# Patient Record
Sex: Female | Born: 1945 | Race: White | Hispanic: No | Smoking: Former smoker
Health system: Southern US, Community
[De-identification: ages and names within clinical notes are randomized; demographics above are authoritative.]

## PROBLEM LIST (undated history)

## (undated) DIAGNOSIS — I5022 Chronic systolic (congestive) heart failure: Secondary | ICD-10-CM

## (undated) DIAGNOSIS — T8859XA Other complications of anesthesia, initial encounter: Secondary | ICD-10-CM

## (undated) DIAGNOSIS — Z860100 Personal history of colon polyps, unspecified: Secondary | ICD-10-CM

## (undated) DIAGNOSIS — K8020 Calculus of gallbladder without cholecystitis without obstruction: Secondary | ICD-10-CM

## (undated) DIAGNOSIS — M62838 Other muscle spasm: Secondary | ICD-10-CM

## (undated) DIAGNOSIS — G8929 Other chronic pain: Secondary | ICD-10-CM

## (undated) DIAGNOSIS — C50912 Malignant neoplasm of unspecified site of left female breast: Secondary | ICD-10-CM

## (undated) DIAGNOSIS — G4733 Obstructive sleep apnea (adult) (pediatric): Secondary | ICD-10-CM

## (undated) DIAGNOSIS — M2550 Pain in unspecified joint: Secondary | ICD-10-CM

## (undated) DIAGNOSIS — I10 Essential (primary) hypertension: Secondary | ICD-10-CM

## (undated) DIAGNOSIS — E041 Nontoxic single thyroid nodule: Secondary | ICD-10-CM

## (undated) DIAGNOSIS — M8580 Other specified disorders of bone density and structure, unspecified site: Secondary | ICD-10-CM

## (undated) DIAGNOSIS — T4145XA Adverse effect of unspecified anesthetic, initial encounter: Secondary | ICD-10-CM

## (undated) DIAGNOSIS — E119 Type 2 diabetes mellitus without complications: Secondary | ICD-10-CM

## (undated) DIAGNOSIS — R351 Nocturia: Secondary | ICD-10-CM

## (undated) DIAGNOSIS — J189 Pneumonia, unspecified organism: Secondary | ICD-10-CM

## (undated) DIAGNOSIS — K219 Gastro-esophageal reflux disease without esophagitis: Secondary | ICD-10-CM

## (undated) DIAGNOSIS — I420 Dilated cardiomyopathy: Secondary | ICD-10-CM

## (undated) DIAGNOSIS — R0600 Dyspnea, unspecified: Secondary | ICD-10-CM

## (undated) DIAGNOSIS — J449 Chronic obstructive pulmonary disease, unspecified: Secondary | ICD-10-CM

## (undated) DIAGNOSIS — I4819 Other persistent atrial fibrillation: Secondary | ICD-10-CM

## (undated) DIAGNOSIS — M1990 Unspecified osteoarthritis, unspecified site: Secondary | ICD-10-CM

## (undated) DIAGNOSIS — J302 Other seasonal allergic rhinitis: Secondary | ICD-10-CM

## (undated) DIAGNOSIS — Z8709 Personal history of other diseases of the respiratory system: Secondary | ICD-10-CM

## (undated) DIAGNOSIS — E785 Hyperlipidemia, unspecified: Secondary | ICD-10-CM

## (undated) DIAGNOSIS — M549 Dorsalgia, unspecified: Secondary | ICD-10-CM

## (undated) HISTORY — DX: Personal history of colon polyps, unspecified: Z86.0100

## (undated) HISTORY — DX: Chronic systolic (congestive) heart failure: I50.22

## (undated) HISTORY — DX: Hyperlipidemia, unspecified: E78.5

## (undated) HISTORY — DX: Other specified disorders of bone density and structure, unspecified site: M85.80

## (undated) HISTORY — DX: Obstructive sleep apnea (adult) (pediatric): G47.33

## (undated) HISTORY — DX: Unspecified osteoarthritis, unspecified site: M19.90

## (undated) HISTORY — DX: Personal history of colonic polyps: Z86.010

## (undated) HISTORY — DX: Other persistent atrial fibrillation: I48.19

## (undated) HISTORY — PX: TUBAL LIGATION: SHX77

## (undated) HISTORY — PX: JOINT REPLACEMENT: SHX530

## (undated) HISTORY — PX: CATARACT EXTRACTION W/ INTRAOCULAR LENS  IMPLANT, BILATERAL: SHX1307

## (undated) HISTORY — DX: Calculus of gallbladder without cholecystitis without obstruction: K80.20

## (undated) HISTORY — PX: BACK SURGERY: SHX140

## (undated) HISTORY — DX: Dilated cardiomyopathy: I42.0

## (undated) HISTORY — DX: Essential (primary) hypertension: I10

## (undated) HISTORY — PX: COLONOSCOPY: SHX174

## (undated) HISTORY — DX: Chronic obstructive pulmonary disease, unspecified: J44.9

---

## 1999-06-04 ENCOUNTER — Other Ambulatory Visit: Admission: RE | Admit: 1999-06-04 | Discharge: 1999-06-04 | Payer: Self-pay

## 1999-10-13 ENCOUNTER — Other Ambulatory Visit: Admission: RE | Admit: 1999-10-13 | Discharge: 1999-10-13 | Payer: Self-pay | Admitting: Gynecology

## 2000-10-24 ENCOUNTER — Other Ambulatory Visit: Admission: RE | Admit: 2000-10-24 | Discharge: 2000-10-24 | Payer: Self-pay | Admitting: Gynecology

## 2000-10-25 DIAGNOSIS — C50912 Malignant neoplasm of unspecified site of left female breast: Secondary | ICD-10-CM

## 2000-10-25 HISTORY — DX: Malignant neoplasm of unspecified site of left female breast: C50.912

## 2000-10-25 HISTORY — PX: BREAST BIOPSY: SHX20

## 2000-10-25 HISTORY — PX: PORTA CATH INSERTION: CATH118285

## 2001-04-21 ENCOUNTER — Encounter: Admission: RE | Admit: 2001-04-21 | Discharge: 2001-04-21 | Payer: Self-pay | Admitting: Gynecology

## 2001-04-21 ENCOUNTER — Other Ambulatory Visit: Admission: RE | Admit: 2001-04-21 | Discharge: 2001-04-21 | Payer: Self-pay | Admitting: Gynecology

## 2001-04-21 ENCOUNTER — Encounter (INDEPENDENT_AMBULATORY_CARE_PROVIDER_SITE_OTHER): Payer: Self-pay | Admitting: Specialist

## 2001-04-21 ENCOUNTER — Encounter: Payer: Self-pay | Admitting: Gynecology

## 2001-04-24 ENCOUNTER — Other Ambulatory Visit: Admission: RE | Admit: 2001-04-24 | Discharge: 2001-04-24 | Payer: Self-pay | Admitting: Gynecology

## 2001-04-24 ENCOUNTER — Encounter (INDEPENDENT_AMBULATORY_CARE_PROVIDER_SITE_OTHER): Payer: Self-pay | Admitting: Specialist

## 2001-04-24 ENCOUNTER — Encounter: Payer: Self-pay | Admitting: Gynecology

## 2001-04-24 ENCOUNTER — Encounter: Admission: RE | Admit: 2001-04-24 | Discharge: 2001-04-24 | Payer: Self-pay | Admitting: Gynecology

## 2001-05-05 ENCOUNTER — Encounter: Admission: RE | Admit: 2001-05-05 | Discharge: 2001-05-05 | Payer: Self-pay | Admitting: General Surgery

## 2001-05-05 ENCOUNTER — Encounter: Payer: Self-pay | Admitting: General Surgery

## 2001-05-08 ENCOUNTER — Ambulatory Visit (HOSPITAL_BASED_OUTPATIENT_CLINIC_OR_DEPARTMENT_OTHER): Admission: RE | Admit: 2001-05-08 | Discharge: 2001-05-09 | Payer: Self-pay | Admitting: General Surgery

## 2001-05-08 ENCOUNTER — Encounter (INDEPENDENT_AMBULATORY_CARE_PROVIDER_SITE_OTHER): Payer: Self-pay | Admitting: *Deleted

## 2001-05-08 HISTORY — PX: MASTECTOMY PARTIAL / LUMPECTOMY W/ AXILLARY LYMPHADENECTOMY: SUR852

## 2001-05-17 ENCOUNTER — Ambulatory Visit: Admission: RE | Admit: 2001-05-17 | Discharge: 2001-05-24 | Payer: Self-pay | Admitting: Radiation Oncology

## 2001-05-25 ENCOUNTER — Ambulatory Visit: Admission: RE | Admit: 2001-05-25 | Discharge: 2001-08-23 | Payer: Self-pay | Admitting: Radiation Oncology

## 2001-05-29 ENCOUNTER — Encounter: Payer: Self-pay | Admitting: Hematology and Oncology

## 2001-05-29 ENCOUNTER — Ambulatory Visit (HOSPITAL_COMMUNITY): Admission: RE | Admit: 2001-05-29 | Discharge: 2001-05-29 | Payer: Self-pay | Admitting: Hematology and Oncology

## 2001-05-30 ENCOUNTER — Ambulatory Visit (HOSPITAL_COMMUNITY): Admission: RE | Admit: 2001-05-30 | Discharge: 2001-05-30 | Payer: Self-pay | Admitting: Hematology and Oncology

## 2001-05-30 ENCOUNTER — Encounter: Payer: Self-pay | Admitting: Hematology and Oncology

## 2001-05-31 ENCOUNTER — Encounter: Admission: RE | Admit: 2001-05-31 | Discharge: 2001-05-31 | Payer: Self-pay | Admitting: Hematology and Oncology

## 2001-05-31 ENCOUNTER — Encounter: Payer: Self-pay | Admitting: Hematology and Oncology

## 2001-06-19 ENCOUNTER — Encounter: Payer: Self-pay | Admitting: General Surgery

## 2001-06-19 ENCOUNTER — Ambulatory Visit (HOSPITAL_BASED_OUTPATIENT_CLINIC_OR_DEPARTMENT_OTHER): Admission: RE | Admit: 2001-06-19 | Discharge: 2001-06-19 | Payer: Self-pay | Admitting: General Surgery

## 2001-07-10 ENCOUNTER — Encounter: Payer: Self-pay | Admitting: Hematology and Oncology

## 2001-07-10 ENCOUNTER — Ambulatory Visit (HOSPITAL_COMMUNITY): Admission: RE | Admit: 2001-07-10 | Discharge: 2001-07-10 | Payer: Self-pay | Admitting: Hematology and Oncology

## 2001-07-11 ENCOUNTER — Ambulatory Visit (HOSPITAL_COMMUNITY): Admission: RE | Admit: 2001-07-11 | Discharge: 2001-07-11 | Payer: Self-pay | Admitting: Hematology and Oncology

## 2001-07-11 ENCOUNTER — Encounter: Payer: Self-pay | Admitting: Hematology and Oncology

## 2001-09-04 ENCOUNTER — Encounter: Payer: Self-pay | Admitting: Hematology and Oncology

## 2001-09-04 ENCOUNTER — Ambulatory Visit (HOSPITAL_COMMUNITY): Admission: RE | Admit: 2001-09-04 | Discharge: 2001-09-04 | Payer: Self-pay | Admitting: Hematology and Oncology

## 2001-09-07 ENCOUNTER — Ambulatory Visit: Admission: RE | Admit: 2001-09-07 | Discharge: 2001-12-06 | Payer: Self-pay | Admitting: Radiation Oncology

## 2001-09-14 ENCOUNTER — Encounter: Payer: Self-pay | Admitting: Hematology and Oncology

## 2001-09-14 ENCOUNTER — Ambulatory Visit (HOSPITAL_COMMUNITY): Admission: RE | Admit: 2001-09-14 | Discharge: 2001-09-14 | Payer: Self-pay | Admitting: Hematology and Oncology

## 2001-09-15 ENCOUNTER — Encounter: Admission: RE | Admit: 2001-09-15 | Discharge: 2001-09-15 | Payer: Self-pay | Admitting: Radiation Oncology

## 2001-09-20 ENCOUNTER — Encounter: Payer: Self-pay | Admitting: Radiation Oncology

## 2001-09-20 ENCOUNTER — Encounter (INDEPENDENT_AMBULATORY_CARE_PROVIDER_SITE_OTHER): Payer: Self-pay | Admitting: *Deleted

## 2001-09-20 ENCOUNTER — Encounter: Admission: RE | Admit: 2001-09-20 | Discharge: 2001-09-20 | Payer: Self-pay | Admitting: Radiation Oncology

## 2001-10-20 ENCOUNTER — Encounter: Admission: RE | Admit: 2001-10-20 | Discharge: 2001-10-20 | Payer: Self-pay | Admitting: Radiation Oncology

## 2001-10-25 HISTORY — PX: AXILLARY LYMPH NODE DISSECTION: SHX5229

## 2001-10-25 HISTORY — PX: PORT-A-CATH REMOVAL: SHX5289

## 2001-11-02 ENCOUNTER — Encounter: Payer: Self-pay | Admitting: General Surgery

## 2001-11-02 ENCOUNTER — Ambulatory Visit (HOSPITAL_BASED_OUTPATIENT_CLINIC_OR_DEPARTMENT_OTHER): Admission: RE | Admit: 2001-11-02 | Discharge: 2001-11-02 | Payer: Self-pay | Admitting: General Surgery

## 2001-11-02 ENCOUNTER — Encounter (INDEPENDENT_AMBULATORY_CARE_PROVIDER_SITE_OTHER): Payer: Self-pay | Admitting: Specialist

## 2001-11-02 ENCOUNTER — Encounter: Admission: RE | Admit: 2001-11-02 | Discharge: 2001-11-02 | Payer: Self-pay | Admitting: General Surgery

## 2001-11-07 ENCOUNTER — Other Ambulatory Visit: Admission: RE | Admit: 2001-11-07 | Discharge: 2001-11-07 | Payer: Self-pay | Admitting: Gynecology

## 2001-12-15 ENCOUNTER — Encounter: Admission: RE | Admit: 2001-12-15 | Discharge: 2002-01-04 | Payer: Self-pay | Admitting: Radiation Oncology

## 2002-01-10 ENCOUNTER — Ambulatory Visit: Admission: RE | Admit: 2002-01-10 | Discharge: 2002-01-10 | Payer: Self-pay | Admitting: Radiation Oncology

## 2002-01-17 ENCOUNTER — Ambulatory Visit (HOSPITAL_BASED_OUTPATIENT_CLINIC_OR_DEPARTMENT_OTHER): Admission: RE | Admit: 2002-01-17 | Discharge: 2002-01-17 | Payer: Self-pay | Admitting: Plastic Surgery

## 2002-01-17 ENCOUNTER — Encounter (INDEPENDENT_AMBULATORY_CARE_PROVIDER_SITE_OTHER): Payer: Self-pay | Admitting: *Deleted

## 2002-03-05 ENCOUNTER — Encounter: Admission: RE | Admit: 2002-03-05 | Discharge: 2002-03-05 | Payer: Self-pay | Admitting: General Surgery

## 2002-03-05 ENCOUNTER — Encounter: Payer: Self-pay | Admitting: General Surgery

## 2002-06-17 ENCOUNTER — Inpatient Hospital Stay (HOSPITAL_COMMUNITY): Admission: EM | Admit: 2002-06-17 | Discharge: 2002-06-19 | Payer: Self-pay | Admitting: Emergency Medicine

## 2002-06-17 ENCOUNTER — Encounter: Payer: Self-pay | Admitting: Internal Medicine

## 2002-07-13 ENCOUNTER — Encounter: Payer: Self-pay | Admitting: Internal Medicine

## 2002-09-17 ENCOUNTER — Encounter: Admission: RE | Admit: 2002-09-17 | Discharge: 2002-09-17 | Payer: Self-pay | Admitting: General Surgery

## 2002-09-17 ENCOUNTER — Encounter: Payer: Self-pay | Admitting: General Surgery

## 2002-10-25 HISTORY — PX: LAPAROSCOPIC CHOLECYSTECTOMY: SUR755

## 2002-12-26 ENCOUNTER — Encounter (INDEPENDENT_AMBULATORY_CARE_PROVIDER_SITE_OTHER): Payer: Self-pay | Admitting: Specialist

## 2002-12-26 ENCOUNTER — Ambulatory Visit (HOSPITAL_COMMUNITY): Admission: RE | Admit: 2002-12-26 | Discharge: 2002-12-27 | Payer: Self-pay

## 2003-01-14 ENCOUNTER — Other Ambulatory Visit: Admission: RE | Admit: 2003-01-14 | Discharge: 2003-01-14 | Payer: Self-pay | Admitting: Gynecology

## 2003-09-20 ENCOUNTER — Encounter: Admission: RE | Admit: 2003-09-20 | Discharge: 2003-09-20 | Payer: Self-pay | Admitting: Oncology

## 2004-01-15 ENCOUNTER — Other Ambulatory Visit: Admission: RE | Admit: 2004-01-15 | Discharge: 2004-01-15 | Payer: Self-pay | Admitting: Gynecology

## 2004-09-01 ENCOUNTER — Ambulatory Visit: Payer: Self-pay | Admitting: Internal Medicine

## 2004-09-04 ENCOUNTER — Ambulatory Visit: Payer: Self-pay | Admitting: Oncology

## 2004-09-21 ENCOUNTER — Encounter: Admission: RE | Admit: 2004-09-21 | Discharge: 2004-09-21 | Payer: Self-pay | Admitting: Gynecology

## 2004-10-25 HISTORY — PX: TOTAL KNEE ARTHROPLASTY: SHX125

## 2004-12-23 ENCOUNTER — Ambulatory Visit: Payer: Self-pay | Admitting: Internal Medicine

## 2005-01-19 ENCOUNTER — Other Ambulatory Visit: Admission: RE | Admit: 2005-01-19 | Discharge: 2005-01-19 | Payer: Self-pay | Admitting: Gynecology

## 2005-03-04 ENCOUNTER — Ambulatory Visit: Payer: Self-pay | Admitting: Oncology

## 2005-03-12 ENCOUNTER — Ambulatory Visit (HOSPITAL_COMMUNITY): Admission: RE | Admit: 2005-03-12 | Discharge: 2005-03-12 | Payer: Self-pay | Admitting: Oncology

## 2005-05-05 ENCOUNTER — Ambulatory Visit: Payer: Self-pay | Admitting: Internal Medicine

## 2005-05-11 ENCOUNTER — Ambulatory Visit: Payer: Self-pay | Admitting: Internal Medicine

## 2005-09-09 ENCOUNTER — Ambulatory Visit: Payer: Self-pay | Admitting: Internal Medicine

## 2005-09-21 ENCOUNTER — Ambulatory Visit: Payer: Self-pay | Admitting: Oncology

## 2005-09-23 ENCOUNTER — Inpatient Hospital Stay (HOSPITAL_COMMUNITY): Admission: RE | Admit: 2005-09-23 | Discharge: 2005-09-26 | Payer: Self-pay | Admitting: Orthopedic Surgery

## 2005-10-15 ENCOUNTER — Encounter: Admission: RE | Admit: 2005-10-15 | Discharge: 2005-10-15 | Payer: Self-pay | Admitting: Oncology

## 2005-11-22 ENCOUNTER — Encounter: Admission: RE | Admit: 2005-11-22 | Discharge: 2005-11-22 | Payer: Self-pay | Admitting: Oncology

## 2005-11-22 ENCOUNTER — Encounter (INDEPENDENT_AMBULATORY_CARE_PROVIDER_SITE_OTHER): Payer: Self-pay | Admitting: Specialist

## 2006-01-10 ENCOUNTER — Ambulatory Visit: Payer: Self-pay | Admitting: Internal Medicine

## 2006-02-02 ENCOUNTER — Other Ambulatory Visit: Admission: RE | Admit: 2006-02-02 | Discharge: 2006-02-02 | Payer: Self-pay | Admitting: Gynecology

## 2006-04-12 ENCOUNTER — Ambulatory Visit: Payer: Self-pay | Admitting: Oncology

## 2006-04-14 LAB — CBC WITH DIFFERENTIAL/PLATELET
BASO%: 0.5 % (ref 0.0–2.0)
Basophils Absolute: 0 10*3/uL (ref 0.0–0.1)
EOS%: 2.4 % (ref 0.0–7.0)
HCT: 38.5 % (ref 34.8–46.6)
HGB: 13.4 g/dL (ref 11.6–15.9)
MCH: 30 pg (ref 26.0–34.0)
MCHC: 34.7 g/dL (ref 32.0–36.0)
MCV: 86.5 fL (ref 81.0–101.0)
MONO%: 7.3 % (ref 0.0–13.0)
NEUT%: 67.3 % (ref 39.6–76.8)

## 2006-04-14 LAB — COMPREHENSIVE METABOLIC PANEL
ALT: 11 U/L (ref 0–40)
AST: 12 U/L (ref 0–37)
Alkaline Phosphatase: 101 U/L (ref 39–117)
BUN: 12 mg/dL (ref 6–23)
Creatinine, Ser: 0.77 mg/dL (ref 0.40–1.20)
Total Bilirubin: 0.6 mg/dL (ref 0.3–1.2)

## 2006-04-14 LAB — CANCER ANTIGEN 27.29: CA 27.29: 12 U/mL (ref 0–39)

## 2006-05-09 ENCOUNTER — Ambulatory Visit: Payer: Self-pay | Admitting: Internal Medicine

## 2006-05-17 ENCOUNTER — Ambulatory Visit: Payer: Self-pay | Admitting: Internal Medicine

## 2006-05-31 ENCOUNTER — Encounter: Admission: RE | Admit: 2006-05-31 | Discharge: 2006-05-31 | Payer: Self-pay | Admitting: General Surgery

## 2006-10-11 ENCOUNTER — Ambulatory Visit: Payer: Self-pay | Admitting: Oncology

## 2006-10-14 LAB — COMPREHENSIVE METABOLIC PANEL
ALT: 12 U/L (ref 0–35)
Albumin: 4.5 g/dL (ref 3.5–5.2)
Alkaline Phosphatase: 104 U/L (ref 39–117)
CO2: 29 mEq/L (ref 19–32)
Glucose, Bld: 117 mg/dL — ABNORMAL HIGH (ref 70–99)
Potassium: 3.6 mEq/L (ref 3.5–5.3)
Sodium: 141 mEq/L (ref 135–145)
Total Bilirubin: 0.4 mg/dL (ref 0.3–1.2)
Total Protein: 7 g/dL (ref 6.0–8.3)

## 2006-10-14 LAB — CBC WITH DIFFERENTIAL/PLATELET
BASO%: 0.6 % (ref 0.0–2.0)
Basophils Absolute: 0.1 10*3/uL (ref 0.0–0.1)
EOS%: 2.2 % (ref 0.0–7.0)
Eosinophils Absolute: 0.2 10*3/uL (ref 0.0–0.5)
HCT: 38.2 % (ref 34.8–46.6)
HGB: 13.1 g/dL (ref 11.6–15.9)
LYMPH%: 25.6 % (ref 14.0–48.0)
MCH: 30.1 pg (ref 26.0–34.0)
MCHC: 34.2 g/dL (ref 32.0–36.0)
MCV: 88 fL (ref 81.0–101.0)
MONO#: 0.7 10*3/uL (ref 0.1–0.9)
MONO%: 8.6 % (ref 0.0–13.0)
NEUT#: 5.4 10*3/uL (ref 1.5–6.5)
NEUT%: 63 % (ref 39.6–76.8)
Platelets: 409 10*3/uL — ABNORMAL HIGH (ref 145–400)
RBC: 4.34 10*6/uL (ref 3.70–5.32)
RDW: 12.5 % (ref 11.3–14.5)
WBC: 8.5 10*3/uL (ref 3.9–10.0)
lymph#: 2.2 10*3/uL (ref 0.9–3.3)

## 2006-10-14 LAB — CANCER ANTIGEN 27.29: CA 27.29: 10 U/mL (ref 0–39)

## 2006-10-14 LAB — LACTATE DEHYDROGENASE: LDH: 143 U/L (ref 94–250)

## 2006-10-19 ENCOUNTER — Encounter: Admission: RE | Admit: 2006-10-19 | Discharge: 2006-10-19 | Payer: Self-pay | Admitting: Oncology

## 2006-11-07 ENCOUNTER — Ambulatory Visit: Payer: Self-pay | Admitting: Internal Medicine

## 2007-02-07 ENCOUNTER — Other Ambulatory Visit: Admission: RE | Admit: 2007-02-07 | Discharge: 2007-02-07 | Payer: Self-pay | Admitting: Gynecology

## 2007-05-03 DIAGNOSIS — E785 Hyperlipidemia, unspecified: Secondary | ICD-10-CM | POA: Insufficient documentation

## 2007-05-03 DIAGNOSIS — J449 Chronic obstructive pulmonary disease, unspecified: Secondary | ICD-10-CM | POA: Insufficient documentation

## 2007-05-03 DIAGNOSIS — Z86010 Personal history of colonic polyps: Secondary | ICD-10-CM

## 2007-05-03 DIAGNOSIS — Z853 Personal history of malignant neoplasm of breast: Secondary | ICD-10-CM

## 2007-05-03 DIAGNOSIS — I10 Essential (primary) hypertension: Secondary | ICD-10-CM | POA: Insufficient documentation

## 2007-05-10 ENCOUNTER — Ambulatory Visit: Payer: Self-pay | Admitting: Internal Medicine

## 2007-05-10 LAB — CONVERTED CEMR LAB
ALT: 15 units/L (ref 0–35)
AST: 17 units/L (ref 0–37)
Albumin: 3.8 g/dL (ref 3.5–5.2)
Alkaline Phosphatase: 82 units/L (ref 39–117)
BUN: 15 mg/dL (ref 6–23)
Basophils Absolute: 0 10*3/uL (ref 0.0–0.1)
Basophils Relative: 0.4 % (ref 0.0–1.0)
Bilirubin, Direct: 0.1 mg/dL (ref 0.0–0.3)
CO2: 30 meq/L (ref 19–32)
Calcium: 9.3 mg/dL (ref 8.4–10.5)
Chloride: 106 meq/L (ref 96–112)
Cholesterol: 170 mg/dL (ref 0–200)
Creatinine, Ser: 0.6 mg/dL (ref 0.4–1.2)
Eosinophils Absolute: 0.2 10*3/uL (ref 0.0–0.6)
Eosinophils Relative: 2.7 % (ref 0.0–5.0)
GFR calc Af Amer: 131 mL/min
GFR calc non Af Amer: 108 mL/min
Glucose, Bld: 90 mg/dL (ref 70–99)
HCT: 38.4 % (ref 36.0–46.0)
HDL: 55.5 mg/dL (ref >39.0)
Hemoglobin: 13.3 g/dL (ref 12.0–15.0)
LDL Cholesterol: 92 mg/dL (ref 0–99)
Lymphocytes Relative: 23.5 % (ref 12.0–46.0)
MCHC: 34.7 g/dL (ref 30.0–36.0)
MCV: 88.7 fL (ref 78.0–100.0)
Monocytes Absolute: 0.6 10*3/uL (ref 0.2–0.7)
Monocytes Relative: 7.7 % (ref 3.0–11.0)
Neutro Abs: 4.9 10*3/uL (ref 1.4–7.7)
Neutrophils Relative %: 65.7 % (ref 43.0–77.0)
Platelets: 393 10*3/uL (ref 150–400)
Potassium: 3.8 meq/L (ref 3.5–5.1)
RBC: 4.33 M/uL (ref 3.87–5.11)
RDW: 12.4 % (ref 11.5–14.6)
Sodium: 142 meq/L (ref 135–145)
TSH: 0.88 microintl units/mL (ref 0.35–5.50)
Total Bilirubin: 0.8 mg/dL (ref 0.3–1.2)
Total CHOL/HDL Ratio: 3.1
Total Protein: 6.8 g/dL (ref 6.0–8.3)
Triglycerides: 111 mg/dL (ref 0–149)
VLDL: 22 mg/dL (ref 0–40)
WBC: 7.4 10*3/uL (ref 4.5–10.5)

## 2007-05-16 ENCOUNTER — Encounter (INDEPENDENT_AMBULATORY_CARE_PROVIDER_SITE_OTHER): Payer: Self-pay

## 2007-05-17 ENCOUNTER — Ambulatory Visit: Payer: Self-pay | Admitting: Internal Medicine

## 2007-05-17 DIAGNOSIS — M8580 Other specified disorders of bone density and structure, unspecified site: Secondary | ICD-10-CM

## 2007-07-11 ENCOUNTER — Ambulatory Visit: Payer: Self-pay | Admitting: Internal Medicine

## 2007-07-25 ENCOUNTER — Encounter: Payer: Self-pay | Admitting: Internal Medicine

## 2007-07-25 ENCOUNTER — Ambulatory Visit: Payer: Self-pay | Admitting: Internal Medicine

## 2007-10-11 ENCOUNTER — Ambulatory Visit: Payer: Self-pay | Admitting: Oncology

## 2007-10-13 LAB — COMPREHENSIVE METABOLIC PANEL
AST: 13 U/L (ref 0–37)
Albumin: 4.3 g/dL (ref 3.5–5.2)
Alkaline Phosphatase: 97 U/L (ref 39–117)
Potassium: 3.9 mEq/L (ref 3.5–5.3)
Sodium: 139 mEq/L (ref 135–145)
Total Bilirubin: 0.6 mg/dL (ref 0.3–1.2)
Total Protein: 7.2 g/dL (ref 6.0–8.3)

## 2007-10-13 LAB — CBC WITH DIFFERENTIAL/PLATELET
EOS%: 2.6 % (ref 0.0–7.0)
LYMPH%: 29.4 % (ref 14.0–48.0)
MCH: 29.4 pg (ref 26.0–34.0)
MCHC: 34.4 g/dL (ref 32.0–36.0)
MCV: 85.3 fL (ref 81.0–101.0)
MONO%: 4.9 % (ref 0.0–13.0)
Platelets: 379 10*3/uL (ref 145–400)
RBC: 4.35 10*6/uL (ref 3.70–5.32)
RDW: 10.4 % — ABNORMAL LOW (ref 11.3–14.5)

## 2007-10-13 LAB — CANCER ANTIGEN 27.29: CA 27.29: 12 U/mL (ref 0–39)

## 2007-10-20 ENCOUNTER — Encounter: Payer: Self-pay | Admitting: Internal Medicine

## 2007-10-23 ENCOUNTER — Encounter: Admission: RE | Admit: 2007-10-23 | Discharge: 2007-10-23 | Payer: Self-pay | Admitting: Oncology

## 2007-11-28 ENCOUNTER — Ambulatory Visit: Payer: Self-pay | Admitting: Internal Medicine

## 2007-12-06 ENCOUNTER — Encounter: Payer: Self-pay | Admitting: Internal Medicine

## 2007-12-06 ENCOUNTER — Ambulatory Visit: Payer: Self-pay

## 2007-12-11 ENCOUNTER — Telehealth: Payer: Self-pay | Admitting: Internal Medicine

## 2008-02-28 ENCOUNTER — Ambulatory Visit: Payer: Self-pay | Admitting: Internal Medicine

## 2008-02-28 DIAGNOSIS — M549 Dorsalgia, unspecified: Secondary | ICD-10-CM

## 2008-02-28 DIAGNOSIS — G8929 Other chronic pain: Secondary | ICD-10-CM

## 2008-03-03 ENCOUNTER — Encounter: Admission: RE | Admit: 2008-03-03 | Discharge: 2008-03-03 | Payer: Self-pay | Admitting: Internal Medicine

## 2008-03-06 ENCOUNTER — Encounter: Payer: Self-pay | Admitting: Internal Medicine

## 2008-04-30 ENCOUNTER — Encounter: Payer: Self-pay | Admitting: Internal Medicine

## 2008-05-17 ENCOUNTER — Ambulatory Visit: Payer: Self-pay | Admitting: Internal Medicine

## 2008-05-17 LAB — CONVERTED CEMR LAB
ALT: 19 units/L (ref 0–35)
AST: 19 units/L (ref 0–37)
Albumin: 3.9 g/dL (ref 3.5–5.2)
Alkaline Phosphatase: 93 units/L (ref 39–117)
BUN: 14 mg/dL (ref 6–23)
Basophils Absolute: 0 10*3/uL (ref 0.0–0.1)
Basophils Relative: 0.4 % (ref 0.0–3.0)
Bilirubin Urine: NEGATIVE
Bilirubin, Direct: 0.1 mg/dL (ref 0.0–0.3)
CO2: 28 meq/L (ref 19–32)
Calcium: 9.4 mg/dL (ref 8.4–10.5)
Chloride: 103 meq/L (ref 96–112)
Cholesterol: 167 mg/dL (ref 0–200)
Creatinine, Ser: 0.7 mg/dL (ref 0.4–1.2)
Eosinophils Absolute: 0.2 10*3/uL (ref 0.0–0.7)
Eosinophils Relative: 2 % (ref 0.0–5.0)
GFR calc Af Amer: 109 mL/min
GFR calc non Af Amer: 90 mL/min
Glucose, Bld: 116 mg/dL — ABNORMAL HIGH (ref 70–99)
Glucose, Urine, Semiquant: NEGATIVE
HCT: 40.5 % (ref 36.0–46.0)
HDL: 46.7 mg/dL (ref >39.0)
Hemoglobin: 13.6 g/dL (ref 12.0–15.0)
Ketones, urine, test strip: NEGATIVE
LDL Cholesterol: 102 mg/dL — ABNORMAL HIGH (ref 0–99)
Lymphocytes Relative: 24 % (ref 12.0–46.0)
MCHC: 33.5 g/dL (ref 30.0–36.0)
MCV: 90.2 fL (ref 78.0–100.0)
Monocytes Absolute: 0.7 10*3/uL (ref 0.1–1.0)
Monocytes Relative: 8.3 % (ref 3.0–12.0)
Neutro Abs: 5.8 10*3/uL (ref 1.4–7.7)
Neutrophils Relative %: 65.3 % (ref 43.0–77.0)
Nitrite: NEGATIVE
Platelets: 356 10*3/uL (ref 150–400)
Potassium: 3.8 meq/L (ref 3.5–5.1)
RBC: 4.49 M/uL (ref 3.87–5.11)
RDW: 12.7 % (ref 11.5–14.6)
Sodium: 143 meq/L (ref 135–145)
Specific Gravity, Urine: >=1.03
TSH: 1.28 microintl units/mL (ref 0.35–5.50)
Total Bilirubin: 0.8 mg/dL (ref 0.3–1.2)
Total CHOL/HDL Ratio: 3.6
Total Protein: 7.1 g/dL (ref 6.0–8.3)
Triglycerides: 93 mg/dL (ref 0–149)
Urobilinogen, UA: 0.2
VLDL: 19 mg/dL (ref 0–40)
WBC: 8.8 10*3/uL (ref 4.5–10.5)
pH: 5.5

## 2008-05-23 ENCOUNTER — Encounter: Payer: Self-pay | Admitting: Internal Medicine

## 2008-05-24 ENCOUNTER — Ambulatory Visit: Payer: Self-pay | Admitting: Internal Medicine

## 2008-07-04 ENCOUNTER — Telehealth: Payer: Self-pay | Admitting: Internal Medicine

## 2008-07-05 ENCOUNTER — Ambulatory Visit: Payer: Self-pay | Admitting: Internal Medicine

## 2008-07-05 DIAGNOSIS — J069 Acute upper respiratory infection, unspecified: Secondary | ICD-10-CM | POA: Insufficient documentation

## 2008-08-14 ENCOUNTER — Ambulatory Visit: Payer: Self-pay | Admitting: Internal Medicine

## 2008-10-23 ENCOUNTER — Encounter: Admission: RE | Admit: 2008-10-23 | Discharge: 2008-10-23 | Payer: Self-pay | Admitting: Oncology

## 2008-10-24 ENCOUNTER — Encounter: Admission: RE | Admit: 2008-10-24 | Discharge: 2008-10-24 | Payer: Self-pay | Admitting: Oncology

## 2008-11-20 ENCOUNTER — Ambulatory Visit: Payer: Self-pay | Admitting: Oncology

## 2008-12-10 LAB — CBC WITH DIFFERENTIAL/PLATELET
Eosinophils Absolute: 0.1 10*3/uL (ref 0.0–0.5)
MONO#: 0.5 10*3/uL (ref 0.1–0.9)
MONO%: 8.2 % (ref 0.0–13.0)
NEUT#: 4.6 10*3/uL (ref 1.5–6.5)
RBC: 4.48 10*6/uL (ref 3.70–5.32)
RDW: 13 % (ref 11.3–14.5)
WBC: 6.6 10*3/uL (ref 3.9–10.0)
lymph#: 1.3 10*3/uL (ref 0.9–3.3)

## 2008-12-11 LAB — COMPREHENSIVE METABOLIC PANEL
Albumin: 4.4 g/dL (ref 3.5–5.2)
Alkaline Phosphatase: 92 U/L (ref 39–117)
CO2: 26 mEq/L (ref 19–32)
Glucose, Bld: 117 mg/dL — ABNORMAL HIGH (ref 70–99)
Potassium: 3.5 mEq/L (ref 3.5–5.3)
Sodium: 140 mEq/L (ref 135–145)
Total Protein: 7 g/dL (ref 6.0–8.3)

## 2008-12-11 LAB — CANCER ANTIGEN 27.29: CA 27.29: 17 U/mL (ref 0–39)

## 2008-12-17 ENCOUNTER — Encounter: Payer: Self-pay | Admitting: Internal Medicine

## 2009-05-22 ENCOUNTER — Ambulatory Visit: Payer: Self-pay | Admitting: Internal Medicine

## 2009-05-22 LAB — CONVERTED CEMR LAB
ALT: 16 units/L (ref 0–35)
AST: 19 units/L (ref 0–37)
Albumin: 4 g/dL (ref 3.5–5.2)
Alkaline Phosphatase: 86 units/L (ref 39–117)
BUN: 11 mg/dL (ref 6–23)
Basophils Absolute: 0.1 10*3/uL (ref 0.0–0.1)
Basophils Relative: 0.9 % (ref 0.0–3.0)
Bilirubin, Direct: 0 mg/dL (ref 0.0–0.3)
CO2: 30 meq/L (ref 19–32)
Calcium: 9.4 mg/dL (ref 8.4–10.5)
Chloride: 104 meq/L (ref 96–112)
Cholesterol: 186 mg/dL (ref 0–200)
Creatinine, Ser: 0.5 mg/dL (ref 0.4–1.2)
Eosinophils Absolute: 0.2 10*3/uL (ref 0.0–0.7)
Eosinophils Relative: 3.3 % (ref 0.0–5.0)
GFR calc non Af Amer: 132.18 mL/min (ref >60)
Glucose, Bld: 100 mg/dL — ABNORMAL HIGH (ref 70–99)
HCT: 38.4 % (ref 36.0–46.0)
HDL: 42.8 mg/dL (ref >39.00)
Hemoglobin: 13.1 g/dL (ref 12.0–15.0)
LDL Cholesterol: 118 mg/dL — ABNORMAL HIGH (ref 0–99)
Lymphocytes Relative: 27.2 % (ref 12.0–46.0)
Lymphs Abs: 1.9 10*3/uL (ref 0.7–4.0)
MCHC: 34.2 g/dL (ref 30.0–36.0)
MCV: 89.6 fL (ref 78.0–100.0)
Monocytes Absolute: 0.5 10*3/uL (ref 0.1–1.0)
Monocytes Relative: 7.4 % (ref 3.0–12.0)
Neutro Abs: 4.2 10*3/uL (ref 1.4–7.7)
Neutrophils Relative %: 61.2 % (ref 43.0–77.0)
Platelets: 357 10*3/uL (ref 150.0–400.0)
Potassium: 3.8 meq/L (ref 3.5–5.1)
RBC: 4.29 M/uL (ref 3.87–5.11)
RDW: 12.7 % (ref 11.5–14.6)
Sodium: 140 meq/L (ref 135–145)
TSH: 1 microintl units/mL (ref 0.35–5.50)
Total Bilirubin: 0.7 mg/dL (ref 0.3–1.2)
Total CHOL/HDL Ratio: 4
Total Protein: 7.1 g/dL (ref 6.0–8.3)
Triglycerides: 124 mg/dL (ref 0.0–149.0)
VLDL: 24.8 mg/dL (ref 0.0–40.0)
WBC: 6.9 10*3/uL (ref 4.5–10.5)

## 2009-05-29 ENCOUNTER — Ambulatory Visit: Payer: Self-pay | Admitting: Internal Medicine

## 2009-10-29 ENCOUNTER — Encounter: Admission: RE | Admit: 2009-10-29 | Discharge: 2009-10-29 | Payer: Self-pay | Admitting: Oncology

## 2009-12-08 ENCOUNTER — Ambulatory Visit: Payer: Self-pay | Admitting: Oncology

## 2009-12-10 LAB — CBC WITH DIFFERENTIAL/PLATELET
BASO%: 0.7 % (ref 0.0–2.0)
EOS%: 2.5 % (ref 0.0–7.0)
MCH: 30.6 pg (ref 25.1–34.0)
MCHC: 34.4 g/dL (ref 31.5–36.0)
MCV: 89 fL (ref 79.5–101.0)
MONO%: 7.1 % (ref 0.0–14.0)
RBC: 4.19 10*6/uL (ref 3.70–5.45)
RDW: 12.6 % (ref 11.2–14.5)
lymph#: 1.4 10*3/uL (ref 0.9–3.3)

## 2009-12-10 LAB — COMPREHENSIVE METABOLIC PANEL
ALT: 15 U/L (ref 0–35)
AST: 15 U/L (ref 0–37)
Albumin: 4.3 g/dL (ref 3.5–5.2)
Alkaline Phosphatase: 77 U/L (ref 39–117)
BUN: 18 mg/dL (ref 6–23)
Chloride: 105 mEq/L (ref 96–112)
Potassium: 3.7 mEq/L (ref 3.5–5.3)
Sodium: 141 mEq/L (ref 135–145)

## 2009-12-10 LAB — VITAMIN D 25 HYDROXY (VIT D DEFICIENCY, FRACTURES): Vit D, 25-Hydroxy: 28 ng/mL — ABNORMAL LOW (ref 30–89)

## 2010-01-06 ENCOUNTER — Encounter: Payer: Self-pay | Admitting: Internal Medicine

## 2010-01-08 ENCOUNTER — Ambulatory Visit: Payer: Self-pay | Admitting: Internal Medicine

## 2010-01-08 DIAGNOSIS — M549 Dorsalgia, unspecified: Secondary | ICD-10-CM | POA: Insufficient documentation

## 2010-03-11 ENCOUNTER — Telehealth: Payer: Self-pay | Admitting: Internal Medicine

## 2010-05-27 ENCOUNTER — Ambulatory Visit: Payer: Self-pay | Admitting: Internal Medicine

## 2010-05-27 LAB — CONVERTED CEMR LAB
ALT: 17 units/L (ref 0–35)
AST: 17 units/L (ref 0–37)
Albumin: 4.1 g/dL (ref 3.5–5.2)
Alkaline Phosphatase: 84 units/L (ref 39–117)
BUN: 20 mg/dL (ref 6–23)
Basophils Absolute: 0 10*3/uL (ref 0.0–0.1)
Basophils Relative: 0.6 % (ref 0.0–3.0)
Bilirubin Urine: NEGATIVE
Bilirubin, Direct: 0.2 mg/dL (ref 0.0–0.3)
CO2: 29 meq/L (ref 19–32)
Calcium: 9.7 mg/dL (ref 8.4–10.5)
Chloride: 104 meq/L (ref 96–112)
Cholesterol: 152 mg/dL (ref 0–200)
Creatinine, Ser: 0.7 mg/dL (ref 0.4–1.2)
Eosinophils Absolute: 0.2 10*3/uL (ref 0.0–0.7)
Eosinophils Relative: 2.8 % (ref 0.0–5.0)
GFR calc non Af Amer: 85.13 mL/min (ref >60)
Glucose, Bld: 90 mg/dL (ref 70–99)
Glucose, Urine, Semiquant: NEGATIVE
HCT: 39.2 % (ref 36.0–46.0)
HDL: 47.8 mg/dL (ref >39.00)
Hemoglobin: 13.4 g/dL (ref 12.0–15.0)
Ketones, urine, test strip: NEGATIVE
LDL Cholesterol: 81 mg/dL (ref 0–99)
Lymphocytes Relative: 23.2 % (ref 12.0–46.0)
Lymphs Abs: 1.9 10*3/uL (ref 0.7–4.0)
MCHC: 34 g/dL (ref 30.0–36.0)
MCV: 90.1 fL (ref 78.0–100.0)
Monocytes Absolute: 0.7 10*3/uL (ref 0.1–1.0)
Monocytes Relative: 8.4 % (ref 3.0–12.0)
Neutro Abs: 5.3 10*3/uL (ref 1.4–7.7)
Neutrophils Relative %: 65 % (ref 43.0–77.0)
Nitrite: NEGATIVE
Platelets: 326 10*3/uL (ref 150.0–400.0)
Potassium: 4 meq/L (ref 3.5–5.1)
Protein, U semiquant: NEGATIVE
RBC: 4.35 M/uL (ref 3.87–5.11)
RDW: 13.6 % (ref 11.5–14.6)
Sodium: 143 meq/L (ref 135–145)
Specific Gravity, Urine: >=1.03
TSH: 0.7 microintl units/mL (ref 0.35–5.50)
Total Bilirubin: 0.8 mg/dL (ref 0.3–1.2)
Total CHOL/HDL Ratio: 3
Total Protein: 6.8 g/dL (ref 6.0–8.3)
Triglycerides: 116 mg/dL (ref 0.0–149.0)
Urobilinogen, UA: 0.2
VLDL: 23.2 mg/dL (ref 0.0–40.0)
WBC: 8.1 10*3/uL (ref 4.5–10.5)
pH: 5

## 2010-06-11 ENCOUNTER — Ambulatory Visit: Payer: Self-pay | Admitting: Internal Medicine

## 2010-07-15 ENCOUNTER — Encounter (INDEPENDENT_AMBULATORY_CARE_PROVIDER_SITE_OTHER): Payer: Self-pay | Admitting: *Deleted

## 2010-10-28 ENCOUNTER — Encounter
Admission: RE | Admit: 2010-10-28 | Discharge: 2010-10-28 | Payer: Self-pay | Source: Home / Self Care | Attending: Oncology | Admitting: Oncology

## 2010-11-14 ENCOUNTER — Encounter: Payer: Self-pay | Admitting: Gynecology

## 2010-11-26 NOTE — Letter (Signed)
[facility]   Imported By: Maryln Gottron 02/06/2010 12:14:31  _____________________________________________________________________  External Attachment:    Type:   Image     Comment:   External Document

## 2010-11-26 NOTE — Letter (Signed)
Summary: Colonoscopy Letter  [facility]  [address] [place], [place] [address]   Phone: [phone]  Fax: [phone]      July 15, 2010 MRN: [id number]   Cynthia Herrera [address] [place], [place]  [address]   Dear Ms. Ke,   According to your medical record, it is time for you to schedule a Colonoscopy. The American Cancer Society recommends this procedure as a method to detect early colon cancer. Patients with a family history of colon cancer, or a personal history of colon polyps or inflammatory bowel disease are at increased risk.  This letter has beeen generated based on the recommendations made at the time of your procedure. If you feel that in your particular situation this may no longer apply, please contact our office.  Please call our office at [phone] to schedule this appointment or to update your records at your earliest convenience.  Thank you for cooperating with [place] to provide you with the very best care possible.   Sincerely,   Iva Boop, M.D.  [facility] Gastroenterology Division [phone]

## 2010-11-26 NOTE — Progress Notes (Signed)
Summary: sinus  Phone Note Call from Patient Call back at [facility] Phone [phone] Call back at [facility]   Summary of Call: Headache, sweating, thinks she may have fever, but hasn't checked temp.  Blowing yellow to clear mucus some.  No cough.  Has been using Allegra D over a week.  Suggested Mucinex, Saline NS or netty pot.  Hasn't been able to visit mother in nursing home for over a week.  [facility]  NKDA.   Initial call taken by: Rudy Jew, RN,  Mar 11, 2010 1:47 PM  Follow-up for Phone Call        agree; add afrin  nasal spray two times a day for 3-5 days only Follow-up by: Gordy Savers  MD,  Mar 12, 2010 8:09 AM  Additional Follow-up for Phone Call Additional follow up Details #1::        Pt. notified. Additional Follow-up by: Lynann Beaver CMA,  Mar 12, 2010 8:18 AM

## 2010-11-26 NOTE — Assessment & Plan Note (Signed)
Summary: BACK  PAIN // RS   Vital Signs:  Patient profile:   65 year old female Weight:      234 pounds Temp:     97.6 degrees F oral BP sitting:   142 / 76  (left arm) Cuff size:   regular  Vitals Entered By: Duard Brady LPN (January 08, 2010 10:26 AM) CC: c/o back pain, no injury/fall Is Patient Diabetic? No   CC:  c/o back pain and no injury/fall.  History of Present Illness: 65 year old patient who is in today for follow-up.  She has a history of hypertension, untreated dyslipidemia.  In the past 4 days.  She has had some left mid back discomfort.  Pain is aggravated by sitting and twisting.  She has had some mild low back pain in the past with some radiation down the left leg.  She has a remote history of breast cancer.  Preventive Screening-Counseling & Management  Alcohol-Tobacco     Smoking Status: never  Allergies: 1)  ! Codeine Phosphate (Codeine Phosphate)  Past History:  Past Medical History: Reviewed history from 05/29/2009 and no changes required. Colonic polyps, hx of COPD Hyperlipidemia Hypertension Breast cancer, hx of Osteopenia Vitamin D deficiency  Social History: Smoking Status:  never  Physical Exam  General:  normal blood pressureoverweight-appearing.   Lungs:  Normal respiratory effort, chest expands symmetrically. Lungs are clear to auscultation, no crackles or wheezes. Heart:  Normal rate and regular rhythm. S1 and S2 normal without gallop, murmur, click, rub or other extra sounds. Msk:  slight tenderness in the left mid back area.  Straight leg testing was negative.  No motor weakness.  Reflexes intact   Impression & Recommendations:  Problem # 1:  BACK PAIN (ICD-724.5)  Her updated medication list for this problem includes:    Ibuprofen 800 Mg Tabs (Ibuprofen) ..... One once daily    Cyclobenzaprine Hcl 10 Mg Tabs (Cyclobenzaprine hcl) ..... One tablet 3  times daily as needed    Tramadol Hcl 50 Mg Tabs (Tramadol hcl) .....  One every 6 hours as needed for pain  Her updated medication list for this problem includes:    Ibuprofen 800 Mg Tabs (Ibuprofen) ..... One once daily    Cyclobenzaprine Hcl 10 Mg Tabs (Cyclobenzaprine hcl) ..... One tablet 3  times daily as needed    Tramadol Hcl 50 Mg Tabs (Tramadol hcl) ..... One every 6 hours as needed for pain  Problem # 2:  BREAST CANCER, HX OF (ICD-V10.3)  Problem # 3:  HYPERTENSION (ICD-401.9)  Her updated medication list for this problem includes:    Diovan Hct 160-25 Mg Tabs (Valsartan-hydrochlorothiazide) .Marland Kitchen... 1 once daily  Her updated medication list for this problem includes:    Diovan Hct 160-25 Mg Tabs (Valsartan-hydrochlorothiazide) .Marland Kitchen... 1 once daily  Complete Medication List: 1)  Simvastatin 40 Mg Tabs (Simvastatin) .Marland Kitchen.. 1 once daily 2)  Diovan Hct 160-25 Mg Tabs (Valsartan-hydrochlorothiazide) .Marland Kitchen.. 1 once daily 3)  Calcium Carbonate 600 Mg Tabs (Calcium carbonate) .... Two once daily 4)  Ibuprofen 800 Mg Tabs (Ibuprofen) .... One once daily 5)  Allegra-d 12 Hour 60-120 Mg Tb12 (Fexofenadine-pseudoephedrine) .... One two times a day 6)  Multivitamins Tabs (Multiple vitamin) .... Once daily 7)  Prednisone 20 Mg Tabs (Prednisone) .... One twice daily 8)  Cyclobenzaprine Hcl 10 Mg Tabs (Cyclobenzaprine hcl) .... One tablet 3  times daily as needed 9)  Tramadol Hcl 50 Mg Tabs (Tramadol hcl) .... One every 6 hours as needed  for pain  Patient Instructions: 1)  Most patients (90%) with low back pain will improve with time (2-6 weeks). Keep active but avoid activities that are painful. Apply moist heat  to lower back several times a day. 2)  Please schedule a follow-up appointment in 4 months for annual exam Prescriptions: TRAMADOL HCL 50 MG TABS (TRAMADOL HCL) one every 6 hours as needed for pain  #50 x 0   Entered and Authorized by:   Gordy Savers  MD   Signed by:   Gordy Savers  MD on 01/08/2010   Method used:   Print then Give to  Patient   RxID:   [id number] CYCLOBENZAPRINE HCL 10 MG TABS (CYCLOBENZAPRINE HCL) one tablet 3  times daily as needed  #30 x 0   Entered and Authorized by:   Gordy Savers  MD   Signed by:   Gordy Savers  MD on 01/08/2010   Method used:   Print then Give to Patient   RxID:   [id number] PREDNISONE 20 MG TABS (PREDNISONE) one twice daily  #14 x 0   Entered and Authorized by:   Gordy Savers  MD   Signed by:   Gordy Savers  MD on 01/08/2010   Method used:   Print then Give to Patient   RxID:   [id number]

## 2010-11-26 NOTE — Assessment & Plan Note (Signed)
Summary: cpx//ccm/pt rescd from bump//ccm   Vital Signs:  Patient profile:   65 year old female Height:      65.75 inches Weight:      238 pounds BMI:     38.85 Temp:     98.1 degrees F oral BP sitting:   140 / 80  (left arm) Cuff size:   large  Vitals Entered By: Kathrynn Speed CMA (June 11, 2010 2:02 PM) CC: cpx, review labs, src   CC:  cpx, review labs, and src.  History of Present Illness: 65 year old patient who is seen today for a wellness exam.  She has a history of breast cancer in 2002.  She has hypertension, dyslipidemia, and a history of COPD.  She has done quite well.  She has a history of colonic polyps.  Current Medications (verified): 1)  Simvastatin 40 Mg Tabs (Simvastatin) .Marland Kitchen.. 1 Once Daily 2)  Diovan Hct 160-25 Mg  Tabs (Valsartan-Hydrochlorothiazide) .Marland Kitchen.. 1 Once Daily 3)  Calcium Carbonate 600 Mg  Tabs (Calcium Carbonate) .... Two Once Daily 4)  Ibuprofen 800 Mg  Tabs (Ibuprofen) .... One Once Daily 5)  Allegra-D 12 Hour 60-120 Mg  Tb12 (Fexofenadine-Pseudoephedrine) .... One Two Times A Day 6)  Multivitamins  Tabs (Multiple Vitamin) .... Once Daily  Allergies (verified): 1)  ! Codeine Phosphate (Codeine Phosphate)  Past History:  Past Medical History: Reviewed history from 05/29/2009 and no changes required. Colonic polyps, hx of COPD Hyperlipidemia Hypertension Breast cancer, hx of Osteopenia Vitamin D deficiency  Past Surgical History: Reviewed history from 05/24/2008 and no changes required. Mastectomy-Lt July 2002 Cholecystectomy-2004 Colonoscopy-07/13/2002, September 2008 Total knee replacement Tubal ligation gravida two, para two, abortus zero  Family History: Reviewed history from 05/29/2009 and no changes required. Family History Diabetes 1st degree relative Fam hx MI Family History Other cancer-Father died at 64 of an MI;  mother deceased from pancreatic cancer.  Two brothers;  one  deceased.  One status post CABG;  one sister.-  DM and dementia;   History DVT.  Both parents.  Both brothers and one sister with diabetes;   Social History: Reviewed history from 05/24/2008 and no changes required. Former Smoker Married presently unemployed  Review of Systems       The patient complains of weight gain.  The patient denies anorexia, fever, weight loss, vision loss, decreased hearing, hoarseness, chest pain, syncope, dyspnea on exertion, peripheral edema, prolonged cough, headaches, hemoptysis, abdominal pain, melena, hematochezia, severe indigestion/heartburn, hematuria, incontinence, genital sores, muscle weakness, suspicious skin lesions, transient blindness, difficulty walking, depression, unusual weight change, abnormal bleeding, enlarged lymph nodes, angioedema, and breast masses.    Physical Exam  General:  overweight-appearing.  130/78overweight-appearing.   Head:  Normocephalic and atraumatic without obvious abnormalities. No apparent alopecia or balding. Eyes:  No corneal or conjunctival inflammation noted. EOMI. Perrla. Funduscopic exam benign, without hemorrhages, exudates or papilledema. Vision grossly normal. Ears:  External ear exam shows no significant lesions or deformities.  Otoscopic examination reveals clear canals, tympanic membranes are intact bilaterally without bulging, retraction, inflammation or discharge. Hearing is grossly normal bilaterally. Nose:  External nasal examination shows no deformity or inflammation. Nasal mucosa are pink and moist without lesions or exudates. Mouth:  Oral mucosa and oropharynx without lesions or exudates.  Teeth in good repair. Neck:  No deformities, masses, or tenderness noted. Chest Wall:  No deformities, masses, or tenderness noted. Breasts:  No mass, nodules, thickening, tenderness, bulging, retraction, inflamation, nipple discharge or skin changes noted.  status post  left lumpectomy  Lungs:  Normal respiratory effort, chest expands symmetrically. Lungs are clear  to auscultation, no crackles or wheezes. Heart:  Normal rate and regular rhythm. S1 and S2 normal without gallop, murmur, click, rub or other extra sounds. Abdomen:  obese, soft, nontender, no organomegaly or masses.  Bowel sounds normal. Rectal:  per GYN Genitalia:  per GYN Msk:  No deformity or scoliosis noted of thoracic or lumbar spine.   Pulses:  R and L carotid,radial,femoral,dorsalis pedis and posterior tibial pulses are full and equal bilaterally Extremities:  No clubbing, cyanosis, edema, or deformity noted with normal full range of motion of all joints.   Neurologic:  No cranial nerve deficits noted. Station and gait are normal. Plantar reflexes are down-going bilaterally. DTRs are symmetrical throughout. Sensory, motor and coordinative functions appear intact. Skin:  Intact without suspicious lesions or rashes Cervical Nodes:  No lymphadenopathy noted Axillary Nodes:  No palpable lymphadenopathy Inguinal Nodes:  No significant adenopathy Psych:  Cognition and judgment appear intact. Alert and cooperative with normal attention span and concentration. No apparent delusions, illusions, hallucinations   Impression & Recommendations:  Problem # 1:  Preventive Health Care (ICD-V70.0)  Complete Medication List: 1)  Simvastatin 40 Mg Tabs (Simvastatin) .Marland Kitchen.. 1 once daily 2)  Diovan Hct 160-25 Mg Tabs (Valsartan-hydrochlorothiazide) .Marland Kitchen.. 1 once daily 3)  Calcium Carbonate 600 Mg Tabs (Calcium carbonate) .... Two once daily 4)  Ibuprofen 800 Mg Tabs (Ibuprofen) .... One once daily 5)  Allegra-d 12 Hour 60-120 Mg Tb12 (Fexofenadine-pseudoephedrine) .... One two times a day 6)  Multivitamins Tabs (Multiple vitamin) .... Once daily  Patient Instructions: 1)  Please schedule a follow-up appointment in 6 months. 2)  Limit your Sodium (Salt) to less than 2 grams a day(slightly less than 1/2 a teaspoon) to prevent fluid retention, swelling, or worsening of symptoms. 3)  It is important that  you exercise regularly at least 20 minutes 5 times a week. If you develop chest pain, have severe difficulty breathing, or feel very tired , stop exercising immediately and seek medical attention. 4)  You need to lose weight. Consider a lower calorie diet and regular exercise.  5)  Take calcium +Vitamin D daily. 6)  Check your Blood Pressure regularly. If it is above: 150/90  you should make an appointment. Prescriptions: IBUPROFEN 800 MG  TABS (IBUPROFEN) one once daily  #90 Tablet x 6   Entered and Authorized by:   Gordy Savers  MD   Signed by:   Gordy Savers  MD on 06/11/2010   Method used:   Print then Give to Patient   RxID:   [id number] DIOVAN HCT 160-25 MG  TABS (VALSARTAN-HYDROCHLOROTHIAZIDE) 1 once daily  #90 x 6   Entered and Authorized by:   Gordy Savers  MD   Signed by:   Gordy Savers  MD on 06/11/2010   Method used:   Print then Give to Patient   RxID:   [id number] SIMVASTATIN 40 MG TABS (SIMVASTATIN) 1 once daily  #90 Tablet x 4   Entered and Authorized by:   Gordy Savers  MD   Signed by:   Gordy Savers  MD on 06/11/2010   Method used:   Print then Give to Patient   RxID:   [id number]

## 2010-12-10 ENCOUNTER — Encounter: Payer: Self-pay | Admitting: Internal Medicine

## 2010-12-10 ENCOUNTER — Ambulatory Visit (INDEPENDENT_AMBULATORY_CARE_PROVIDER_SITE_OTHER): Payer: Self-pay | Admitting: Internal Medicine

## 2010-12-10 DIAGNOSIS — E785 Hyperlipidemia, unspecified: Secondary | ICD-10-CM

## 2010-12-10 DIAGNOSIS — J309 Allergic rhinitis, unspecified: Secondary | ICD-10-CM

## 2010-12-10 DIAGNOSIS — I10 Essential (primary) hypertension: Secondary | ICD-10-CM

## 2010-12-10 MED ORDER — SIMVASTATIN 40 MG PO TABS: 40.0000 mg | ORAL_TABLET | Freq: Every day | ORAL | Status: DC

## 2010-12-10 MED ORDER — FLUTICASONE PROPIONATE 50 MCG/ACT NA SUSP: 1.0000 | Freq: Every day | NASAL | Status: DC

## 2010-12-10 MED ORDER — VALSARTAN-HYDROCHLOROTHIAZIDE 160-25 MG PO TABS: 1.0000 | ORAL_TABLET | Freq: Every day | ORAL | Status: DC

## 2010-12-10 NOTE — Progress Notes (Signed)
  Subjective:    Patient ID: Cynthia Herrera, female    DOB: 04/19/1946, 65 y.o.   MRN: [id number]  HPI  65 year old patient who is seen today for follow-up of her hypertension.  This has the well-controlled on present therapy.  She has dyslipidemia and is on simvastatin 40 mg daily.  No concerns or complaints.  She has osteoarthritis and has had knee replacement surgery in the past.  This has been stable. She has been under my situational stress due to the poor health.  A sister with dementia at a nursing home.  He did also been working extended hours and has had very little personal time.  She has had some insomnia.  Also describes some significant nasal stuffiness.  It does interfere with sleep during the night.  She has a history of COPD and osteopenia.      Review of Systems  Constitutional: Negative.   HENT: Negative for hearing loss, congestion, sore throat, rhinorrhea, dental problem, sinus pressure and tinnitus.   Eyes: Negative for pain, discharge and visual disturbance.  Respiratory: Negative for cough and shortness of breath.   Cardiovascular: Negative for chest pain, palpitations and leg swelling.  Gastrointestinal: Negative for nausea, vomiting, abdominal pain, diarrhea, constipation, blood in stool and abdominal distention.  Genitourinary: Negative for dysuria, urgency, frequency, hematuria, flank pain, vaginal bleeding, vaginal discharge, difficulty urinating, vaginal pain and pelvic pain.  Musculoskeletal: Negative for joint swelling, arthralgias and gait problem.  Skin: Negative for rash.  Neurological: Negative for dizziness, syncope, speech difficulty, weakness, numbness and headaches.  Hematological: Negative for adenopathy.  Psychiatric/Behavioral: Negative for behavioral problems, dysphoric mood and agitation. The patient is not nervous/anxious.        Objective:   Physical Exam  Constitutional: She is oriented to person, place, and time. She appears  well-developed and well-nourished.       Overweight Pressure 130/80  HENT:  Head: Normocephalic.  Right Ear: External ear normal.  Left Ear: External ear normal.  Mouth/Throat: Oropharynx is clear and moist.  Eyes: Conjunctivae and EOM are normal. Pupils are equal, round, and reactive to light.  Neck: Normal range of motion. Neck supple. No thyromegaly present.  Cardiovascular: Normal rate, regular rhythm, normal heart sounds and intact distal pulses.   Pulmonary/Chest: Effort normal and breath sounds normal.  Abdominal: Soft. Bowel sounds are normal. She exhibits no mass. There is no tenderness.  Musculoskeletal: Normal range of motion.  Lymphadenopathy:    She has no cervical adenopathy.  Neurological: She is alert and oriented to person, place, and time.  Skin: Skin is warm and dry. No rash noted.  Psychiatric: She has a normal mood and affect. Her behavior is normal.          Assessment & Plan:  Hypertension stable Allergic rhinitis.  Will add fluticasone to her regimen Insomnia dyslipidemia

## 2010-12-10 NOTE — Patient Instructions (Signed)
Limit your sodium (Salt) intake    It is important that you exercise regularly, at least 20 minutes 3 to 4 times per week.  If you develop chest pain or shortness of breath seek  medical attention.  Take a calcium supplement, plus 800-1200 units of vitamin D   

## 2011-03-03 ENCOUNTER — Other Ambulatory Visit: Payer: Self-pay | Admitting: Gynecology

## 2011-03-12 NOTE — Op Note (Signed)
[place]  Patient:    Cynthia Herrera, Cynthia Herrera                    MRN: [id number] Proc. Date: 05/08/01 Adm. Date:  [id number] Attending:  Janalyn Rouse CC:         Gordy Savers, M.D.  Earley Abide ______, M.D.   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  OPERATION:  Left partial mastectomy and axillary lymph node dissection.  SURGEON:  Rose Phi. Maple Hudson, M.D.  ANESTHESIA:  General.  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  In addition, she had an obvious palpable lymph node which was also core biopsied and showed metastatic disease.  She is scheduled for partial mastectomy and node dissection.  DESCRIPTION OF PROCEDURE:  After suitable general anesthesia was induced, the patient was placed in the supine position and the left breast and axilla prepped and draped in the usual fashion.  Palpable masses at the 7 oclock position just away from the areola margin a curved incision was made and a wide dissection was carried out and the specimen oriented for the pathologist. At about the 7 oclock position from the primary specimen I could feel more tumor and I excised that so that we would have clean margins.  Hemostasis was obtained with a cautery.  A transverse axillary incision was then made and we dissected through the subcutaneous tissue and put a self-retaining retractor in.  We exposed the pectoralis major muscle and then dissected along it retracting it and exposing the pectoralis minor.  The clavicle pectoralis fascia over the subclavian vein was then identified and divided and we exposed the level 1 and level 2 and then swept out and dissected all  the tissue out with preservation and identification along the thoracic thoracodorsal nerves and intercostal brachialis divided.  Other vessels were clipped and divided.  Following removal of the specimen, we had good hemostasis.  We thoroughly irrigated with p.o. saline.  A 19 [place] Blake drain was inserted and brought out through a separate stab wound.  The subcutaneous tissue was closed with 3-0 Vicryl and the skin with staples.  We went back to the partial mastectomy specimen and put clips around the margins.  This was closed with subcuticular 4-0 Monocryl and Steri-Strips. Dressing was applied.  The patient was transferred to the recovery room in stable condition having tolerated the procedure well. DD:  05/08/01 TD:  05/08/01 Job: [id number]

## 2011-03-12 NOTE — Op Note (Signed)
[place]  Patient:    Cynthia Herrera, Cynthia Herrera Visit Number: [id number] MRN: [id number]          Service Type: DSU Location: [facility] Attending Physician:  Janalyn Rouse Proc. Date: 06/19/01 Adm. Date:  06/19/2001                             Operative Report  PREOPERATIVE DIAGNOSIS:  Carcinoma of the left breast.  POSTOPERATIVE DIAGNOSIS:  Carcinoma of the left breast.  OPERATION PERFORMED:  Port-A-Cath insertion.  SURGEON:  Rose Phi. Maple Hudson, M.D.  ANESTHESIA:  MAC  DESCRIPTION OF PROCEDURE:  The patient was placed on the operating table and the right upper chest and neck prepped and draped in the usual fashion.  Under local anesthesia, a right subclavian puncture was carried out without difficulty and the guide wire inserted and manipulated into its proper position which was confirmed by fluoroscopy.  Under local anesthesia a transverse incision was made on the anterior chest wall and a pocket developed for the insertion of the Port-A-Cath.  We then tunneled between the prepared pocket and the subclavian area and passed the pre-attached Bard catheter and then anchored the implantable port in the pocket with two 2-0 Prolene sutures.  The catheter was appropriately trimmed to the proper length and then the dilator and peel-away sheath passed over the wire and then we removed the wire and the dilator and passed the catheter through the peel-away sheath we then removed.  Proper positioning of the catheter tip without kinking was also confirmed by fluoroscopy.  The incisions were closed with 3-0 Vicryl and subcuticular 4-0 Monocryl and the system was accessed and then fully heparinized.  She is to get her chemotherapy later today.  Dressings were applied and the patient was transferred to the recovery room in satisfactory condition having tolerated the procedure well. Attending Physician:  Janalyn Rouse DD:  06/19/01 TD:   06/19/01 Job: [id number]

## 2011-03-12 NOTE — Assessment & Plan Note (Signed)
[facility] OFFICE NOTE   Cynthia Herrera, Cynthia Herrera                 MRN:          [id number]  DATE:05/17/2006                            DOB:          1946/07/09    A 65 year old female seen today for a comprehensive exam.  She has a history  of hypertensive, hyperlipidemia.  She is 5 years out from a left mastectomy.  She is followed closely by Dr. Donnie Coffin as well as Dr. Maple Hudson.  She has a  history of COPD, colonic polyps and a history of a herniated disk.  She has  had a cholecystectomy in 2004.   MEDICAL REGIMEN:  Includes Diovan and simvastatin.   REVIEW OF SYSTEMS EXAM:  Negative.  She has lost 9 pounds.  Doing quite well  status post left knee replacement surgery about 8 months ago.  Did have a  stress test in 2003 as well as a colonoscopy in 2003.   FAMILY HISTORY:  Strongly positive for diabetes.   EXAM:  Revealed a healthy appearing female in no acute distress.  Blood  pressure was 122/82.  FUNDI, EAR, NOSE AND THROAT:  Clear.  NECK:  No bruits.  CHEST:  Clear.  CARDIOVASCULAR:  Normal heart sounds, no murmurs.  Rest of the exam  unremarkable except for surgical scars.  Axilla clear.  ABDOMEN:  Soft and nontender.  No organomegaly, no bruits.  EXTREMITIES:  Revealed full peripheral pulses.  He had diminished vibratory  sensation involving the left lower leg.   IMPRESSION:  1.  Hypertension.  2.  Degenerative joint disease.  3.  Hypercholesterolemia.  4.  Remote breast cancer.   DISPOSITION:  1.  She will continue her oncologic and gynecologic follow up.  2.  Return here in 6 months for follow up, blood sugar monitored at that      time.                                   Gordy Savers, MD   PFK/MedQ  DD:  05/17/2006  DT:  05/17/2006  Job #:  [id number]

## 2011-03-12 NOTE — Op Note (Signed)
NAMEAUDREYANNA, BUTKIEWICZ             ACCOUNT NO.:  [id number]   MEDICAL RECORD NO.:  [id number]          PATIENT TYPE:  INP   LOCATION:  0003                         FACILITY:  [facility]   PHYSICIAN:  Vania Rea. Supple, M.D.  DATE OF BIRTH:  [birth year]   DATE OF PROCEDURE:  09/23/2005  DATE OF DISCHARGE:                                 OPERATIVE REPORT   PREOPERATIVE DIAGNOSIS:  End-stage left knee osteoarthrosis.   POSTOPERATIVE DIAGNOSIS:  End-stage left knee osteoarthrosis.   PROCEDURE:  Cemented left knee DePuy posterior stabilized Sigma implant with  a size 3 femur, size 4 tibia, a 10 mm thick rotating platform, posterior  stabilized tibial tray, and a 35 mm patellar button.   SURGEON:  Vania Rea. Supple, M.D.   Threasa HeadsFrench Ana A. Shuford, P.A.-C.   ANESTHESIA:  General endotracheal.   TOURNIQUET TIME:  One hour, 30 minutes.   BLOOD LOSS:  200 cc.   DRAINS:  Hemovac x1.   HISTORY:  Ms. Kaney is a 65 year old female who has had chronic left knee  pain, which has been an underlying arthrosis refractory to multiple attempts  at conservative management, including recent arthroscopy.  Plain radiographs  confirm significant joint space narrowing and tricompartmental disease.  Due  to her ongoing pain and functional limitation, she is brought to the  operating room for planned total knee arthroplasty, as described below.   I preoperatively counseled Ms. Vonita Moss on treatment options as well as  risks versus benefits thereof, possible surgical complications of bleeding,  infection, neurovascular injury, persistent of pain, loss of motion,  anesthetic complication, and possible need for additional surgery, including  potential need for revision, were reviewed.  The patient understands,  accepts, and agrees for planned procedure.   PROCEDURE IN DETAIL:  After undergoing routine preoperative evaluation, the  patient was placed on prophylactic antibiotics and had a femoral nerve  block  established in the holding area by the anesthesia department.  Transferred  to the operating room, placed supine on the operating room table, and  underwent smooth induction of general endotracheal anesthesia.  The Foley  catheter was placed.  She received preoperative antibiotics.  The tourniquet  was applied to the left thigh.  The left leg was sterilely prepped and  draped in the standard fashion.  The leg was exsanguinated with the  tourniquet inflated to 350 mmHg.  An anterior midline incision was then made  from four fingerbreadths above the patella to just medial to the tibial  tubercle to a length of approximately 20 cm.  Skin flaps were  circumferentially mobilized and tied back.  Medial parapatellar arthrotomy  was then performed.  The patella was everted.  Approximately half the fat  pad was excised.  The knee was then flexed up with remnants of the ACL  removed, as were the anterior portions of the menisci removed.  A drill was  then used to gain access to the femoral intramedullary canal.  An  intramedullary guide was then passed, and we made an 11 mm resection for the  distal femur and a 5 degree valgus slope.  The distal femur was then  measured, and a size 3 had the best fit.  A size 3 chamfer cutting block was  then appropriately placed, and the anterior, posterior, and chamfer cuts  were then made.  A trial implant was then placed, showing good fit.  Attention was then directed to the proximal tibia with the knee flexed up.  Using an extramedullary guide, taking care to protect the surrounding soft  tissues, we made an approximately 3 degree posterior slope cut across the  proximal tibia, ultimately resecting approximately 14 mm of bone, measuring  from the lateral side.  This ultimately gave [place] excellent soft tissue  balance in both flexion and extension.  The stabilizing keel and broach were  then drilled into the proximal tibia.  Attention then returned to the  distal  femur, where the box cutting guide was then placed, and the oscillating saw  was then used to make a box cut.  The final size 3 trial with the box in  place was then fitted over the distal femur, and we made a trial reduction.  Again, it showed good soft tissue balance and excellent stability with 10 mm  implant.  Attention was then turned to the patella, and the oscillating saw  was then used to resect approximately 8.5 mm of bone across the patella, and  then stabilizing drill holes were then made.  All implants were then  removed.  Pulsatile lavage was then used to meticulously irrigate the joint.  A curved osteotome was used to remove osteophytes from the posterior aspect  of the femoral condyles, and the posterior compartments were meticulously  debrided by meniscal and bony debris.  The knee was then flexed up.  The  proximal tibia was then meticulously cleaned and dried, and the cement was  then mixed to the appropriate consistency.  The limbs were cemented into  position, beginning with the tibia, then the femur, and then the patella.  Meticulous removal of all extra cement was completed.  The implant was  solidly fixed, and a final reduction was then performed with a 10 mm trial,  showing excellent stability.  The final 10 mm rotating platform, posterior  stabilized implant was then opened and placed into position.  The knee was  terminally reduced.  Full extension.  Good soft tissue balance.  A Hemovac  drain was then brought out laterally.  The tourniquet was let down.  Hemostasis was obtained.  The paramedian incision was closed with a series  of interrupted figure-of-eight #1 Vicryl sutures.  Vicryl 2-0 were used for  the deep and superficial subcu, and intracuticular 3-0 Monocryl for the  skin, followed by Steri-Strips.  A bulky dry dressing was applied.  The  patient was then extubated and taken to the recovery room in stable  condition.      Vania Rea. Supple,  M.D.  Electronically Signed     KMS/MEDQ  D:  09/23/2005  T:  09/23/2005  Job:  [id number]

## 2011-03-12 NOTE — Op Note (Signed)
[facility]  Patient:    Cynthia Herrera, Cynthia Herrera Visit Number: [id number] MRN: [id number]          Service Type: DSU Location: [facility] Attending Physician:  Janalyn Rouse Dictated by:   Teena Irani. Odis Luster, M.D. Proc. Date: 01/17/02 Admit Date:  11/02/2001 Discharge Date: 11/02/2001                             Operative Report  PREOPERATIVE DIAGNOSIS:  Intravenous chemotherapy injection injury, right arm antecubital fossa with fat necrosis.  POSTOPERATIVE DIAGNOSIS: 1. Intravenous chemotherapy injection injury, right arm antecubital fossa with    fat necrosis. 2. Entrapment of an antecubital nerve. 3. A complicated open wound of the right arm 4.0 cm.  OPERATION PERFORMED: 1. Exploration of antecubital fossa and excision of extensive area of fat    necrosis greater than 2.0 cm. 2. Neurolysis antecubital nerve in right forearm. 3. Complex wound closure 4.00 cm.  SURGEON:  Teena Irani. Odis Luster, M.D.  ANESTHESIA:  General.  ESTIMATED BLOOD LOSS:  Minimal.  TOURNIQUET TIME:  19 minutes at 250 mHg and there was one rubber band drain left in the wound.  INDICATIONS FOR PROCEDURE:  This 65 year old woman has breast cancer and had a chemotherapy infiltration of the antecubital fossa.  She suffered fairly significant inflammatory reaction with some areas of necrosis of deep subcutaneous tissues.  The skin survived.  She presented at this time for excision of this area and exploration of the wound for a fairly significant amount of pain that she was having in the area.  The nature of the procedure and the risks were discussed with her in detail and she understood there may be persistence of pain and possibility of wound healing problems, bleeding, infection and anesthesia related complications and wished to proceed.  In addition she wanted to coordinate this for her Port-A-Cath removal by Dr. Francina Ames.  DESCRIPTION OF PROCEDURE:  The patient was  taken to the operating room and placed supine.  After successful induction of general anesthesia, the right arm was prepped with Betadine and she was draped with sterile drapes.  The planned incision was then marked as a Z-type of incision approaching the antecubital fossa in an oblique direction and then extending transversely across the antecubital fossa.  The Esmarch was used to exsanguinate the arm and the tourniquet inflated to 250 mHg.  The incision was made and the dissection carried down to the subcutaneous tissues.  Meticulous hemostasis with the needle tip electrode cautery.  Loupe magnification was used for the exploration.  This was a fairly extensive area of fat necrosis which was identified and gently dissected free from surrounding tissues.  There was an antecubital nerve that was identified and a neurolysis was performed of the nerve lifting this area of fat necrosis directly off of the antecubital nerve which was left unharmed.  At the conclusion of the excision which was multifocal areas and a very fairly large central area there was meticulous hemostasis with the electrocautery.  The wound was irrigated thoroughly with saline and with steady pressure maintained on the wound, the tourniquet was deflated.  Again, a careful check was made for hemostasis which was noted to be excellent.  A rubber band drain was placed in the depth of the wound and brought out through the central aspect.  A layered closure of this wound was performed using 3-0 Vicryl interrupted inverted deep sutures after first feeling  digitally manually to make sure there were no other firm areas of fat necrosis present.  3-0 Vicryl suture interrupted inverted deep dermal sutures for the remainder of the closure and a dry sterile dressing was placed. Kerlix was then placed and an Ace wrap from the hand back to the distal upper arm.  The patient tolerated the procedure well.  Dr. Francina Ames then entered the  room for removal of the Port-A-Cath.  DISPOSITION:  She is discharged on diet as tolerated.  Keep the dressing in place and dry.  Vicodin total of 15 given 1 to 2 p.o. q.4-6h. p.r.n. for pain. Keep the right arm elevated.  Will recheck her in the office in two days. Dictated by:   Teena Irani. Odis Luster, M.D. Attending Physician:  Janalyn Rouse DD:  01/17/02 TD:  01/17/02 Job: [id number]

## 2011-03-12 NOTE — Op Note (Signed)
NAME:  Cynthia Herrera, Cynthia Herrera                       ACCOUNT NO.:  [id number]   MEDICAL RECORD NO.:  [id number]                   PATIENT TYPE:  OIB   LOCATION:  2550                                 FACILITY:  [facility]   PHYSICIAN:  Lorre Munroe., M.D.            DATE OF BIRTH:  [birth year]   DATE OF PROCEDURE:  12/26/2002  DATE OF DISCHARGE:                                 OPERATIVE REPORT   PREOPERATIVE DIAGNOSIS:  Symptomatic gallstones.   POSTOPERATIVE DIAGNOSIS:  Symptomatic gallstones.   OPERATION PERFORMED:  Laparoscopic cholecystectomy.   SURGEON:  Lebron Conners, M.D.   ASSISTANT:  Gita Kudo, M.D.   ANESTHESIA:  General.   DESCRIPTION OF PROCEDURE:  After the patient was monitored and anesthetized  and had routine preparation and draping of the abdomen, I injected local  anesthetic just below the umbilicus, made a short transverse incision where  there had been a previous laparoscopic incision, dissecting down through the  scar tissue and fat until I clearly identified the midline fascia and I  opened it longitudinally in the midline and bluntly entered the peritoneal  cavity.  I dissected around to be sure there were no adhesions present and  then encircled the incision with a 0 Vicryl suture and secured a Hasson  cannula.  After inflating the abdomen with CO2, I inspected the contents and  saw no abnormalities except for a distended gallbladder obviously containing  stones and with moderate inflammation evident.  There were considerable  adhesions of omentum to the undersurface of the gallbladder on the liver.  I  then anesthetized three additional port sites and put in three more ports  under direct vision then retracted the fundus of the gallbladder toward the  right shoulder and took down the adhesions.  I placed the patient head up  and foot down and tilted to the left and then after exposing the  infundibulum retracted that laterally to the right.  We then  dissected the  hepatoduodenal ligament until I clearly identified the cystic duct emerging  from the infundibulum of the gallbladder.  I clipped it with four clips  after dissecting it out and divided between the two which were closest to  the gallbladder.  I then dissected to find the cystic artery.  I found that  the hepatic artery made a loop up into the hepatoduodenal ligament and I  dissected along that until I identified the cystic artery emerging from it  and going to the gallbladder.  I clipped it with four clips and cut between  the two closest to the gallbladder.  I then used the cautery to dissect the  gallbladder from the liver and got hemostasis in the gallbladder fossa using  the cautery.  After detaching the gallbladder from the liver, I placed it in  a plastic pouch and removed it through the umbilical incision.  It was  necessary to enlarge the incision slightly and  to open the gallbladder and  remove some of the large gallstones before I could get it out.  After  removing the gallbladder, I tied the pursestring suture and irrigated out  the incision.  I copiously irrigated the right upper quadrant and was  satisfied that hemostasis was good and that all my clips were  secure.  I then removed all the irrigant and removed the lateral ports under  direct vision and allowed the CO2 to escape and removed the epigastric  ports.  I closed all skin incisions with intracuticular 4-0 Vicryl and Steri-  Strips.  The patient tolerated the procedure well.                                                Lorre Munroe., M.D.    Jodi Marble  D:  12/26/2002  T:  12/26/2002  Job:  [id number]   cc:   Gordy Savers, M.D. [facility]

## 2011-03-12 NOTE — Discharge Summary (Signed)
NAMECORIN, FORMISANO             ACCOUNT NO.:  [id number]   MEDICAL RECORD NO.:  [id number]          PATIENT TYPE:  INP   LOCATION:  1504                         FACILITY:  [facility]   PHYSICIAN:  Vania Rea. Supple, M.D.  DATE OF BIRTH:  Apr 28, 1946   DATE OF ADMISSION:  09/23/2005  DATE OF DISCHARGE:  09/26/2005                                 DISCHARGE SUMMARY   ADMISSION DIAGNOSES:  1.  End-stage osteoarthrosis of left knee.  2.  History of breast cancer.  3.  Hypertension.  4.  Hypercholesterolemia.   DISCHARGE DIAGNOSES:  1.  End-stage osteoarthrosis of left knee.  2.  History of breast cancer.  3.  Hypertension.  4.  Hypercholesterolemia.  5.  Status post left total knee arthroplasty.   OPERATION:  Left total knee arthroplasty.   SURGEON:  Vania Rea. Supple, M.D.   Threasa HeadsFrench Ana A. Shuford, P.A.-C.   ANESTHESIA:  General.   BRIEF HISTORY:  Ms. Nessel is a 65 year old female who has had chronic  left knee pain with underlying arthrosis refractory to conservative  management.  She also underwent recent arthroscopy with little improvement.  X-rays demonstrated joint space narrowing and peripheral osteophytes.  At  this time, she has had significant pain.  It has been refractory to  conservative measures.  Total knee arthroplasty was indicated.  This has  been discussed at length, and she wishes to proceed.   HOSPITAL COURSE:  Patient was admitted and underwent the above-named  procedure and tolerated this well.  Appropriate IV antibiotics and  analgesics were utilized.  Postoperatively, she is placed on DVT and PE  prophylaxis with Coumadin and allow weightbearing as tolerated.  Total knee  protocol.  She was able to be weaned off IV analgesics and did extremely  with physical therapy.  She remained afebrile.  Other than transient  elevated temps, the incision was clean and dry without signs of cellulitis.  By September 26, 2005, she had met all of her discharge goals  in physical and  occupational therapy.  She was hemodynamically stable.  This is felt to be  related mostly to atelectasis, and pulmonary toilet was encouraged; however,  she had met all discharge goals and was stable for discharge home to follow  up on an outpatient basis.   LABORATORY DATA:  Admission labs:  Hemoglobin 13.2 postoperatively, 10.4,  9.1, and 9.4.  Pro time is followed by pharmacy on Coumadin.  She had mild  hyponatremia, noted to be 134 and 131 but stable.  Urinalysis within normal  limits.  Blood type shows 0+.   EKG shows nonspecific ST/T wave changes.   X-rays show degenerative changes of left knee.   Chest x-ray not found at the time of dictation.   CONDITION ON DISCHARGE:  Stable and improved.   DISCHARGE MEDICATIONS:  Patient will be discharged to home.  She is on the  following prescriptions:  Coumadin, Robaxin, and Percocet.  She did not  tolerate Percocet.  Of note, called our office the following day for a new  prescription.  She was on OT/PT home health with [place].  Resume other home  meds on diet.  Daily dressing changes.  May shower at this time.  Follow up  in two weeks.  Call for time.      Tracy A. Shuford, P.A.-C.      Vania Rea. Supple, M.D.  Electronically Signed    TAS/MEDQ  D:  11/01/2005  T:  11/02/2005  Job:  [id number]
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
## 2011-03-12 NOTE — H&P (Signed)
   NAME:  Cynthia Herrera, Cynthia Herrera                       ACCOUNT NO.:  [id number]   MEDICAL RECORD NO.:  [id number]                   PATIENT TYPE:  INP   LOCATION:  3738                                 FACILITY:  [facility]   PHYSICIAN:  Gordy Savers, M.D. [facility]      DATE OF BIRTH:  1946/08/14   DATE OF ADMISSION:  06/17/2002  DATE OF DISCHARGE:                                HISTORY & PHYSICAL   REASON FOR ADMISSION:  Epigastric pain.   HISTORY OF PRESENT ILLNESS:  The patient is a 65 year old woman with a one-  day burning pain at the epigastric area, not necessarily in the context of  eating.  She had one episode of associated nausea and vomiting.   PAST MEDICAL HISTORY:  No heart disease.  She does have a history of  hypertension and dyslipidemia.   MEDICATIONS:  Diovan/HCT and Lipitor, at uncertain doses.   SOCIAL HISTORY:  The patient neither drinks nor smokes.  She is married.  She works as an Production designer, theatre/television/film and her husband is with her today.   FAMILY HISTORY:  Negative for heart disease at a young age.   REVIEW OF SYSTEMS:  Denies the following:  Chest pain, fever, chills, cough,  shortness of breath, skin rash, hematuria, rectal bleeding, dysuria,  constipation, and diarrhea.   PHYSICAL EXAMINATION:  VITAL SIGNS:  Blood pressure 144/74, heart rate 86,  respiratory rate 22.  The patient is afebrile.  GENERAL:  Obese, no distress.  SKIN:  Not diaphoretic.  HEENT:  Head is atraumatic, sclerae non-icteric.  Pharynx clear.  NECK:  Supple.  CHEST:  Clear to auscultation.  CARDIOVASCULAR:  No JVD, no edema.  Regular rate and rhythm.  No murmur.  Pedal pulses are intact.  ABDOMEN:  Soft, obese, nontender, no hepatosplenomegaly, no mass.  The  obesity limiting examination.  BREASTS/GYNECOLOGIC/RECTAL:  Examinations not done at this time due to the  patient's condition.  EXTREMITIES:  No obvious deformities.  NEUROLOGIC:  Alert, well oriented.  Cranial nerves appeared to be  intact and  the patient moves all fours and sensation is intact to touch in the feet.   LABORATORY DATA:  Electrocardiogram shows inverted T waves anteriorly.   WBC 14,400.  Sodium 133, potassium 3.0, glucose 155.   IMPRESSION:  1. Epigastric pain of uncertain etiology.  2. Hypokalemia.  3. Hyperglycemia.    PLAN:  1. Telemetry.  2. Check CPK's.  3. Proton pump inhibitor therapy.  4. Symptomatic therapy.  5. Replete potassium and recheck.     Sean A. Everardo All, M.D. [facility]                 Gordy Savers, M.D. [facility]    SAE/MEDQ  D:  06/17/2002  T:  06/19/2002  Job:  [id number]

## 2011-03-12 NOTE — Op Note (Signed)
[place]. [facility]  Patient:    Cynthia Herrera, Cynthia Herrera Visit Number: [id number] MRN: [id number]          Service Type: DSU Location: [facility] Attending Physician:  Janalyn Rouse Dictated by:   Rose Phi. Maple Hudson, M.D. Proc. Date: 11/02/01 Admit Date:  11/02/2001                             Operative Report  PREOPERATIVE DIAGNOSIS:  Persistent positive intramammary lymph node, upper outer quadrant left side.  POSTOPERATIVE DIAGNOSIS:  Persistent positive intramammary lymph node, upper outer quadrant left side.  OPERATION PERFORMED:  Excision of persistent positive intramammary lymph node, upper outer quadrant left side with needle localization and specimen mammography.  SURGEON:  Rose Phi. Maple Hudson, M.D.  ANESTHESIA:  MAC.  INDICATIONS FOR PROCEDURE:  This patient had had a previous lumpectomy and axillary node dissection but had a persistent intramammary node which had been previously biopsied and shown to contain metastatic disease.  This is for excision.  Prior to coming to the operating room she had a localization wire placed.  DESCRIPTION OF PROCEDURE:  She was placed on the operating table and the left breast prepped and draped in the usual fashion.  A curvilinear incision was then outlined centered on the area where the node was and then the area infiltrated with 1% Xylocaine with Adrenalin.  Incision was made and the wire exposed into the incision and then the area excised.  Specimen mammography confirmed the removal of the nodule.  Hemostasis obtained with the cautery. Subcuticular closure of 4-0 Monocryl and Steri-Strips carried out.  Dressing applied.  Patient transferred to the recovery room in satisfactory condition having tolerated the procedure well. Dictated by:   Rose Phi. Maple Hudson, M.D. Attending Physician:  Janalyn Rouse DD:  11/02/01 TD:  11/02/01 Job: [id number]

## 2011-03-12 NOTE — Op Note (Signed)
[place]. [facility]  Patient:    IZADORA, ROEHR Visit Number: [id number] MRN: [id number]          Service Type: DSU Location: [facility] Attending Physician:  Janalyn Rouse Dictated by:   Rose Phi. Maple Hudson, M.D. Proc. Date: 01/17/02 Admit Date:  11/02/2001 Discharge Date: 11/02/2001                             Operative Report  PREOPERATIVE DIAGNOSIS:  Carcinoma of the left breast.  POSTOPERATIVE DIAGNOSIS:  Carcinoma of the left breast.  OPERATION PERFORMED:  Removal of Port-A-Cath.  SURGEON:  Rose Phi. Maple Hudson, M.D.  ANESTHESIA:  General.  DESCRIPTION OF PROCEDURE:  After Dr. Mallie Mussel had completed excising some areas of fat necrosis and scarred veins in her right arm, we then prepped and draped the right upper chest.  The short transverse incision over the Port-A-Cath was made and it was extracted after grasping the catheter and pulling it out and dividing the two sutures anchoring it in place.  There was no bleeding.  Incision closed with a 5-0 nylon.  Dressing applied.  The patient was transferred to the recovery room in satisfactory condition having tolerated the procedure well. Dictated by:   Rose Phi. Maple Hudson, M.D. Attending Physician:  Janalyn Rouse DD:  01/17/02 TD:  01/17/02 Job: [id number]

## 2011-03-18 ENCOUNTER — Ambulatory Visit (AMBULATORY_SURGERY_CENTER): Payer: Medicare Other | Admitting: *Deleted

## 2011-03-18 VITALS — Ht 66.0 in | Wt 243.0 lb

## 2011-03-18 DIAGNOSIS — Z860100 Personal history of colon polyps, unspecified: Secondary | ICD-10-CM

## 2011-03-18 MED ORDER — PEG-KCL-NACL-NASULF-NA ASC-C 100 G PO SOLR: ORAL | Status: DC

## 2011-03-30 ENCOUNTER — Encounter (INDEPENDENT_AMBULATORY_CARE_PROVIDER_SITE_OTHER): Payer: Self-pay | Admitting: General Surgery

## 2011-04-01 ENCOUNTER — Ambulatory Visit (AMBULATORY_SURGERY_CENTER): Payer: Medicare Other | Admitting: Internal Medicine

## 2011-04-01 ENCOUNTER — Encounter: Payer: Self-pay | Admitting: Internal Medicine

## 2011-04-01 VITALS — BP 164/96 | HR 75 | Temp 98.3°F | Resp 22 | Ht 66.0 in | Wt 245.0 lb

## 2011-04-01 DIAGNOSIS — Z86010 Personal history of colonic polyps: Secondary | ICD-10-CM

## 2011-04-01 DIAGNOSIS — D126 Benign neoplasm of colon, unspecified: Secondary | ICD-10-CM

## 2011-04-01 DIAGNOSIS — Z1211 Encounter for screening for malignant neoplasm of colon: Secondary | ICD-10-CM

## 2011-04-01 DIAGNOSIS — K5730 Diverticulosis of large intestine without perforation or abscess without bleeding: Secondary | ICD-10-CM

## 2011-04-01 MED ORDER — SODIUM CHLORIDE 0.9 % IV SOLN: 500.0000 mL | INTRAVENOUS | Status: DC

## 2011-04-01 NOTE — Patient Instructions (Signed)
Please review discharge instructions Please review information about diverticulosis, polyps

## 2011-04-02 ENCOUNTER — Telehealth: Payer: Self-pay | Admitting: *Deleted

## 2011-04-02 NOTE — Telephone Encounter (Signed)

## 2011-04-06 NOTE — Progress Notes (Signed)
Quick Note:  Diminutive adenoma, diverticulosis and lipoma Prior adenomas  Repeat colonoscopy 5 years ______

## 2011-05-03 ENCOUNTER — Ambulatory Visit (INDEPENDENT_AMBULATORY_CARE_PROVIDER_SITE_OTHER): Payer: Medicare Other | Admitting: Internal Medicine

## 2011-05-03 ENCOUNTER — Encounter: Payer: Self-pay | Admitting: Internal Medicine

## 2011-05-03 DIAGNOSIS — M542 Cervicalgia: Secondary | ICD-10-CM

## 2011-05-03 DIAGNOSIS — IMO0002 Reserved for concepts with insufficient information to code with codable children: Secondary | ICD-10-CM

## 2011-05-03 DIAGNOSIS — M25511 Pain in right shoulder: Secondary | ICD-10-CM

## 2011-05-03 DIAGNOSIS — E785 Hyperlipidemia, unspecified: Secondary | ICD-10-CM

## 2011-05-03 DIAGNOSIS — M25519 Pain in unspecified shoulder: Secondary | ICD-10-CM

## 2011-05-03 MED ORDER — TRAMADOL HCL 50 MG PO TABS: 50.0000 mg | ORAL_TABLET | Freq: Four times a day (QID) | ORAL | Status: AC | PRN

## 2011-05-03 MED ORDER — METHYLPREDNISOLONE ACETATE 80 MG/ML IJ SUSP
80.0000 mg | Freq: Once | INTRAMUSCULAR | Status: AC
  Administered 2011-05-03: 80 mg via INTRAMUSCULAR

## 2011-05-03 NOTE — Progress Notes (Signed)
  Subjective:    Patient ID: Cynthia Herrera, female    DOB: 1946/09/24, 65 y.o.   MRN: [id number]  HPI  65 year old patient who is seen today with a two-week history of right neck and right shoulder discomfort. She does have a history of lumbar disc disease with radiculopathy in the past that was treated conservatively. She has treated hypertension and dyslipidemia which have been stable. He describes rare episodes of right hand tingling. Shoulder pain is not aggravated by movement of the arm or shoulder no history of trauma    Review of Systems  Constitutional: Negative.   HENT: Negative for hearing loss, congestion, sore throat, rhinorrhea, dental problem, sinus pressure and tinnitus.   Eyes: Negative for pain, discharge and visual disturbance.  Respiratory: Negative for cough and shortness of breath.   Cardiovascular: Negative for chest pain, palpitations and leg swelling.  Gastrointestinal: Negative for nausea, vomiting, abdominal pain, diarrhea, constipation, blood in stool and abdominal distention.  Genitourinary: Negative for dysuria, urgency, frequency, hematuria, flank pain, vaginal bleeding, vaginal discharge, difficulty urinating, vaginal pain and pelvic pain.  Musculoskeletal: Positive for back pain. Negative for joint swelling, arthralgias and gait problem.  Skin: Negative for rash.  Neurological: Negative for dizziness, syncope, speech difficulty, weakness, numbness and headaches.  Hematological: Negative for adenopathy.  Psychiatric/Behavioral: Negative for behavioral problems, dysphoric mood and agitation. The patient is not nervous/anxious.        Objective:   Physical Exam  Constitutional: She is oriented to person, place, and time. She appears well-developed and well-nourished.  HENT:  Head: Normocephalic.  Right Ear: External ear normal.  Left Ear: External ear normal.  Mouth/Throat: Oropharynx is clear and moist.  Eyes: Conjunctivae and EOM are normal. Pupils  are equal, round, and reactive to light.  Neck: Normal range of motion. Neck supple. No thyromegaly present.  Cardiovascular: Normal rate, regular rhythm, normal heart sounds and intact distal pulses.   Pulmonary/Chest: Effort normal and breath sounds normal.  Abdominal: Soft. Bowel sounds are normal. She exhibits no mass. There is no tenderness.  Musculoskeletal: Normal range of motion.       Full range of motion of the right shoulder. Range of motion of the neck appear to be intact range of motion of the head neck did tend to aggravate the right neck and upper back discomfort. Reflexes were generally suppressed but symmetrical Strength normal  Lymphadenopathy:    She has no cervical adenopathy.  Neurological: She is alert and oriented to person, place, and time.  Skin: Skin is warm and dry. No rash noted.  Psychiatric: She has a normal mood and affect. Her behavior is normal.          Assessment & Plan:   Neck pain. 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 We'll treat with Depo-Medrol and continue ibuprofen. She was given a prescription for tramadol for pain. We'll continue with rest and warm compresses. She'll call if unimproved Hypertension stable

## 2011-05-03 NOTE — Patient Instructions (Addendum)
Most patients with neck pain will improve with time over the next two to 6 weeks.  Keep active but avoid any activities that cause pain.  Apply moist heat to the low back area several times daily. Call or return to clinic prn if these symptoms worsen or fail to improve as anticipated.

## 2011-05-04 ENCOUNTER — Telehealth: Payer: Self-pay | Admitting: *Deleted

## 2011-05-04 MED ORDER — PROMETHAZINE HCL 25 MG PO TABS: 25.0000 mg | ORAL_TABLET | Freq: Four times a day (QID) | ORAL | Status: AC | PRN

## 2011-05-04 NOTE — Telephone Encounter (Signed)
Pt has been having vomiting and sweating since steroid injection, and would like suggestin on what to do?

## 2011-05-04 NOTE — Telephone Encounter (Signed)
Phenergan 25 mg #20 one by mouth every 6 hours as needed for nausea

## 2011-05-04 NOTE — Telephone Encounter (Signed)
Attempt to call - ans mach - LMTCB if questions - [facility]

## 2011-05-05 ENCOUNTER — Telehealth: Payer: Self-pay

## 2011-05-05 DIAGNOSIS — M5412 Radiculopathy, cervical region: Secondary | ICD-10-CM

## 2011-05-05 MED ORDER — HYDROCODONE-ACETAMINOPHEN 5-500 MG PO TABS: 1.0000 | ORAL_TABLET | Freq: Four times a day (QID) | ORAL | Status: DC | PRN

## 2011-05-05 NOTE — Telephone Encounter (Signed)
Schedule cervical MRI; please call in a prescription for generic Vicodin 5/500 #50 refill 2;  directions take one or 2 every 6 hours as needed for pain

## 2011-05-05 NOTE — Telephone Encounter (Signed)
Pt left message stating that she does not need the phernergan rx'ed yesterday because she is not nauseous or vomiting, but her arm is "killing" her. She says that the tramadol is not working and wants to know if she should come back in to see Dr. Kirtland Bouchard or if there is something else she can do. Please Advise

## 2011-05-05 NOTE — Telephone Encounter (Signed)
Attempt to call - ans mach - LMTCB if questions - vicodin [facility] and ordered mri - terri will call once scheduled. KIK

## 2011-05-12 ENCOUNTER — Ambulatory Visit
Admission: RE | Admit: 2011-05-12 | Discharge: 2011-05-12 | Disposition: A | Discharge status: Home / Self Care | Payer: Medicare Other | Source: Ambulatory Visit | Attending: Internal Medicine | Admitting: Internal Medicine

## 2011-05-12 DIAGNOSIS — M5412 Radiculopathy, cervical region: Secondary | ICD-10-CM

## 2011-05-13 NOTE — Progress Notes (Signed)
Quick Note:  Spoke with pt - feeling better. Informed of xray results and dr. Vernon Prey instruction. She will call if sx get worse . ______

## 2011-05-27 ENCOUNTER — Telehealth: Payer: Self-pay | Admitting: *Deleted

## 2011-05-27 MED ORDER — HYDROCODONE-ACETAMINOPHEN 5-500 MG PO TABS: 1.0000 | ORAL_TABLET | Freq: Four times a day (QID) | ORAL | Status: DC | PRN

## 2011-05-27 NOTE — Telephone Encounter (Signed)
Notified pt. 

## 2011-05-27 NOTE — Telephone Encounter (Signed)
Generic Vicodin 5- 500  #15 one or 2 every 6 hours when necessary pain

## 2011-05-27 NOTE — Telephone Encounter (Signed)
Pt states she had developed some severe low back pain, and cannot drive to get into the office.  Would like RX called to [facility])

## 2011-06-11 ENCOUNTER — Other Ambulatory Visit (INDEPENDENT_AMBULATORY_CARE_PROVIDER_SITE_OTHER): Payer: Medicare Other

## 2011-06-11 DIAGNOSIS — E785 Hyperlipidemia, unspecified: Secondary | ICD-10-CM

## 2011-06-11 DIAGNOSIS — Z0000 Encounter for general adult medical examination without abnormal findings: Secondary | ICD-10-CM

## 2011-06-11 DIAGNOSIS — I10 Essential (primary) hypertension: Secondary | ICD-10-CM

## 2011-06-11 LAB — CBC WITH DIFFERENTIAL/PLATELET
Basophils Absolute: 0 10*3/uL (ref 0.0–0.1)
Eosinophils Relative: 2.3 % (ref 0.0–5.0)
Hemoglobin: 13 g/dL (ref 12.0–15.0)
Lymphocytes Relative: 20.4 % (ref 12.0–46.0)
Monocytes Relative: 7.7 % (ref 3.0–12.0)
Neutro Abs: 6.3 10*3/uL (ref 1.4–7.7)
Platelets: 327 10*3/uL (ref 150.0–400.0)
RDW: 13.8 % (ref 11.5–14.6)
WBC: 9.1 10*3/uL (ref 4.5–10.5)

## 2011-06-11 LAB — HEPATIC FUNCTION PANEL
ALT: 19 U/L (ref 0–35)
AST: 18 U/L (ref 0–37)
Bilirubin, Direct: 0.1 mg/dL (ref 0.0–0.3)
Total Bilirubin: 0.8 mg/dL (ref 0.3–1.2)
Total Protein: 7.2 g/dL (ref 6.0–8.3)

## 2011-06-11 LAB — LIPID PANEL
Cholesterol: 158 mg/dL (ref 0–200)
HDL: 61.7 mg/dL (ref >39.00)
LDL Cholesterol: 76 mg/dL (ref 0–99)
VLDL: 20.8 mg/dL (ref 0.0–40.0)

## 2011-06-11 LAB — BASIC METABOLIC PANEL
Calcium: 9.3 mg/dL (ref 8.4–10.5)
GFR: 97.02 mL/min (ref >60.00)
Glucose, Bld: 107 mg/dL — ABNORMAL HIGH (ref 70–99)
Potassium: 4 mEq/L (ref 3.5–5.1)
Sodium: 142 mEq/L (ref 135–145)

## 2011-06-11 LAB — POCT URINALYSIS DIPSTICK
Bilirubin, UA: NEGATIVE
Ketones, UA: NEGATIVE
Protein, UA: NEGATIVE
Spec Grav, UA: 1.025
pH, UA: 5

## 2011-06-18 ENCOUNTER — Ambulatory Visit (INDEPENDENT_AMBULATORY_CARE_PROVIDER_SITE_OTHER): Payer: Medicare Other | Admitting: Internal Medicine

## 2011-06-18 ENCOUNTER — Encounter: Payer: Self-pay | Admitting: Internal Medicine

## 2011-06-18 DIAGNOSIS — Z0000 Encounter for general adult medical examination without abnormal findings: Secondary | ICD-10-CM

## 2011-06-18 DIAGNOSIS — Z86010 Personal history of colonic polyps: Secondary | ICD-10-CM

## 2011-06-18 DIAGNOSIS — E785 Hyperlipidemia, unspecified: Secondary | ICD-10-CM

## 2011-06-18 DIAGNOSIS — J449 Chronic obstructive pulmonary disease, unspecified: Secondary | ICD-10-CM

## 2011-06-18 DIAGNOSIS — Z23 Encounter for immunization: Secondary | ICD-10-CM

## 2011-06-18 DIAGNOSIS — Z136 Encounter for screening for cardiovascular disorders: Secondary | ICD-10-CM

## 2011-06-18 DIAGNOSIS — I10 Essential (primary) hypertension: Secondary | ICD-10-CM

## 2011-06-18 MED ORDER — SIMVASTATIN 40 MG PO TABS: 40.0000 mg | ORAL_TABLET | Freq: Every day | ORAL | Status: DC

## 2011-06-18 MED ORDER — HYDROCODONE-ACETAMINOPHEN 5-500 MG PO TABS: 1.0000 | ORAL_TABLET | Freq: Four times a day (QID) | ORAL | Status: AC | PRN

## 2011-06-18 MED ORDER — VALSARTAN-HYDROCHLOROTHIAZIDE 160-25 MG PO TABS: 1.0000 | ORAL_TABLET | Freq: Every day | ORAL | Status: DC

## 2011-06-18 MED ORDER — FLUTICASONE PROPIONATE 50 MCG/ACT NA SUSP: 1.0000 | Freq: Every day | NASAL | Status: DC

## 2011-06-18 NOTE — Progress Notes (Signed)
Subjective:    Patient ID: Cynthia Herrera, female    DOB: [birth year], 65 y.o.   MRN: [id number]  HPI  65 year old patient who is seen today for a preventive health examination. She has a history of hypertension and dyslipidemia which has been stable. She is followed by Dr. Nicholas Lose and healing and does have a history of left breast cancer treated 10 years ago. She has a history also of colonic polyps and had a recent followup colonoscopy. She receives annual mammograms and gynecologic evaluations. She has a history of mild COPD and osteopenia. She no longer smokes. She did have a stress test performed approximately 3 years ago. No new concerns or complaints today  Past Medical History  Diagnosis Date  . Personal history of colonic polyps     adenomas 03 and 08  . Hyperlipidemia   . Hypertension   . HX: breast cancer   . Osteopenia   . Vitamin D deficiency   . Allergy     seasonal  . Arthritis   . Breast cancer     left: surgery,chemo,radiation  . COPD (chronic obstructive pulmonary disease)    Past Surgical History  Procedure Date  . Mastectomy July 2002    left  . Cholecystectomy 2004  . Colonoscopy 2003, September 2008, April 01, 2011    adenomas 03 and 08, polyp 12, diverticulosis  . Joint replacement     totally replacement surgery  . Tubal ligation   . Breast lumpectomy 2002  . Port-a-cath removal   . Total knee arthroplasty 2006    reports that she quit smoking about 9 years ago. She has never used smokeless tobacco. She reports that she does not drink alcohol or use illicit drugs. family history includes Cancer in her mother; Dementia in her sister; Diabetes in her brother, father, mother, and sister; Heart disease in her brother and father; and Liver cancer in her mother. Allergies  Allergen Reactions  . Codeine Phosphate    Family History:  Reviewed history from 05/29/2009 and no changes required.  Family History Diabetes 1st degree relative  Fam hx MI  Family  History Other cancer-Father died  at 49 of an MI; mother deceased from pancreatic cancer. Two brothers; one deceased. One status post CABG; one sister.- DM and dementia; History DVT. Both parents. Both brothers and one sister with diabetes;  Social History:  Reviewed history from 05/24/2008 and no changes required.  Former Smoker  Married  presently unemployed   1. Risk factors, based on past  M,S,F history-  cardiovascular risk factors include hypertension and dyslipidemia. She also has a family history of coronary artery disease  2.  Physical activities: No exercise limitations does have arthritis and exogenous obesity  3.  Depression/mood: No history of depression or mood disorder  4.  Hearing: No deficits  5.  ADL's: Independent in all aspects of daily living  6.  Fall risk: Low  7.  Home safety: No problems identified  8.  Height weight, and visual acuity; height and weight stable no change in visual acuity does have annual eye examinations  9.  Counseling: Weight loss exercise vitamin D supplementation all encouraged  10. Lab orders based on risk factors: Laboratory profile reviewed lipid panel well controlled  11. Referral: Followup ophthalmology GI and GYN  12. Care plan: Weight loss exercise low salt diet all encouraged  13. Cognitive assessment: Alert and oriented with normal affect no cognitive dysfunction        Review of  Systems  Constitutional: Negative for fever, appetite change, fatigue and unexpected weight change.  HENT: Negative for hearing loss, ear pain, nosebleeds, congestion, sore throat, mouth sores, trouble swallowing, neck stiffness, dental problem, voice change, sinus pressure and tinnitus.   Eyes: Negative for photophobia, pain, redness and visual disturbance.  Respiratory: Negative for cough, chest tightness and shortness of breath.   Cardiovascular: Negative for chest pain, palpitations and leg swelling.  Gastrointestinal: Negative for  nausea, vomiting, abdominal pain, diarrhea, constipation, blood in stool, abdominal distention and rectal pain.  Genitourinary: Negative for dysuria, urgency, frequency, hematuria, flank pain, vaginal bleeding, vaginal discharge, difficulty urinating, genital sores, vaginal pain, menstrual problem and pelvic pain.  Musculoskeletal: Positive for back pain and arthralgias.  Skin: Negative for rash.  Neurological: Negative for dizziness, syncope, speech difficulty, weakness, light-headedness, numbness and headaches.  Hematological: Negative for adenopathy. Does not bruise/bleed easily.  Psychiatric/Behavioral: Negative for suicidal ideas, behavioral problems, self-injury, dysphoric mood and agitation. The patient is not nervous/anxious.        Objective:   Physical Exam  Constitutional: She is oriented to person, place, and time. She appears well-developed and well-nourished.  HENT:  Head: Normocephalic.  Right Ear: External ear normal.  Left Ear: External ear normal.  Mouth/Throat: Oropharynx is clear and moist.  Eyes: Conjunctivae and EOM are normal. Pupils are equal, round, and reactive to light.  Neck: Normal range of motion. Neck supple. No thyromegaly present.  Cardiovascular: Normal rate, regular rhythm and normal heart sounds.        The right dorsalis pedis pulse not easily palpable. Left dorsalis pedis pulse full  Pulmonary/Chest: Effort normal and breath sounds normal.       Partial left mastectomy  Abdominal: Soft. Bowel sounds are normal. She exhibits no mass. There is no tenderness.  Musculoskeletal: Normal range of motion.  Lymphadenopathy:    She has no cervical adenopathy.  Neurological: She is alert and oriented to person, place, and time.  Skin: Skin is warm and dry. No rash noted.  Psychiatric: She has a normal mood and affect. Her behavior is normal.          Assessment & Plan:   Preventive Health exam  HTN Dyslipidemia

## 2011-06-18 NOTE — Patient Instructions (Signed)
Limit your sodium (Salt) intake  Take a calcium supplement, plus [phone] units of vitamin D    It is important that you exercise regularly, at least 20 minutes 3 to 4 times per week.  If you develop chest pain or shortness of breath seek  medical attention.  You need to lose weight.  Consider a lower calorie diet and regular exercise.  Return in 6 months for follow-up

## 2011-08-03 ENCOUNTER — Other Ambulatory Visit: Payer: Self-pay | Admitting: Internal Medicine

## 2011-08-31 ENCOUNTER — Other Ambulatory Visit: Payer: Self-pay | Admitting: Internal Medicine

## 2011-09-03 ENCOUNTER — Encounter (INDEPENDENT_AMBULATORY_CARE_PROVIDER_SITE_OTHER): Payer: Self-pay | Admitting: General Surgery

## 2011-09-03 ENCOUNTER — Ambulatory Visit (INDEPENDENT_AMBULATORY_CARE_PROVIDER_SITE_OTHER): Payer: Medicare Other | Admitting: General Surgery

## 2011-09-03 ENCOUNTER — Other Ambulatory Visit (INDEPENDENT_AMBULATORY_CARE_PROVIDER_SITE_OTHER): Payer: Self-pay | Admitting: General Surgery

## 2011-09-03 VITALS — BP 162/96 | HR 72 | Temp 98.0°F | Resp 16 | Ht 66.0 in | Wt 246.0 lb

## 2011-09-03 DIAGNOSIS — Z853 Personal history of malignant neoplasm of breast: Secondary | ICD-10-CM

## 2011-09-03 NOTE — Patient Instructions (Signed)
Continue regular self exams Due for MRI with mammogram

## 2011-09-10 NOTE — Progress Notes (Signed)
Subjective:     Patient ID: Cynthia Herrera, female   DOB: 1946-06-21, 65 y.o.   MRN: [id number]  HPI The patient is a 65 year old white female who is now 10 years out of a left breast lumpectomy and axillary node dissection for a T2 N1 left breast cancer. She had a positive margin that was treated with chemotherapy and radiation. She is doing well and has no complaints today. Her appetite is good. Her bowels are working normally.  Review of Systems  Constitutional: Negative.   HENT: Negative.   Eyes: Negative.   Respiratory: Negative.   Cardiovascular: Negative.   Gastrointestinal: Negative.   Genitourinary: Negative.   Musculoskeletal: Negative.   Skin: Negative.   Neurological: Negative.   Hematological: Negative.   Psychiatric/Behavioral: Negative.        Objective:   Physical Exam  Constitutional: She is oriented to person, place, and time. She appears well-developed and well-nourished.  HENT:  Head: Normocephalic and atraumatic.  Eyes: Conjunctivae and EOM are normal. Pupils are equal, round, and reactive to light.  Neck: Normal range of motion. Neck supple.  Cardiovascular: Normal rate, regular rhythm and normal heart sounds.   Pulmonary/Chest: Effort normal and breath sounds normal.       Her left breast and axillary incisions have healed well. She does have some thickening beneath the scar which is stable. No other palpable mass in either breast. No axillary supraclavicular or cervical lymphadenopathy.  Abdominal: Soft. Bowel sounds are normal. She exhibits no mass. There is no tenderness.  Musculoskeletal: Normal range of motion.  Lymphadenopathy:    She has no cervical adenopathy.  Neurological: She is alert and oriented to person, place, and time.  Skin: Skin is warm and dry.  Psychiatric: She has a normal mood and affect. Her behavior is normal.       Assessment:     10 years out from a left breast lumpectomy and axillary node dissection    Plan:       At this point she seems to be doing well. She is due for her next mammogram in January. She will continue to do regular self exams. We will plan to see her back in one year

## 2011-09-29 ENCOUNTER — Ambulatory Visit (INDEPENDENT_AMBULATORY_CARE_PROVIDER_SITE_OTHER): Payer: Medicare Other | Admitting: Internal Medicine

## 2011-09-29 DIAGNOSIS — Z0000 Encounter for general adult medical examination without abnormal findings: Secondary | ICD-10-CM

## 2011-09-29 DIAGNOSIS — Z23 Encounter for immunization: Secondary | ICD-10-CM

## 2011-11-03 ENCOUNTER — Other Ambulatory Visit: Payer: Self-pay | Admitting: Internal Medicine

## 2011-11-03 DIAGNOSIS — Z1231 Encounter for screening mammogram for malignant neoplasm of breast: Secondary | ICD-10-CM

## 2011-11-05 ENCOUNTER — Ambulatory Visit: Payer: Medicare Other

## 2011-11-16 ENCOUNTER — Ambulatory Visit
Admission: RE | Admit: 2011-11-16 | Discharge: 2011-11-16 | Disposition: A | Discharge status: Home / Self Care | Payer: Medicare Other | Source: Ambulatory Visit | Attending: Internal Medicine | Admitting: Internal Medicine

## 2011-11-16 ENCOUNTER — Other Ambulatory Visit: Payer: Self-pay | Admitting: Internal Medicine

## 2011-11-16 DIAGNOSIS — Z1231 Encounter for screening mammogram for malignant neoplasm of breast: Secondary | ICD-10-CM

## 2011-11-17 LAB — HM MAMMOGRAPHY

## 2011-11-18 ENCOUNTER — Ambulatory Visit
Admission: RE | Admit: 2011-11-18 | Discharge: 2011-11-18 | Disposition: A | Discharge status: Home / Self Care | Payer: Medicare Other | Source: Ambulatory Visit | Attending: General Surgery | Admitting: General Surgery

## 2011-11-18 DIAGNOSIS — Z853 Personal history of malignant neoplasm of breast: Secondary | ICD-10-CM

## 2011-11-18 MED ORDER — GADOBENATE DIMEGLUMINE 529 MG/ML IV SOLN
20.0000 mL | Freq: Once | INTRAVENOUS | Status: AC | PRN
  Administered 2011-11-18: 20 mL via INTRAVENOUS

## 2012-02-04 ENCOUNTER — Other Ambulatory Visit: Payer: Self-pay | Admitting: Internal Medicine

## 2012-03-13 ENCOUNTER — Other Ambulatory Visit: Payer: Self-pay | Admitting: Gynecology

## 2012-06-22 ENCOUNTER — Other Ambulatory Visit: Payer: Self-pay | Admitting: Internal Medicine

## 2012-07-20 ENCOUNTER — Encounter: Payer: Self-pay | Admitting: Internal Medicine

## 2012-07-20 ENCOUNTER — Ambulatory Visit (INDEPENDENT_AMBULATORY_CARE_PROVIDER_SITE_OTHER): Payer: Medicare Other | Admitting: Internal Medicine

## 2012-07-20 VITALS — BP 140/90 | HR 68 | Temp 97.5°F | Resp 20 | Ht 65.0 in | Wt 249.0 lb

## 2012-07-20 DIAGNOSIS — Z2911 Encounter for prophylactic immunotherapy for respiratory syncytial virus (RSV): Secondary | ICD-10-CM

## 2012-07-20 DIAGNOSIS — Z86010 Personal history of colonic polyps: Secondary | ICD-10-CM

## 2012-07-20 DIAGNOSIS — I10 Essential (primary) hypertension: Secondary | ICD-10-CM

## 2012-07-20 DIAGNOSIS — Z23 Encounter for immunization: Secondary | ICD-10-CM

## 2012-07-20 DIAGNOSIS — R7309 Other abnormal glucose: Secondary | ICD-10-CM

## 2012-07-20 DIAGNOSIS — J449 Chronic obstructive pulmonary disease, unspecified: Secondary | ICD-10-CM

## 2012-07-20 DIAGNOSIS — R7302 Impaired glucose tolerance (oral): Secondary | ICD-10-CM

## 2012-07-20 DIAGNOSIS — Z0000 Encounter for general adult medical examination without abnormal findings: Secondary | ICD-10-CM

## 2012-07-20 DIAGNOSIS — E785 Hyperlipidemia, unspecified: Secondary | ICD-10-CM

## 2012-07-20 LAB — COMPREHENSIVE METABOLIC PANEL
ALT: 19 U/L (ref 0–35)
BUN: 12 mg/dL (ref 6–23)
CO2: 26 mEq/L (ref 19–32)
Calcium: 9.2 mg/dL (ref 8.4–10.5)
Chloride: 103 mEq/L (ref 96–112)
Creatinine, Ser: 0.7 mg/dL (ref 0.4–1.2)
GFR: 88.77 mL/min (ref >60.00)
Glucose, Bld: 104 mg/dL — ABNORMAL HIGH (ref 70–99)
Total Bilirubin: 0.7 mg/dL (ref 0.3–1.2)

## 2012-07-20 LAB — LIPID PANEL
Cholesterol: 147 mg/dL (ref 0–200)
HDL: 46.3 mg/dL (ref >39.00)
Triglycerides: 129 mg/dL (ref 0.0–149.0)

## 2012-07-20 LAB — CBC WITH DIFFERENTIAL/PLATELET
Basophils Relative: 0.6 % (ref 0.0–3.0)
Eosinophils Absolute: 0.2 10*3/uL (ref 0.0–0.7)
Hemoglobin: 13.2 g/dL (ref 12.0–15.0)
Lymphs Abs: 1.3 10*3/uL (ref 0.7–4.0)
Monocytes Absolute: 0.6 10*3/uL (ref 0.1–1.0)
Neutrophils Relative %: 73.4 % (ref 43.0–77.0)
Platelets: 347 10*3/uL (ref 150.0–400.0)
RBC: 4.41 Mil/uL (ref 3.87–5.11)
WBC: 7.9 10*3/uL (ref 4.5–10.5)

## 2012-07-20 MED ORDER — TRIAMCINOLONE ACETONIDE 0.1 % EX CREA: TOPICAL_CREAM | Freq: Two times a day (BID) | CUTANEOUS | Status: DC

## 2012-07-20 MED ORDER — IBUPROFEN 800 MG PO TABS: 800.0000 mg | ORAL_TABLET | Freq: Three times a day (TID) | ORAL | Status: DC | PRN

## 2012-07-20 MED ORDER — VALSARTAN-HYDROCHLOROTHIAZIDE 160-25 MG PO TABS: 1.0000 | ORAL_TABLET | Freq: Every day | ORAL | Status: DC

## 2012-07-20 MED ORDER — SIMVASTATIN 40 MG PO TABS: 40.0000 mg | ORAL_TABLET | Freq: Every day | ORAL | Status: DC

## 2012-07-20 MED ORDER — FLUTICASONE PROPIONATE 50 MCG/ACT NA SUSP: 1.0000 | Freq: Every day | NASAL | Status: DC

## 2012-07-20 MED ORDER — FEXOFENADINE-PSEUDOEPHED ER 60-120 MG PO TB12: 1.0000 | ORAL_TABLET | Freq: Two times a day (BID) | ORAL | Status: DC

## 2012-07-20 NOTE — Patient Instructions (Signed)
Limit your sodium (Salt) intake    It is important that you exercise regularly, at least 20 minutes 3 to 4 times per week.  If you develop chest pain or shortness of breath seek  medical attention.  You need to lose weight.  Consider a lower calorie diet and regular exercise.  Return in one year for follow-up 

## 2012-07-20 NOTE — Progress Notes (Signed)
Patient ID: Cynthia Herrera, female   DOB: [birth year], 66 y.o.   MRN: [id number]  Subjective:    Patient ID: Cynthia Herrera, female    DOB: Dec 08, 1945, 66 y.o.   MRN: [id number]  Hypertension Pertinent negatives include no chest pain, headaches, palpitations or shortness of breath.  Hyperlipidemia Pertinent negatives include no chest pain or shortness of breath.  66 -year-old patient who is seen today for a preventive health examination. She has a history of hypertension and dyslipidemia which has been stable. She is followed by Dr. Nicholas Lose and healing and does have a history of left breast cancer treated 11  years ago. She has a history also of colonic polyps and had a recent followup colonoscopy. She receives annual mammograms and gynecologic evaluations. She has a history of mild COPD and osteopenia. She no longer smokes. She did have a stress test performed approximately 4  years ago. No new concerns or complaints today  Past Medical History  Diagnosis Date  . Personal history of colonic polyps     adenomas 03 and 08  . Hyperlipidemia   . Hypertension   . HX: breast cancer   . Osteopenia   . Vitamin d deficiency   . Allergy     seasonal  . Arthritis   . Breast cancer     left: surgery,chemo,radiation  . COPD (chronic obstructive pulmonary disease)    Past Surgical History  Procedure Date  . Mastectomy July 2002    left  . Cholecystectomy 2004  . Colonoscopy 2003, September 2008, April 01, 2011    adenomas 03 and 08, polyp 12, diverticulosis  . Joint replacement     totally replacement surgery  . Tubal ligation   . Breast lumpectomy 2002  . Port-a-cath removal   . Total knee arthroplasty 2006    reports that she quit smoking about 10 years ago. She has never used smokeless tobacco. She reports that she does not drink alcohol or use illicit drugs. family history includes Cancer in her mother; Dementia in her sister; Diabetes in her brother, father, mother, and sister;  Heart disease in her brother and father; and Liver cancer in her mother. Allergies  Allergen Reactions  . Codeine Phosphate    Family History:  Reviewed history from 05/29/2009 and no changes required.  Family History Diabetes 1st degree relative  Fam hx MI  Family History Other cancer-Father died  at 71 of an MI; mother deceased from pancreatic cancer. Two brothers; one deceased. One status post CABG; one sister.- DM and dementia; History DVT. Both parents. Both brothers and one sister with diabetes;   Social History:  Reviewed history from 05/24/2008 and no changes required.  Former Smoker  Married  presently unemployed  [employer]  1. Risk factors, based on past  M,S,F history-  cardiovascular risk factors include hypertension and dyslipidemia. She also has a family history of coronary artery disease  2.  Physical activities: No exercise limitations does have arthritis and exogenous obesity  3.  Depression/mood: No history of depression or mood disorder  4.  Hearing: No deficits  5.  ADL's: Independent in all aspects of daily living  6.  Fall risk: Low  7.  Home safety: No problems identified  8.  Height weight, and visual acuity; height and weight stable no change in visual acuity does have annual eye examinations  9.  Counseling: Weight loss exercise vitamin D supplementation all encouraged  10. Lab orders based on risk factors: Laboratory  profile reviewed lipid panel well controlled  11. Referral: Followup ophthalmology GI and GYN  12. Care plan: Weight loss exercise low salt diet all encouraged  13. Cognitive assessment: Alert and oriented with normal affect no cognitive dysfunction        Review of Systems  Constitutional: Negative for fever, appetite change, fatigue and unexpected weight change.  HENT: Negative for hearing loss, ear pain, nosebleeds, congestion, sore throat, mouth sores, trouble swallowing, neck stiffness, dental problem, voice  change, sinus pressure and tinnitus.   Eyes: Negative for photophobia, pain, redness and visual disturbance.  Respiratory: Negative for cough, chest tightness and shortness of breath.   Cardiovascular: Negative for chest pain, palpitations and leg swelling.  Gastrointestinal: Negative for nausea, vomiting, abdominal pain, diarrhea, constipation, blood in stool, abdominal distention and rectal pain.  Genitourinary: Negative for dysuria, urgency, frequency, hematuria, flank pain, vaginal bleeding, vaginal discharge, difficulty urinating, genital sores, vaginal pain, menstrual problem and pelvic pain.  Musculoskeletal: Positive for back pain and arthralgias.  Skin: Negative for rash.  Neurological: Negative for dizziness, syncope, speech difficulty, weakness, light-headedness, numbness and headaches.  Hematological: Negative for adenopathy. Does not bruise/bleed easily.  Psychiatric/Behavioral: Negative for suicidal ideas, behavioral problems, self-injury, dysphoric mood and agitation. The patient is not nervous/anxious.        Objective:   Physical Exam  Constitutional: She is oriented to person, place, and time. She appears well-developed and well-nourished.  HENT:  Head: Normocephalic.  Right Ear: External ear normal.  Left Ear: External ear normal.  Mouth/Throat: Oropharynx is clear and moist.  Eyes: Conjunctivae normal and EOM are normal. Pupils are equal, round, and reactive to light.  Neck: Normal range of motion. Neck supple. No thyromegaly present.  Cardiovascular: Normal rate, regular rhythm and normal heart sounds.        The right dorsalis pedis pulse not easily palpable. Left dorsalis pedis pulse full  Pulmonary/Chest: Effort normal and breath sounds normal.       Partial left mastectomy  Abdominal: Soft. Bowel sounds are normal. She exhibits no mass. There is no tenderness.  Musculoskeletal: Normal range of motion.  Lymphadenopathy:    She has no cervical adenopathy.    Neurological: She is alert and oriented to person, place, and time.  Skin: Skin is warm and dry. No rash noted.       Soles of both feet left greater than the right quite dry flaky and thickened  Psychiatric: She has a normal mood and affect. Her behavior is normal.          Assessment & Plan:   Preventive Health exam  HTN Dyslipidemia History colonic polyps Remote left breast cancer  Follow general surgery Followup colonoscopy in 3 years Continue annual gynecologic visit

## 2012-08-29 ENCOUNTER — Encounter (INDEPENDENT_AMBULATORY_CARE_PROVIDER_SITE_OTHER): Payer: Self-pay | Admitting: General Surgery

## 2012-08-29 ENCOUNTER — Ambulatory Visit (INDEPENDENT_AMBULATORY_CARE_PROVIDER_SITE_OTHER): Payer: Medicare Other | Admitting: General Surgery

## 2012-08-29 VITALS — BP 128/84 | HR 73 | Temp 98.6°F | Ht 66.0 in | Wt 250.6 lb

## 2012-08-29 DIAGNOSIS — Z853 Personal history of malignant neoplasm of breast: Secondary | ICD-10-CM

## 2012-08-29 NOTE — Patient Instructions (Signed)
Continue to do regular self exams 

## 2012-08-31 ENCOUNTER — Encounter (INDEPENDENT_AMBULATORY_CARE_PROVIDER_SITE_OTHER): Payer: Self-pay | Admitting: General Surgery

## 2012-08-31 NOTE — Progress Notes (Signed)
Subjective:     Patient ID: Cynthia Herrera, female   DOB: 10-03-1946, 66 y.o.   MRN: [id number]  HPI The patient is a 66 her old white female who is 11 years status post left breast lumpectomy and axillary lymph node dissection for a T2 N1 breast cancer by Dr. Maple Hudson. She had a positive margin that was treated with chemotherapy and radiation therapy. She has been doing well over the last year and has no complaints. She does note that she recently lost her husband suddenly and is very tearful about this. Her last mammogram was in January that showed no evidence of malignancy. She also notes that she is unhappy with the scar and appearance of her left breast.  Review of Systems  Constitutional: Negative.   HENT: Negative.   Eyes: Negative.   Respiratory: Negative.   Cardiovascular: Negative.   Gastrointestinal: Negative.   Genitourinary: Negative.   Musculoskeletal: Negative.   Skin: Negative.   Neurological: Negative.   Hematological: Negative.   Psychiatric/Behavioral: Negative.        Objective:   Physical Exam  Constitutional: She is oriented to person, place, and time. She appears well-developed and well-nourished.  HENT:  Head: Normocephalic and atraumatic.  Eyes: Conjunctivae normal and EOM are normal. Pupils are equal, round, and reactive to light.  Neck: Normal range of motion. Neck supple.  Cardiovascular: Normal rate, regular rhythm and normal heart sounds.   Pulmonary/Chest: Effort normal and breath sounds normal.       There is some thickness to her scar on the left breast. There is also some contracture of the scar in the left breast. Otherwise there is no palpable mass in either breast. No palpable axillary or supraclavicular cervical lymphadenopathy.  Abdominal: Soft. Bowel sounds are normal. She exhibits no mass. There is no tenderness.  Musculoskeletal: Normal range of motion.  Lymphadenopathy:    She has no cervical adenopathy.  Neurological: She is alert and  oriented to person, place, and time.  Skin: Skin is warm and dry.  Psychiatric: She has a normal mood and affect. Her behavior is normal.       Assessment:     11 years status post left breast lumpectomy and axillary lymph node dissection    Plan:     At this point she seems to be doing reasonably well. She will continue to do regular self exams. We will plan to see her back in one year. We will refer her to Dr. Odis Luster and plastic surgery for an evaluation

## 2012-10-27 ENCOUNTER — Other Ambulatory Visit: Payer: Self-pay | Admitting: Internal Medicine

## 2012-10-27 DIAGNOSIS — Z9889 Other specified postprocedural states: Secondary | ICD-10-CM

## 2012-10-27 DIAGNOSIS — Z853 Personal history of malignant neoplasm of breast: Secondary | ICD-10-CM

## 2012-11-16 ENCOUNTER — Ambulatory Visit
Admission: RE | Admit: 2012-11-16 | Discharge: 2012-11-16 | Disposition: A | Discharge status: Home / Self Care | Payer: Medicare Other | Source: Ambulatory Visit | Attending: Internal Medicine | Admitting: Internal Medicine

## 2012-11-16 DIAGNOSIS — Z853 Personal history of malignant neoplasm of breast: Secondary | ICD-10-CM

## 2012-11-16 DIAGNOSIS — Z9889 Other specified postprocedural states: Secondary | ICD-10-CM

## 2012-12-01 ENCOUNTER — Encounter: Payer: Self-pay | Admitting: Internal Medicine

## 2012-12-06 ENCOUNTER — Encounter: Payer: Self-pay | Admitting: Internal Medicine

## 2012-12-06 ENCOUNTER — Ambulatory Visit: Payer: Medicare Other | Admitting: Internal Medicine

## 2012-12-06 ENCOUNTER — Ambulatory Visit (INDEPENDENT_AMBULATORY_CARE_PROVIDER_SITE_OTHER): Payer: Medicare Other | Admitting: Internal Medicine

## 2012-12-06 VITALS — BP 140/76 | HR 86 | Temp 97.4°F | Resp 20 | Wt 256.0 lb

## 2012-12-06 DIAGNOSIS — I10 Essential (primary) hypertension: Secondary | ICD-10-CM

## 2012-12-06 DIAGNOSIS — M549 Dorsalgia, unspecified: Secondary | ICD-10-CM

## 2012-12-06 MED ORDER — CYCLOBENZAPRINE HCL 10 MG PO TABS: 10.0000 mg | ORAL_TABLET | Freq: Three times a day (TID) | ORAL | Status: DC | PRN

## 2012-12-06 NOTE — Patient Instructions (Signed)
Use muscle relaxer at bedtime  Most patients with low back pain will improve with time over the next two to 6 weeks.  Keep active but avoid any activities that cause pain.  Apply moist heat to the low back area several times daily.  Back Exercises Back exercises help treat and prevent back injuries. The goal of back exercises is to increase the strength of your abdominal and back muscles and the flexibility of your back. These exercises should be started when you no longer have back pain. Back exercises include:  Pelvic Tilt. Lie on your back with your knees bent. Tilt your pelvis until the lower part of your back is against the floor. Hold this position 5 to 10 sec and repeat 5 to 10 times.  Knee to Chest. Pull first 1 knee up against your chest and hold for 20 to 30 seconds, repeat this with the other knee, and then both knees. This may be done with the other leg straight or bent, whichever feels better.  Sit-Ups or Curl-Ups. Bend your knees 90 degrees. Start with tilting your pelvis, and do a partial, slow sit-up, lifting your trunk only 30 to 45 degrees off the floor. Take at least 2 to 3 seconds for each sit-up. Do not do sit-ups with your knees out straight. If partial sit-ups are difficult, simply do the above but with only tightening your abdominal muscles and holding it as directed.  Hip-Lift. Lie on your back with your knees flexed 90 degrees. Push down with your feet and shoulders as you raise your hips a couple inches off the floor; hold for 10 seconds, repeat 5 to 10 times.  Back arches. Lie on your stomach, propping yourself up on bent elbows. Slowly press on your hands, causing an arch in your low back. Repeat 3 to 5 times. Any initial stiffness and discomfort should lessen with repetition over time.  Shoulder-Lifts. Lie face down with arms beside your body. Keep hips and torso pressed to floor as you slowly lift your head and shoulders off the floor. Do not overdo your exercises,  especially in the beginning. Exercises may cause you some mild back discomfort which lasts for a few minutes; however, if the pain is more severe, or lasts for more than 15 minutes, do not continue exercises until you see your caregiver. Improvement with exercise therapy for back problems is slow.  See your caregivers for assistance with developing a proper back exercise program. Document Released: 11/18/2004 Document Revised: 01/03/2012 Document Reviewed: 08/12/2011 [facility] Patient Information 2013 [place], [place]. Back Injury Prevention Back injuries can be extremely painful and difficult to heal. After having one back injury, you are much more likely to experience another later on. It is important to learn how to avoid injuring or re-injuring your back. The following tips can help you to prevent a back injury. PHYSICAL FITNESS  Exercise regularly and try to develop good tone in your abdominal muscles. Your abdominal muscles provide a lot of the support needed by your back.  Do aerobic exercises (walking, jogging, biking, swimming) regularly.  Do exercises that increase balance and strength (tai chi, yoga) regularly. This can decrease your risk of falling and injuring your back.  Stretch before and after exercising.  Maintain a healthy weight. The more you weigh, the more stress is placed on your back. For every pound of weight, 10 times that amount of pressure is placed on the back. DIET  Talk to your caregiver about how much calcium and vitamin  D you need per day. These nutrients help to prevent weakening of the bones (osteoporosis). Osteoporosis can cause broken (fractured) bones that lead to back pain.  Include good sources of calcium in your diet, such as dairy products, green, leafy vegetables, and products with calcium added (fortified).  Include good sources of vitamin D in your diet, such as milk and foods that are fortified with vitamin D.  Consider taking a nutritional  supplement or a multivitamin if needed.  Stop smoking if you smoke. POSTURE  Sit and stand up straight. Avoid leaning forward when you sit or hunching over when you stand.  Choose chairs with good low back (lumbar) support.  If you work at a desk, sit close to your work so you do not need to lean over. Keep your chin tucked in. Keep your neck drawn back and elbows bent at a right angle. Your arms should look like the letter "L."  Sit high and close to the steering wheel when you drive. Add a lumbar support to your car seat if needed.  Avoid sitting or standing in one position for too long. Take breaks to get up, stretch, and walk around at least once every hour. Take breaks if you are driving for long periods of time.  Sleep on your side with your knees slightly bent, or sleep on your back with a pillow under your knees. Do not sleep on your stomach. LIFTING, TWISTING, AND REACHING  Avoid heavy lifting, especially repetitive lifting. If you must do heavy lifting:  Stretch before lifting.  Work slowly.  Rest between lifts.  Use carts and dollies to move objects when possible.  Make several small trips instead of carrying 1 heavy load.  Ask for help when you need it.  Ask for help when moving big, awkward objects.  Follow these steps when lifting:  Stand with your feet shoulder-width apart.  Get as close to the object as you can. Do not try to pick up heavy objects that are far from your body.  Use handles or lifting straps if they are available.  Bend at your knees. Squat down, but keep your heels off the floor.  Keep your shoulders pulled back, your chin tucked in, and your back straight.  Lift the object slowly, tightening the muscles in your legs, abdomen, and buttocks. Keep the object as close to the center of your body as possible.  When you put a load down, use these same guidelines in reverse.  Do not:  Lift the object above your waist.  Twist at the waist  while lifting or carrying a load. Move your feet if you need to turn, not your waist.  Bend over without bending at your knees.  Avoid reaching over your head, across a table, or for an object on a high surface. OTHER TIPS  Avoid wet floors and keep sidewalks clear of ice to prevent falls.  Do not sleep on a mattress that is too soft or too hard.  Keep items that are used frequently within easy reach.  Put heavier objects on shelves at waist level and lighter objects on lower or higher shelves.  Find ways to decrease your stress, such as exercise, massage, or relaxation techniques. Stress can build up in your muscles. Tense muscles are more vulnerable to injury.  Seek treatment for depression or anxiety if needed. These conditions can increase your risk of developing back pain. SEEK MEDICAL CARE IF:  You injure your back.  You have questions  about diet, exercise, or other ways to prevent back injuries. MAKE SURE YOU:  Understand these instructions.  Will watch your condition.  Will get help right away if you are not doing well or get worse. Document Released: 11/18/2004 Document Revised: 01/03/2012 Document Reviewed: 11/22/2011 [facility] Patient Information 2013 [place], [place].

## 2012-12-06 NOTE — Progress Notes (Signed)
Subjective:    Patient ID: Cynthia Herrera, female    DOB: June 04, 1946, 67 y.o.   MRN: [id number]  HPI  67 year old patient who has treated hypertension and dyslipidemia. She presents today with a chief complaint of low back pain. This has been A. intermittent problem over the years. Over the past several months she has gained weight and has had considerable situational stress since the death of her husband in 07/01/23 of last year. Pain is aggravated by movement and worse in the morning when she first wakes up. She manages throughout the day with ibuprofen.  Past Medical History  Diagnosis Date  . Personal history of colonic polyps     adenomas 03 and 08  . Hyperlipidemia   . Hypertension   . HX: breast cancer   . Osteopenia   . Vitamin D deficiency   . Allergy     seasonal  . Arthritis   . Breast cancer     left: surgery,chemo,radiation  . COPD (chronic obstructive pulmonary disease)     History   Social History  . Marital Status: Married    Spouse Name: N/A    Number of Children: N/A  . Years of Education: N/A   Occupational History  . Not on file.   Social History Main Topics  . Smoking status: Former Smoker    Quit date: 08/24/2001  . Smokeless tobacco: Never Used  . Alcohol Use: No  . Drug Use: No  . Sexually Active: Not on file   Other Topics Concern  . Not on file   Social History Narrative  . No narrative on file    Past Surgical History  Procedure Laterality Date  . Mastectomy  July 2002    left  . Cholecystectomy  2004  . Colonoscopy  2003, September 2008, April 01, 2011    adenomas 03 and 08, polyp 12, diverticulosis  . Joint replacement      totally replacement surgery  . Tubal ligation    . Breast lumpectomy  2002  . Port-a-cath removal    . Total knee arthroplasty  2006    Family History  Problem Relation Age of Onset  . Dementia Sister   . Diabetes Sister   . Diabetes Brother   . Heart disease Brother   . Liver cancer Mother   .  Diabetes Mother   . Cancer Mother     pancreatic  . Diabetes Father   . Heart disease Father     Allergies  Allergen Reactions  . Codeine Phosphate     Current Outpatient Prescriptions on File Prior to Visit  Medication Sig Dispense Refill  . calcium carbonate (OS-CAL) 600 MG TABS Take by mouth. 2 dialy       . Cholecalciferol (VITAMIN D) 2000 UNITS tablet Take 2,000 Units by mouth daily.        . fexofenadine-pseudoephedrine (ALLEGRA-D) 60-120 MG per tablet Take 1 tablet by mouth 2 (two) times daily.  90 tablet  4  . fluticasone (FLONASE) 50 MCG/ACT nasal spray Place 1 spray into the nose daily.  16 g  6  . ibuprofen (ADVIL,MOTRIN) 800 MG tablet Take 1 tablet (800 mg total) by mouth every 8 (eight) hours as needed for pain.  90 tablet  6  . Multiple Vitamin (MULTIVITAMIN) tablet Take 1 tablet by mouth daily.        . simvastatin (ZOCOR) 40 MG tablet Take 1 tablet (40 mg total) by mouth daily.  90 tablet  6  .  triamcinolone cream (KENALOG) 0.1 % Apply topically 2 (two) times daily.  85.2 g  2  . valsartan-hydrochlorothiazide (DIOVAN HCT) 160-25 MG per tablet Take 1 tablet by mouth daily.  90 tablet  6  . ZOSTAVAX 16109 UNT/0.65ML injection        No current facility-administered medications on file prior to visit.    BP 140/76  Pulse 86  Temp(Src) 97.4 F (36.3 C) (Oral)  Resp 20  Wt 256 lb (116.121 kg)  BMI 41.34 kg/m2  SpO2 95%       Review of Systems  Musculoskeletal: Positive for back pain.       Objective:   Physical Exam  Constitutional: She is oriented to person, place, and time. She appears well-developed and well-nourished.  Overweight. Normal blood pressure  HENT:  Head: Normocephalic.  Right Ear: External ear normal.  Left Ear: External ear normal.  Mouth/Throat: Oropharynx is clear and moist.  Eyes: Conjunctivae and EOM are normal. Pupils are equal, round, and reactive to light.  Neck: Normal range of motion. Neck supple. No thyromegaly present.   Cardiovascular: Normal rate, regular rhythm, normal heart sounds and intact distal pulses.   Pulmonary/Chest: Effort normal and breath sounds normal.  Abdominal: Soft. Bowel sounds are normal. She exhibits no mass. There is no tenderness.  Musculoskeletal: Normal range of motion.  No focal lumbar tenderness  negative straight leg test  Lymphadenopathy:    She has no cervical adenopathy.  Neurological: She is alert and oriented to person, place, and time.  Skin: Skin is warm and dry. No rash noted.  Psychiatric: She has a normal mood and affect. Her behavior is normal.          Assessment & Plan:   Exacerbation of low back pain. Will continue ibuprofen. She does not feel she needs additional analgesics. We'll also place on a at bedtime dose of muscle relaxer Hypertension stable

## 2012-12-15 ENCOUNTER — Other Ambulatory Visit: Payer: Self-pay | Admitting: Internal Medicine

## 2012-12-15 NOTE — Telephone Encounter (Signed)
Pt has appt on Monday needs refill on cyclobenzaprine 10 mg #30 call into [facility] [phone]

## 2012-12-18 ENCOUNTER — Encounter: Payer: Self-pay | Admitting: Internal Medicine

## 2012-12-18 ENCOUNTER — Ambulatory Visit (INDEPENDENT_AMBULATORY_CARE_PROVIDER_SITE_OTHER): Payer: Medicare Other | Admitting: Internal Medicine

## 2012-12-18 VITALS — BP 140/80 | HR 86 | Temp 98.3°F | Resp 18 | Wt 261.0 lb

## 2012-12-18 DIAGNOSIS — M48061 Spinal stenosis, lumbar region without neurogenic claudication: Secondary | ICD-10-CM | POA: Insufficient documentation

## 2012-12-18 DIAGNOSIS — M549 Dorsalgia, unspecified: Secondary | ICD-10-CM

## 2012-12-18 MED ORDER — METHYLPREDNISOLONE ACETATE 80 MG/ML IJ SUSP
80.0000 mg | Freq: Once | INTRAMUSCULAR | Status: AC
  Administered 2012-12-18: 80 mg via INTRAMUSCULAR

## 2012-12-18 NOTE — Patient Instructions (Signed)
Spinal Stenosis One cause of back pain is spinal stenosis. Stenosis means abnormal narrowing. The spinal canal contains and protects the spinal nerve roots. In spinal stenosis, the spinal canal narrows and pinches the spinal cord and nerves. This causes low back pain and pain in the legs. Stenosis may pinch the nerves that control muscles and sensation in the legs. This leads to pain and abnormal feelings in the leg muscles and areas supplied by those nerves. CAUSES  Spinal stenosis often happens to people as they get older and arthritic boney growths occur in their spinal canal. There is also a loss of the disk height between the bones of the back, which also adds to this problem. Sometimes the problem is present at birth. SYMPTOMS   Pain that is generally worse with activities, particularly standing and walking.  Numbness, tingling, hot or cold feelings, weakness, or a weariness in the legs.  Clumsiness, frequent falling, and a foot-slapping gait, which may come as a result of nerve pressure and muscle weakness. DIAGNOSIS   Your caregiver may suspect spinal stenosis if you have unusual leg symptoms, such as those previously mentioned.  Your orthopedic surgeon may request special imaging exams, such a computerized magnetic scan (MRI) or computerized X-ray scan (CT) to find out the cause of the problem. TREATMENT   Sometimes treatments such as postural changes or nonsteroidal anti-inflammatory drugs will relieve the pain.  Nonsteroidal anti-inflammatory medications may help relieve symptoms. These medicines do this by decreasing swelling and inflammation in the nerves.  When stenosis causes severe nerve root compression, conservative treatment may not be enough to maintain a normal lifestyle. Surgery may be recommended to relieve the pressure on affected nerves. In properly selected patients, the results are very good, and patients are able to continue a normal lifestyle. HOME CARE  INSTRUCTIONS   Flexing the spine by leaning forward while walking may relieve symptoms. Lying with the knees drawn up to the chest may offer some relief. These positions enlarge the space available to the nerves. They may make it easier for stenosis sufferers to walk longer distances.  Rest, followed by gradually resuming activity, also can help.  Aerobic activity, such as bicycling or swimming, is often recommended.  Losing weight can also relieve some of the load on the spine.  Application of warm or cold compresses to the area of pain can be helpful. SEEK MEDICAL CARE IF:   The periods of relief between episodes of pain become shorter and shorter.  You experience pain that radiates down your leg, even when you are not standing or walking. SEEK IMMEDIATE MEDICAL CARE IF:   You have a loss of bowel or bladder control.  You have a sudden loss of feeling in your legs.  You suddenly cannot move your legs. Document Released: 01/01/2004 Document Revised: 01/03/2012 Document Reviewed: 02/26/2010 [employer] Patient Information 2013 [employer].  

## 2012-12-18 NOTE — Progress Notes (Signed)
Subjective:    Patient ID: Cynthia Herrera, female    DOB: May 09, 1946, 67 y.o.   MRN: [id number]  HPI  67 year old patient who has a history of moderate severe lumbar spinal stenosis. For the past 4 weeks she has had worsening buttock and leg pain. When seen 2 weeks ago she had bilateral buttock and leg pain at the left sided symptoms have largely resolved. She really has 3 little if any back pain. 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 Symptoms have been well-controlled with anti-inflammatory medications as well as at bedtime dose of muscle relaxers.  Past Medical History  Diagnosis Date  . Personal history of colonic polyps     adenomas 03 and 08  . Hyperlipidemia   . Hypertension   . HX: breast cancer   . Osteopenia   . Vitamin D deficiency   . Allergy     seasonal  . Arthritis   . Breast cancer     left: surgery,chemo,radiation  . COPD (chronic obstructive pulmonary disease)     History   Social History  . Marital Status: Married    Spouse Name: N/A    Number of Children: N/A  . Years of Education: N/A   Occupational History  . Not on file.   Social History Main Topics  . Smoking status: Former Smoker    Quit date: 08/24/2001  . Smokeless tobacco: Never Used  . Alcohol Use: No  . Drug Use: No  . Sexually Active: Not on file   Other Topics Concern  . Not on file   Social History Narrative  . No narrative on file    Past Surgical History  Procedure Laterality Date  . Mastectomy  July 2002    left  . Cholecystectomy  2004  . Colonoscopy  2003, September 2008, April 01, 2011    adenomas 03 and 08, polyp 12, diverticulosis  . Joint replacement      totally replacement surgery  . Tubal ligation    . Breast lumpectomy  2002  . Port-a-cath removal    . Total knee arthroplasty  2006    Family History  Problem Relation Age of Onset  . Dementia Sister   . Diabetes Sister   . Diabetes Brother   . Heart disease Brother   .  Liver cancer Mother   . Diabetes Mother   . Cancer Mother     pancreatic  . Diabetes Father   . Heart disease Father     Allergies  Allergen Reactions  . Codeine Phosphate     Current Outpatient Prescriptions on File Prior to Visit  Medication Sig Dispense Refill  . calcium carbonate (OS-CAL) 600 MG TABS Take by mouth. 2 dialy       . Cholecalciferol (VITAMIN D) 2000 UNITS tablet Take 2,000 Units by mouth daily.        . cyclobenzaprine (FLEXERIL) 10 MG tablet TAKE 1 TABLET BY MOUTH 3 TIMES A DAY AS NEEDED FOR MUSCLE SPASM  30 tablet  1  . fexofenadine-pseudoephedrine (ALLEGRA-D) 60-120 MG per tablet Take 1 tablet by mouth 2 (two) times daily.  90 tablet  4  . fluticasone (FLONASE) 50 MCG/ACT nasal spray Place 1 spray into the nose daily.  16 g  6  . ibuprofen (ADVIL,MOTRIN) 800 MG tablet Take 1 tablet (800 mg total) by mouth every 8 (eight) hours as needed for pain.  90 tablet  6  . Multiple Vitamin (MULTIVITAMIN) tablet Take  1 tablet by mouth daily.        . simvastatin (ZOCOR) 40 MG tablet Take 1 tablet (40 mg total) by mouth daily.  90 tablet  6  . triamcinolone cream (KENALOG) 0.1 % Apply topically 2 (two) times daily.  85.2 g  2  . valsartan-hydrochlorothiazide (DIOVAN HCT) 160-25 MG per tablet Take 1 tablet by mouth daily.  90 tablet  6   No current facility-administered medications on file prior to visit.    BP 140/80  Pulse 86  Temp(Src) 98.3 F (36.8 C) (Oral)  Resp 18  Wt 261 lb (118.389 kg)  BMI 42.15 kg/m2  SpO2 98%       Review of Systems  Constitutional: Negative.   HENT: Negative for hearing loss, congestion, sore throat, rhinorrhea, dental problem, sinus pressure and tinnitus.   Eyes: Negative for pain, discharge and visual disturbance.  Respiratory: Negative for cough and shortness of breath.   Cardiovascular: Negative for chest pain, palpitations and leg swelling.  Gastrointestinal: Negative for nausea, vomiting, abdominal pain, diarrhea,  constipation, blood in stool and abdominal distention.  Genitourinary: Negative for dysuria, urgency, frequency, hematuria, flank pain, vaginal bleeding, vaginal discharge, difficulty urinating, vaginal pain and pelvic pain.  Musculoskeletal: Positive for back pain and arthralgias. Negative for joint swelling and gait problem.  Skin: Negative for rash.  Neurological: Negative for dizziness, syncope, speech difficulty, weakness, numbness and headaches.  Hematological: Negative for adenopathy.  Psychiatric/Behavioral: Negative for behavioral problems, dysphoric mood and agitation. The patient is not nervous/anxious.        Objective:   Physical Exam  Constitutional: She appears well-developed and well-nourished. No distress.  Overweight. No distress. Blood pressure control  Musculoskeletal:  Negative straight leg test  Normal motor strength Reflexes diminished but symmetrical           Assessment & Plan:   Lumbar spinal stenosis. Patient has significant buttock and now right leg pain. Left leg pain has resolved;  options discussed.  The patient does not wish to consider surgery or referral at this time or even stronger analgesics. The diagnosis was discussed at length she will call if she needs additional pain medication and will be considered for a followup MRI if symptoms fail to improve or worsen

## 2013-01-11 ENCOUNTER — Encounter: Payer: Self-pay | Admitting: Internal Medicine

## 2013-01-11 ENCOUNTER — Ambulatory Visit (INDEPENDENT_AMBULATORY_CARE_PROVIDER_SITE_OTHER): Payer: Medicare Other | Admitting: Internal Medicine

## 2013-01-11 VITALS — BP 130/70 | HR 70 | Temp 97.4°F | Resp 18 | Wt 250.0 lb

## 2013-01-11 DIAGNOSIS — M48061 Spinal stenosis, lumbar region without neurogenic claudication: Secondary | ICD-10-CM

## 2013-01-11 DIAGNOSIS — M549 Dorsalgia, unspecified: Secondary | ICD-10-CM

## 2013-01-11 DIAGNOSIS — I10 Essential (primary) hypertension: Secondary | ICD-10-CM

## 2013-01-11 MED ORDER — HYDROCODONE-ACETAMINOPHEN 5-300 MG PO TABS: 1.0000 | ORAL_TABLET | Freq: Four times a day (QID) | ORAL | Status: DC | PRN

## 2013-01-11 NOTE — Patient Instructions (Signed)
Spinal Stenosis One cause of back pain is spinal stenosis. Stenosis means abnormal narrowing. The spinal canal contains and protects the spinal nerve roots. In spinal stenosis, the spinal canal narrows and pinches the spinal cord and nerves. This causes low back pain and pain in the legs. Stenosis may pinch the nerves that control muscles and sensation in the legs. This leads to pain and abnormal feelings in the leg muscles and areas supplied by those nerves. CAUSES  Spinal stenosis often happens to people as they get older and arthritic boney growths occur in their spinal canal. There is also a loss of the disk height between the bones of the back, which also adds to this problem. Sometimes the problem is present at birth. SYMPTOMS   Pain that is generally worse with activities, particularly standing and walking.  Numbness, tingling, hot or cold feelings, weakness, or a weariness in the legs.  Clumsiness, frequent falling, and a foot-slapping gait, which may come as a result of nerve pressure and muscle weakness. DIAGNOSIS   Your caregiver may suspect spinal stenosis if you have unusual leg symptoms, such as those previously mentioned.  Your orthopedic surgeon may request special imaging exams, such a computerized magnetic scan (MRI) or computerized X-ray scan (CT) to find out the cause of the problem. TREATMENT   Sometimes treatments such as postural changes or nonsteroidal anti-inflammatory drugs will relieve the pain.  Nonsteroidal anti-inflammatory medications may help relieve symptoms. These medicines do this by decreasing swelling and inflammation in the nerves.  When stenosis causes severe nerve root compression, conservative treatment may not be enough to maintain a normal lifestyle. Surgery may be recommended to relieve the pressure on affected nerves. In properly selected patients, the results are very good, and patients are able to continue a normal lifestyle. HOME CARE  INSTRUCTIONS   Flexing the spine by leaning forward while walking may relieve symptoms. Lying with the knees drawn up to the chest may offer some relief. These positions enlarge the space available to the nerves. They may make it easier for stenosis sufferers to walk longer distances.  Rest, followed by gradually resuming activity, also can help.  Aerobic activity, such as bicycling or swimming, is often recommended.  Losing weight can also relieve some of the load on the spine.  Application of warm or cold compresses to the area of pain can be helpful. SEEK MEDICAL CARE IF:   The periods of relief between episodes of pain become shorter and shorter.  You experience pain that radiates down your leg, even when you are not standing or walking. SEEK IMMEDIATE MEDICAL CARE IF:   You have a loss of bowel or bladder control.  You have a sudden loss of feeling in your legs.  You suddenly cannot move your legs. Document Released: 01/01/2004 Document Revised: 01/03/2012 Document Reviewed: 02/26/2010 [employer] Patient Information 2013 [employer].  

## 2013-01-11 NOTE — Progress Notes (Signed)
Subjective:    Patient ID: Cynthia Herrera, female    DOB: 12-30-1945, 67 y.o.   MRN: [id number]  HPI  67 year old patient who is seen today for followup. She continues to have significant back and right leg pain which is aggravated by walking. She has a history of moderate severe spinal stenosis documented on a lumbar MRI from 2009.  Pain is lifestyle limiting. She's had a difficult time working 2 days of the week which requires considerable walking on hard surfaces.  Past Medical History  Diagnosis Date  . Personal history of colonic polyps     adenomas 03 and 08  . Hyperlipidemia   . Hypertension   . HX: breast cancer   . Osteopenia   . Vitamin D deficiency   . Allergy     seasonal  . Arthritis   . Breast cancer     left: surgery,chemo,radiation  . COPD (chronic obstructive pulmonary disease)     History   Social History  . Marital Status: Married    Spouse Name: N/A    Number of Children: N/A  . Years of Education: N/A   Occupational History  . Not on file.   Social History Main Topics  . Smoking status: Former Smoker    Quit date: 08/24/2001  . Smokeless tobacco: Never Used  . Alcohol Use: No  . Drug Use: No  . Sexually Active: Not on file   Other Topics Concern  . Not on file   Social History Narrative  . No narrative on file    Past Surgical History  Procedure Laterality Date  . Mastectomy  July 2002    left  . Cholecystectomy  2004  . Colonoscopy  2003, September 2008, April 01, 2011    adenomas 03 and 08, polyp 12, diverticulosis  . Joint replacement      totally replacement surgery  . Tubal ligation    . Breast lumpectomy  2002  . Port-a-cath removal    . Total knee arthroplasty  2006    Family History  Problem Relation Age of Onset  . Dementia Sister   . Diabetes Sister   . Diabetes Brother   . Heart disease Brother   . Liver cancer Mother   . Diabetes Mother   . Cancer Mother     pancreatic  . Diabetes Father   . Heart disease  Father     Allergies  Allergen Reactions  . Codeine Phosphate     Current Outpatient Prescriptions on File Prior to Visit  Medication Sig Dispense Refill  . calcium carbonate (OS-CAL) 600 MG TABS Take by mouth. 2 dialy       . Cholecalciferol (VITAMIN D) 2000 UNITS tablet Take 2,000 Units by mouth daily.        . cyclobenzaprine (FLEXERIL) 10 MG tablet TAKE 1 TABLET BY MOUTH 3 TIMES A DAY AS NEEDED FOR MUSCLE SPASM  30 tablet  1  . fexofenadine-pseudoephedrine (ALLEGRA-D) 60-120 MG per tablet Take 1 tablet by mouth 2 (two) times daily.  90 tablet  4  . fluticasone (FLONASE) 50 MCG/ACT nasal spray Place 1 spray into the nose daily.  16 g  6  . ibuprofen (ADVIL,MOTRIN) 800 MG tablet Take 1 tablet (800 mg total) by mouth every 8 (eight) hours as needed for pain.  90 tablet  6  . Multiple Vitamin (MULTIVITAMIN) tablet Take 1 tablet by mouth daily.        . simvastatin (ZOCOR) 40 MG tablet Take 1  tablet (40 mg total) by mouth daily.  90 tablet  6  . triamcinolone cream (KENALOG) 0.1 % Apply topically 2 (two) times daily.  85.2 g  2  . valsartan-hydrochlorothiazide (DIOVAN HCT) 160-25 MG per tablet Take 1 tablet by mouth daily.  90 tablet  6   No current facility-administered medications on file prior to visit.    BP 130/70  Pulse 70  Temp(Src) 97.4 F (36.3 C) (Oral)  Resp 18  Wt 250 lb (113.399 kg)  BMI 40.37 kg/m2  SpO2 90%       Review of Systems  Musculoskeletal: Positive for back pain and gait problem.       Objective:   Physical Exam  Constitutional: She appears well-developed and well-nourished. No distress.  Musculoskeletal:  Able to stand on her heels and toes without motor weakness          Assessment & Plan:   Progressive lumbar stenosis. Patient remains symptomatic. We'll obtain a followup lumbar MRI. Likely will need neurosurgical referral. We'll treat symptomatically

## 2013-01-17 ENCOUNTER — Ambulatory Visit
Admission: RE | Admit: 2013-01-17 | Discharge: 2013-01-17 | Disposition: A | Discharge status: Home / Self Care | Payer: Medicare Other | Source: Ambulatory Visit | Attending: Internal Medicine | Admitting: Internal Medicine

## 2013-01-17 DIAGNOSIS — M549 Dorsalgia, unspecified: Secondary | ICD-10-CM

## 2013-01-17 DIAGNOSIS — M48061 Spinal stenosis, lumbar region without neurogenic claudication: Secondary | ICD-10-CM

## 2013-01-19 ENCOUNTER — Telehealth: Payer: Self-pay | Admitting: Internal Medicine

## 2013-01-19 ENCOUNTER — Other Ambulatory Visit: Payer: Self-pay | Admitting: Internal Medicine

## 2013-01-19 DIAGNOSIS — M5136 Other intervertebral disc degeneration, lumbar region: Secondary | ICD-10-CM

## 2013-01-19 DIAGNOSIS — M48061 Spinal stenosis, lumbar region without neurogenic claudication: Secondary | ICD-10-CM

## 2013-01-19 MED ORDER — CYCLOBENZAPRINE HCL 10 MG PO TABS: 10.0000 mg | ORAL_TABLET | Freq: Three times a day (TID) | ORAL | Status: DC | PRN

## 2013-01-19 NOTE — Telephone Encounter (Signed)
Pt notified Rx refill done for Flexeril.

## 2013-01-19 NOTE — Telephone Encounter (Signed)
Referral to Neurosurgeon done. Pt aware.

## 2013-01-19 NOTE — Telephone Encounter (Signed)
Patient called stating that she need a refill of her flexeril 10 mg 1po tid prn for muscle spasms sent to [facility] on [address]. Please assist.

## 2013-01-19 NOTE — Telephone Encounter (Signed)
Patient called stating that she would like a call back with MRI results. Please assist.  °

## 2013-05-01 ENCOUNTER — Other Ambulatory Visit: Payer: Self-pay | Admitting: Internal Medicine

## 2013-05-03 ENCOUNTER — Other Ambulatory Visit: Payer: Self-pay | Admitting: Internal Medicine

## 2013-08-04 ENCOUNTER — Other Ambulatory Visit: Payer: Self-pay | Admitting: Internal Medicine

## 2013-08-30 ENCOUNTER — Other Ambulatory Visit: Payer: Self-pay | Admitting: Internal Medicine

## 2013-09-04 ENCOUNTER — Telehealth: Payer: Self-pay | Admitting: Internal Medicine

## 2013-09-04 MED ORDER — HYDROCODONE-ACETAMINOPHEN 5-300 MG PO TABS: ORAL_TABLET | ORAL | Status: DC

## 2013-09-04 NOTE — Telephone Encounter (Signed)
Pt notified Rx ready for pickup. Rx printed and signed put at front desk for pick up.

## 2013-09-04 NOTE — Telephone Encounter (Signed)
Pt request refill of Hydrocodone-Acetaminophen 5-300 MG TABS  #90

## 2013-09-05 ENCOUNTER — Ambulatory Visit (INDEPENDENT_AMBULATORY_CARE_PROVIDER_SITE_OTHER): Payer: Medicare Other | Admitting: General Surgery

## 2013-09-05 ENCOUNTER — Encounter (INDEPENDENT_AMBULATORY_CARE_PROVIDER_SITE_OTHER): Payer: Self-pay | Admitting: General Surgery

## 2013-09-05 VITALS — BP 142/70 | HR 92 | Temp 97.8°F | Resp 25 | Ht 66.0 in | Wt 254.0 lb

## 2013-09-05 DIAGNOSIS — Z853 Personal history of malignant neoplasm of breast: Secondary | ICD-10-CM

## 2013-09-05 NOTE — Progress Notes (Signed)
Subjective:     Patient ID: Cynthia Herrera, female   DOB: 1945-12-31, 67 y.o.   MRN: [id number]  HPI The patient is a 67 year old white female who is 12 years status post left breast lumpectomy and axillary lymph node dissection for a T2 N1 breast cancer. This was performed by Dr. Maple Hudson. She did have a positive margin that was treated with chemotherapy and radiation therapy. She has been experiencing increasing pain in the left breast. She denies any discharge from the nipple. She also received a letter after her last mammogram stating that her mammogram was probably inadequate given the density of the scar in her left breast.  Review of Systems  Constitutional: Negative.   HENT: Negative.   Eyes: Negative.   Respiratory: Negative.   Cardiovascular: Positive for chest pain.  Gastrointestinal: Negative.   Endocrine: Negative.   Genitourinary: Negative.   Musculoskeletal: Negative.   Skin: Negative.   Allergic/Immunologic: Negative.   Neurological: Negative.   Hematological: Negative.   Psychiatric/Behavioral: Negative.        Objective:   Physical Exam  Constitutional: She is oriented to person, place, and time. She appears well-developed and well-nourished.  HENT:  Head: Normocephalic and atraumatic.  Eyes: Conjunctivae and EOM are normal. Pupils are equal, round, and reactive to light.  Neck: Normal range of motion. Neck supple.  Cardiovascular: Normal rate, regular rhythm and normal heart sounds.   Pulmonary/Chest: Effort normal and breath sounds normal.  There is a palpable firmness in the medial left breast in the area of her old scar.. There is no palpable mass in the right breast. There is no palpable axillary, supraclavicular, or cervical lymphadenopathy.  Abdominal: Soft. Bowel sounds are normal. She exhibits no mass. There is no tenderness.  Musculoskeletal: Normal range of motion.  Lymphadenopathy:    She has no cervical adenopathy.  Neurological: She is alert and  oriented to person, place, and time.  Skin: Skin is warm and dry.  Psychiatric: She has a normal mood and affect. Her behavior is normal.       Assessment:     The patient is 12 years status post left breast lumpectomy and axillary lymph node dissection for breast cancer. She did have a positive margin.     Plan:     Given the increasing pain and firmness in the medial left breast with her history of a positive margin and the believe that her mammogram imaging is inadequate due to the density of her breast I think it would be best for her to be evaluated with an MRI study of her breast. We will order this study and call her with the results. If this is negative then I will plan to see her back in one year

## 2013-09-05 NOTE — Patient Instructions (Addendum)
Continue regular self exams Will order mri

## 2013-09-06 ENCOUNTER — Telehealth (INDEPENDENT_AMBULATORY_CARE_PROVIDER_SITE_OTHER): Payer: Self-pay | Admitting: *Deleted

## 2013-09-06 ENCOUNTER — Other Ambulatory Visit (INDEPENDENT_AMBULATORY_CARE_PROVIDER_SITE_OTHER): Payer: Self-pay | Admitting: General Surgery

## 2013-09-06 DIAGNOSIS — Z853 Personal history of malignant neoplasm of breast: Secondary | ICD-10-CM

## 2013-09-06 DIAGNOSIS — N6320 Unspecified lump in the left breast, unspecified quadrant: Secondary | ICD-10-CM

## 2013-09-06 NOTE — Telephone Encounter (Signed)
I spoke with pt to inform her of her appts as follows:  MM/US at [facility] on 09/25/13 with an arrival time of 8:45am.  MRI at [facility] on 09/26/13 with an arrival time of 10:15am.  Pt agreeable to these appts and is aware of the locations of both appts.

## 2013-09-25 ENCOUNTER — Other Ambulatory Visit (INDEPENDENT_AMBULATORY_CARE_PROVIDER_SITE_OTHER): Payer: Self-pay | Admitting: General Surgery

## 2013-09-25 ENCOUNTER — Ambulatory Visit
Admission: RE | Admit: 2013-09-25 | Discharge: 2013-09-25 | Disposition: A | Discharge status: Home / Self Care | Payer: Medicare Other | Source: Ambulatory Visit | Attending: General Surgery | Admitting: General Surgery

## 2013-09-25 DIAGNOSIS — Z853 Personal history of malignant neoplasm of breast: Secondary | ICD-10-CM

## 2013-09-25 DIAGNOSIS — N6320 Unspecified lump in the left breast, unspecified quadrant: Secondary | ICD-10-CM

## 2013-09-26 ENCOUNTER — Ambulatory Visit
Admission: RE | Admit: 2013-09-26 | Discharge: 2013-09-26 | Disposition: A | Discharge status: Home / Self Care | Payer: Medicare Other | Source: Ambulatory Visit | Attending: General Surgery | Admitting: General Surgery

## 2013-09-26 DIAGNOSIS — Z853 Personal history of malignant neoplasm of breast: Secondary | ICD-10-CM

## 2013-09-26 MED ORDER — GADOBENATE DIMEGLUMINE 529 MG/ML IV SOLN
20.0000 mL | Freq: Once | INTRAVENOUS | Status: AC | PRN
  Administered 2013-09-26: 20 mL via INTRAVENOUS

## 2013-10-01 ENCOUNTER — Telehealth (INDEPENDENT_AMBULATORY_CARE_PROVIDER_SITE_OTHER): Payer: Self-pay

## 2013-10-01 NOTE — Telephone Encounter (Signed)
Message copied by Brennan Bailey on Mon Oct 01, 2013  2:49 PM ------      Message from: Caleen Essex III      Created: Fri Sep 28, 2013  1:39 PM       Looks neg ------

## 2013-10-01 NOTE — Telephone Encounter (Signed)
Called pt with negative MRI results. Recommend 6 month MRI follow up

## 2013-10-12 ENCOUNTER — Emergency Department (HOSPITAL_COMMUNITY): Payer: Medicare Other

## 2013-10-12 ENCOUNTER — Telehealth: Payer: Self-pay | Admitting: Internal Medicine

## 2013-10-12 ENCOUNTER — Encounter (HOSPITAL_COMMUNITY): Payer: Self-pay | Admitting: Emergency Medicine

## 2013-10-12 ENCOUNTER — Other Ambulatory Visit: Payer: Self-pay | Admitting: Internal Medicine

## 2013-10-12 ENCOUNTER — Emergency Department (HOSPITAL_COMMUNITY)
Admission: EM | Admit: 2013-10-12 | Discharge: 2013-10-13 | Disposition: A | Discharge status: Home / Self Care | Payer: Medicare Other | Attending: Emergency Medicine | Admitting: Emergency Medicine

## 2013-10-12 DIAGNOSIS — Z79899 Other long term (current) drug therapy: Secondary | ICD-10-CM | POA: Insufficient documentation

## 2013-10-12 DIAGNOSIS — D72829 Elevated white blood cell count, unspecified: Secondary | ICD-10-CM | POA: Insufficient documentation

## 2013-10-12 DIAGNOSIS — Z860100 Personal history of colon polyps, unspecified: Secondary | ICD-10-CM | POA: Insufficient documentation

## 2013-10-12 DIAGNOSIS — R0989 Other specified symptoms and signs involving the circulatory and respiratory systems: Secondary | ICD-10-CM | POA: Insufficient documentation

## 2013-10-12 DIAGNOSIS — M4800 Spinal stenosis, site unspecified: Secondary | ICD-10-CM | POA: Insufficient documentation

## 2013-10-12 DIAGNOSIS — Z853 Personal history of malignant neoplasm of breast: Secondary | ICD-10-CM | POA: Insufficient documentation

## 2013-10-12 DIAGNOSIS — J449 Chronic obstructive pulmonary disease, unspecified: Secondary | ICD-10-CM | POA: Insufficient documentation

## 2013-10-12 DIAGNOSIS — Z885 Allergy status to narcotic agent status: Secondary | ICD-10-CM | POA: Insufficient documentation

## 2013-10-12 DIAGNOSIS — J309 Allergic rhinitis, unspecified: Secondary | ICD-10-CM | POA: Insufficient documentation

## 2013-10-12 DIAGNOSIS — R0609 Other forms of dyspnea: Secondary | ICD-10-CM | POA: Insufficient documentation

## 2013-10-12 DIAGNOSIS — J4489 Other specified chronic obstructive pulmonary disease: Secondary | ICD-10-CM | POA: Insufficient documentation

## 2013-10-12 DIAGNOSIS — M129 Arthropathy, unspecified: Secondary | ICD-10-CM | POA: Insufficient documentation

## 2013-10-12 DIAGNOSIS — M7989 Other specified soft tissue disorders: Secondary | ICD-10-CM | POA: Insufficient documentation

## 2013-10-12 DIAGNOSIS — R0600 Dyspnea, unspecified: Secondary | ICD-10-CM

## 2013-10-12 DIAGNOSIS — E559 Vitamin D deficiency, unspecified: Secondary | ICD-10-CM | POA: Insufficient documentation

## 2013-10-12 DIAGNOSIS — R000 Tachycardia, unspecified: Secondary | ICD-10-CM | POA: Insufficient documentation

## 2013-10-12 DIAGNOSIS — I10 Essential (primary) hypertension: Secondary | ICD-10-CM | POA: Insufficient documentation

## 2013-10-12 DIAGNOSIS — I517 Cardiomegaly: Secondary | ICD-10-CM | POA: Insufficient documentation

## 2013-10-12 DIAGNOSIS — E785 Hyperlipidemia, unspecified: Secondary | ICD-10-CM | POA: Insufficient documentation

## 2013-10-12 DIAGNOSIS — Z87891 Personal history of nicotine dependence: Secondary | ICD-10-CM | POA: Insufficient documentation

## 2013-10-12 DIAGNOSIS — M899 Disorder of bone, unspecified: Secondary | ICD-10-CM | POA: Insufficient documentation

## 2013-10-12 DIAGNOSIS — R911 Solitary pulmonary nodule: Secondary | ICD-10-CM

## 2013-10-12 LAB — CBC WITH DIFFERENTIAL/PLATELET
Basophils Relative: 0 % (ref 0–1)
HCT: 39.4 % (ref 36.0–46.0)
Hemoglobin: 13.3 g/dL (ref 12.0–15.0)
MCHC: 33.8 g/dL (ref 30.0–36.0)
Monocytes Absolute: 1.2 10*3/uL — ABNORMAL HIGH (ref 0.1–1.0)
Monocytes Relative: 7 % (ref 3–12)
Neutro Abs: 14.9 10*3/uL — ABNORMAL HIGH (ref 1.7–7.7)

## 2013-10-12 LAB — COMPREHENSIVE METABOLIC PANEL
ALT: 17 U/L (ref 0–35)
AST: 13 U/L (ref 0–37)
Albumin: 3.7 g/dL (ref 3.5–5.2)
Alkaline Phosphatase: 92 U/L (ref 39–117)
BUN: 23 mg/dL (ref 6–23)
CO2: 20 mEq/L (ref 19–32)
Calcium: 9.6 mg/dL (ref 8.4–10.5)
Chloride: 103 mEq/L (ref 96–112)
Total Protein: 7.1 g/dL (ref 6.0–8.3)

## 2013-10-12 LAB — D-DIMER, QUANTITATIVE: D-Dimer, Quant: 0.91 ug/mL-FEU — ABNORMAL HIGH (ref 0.00–0.48)

## 2013-10-12 MED ORDER — IOHEXOL 350 MG/ML SOLN
100.0000 mL | Freq: Once | INTRAVENOUS | Status: AC | PRN
  Administered 2013-10-12: 100 mL via INTRAVENOUS

## 2013-10-12 NOTE — ED Notes (Signed)
The pt is c/o some sob since this am.  She called her doctor but the pt did not want to come in.  No previous history.  No pain

## 2013-10-12 NOTE — ED Provider Notes (Addendum)
CSN: [id number]     Arrival date & time 10/12/13  1909 History   First MD Initiated Contact with Patient 10/12/13 2124     Chief Complaint  Patient presents with  . Shortness of Breath   (Consider location/radiation/quality/duration/timing/severity/associated sxs/prior Treatment) HPI  This is a 67 year old female who presents emergency Department with acute onset shortness of breath.  Patient states she awoke from sleep roughly 4 AM with gurgling in her chest.  Patient states she got up to go to the bathroom when she came back she was acutely short of breath.  She she was "panting for air."  The patient has a history of COPD patient denies a history of CHF or MI.  She denies any fevers, productive cough.  The patient has had decreased mobility secondary to chronic spinal stenosis.  She states that her pain has been going on since April of this past year.  She has had weight gain and deconditioning secondary to her back pain. She states that she has acutely worsening DOE since her episode this morning. She denies DVT/PE hx. The patient underwent a spinal epidural injection 2 days ago.  Denies fevers, chills, myalgias, arthralgias. .Denies dysuria, flank pain, suprapubic pain, frequency, urgency, or hematuria. Denies headaches, light headedness, weakness, visual disturbances. Denies abdominal pain, nausea, vomiting, diarrhea or constipation.     Past Medical History  Diagnosis Date  . Personal history of colonic polyps     adenomas 03 and 08  . Hyperlipidemia   . Hypertension   . HX: breast cancer   . Osteopenia   . Vitamin D deficiency   . Allergy     seasonal  . Arthritis   . Breast cancer     left: surgery,chemo,radiation  . COPD (chronic obstructive pulmonary disease)    Past Surgical History  Procedure Laterality Date  . Mastectomy  July 2002    left  . Cholecystectomy  2004  . Colonoscopy  2003, September 2008, April 01, 2011    adenomas 03 and 08, polyp 12, diverticulosis   . Joint replacement      totally replacement surgery  . Tubal ligation    . Breast lumpectomy  2002  . Port-a-cath removal    . Total knee arthroplasty  2006   Family History  Problem Relation Age of Onset  . Dementia Sister   . Diabetes Sister   . Diabetes Brother   . Heart disease Brother   . Liver cancer Mother   . Diabetes Mother   . Cancer Mother     pancreatic  . Diabetes Father   . Heart disease Father    History  Substance Use Topics  . Smoking status: Former Smoker    Quit date: 08/24/2001  . Smokeless tobacco: Never Used  . Alcohol Use: No   OB History   Grav Para Term Preterm Abortions TAB SAB Ect Mult Living                 Review of Systems  Constitutional: Negative for fever and chills.  HENT: Negative for trouble swallowing.   Respiratory: Positive for shortness of breath. Negative for chest tightness and wheezing.   Cardiovascular: Positive for leg swelling (chronic leg swelling, worse on R from previous lymph node removal). Negative for chest pain.  Gastrointestinal: Negative for nausea, vomiting, abdominal pain, diarrhea and constipation.  Genitourinary: Negative for dysuria and hematuria.  Musculoskeletal: Negative for arthralgias and myalgias.  Skin: Negative for rash.  Neurological: Negative for numbness.  All other systems reviewed and are negative.    Allergies  Codeine phosphate  Home Medications   Current Outpatient Rx  Name  Route  Sig  Dispense  Refill  . calcium carbonate (OS-CAL) 600 MG TABS   Oral   Take by mouth. 2 dialy          . Cholecalciferol (VITAMIN D) 2000 UNITS tablet   Oral   Take 2,000 Units by mouth daily.           . cyclobenzaprine (FLEXERIL) 10 MG tablet   Oral   Take 1 tablet (10 mg total) by mouth 3 (three) times daily as needed for muscle spasms.   30 tablet   2   . fexofenadine-pseudoephedrine (ALLEGRA-D) 60-120 MG per tablet   Oral   Take 1 tablet by mouth 2 (two) times daily.   90 tablet    4   . fluticasone (FLONASE) 50 MCG/ACT nasal spray      USE 1 SPRAY PER NOSTRIL DAILY   16 g   2   . Hydrocodone-Acetaminophen 5-300 MG TABS      TAKE 1 TABLET BY MOUTH EVERY 6 HOURS AS NEEDED   90 each   0   . ibuprofen (ADVIL,MOTRIN) 800 MG tablet      TAKE 1 TABLET BY MOUTH EVERY 8 HOURS AS NEEDED FOR PAIN   90 tablet   0   . Multiple Vitamin (MULTIVITAMIN) tablet   Oral   Take 1 tablet by mouth daily.           . simvastatin (ZOCOR) 40 MG tablet   Oral   Take 1 tablet (40 mg total) by mouth daily.   90 tablet   6   . triamcinolone cream (KENALOG) 0.1 %   Topical   Apply topically 2 (two) times daily.   85.2 g   2   . valsartan-hydrochlorothiazide (DIOVAN-HCT) 160-25 MG per tablet      TAKE 1 TABLET BY MOUTH DAILY.   90 tablet   2    BP 155/75  Pulse 112  Temp(Src) 97.7 F (36.5 C) (Oral)  Resp 20  Wt 257 lb (116.574 kg)  SpO2 99% Physical Exam Physical Exam  Nursing note and vitals reviewed. Constitutional: She is oriented to person, place, and time. She appears well-developed and well-nourished. No distress.  HENT:  Head: Normocephalic and atraumatic.  Eyes: Conjunctivae normal and EOM are normal. Pupils are equal, round, and reactive to light. No scleral icterus.  Neck: Normal range of motion. No JVD Cardiovascular: Normal rate, regular rhythm and normal heart sounds.  Exam reveals no gallop and no friction rub.   No murmur heard. Pulmonary/Chest:increased  Effort. No wheezes or rales. No respiratory distress.  Abdominal: Soft. Bowel sounds are normal. She exhibits no distension and no mass. There is no tenderness. There is no guarding.  Neurological: She is alert and oriented to person, place, and time.  Skin: Skin is warm and dry. She is not diaphoretic.    ED Course  Procedures (including critical care time) Labs Review Labs Reviewed  CBC WITH DIFFERENTIAL - Abnormal; Notable for the following:    WBC 17.0 (*)    Platelets 442 (*)     Neutrophils Relative % 88 (*)    Neutro Abs 14.9 (*)    Lymphocytes Relative 5 (*)    Monocytes Absolute 1.2 (*)    All other components within normal limits  COMPREHENSIVE METABOLIC PANEL - Abnormal; Notable for the following:  Glucose, Bld 208 (*)    All other components within normal limits  PRO B NATRIURETIC PEPTIDE - Abnormal; Notable for the following:    Pro B Natriuretic peptide (BNP) 1518.0 (*)    All other components within normal limits  TROPONIN I   Imaging Review Dg Chest 2 View  10/12/2013   CLINICAL DATA:  Shortness of breath, cough  EXAM: CHEST  2 VIEW  COMPARISON:  03/02/2005  FINDINGS: Increased interstitial markings, possibly reflecting mild interstitial edema. Multifocal infection is considered less likely. No pleural effusion or pneumothorax.  Cardiomegaly. Postsurgical changes related to prior CABG.  Mild degenerative changes of the visualized thoracolumbar spine.  IMPRESSION: Cardiomegaly with possible mild interstitial edema.  Multifocal infection is considered less likely.   Electronically Signed   By: Charline Bills M.D.   On: 10/12/2013 20:38    EKG Interpretation   None       MDM   1. Dyspnea   2. Pulmonary nodule seen on imaging study    Leukocytosis with left shift. mild elevation in pro BNP is equivocal.  Her chest x-ray shows increased interstitial markings thought to be related to pulmonary edema however infection is not ruled out.  Patient's elevated d-dimer and will get CT angiogram of the chest.    BP 144/69  Pulse 105  Temp(Src) 97.7 F (36.5 C) (Oral)  Resp 27  Wt 257 lb (116.574 kg)  SpO2 95% Patient with clinically mixed picture. Tachycardia, cardiomegaly, PND, DOE, and  Interstitial markings consistent with pulmonary edema. Patient wbc count with left shift possible CAP. Patient with curb score of 2 making her low risk. Patient does not wish to be admitted and her 02 sats appear stable. Patient given lasix 20 mg IV here in the ED,  zpack for home. I have messaged her PCP asking for close follow up on Monday.  Discussed CTA findings including nodules. She understands findings and need for follow up. The patient appears reasonably screened and/or stabilized for discharge and I doubt any other medical condition or other [facility] requiring further screening, evaluation, or treatment in the ED at this time prior to discharge.    Arthor Captain, PA-C 10/13/13 1047  Arthor Captain, PA-C 10/31/13 1558

## 2013-10-12 NOTE — ED Notes (Signed)
Patient transported to CT 

## 2013-10-12 NOTE — ED Notes (Signed)
Pt reports acute onset of shortness of breath that awoke her from her sleep approx 3am today - pt states she was experiencing a "gurgling in her chest" that was unresolved w/ coughing - pt also admits to exertional shortness of breath as well. Pt denies any recent cough, illness, chest pain or fever. Pt noted to have +1 bilat lower extremity pitting edema, pt states she has had swelling more to her LLE ever since she had lymph nodes removed d/t breast CA in 2002.

## 2013-10-12 NOTE — ED Provider Notes (Signed)
Medical screening examination/treatment/procedure(s) were conducted as a shared visit with non-physician practitioner(s) and myself.  I personally evaluated the patient during the encounter.  EKG Interpretation    Date/Time:  Friday October 12 2013 19:17:01 EST Ventricular Rate:  109 PR Interval:  136 QRS Duration: 86 QT Interval:  336 QTC Calculation: 452 R Axis:   80 Text Interpretation:  Sinus tachycardia Biatrial enlargement Nonspecific ST and T wave abnormality Abnormal ECG Confirmed by Devone Bonilla  DO, Barbarita Hutmacher ([id number]) on 10/12/2013 9:53:09 PM            Pt is a 67 y.o. female with history of hypertension, hyperlipidemia and spinal stenosis receiving epidural injections who presents emergency department with chest congestion and acute onset shortness of breath, tachycardia. Denies a prior history of PE or DVT. On exam, she has some mild crackles diffusely. She is tachycardic. Otherwise hemodynamically stable. No hypoxia. No respiratory distress. She has a leukocytosis with left shift and possible interstitial edema verses multifocal infection.  Patient has positive d-dimer. Will obtain CT of her chest. Will admit.   Cynthia Maw Annaliz Aven, DO 10/12/13 2318

## 2013-10-12 NOTE — Telephone Encounter (Signed)
Patient Information:  Caller Name: [place]  Phone: [phone]  Patient: Cynthia Herrera, Cynthia Herrera  Gender: Female  DOB: 01-Mar-1946  Age: 67 Years  PCP: Eleonore Chiquito (Family Practice > 79yrs old)  Office Follow Up:  Does the office need to follow up with this patient?: No  Instructions For The Office: N/A   Symptoms  Reason For Call & Symptoms: During the night it seemed like her  lungs filled up and she was "gurgling".  Now it has subsided.  She does not know what happened.  Does she need a  medicaiton so this does not happen again. Per nursing judgement recommending that she be seen today for evaluation.  Reviewed Health History In EMR: Yes  Reviewed Medications In EMR: Yes  Reviewed Allergies In EMR: Yes  Reviewed Surgeries / Procedures: Yes  Date of Onset of Symptoms: 10/12/2013  Treatments Tried: Went outside in the cool air.  Treatments Tried Worked: No  Guideline(s) Used:  Breathing Difficulty  Disposition Per Guideline:   See Within 2 Weeks in [employer]  Reason For Disposition Reached:   Mild longstanding difficulty breathing (e.g., speaks in phrases, SOB even at rest, pulse 100-120) and same as normal  Advice Given:  N/A  Patient Will Follow Care Advice:  YES

## 2013-10-13 MED ORDER — AZITHROMYCIN 250 MG PO TABS: ORAL_TABLET | ORAL | Status: DC

## 2013-10-13 MED ORDER — FUROSEMIDE 10 MG/ML IJ SOLN
20.0000 mg | Freq: Once | INTRAMUSCULAR | Status: AC
  Administered 2013-10-13: 20 mg via INTRAVENOUS
  Filled 2013-10-13: qty 2

## 2013-10-13 NOTE — ED Notes (Signed)
Pt ambulating independently w/ steady gait on d/c in no acute distress, A&Ox4. D/c instructions reviewed w/ pt and family - pt and family deny any further questions or concerns at present. Rx given x1  

## 2013-10-14 NOTE — ED Provider Notes (Signed)
Medical screening examination/treatment/procedure(s) were performed by non-physician practitioner and as supervising physician I was immediately available for consultation/collaboration.  EKG Interpretation    Date/Time:  Friday October 12 2013 19:17:01 EST Ventricular Rate:  109 PR Interval:  136 QRS Duration: 86 QT Interval:  336 QTC Calculation: 452 R Axis:   80 Text Interpretation:  Sinus tachycardia Biatrial enlargement Nonspecific ST and T wave abnormality Abnormal ECG Confirmed by Charnice Zwilling  DO, Jayonna Meyering ([id number]) on 10/12/2013 9:53:09 PM              Layla Maw Codie Krogh, DO 10/14/13 1352

## 2013-10-15 ENCOUNTER — Telehealth: Payer: Self-pay | Admitting: Internal Medicine

## 2013-10-15 NOTE — Telephone Encounter (Signed)
Patient Information:  Caller Name: Winna  Phone: [phone]  Patient: Harriet Pho  Gender: Female  DOB: 12/05/1945  Age: 67 Years  PCP: Eleonore Chiquito (Family Practice > 41yrs old)  Office Follow Up:  Does the office need to follow up with this patient?: No  Instructions For The Office: N/A  RN Note:  States tried to get appt at office 10/12/13 but appts full and referred to UC.  States went to UC, and was referred to ED due to severity of symptoms.  Seen in ED and diagnosed with pneumonia; given zpack and diuretic IV, and sent home and told to follow up with PCP office.  States her breathing is improved since seen in ED.  Afebrile.  Has been taking azithromycin as directed.  Per breathing difficulty protocol, emergent symptoms currently denied; has appt for well visit 10/23/13 with Dr. Amador Cunas.  To keep that appt in follow up.  krs/can  Symptoms  Reason For Call & Symptoms: follow up after ED visit for shortness of breath, "gurgling in lungs."  Reviewed Health History In EMR: Yes  Reviewed Medications In EMR: Yes  Reviewed Allergies In EMR: Yes  Reviewed Surgeries / Procedures: Yes  Date of Onset of Symptoms: 10/11/2013  Guideline(s) Used:  Breathing Difficulty  Disposition Per Guideline:   See Within 2 Weeks in Office  Reason For Disposition Reached:   Mild longstanding difficulty breathing (e.g., speaks in phrases, SOB even at rest, pulse 100-120) and same as normal  Advice Given:  N/A  Patient Will Follow Care Advice:  YES  Appointment Scheduled:  10/23/2013 08:45:00 Appointment Scheduled Provider:  Eleonore Chiquito (Family Practice > 27yrs old)

## 2013-10-17 NOTE — Telephone Encounter (Signed)
°  Patient Information:  Caller Name: Adrieana  Phone: [phone]  Patient: Cynthia Herrera  Gender: Female  DOB: 1946-05-21  Age: 67 Years  PCP: Eleonore Chiquito (Family Practice > 60yrs old)  Office Follow Up:  Does the office need to follow up with this patient?: No  Instructions For The Office: N/A   Symptoms  Reason For Call & Symptoms: Pt is calling back to say her symptoms are worse. She was seen at the ED on 10/12/13. See notes. Pt was put on a zpac . She is calling back to see if MD can order anything for her. RN reviewed note in EPIC and advised pt is she is SOB and her lungs sound like they are "gurgling" that she needs to be seen at the ED. Pt refuses.  Reviewed Health History In EMR: Yes  Reviewed Medications In EMR: Yes  Reviewed Allergies In EMR: Yes  Reviewed Surgeries / Procedures: Yes  Date of Onset of Symptoms: 10/12/2013  Guideline(s) Used:  Cough  Disposition Per Guideline:   Go to ED Now  Reason For Disposition Reached:   Difficulty breathing  Advice Given:  N/A  Patient Refused Recommendation:  Patient Refused Care Advice  she will "wait" and see MD next week.

## 2013-10-23 ENCOUNTER — Ambulatory Visit (INDEPENDENT_AMBULATORY_CARE_PROVIDER_SITE_OTHER): Payer: Medicare Other | Admitting: Internal Medicine

## 2013-10-23 ENCOUNTER — Encounter: Payer: Self-pay | Admitting: Internal Medicine

## 2013-10-23 VITALS — BP 120/78 | HR 93 | Temp 98.4°F | Resp 20 | Ht 65.5 in | Wt 254.0 lb

## 2013-10-23 DIAGNOSIS — M48061 Spinal stenosis, lumbar region without neurogenic claudication: Secondary | ICD-10-CM

## 2013-10-23 DIAGNOSIS — I2510 Atherosclerotic heart disease of native coronary artery without angina pectoris: Secondary | ICD-10-CM

## 2013-10-23 DIAGNOSIS — Z86010 Personal history of colonic polyps: Secondary | ICD-10-CM

## 2013-10-23 DIAGNOSIS — I10 Essential (primary) hypertension: Secondary | ICD-10-CM

## 2013-10-23 DIAGNOSIS — I517 Cardiomegaly: Secondary | ICD-10-CM

## 2013-10-23 DIAGNOSIS — E785 Hyperlipidemia, unspecified: Secondary | ICD-10-CM

## 2013-10-23 DIAGNOSIS — Z0000 Encounter for general adult medical examination without abnormal findings: Secondary | ICD-10-CM

## 2013-10-23 DIAGNOSIS — Z23 Encounter for immunization: Secondary | ICD-10-CM

## 2013-10-23 DIAGNOSIS — J810 Acute pulmonary edema: Secondary | ICD-10-CM

## 2013-10-23 DIAGNOSIS — M549 Dorsalgia, unspecified: Secondary | ICD-10-CM

## 2013-10-23 DIAGNOSIS — I2584 Coronary atherosclerosis due to calcified coronary lesion: Secondary | ICD-10-CM

## 2013-10-23 DIAGNOSIS — Z853 Personal history of malignant neoplasm of breast: Secondary | ICD-10-CM

## 2013-10-23 MED ORDER — HYDROCODONE-ACETAMINOPHEN 5-300 MG PO TABS: ORAL_TABLET | ORAL | Status: DC

## 2013-10-23 NOTE — Progress Notes (Signed)
Patient ID: Cynthia Herrera, female   DOB: 1946/08/07, 67 y.o.   MRN: [id number]  Subjective:    Patient ID: Cynthia Herrera, female    DOB: 1946/09/24, 67 y.o.   MRN: [id number]  Hypertension Pertinent negatives include no chest pain, headaches, palpitations or shortness of breath.  Hyperlipidemia Pertinent negatives include no chest pain or shortness of breath.  36 -year-old patient who is seen today for a preventive health examination. She has a history of hypertension and dyslipidemia which has been stable. She is followed by Dr. Nicholas Lose and does have a history of left breast cancer treated 12  years ago. She has a history also of colonic polyps and had a recent followup colonoscopy. She receives annual mammograms and gynecologic evaluations. She has a history of mild COPD and osteopenia. She no longer smokes. She did have a stress test performed approximately 5 years ago.   The patient was seen in the ED on 12/19 complaining of dyspnea.  ED evaluation included a CT scan of the chest that was negative for pulmonary emboli. There was cardiomegaly interstitial edema and coronary artery calcification. There is also small bilateral pulmonary nodules. BNP was elevated She continues to have dyspnea on exertion with minimal activity. No exertional chest pain  Past Medical History  Diagnosis Date  . Personal history of colonic polyps     adenomas 03 and 08  . Hyperlipidemia   . Hypertension   . HX: breast cancer   . Osteopenia   . Vitamin D deficiency   . Allergy     seasonal  . Arthritis   . Breast cancer     left: surgery,chemo,radiation  . COPD (chronic obstructive pulmonary disease)    Past Surgical History  Procedure Laterality Date  . Mastectomy  July 2002    left  . Cholecystectomy  2004  . Colonoscopy  2003, September 2008, April 01, 2011    adenomas 03 and 08, polyp 12, diverticulosis  . Joint replacement      totally replacement surgery  . Tubal ligation    . Breast  lumpectomy  2002  . Port-a-cath removal    . Total knee arthroplasty  2006    reports that she quit smoking about 12 years ago. She has never used smokeless tobacco. She reports that she does not drink alcohol or use illicit drugs. family history includes Cancer in her mother; Dementia in her sister; Diabetes in her brother, father, mother, and sister; Heart disease in her brother and father; Liver cancer in her mother. Allergies  Allergen Reactions  . Codeine Phosphate Other (See Comments)    Weird feeling "   Family History:  Reviewed history from 05/29/2009 and no changes required.  Family History Diabetes 1st degree relative  Fam hx MI  Family History Other cancer-Father died  at 19 of an MI; mother deceased from pancreatic cancer. Two brothers; one deceased. One status post CABG; one sister.- DM and dementia; History DVT. Both parents. Both brothers and one sister with diabetes;   Social History:  Reviewed history from 05/24/2008 and no changes required.  Former Smoker  Married  presently unemployed  [employer]  1. Risk factors, based on past  M,S,F history-  cardiovascular risk factors include hypertension and dyslipidemia. She also has a family history of coronary artery disease  2.  Physical activities: No exercise limitations does have arthritis and exogenous obesity; history of DOE  for the past 2 weeks  3.  Depression/mood: No history  of depression or mood disorder  4.  Hearing: No deficits  5.  ADL's: Independent in all aspects of daily living  6.  Fall risk: Low  7.  Home safety: No problems identified  8.  Height weight, and visual acuity; height and weight stable no change in visual acuity does have annual eye examinations  9.  Counseling: Weight loss exercise vitamin D supplementation all encouraged  10. Lab orders based on risk factors: Laboratory profile reviewed lipid panel well controlled  11. Referral: Followup ophthalmology GI and GYN  12.  Care plan: Weight loss exercise low salt diet all encouraged  13. Cognitive assessment: Alert and oriented with normal affect no cognitive dysfunction        Review of Systems  Constitutional: Negative for fever, appetite change, fatigue and unexpected weight change.  HENT: Negative for congestion, dental problem, ear pain, hearing loss, mouth sores, nosebleeds, sinus pressure, sore throat, tinnitus, trouble swallowing and voice change.   Eyes: Negative for photophobia, pain, redness and visual disturbance.  Respiratory: Negative for cough, chest tightness and shortness of breath.   Cardiovascular: Negative for chest pain, palpitations and leg swelling.  Gastrointestinal: Negative for nausea, vomiting, abdominal pain, diarrhea, constipation, blood in stool, abdominal distention and rectal pain.  Genitourinary: Negative for dysuria, urgency, frequency, hematuria, flank pain, vaginal bleeding, vaginal discharge, difficulty urinating, genital sores, vaginal pain, menstrual problem and pelvic pain.  Musculoskeletal: Positive for arthralgias and back pain. Negative for neck stiffness.  Skin: Negative for rash.  Neurological: Negative for dizziness, syncope, speech difficulty, weakness, light-headedness, numbness and headaches.  Hematological: Negative for adenopathy. Does not bruise/bleed easily.  Psychiatric/Behavioral: Negative for suicidal ideas, behavioral problems, self-injury, dysphoric mood and agitation. The patient is not nervous/anxious.        Objective:   Physical Exam  Constitutional: She is oriented to person, place, and time. She appears well-developed and well-nourished.  HENT:  Head: Normocephalic.  Right Ear: External ear normal.  Left Ear: External ear normal.  Mouth/Throat: Oropharynx is clear and moist.  Eyes: Conjunctivae and EOM are normal. Pupils are equal, round, and reactive to light.  Neck: Normal range of motion. Neck supple. No thyromegaly present.   Cardiovascular: Normal rate, regular rhythm, normal heart sounds and intact distal pulses.   Pulmonary/Chest: Effort normal and breath sounds normal.  Partial left mastectomy  Abdominal: Soft. Bowel sounds are normal. She exhibits no mass. There is no tenderness.  Musculoskeletal: Normal range of motion. She exhibits edema.  +1 lower extremity edema  Lymphadenopathy:    She has no cervical adenopathy.  Neurological: She is alert and oriented to person, place, and time.  Skin: Skin is warm and dry. No rash noted.  Soles of both feet left greater than the right quite dry flaky and thickened  Psychiatric: She has a normal mood and affect. Her behavior is normal.          Assessment & Plan:   Preventive Health exam  HTN Dyslipidemia History colonic polyps Remote left breast cancer  Recent episode of what sounds like interstitial pulmonary edema. We'll check a 2-D echocardiogram and refer to cardiology.  Nonspecific pulmonary nodules. Doubt this is a cause of her dyspnea;  will need follow up in the future  Follow general surgery Followup colonoscopy in 2 years Continue annual gynecologic visit

## 2013-10-23 NOTE — Progress Notes (Signed)
Pre-visit discussion using our clinic review tool. No additional management support is needed unless otherwise documented below in the visit note.  

## 2013-10-23 NOTE — Patient Instructions (Signed)
Limit your sodium (Salt) intake  2-D echocardiogram as discussed Cardiology consultation as discussed  Return in 3 months for follow-up

## 2013-10-25 HISTORY — PX: CARDIAC CATHETERIZATION: SHX172

## 2013-10-26 ENCOUNTER — Other Ambulatory Visit: Payer: Self-pay | Admitting: Internal Medicine

## 2013-11-08 ENCOUNTER — Encounter: Payer: Self-pay | Admitting: Cardiology

## 2013-11-08 ENCOUNTER — Ambulatory Visit (INDEPENDENT_AMBULATORY_CARE_PROVIDER_SITE_OTHER): Payer: Medicare Other | Admitting: Cardiology

## 2013-11-08 ENCOUNTER — Inpatient Hospital Stay (HOSPITAL_COMMUNITY): Admission: AD | Admit: 2013-11-08 | Payer: Medicare Other | Source: Ambulatory Visit | Admitting: Cardiology

## 2013-11-08 ENCOUNTER — Emergency Department (HOSPITAL_COMMUNITY): Payer: Medicare Other

## 2013-11-08 ENCOUNTER — Inpatient Hospital Stay (HOSPITAL_COMMUNITY)
Admission: EM | Admit: 2013-11-08 | Discharge: 2013-11-16 | DRG: 291 | Disposition: A | Discharge status: Home / Self Care | Payer: Medicare Other | Attending: Cardiology | Admitting: Cardiology

## 2013-11-08 ENCOUNTER — Encounter (HOSPITAL_COMMUNITY): Payer: Self-pay | Admitting: Physician Assistant

## 2013-11-08 VITALS — BP 110/80 | HR 100 | Ht 65.5 in | Wt 250.1 lb

## 2013-11-08 DIAGNOSIS — R0989 Other specified symptoms and signs involving the circulatory and respiratory systems: Secondary | ICD-10-CM

## 2013-11-08 DIAGNOSIS — Z79899 Other long term (current) drug therapy: Secondary | ICD-10-CM

## 2013-11-08 DIAGNOSIS — I4891 Unspecified atrial fibrillation: Secondary | ICD-10-CM

## 2013-11-08 DIAGNOSIS — I509 Heart failure, unspecified: Secondary | ICD-10-CM

## 2013-11-08 DIAGNOSIS — I10 Essential (primary) hypertension: Secondary | ICD-10-CM | POA: Diagnosis present

## 2013-11-08 DIAGNOSIS — G8929 Other chronic pain: Secondary | ICD-10-CM | POA: Diagnosis present

## 2013-11-08 DIAGNOSIS — Z8249 Family history of ischemic heart disease and other diseases of the circulatory system: Secondary | ICD-10-CM | POA: Insufficient documentation

## 2013-11-08 DIAGNOSIS — E785 Hyperlipidemia, unspecified: Secondary | ICD-10-CM | POA: Diagnosis present

## 2013-11-08 DIAGNOSIS — Z923 Personal history of irradiation: Secondary | ICD-10-CM

## 2013-11-08 DIAGNOSIS — Z6841 Body Mass Index (BMI) 40.0 and over, adult: Secondary | ICD-10-CM

## 2013-11-08 DIAGNOSIS — Z853 Personal history of malignant neoplasm of breast: Secondary | ICD-10-CM

## 2013-11-08 DIAGNOSIS — J4489 Other specified chronic obstructive pulmonary disease: Secondary | ICD-10-CM | POA: Diagnosis present

## 2013-11-08 DIAGNOSIS — I428 Other cardiomyopathies: Secondary | ICD-10-CM | POA: Diagnosis present

## 2013-11-08 DIAGNOSIS — I2510 Atherosclerotic heart disease of native coronary artery without angina pectoris: Secondary | ICD-10-CM

## 2013-11-08 DIAGNOSIS — Z96659 Presence of unspecified artificial knee joint: Secondary | ICD-10-CM

## 2013-11-08 DIAGNOSIS — I5031 Acute diastolic (congestive) heart failure: Secondary | ICD-10-CM | POA: Insufficient documentation

## 2013-11-08 DIAGNOSIS — E559 Vitamin D deficiency, unspecified: Secondary | ICD-10-CM | POA: Diagnosis present

## 2013-11-08 DIAGNOSIS — Z833 Family history of diabetes mellitus: Secondary | ICD-10-CM

## 2013-11-08 DIAGNOSIS — Z9221 Personal history of antineoplastic chemotherapy: Secondary | ICD-10-CM

## 2013-11-08 DIAGNOSIS — J449 Chronic obstructive pulmonary disease, unspecified: Secondary | ICD-10-CM | POA: Diagnosis present

## 2013-11-08 DIAGNOSIS — R0600 Dyspnea, unspecified: Secondary | ICD-10-CM | POA: Diagnosis present

## 2013-11-08 DIAGNOSIS — I5021 Acute systolic (congestive) heart failure: Secondary | ICD-10-CM | POA: Diagnosis present

## 2013-11-08 DIAGNOSIS — I5023 Acute on chronic systolic (congestive) heart failure: Principal | ICD-10-CM | POA: Diagnosis present

## 2013-11-08 DIAGNOSIS — R0609 Other forms of dyspnea: Secondary | ICD-10-CM

## 2013-11-08 DIAGNOSIS — Z87891 Personal history of nicotine dependence: Secondary | ICD-10-CM

## 2013-11-08 DIAGNOSIS — I420 Dilated cardiomyopathy: Secondary | ICD-10-CM

## 2013-11-08 DIAGNOSIS — M549 Dorsalgia, unspecified: Secondary | ICD-10-CM | POA: Diagnosis present

## 2013-11-08 DIAGNOSIS — J9600 Acute respiratory failure, unspecified whether with hypoxia or hypercapnia: Secondary | ICD-10-CM | POA: Diagnosis present

## 2013-11-08 DIAGNOSIS — Z7901 Long term (current) use of anticoagulants: Secondary | ICD-10-CM

## 2013-11-08 DIAGNOSIS — Z885 Allergy status to narcotic agent status: Secondary | ICD-10-CM

## 2013-11-08 LAB — CBC WITH DIFFERENTIAL/PLATELET
Basophils Absolute: 0 10*3/uL (ref 0.0–0.1)
Basophils Relative: 0 % (ref 0–1)
Eosinophils Absolute: 0.1 10*3/uL (ref 0.0–0.7)
Eosinophils Relative: 1 % (ref 0–5)
HEMATOCRIT: 36.6 % (ref 36.0–46.0)
HEMOGLOBIN: 12.3 g/dL (ref 12.0–15.0)
Lymphocytes Relative: 9 % — ABNORMAL LOW (ref 12–46)
Lymphs Abs: 0.9 10*3/uL (ref 0.7–4.0)
MCH: 30.6 pg (ref 26.0–34.0)
MCHC: 33.6 g/dL (ref 30.0–36.0)
MCV: 91 fL (ref 78.0–100.0)
MONO ABS: 0.9 10*3/uL (ref 0.1–1.0)
MONOS PCT: 9 % (ref 3–12)
NEUTROS ABS: 8.2 10*3/uL — AB (ref 1.7–7.7)
Neutrophils Relative %: 82 % — ABNORMAL HIGH (ref 43–77)
Platelets: 322 10*3/uL (ref 150–400)
RBC: 4.02 MIL/uL (ref 3.87–5.11)
RDW: 14.7 % (ref 11.5–15.5)
WBC: 10 10*3/uL (ref 4.0–10.5)

## 2013-11-08 LAB — COMPREHENSIVE METABOLIC PANEL
ALT: 25 U/L (ref 0–35)
AST: 19 U/L (ref 0–37)
Albumin: 3.1 g/dL — ABNORMAL LOW (ref 3.5–5.2)
Alkaline Phosphatase: 88 U/L (ref 39–117)
BILIRUBIN TOTAL: 0.7 mg/dL (ref 0.3–1.2)
BUN: 18 mg/dL (ref 6–23)
CHLORIDE: 100 meq/L (ref 96–112)
CO2: 23 mEq/L (ref 19–32)
Calcium: 9.4 mg/dL (ref 8.4–10.5)
Creatinine, Ser: 0.7 mg/dL (ref 0.50–1.10)
GFR calc Af Amer: >90 mL/min (ref >90)
GFR, EST NON AFRICAN AMERICAN: 87 mL/min — AB (ref >90)
GLUCOSE: 179 mg/dL — AB (ref 70–99)
Potassium: 3 mEq/L — ABNORMAL LOW (ref 3.7–5.3)
Sodium: 139 mEq/L (ref 137–147)
Total Protein: 6.7 g/dL (ref 6.0–8.3)

## 2013-11-08 LAB — TROPONIN I: Troponin I: <0.3 ng/mL (ref <0.30)

## 2013-11-08 LAB — PROTIME-INR
INR: 1.08 (ref 0.00–1.49)
Prothrombin Time: 13.8 seconds (ref 11.6–15.2)

## 2013-11-08 LAB — PRO B NATRIURETIC PEPTIDE: Pro B Natriuretic peptide (BNP): 1460 pg/mL — ABNORMAL HIGH (ref 0–125)

## 2013-11-08 MED ORDER — SIMVASTATIN 40 MG PO TABS
40.0000 mg | ORAL_TABLET | Freq: Every day | ORAL | Status: DC
  Administered 2013-11-09: 40 mg via ORAL
  Filled 2013-11-08 (×3): qty 1

## 2013-11-08 MED ORDER — HEPARIN BOLUS VIA INFUSION
4000.0000 [IU] | Freq: Once | INTRAVENOUS | Status: AC
  Administered 2013-11-08: 4000 [IU] via INTRAVENOUS
  Filled 2013-11-08: qty 4000

## 2013-11-08 MED ORDER — VITAMIN D 1000 UNITS PO TABS
2000.0000 [IU] | ORAL_TABLET | Freq: Every day | ORAL | Status: DC
  Administered 2013-11-09 – 2013-11-16 (×8): 2000 [IU] via ORAL
  Filled 2013-11-08 (×8): qty 2

## 2013-11-08 MED ORDER — ALPRAZOLAM 0.25 MG PO TABS
0.2500 mg | ORAL_TABLET | Freq: Two times a day (BID) | ORAL | Status: DC | PRN
  Administered 2013-11-10: 0.25 mg via ORAL
  Filled 2013-11-08: qty 1

## 2013-11-08 MED ORDER — CALCIUM CARBONATE 1250 (500 CA) MG PO TABS
1000.0000 mg | ORAL_TABLET | Freq: Every day | ORAL | Status: DC
  Administered 2013-11-09 – 2013-11-16 (×9): 1000 mg via ORAL
  Filled 2013-11-08 (×10): qty 2

## 2013-11-08 MED ORDER — HYDROCHLOROTHIAZIDE 25 MG PO TABS
25.0000 mg | ORAL_TABLET | Freq: Every day | ORAL | Status: DC
  Administered 2013-11-09: 25 mg via ORAL
  Filled 2013-11-08: qty 1

## 2013-11-08 MED ORDER — DILTIAZEM HCL 100 MG IV SOLR
5.0000 mg/h | Freq: Once | INTRAVENOUS | Status: AC
  Administered 2013-11-08: 10 mg/h via INTRAVENOUS
  Filled 2013-11-08: qty 100

## 2013-11-08 MED ORDER — IRBESARTAN 150 MG PO TABS
150.0000 mg | ORAL_TABLET | Freq: Every day | ORAL | Status: DC
  Administered 2013-11-09 – 2013-11-13 (×4): 150 mg via ORAL
  Filled 2013-11-08 (×5): qty 1

## 2013-11-08 MED ORDER — CYCLOBENZAPRINE HCL 10 MG PO TABS
10.0000 mg | ORAL_TABLET | Freq: Three times a day (TID) | ORAL | Status: DC | PRN
  Filled 2013-11-08: qty 1

## 2013-11-08 MED ORDER — HEPARIN (PORCINE) IN NACL 100-0.45 UNIT/ML-% IJ SOLN
2000.0000 [IU]/h | INTRAMUSCULAR | Status: AC
  Administered 2013-11-08: 1200 [IU]/h via INTRAVENOUS
  Administered 2013-11-09: 1600 [IU]/h via INTRAVENOUS
  Administered 2013-11-09: 2000 [IU]/h via INTRAVENOUS
  Filled 2013-11-08 (×5): qty 250

## 2013-11-08 MED ORDER — FLUTICASONE PROPIONATE 50 MCG/ACT NA SUSP: 1.0000 | Freq: Every day | NASAL | Status: DC | PRN

## 2013-11-08 MED ORDER — SODIUM CHLORIDE 0.9 % IJ SOLN
3.0000 mL | Freq: Two times a day (BID) | INTRAMUSCULAR | Status: DC
  Administered 2013-11-10 – 2013-11-14 (×6): 3 mL via INTRAVENOUS

## 2013-11-08 MED ORDER — NITROGLYCERIN 0.4 MG SL SUBL: 0.4000 mg | SUBLINGUAL_TABLET | SUBLINGUAL | Status: DC | PRN

## 2013-11-08 MED ORDER — ZOLPIDEM TARTRATE 5 MG PO TABS
5.0000 mg | ORAL_TABLET | Freq: Every evening | ORAL | Status: DC | PRN
  Administered 2013-11-10 (×2): 5 mg via ORAL
  Filled 2013-11-08 (×2): qty 1

## 2013-11-08 MED ORDER — ADULT MULTIVITAMIN W/MINERALS CH
1.0000 | ORAL_TABLET | Freq: Every day | ORAL | Status: DC
  Administered 2013-11-09 – 2013-11-16 (×8): 1 via ORAL
  Filled 2013-11-08 (×8): qty 1

## 2013-11-08 MED ORDER — ACETAMINOPHEN 325 MG PO TABS: 650.0000 mg | ORAL_TABLET | ORAL | Status: DC | PRN

## 2013-11-08 MED ORDER — SODIUM CHLORIDE 0.9 % IJ SOLN
3.0000 mL | INTRAMUSCULAR | Status: DC | PRN
  Administered 2013-11-10 – 2013-11-13 (×2): 3 mL via INTRAVENOUS

## 2013-11-08 MED ORDER — DILTIAZEM HCL 100 MG IV SOLR
5.0000 mg/h | INTRAVENOUS | Status: DC
  Administered 2013-11-08 – 2013-11-10 (×6): 15 mg/h via INTRAVENOUS

## 2013-11-08 MED ORDER — VALSARTAN-HYDROCHLOROTHIAZIDE 160-25 MG PO TABS: 1.0000 | ORAL_TABLET | Freq: Every day | ORAL | Status: DC

## 2013-11-08 MED ORDER — SODIUM CHLORIDE 0.9 % IV SOLN: 250.0000 mL | INTRAVENOUS | Status: DC | PRN

## 2013-11-08 MED ORDER — CALCIUM CARBONATE 600 MG PO TABS
1200.0000 mg | ORAL_TABLET | Freq: Every day | ORAL | Status: DC
  Filled 2013-11-08: qty 2

## 2013-11-08 MED ORDER — ONDANSETRON HCL 4 MG/2ML IJ SOLN
4.0000 mg | Freq: Four times a day (QID) | INTRAMUSCULAR | Status: DC | PRN
Start: 2013-11-08 — End: 2013-11-16

## 2013-11-08 MED ORDER — HYDROCODONE-ACETAMINOPHEN 5-325 MG PO TABS: 1.0000 | ORAL_TABLET | Freq: Four times a day (QID) | ORAL | Status: DC | PRN

## 2013-11-08 NOTE — Progress Notes (Signed)
[address], [place], [place]  [address] Phone: [phone] Fax:  [phone]  Date:  11/08/2013   ID:  Cynthia, Herrera 1946-07-17, MRN [id number]  PCP:  Nyoka Cowden, MD  Cardiologist:  [name], MD     History of Present Illness: Cynthia Herrera is a 68 y.o. female with a history of dyslipidemia, HTN and family history of CAD in her father and brother who presents today for evaluation of CAD given her CRF and family history.  She was recently in the ER for dyspnea.  She apparently awakened from sleep at 4am with gurgling in her chest.  She got up to go to the bathroom and came back very SOB.  She was "panting for air".  Apparently she had undergone a steroid back injection 2 days prior.  A chest CT was done which showed cardiomegaly with interstitial edema and coronary artery calcifications.  Her BNP was elevated at the time.  She was given IV Lasix in the ED and sent home on a Zpack due to leukocytosis.   She denies having any chest pain.  She now presents for evaluation.  She says that she is still having problems with DOE.  She has trouble walking from the parking lot to the building.  She has noticed some LE edema as well.  She uses table salt at home.  She denies any palpitations, dizziness and no further PND.   Wt Readings from Last 3 Encounters:  11/08/13 250 lb 1.9 oz (113.454 kg)  10/23/13 254 lb (115.214 kg)  10/12/13 257 lb (116.574 kg)     Past Medical History  Diagnosis Date  . Personal history of colonic polyps     adenomas 03 and 08  . Hyperlipidemia   . Hypertension   . HX: breast cancer   . Osteopenia   . Vitamin D deficiency   . Allergy     seasonal  . Arthritis   . Breast cancer     left: surgery,chemo,radiation  . COPD (chronic obstructive pulmonary disease)     Current Outpatient Prescriptions  Medication Sig Dispense Refill  . calcium carbonate (OS-CAL) 600 MG TABS Take by mouth. 2 dialy       .  Cholecalciferol (VITAMIN D) 2000 UNITS tablet Take 2,000 Units by mouth daily.        . cyclobenzaprine (FLEXERIL) 10 MG tablet Take 1 tablet (10 mg total) by mouth 3 (three) times daily as needed for muscle spasms.  30 tablet  2  . fexofenadine-pseudoephedrine (ALLEGRA-D) 60-120 MG per tablet Take 1 tablet by mouth 2 (two) times daily.  90 tablet  4  . fluticasone (FLONASE) 50 MCG/ACT nasal spray USE 1 SPRAY PER NOSTRIL DAILY  16 g  2  . Hydrocodone-Acetaminophen 5-300 MG TABS TAKE 1 TABLET BY MOUTH EVERY 6 HOURS AS NEEDED  90 each  0  . ibuprofen (ADVIL,MOTRIN) 800 MG tablet TAKE 1 TABLET BY MOUTH EVERY 8 HOURS AS NEEDED FOR PAIN  90 tablet  0  . Multiple Vitamin (MULTIVITAMIN) tablet Take 1 tablet by mouth daily.        . simvastatin (ZOCOR) 40 MG tablet TAKE 1 TABLET BY MOUTH EVERY DAY  90 tablet  3  . valsartan-hydrochlorothiazide (DIOVAN-HCT) 160-25 MG per tablet TAKE 1 TABLET BY MOUTH DAILY.  90 tablet  2   No current facility-administered medications for this visit.    Allergies:    Allergies  Allergen Reactions  . Codeine Phosphate  Other (See Comments)    Weird feeling "    Social History:  The patient  reports that she quit smoking about 12 years ago. She has never used smokeless tobacco. She reports that she does not drink alcohol or use illicit drugs.   Family History:  The patient's family history includes Cancer in her mother; Dementia in her sister; Diabetes in her brother, father, mother, and sister; Heart disease in her brother and father; Liver cancer in her mother.   ROS:  Please see the history of present illness.      All other systems reviewed and negative.   PHYSICAL EXAM: VS:  BP 110/80  Pulse 100  Ht 5' 5.5" (1.664 m)  Wt 250 lb 1.9 oz (113.454 kg)  BMI 40.97 kg/m2 Well nourished, well developed, in no acute distress HEENT: normal Neck: no JVD Cardiac:  normal S1, S2;irregularly irregular and tachy; no murmur Lungs:  clear to auscultation bilaterally, no  wheezing, rhonchi or rales Abd: soft, nontender, no hepatomegaly Ext: no edema Skin: warm and dry Neuro:  CNs 2-12 intact, no focal abnormalities noted  EKG:     Atrial fibrillation with RVR at 159bpm with nonspecific ST abnormalityatri  ASSESSMENT AND PLAN:  1. Family history of ASCAD 2. HTN - well controlled  - continue Diovan HCT 3. Dyslipidemia  - continue Simvastatin 4. Coronary artery calcifications on chest CT with no chest pain  - she has not had any chest pain.    - I will get a stress myoview to rule out ischemia once her heart rate is controlled in afib. 5. Recent bout of acute CHF treated with IV Lasix in the ER - still having bouts of SOB.    - check proBNP 6. Atrial fibrillation with RVR new onset  - will admit to TCU for acute rate control with IV Cardizem gtt  - check TSH/CMET  - IV Heparin gtt 7. Cardiomegaly on chest CT  - check 2D echo to evaluate LV size and LVF and assess diastolic function once HR controlled  Followup with me 1 week after stress test done  Signed, [name], MD 11/08/2013 11:56 AM

## 2013-11-08 NOTE — ED Notes (Signed)
MD at bedside. 

## 2013-11-08 NOTE — Progress Notes (Signed)
ANTICOAGULATION CONSULT NOTE - Initial Consult  Pharmacy Consult for heparin Indication: atrial fibrillation  Allergies  Allergen Reactions  . Codeine Phosphate Other (See Comments)    Weird feeling "    Patient Measurements:    Dosing Weight: 76.5 kg  Vital Signs: Temp: 97.6 F (36.4 C) (01/15 1256) Temp src: Oral (01/15 1256) BP: 110/81 mmHg (01/15 1256) Pulse Rate: 142 (01/15 1256)  Labs: No results found for this basename: HGB, HCT, PLT, APTT, LABPROT, INR, HEPARINUNFRC, CREATININE, CKTOTAL, CKMB, TROPONINI,  in the last 72 hours  The CrCl is unknown because both a height and weight (above a minimum accepted value) are required for this calculation.   Medical History: Past Medical History  Diagnosis Date  . Personal history of colonic polyps     adenomas 03 and 08  . Hyperlipidemia   . Hypertension   . HX: breast cancer   . Osteopenia   . Vitamin D deficiency   . Allergy     seasonal  . Arthritis   . Breast cancer     left: surgery,chemo,radiation  . COPD (chronic obstructive pulmonary disease)     Medications:  See med rec  Assessment: 68 yo lady to start heparin for afib.  She was sent to the ER from MD's office.   Goal of Therapy:  Heparin level 0.3-0.7 units/ml Monitor platelets by anticoagulation protocol: Yes   Plan:  Heparin bolus 4000 units and drip at 1200 units/hr Check heparin level 6 hours after start. Heparin level and CBC daily while on heparin.  Bertie Simien, [place] 11/08/2013,1:44 PM

## 2013-11-08 NOTE — ED Provider Notes (Signed)
CSN: [id number]     Arrival date & time 11/08/13  1250 History   First MD Initiated Contact with Patient 11/08/13 1313     No chief complaint on file.  (Consider location/radiation/quality/duration/timing/severity/associated sxs/prior Treatment) The history is provided by the patient.   patient was sent in by her cardiologist, Dr. Radford Pax for new onset atrial fibrillation with RVR. She states she was recently admitted hospital with some CHF. She states she had an extensive workup at that time. She states she's continued to have difficulty breathing since then. She states with her cardiologist today and was found to be in A. fib with RVR. She does not have a history of this. No chest pain. She has had shortness of breath with some cough. No swelling or legs. No fevers. No nausea. No weight loss.  Past Medical History  Diagnosis Date  . Personal history of colonic polyps     adenomas 03 and 08  . Hyperlipidemia   . Hypertension   . Osteopenia   . Vitamin D deficiency   . Allergy     seasonal  . Arthritis   . Breast cancer     left: surgery,chemo,radiation  . COPD (chronic obstructive pulmonary disease)    Past Surgical History  Procedure Laterality Date  . Mastectomy  July 2002    left  . Cholecystectomy  2004  . Colonoscopy  2003, September 2008, April 01, 2011    adenomas 03 and 08, polyp 12, diverticulosis  . Joint replacement      totally replacement surgery  . Tubal ligation    . Breast lumpectomy  2002  . Port-a-cath removal    . Total knee arthroplasty  2006   Family History  Problem Relation Age of Onset  . Dementia Sister   . Diabetes Sister   . Diabetes Brother   . Heart disease Brother   . Liver cancer Mother   . Diabetes Mother   . Cancer Mother     pancreatic  . Diabetes Father   . Heart disease Father    History  Substance Use Topics  . Smoking status: Former Smoker    Quit date: 08/24/2001  . Smokeless tobacco: Never Used  . Alcohol Use: No   OB  History   Grav Para Term Preterm Abortions TAB SAB Ect Mult Living                 Review of Systems  Constitutional: Negative for activity change and appetite change.  Eyes: Negative for pain.  Respiratory: Positive for shortness of breath. Negative for chest tightness.   Cardiovascular: Positive for leg swelling. Negative for chest pain and palpitations.  Gastrointestinal: Negative for nausea, vomiting, abdominal pain and diarrhea.  Genitourinary: Negative for flank pain.  Musculoskeletal: Negative for back pain and neck stiffness.  Skin: Negative for rash.  Neurological: Negative for weakness, numbness and headaches.  Psychiatric/Behavioral: Negative for behavioral problems.    Allergies  Codeine phosphate  Home Medications   Current Outpatient Rx  Name  Route  Sig  Dispense  Refill  . calcium carbonate (OS-CAL) 600 MG TABS   Oral   Take 600 mg by mouth daily with breakfast. 2 dialy         . Cholecalciferol (VITAMIN D) 2000 UNITS tablet   Oral   Take 2,000 Units by mouth daily.           . cyclobenzaprine (FLEXERIL) 10 MG tablet   Oral   Take 1 tablet (10  mg total) by mouth 3 (three) times daily as needed for muscle spasms.   30 tablet   2   . fexofenadine-pseudoephedrine (ALLEGRA-D) 60-120 MG per tablet   Oral   Take 1 tablet by mouth 2 (two) times daily as needed (allergies).         . fluticasone (FLONASE) 50 MCG/ACT nasal spray   Each Nare   Place 1 spray into both nostrils daily as needed for allergies.         . Hydrocodone-Acetaminophen 5-300 MG TABS   Oral   Take 1 tablet by mouth every 6 (six) hours as needed (pain).         Marland Kitchen ibuprofen (ADVIL,MOTRIN) 800 MG tablet   Oral   Take 800 mg by mouth every 8 (eight) hours as needed for moderate pain.          . Multiple Vitamin (MULTIVITAMIN) tablet   Oral   Take 1 tablet by mouth daily.           . simvastatin (ZOCOR) 40 MG tablet   Oral   Take 40 mg by mouth daily.         .  valsartan-hydrochlorothiazide (DIOVAN-HCT) 160-25 MG per tablet   Oral   Take 1 tablet by mouth daily.          BP 105/66  Pulse 144  Temp(Src) 97.6 F (36.4 C) (Oral)  Resp 26  SpO2 97% Physical Exam  Nursing note and vitals reviewed. Constitutional: She is oriented to person, place, and time. She appears well-developed and well-nourished.  HENT:  Head: Normocephalic and atraumatic.  Eyes: EOM are normal. Pupils are equal, round, and reactive to light.  Neck: Normal range of motion. Neck supple. No thyromegaly present.  Cardiovascular: Normal heart sounds.   No murmur heard. Irregular tachycardia  Pulmonary/Chest: Effort normal and breath sounds normal. No respiratory distress. She has no wheezes. She has no rales.  Abdominal: Soft. Bowel sounds are normal. She exhibits no distension. There is no tenderness. There is no rebound and no guarding.  Musculoskeletal: Normal range of motion.  Neurological: She is alert and oriented to person, place, and time. No cranial nerve deficit.  Skin: Skin is warm and dry.  Psychiatric: She has a normal mood and affect. Her speech is normal.    ED Course  Procedures (including critical care time) Labs Review Labs Reviewed  CBC WITH DIFFERENTIAL - Abnormal; Notable for the following:    Neutrophils Relative % 82 (*)    Neutro Abs 8.2 (*)    Lymphocytes Relative 9 (*)    All other components within normal limits  COMPREHENSIVE METABOLIC PANEL - Abnormal; Notable for the following:    Potassium 3.0 (*)    Glucose, Bld 179 (*)    Albumin 3.1 (*)    GFR calc non Af Amer 87 (*)    All other components within normal limits  PRO B NATRIURETIC PEPTIDE - Abnormal; Notable for the following:    Pro B Natriuretic peptide (BNP) 1460.0 (*)    All other components within normal limits  TROPONIN I  TSH  HEPARIN LEVEL (UNFRACTIONATED)   Imaging Review Dg Chest Port 1 View  11/08/2013   CLINICAL DATA:  Shortness of breath, tachycardia  EXAM:  PORTABLE CHEST - 1 VIEW  COMPARISON:  10/12/2013  FINDINGS: Cardiomegaly evident with central vascular and interstitial prominence compatible with early edema. Basilar atelectasis noted. No large effusion or pneumothorax. Atherosclerosis of the aorta. Trachea is midline.  IMPRESSION: Cardiomegaly with mild central hilar developing edema.   Electronically Signed   By: Daryll Brod M.D.   On: 11/08/2013 14:12    EKG Interpretation    Date/Time:  Thursday November 08 2013 12:56:07 EST Ventricular Rate:  156 PR Interval:    QRS Duration: 86 QT Interval:  312 QTC Calculation: 503 R Axis:   84 Text Interpretation:  Atrial fibrillation with RVR Probable anteroseptal infarct, old Prolonged QT interval SVT is new Confirmed by Dravin Lance  MD, Alison Breeding ([id number]) on 11/08/2013 2:31:42 PM            MDM  No diagnosis found. Patient with RVR. Mild CHF on x-ray. Has been seen by cardiology in the office. Will limit to  cardiology. Started on Cardizem drip. Also started on heparin.   Jasper Riling. Alvino Chapel, MD 11/08/13 [phone]

## 2013-11-08 NOTE — Patient Instructions (Signed)
We are sending you over to the [facility]

## 2013-11-08 NOTE — H&P (Signed)
[address] [place]  [place], [place] [address]  Phone: [phone]  Fax: [phone]  Date: 11/08/2013  ID: Cynthia Herrera, Cynthia Herrera [birth year], MRN [id number]  PCP: Nyoka Cowden, MD  Cardiologist: [name], MD  History of Present Illness:  Cynthia Herrera is a 68 y.o. female with a history of dyslipidemia, HTN and family history of CAD in her father and brother who presents today for evaluation of CAD given her CRF and family history. She was recently in the ER for dyspnea. She apparently awakened from sleep at 4am with gurgling in her chest. She got up to go to the bathroom and came back very SOB. She was "panting for air". Apparently she had undergone a steroid back injection 2 days prior. A chest CT was done which showed cardiomegaly with interstitial edema and coronary artery calcifications. Her BNP was elevated at the time. She was given IV Lasix in the ED and sent home on a Zpack due to leukocytosis. She denies having any chest pain. She now presents for evaluation. She says that she is still having problems with DOE. She has trouble walking from the parking lot to the building. She has noticed some LE edema as well. She uses table salt at home. She denies any palpitations, dizziness and no further PND.  Wt Readings from Last 3 Encounters:   11/08/13  250 lb 1.9 oz (113.454 kg)   10/23/13  254 lb (115.214 kg)   10/12/13  257 lb (116.574 kg)    Past Medical History   Diagnosis  Date   .  Personal history of colonic polyps      adenomas 03 and 08   .  Hyperlipidemia    .  Hypertension    .  HX: breast cancer    .  Osteopenia    .  Vitamin D deficiency    .  Allergy      seasonal   .  Arthritis    .  Breast cancer      left: surgery,chemo,radiation   .  COPD (chronic obstructive pulmonary disease)     Current Outpatient Prescriptions   Medication  Sig  Dispense  Refill   .  calcium carbonate (OS-CAL) 600 MG TABS  Take by mouth. 2 dialy     .   Cholecalciferol (VITAMIN D) 2000 UNITS tablet  Take 2,000 Units by mouth daily.     .  cyclobenzaprine (FLEXERIL) 10 MG tablet  Take 1 tablet (10 mg total) by mouth 3 (three) times daily as needed for muscle spasms.  30 tablet  2   .  fexofenadine-pseudoephedrine (ALLEGRA-D) 60-120 MG per tablet  Take 1 tablet by mouth 2 (two) times daily.  90 tablet  4   .  fluticasone (FLONASE) 50 MCG/ACT nasal spray  USE 1 SPRAY PER NOSTRIL DAILY  16 g  2   .  Hydrocodone-Acetaminophen 5-300 MG TABS  TAKE 1 TABLET BY MOUTH EVERY 6 HOURS AS NEEDED  90 each  0   .  ibuprofen (ADVIL,MOTRIN) 800 MG tablet  TAKE 1 TABLET BY MOUTH EVERY 8 HOURS AS NEEDED FOR PAIN  90 tablet  0   .  Multiple Vitamin (MULTIVITAMIN) tablet  Take 1 tablet by mouth daily.     .  simvastatin (ZOCOR) 40 MG tablet  TAKE 1 TABLET BY MOUTH EVERY DAY  90 tablet  3   .  valsartan-hydrochlorothiazide (DIOVAN-HCT) 160-25 MG per tablet  TAKE 1 TABLET BY MOUTH  DAILY.  90 tablet  2    No current facility-administered medications for this visit.    Allergies:  Allergies   Allergen  Reactions   .  Codeine Phosphate  Other (See Comments)     Weird feeling "    Social History: The patient reports that she quit smoking about 12 years ago. She has never used smokeless tobacco. She reports that she does not drink alcohol or use illicit drugs.  Family History: The patient's family history includes Cancer in her mother; Dementia in her sister; Diabetes in her brother, father, mother, and sister; Heart disease in her brother and father; Liver cancer in her mother.  ROS: Please see the history of present illness. All other systems reviewed and negative.  PHYSICAL EXAM:  VS: BP 110/80  Pulse 100  Ht 5' 5.5" (1.664 m)  Wt 250 lb 1.9 oz (113.454 kg)  BMI 40.97 kg/m2  Well nourished, well developed, in no acute distress  HEENT: normal  Neck: no JVD  Cardiac: normal S1, S2;irregularly irregular and tachy; no murmur  Lungs: clear to auscultation  bilaterally, no wheezing, rhonchi or rales  Abd: soft, nontender, no hepatomegaly  Ext: no edema  Skin: warm and dry  Neuro: CNs 2-12 intact, no focal abnormalities noted  EKG: Atrial fibrillation with RVR at 159bpm with nonspecific ST abnormalityatri  ASSESSMENT AND PLAN:  1. Family history of ASCAD 2. HTN - well controlled   - continue Diovan HCT  3. Dyslipidemia   - continue Simvastatin  4. Coronary artery calcifications on chest CT with no chest pain   - she has not had any chest pain.    - I will get a stress myoview to rule out ischemia once her heart rate is controlled in afib.  5. Recent bout of acute CHF treated with IV Lasix in the ER - still having bouts of SOB.    - check proBNP  6. Atrial fibrillation with RVR new onset   - will admit to TCU for acute rate control with IV Cardizem gtt    - check TSH/CMET    - IV Heparin gtt  7. Cardiomegaly on chest CT   - check 2D echo to evaluate LV size and LVF and assess diastolic function once HR controlled   Signed,  [name], MD  11/08/2013 11:56 AM

## 2013-11-09 ENCOUNTER — Other Ambulatory Visit (HOSPITAL_COMMUNITY): Payer: Medicare Other

## 2013-11-09 ENCOUNTER — Encounter (HOSPITAL_COMMUNITY): Payer: Self-pay | Admitting: Neurology

## 2013-11-09 LAB — CBC
HCT: 37.7 % (ref 36.0–46.0)
HEMOGLOBIN: 12.7 g/dL (ref 12.0–15.0)
MCH: 30.7 pg (ref 26.0–34.0)
MCHC: 33.7 g/dL (ref 30.0–36.0)
MCV: 91.1 fL (ref 78.0–100.0)
PLATELETS: 344 10*3/uL (ref 150–400)
RBC: 4.14 MIL/uL (ref 3.87–5.11)
RDW: 14.6 % (ref 11.5–15.5)
WBC: 13.2 10*3/uL — ABNORMAL HIGH (ref 4.0–10.5)

## 2013-11-09 LAB — COMPREHENSIVE METABOLIC PANEL
ALBUMIN: 3.2 g/dL — AB (ref 3.5–5.2)
ALT: 31 U/L (ref 0–35)
AST: 25 U/L (ref 0–37)
Alkaline Phosphatase: 91 U/L (ref 39–117)
BUN: 19 mg/dL (ref 6–23)
CO2: 22 mEq/L (ref 19–32)
Calcium: 9.2 mg/dL (ref 8.4–10.5)
Chloride: 102 mEq/L (ref 96–112)
Creatinine, Ser: 0.64 mg/dL (ref 0.50–1.10)
GFR calc non Af Amer: 90 mL/min — ABNORMAL LOW (ref >90)
GLUCOSE: 136 mg/dL — AB (ref 70–99)
Potassium: 3 mEq/L — ABNORMAL LOW (ref 3.7–5.3)
SODIUM: 141 meq/L (ref 137–147)
TOTAL PROTEIN: 6.8 g/dL (ref 6.0–8.3)
Total Bilirubin: 0.6 mg/dL (ref 0.3–1.2)

## 2013-11-09 LAB — TROPONIN I: Troponin I: <0.3 ng/mL (ref <0.30)

## 2013-11-09 LAB — HEPARIN LEVEL (UNFRACTIONATED)
Heparin Unfractionated: <0.1 IU/mL — ABNORMAL LOW (ref 0.30–0.70)
Heparin Unfractionated: <0.1 IU/mL — ABNORMAL LOW (ref 0.30–0.70)

## 2013-11-09 LAB — HEMOGLOBIN A1C
Hgb A1c MFr Bld: 6.5 % — ABNORMAL HIGH (ref <5.7)
Mean Plasma Glucose: 140 mg/dL — ABNORMAL HIGH (ref <117)

## 2013-11-09 LAB — TSH: TSH: 0.562 u[IU]/mL (ref 0.350–4.500)

## 2013-11-09 LAB — MRSA PCR SCREENING: MRSA by PCR: NEGATIVE

## 2013-11-09 MED ORDER — INSULIN ASPART 100 UNIT/ML ~~LOC~~ SOLN: 0.0000 [IU] | Freq: Three times a day (TID) | SUBCUTANEOUS | Status: DC

## 2013-11-09 MED ORDER — FUROSEMIDE 10 MG/ML IJ SOLN
40.0000 mg | Freq: Two times a day (BID) | INTRAMUSCULAR | Status: DC
  Administered 2013-11-09 – 2013-11-10 (×3): 40 mg via INTRAVENOUS
  Filled 2013-11-09 (×3): qty 4

## 2013-11-09 MED ORDER — HEPARIN BOLUS VIA INFUSION
3000.0000 [IU] | Freq: Once | INTRAVENOUS | Status: AC
  Administered 2013-11-09: 3000 [IU] via INTRAVENOUS
  Filled 2013-11-09: qty 3000

## 2013-11-09 MED ORDER — ATORVASTATIN CALCIUM 20 MG PO TABS
20.0000 mg | ORAL_TABLET | Freq: Every day | ORAL | Status: DC
  Administered 2013-11-09 – 2013-11-15 (×7): 20 mg via ORAL
  Filled 2013-11-09 (×8): qty 1

## 2013-11-09 MED ORDER — CARVEDILOL 6.25 MG PO TABS
6.2500 mg | ORAL_TABLET | Freq: Two times a day (BID) | ORAL | Status: DC
  Administered 2013-11-09 – 2013-11-13 (×9): 6.25 mg via ORAL
  Filled 2013-11-09 (×12): qty 1

## 2013-11-09 MED ORDER — FUROSEMIDE 10 MG/ML IJ SOLN
INTRAMUSCULAR | Status: AC
  Filled 2013-11-09: qty 4

## 2013-11-09 MED ORDER — RIVAROXABAN 20 MG PO TABS
20.0000 mg | ORAL_TABLET | Freq: Every day | ORAL | Status: DC
  Administered 2013-11-09 – 2013-11-15 (×7): 20 mg via ORAL
  Filled 2013-11-09 (×8): qty 1

## 2013-11-09 MED ORDER — POTASSIUM CHLORIDE CRYS ER 20 MEQ PO TBCR
40.0000 meq | EXTENDED_RELEASE_TABLET | Freq: Three times a day (TID) | ORAL | Status: AC
  Administered 2013-11-09 (×3): 40 meq via ORAL
  Filled 2013-11-09 (×3): qty 2

## 2013-11-09 MED ORDER — SODIUM CHLORIDE 0.9 % IV SOLN
INTRAVENOUS | Status: DC
  Administered 2013-11-09: 22:00:00 via INTRAVENOUS

## 2013-11-09 NOTE — Progress Notes (Signed)
ANTICOAGULATION CONSULT NOTE - Follow up [place] for heparin transition to Xarelto Indication: atrial fibrillation  Allergies  Allergen Reactions  . Codeine Phosphate Other (See Comments)    Weird feeling "    Patient Measurements: Height: 5\' 6"  (167.6 cm) Weight: 248 lb 10.9 oz (112.8 kg) IBW/kg (Calculated) : 59.3  Dosing Weight: 76.5 kg  Vital Signs: Temp: 97.8 F (36.6 C) (01/16 1200) Temp src: Oral (01/16 1200) BP: 105/85 mmHg (01/16 1200) Pulse Rate: 108 (01/16 1200)  Labs:  Recent Labs  11/08/13 1335  11/08/13 1337 11/08/13 2248 11/08/13 2355 11/09/13 0308 11/09/13 0525 11/09/13 0644 11/09/13 1113  HGB 12.3  --   --   --   --  12.7  --   --   --   HCT 36.6  --   --   --   --  37.7  --   --   --   PLT 322  --   --   --   --  344  --   --   --   LABPROT  --   --   --  13.8  --   --   --   --   --   INR  --   --   --  1.08  --   --   --   --   --   HEPARINUNFRC  --   --   --  <0.10*  --   --   --  <0.10*  --   CREATININE 0.70  --   --   --   --  0.64  --   --   --   TROPONINI  --   < > <0.30  --  <0.30  --  <0.30  --  <0.30  < > = values in this interval not displayed.  Estimated Creatinine Clearance: 85.7 ml/min (by C-G formula based on Cr of 0.64).   Medications:  See med rec  Assessment: 68 yo lady to transition from heparin to Xarelto for afib. Heparin currently running at 2000 units/hr. H/H 12.7/37.7. Plt 344. No bleeding noted.     Goal of Therapy:  Monitor platelets by anticoagulation protocol: Yes    Plan:  Stop heparin infusion at 1700 tonight Give first dose of Xarelto 20 mg daily at 1700 after heparin infusion is stopped F/u CBC and s/s of bleeding.   Albertina Parr, PharmD.  Clinical Pharmacist Pager [phone]

## 2013-11-09 NOTE — Progress Notes (Signed)
ANTICOAGULATION CONSULT NOTE - Follow Up Consult  Pharmacy Consult for heparin Indication: atrial fibrillation  Labs:  Recent Labs  11/08/13 1335 11/08/13 1337 11/08/13 2248 11/08/13 2355 11/09/13 0308 11/09/13 0525 11/09/13 0644  HGB 12.3  --   --   --  12.7  --   --   HCT 36.6  --   --   --  37.7  --   --   PLT 322  --   --   --  344  --   --   LABPROT  --   --  13.8  --   --   --   --   INR  --   --  1.08  --   --   --   --   HEPARINUNFRC  --   --  <0.10*  --   --   --  <0.10*  CREATININE 0.70  --   --   --  0.64  --   --   TROPONINI  --  <0.30  --  <0.30  --  <0.30  --     Assessment: 68yo female remains undetectable on heparin despite bolus and rate increase.  Goal of Therapy:  Heparin level 0.3-0.7 units/ml   Plan:  Will rebolus with heparin 3000 units x1 and increase gtt by 4 units/kg/hr to 2000 units/hr and check level in [place], PharmD, BCPS  11/09/2013,7:47 AM

## 2013-11-09 NOTE — Progress Notes (Signed)
ANTICOAGULATION CONSULT NOTE - Follow Up Consult  Pharmacy Consult for heparin Indication: atrial fibrillation  Labs:  Recent Labs  11/08/13 1335 11/08/13 1337 11/08/13 2248  HGB 12.3  --   --   HCT 36.6  --   --   PLT 322  --   --   LABPROT  --   --  13.8  INR  --   --  1.08  HEPARINUNFRC  --   --  <0.10*  CREATININE 0.70  --   --   TROPONINI  --  <0.30  --     Assessment: 68yo female undetectable on heparin with initial dosing for new Afib.  Goal of Therapy:  Heparin level 0.3-0.7 units/ml   Plan:  Will rebolus with heparin 3000 units x1 and increase gtt by 4 units/kg/hr and check level in 6hr.  Wynona Neat, PharmD, BCPS  11/09/2013,12:50 AM

## 2013-11-09 NOTE — Discharge Instructions (Addendum)
Information on my medicine - XARELTO (Rivaroxaban)  This medication education was reviewed with me or my healthcare representative as part of my discharge preparation.  The pharmacist that spoke with me during my hospital stay was:  Lavenia Atlas, [facility]  Why was Xarelto prescribed for you? Xarelto was prescribed for you to reduce the risk of a blood clots forming after orthopedic surgery OR to reduce the risk of forming blood clots that cause a stroke if you have a medical condition called atrial fibrillation (a type of irregular heartbeat).  What do you need to know about xarelto ? Take your Xarelto ONCE DAILY at the same time every day with your evening meal. If you have difficulty swallowing the tablet whole, you may crush it and mix in applesauce just prior to taking your dose.  Take Xarelto exactly as prescribed by your doctor and DO NOT stop taking Xarelto without talking to the doctor who prescribed the medication.  Stopping without other stroke or VTE prevention medication to take the place of Xarelto may increase your risk of developing a new clot or stroke.  Refill your prescription before you run out.  After discharge, you should have regular check-up appointments with your healthcare provider that is prescribing your Xarelto.  In the future your dose may need to be changed if your kidney function or weight changes by a significant amount.  What do you do if you miss a dose? If you are taking Xarelto ONCE DAILY and you miss a dose, take it as soon as you remember on the same day then continue your regularly scheduled once daily regimen the next day. Do not take two doses of Xarelto at the same time.   Important Safety Information A possible side effect of Xarelto is bleeding. You should call your healthcare provider right away if you experience any of the following:   Bleeding from an injury or your nose that does not stop.   Unusual colored urine (red or dark brown)  or unusual colored stools (red or black).   Unusual bruising for unknown reasons.   A serious fall or if you hit your head (even if there is no bleeding).  Some medicines may interact with Xarelto and might increase your risk of bleeding while on Xarelto. To help avoid this, consult your healthcare provider or pharmacist prior to using any new prescription or non-prescription medications, including herbals, vitamins, non-steroidal anti-inflammatory drugs (NSAIDs) and supplements.  This website has more information on Xarelto: [email or web address].[email or web address]  Heart Failure Heart failure means your heart has trouble pumping blood. This makes it hard for your body to work well. Heart failure is usually a long-term (chronic) condition. You must take good care of yourself and follow your doctor's treatment plan. HOME CARE  Take your heart medicine as told by your doctor.  Do not stop taking medicine unless your doctor tells you to.  Do not skip any dose of medicine.  Refill your medicines before they run out.  Take other medicines only as told by your doctor or pharmacist.  Stay active if told by your doctor. The elderly and people with severe heart failure should talk with a doctor about physical activity.  Eat heart healthy foods. Choose foods that are without trans fat and are low in saturated fat, cholesterol, and salt (sodium). This includes fresh or frozen fruits and vegetables, fish, lean meats, fat-free or low-fat dairy foods, whole grains, and high-fiber foods. Lentils and dried peas and beans (legumes) are also  good choices.  Limit salt if told by your doctor.  Cook in a healthy way. Roast, grill, broil, bake, poach, steam, or stir-fry foods.  Limit fluids as told by your doctor.  Weigh yourself every morning. Do this after you pee (urinate) and before you eat breakfast. Write down your weight to give to your doctor.  Take your blood pressure and write it down if your doctor tell you  to.  Ask your doctor how to check your pulse. Check your pulse as told.  Lose weight if told by your doctor.  Stop smoking or chewing tobacco. Do not use gum or patches that help you quit without your doctor's approval.  Schedule and go to doctor visits as told.  Nonpregnant women should have no more than 1 drink a day. Men should have no more than 2 drinks a day. Talk to your doctor about drinking alcohol.  Stop illegal drug use.  Stay current with shots (immunizations).  Manage your health conditions as told by your doctor.  Learn to manage your stress.  Rest when you are tired.  If it is really hot outside:  Avoid intense activities.  Use air conditioning or fans, or get in a cooler place.  Avoid caffeine and alcohol.  Wear loose-fitting, lightweight, and light-colored clothing.  If it is really cold outside:  Avoid intense activities.  Layer your clothing.  Wear mittens or gloves, a hat, and a scarf when going outside.  Avoid alcohol.  Learn about heart failure and get support as needed.  Get help to maintain or improve your quality of life and your ability to care for yourself as needed. GET HELP IF:   You gain 03 lb/1.4 kg or more in 1 day or 05 lb/2.3 kg in a week.  You are more short of breath than usual.  You cannot do your normal activities.  You tire easily.  You cough more than normal, especially with activity.  You have any or more puffiness (swelling) in areas such as your hands, feet, ankles, or belly (abdomen).  You cannot sleep because it is hard to breathe.  You feel like your heart is beating fast (palpitations).  You get dizzy or lightheaded when you stand up. GET HELP RIGHT AWAY IF:   You have trouble breathing.  There is a change in mental status, such as becoming less alert or not being able to focus.  You have chest pain or discomfort.  You faint. MAKE SURE YOU:   Understand these instructions.  Will watch your  condition.  Will get help right away if you are not doing well or get worse. Document Released: 07/20/2008 Document Revised: 02/05/2013 Document Reviewed: 05/11/2012 [facility] Patient Information 2014 [place], [place]. Atrial Fibrillation Atrial fibrillation is a condition that causes your heart to beat irregularly. It may also cause your heart to beat faster than normal. Atrial fibrillation can prevent your heart from pumping blood normally. It increases your risk of stroke and heart problems. HOME CARE  Take medications as told by your doctor.  Only take medications that your doctor says are safe. Some medications can make the condition worse or happen again.  If blood thinners were prescribed by your doctor, take them exactly as told. Too much can cause bleeding. Too little and you will not have the needed protection against stroke and other problems.  Perform blood tests at home if told by your doctor.  Perform blood tests exactly as told by your doctor.  Do not drink  alcohol.  Do not drink beverages with caffeine such as coffee, soda, and some teas.  Maintain a healthy weight.  Do not use diet pills unless your doctor says they are safe. They may make heart problems worse.  Follow diet instructions as told by your doctor.  Exercise regularly as told by your doctor.  Keep all follow-up appointments. GET HELP RIGHT AWAY IF:   You have chest or belly (abdominal) pain.  You feel sick to your stomach (nauseous)  You suddenly have swollen feet and ankles.  You feel dizzy.  You face, arms, or legs feel numb or weak.  There is a change in your vision or speech.  You notice a change in the speed, rhythm, or strength of your heartbeat.  You suddenly begin peeing (urinating) more often.  You get tired more easily when moving or exercising. MAKE SURE YOU:   Understand these instructions.  Will watch your condition.  Will get help right away if you are not doing well or  get worse. Document Released: 07/20/2008 Document Revised: 02/05/2013 Document Reviewed: 11/21/2012 [employer] Patient Information 2014 [place], [place]. 2 Gram Low Sodium Diet A 2 gram sodium diet restricts the amount of sodium in the diet to no more than 2 g or 2000 mg daily. Limiting the amount of sodium is often used to help lower blood pressure. It is important if you have heart, liver, or kidney problems. Many foods contain sodium for flavor and sometimes as a preservative. When the amount of sodium in a diet needs to be low, it is important to know what to look for when choosing foods and drinks. The following includes some information and guidelines to help make it easier for you to adapt to a low sodium diet. QUICK TIPS  Do not add salt to food.  Avoid convenience items and fast food.  Choose unsalted snack foods.  Buy lower sodium products, often labeled as "lower sodium" or "no salt added."  Check food labels to learn how much sodium is in 1 serving.  When eating at a restaurant, ask that your food be prepared with less salt or none, if possible. READING FOOD LABELS FOR SODIUM INFORMATION The nutrition facts label is a good place to find how much sodium is in foods. Look for products with no more than 500 to 600 mg of sodium per meal and no more than 150 mg per serving. Remember that 2 g = 2000 mg. The food label may also list foods as:  Sodium-free: Less than 5 mg in a serving.  Very low sodium: 35 mg or less in a serving.  Low-sodium: 140 mg or less in a serving.  Light in sodium: 50% less sodium in a serving. For example, if a food that usually has 300 mg of sodium is changed to become light in sodium, it will have 150 mg of sodium.  Reduced sodium: 25% less sodium in a serving. For example, if a food that usually has 400 mg of sodium is changed to reduced sodium, it will have 300 mg of sodium. CHOOSING FOODS Grains  Avoid: Salted crackers and snack items. Some cereals,  including instant hot cereals. Bread stuffing and biscuit mixes. Seasoned rice or pasta mixes.  Choose: Unsalted snack items. Low-sodium cereals, oats, puffed wheat and rice, shredded wheat. English muffins and bread. Pasta. Meats  Avoid: Salted, canned, smoked, spiced, pickled meats, including fish and poultry. Bacon, ham, sausage, cold cuts, hot dogs, anchovies.  Choose: Low-sodium canned tuna and salmon.  Fresh or frozen meat, poultry, and fish. Dairy  Avoid: Processed cheese and spreads. Cottage cheese. Buttermilk and condensed milk. Regular cheese.  Choose: Milk. Low-sodium cottage cheese. Yogurt. Sour cream. Low-sodium cheese. Fruits and Vegetables  Avoid: Regular canned vegetables. Regular canned tomato sauce and paste. Frozen vegetables in sauces. Olives. Angie Fava. Relishes. Sauerkraut.  Choose: Low-sodium canned vegetables. Low-sodium tomato sauce and paste. Frozen or fresh vegetables. Fresh and frozen fruit. Condiments  Avoid: Canned and packaged gravies. Worcestershire sauce. Tartar sauce. Barbecue sauce. Soy sauce. Steak sauce. Ketchup. Onion, garlic, and table salt. Meat flavorings and tenderizers.  Choose: Fresh and dried herbs and spices. Low-sodium varieties of mustard and ketchup. Lemon juice. Tabasco sauce. Horseradish. SAMPLE 2 GRAM SODIUM MEAL PLAN Breakfast / Sodium (mg)  1 cup low-fat milk / [id number] mg  2 slices whole-wheat toast / 270 mg  1 tbs heart-healthy margarine / 153 mg  1 hard-boiled egg / 139 mg  1 small orange / 0 mg Lunch / Sodium (mg)  1 cup raw carrots / 76 mg   cup hummus / 298 mg  1 cup low-fat milk / 143 mg   cup red grapes / 2 mg  1 whole-wheat pita bread / 356 mg Dinner / Sodium (mg)  1 cup whole-wheat pasta / 2 mg  1 cup low-sodium tomato sauce / 73 mg  3 oz lean ground beef / 57 mg  1 small side salad (1 cup raw spinach leaves,  cup cucumber,  cup yellow bell pepper) with 1 tsp olive oil and 1 tsp red wine vinegar / 25  mg Snack / Sodium (mg)  1 container low-fat vanilla yogurt / 107 mg  3 graham cracker squares / 127 mg Nutrient Analysis  Calories: 2033  Protein: 77 g  Carbohydrate: 282 g  Fat: 72 g  Sodium: 1971 mg Document Released: 10/11/2005 Document Revised: 01/03/2012 Document Reviewed: 01/12/2010 [employer] Patient Information 2014 [place].

## 2013-11-09 NOTE — Care Management Note (Signed)
    Page 1 of 1   11/09/2013     8:01:53 AM   CARE MANAGEMENT NOTE 11/09/2013  Patient:  Cynthia Herrera, Cynthia Herrera   Account Number:  [id number]  Date Initiated:  11/09/2013  Documentation initiated by:  Elissa Hefty  Subjective/Objective Assessment:   adm w at fib     Action/Plan:   lives alone, pcp dr Cordelia Pen   Anticipated DC Date:     Anticipated DC Plan:           Choice offered to / List presented to:             Status of service:   Medicare Important Message given?   (If response is "NO", the following Medicare IM given date fields will be blank) Date Medicare IM given:   Date Additional Medicare IM given:    Discharge Disposition:    Per UR Regulation:  Reviewed for med. necessity/level of care/duration of stay  If discussed at [place] of Stay Meetings, dates discussed:    Comments:

## 2013-11-09 NOTE — Progress Notes (Signed)
Patient ID: Cynthia Herrera, female   DOB: June 16, 1946, 67 y.o.   MRN: [id number]    Subjective:  Significant dyspnea even while sitting in bed talking   Objective:  Filed Vitals:   11/09/13 0500 11/09/13 0600 11/09/13 0700 11/09/13 0800  BP: 108/74 113/59 112/69 112/65  Pulse: 124 125 128 123  Temp:    98.3 F (36.8 C)  TempSrc:    Oral  Resp: 34 34 29 36  Height:      Weight:      SpO2: 92% 92% 93% 93%    Intake/Output from previous day:  Intake/Output Summary (Last 24 hours) at 11/09/13 0911 Last data filed at 11/09/13 0700  Gross per 24 hour  Intake 490.68 ml  Output    275 ml  Net 215.68 ml    Physical Exam: Affect appropriate Obese tachypnic female  HEENT: normal Neck supple with no adenopathy JVP normal no bruits no thyromegaly Lungs clear with no wheezing and good diaphragmatic motion Heart:  S1/S2 no murmur, no rub, gallop or click PMI normal Abdomen: benighn, BS positve, no tenderness, no AAA no bruit.  No HSM or HJR Distal pulses intact with no bruits Plus one bilateral edema Neuro non-focal Skin warm and dry No muscular weakness   Lab Results: Basic Metabolic Panel:  Recent Labs  11/08/13 1335 11/09/13 0308  NA 139 141  K 3.0* 3.0*  CL 100 102  CO2 23 22  GLUCOSE 179* 136*  BUN 18 19  CREATININE 0.70 0.64  CALCIUM 9.4 9.2   Liver Function Tests:  Recent Labs  11/08/13 1335 11/09/13 0308  AST 19 25  ALT 25 31  ALKPHOS 88 91  BILITOT 0.7 0.6  PROT 6.7 6.8  ALBUMIN 3.1* 3.2*   CBC:  Recent Labs  11/08/13 1335 11/09/13 0308  WBC 10.0 13.2*  NEUTROABS 8.2*  --   HGB 12.3 12.7  HCT 36.6 37.7  MCV 91.0 91.1  PLT 322 344   Cardiac Enzymes:  Recent Labs  11/08/13 1337 11/08/13 2355 11/09/13 0525  TROPONINI <0.30 <0.30 <0.30   Thyroid Function Tests:  Recent Labs  11/08/13 2103  TSH 0.562    Imaging: Dg Chest Port 1 View  11/08/2013   CLINICAL DATA:  Shortness of breath, tachycardia  EXAM: PORTABLE CHEST  - 1 VIEW  COMPARISON:  10/12/2013  FINDINGS: Cardiomegaly evident with central vascular and interstitial prominence compatible with early edema. Basilar atelectasis noted. No large effusion or pneumothorax. Atherosclerosis of the aorta. Trachea is midline.  IMPRESSION: Cardiomegaly with mild central hilar developing edema.   Electronically Signed   By: Daryll Brod M.D.   On: 11/08/2013 14:12    Cardiac Studies:  ECG:  afib nonspecific ST/T wave changes    Telemetry: AFib rates still over 100  Echo:   Medications:   . calcium carbonate  1,000 mg of elemental calcium Oral Q breakfast  . cholecalciferol  2,000 Units Oral Daily  . irbesartan  150 mg Oral Daily   And  . hydrochlorothiazide  25 mg Oral Daily  . multivitamin with minerals  1 tablet Oral Daily  . simvastatin  40 mg Oral QHS  . sodium chloride  3 mL Intravenous Q12H     . diltiazem (CARDIZEM) infusion 15 mg/hr (11/09/13 0525)  . heparin 2,000 Units/hr (11/09/13 0817)    Assessment/Plan:  Afib:  Needs better rate control  Continue iv cardizem and add PO coreg  Start Xarelto  Echo today Discussed TEE/DCC with patient  Will plan for Monday  CHF:  D/C HCTZ  Start lasix 40 mg iv bid suppl K  Echo today CXR and BNP consistant with CHF Need to restore AV synchrony and lower rate Chol:  Continue statin  HTN:  Continue ARB   Jenkins Rouge 11/09/2013, 9:11 AM

## 2013-11-09 NOTE — Progress Notes (Signed)
Clinical Social Work Department BRIEF PSYCHOSOCIAL ASSESSMENT 11/09/2013  Patient:  Cynthia Herrera, Cynthia Herrera     Account Number:  [id number]     [place] date:  11/08/2013  Clinical Social Worker:  Freeman Caldron  Date/Time:  11/09/2013 11:27 AM  Referred by:  Physician  Date Referred:  11/09/2013 Referred for  Advanced Directives   Other Referral:   Interview type:  Patient Other interview type:    PSYCHOSOCIAL DATA Living Status:  ALONE Admitted from facility:   Level of care:   Primary support name:  Celisa Schoenberg [phone]) Primary support relationship to patient:  CHILD, ADULT Degree of support available:   Good--pt states her daughter and son provide support to her and that she has 4 grandchildren who have called while she has been in the hospital and they also provide support.    CURRENT CONCERNS Current Concerns  Other - See comment   Other Concerns:    SOCIAL WORK ASSESSMENT / PLAN CSW went to pt's bedside explaining that CSW was consulted because pt might be interested in filling out HCPOA paperwork and/or advanced directives. Pt confirmed that she did want this, and spoke at length with pt about her husband's experience when he had a stroke and was admitted to the hospital. Her husband died two days later. Pt states her husband did not have advanced directives and she realized the importance of this document. Additionally, her mother-in-law died and she did not want extreme measures to be taken for her mother-in-law and she does not want these for herself either. Pt explained that 2013 was a very difficult year for her, that she experienced many family losses (husband, sister, brother in 2013, and mother-in-law in March 2014). Pt states her dog also died about a year ago and that this was really heartbreaking for her as well. CSW provided support and talked at length with pt about how pets are members of our family and how devastating it can be to lose a beloved family  pet. Pt showed CSW pictures of her dogs (corgis) and explained that she adopted a [place] named [place] after her dog Percell Miller died. After filling out advanced directives paperwork, CSW got two witnesses who are of no relation to the pt and had not met the pt before, and had them sign the documents in the presence of a notary. Notary, Kendell Bane (CSW), provided pt with 4 copies of her advanced directives and explained that pt should place copies in an easily-accessible place for her children to find and that the hospital will keep a copy on her chart. Pt thanked witnesses and CSWs for assistance in filling document out. CSW explained that she will continue to provide support to pt and visit her while she is on CSW unit--CSW does not work weekends but pt states she looks forward to a visit on Monday.   Assessment/plan status:  Psychosocial Support/Ongoing Assessment of Needs Other assessment/ plan:   Information/referral to community resources:   Advanced directives filled out and notarized.    PATIENT'S/FAMILY'S RESPONSE TO PLAN OF CARE: Good--pt very friendly and appreciative of CSW support. Pt became tearful when talking about her deceased relatives and dog. CSW provided brief grief counseling and supportive counseling. Pt was grateful for CSW assistance in filling out advanced directives, and chose not to assign a HCPOA stating she would prefer and son and daughter make a joint decision concerning her health if she is unable to make decisions.       Ky Barban, MSW, Latanya Presser  Clinical Social Worker [phone] 

## 2013-11-09 NOTE — Clinical Documentation Improvement (Signed)
Possible Clinical Conditions?  Acute Respiratory Failure Acute Respiratory Distress Other Condition Cannot Clinically Determine   Supporting Information: Risk Factors: CHF (plus one bilateral edema); afib w rvr Signs & Symptoms:dyspnea, per progress note: significant dyspnea even while sitting in bed talking Diagnostics:pulse ox: 86% on 3 l/m Cynthia Herrera; RR: 34-36; Cardiomegaly with mild central hilar developing edema. Treatment:oxygen at 3 l.m [place]; lasix 40 mg iv bid  Thank You, Joya Salm ,RN Clinical Documentation Specialist:  [phone]  [facility]

## 2013-11-10 ENCOUNTER — Inpatient Hospital Stay (HOSPITAL_COMMUNITY): Payer: Medicare Other

## 2013-11-10 DIAGNOSIS — I059 Rheumatic mitral valve disease, unspecified: Secondary | ICD-10-CM

## 2013-11-10 DIAGNOSIS — J9600 Acute respiratory failure, unspecified whether with hypoxia or hypercapnia: Secondary | ICD-10-CM

## 2013-11-10 LAB — CBC
HCT: 34.8 % — ABNORMAL LOW (ref 36.0–46.0)
Hemoglobin: 11.8 g/dL — ABNORMAL LOW (ref 12.0–15.0)
MCH: 30.3 pg (ref 26.0–34.0)
MCHC: 33.9 g/dL (ref 30.0–36.0)
MCV: 89.5 fL (ref 78.0–100.0)
Platelets: 371 10*3/uL (ref 150–400)
RBC: 3.89 MIL/uL (ref 3.87–5.11)
RDW: 14.7 % (ref 11.5–15.5)
WBC: 12.9 10*3/uL — AB (ref 4.0–10.5)

## 2013-11-10 LAB — POCT I-STAT 3, ART BLOOD GAS (G3+)
ACID-BASE DEFICIT: 2 mmol/L (ref 0.0–2.0)
BICARBONATE: 20.3 meq/L (ref 20.0–24.0)
O2 SAT: 91 %
TCO2: 21 mmol/L (ref 0–100)
pCO2 arterial: 27.8 mmHg — ABNORMAL LOW (ref 35.0–45.0)
pH, Arterial: 7.473 — ABNORMAL HIGH (ref 7.350–7.450)
pO2, Arterial: 56 mmHg — ABNORMAL LOW (ref 80.0–100.0)

## 2013-11-10 MED ORDER — FUROSEMIDE 10 MG/ML IJ SOLN
80.0000 mg | Freq: Two times a day (BID) | INTRAMUSCULAR | Status: DC
  Administered 2013-11-10: 40 mg via INTRAVENOUS
  Administered 2013-11-11: 80 mg via INTRAVENOUS
  Filled 2013-11-10 (×2): qty 8

## 2013-11-10 MED ORDER — LEVALBUTEROL HCL 0.63 MG/3ML IN NEBU: 0.6300 mg | INHALATION_SOLUTION | Freq: Four times a day (QID) | RESPIRATORY_TRACT | Status: DC | PRN

## 2013-11-10 MED ORDER — NITROGLYCERIN IN D5W 200-5 MCG/ML-% IV SOLN
5.0000 ug/min | INTRAVENOUS | Status: DC
  Administered 2013-11-10: 5 ug/min via INTRAVENOUS
  Filled 2013-11-10: qty 250

## 2013-11-10 NOTE — Progress Notes (Signed)
Patient BP has been soft in the 90s to low 100s throughout the day.  Nitro and cardizem gtts infusing.  80mg  of lasix scheduled.  Dr. Oleta Mouse called to verify administration of medication in lieu of soft blood pressures.  Will give 40mg  of lasix and titrate nitro to off per Dr. Oleta Mouse.

## 2013-11-10 NOTE — Progress Notes (Signed)
Patient's daughter at bedside advised that she provided the patient with crackers and water.  Physical assessment complete (see doc flow sheet).  NAD noted.  Aspiration precautions reiterated; patient and daughter verbalized understanding.  Will continue to monitor.

## 2013-11-10 NOTE — Progress Notes (Signed)
Consulting cardiologist:  Subjective:    She is complaining of worsening dyspnea and trouble swallowing. Choked on grits this morning and on water later. Breathing worsened afterwards.  Objective:   Temp:  [97.7 F (36.5 C)-98.8 F (37.1 C)] 98.8 F (37.1 C) (01/17 1145) Pulse Rate:  [33-134] 80 (01/17 1150) Resp:  [18-37] 31 (01/17 1150) BP: (92-208)/(50-133) 92/50 mmHg (01/17 1150) SpO2:  [79 %-95 %] 95 % (01/17 1150) Weight:  [249 lb 1.9 oz (113 kg)] 249 lb 1.9 oz (113 kg) (01/17 0700) Last BM Date: 11/07/13  Filed Weights   11/08/13 2315 11/09/13 0446 11/10/13 0700  Weight: 248 lb 10.9 oz (112.8 kg) 248 lb 10.9 oz (112.8 kg) 249 lb 1.9 oz (113 kg)    Intake/Output Summary (Last 24 hours) at 11/10/13 1154 Last data filed at 11/10/13 1150  Gross per 24 hour  Intake    652 ml  Output   2450 ml  Net  -1798 ml    Telemetry: Atrial fib in the 90's.   Exam:  General: No acute distress.Dyspnea  HEENT: Conjunctiva and lids normal, oropharynx clear.  Lungs: Expiratory wheezes.   Cardiac: No elevated JVP or bruits. RRR, no gallop or rub.   Abdomen: Normoactive bowel sounds, nontender, nondistended.  Extremities: No pitting edema, distal pulses full.  Neuropsychiatric: Alert and oriented x3, affect appropriate.   Lab Results:  Basic Metabolic Panel:  Recent Labs Lab 11/08/13 1335 11/09/13 0308  NA 139 141  K 3.0* 3.0*  CL 100 102  CO2 23 22  GLUCOSE 179* 136*  BUN 18 19  CREATININE 0.70 0.64  CALCIUM 9.4 9.2    Liver Function Tests:  Recent Labs Lab 11/08/13 1335 11/09/13 0308  AST 19 25  ALT 25 31  ALKPHOS 88 91  BILITOT 0.7 0.6  PROT 6.7 6.8  ALBUMIN 3.1* 3.2*    CBC:  Recent Labs Lab 11/08/13 1335 11/09/13 0308 11/10/13 0345  WBC 10.0 13.2* 12.9*  HGB 12.3 12.7 11.8*  HCT 36.6 37.7 34.8*  MCV 91.0 91.1 89.5  PLT 322 344 371    Cardiac Enzymes:  Recent Labs Lab 11/08/13 2355 11/09/13 0525 11/09/13 1113  TROPONINI  <0.30 <0.30 <0.30    BNP:  Recent Labs  10/12/13 1945 11/08/13 1337  PROBNP 1518.0* 1460.0*    Coagulation:  Recent Labs Lab 11/08/13 2248  INR 1.08    Radiology: Dg Chest Port 1 View  11/08/2013   CLINICAL DATA:  Shortness of breath, tachycardia  EXAM: PORTABLE CHEST - 1 VIEW  COMPARISON:  10/12/2013  FINDINGS: Cardiomegaly evident with central vascular and interstitial prominence compatible with early edema. Basilar atelectasis noted. No large effusion or pneumothorax. Atherosclerosis of the aorta. Trachea is midline.  IMPRESSION: Cardiomegaly with mild central hilar developing edema.   Electronically Signed   By: Daryll Brod M.D.   On: 11/08/2013 14:12     Medications:   Scheduled Medications: . atorvastatin  20 mg Oral QHS  . calcium carbonate  1,000 mg of elemental calcium Oral Q breakfast  . carvedilol  6.25 mg Oral BID WC  . cholecalciferol  2,000 Units Oral Daily  . furosemide  40 mg Intravenous Q12H  . irbesartan  150 mg Oral Daily  . multivitamin with minerals  1 tablet Oral Daily  . rivaroxaban  20 mg Oral Q supper  . sodium chloride  3 mL Intravenous Q12H     Infusions: . sodium chloride 5 mL/hr at 11/09/13 2146  . diltiazem (  CARDIZEM) infusion 10 mg/hr (11/10/13 1150)     PRN Medications:  sodium chloride, acetaminophen, ALPRAZolam, cyclobenzaprine, fluticasone, HYDROcodone-acetaminophen, levalbuterol, nitroGLYCERIN, ondansetron (ZOFRAN) IV, sodium chloride, zolpidem   Assessment and Plan:   1. Atrial fib: Heart rate is controlled on dilt gtt. BP is soft. Will decrease dilt gtt to 10ng/hour now that she is on BB. Continue on Xarelto. Echo is still pending to be completed.   2. Chronic CHF: Has diuresed but is is dyspneac. So far 500 cc on IV lasix. Potassium was low yesterday. Will repeat BMET this am. She is on replacement.  3. COPD: Breathing status is worsened today per nurses and daughter. ABG.  I will add Xopenex breathing treatments. Have  repeat CXR to evaluate for aspiration. She will be kept NPO and will have swallowing study completed.     Phill Myron. Purcell Nails NP [facility] 11/10/2013, 11:54 AM

## 2013-11-10 NOTE — Progress Notes (Signed)
  Echocardiogram 2D Echocardiogram has been performed.  Basilia Jumbo 11/10/2013, 3:40 PM

## 2013-11-10 NOTE — Progress Notes (Signed)
Consulting cardiologist:  Subjective:    She is complaining of worsening dyspnea and trouble swallowing. Choked on grits this morning and on water later. Breathing worsened afterwards.  Objective:   Temp:  [97.7 F (36.5 C)-98.8 F (37.1 C)] 98.8 F (37.1 C) (01/17 1145) Pulse Rate:  [33-134] 80 (01/17 1150) Resp:  [18-37] 31 (01/17 1150) BP: (92-208)/(50-133) 92/50 mmHg (01/17 1150) SpO2:  [79 %-95 %] 95 % (01/17 1150) Weight:  [113 kg (249 lb 1.9 oz)] 113 kg (249 lb 1.9 oz) (01/17 0700) Last BM Date: 11/07/13  Filed Weights   11/08/13 2315 11/09/13 0446 11/10/13 0700  Weight: 112.8 kg (248 lb 10.9 oz) 112.8 kg (248 lb 10.9 oz) 113 kg (249 lb 1.9 oz)    Intake/Output Summary (Last 24 hours) at 11/10/13 1245 Last data filed at 11/10/13 1150  Gross per 24 hour  Intake    652 ml  Output   2450 ml  Net  -1798 ml    Telemetry: Atrial fib in the 90's.   Exam:  General: No acute distress.Dyspnea  HEENT: Conjunctiva and lids normal, oropharynx clear.  Lungs: Expiratory wheezes.   Cardiac: No elevated JVP or bruits. RRR, no gallop or rub.   Abdomen: Normoactive bowel sounds, nontender, nondistended.  Extremities: No pitting edema, distal pulses full.  Neuropsychiatric: Alert and oriented x3, affect appropriate.   Lab Results:  Basic Metabolic Panel:  Recent Labs Lab 11/08/13 1335 11/09/13 0308  NA 139 141  K 3.0* 3.0*  CL 100 102  CO2 23 22  GLUCOSE 179* 136*  BUN 18 19  CREATININE 0.70 0.64  CALCIUM 9.4 9.2    Liver Function Tests:  Recent Labs Lab 11/08/13 1335 11/09/13 0308  AST 19 25  ALT 25 31  ALKPHOS 88 91  BILITOT 0.7 0.6  PROT 6.7 6.8  ALBUMIN 3.1* 3.2*    CBC:  Recent Labs Lab 11/08/13 1335 11/09/13 0308 11/10/13 0345  WBC 10.0 13.2* 12.9*  HGB 12.3 12.7 11.8*  HCT 36.6 37.7 34.8*  MCV 91.0 91.1 89.5  PLT 322 344 371    Cardiac Enzymes:  Recent Labs Lab 11/08/13 2355 11/09/13 0525 11/09/13 1113  TROPONINI  <0.30 <0.30 <0.30    BNP:  Recent Labs  10/12/13 1945 11/08/13 1337  PROBNP 1518.0* 1460.0*    Coagulation:  Recent Labs Lab 11/08/13 2248  INR 1.08    Radiology: Dg Chest Port 1 View  11/08/2013   CLINICAL DATA:  Shortness of breath, tachycardia  EXAM: PORTABLE CHEST - 1 VIEW  COMPARISON:  10/12/2013  FINDINGS: Cardiomegaly evident with central vascular and interstitial prominence compatible with early edema. Basilar atelectasis noted. No large effusion or pneumothorax. Atherosclerosis of the aorta. Trachea is midline.  IMPRESSION: Cardiomegaly with mild central hilar developing edema.   Electronically Signed   By: Daryll Brod M.D.   On: 11/08/2013 14:12     Medications:   Scheduled Medications: . atorvastatin  20 mg Oral QHS  . calcium carbonate  1,000 mg of elemental calcium Oral Q breakfast  . carvedilol  6.25 mg Oral BID WC  . cholecalciferol  2,000 Units Oral Daily  . furosemide  80 mg Intravenous Q12H  . irbesartan  150 mg Oral Daily  . multivitamin with minerals  1 tablet Oral Daily  . rivaroxaban  20 mg Oral Q supper  . sodium chloride  3 mL Intravenous Q12H    Infusions: . sodium chloride 5 mL/hr at 11/09/13 2146  . diltiazem (CARDIZEM)  infusion 10 mg/hr (11/10/13 1150)  . nitroGLYCERIN      PRN Medications: sodium chloride, acetaminophen, ALPRAZolam, cyclobenzaprine, fluticasone, HYDROcodone-acetaminophen, levalbuterol, nitroGLYCERIN, ondansetron (ZOFRAN) IV, sodium chloride, zolpidem   Assessment and Plan:   1. Atrial fib: Heart rate is controlled on dilt gtt. BP is soft. Will decrease dilt gtt to 10ng/hour now that she is on BB. Continue on Xarelto. Echo is still pending to be completed.   2. Chronic CHF: Has diuresed but is is dyspneac. So far 500 cc on IV lasix. Potassium was low yesterday. Will repeat BMET this am. She is on replacement.  3. COPD: Breathing status is worsened today per nurses and daughter. ABG.  I will add Xopenex breathing  treatments. Have repeat CXR to evaluate for aspiration. She will be kept NPO and will have swallowing study completed.     Phill Myron. Purcell Nails NP [facility] 11/10/2013, 12:45 PM  I have seen and examined the patient along with Phill Myron. [place] NP.  I have reviewed the chart, notes and new data.  I agree with NP's note.  Key new complaints: dyspnea is worse Key examination changes: no wheezes, no stridor, prominent bibasilar moist rales; irregular rhythm Key new findings / data: ABG shows hypoxemia and mild resp alkalosis CXR c/w pulmonary edema BNP high, renal function good  PLAN: CHF persists (may be worse) despite adequate AF rate control and I think is the major cause of her respiratory failure Increase diuretics Add low dose IV NTG for unloading (dose limited by BP) Check echo today Avoid escalating negative inotropes (including beta blockers and diltiazem) - I suspect she will have systolic dysfunction. Try to wean off IV diltiazem if ventricular rate allows. If additional rate control is needed, prefer IV amiodarone, even if early cardioversion before TEE is a possibility. On anticoagulant  Discussed possibility of worsening respiratory failure - if needed she agrees to undergo endotracheal intubation.   Sanda Klein, MD, [place] [phone] 11/10/2013, 12:45 PM

## 2013-11-10 NOTE — Progress Notes (Addendum)
Patient reports that she got "strangled" on some grits this morning.  Reports that she ate her lunch with no issue and has been drinking water all throughout the day.  Patient upset and did not understand why she had not received a dinner tray.  Advised patient that she may be an aspiration risk and is to have nothing by mouth until after a swallow evaluation has been conducted.  Patient extremely upset and insisted on calling the doctor for a dinner tray.  Dr.Liu paged and updated on patient.  Bedside swallow evaluation recommended.  Patient is to remain NPO at this time.  Daughter and patient educated on the rationale of this decision.  Understanding verbalized.  Will continue to monitor.

## 2013-11-11 DIAGNOSIS — I428 Other cardiomyopathies: Secondary | ICD-10-CM

## 2013-11-11 DIAGNOSIS — I5021 Acute systolic (congestive) heart failure: Secondary | ICD-10-CM | POA: Diagnosis present

## 2013-11-11 LAB — CBC
HEMATOCRIT: 32.3 % — AB (ref 36.0–46.0)
Hemoglobin: 11.2 g/dL — ABNORMAL LOW (ref 12.0–15.0)
MCH: 30.4 pg (ref 26.0–34.0)
MCHC: 34.7 g/dL (ref 30.0–36.0)
MCV: 87.5 fL (ref 78.0–100.0)
Platelets: 358 10*3/uL (ref 150–400)
RBC: 3.69 MIL/uL — ABNORMAL LOW (ref 3.87–5.11)
RDW: 14.6 % (ref 11.5–15.5)
WBC: 12.3 10*3/uL — ABNORMAL HIGH (ref 4.0–10.5)

## 2013-11-11 LAB — BASIC METABOLIC PANEL
BUN: 21 mg/dL (ref 6–23)
CHLORIDE: 92 meq/L — AB (ref 96–112)
CO2: 23 meq/L (ref 19–32)
CREATININE: 0.63 mg/dL (ref 0.50–1.10)
Calcium: 8.9 mg/dL (ref 8.4–10.5)
GFR calc non Af Amer: >90 mL/min (ref >90)
Glucose, Bld: 155 mg/dL — ABNORMAL HIGH (ref 70–99)
Potassium: 3 mEq/L — ABNORMAL LOW (ref 3.7–5.3)
Sodium: 131 mEq/L — ABNORMAL LOW (ref 137–147)

## 2013-11-11 MED ORDER — AMIODARONE HCL IN DEXTROSE 360-4.14 MG/200ML-% IV SOLN
30.0000 mg/h | INTRAVENOUS | Status: DC
  Administered 2013-11-12 – 2013-11-14 (×6): 30 mg/h via INTRAVENOUS
  Filled 2013-11-11 (×17): qty 200

## 2013-11-11 MED ORDER — AMIODARONE HCL IN DEXTROSE 360-4.14 MG/200ML-% IV SOLN
60.0000 mg/h | INTRAVENOUS | Status: AC
  Administered 2013-11-11 (×2): 60 mg/h via INTRAVENOUS
  Filled 2013-11-11 (×2): qty 200

## 2013-11-11 MED ORDER — AMIODARONE LOAD VIA INFUSION
150.0000 mg | Freq: Once | INTRAVENOUS | Status: AC
  Administered 2013-11-11: 150 mg via INTRAVENOUS
  Filled 2013-11-11: qty 83.34

## 2013-11-11 MED ORDER — FUROSEMIDE 10 MG/ML IJ SOLN
80.0000 mg | Freq: Two times a day (BID) | INTRAMUSCULAR | Status: DC
  Administered 2013-11-11: 80 mg via INTRAVENOUS
  Filled 2013-11-11 (×2): qty 8

## 2013-11-11 NOTE — Progress Notes (Signed)
I agree with the assessment performed and documented by Fredderick Severance, Stage manager.

## 2013-11-11 NOTE — Progress Notes (Signed)
Subjective:  Breathing is substantially better after diuresis. Negative 1.5 L since yesterday. Better rate control (around 100 bpm) No longer orthopneic.  Echo shows a dilated left ventricle with severe systolic dysfunction, IN<86% with global hypokinesis.  Objective:  Temp:  [97.6 F (36.4 C)-98.5 F (36.9 C)] 97.9 F (36.6 C) (01/18 0757) Pulse Rate:  [28-196] 49 (01/18 0700) Resp:  [21-40] 23 (01/18 0700) BP: (80-145)/(45-102) 110/81 mmHg (01/18 0700) SpO2:  [92 %-100 %] 92 % (01/18 0700) Weight:  [112.2 kg (247 lb 5.7 oz)] 112.2 kg (247 lb 5.7 oz) (01/18 1000) Weight change:   Intake/Output from previous day: 01/17 0701 - 01/18 0700 In: 902.7 [P.O.:720; I.V.:182.7] Out: 501 [Urine:501]  Intake/Output from this shift:    Medications: Current Facility-Administered Medications  Medication Dose Route Frequency Provider Last Rate Last Dose  . 0.9 %  sodium chloride infusion  250 mL Intravenous PRN Rhonda G Barrett, PA-C      . 0.9 %  sodium chloride infusion   Intravenous Continuous Josue Hector, MD 5 mL/hr at 11/11/13 0400    . acetaminophen (TYLENOL) tablet 650 mg  650 mg Oral Q4H PRN Rhonda G Barrett, PA-C      . ALPRAZolam Duanne Moron) tablet 0.25 mg  0.25 mg Oral BID PRN Evelene Croon Barrett, PA-C   0.25 mg at 11/10/13 2235  . atorvastatin (LIPITOR) tablet 20 mg  20 mg Oral QHS Sueanne Margarita, MD   20 mg at 11/10/13 2235  . calcium carbonate (OS-CAL - dosed in mg of elemental calcium) tablet 1,000 mg of elemental calcium  1,000 mg of elemental calcium Oral Q breakfast Ovid Curd R. Pickering, MD   1,000 mg of elemental calcium at 11/11/13 1020  . carvedilol (COREG) tablet 6.25 mg  6.25 mg Oral BID WC Josue Hector, MD   6.25 mg at 11/11/13 1019  . cholecalciferol (VITAMIN D) tablet 2,000 Units  2,000 Units Oral Daily Lonn Georgia, PA-C   2,000 Units at 11/10/13 0914  . cyclobenzaprine (FLEXERIL) tablet 10 mg  10 mg Oral TID PRN Evelene Croon Barrett, PA-C      . diltiazem  (CARDIZEM) 100 mg in dextrose 5 % 100 mL infusion  5-15 mg/hr Intravenous Titrated Sueanne Margarita, MD 5 mL/hr at 11/11/13 0400 5 mg/hr at 11/11/13 0400  . fluticasone (FLONASE) 50 MCG/ACT nasal spray 1 spray  1 spray Each Nare Daily PRN Rhonda G Barrett, PA-C      . furosemide (LASIX) injection 80 mg  80 mg Intravenous Q12H Kenzli Barritt, MD   80 mg at 11/11/13 1020  . HYDROcodone-acetaminophen (NORCO/VICODIN) 5-325 MG per tablet 1-2 tablet  1-2 tablet Oral Q6H PRN Rhonda G Barrett, PA-C      . irbesartan (AVAPRO) tablet 150 mg  150 mg Oral Daily Rhonda G Barrett, PA-C   150 mg at 11/11/13 1019  . levalbuterol (XOPENEX) nebulizer solution 0.63 mg  0.63 mg Nebulization Q6H PRN Lendon Colonel, NP      . multivitamin with minerals tablet 1 tablet  1 tablet Oral Daily Rhonda G Barrett, PA-C   1 tablet at 11/11/13 1019  . nitroGLYCERIN (NITROSTAT) SL tablet 0.4 mg  0.4 mg Sublingual Q5 Min x 3 PRN Rhonda G Barrett, PA-C      . nitroGLYCERIN 0.2 mg/mL in dextrose 5 % infusion  5 mcg/min Intravenous Titrated Salinda Snedeker, MD   5 mcg/min at 11/10/13 2300  . ondansetron (ZOFRAN) injection 4 mg  4 mg Intravenous Q6H  PRN Rhonda G Barrett, PA-C      . Rivaroxaban (XARELTO) tablet 20 mg  20 mg Oral Q supper Benjamin G Mancheril, RPH   20 mg at 11/10/13 1648  . sodium chloride 0.9 % injection 3 mL  3 mL Intravenous Q12H Rhonda G Barrett, PA-C   3 mL at 11/10/13 2242  . sodium chloride 0.9 % injection 3 mL  3 mL Intravenous PRN Rhonda G Barrett, PA-C   3 mL at 11/10/13 0916  . zolpidem (AMBIEN) tablet 5 mg  5 mg Oral QHS PRN Rhonda G Barrett, PA-C   5 mg at 11/10/13 2235    Physical Exam: General appearance: alert, cooperative and no distress Neck: JVD - 6 cm above sternal notch, no adenopathy, no carotid bruit, supple, symmetrical, trachea midline and thyroid not enlarged, symmetric, no tenderness/mass/nodules Lungs: clear to auscultation bilaterally Heart: regular rate and rhythm, S1, S2 normal, S3  present and no rub Abdomen: soft, non-tender; bowel sounds normal; no masses,  no organomegaly Extremities: edema 1+ ankles Pulses: 2+ and symmetric Skin: Skin color, texture, turgor normal. No rashes or lesions Neurologic: Alert and oriented X 3, normal strength and tone. Normal symmetric reflexes. Normal coordination and gait  Lab Results: Results for orders placed during the hospital encounter of 11/08/13 (from the past 48 hour(s))  CBC     Status: Abnormal   Collection Time    11/10/13  3:45 AM      Result Value Range   WBC 12.9 (*) 4.0 - 10.5 K/uL   RBC 3.89  3.87 - 5.11 MIL/uL   Hemoglobin 11.8 (*) 12.0 - 15.0 g/dL   HCT 34.8 (*) 36.0 - 46.0 %   MCV 89.5  78.0 - 100.0 fL   MCH 30.3  26.0 - 34.0 pg   MCHC 33.9  30.0 - 36.0 g/dL   RDW 14.7  11.5 - 15.5 %   Platelets 371  150 - 400 K/uL  POCT I-STAT 3, BLOOD GAS (G3+)     Status: Abnormal   Collection Time    11/10/13 12:24 PM      Result Value Range   pH, Arterial 7.473 (*) 7.350 - 7.450   pCO2 arterial 27.8 (*) 35.0 - 45.0 mmHg   pO2, Arterial 56.0 (*) 80.0 - 100.0 mmHg   Bicarbonate 20.3  20.0 - 24.0 mEq/L   TCO2 21  0 - 100 mmol/L   O2 Saturation 91.0     Acid-base deficit 2.0  0.0 - 2.0 mmol/L   Patient temperature 98.8 F     Collection site RADIAL, ALLEN'S TEST ACCEPTABLE     Drawn by Operator     Sample type ARTERIAL    BASIC METABOLIC PANEL     Status: Abnormal   Collection Time    11/11/13  1:20 AM      Result Value Range   Sodium 131 (*) 137 - 147 mEq/L   Comment: DELTA CHECK NOTED   Potassium 3.0 (*) 3.7 - 5.3 mEq/L   Chloride 92 (*) 96 - 112 mEq/L   Comment: DELTA CHECK NOTED   CO2 23  19 - 32 mEq/L   Glucose, Bld 155 (*) 70 - 99 mg/dL   BUN 21  6 - 23 mg/dL   Creatinine, Ser 0.63  0.50 - 1.10 mg/dL   Calcium 8.9  8.4 - 10.5 mg/dL   GFR calc non Af Amer >90  >90 mL/min   GFR calc Af Amer >90  >90 mL/min   Comment: (  NOTE)     The eGFR has been calculated using the CKD EPI equation.     This  calculation has not been validated in all clinical situations.     eGFR's persistently <90 mL/min signify possible Chronic Kidney     Disease.  CBC     Status: Abnormal   Collection Time    11/11/13  1:20 AM      Result Value Range   WBC 12.3 (*) 4.0 - 10.5 K/uL   RBC 3.69 (*) 3.87 - 5.11 MIL/uL   Hemoglobin 11.2 (*) 12.0 - 15.0 g/dL   HCT 32.3 (*) 36.0 - 46.0 %   MCV 87.5  78.0 - 100.0 fL   MCH 30.4  26.0 - 34.0 pg   MCHC 34.7  30.0 - 36.0 g/dL   RDW 14.6  11.5 - 15.5 %   Platelets 358  150 - 400 K/uL    Imaging: Imaging results have been reviewed and Dg Chest Port 1 View  11/10/2013   CLINICAL DATA:  History of breast malignancy and COPD and tobacco use  EXAM: PORTABLE CHEST - 1 VIEW  COMPARISON:  Portable chest x-ray of November 08, 2013  FINDINGS: The cardiopericardial silhouette remains enlarged. The pulmonary vascularity remains indistinct. The lungs are better inflated today. The interstitial markings of both lungs are increased but not greatly changed from the previous study. The left hemidiaphragm remains partially obscured.  IMPRESSION: The findings are most compatible with congestive heart failure with pulmonary interstitial edema. I cannot exclude alveolar edema or alveolar pneumonia in the right infrahilar region. When the patient can tolerate the procedure, a PA and lateral chest x-ray would be useful.   Electronically Signed   By: David  Jordan   On: 11/10/2013 15:37    Assessment:  Active Problems:   Coronary artery calcification seen on CAT scan   Atrial fibrillation, rapid   Congestive dilated cardiomyopathy   Acute systolic heart failure   Plan:  1. Differential diagnosis is tachycardia related cardiomyopathy, idiopathic dilated cardiomyopathy complicated by AF or painless ischemic cardiomyopathy (note presence of coronary calcifications on CT chest). 2. Will stop negative inotrope diltiazem. 3. Continue diuretics IV another 24h 4. Start amiodarone - the  likelihood of early AF recurrence after cardioversion is high, given the echo findings. Better chance of durable maintenance of NSR after amiodarone loading. I doubt she will have chemical cardioversion. Hopefully, will use amiodarone relatively short term only (months) 5. She is already receiving ARB, carvedilol and NOAC  Time Spent Directly with Patient:  30 minutes  Length of Stay:  LOS: 3 days    CROITORU,MIHAI 11/11/2013, 11:45 AM     

## 2013-11-11 NOTE — Progress Notes (Addendum)
Received call from [facility] stating that patient was off monitor. In to check on patient, found leaning against closet door without gown, monitoring wires, and IV. Patient  unsteady on feet and unable to walk to chair or bed. Assisted patient to floor. Patient stated she was attempting to go to bathroom. Small red abrasion noted to right side of back. No open areas noted to skin, no bleeding, or bruising. Patient denied pain. Primary nurse Tish Eilleen Davoli RN at bedside and will notify on call doctor. Bed alarm turned to sensitive setting. Patient re-educated on use of call bell, voiced and demonstrated understanding. Will initiate toilet scheduling. See flow sheet for vital signs.   Late Entry:  Dr. Oleta Mouse notified of patient fall.

## 2013-11-11 NOTE — Progress Notes (Signed)
Spoke with Dr Sallyanne Kuster regarding low BPs.  70-80s/40s.  Pt had rec'd amio bolus at 1345.  No new orders.  BP set to cycle Q15 minutes for now.  Pt denies dizziness, but said she did feel a "little weak".  Reiterated to pt to call if she needed to get out of the bed for any reason.  Pt stated that she would.  Daughter in the room.    Carol Ada, RN

## 2013-11-11 NOTE — Progress Notes (Signed)
  Amiodarone Drug - Drug Interaction Consult Note  Recommendations: Continue current therapy and monitor for myopathies, bleeding, bradycardia, and hypokalemia.  Amiodarone is metabolized by the cytochrome P450 system and therefore has the potential to cause many drug interactions. Amiodarone has an average plasma half-life of 50 days (range 20 to 100 days).   There is potential for drug interactions to occur several weeks or months after stopping treatment and the onset of drug interactions may be slow after initiating amiodarone.   [x]  Statins: Increased risk of myopathy. Simvastatin- restrict dose to 20mg  daily. Other statins: counsel patients to report any muscle pain or weakness immediately.  [x]  Anticoagulants: Amiodarone can increase anticoagulant effect. Consider warfarin dose reduction. Patients should be monitored closely and the dose of anticoagulant altered accordingly, remembering that amiodarone levels take several weeks to stabilize.  [x]  Beta blockers: increased risk of bradycardia, AV block and myocardial depression. Sotalol - avoid concomitant use.  [x]   Calcium channel blockers (diltiazem and verapamil): increased risk of bradycardia, AV block and myocardial depression.  [x]  Diuretics: increased risk of cardiotoxicity if hypokalemia occurs.   Thank You,  Erik Obey  11/11/2013 3:22 PM

## 2013-11-12 ENCOUNTER — Encounter (HOSPITAL_COMMUNITY): Payer: Medicare Other | Admitting: Critical Care Medicine

## 2013-11-12 ENCOUNTER — Inpatient Hospital Stay (HOSPITAL_COMMUNITY): Payer: Medicare Other | Admitting: Critical Care Medicine

## 2013-11-12 ENCOUNTER — Encounter (HOSPITAL_COMMUNITY)
Admission: EM | Disposition: A | Discharge status: Home / Self Care | Payer: Self-pay | Source: Home / Self Care | Attending: Cardiology

## 2013-11-12 ENCOUNTER — Encounter (HOSPITAL_COMMUNITY): Payer: Self-pay

## 2013-11-12 DIAGNOSIS — I059 Rheumatic mitral valve disease, unspecified: Secondary | ICD-10-CM

## 2013-11-12 DIAGNOSIS — I4891 Unspecified atrial fibrillation: Secondary | ICD-10-CM

## 2013-11-12 HISTORY — PX: CARDIOVERSION: SHX1299

## 2013-11-12 HISTORY — PX: TEE WITHOUT CARDIOVERSION: SHX5443

## 2013-11-12 LAB — BASIC METABOLIC PANEL
BUN: 19 mg/dL (ref 6–23)
CHLORIDE: 86 meq/L — AB (ref 96–112)
CO2: 22 meq/L (ref 19–32)
Calcium: 8.6 mg/dL (ref 8.4–10.5)
Creatinine, Ser: 0.55 mg/dL (ref 0.50–1.10)
GFR calc Af Amer: >90 mL/min (ref >90)
GFR calc non Af Amer: >90 mL/min (ref >90)
GLUCOSE: 343 mg/dL — AB (ref 70–99)
Potassium: 4.2 mEq/L (ref 3.7–5.3)
Sodium: 128 mEq/L — ABNORMAL LOW (ref 137–147)

## 2013-11-12 LAB — CBC
HCT: 30.4 % — ABNORMAL LOW (ref 36.0–46.0)
HEMOGLOBIN: 10.6 g/dL — AB (ref 12.0–15.0)
MCH: 30.1 pg (ref 26.0–34.0)
MCHC: 34.9 g/dL (ref 30.0–36.0)
MCV: 86.4 fL (ref 78.0–100.0)
Platelets: 353 10*3/uL (ref 150–400)
RBC: 3.52 MIL/uL — AB (ref 3.87–5.11)
RDW: 14.4 % (ref 11.5–15.5)
WBC: 10.4 10*3/uL (ref 4.0–10.5)

## 2013-11-12 SURGERY — CARDIOVERSION
Anesthesia: General

## 2013-11-12 SURGERY — ECHOCARDIOGRAM, TRANSESOPHAGEAL
Anesthesia: Moderate Sedation

## 2013-11-12 MED ORDER — BUTAMBEN-TETRACAINE-BENZOCAINE 2-2-14 % EX AERO
INHALATION_SPRAY | CUTANEOUS | Status: DC | PRN
  Administered 2013-11-12: 2 via TOPICAL

## 2013-11-12 MED ORDER — FENTANYL CITRATE 0.05 MG/ML IJ SOLN
INTRAMUSCULAR | Status: AC
  Filled 2013-11-12: qty 2

## 2013-11-12 MED ORDER — FENTANYL CITRATE 0.05 MG/ML IJ SOLN
INTRAMUSCULAR | Status: DC | PRN
  Administered 2013-11-12: 25 ug via INTRAVENOUS

## 2013-11-12 MED ORDER — MIDAZOLAM HCL 5 MG/ML IJ SOLN
INTRAMUSCULAR | Status: AC
  Filled 2013-11-12: qty 2

## 2013-11-12 MED ORDER — MIDAZOLAM HCL 10 MG/2ML IJ SOLN
INTRAMUSCULAR | Status: DC | PRN
  Administered 2013-11-12: 2 mg via INTRAVENOUS

## 2013-11-12 MED ORDER — FUROSEMIDE 10 MG/ML IJ SOLN
80.0000 mg | Freq: Three times a day (TID) | INTRAMUSCULAR | Status: DC
  Administered 2013-11-12 – 2013-11-15 (×9): 80 mg via INTRAVENOUS
  Filled 2013-11-12 (×10): qty 8

## 2013-11-12 MED ORDER — SPIRONOLACTONE 12.5 MG HALF TABLET
12.5000 mg | ORAL_TABLET | Freq: Every day | ORAL | Status: DC
  Administered 2013-11-12 – 2013-11-13 (×2): 12.5 mg via ORAL
  Filled 2013-11-12 (×2): qty 1

## 2013-11-12 MED ORDER — PROPOFOL 10 MG/ML IV BOLUS
INTRAVENOUS | Status: DC | PRN
  Administered 2013-11-12: 50 mg via INTRAVENOUS

## 2013-11-12 MED ORDER — POTASSIUM CHLORIDE CRYS ER 20 MEQ PO TBCR
40.0000 meq | EXTENDED_RELEASE_TABLET | Freq: Once | ORAL | Status: AC
  Administered 2013-11-12: 40 meq via ORAL
  Filled 2013-11-12: qty 2

## 2013-11-12 MED ORDER — LIDOCAINE HCL (CARDIAC) 20 MG/ML IV SOLN
INTRAVENOUS | Status: DC | PRN
  Administered 2013-11-12: 60 mg via INTRAVENOUS

## 2013-11-12 NOTE — Anesthesia Preprocedure Evaluation (Addendum)
Anesthesia Evaluation  Patient identified by MRN, date of birth, ID band Patient awake    Reviewed: Allergy & Precautions, H&P , NPO status , Patient's Chart, lab work & pertinent test results, reviewed documented beta blocker date and time   Airway Mallampati: II  Neck ROM: full    Dental  (+) Dental Advisory Given   Pulmonary shortness of breath, COPDformer smoker,          Cardiovascular hypertension, Pt. on home beta blockers + CAD, +CHF and + DOE + dysrhythmias Atrial Fibrillation     Neuro/Psych    GI/Hepatic   Endo/Other  Morbid obesity  Renal/GU      Musculoskeletal   Abdominal   Peds  Hematology   Anesthesia Other Findings   Reproductive/Obstetrics                          Anesthesia Physical Anesthesia Plan  ASA: III  Anesthesia Plan: General   Post-op Pain Management:    Induction: Intravenous  Airway Management Planned: Mask  Additional Equipment:   Intra-op Plan:   Post-operative Plan:   Informed Consent: I have reviewed the patients History and Physical, chart, labs and discussed the procedure including the risks, benefits and alternatives for the proposed anesthesia with the patient or authorized representative who has indicated his/her understanding and acceptance.   Dental advisory given  Plan Discussed with: Anesthesiologist and Surgeon  Anesthesia Plan Comments:         Anesthesia Quick Evaluation

## 2013-11-12 NOTE — Transfer of Care (Signed)
Immediate Anesthesia Transfer of Care Note  Patient: Cynthia Herrera  Procedure(s) Performed: Procedure(s): CARDIOVERSION (N/A)  Patient Location: Endo  Anesthesia Type:General  Level of Consciousness: awake, alert  and oriented  Airway & Oxygen Therapy: Patient Spontanous Breathing and Patient connected to nasal cannula oxygen  Post-op Assessment: Report given to PACU RN, Post -op Vital signs reviewed and stable and Patient moving all extremities X 4  Post vital signs: Reviewed and stable  Complications: No apparent anesthesia complications

## 2013-11-12 NOTE — Preoperative (Signed)
Beta Blockers   Reason not to administer Beta Blockers:Not Applicable 

## 2013-11-12 NOTE — Interval H&P Note (Signed)
History and Physical Interval Note:  11/12/2013 9:42 AM  Cynthia Herrera  has presented today for surgery, with the diagnosis of AFIB  The various methods of treatment have been discussed with the patient and family. After consideration of risks, benefits and other options for treatment, the patient has consented to  Procedure(s): TRANSESOPHAGEAL ECHOCARDIOGRAM (TEE) (N/A) CARDIOVERSION (N/A) as a surgical intervention .  The patient's history has been reviewed, patient examined, no change in status, stable for surgery.  I have reviewed the patient's chart and labs.  Questions were answered to the patient's satisfaction.     Darden Amber.

## 2013-11-12 NOTE — Progress Notes (Signed)
Dr. Cathie Olden aware of 149ml bolus, ordered NS remain wide open throughout procedure to support hemodymamics

## 2013-11-12 NOTE — H&P (View-Only) (Signed)
Subjective:  Breathing is substantially better after diuresis. Negative 1.5 L since yesterday. Better rate control (around 100 bpm) No longer orthopneic.  Echo shows a dilated left ventricle with severe systolic dysfunction, IN<86% with global hypokinesis.  Objective:  Temp:  [97.6 F (36.4 C)-98.5 F (36.9 C)] 97.9 F (36.6 C) (01/18 0757) Pulse Rate:  [28-196] 49 (01/18 0700) Resp:  [21-40] 23 (01/18 0700) BP: (80-145)/(45-102) 110/81 mmHg (01/18 0700) SpO2:  [92 %-100 %] 92 % (01/18 0700) Weight:  [112.2 kg (247 lb 5.7 oz)] 112.2 kg (247 lb 5.7 oz) (01/18 1000) Weight change:   Intake/Output from previous day: 01/17 0701 - 01/18 0700 In: 902.7 [P.O.:720; I.V.:182.7] Out: 501 [Urine:501]  Intake/Output from this shift:    Medications: Current Facility-Administered Medications  Medication Dose Route Frequency Provider Last Rate Last Dose  . 0.9 %  sodium chloride infusion  250 mL Intravenous PRN Rhonda G Barrett, PA-C      . 0.9 %  sodium chloride infusion   Intravenous Continuous Josue Hector, MD 5 mL/hr at 11/11/13 0400    . acetaminophen (TYLENOL) tablet 650 mg  650 mg Oral Q4H PRN Rhonda G Barrett, PA-C      . ALPRAZolam Duanne Moron) tablet 0.25 mg  0.25 mg Oral BID PRN Evelene Croon Barrett, PA-C   0.25 mg at 11/10/13 2235  . atorvastatin (LIPITOR) tablet 20 mg  20 mg Oral QHS Sueanne Margarita, MD   20 mg at 11/10/13 2235  . calcium carbonate (OS-CAL - dosed in mg of elemental calcium) tablet 1,000 mg of elemental calcium  1,000 mg of elemental calcium Oral Q breakfast Ovid Curd R. Pickering, MD   1,000 mg of elemental calcium at 11/11/13 1020  . carvedilol (COREG) tablet 6.25 mg  6.25 mg Oral BID WC Josue Hector, MD   6.25 mg at 11/11/13 1019  . cholecalciferol (VITAMIN D) tablet 2,000 Units  2,000 Units Oral Daily Lonn Georgia, PA-C   2,000 Units at 11/10/13 0914  . cyclobenzaprine (FLEXERIL) tablet 10 mg  10 mg Oral TID PRN Evelene Croon Barrett, PA-C      . diltiazem  (CARDIZEM) 100 mg in dextrose 5 % 100 mL infusion  5-15 mg/hr Intravenous Titrated Sueanne Margarita, MD 5 mL/hr at 11/11/13 0400 5 mg/hr at 11/11/13 0400  . fluticasone (FLONASE) 50 MCG/ACT nasal spray 1 spray  1 spray Each Nare Daily PRN Rhonda G Barrett, PA-C      . furosemide (LASIX) injection 80 mg  80 mg Intravenous Q12H Cavon Nicolls, MD   80 mg at 11/11/13 1020  . HYDROcodone-acetaminophen (NORCO/VICODIN) 5-325 MG per tablet 1-2 tablet  1-2 tablet Oral Q6H PRN Rhonda G Barrett, PA-C      . irbesartan (AVAPRO) tablet 150 mg  150 mg Oral Daily Rhonda G Barrett, PA-C   150 mg at 11/11/13 1019  . levalbuterol (XOPENEX) nebulizer solution 0.63 mg  0.63 mg Nebulization Q6H PRN Lendon Colonel, NP      . multivitamin with minerals tablet 1 tablet  1 tablet Oral Daily Rhonda G Barrett, PA-C   1 tablet at 11/11/13 1019  . nitroGLYCERIN (NITROSTAT) SL tablet 0.4 mg  0.4 mg Sublingual Q5 Min x 3 PRN Rhonda G Barrett, PA-C      . nitroGLYCERIN 0.2 mg/mL in dextrose 5 % infusion  5 mcg/min Intravenous Titrated Shenita Trego, MD   5 mcg/min at 11/10/13 2300  . ondansetron (ZOFRAN) injection 4 mg  4 mg Intravenous Q6H  PRN Rhonda G Barrett, PA-C      . Rivaroxaban (XARELTO) tablet 20 mg  20 mg Oral Q supper Benjamin G Mancheril, RPH   20 mg at 11/10/13 1648  . sodium chloride 0.9 % injection 3 mL  3 mL Intravenous Q12H Rhonda G Barrett, PA-C   3 mL at 11/10/13 2242  . sodium chloride 0.9 % injection 3 mL  3 mL Intravenous PRN Rhonda G Barrett, PA-C   3 mL at 11/10/13 0916  . zolpidem (AMBIEN) tablet 5 mg  5 mg Oral QHS PRN Rhonda G Barrett, PA-C   5 mg at 11/10/13 2235    Physical Exam: General appearance: alert, cooperative and no distress Neck: JVD - 6 cm above sternal notch, no adenopathy, no carotid bruit, supple, symmetrical, trachea midline and thyroid not enlarged, symmetric, no tenderness/mass/nodules Lungs: clear to auscultation bilaterally Heart: regular rate and rhythm, S1, S2 normal, S3  present and no rub Abdomen: soft, non-tender; bowel sounds normal; no masses,  no organomegaly Extremities: edema 1+ ankles Pulses: 2+ and symmetric Skin: Skin color, texture, turgor normal. No rashes or lesions Neurologic: Alert and oriented X 3, normal strength and tone. Normal symmetric reflexes. Normal coordination and gait  Lab Results: Results for orders placed during the hospital encounter of 11/08/13 (from the past 48 hour(s))  CBC     Status: Abnormal   Collection Time    11/10/13  3:45 AM      Result Value Range   WBC 12.9 (*) 4.0 - 10.5 K/uL   RBC 3.89  3.87 - 5.11 MIL/uL   Hemoglobin 11.8 (*) 12.0 - 15.0 g/dL   HCT 34.8 (*) 36.0 - 46.0 %   MCV 89.5  78.0 - 100.0 fL   MCH 30.3  26.0 - 34.0 pg   MCHC 33.9  30.0 - 36.0 g/dL   RDW 14.7  11.5 - 15.5 %   Platelets 371  150 - 400 K/uL  POCT I-STAT 3, BLOOD GAS (G3+)     Status: Abnormal   Collection Time    11/10/13 12:24 PM      Result Value Range   pH, Arterial 7.473 (*) 7.350 - 7.450   pCO2 arterial 27.8 (*) 35.0 - 45.0 mmHg   pO2, Arterial 56.0 (*) 80.0 - 100.0 mmHg   Bicarbonate 20.3  20.0 - 24.0 mEq/L   TCO2 21  0 - 100 mmol/L   O2 Saturation 91.0     Acid-base deficit 2.0  0.0 - 2.0 mmol/L   Patient temperature 98.8 F     Collection site RADIAL, ALLEN'S TEST ACCEPTABLE     Drawn by Operator     Sample type ARTERIAL    BASIC METABOLIC PANEL     Status: Abnormal   Collection Time    11/11/13  1:20 AM      Result Value Range   Sodium 131 (*) 137 - 147 mEq/L   Comment: DELTA CHECK NOTED   Potassium 3.0 (*) 3.7 - 5.3 mEq/L   Chloride 92 (*) 96 - 112 mEq/L   Comment: DELTA CHECK NOTED   CO2 23  19 - 32 mEq/L   Glucose, Bld 155 (*) 70 - 99 mg/dL   BUN 21  6 - 23 mg/dL   Creatinine, Ser 0.63  0.50 - 1.10 mg/dL   Calcium 8.9  8.4 - 10.5 mg/dL   GFR calc non Af Amer >90  >90 mL/min   GFR calc Af Amer >90  >90 mL/min   Comment: (  NOTE)     The eGFR has been calculated using the CKD EPI equation.     This  calculation has not been validated in all clinical situations.     eGFR's persistently <90 mL/min signify possible Chronic Kidney     Disease.  CBC     Status: Abnormal   Collection Time    11/11/13  1:20 AM      Result Value Range   WBC 12.3 (*) 4.0 - 10.5 K/uL   RBC 3.69 (*) 3.87 - 5.11 MIL/uL   Hemoglobin 11.2 (*) 12.0 - 15.0 g/dL   HCT 32.3 (*) 36.0 - 46.0 %   MCV 87.5  78.0 - 100.0 fL   MCH 30.4  26.0 - 34.0 pg   MCHC 34.7  30.0 - 36.0 g/dL   RDW 14.6  11.5 - 15.5 %   Platelets 358  150 - 400 K/uL    Imaging: Imaging results have been reviewed and Dg Chest Port 1 View  11/10/2013   CLINICAL DATA:  History of breast malignancy and COPD and tobacco use  EXAM: PORTABLE CHEST - 1 VIEW  COMPARISON:  Portable chest x-ray of November 08, 2013  FINDINGS: The cardiopericardial silhouette remains enlarged. The pulmonary vascularity remains indistinct. The lungs are better inflated today. The interstitial markings of both lungs are increased but not greatly changed from the previous study. The left hemidiaphragm remains partially obscured.  IMPRESSION: The findings are most compatible with congestive heart failure with pulmonary interstitial edema. I cannot exclude alveolar edema or alveolar pneumonia in the right infrahilar region. When the patient can tolerate the procedure, a PA and lateral chest x-ray would be useful.   Electronically Signed   By: David  Jordan   On: 11/10/2013 15:37    Assessment:  Active Problems:   Coronary artery calcification seen on CAT scan   Atrial fibrillation, rapid   Congestive dilated cardiomyopathy   Acute systolic heart failure   Plan:  1. Differential diagnosis is tachycardia related cardiomyopathy, idiopathic dilated cardiomyopathy complicated by AF or painless ischemic cardiomyopathy (note presence of coronary calcifications on CT chest). 2. Will stop negative inotrope diltiazem. 3. Continue diuretics IV another 24h 4. Start amiodarone - the  likelihood of early AF recurrence after cardioversion is high, given the echo findings. Better chance of durable maintenance of NSR after amiodarone loading. I doubt she will have chemical cardioversion. Hopefully, will use amiodarone relatively short term only (months) 5. She is already receiving ARB, carvedilol and NOAC  Time Spent Directly with Patient:  30 minutes  Length of Stay:  LOS: 3 days    Jonas Goh 11/11/2013, 11:45 AM     

## 2013-11-12 NOTE — Anesthesia Procedure Notes (Signed)
Date/Time: 11/12/2013 10:07 AM Performed by: Carola Frost Pre-anesthesia Checklist: Patient identified, Emergency Drugs available, Suction available, Patient being monitored and Timeout performed Patient Re-evaluated:Patient Re-evaluated prior to inductionOxygen Delivery Method: Ambu bag Preoxygenation: Pre-oxygenation with 100% oxygen Intubation Type: IV induction Ventilation: Mask ventilation without difficulty and Oral airway inserted - appropriate to patient size

## 2013-11-12 NOTE — Progress Notes (Signed)
Patient ID: Cynthia Herrera, female   DOB: May 26, 1946, 68 y.o.   MRN: [id number]   SUBJECTIVE: TEE-guided DCCV this morning.  EF 20% on TEE.  Patient initially went into NSR then back into atrial fibrillation soon afterwards.  She is in atrial fibrillation now. +orthopnea.  UOP was not measured yesterday but was not particularly vigorous.   Marland Kitchen atorvastatin  20 mg Oral QHS  . calcium carbonate  1,000 mg of elemental calcium Oral Q breakfast  . carvedilol  6.25 mg Oral BID WC  . cholecalciferol  2,000 Units Oral Daily  . furosemide  80 mg Intravenous Q8H  . irbesartan  150 mg Oral Daily  . multivitamin with minerals  1 tablet Oral Daily  . potassium chloride  40 mEq Oral Once  . rivaroxaban  20 mg Oral Q supper  . sodium chloride  3 mL Intravenous Q12H  . spironolactone  12.5 mg Oral Daily  amiodarone gtt    Filed Vitals:   11/12/13 1030 11/12/13 1040 11/12/13 1050 11/12/13 1100  BP: 86/55 118/54 128/80 103/77  Pulse: 90 130 73 106  Temp: 97.6 F (36.4 C)     TempSrc: Oral     Resp: 23 21 23 23   Height:      Weight:      SpO2: 90% 90% 95% 97%    Intake/Output Summary (Last 24 hours) at 11/12/13 1156 Last data filed at 11/12/13 0700  Gross per 24 hour  Intake  946.5 ml  Output      0 ml  Net  946.5 ml    LABS: Basic Metabolic Panel:  Recent Labs  11/11/13 0120  NA 131*  K 3.0*  CL 92*  CO2 23  GLUCOSE 155*  BUN 21  CREATININE 0.63  CALCIUM 8.9   Liver Function Tests: No results found for this basename: AST, ALT, ALKPHOS, BILITOT, PROT, ALBUMIN,  in the last 72 hours No results found for this basename: LIPASE, AMYLASE,  in the last 72 hours CBC:  Recent Labs  11/11/13 0120 11/12/13 0300  WBC 12.3* 10.4  HGB 11.2* 10.6*  HCT 32.3* 30.4*  MCV 87.5 86.4  PLT 358 353   Cardiac Enzymes: No results found for this basename: CKTOTAL, CKMB, CKMBINDEX, TROPONINI,  in the last 72 hours BNP: No components found with this basename: POCBNP,  D-Dimer: No  results found for this basename: DDIMER,  in the last 72 hours Hemoglobin A1C: No results found for this basename: HGBA1C,  in the last 72 hours Fasting Lipid Panel: No results found for this basename: CHOL, HDL, LDLCALC, TRIG, CHOLHDL, LDLDIRECT,  in the last 72 hours Thyroid Function Tests: No results found for this basename: TSH, T4TOTAL, FREET3, T3FREE, THYROIDAB,  in the last 72 hours Anemia Panel: No results found for this basename: VITAMINB12, FOLATE, FERRITIN, TIBC, IRON, RETICCTPCT,  in the last 72 hours  RADIOLOGY: Dg Chest Port 1 View  11/10/2013   CLINICAL DATA:  History of breast malignancy and COPD and tobacco use  EXAM: PORTABLE CHEST - 1 VIEW  COMPARISON:  Portable chest x-ray of November 08, 2013  FINDINGS: The cardiopericardial silhouette remains enlarged. The pulmonary vascularity remains indistinct. The lungs are better inflated today. The interstitial markings of both lungs are increased but not greatly changed from the previous study. The left hemidiaphragm remains partially obscured.  IMPRESSION: The findings are most compatible with congestive heart failure with pulmonary interstitial edema. I cannot exclude alveolar edema or alveolar pneumonia in the right infrahilar  region. When the patient can tolerate the procedure, a PA and lateral chest x-ray would be useful.   Electronically Signed   By: David  Jordan   On: 11/10/2013 15:37   Dg Chest Port 1 View  11/08/2013   CLINICAL DATA:  Shortness of breath, tachycardia  EXAM: PORTABLE CHEST - 1 VIEW  COMPARISON:  10/12/2013  FINDINGS: Cardiomegaly evident with central vascular and interstitial prominence compatible with early edema. Basilar atelectasis noted. No large effusion or pneumothorax. Atherosclerosis of the aorta. Trachea is midline.  IMPRESSION: Cardiomegaly with mild central hilar developing edema.   Electronically Signed   By: Trevor  Shick M.D.   On: 11/08/2013 14:12    PHYSICAL EXAM General: NAD Neck: JVP 9-10 cm,  no thyromegaly or thyroid nodule.  Lungs: Crackles at bases bilaterally.  CV: Nondisplaced PMI.  Heart mildly tachy, irregular S1/S2, no S3/S4, 1/6 HSM apex.  No peripheral edema.   Abdomen: Soft, nontender, no hepatosplenomegaly, no distention.  Neurologic: Alert and oriented x 3.  Psych: Normal affect. Extremities: No clubbing or cyanosis.   TELEMETRY: Reviewed telemetry pt in atrial fibrillation, HR 100s  ASSESSMENT AND PLAN: 67 yo presented with atrial fibrillation/RVR and acute systolic CHF.  1. Atrial fibrillation: Patient was started on amiodarone gtt yesterday afternoon.  TEE-guided DCCV today failed (converted, then went back in atrial fibrillation).  Continue amiodarone gtt, will give 2 more days for amiodarone loading and will re-attempt DCCV (no TEE needed) on Wednesday.  Continue Xarelto and Coreg as well.  2. Acute systolic CHF: EF 20%, diffuse hypokinesis.  ?etiology: tachy-mediated versus pre-existing nonischemic dilated cardiomyopathy versus ischemic cardiomyopathy (coronary calcification on CT).  She remains volume overloaded, UOP not measured yesterday.  No labs today.  - Strict I/Os - BMET now and will give Kdur 40 - Lasix 80 mg IV every 8 hrs - Add spironolactone 12.5 mg daily - Continue current Coreg - Will add ACEI if BP room tomorrow.  - Will re-attempt DCCV Wednesday.   - Needs eventual ischemic workup.  Would favor anticoagulation x 1 month post-DCCV then hold anticoagulation for LHC/RHC.   Dalton McLean 11/12/2013 12:01 PM  

## 2013-11-12 NOTE — Progress Notes (Signed)
CSW went to pt's room to check on her, and pt's daughter Arby Barrette was at bedside. Pt was sleeping heavily when CSW entered, and CSW and Arby Barrette spoke briefly about pt's procedure she had this morning. Arby Barrette explained that pt's heart was shocked and that they are repeating the procedure on Wednesday. CSW was just wanting to see how pt was doing after her procedure, and Arby Barrette thanked CSW and said pt spoke highly of CSW. Continuing to follow pt and provide support to pt and family while she is on my unit.  Ky Barban, MSW, [facility] Clinical Social Worker [phone]

## 2013-11-12 NOTE — Anesthesia Postprocedure Evaluation (Signed)
Anesthesia Post Note  Patient: Cynthia Herrera  Procedure(s) Performed: Procedure(s) (LRB): CARDIOVERSION (N/A)  Anesthesia type: MAC  Patient location: PACU  Post pain: Pain level controlled and Adequate analgesia  Post assessment: Post-op Vital signs reviewed, Patient's Cardiovascular Status Stable and Respiratory Function Stable  Last Vitals:  Filed Vitals:   11/12/13 1200  BP: 89/73  Pulse:   Temp: 36.4 C  Resp:     Post vital signs: Reviewed and stable  Level of consciousness: awake, alert  and oriented  Complications: No apparent anesthesia complications

## 2013-11-12 NOTE — Progress Notes (Signed)
  Echocardiogram Echocardiogram Transesophageal has been performed.  Philipp Deputy 11/12/2013, 10:49 AM

## 2013-11-12 NOTE — CV Procedure (Signed)
      Transesophageal Echocardiogram Note  Garry Bochicchio [id number] 20-Jun-1946  Procedure: Transesophageal Echocardiogram Indications: atrial fib, acute on chronic  Systolic CHF  Procedure Details Consent: Obtained Time Out: Verified patient identification, verified procedure, site/side was marked, verified correct patient position, special equipment/implants available, Radiology Safety Procedures followed,  medications/allergies/relevent history reviewed, required imaging and test results available.  Performed  Medications: Fentanyl: 25 mcg iv Versed: 2 mv iv  Left Ventrical:  Severely depressed LV systolic function.  EF 20%  Mitral Valve: moderate MR  Aortic Valve: normal AV  Tricuspid Valve: mild - mod TR  Pulmonic Valve: trace PI  Left Atrium/ Left atrial appendage: extensive spontansous contrast ("smoke"), no thrombi in the LAA  Atrial septum: intact  Aorta: mild calcification,     Complications: No apparent complications Patient did tolerate procedure well.   Cardioversion Note  Delayni Streed [id number] 1946-02-13  Procedure: DC Cardioversion Indications: atrial fib  Procedure Details Consent: Obtained Time Out: Verified patient identification, verified procedure, site/side was marked, verified correct patient position, special equipment/implants available, Radiology Safety Procedures followed,  medications/allergies/relevent history reviewed, required imaging and test results available.  Performed  The patient has been on adequate anticoagulation.  The patient received IV Lidocaine 60 mg and  Propofol 50  for sedation.  Synchronous cardioversion was performed at 120 joules.  The cardioversion was successful.     Complications: No apparent complications Patient did tolerate procedure well.   Thayer Headings, Brooke Bonito., MD, [facility] 11/12/2013, 10:03 AM

## 2013-11-13 ENCOUNTER — Encounter (HOSPITAL_COMMUNITY): Payer: Self-pay | Admitting: Cardiovascular Disease

## 2013-11-13 LAB — BASIC METABOLIC PANEL
BUN: 22 mg/dL (ref 6–23)
CALCIUM: 9.1 mg/dL (ref 8.4–10.5)
CO2: 24 meq/L (ref 19–32)
Chloride: 92 mEq/L — ABNORMAL LOW (ref 96–112)
Creatinine, Ser: 0.68 mg/dL (ref 0.50–1.10)
GFR calc Af Amer: >90 mL/min (ref >90)
GFR calc non Af Amer: 88 mL/min — ABNORMAL LOW (ref >90)
GLUCOSE: 176 mg/dL — AB (ref 70–99)
Potassium: 3.6 mEq/L — ABNORMAL LOW (ref 3.7–5.3)
Sodium: 131 mEq/L — ABNORMAL LOW (ref 137–147)

## 2013-11-13 LAB — CBC
HEMATOCRIT: 32.7 % — AB (ref 36.0–46.0)
HEMOGLOBIN: 11.2 g/dL — AB (ref 12.0–15.0)
MCH: 29.9 pg (ref 26.0–34.0)
MCHC: 34.3 g/dL (ref 30.0–36.0)
MCV: 87.4 fL (ref 78.0–100.0)
Platelets: 437 10*3/uL — ABNORMAL HIGH (ref 150–400)
RBC: 3.74 MIL/uL — ABNORMAL LOW (ref 3.87–5.11)
RDW: 14.2 % (ref 11.5–15.5)
WBC: 11.7 10*3/uL — ABNORMAL HIGH (ref 4.0–10.5)

## 2013-11-13 MED ORDER — LOSARTAN POTASSIUM 25 MG PO TABS
25.0000 mg | ORAL_TABLET | Freq: Two times a day (BID) | ORAL | Status: DC
  Administered 2013-11-13 – 2013-11-15 (×6): 25 mg via ORAL
  Filled 2013-11-13 (×8): qty 1

## 2013-11-13 MED ORDER — POTASSIUM CHLORIDE CRYS ER 20 MEQ PO TBCR
40.0000 meq | EXTENDED_RELEASE_TABLET | Freq: Once | ORAL | Status: DC
  Filled 2013-11-13: qty 2

## 2013-11-13 MED ORDER — SPIRONOLACTONE 25 MG PO TABS
25.0000 mg | ORAL_TABLET | Freq: Every day | ORAL | Status: DC
  Administered 2013-11-14 – 2013-11-15 (×2): 25 mg via ORAL
  Filled 2013-11-13 (×3): qty 1

## 2013-11-13 MED ORDER — SODIUM CHLORIDE 0.9 % IJ SOLN
3.0000 mL | Freq: Two times a day (BID) | INTRAMUSCULAR | Status: DC
  Administered 2013-11-13 – 2013-11-16 (×5): 3 mL via INTRAVENOUS

## 2013-11-13 MED ORDER — DIGOXIN 125 MCG PO TABS
0.1250 mg | ORAL_TABLET | Freq: Every day | ORAL | Status: DC
  Administered 2013-11-14 – 2013-11-16 (×3): 0.125 mg via ORAL
  Filled 2013-11-13 (×3): qty 1

## 2013-11-13 MED ORDER — SODIUM CHLORIDE 0.9 % IJ SOLN: 3.0000 mL | INTRAMUSCULAR | Status: DC | PRN

## 2013-11-13 MED ORDER — SODIUM CHLORIDE 0.9 % IV SOLN: 250.0000 mL | INTRAVENOUS | Status: DC

## 2013-11-13 MED ORDER — DIGOXIN 250 MCG PO TABS
0.2500 mg | ORAL_TABLET | ORAL | Status: AC
  Administered 2013-11-13: 0.25 mg via ORAL
  Filled 2013-11-13: qty 1

## 2013-11-13 NOTE — Progress Notes (Signed)
Patient ID: Cynthia Herrera, female   DOB: 10/11/1946, 68 y.o.   MRN: [id number]   SUBJECTIVE: Patient remains in atrial fibrillation, HR in 100s.  She diuresed well yesterday. Breathing better.    Marland Kitchen atorvastatin  20 mg Oral QHS  . calcium carbonate  1,000 mg of elemental calcium Oral Q breakfast  . carvedilol  6.25 mg Oral BID WC  . cholecalciferol  2,000 Units Oral Daily  . [START ON 11/14/2013] digoxin  0.125 mg Oral Daily  . digoxin  0.25 mg Oral NOW  . furosemide  80 mg Intravenous Q8H  . losartan  25 mg Oral BID  . multivitamin with minerals  1 tablet Oral Daily  . potassium chloride  40 mEq Oral Once  . rivaroxaban  20 mg Oral Q supper  . sodium chloride  3 mL Intravenous Q12H  . sodium chloride  3 mL Intravenous Q12H  . [START ON 11/14/2013] spironolactone  25 mg Oral Daily  amiodarone gtt    Filed Vitals:   11/13/13 0500 11/13/13 0600 11/13/13 0700 11/13/13 0821  BP:    127/62  Pulse: 42 119 93   Temp:    98 F (36.7 C)  TempSrc:    Oral  Resp:      Height:      Weight:      SpO2: 94% 98% 95% 96%    Intake/Output Summary (Last 24 hours) at 11/13/13 0946 Last data filed at 11/13/13 0700  Gross per 24 hour  Intake 1237.4 ml  Output   4251 ml  Net -3013.6 ml    LABS: Basic Metabolic Panel:  Recent Labs  11/12/13 1304 11/13/13 0305  NA 128* 131*  K 4.2 3.6*  CL 86* 92*  CO2 22 24  GLUCOSE 343* 176*  BUN 19 22  CREATININE 0.55 0.68  CALCIUM 8.6 9.1   Liver Function Tests: No results found for this basename: AST, ALT, ALKPHOS, BILITOT, PROT, ALBUMIN,  in the last 72 hours No results found for this basename: LIPASE, AMYLASE,  in the last 72 hours CBC:  Recent Labs  11/12/13 0300 11/13/13 0305  WBC 10.4 11.7*  HGB 10.6* 11.2*  HCT 30.4* 32.7*  MCV 86.4 87.4  PLT 353 437*   Cardiac Enzymes: No results found for this basename: CKTOTAL, CKMB, CKMBINDEX, TROPONINI,  in the last 72 hours BNP: No components found with this basename: POCBNP,    D-Dimer: No results found for this basename: DDIMER,  in the last 72 hours Hemoglobin A1C: No results found for this basename: HGBA1C,  in the last 72 hours Fasting Lipid Panel: No results found for this basename: CHOL, HDL, LDLCALC, TRIG, CHOLHDL, LDLDIRECT,  in the last 72 hours Thyroid Function Tests: No results found for this basename: TSH, T4TOTAL, FREET3, T3FREE, THYROIDAB,  in the last 72 hours Anemia Panel: No results found for this basename: VITAMINB12, FOLATE, FERRITIN, TIBC, IRON, RETICCTPCT,  in the last 72 hours  RADIOLOGY: Dg Chest Port 1 View  11/10/2013   CLINICAL DATA:  History of breast malignancy and COPD and tobacco use  EXAM: PORTABLE CHEST - 1 VIEW  COMPARISON:  Portable chest x-ray of November 08, 2013  FINDINGS: The cardiopericardial silhouette remains enlarged. The pulmonary vascularity remains indistinct. The lungs are better inflated today. The interstitial markings of both lungs are increased but not greatly changed from the previous study. The left hemidiaphragm remains partially obscured.  IMPRESSION: The findings are most compatible with congestive heart failure with pulmonary interstitial edema.  I cannot exclude alveolar edema or alveolar pneumonia in the right infrahilar region. When the patient can tolerate the procedure, a PA and lateral chest x-ray would be useful.   Electronically Signed   By: David  Martinique   On: 11/10/2013 15:37   Dg Chest Port 1 View  11/08/2013   CLINICAL DATA:  Shortness of breath, tachycardia  EXAM: PORTABLE CHEST - 1 VIEW  COMPARISON:  10/12/2013  FINDINGS: Cardiomegaly evident with central vascular and interstitial prominence compatible with early edema. Basilar atelectasis noted. No large effusion or pneumothorax. Atherosclerosis of the aorta. Trachea is midline.  IMPRESSION: Cardiomegaly with mild central hilar developing edema.   Electronically Signed   By: Daryll Brod M.D.   On: 11/08/2013 14:12    PHYSICAL EXAM General:  NAD Neck: JVP 10-12 cm, no thyromegaly or thyroid nodule.  Lungs: Crackles at bases bilaterally.  CV: Nondisplaced PMI.  Heart mildly tachy, irregular S1/S2, no S3/S4, 1/6 HSM apex.  No peripheral edema.   Abdomen: Soft, nontender, no hepatosplenomegaly, no distention.  Neurologic: Alert and oriented x 3.  Psych: Normal affect. Extremities: No clubbing or cyanosis.   TELEMETRY: Reviewed telemetry pt in atrial fibrillation, HR 100s  ASSESSMENT AND PLAN: 68 yo presented with atrial fibrillation/RVR and acute systolic CHF.  1. Atrial fibrillation: Patient was started on amiodarone gtt Sunday afternoon.  TEE-guided DCCV Monday failed (converted, then went back in atrial fibrillation).   - Continue amiodarone gtt, will give 2 more days for amiodarone loading and will re-attempt DCCV (no TEE needed) on Wednesday.   - Continue Xarelto and Coreg. - Will add digoxin to help with rate control.   2. Acute systolic CHF: EF 95%, diffuse hypokinesis.  ?etiology: tachy-mediated versus pre-existing nonischemic dilated cardiomyopathy versus ischemic cardiomyopathy (coronary calcification on CT).  She remains volume overloaded but diuresed well yesterday.  Renal function stable.  - Replace K. - Lasix 80 mg IV every 8 hrs to continue. - Increase spironolactone to 25 mg daily.  - Continue current Coreg - Would use losartan 25 mg bid rather than irbesartan.  - Will re-attempt DCCV Wednesday as above.   - Needs eventual ischemic workup.  Would favor anticoagulation x 1 month post-DCCV then hold anticoagulation for LHC/RHC.   Loralie Champagne 11/13/2013 9:46 AM

## 2013-11-13 NOTE — Progress Notes (Signed)
Inpatient Diabetes Program Recommendations  AACE/ADA: New Consensus Statement on Inpatient Glycemic Control (2013)  Target Ranges:  Prepandial:   less than 140 mg/dL      Peak postprandial:   less than 180 mg/dL (1-2 hours)      Critically ill patients:  140 - 180 mg/dL  Results for VANESA, RENIER (MRN [id number]) as of 11/13/2013 08:47  Ref. Range 11/11/2013 01:20 11/12/2013 13:04 11/13/2013 03:05  Glucose Latest Range: 70-99 mg/dL 155 (H) 343 (H) 176 (H)    Inpatient Diabetes Program Recommendations Correction (SSI): start sensitive scale TID if needed HgbA1C: =6.5  Diet: add carbohydrate modified to current heart healthy diet  Note: A1C=6.5  MD, is this new-onset DM?  Please address.  Please monitor CBGs achs. Thank you  Raoul Pitch BSN, RN,CDE Inpatient Diabetes Coordinator [phone] (team pager)

## 2013-11-13 NOTE — Progress Notes (Signed)
CSW went to pt's room and asked how her procedure went yesterday. Pt says she spoke with MD and they are going to repeat the cardioversion tomorrow and she is anxious about the results. Pt states she is eager to get out of the hospital, but she realizes she needs to be here. Pt explained she has had a difficult time sleeping at night, and that she is trying not to nap during the day so she can get her sleep cycle back on track. Pt thanked CSW for coming to see her yesterday, saying her daughter told her that pt came to visit while she was asleep. Pt asked CSW to come back later today, as pt had to use the bathroom and was preparing for aide to give her a bath.   Ky Barban, MSW, [facility] Clinical Social Worker [phone]

## 2013-11-13 NOTE — Progress Notes (Signed)
Bedside cardioversion scheduled for 0830 11/14/2013

## 2013-11-14 ENCOUNTER — Encounter (HOSPITAL_COMMUNITY): Payer: Self-pay

## 2013-11-14 ENCOUNTER — Encounter (HOSPITAL_COMMUNITY): Payer: Medicare Other | Admitting: Anesthesiology

## 2013-11-14 ENCOUNTER — Encounter (HOSPITAL_COMMUNITY)
Admission: EM | Disposition: A | Discharge status: Home / Self Care | Payer: Self-pay | Source: Home / Self Care | Attending: Cardiology

## 2013-11-14 ENCOUNTER — Encounter (HOSPITAL_COMMUNITY): Payer: Self-pay | Admitting: Certified Registered"

## 2013-11-14 ENCOUNTER — Inpatient Hospital Stay (HOSPITAL_COMMUNITY): Payer: Medicare Other | Admitting: Anesthesiology

## 2013-11-14 DIAGNOSIS — I4891 Unspecified atrial fibrillation: Secondary | ICD-10-CM

## 2013-11-14 HISTORY — PX: CARDIOVERSION: SHX1299

## 2013-11-14 LAB — BASIC METABOLIC PANEL
BUN: 18 mg/dL (ref 6–23)
CO2: 28 mEq/L (ref 19–32)
Calcium: 8.9 mg/dL (ref 8.4–10.5)
Chloride: 89 mEq/L — ABNORMAL LOW (ref 96–112)
Creatinine, Ser: 0.6 mg/dL (ref 0.50–1.10)
Glucose, Bld: 203 mg/dL — ABNORMAL HIGH (ref 70–99)
POTASSIUM: 3.7 meq/L (ref 3.7–5.3)
Sodium: 133 mEq/L — ABNORMAL LOW (ref 137–147)

## 2013-11-14 LAB — CBC
HCT: 34.5 % — ABNORMAL LOW (ref 36.0–46.0)
Hemoglobin: 11.8 g/dL — ABNORMAL LOW (ref 12.0–15.0)
MCH: 30.3 pg (ref 26.0–34.0)
MCHC: 34.2 g/dL (ref 30.0–36.0)
MCV: 88.7 fL (ref 78.0–100.0)
Platelets: 438 10*3/uL — ABNORMAL HIGH (ref 150–400)
RBC: 3.89 MIL/uL (ref 3.87–5.11)
RDW: 14.2 % (ref 11.5–15.5)
WBC: 12 10*3/uL — ABNORMAL HIGH (ref 4.0–10.5)

## 2013-11-14 SURGERY — CARDIOVERSION
Anesthesia: General

## 2013-11-14 MED ORDER — LIDOCAINE HCL (CARDIAC) 20 MG/ML IV SOLN
INTRAVENOUS | Status: DC | PRN
  Administered 2013-11-14: 50 mg via INTRAVENOUS

## 2013-11-14 MED ORDER — BUPIVACAINE LIPOSOME 1.3 % IJ SUSP
20.0000 mL | INTRAMUSCULAR | Status: DC
  Filled 2013-11-14: qty 20

## 2013-11-14 MED ORDER — METOPROLOL SUCCINATE ER 50 MG PO TB24
50.0000 mg | ORAL_TABLET | Freq: Two times a day (BID) | ORAL | Status: DC
Start: 2013-11-14 — End: 2013-11-16
  Administered 2013-11-14 – 2013-11-16 (×5): 50 mg via ORAL
  Filled 2013-11-14 (×7): qty 1

## 2013-11-14 MED ORDER — PROPOFOL 10 MG/ML IV BOLUS
INTRAVENOUS | Status: DC | PRN
  Administered 2013-11-14: 80 mg via INTRAVENOUS

## 2013-11-14 MED ORDER — POTASSIUM CHLORIDE CRYS ER 20 MEQ PO TBCR
40.0000 meq | EXTENDED_RELEASE_TABLET | Freq: Once | ORAL | Status: AC
  Administered 2013-11-14: 40 meq via ORAL
  Filled 2013-11-14: qty 2

## 2013-11-14 MED ORDER — LIDOCAINE HCL (CARDIAC) 20 MG/ML IV SOLN
INTRAVENOUS | Status: AC
  Filled 2013-11-14: qty 5

## 2013-11-14 MED ORDER — PROPOFOL 10 MG/ML IV BOLUS
INTRAVENOUS | Status: AC
  Filled 2013-11-14: qty 20

## 2013-11-14 NOTE — Anesthesia Postprocedure Evaluation (Signed)
  Anesthesia Post-op Note  Patient: Cynthia Herrera  Procedure(s) Performed: Procedure(s): CARDIOVERSION (BEDSIDE) (N/A)  Patient Location: Nursing Unit  Anesthesia Type:MAC  Level of Consciousness: awake  Airway and Oxygen Therapy: Patient Spontanous Breathing and Patient connected to nasal cannula oxygen  Post-op Pain: none  Post-op Assessment: Post-op Vital signs reviewed  Post-op Vital Signs: Reviewed and stable  Complications: No apparent anesthesia complications

## 2013-11-14 NOTE — Interval H&P Note (Signed)
History and Physical Interval Note:  11/14/2013 8:52 AM  Cynthia Herrera  has presented today for surgery, with the diagnosis of AFIB  The various methods of treatment have been discussed with the patient and family. After consideration of risks, benefits and other options for treatment, the patient has consented to  Procedure(s): CARDIOVERSION (BEDSIDE) (N/A) as a surgical intervention .  The patient's history has been reviewed, patient examined, no change in status, stable for surgery.  I have reviewed the patient's chart and labs.  Questions were answered to the patient's satisfaction.     Cynthia Herrera [employer]

## 2013-11-14 NOTE — Progress Notes (Signed)
Patient ID: Cynthia Herrera, female   DOB: 1946-10-21, 68 y.o.   MRN: [id number]   SUBJECTIVE: Patient remains in atrial fibrillation, HR in 100s.  She diuresed well yesterday. Breathing better.  We attempted cardioversion x 3 today with 200 J.  Each time, she held NSR for a brief period and went back to atrial fibrillation.   Marland Kitchen atorvastatin  20 mg Oral QHS  . calcium carbonate  1,000 mg of elemental calcium Oral Q breakfast  . cholecalciferol  2,000 Units Oral Daily  . digoxin  0.125 mg Oral Daily  . furosemide  80 mg Intravenous Q8H  . losartan  25 mg Oral BID  . metoprolol succinate  50 mg Oral BID  . multivitamin with minerals  1 tablet Oral Daily  . potassium chloride  40 mEq Oral Once  . potassium chloride  40 mEq Oral Once  . rivaroxaban  20 mg Oral Q supper  . sodium chloride  3 mL Intravenous Q12H  . sodium chloride  3 mL Intravenous Q12H  . spironolactone  25 mg Oral Daily  amiodarone gtt    Filed Vitals:   11/13/13 2354 11/14/13 0400 11/14/13 0500 11/14/13 0808  BP: 128/75 154/82  123/57  Pulse:    62  Temp: 97.6 F (36.4 C)   97.7 F (36.5 C)  TempSrc: Oral   Oral  Resp: 19 18  20   Height:      Weight:   111.9 kg (246 lb 11.1 oz)   SpO2: 98% 97%  98%    Intake/Output Summary (Last 24 hours) at 11/14/13 0853 Last data filed at 11/14/13 0700  Gross per 24 hour  Intake  994.1 ml  Output   5075 ml  Net -4080.9 ml    LABS: Basic Metabolic Panel:  Recent Labs  11/13/13 0305 11/14/13 0340  NA 131* 133*  K 3.6* 3.7  CL 92* 89*  CO2 24 28  GLUCOSE 176* 203*  BUN 22 18  CREATININE 0.68 0.60  CALCIUM 9.1 8.9   Liver Function Tests: No results found for this basename: AST, ALT, ALKPHOS, BILITOT, PROT, ALBUMIN,  in the last 72 hours No results found for this basename: LIPASE, AMYLASE,  in the last 72 hours CBC:  Recent Labs  11/13/13 0305 11/14/13 0340  WBC 11.7* 12.0*  HGB 11.2* 11.8*  HCT 32.7* 34.5*  MCV 87.4 88.7  PLT 437* 438*    Cardiac Enzymes: No results found for this basename: CKTOTAL, CKMB, CKMBINDEX, TROPONINI,  in the last 72 hours BNP: No components found with this basename: POCBNP,  D-Dimer: No results found for this basename: DDIMER,  in the last 72 hours Hemoglobin A1C: No results found for this basename: HGBA1C,  in the last 72 hours Fasting Lipid Panel: No results found for this basename: CHOL, HDL, LDLCALC, TRIG, CHOLHDL, LDLDIRECT,  in the last 72 hours Thyroid Function Tests: No results found for this basename: TSH, T4TOTAL, FREET3, T3FREE, THYROIDAB,  in the last 72 hours Anemia Panel: No results found for this basename: VITAMINB12, FOLATE, FERRITIN, TIBC, IRON, RETICCTPCT,  in the last 72 hours  RADIOLOGY: Dg Chest Port 1 View  11/10/2013   CLINICAL DATA:  History of breast malignancy and COPD and tobacco use  EXAM: PORTABLE CHEST - 1 VIEW  COMPARISON:  Portable chest x-ray of November 08, 2013  FINDINGS: The cardiopericardial silhouette remains enlarged. The pulmonary vascularity remains indistinct. The lungs are better inflated today. The interstitial markings of both lungs are increased but  not greatly changed from the previous study. The left hemidiaphragm remains partially obscured.  IMPRESSION: The findings are most compatible with congestive heart failure with pulmonary interstitial edema. I cannot exclude alveolar edema or alveolar pneumonia in the right infrahilar region. When the patient can tolerate the procedure, a PA and lateral chest x-ray would be useful.   Electronically Signed   By: David  Martinique   On: 11/10/2013 15:37   Dg Chest Port 1 View  11/08/2013   CLINICAL DATA:  Shortness of breath, tachycardia  EXAM: PORTABLE CHEST - 1 VIEW  COMPARISON:  10/12/2013  FINDINGS: Cardiomegaly evident with central vascular and interstitial prominence compatible with early edema. Basilar atelectasis noted. No large effusion or pneumothorax. Atherosclerosis of the aorta. Trachea is midline.   IMPRESSION: Cardiomegaly with mild central hilar developing edema.   Electronically Signed   By: Daryll Brod M.D.   On: 11/08/2013 14:12    PHYSICAL EXAM General: NAD Neck: JVP 8-9 cm, no thyromegaly or thyroid nodule.  Lungs: Crackles at bases bilaterally.  CV: Nondisplaced PMI.  Heart mildly tachy, irregular S1/S2, no S3/S4, 1/6 HSM apex.  No peripheral edema.   Abdomen: Soft, nontender, no hepatosplenomegaly, no distention.  Neurologic: Alert and oriented x 3.  Psych: Normal affect. Extremities: No clubbing or cyanosis.   TELEMETRY: Reviewed telemetry pt in atrial fibrillation, HR 100s  ASSESSMENT AND PLAN: 68 yo presented with atrial fibrillation/RVR and acute systolic CHF.  1. Atrial fibrillation: Patient was started on amiodarone gtt Sunday afternoon.  TEE-guided DCCV Monday failed (converted, then went back in atrial fibrillation).   DCCV today after further amiodarone loading also failed (held NSR briefly each time).  - Stop Coreg, start Toprol XL for better rate control.  Continue digoxin.  - Continue Xarelto - Will stop amiodarone once I have achieved reasonable rate control on po meds.  - Will talk with EP about options for attaining NSR.  Possible dofetilide initiation down the road when amiodarone is out of her system. Could certainly consider atrial fibrillation ablation.  2. Acute systolic CHF: EF 45%, diffuse hypokinesis.  ?etiology: tachy-mediated versus pre-existing nonischemic dilated cardiomyopathy versus ischemic cardiomyopathy (coronary calcification on CT).  Volume is better and she diuresed well yesterday.  Renal function stable.  - Replace K. - Lasix 80 mg IV every 8 hrs to continue one more day probably. - Continue spironolactone, digoxin, and losartan.   - Changing over to Toprol XL and off Coreg for rate control.  - Would use losartan 25 mg bid rather than irbesartan.  - Needs eventual ischemic workup.  Would favor anticoagulation x 1 month post-DCCV then  hold anticoagulation for LHC/RHC.   Loralie Champagne 11/14/2013 8:53 AM

## 2013-11-14 NOTE — Anesthesia Postprocedure Evaluation (Signed)
  Anesthesia Post-op Note  Patient: Cynthia Herrera  Procedure(s) Performed: Procedure(s): CARDIOVERSION (BEDSIDE) (N/A)  Patient Location: Heart ICU Anesthesia Type:General  Level of Consciousness: awake, alert  and oriented  Airway and Oxygen Therapy: Patient Spontanous Breathing and Patient connected to nasal cannula oxygen  Post-op Pain: none  Post-op Assessment: Post-op Vital signs reviewed  Post-op Vital Signs: Reviewed  Complications: No apparent anesthesia complications

## 2013-11-14 NOTE — Transfer of Care (Signed)
Immediate Anesthesia Transfer of Care Note  Patient: Cynthia Herrera  Procedure(s) Performed: Procedure(s): CARDIOVERSION (BEDSIDE) (N/A)  Patient Location: Nursing Unit  Anesthesia Type:MAC  Level of Consciousness: lethargic and responds to stimulation  Airway & Oxygen Therapy: Patient Spontanous Breathing and Patient connected to nasal cannula oxygen  Post-op Assessment: Report given to PACU RN  Post vital signs: Reviewed and stable  Complications: No apparent anesthesia complications

## 2013-11-14 NOTE — Procedures (Signed)
Electrical Cardioversion Procedure Note Cynthia Herrera [id number] 08/05/1946  Procedure: Electrical Cardioversion Indications:  Atrial Fibrillation  Procedure Details Consent: Risks of procedure as well as the alternatives and risks of each were explained to the (patient/caregiver).  Consent for procedure obtained. Time Out: Verified patient identification, verified procedure, site/side was marked, verified correct patient position, special equipment/implants available, medications/allergies/relevent history reviewed, required imaging and test results available.  Performed  Patient placed on cardiac monitor, pulse oximetry, supplemental oxygen as necessary.  Sedation given: Propofol per anesthesiology Pacer pads placed anterior and posterior chest.  Cardioverted 3 time(s).  Cardioverted at [place].  Evaluation Findings: Post procedure EKG shows: Atrial Fibrillation.  After each cardioversion, patient would be in NSR briefly then atrial fibrillation would recur.  Complications: None Patient did tolerate procedure well.   Loralie Champagne 11/14/2013, 8:57 AM

## 2013-11-14 NOTE — Progress Notes (Signed)
CSW visited pt to see how she is feeling today. Pt states she is physically feeling better but she is not sure about the next step concerning her medical care. Pt waiting for MDs to collaborate to determine if she is going to need invasive procedures. Pt states she is eager to get home. Pt's son Sherren Mocha was also at bedside and participated in conversation with CSW. Pt explained that she has not been sleeping well and that she feels like she is constantly being poked and prodded, or people are constantly coming to draw blood. Pt also explains she is exhausted and tired of being in the hospital. CSW provided support. Pt thanked CSW for continuing to visit her, stating "you are the best thing about this hospitalization." CSW and pt joked that this is because CSW has not been drawing blood and poking and prodding her. CSW will continue to provide support to pt and check on her while she is on CSW's unit.  Ky Barban, MSW, [facility] Clinical Social Worker [phone]

## 2013-11-14 NOTE — H&P (View-Only) (Signed)
Patient ID: Cynthia Herrera, female   DOB: May 26, 1946, 68 y.o.   MRN: [id number]   SUBJECTIVE: TEE-guided DCCV this morning.  EF 20% on TEE.  Patient initially went into NSR then back into atrial fibrillation soon afterwards.  She is in atrial fibrillation now. +orthopnea.  UOP was not measured yesterday but was not particularly vigorous.   Marland Kitchen atorvastatin  20 mg Oral QHS  . calcium carbonate  1,000 mg of elemental calcium Oral Q breakfast  . carvedilol  6.25 mg Oral BID WC  . cholecalciferol  2,000 Units Oral Daily  . furosemide  80 mg Intravenous Q8H  . irbesartan  150 mg Oral Daily  . multivitamin with minerals  1 tablet Oral Daily  . potassium chloride  40 mEq Oral Once  . rivaroxaban  20 mg Oral Q supper  . sodium chloride  3 mL Intravenous Q12H  . spironolactone  12.5 mg Oral Daily  amiodarone gtt    Filed Vitals:   11/12/13 1030 11/12/13 1040 11/12/13 1050 11/12/13 1100  BP: 86/55 118/54 128/80 103/77  Pulse: 90 130 73 106  Temp: 97.6 F (36.4 C)     TempSrc: Oral     Resp: 23 21 23 23   Height:      Weight:      SpO2: 90% 90% 95% 97%    Intake/Output Summary (Last 24 hours) at 11/12/13 1156 Last data filed at 11/12/13 0700  Gross per 24 hour  Intake  946.5 ml  Output      0 ml  Net  946.5 ml    LABS: Basic Metabolic Panel:  Recent Labs  11/11/13 0120  NA 131*  K 3.0*  CL 92*  CO2 23  GLUCOSE 155*  BUN 21  CREATININE 0.63  CALCIUM 8.9   Liver Function Tests: No results found for this basename: AST, ALT, ALKPHOS, BILITOT, PROT, ALBUMIN,  in the last 72 hours No results found for this basename: LIPASE, AMYLASE,  in the last 72 hours CBC:  Recent Labs  11/11/13 0120 11/12/13 0300  WBC 12.3* 10.4  HGB 11.2* 10.6*  HCT 32.3* 30.4*  MCV 87.5 86.4  PLT 358 353   Cardiac Enzymes: No results found for this basename: CKTOTAL, CKMB, CKMBINDEX, TROPONINI,  in the last 72 hours BNP: No components found with this basename: POCBNP,  D-Dimer: No  results found for this basename: DDIMER,  in the last 72 hours Hemoglobin A1C: No results found for this basename: HGBA1C,  in the last 72 hours Fasting Lipid Panel: No results found for this basename: CHOL, HDL, LDLCALC, TRIG, CHOLHDL, LDLDIRECT,  in the last 72 hours Thyroid Function Tests: No results found for this basename: TSH, T4TOTAL, FREET3, T3FREE, THYROIDAB,  in the last 72 hours Anemia Panel: No results found for this basename: VITAMINB12, FOLATE, FERRITIN, TIBC, IRON, RETICCTPCT,  in the last 72 hours  RADIOLOGY: Dg Chest Port 1 View  11/10/2013   CLINICAL DATA:  History of breast malignancy and COPD and tobacco use  EXAM: PORTABLE CHEST - 1 VIEW  COMPARISON:  Portable chest x-ray of November 08, 2013  FINDINGS: The cardiopericardial silhouette remains enlarged. The pulmonary vascularity remains indistinct. The lungs are better inflated today. The interstitial markings of both lungs are increased but not greatly changed from the previous study. The left hemidiaphragm remains partially obscured.  IMPRESSION: The findings are most compatible with congestive heart failure with pulmonary interstitial edema. I cannot exclude alveolar edema or alveolar pneumonia in the right infrahilar  region. When the patient can tolerate the procedure, a PA and lateral chest x-ray would be useful.   Electronically Signed   By: David  Martin[place]   On: 11/10/2013 15:37   Dg Chest Port 1 View  11/08/2013   CLINICAL DATA:  Shortness of breath, tachycardia  EXAM: PORTABLE CHEST - 1 VIEW  COMPARISON:  10/12/2013  FINDINGS: Cardiomegaly evident with central vascular and interstitial prominence compatible with early edema. Basilar atelectasis noted. No large effusion or pneumothorax. Atherosclerosis of the aorta. Trachea is midline.  IMPRESSION: Cardiomegaly with mild central hilar developing edema.   Electronically Signed   By: Daryll Brod M.D.   On: 11/08/2013 14:12    PHYSICAL EXAM General: NAD Neck: JVP 9-10 cm,  no thyromegaly or thyroid nodule.  Lungs: Crackles at bases bilaterally.  CV: Nondisplaced PMI.  Heart mildly tachy, irregular S1/S2, no S3/S4, 1/6 HSM apex.  No peripheral edema.   Abdomen: Soft, nontender, no hepatosplenomegaly, no distention.  Neurologic: Alert and oriented x 3.  Psych: Normal affect. Extremities: No clubbing or cyanosis.   TELEMETRY: Reviewed telemetry pt in atrial fibrillation, HR 100s  ASSESSMENT AND PLAN: 68 yo presented with atrial fibrillation/RVR and acute systolic CHF.  1. Atrial fibrillation: Patient was started on amiodarone gtt yesterday afternoon.  TEE-guided DCCV today failed (converted, then went back in atrial fibrillation).  Continue amiodarone gtt, will give 2 more days for amiodarone loading and will re-attempt DCCV (no TEE needed) on Wednesday.  Continue Xarelto and Coreg as well.  2. Acute systolic CHF: EF 15%, diffuse hypokinesis.  ?etiology: tachy-mediated versus pre-existing nonischemic dilated cardiomyopathy versus ischemic cardiomyopathy (coronary calcification on CT).  She remains volume overloaded, UOP not measured yesterday.  No labs today.  - Strict I/Os - BMET now and will give Kdur 40 - Lasix 80 mg IV every 8 hrs - Add spironolactone 12.5 mg daily - Continue current Coreg - Will add ACEI if BP room tomorrow.  - Will re-attempt DCCV Wednesday.   - Needs eventual ischemic workup.  Would favor anticoagulation x 1 month post-DCCV then hold anticoagulation for LHC/RHC.   Loralie Champagne 11/12/2013 12:01 PM

## 2013-11-14 NOTE — Consult Note (Signed)
Patient ID: Cynthia Herrera MRN: [id number], DOB/AGE: 05-13-1946   Admit date: 11/08/2013 Date of Consult: 11/14/2013  Primary Physician: Burnice Logan, MD  Primary Cardiologist: [name], MD Reason for Consultation: Atrial fibrillation  History of Present Illness Cynthia Herrera is a 68 y.o. female with HTN, dyslipidemia, COPD, breast CA s/p left sided lumpectomy/resection, chemotherapy and XRT in 2002 and spinal stenosis. She was admitted 11/08/2013 with increasing DOE and LE swelling, found to have acute systolic HF and new onset rapid AFib. She was started on IV diltiazem for rate control and Xarelto. An echo was done revealing LVEF 15-20%, diffuse HK, LA dimension 43 mm, mod RV dysfunction, mod MR, mild-mod TR, PASP 54. Coreg, ARB and spironolactone were added. She was diuresed with IV Lasix. Diltiazem was discontinued. Her rates remained elevated and BP low; therefore, IV amiodarone was added. She then underwent TEE-guided DCCV on 11/12/2013 which was successful for restoring SR. However, she reverted back to AFib shortly after. She was continued on amiodarone. Coreg was discontinued in favor of metoprolol succinate. In addition, digoxin was started for rate control. Today she underwent repeat DCCV but developed IRAF. EP consult requested for recommendations regarding further AFib treatment.   Ms. Hamlyn reports increasing DOE and LE swelling x 1 month. She has struggled with back pain since April 2014 which limits her functional status. However, she noticed a significant decline in exercise tolerance since 10/12/2013. She denies CP or palpitations. She reports her SOB has significantly improved since admission although she has not been up ambulating much while here. She denies dizziness, near syncope or syncope. She denies orthopnea or PND.  Past Medical History Past Medical History  Diagnosis Date  . Personal history of colonic polyps     adenomas 03 and 08  . Hyperlipidemia    . Hypertension   . Osteopenia   . Vitamin D deficiency   . Allergy     seasonal  . Arthritis   . Breast cancer     left: surgery,chemo,radiation  . COPD (chronic obstructive pulmonary disease)     Past Surgical History Past Surgical History  Procedure Laterality Date  . Mastectomy  July 2002    left  . Cholecystectomy  2004  . Colonoscopy  2003, September 2008, April 01, 2011    adenomas 03 and 08, polyp 12, diverticulosis  . Joint replacement      totally replacement surgery  . Tubal ligation    . Breast lumpectomy  2002  . Port-a-cath removal    . Total knee arthroplasty  2006  . Tee without cardioversion N/A 11/12/2013    Procedure: TRANSESOPHAGEAL ECHOCARDIOGRAM (TEE);  Surgeon: Thayer Headings, MD;  Location: [facility];  Service: Cardiovascular;  Laterality: N/A;  pt b/p low, pt buccal membranes very dry, lips scapped, pt c/o thirst. NPO since MN and iv FLUIDS TOTAL INFUSING AT TOTAL 20ML HR....  Dr. Cathie Olden order allow NS to bolus during procedure....very dry,NS bolus 250 ml total..pt responding well to meds..  . Cardioversion N/A 11/12/2013    Procedure: CARDIOVERSION;  Surgeon: Thayer Headings, MD;  Location: [facility] ENDOSCOPY;  Service: Cardiovascular;  Laterality: N/A;  10:08  Dr. Marissa Nestle, anesthesia present, L[place]   60mg ,  propofol 50mg , IV for elective cardioversion....Dr. Cathie Olden delievered synch 120 joules with successful cardioversion to NSR  . Cardioversion N/A 11/12/2013    Procedure: CARDIOVERSION;  Surgeon: Thayer Headings, MD;  Location: [facility] ENDOSCOPY;  Service: Cardiovascular;  Laterality: N/A;    Allergies/Intolerances Allergies  Allergen Reactions  . Codeine Phosphate Other (See Comments)    Weird feeling "    Inpatient Medications . atorvastatin  20 mg Oral QHS  . calcium carbonate  1,000 mg of elemental calcium Oral Q breakfast  . cholecalciferol  2,000 Units Oral Daily  . digoxin  0.125 mg Oral Daily  . furosemide  80 mg Intravenous Q8H  . losartan  25  mg Oral BID  . metoprolol succinate  50 mg Oral BID  . multivitamin with minerals  1 tablet Oral Daily  . potassium chloride  40 mEq Oral Once  . potassium chloride  40 mEq Oral Once  . rivaroxaban  20 mg Oral Q supper  . sodium chloride  3 mL Intravenous Q12H  . sodium chloride  3 mL Intravenous Q12H  . spironolactone  25 mg Oral Daily   . sodium chloride 250 mL (11/14/13 0600)  . amiodarone (NEXTERONE PREMIX) 360 mg/200 mL dextrose 30 mg/hr (11/14/13 0107)  . nitroGLYCERIN Stopped (11/10/13 2330)    Family History Family History  Problem Relation Age of Onset  . Dementia Sister   . Diabetes Sister   . Diabetes Brother   . Heart disease Brother   . Liver cancer Mother   . Diabetes Mother   . Cancer Mother     pancreatic  . Diabetes Father   . Heart disease Father      Social History History   Social History  . Marital Status: Widowed    Spouse Name: N/A    Number of Children: N/A  . Years of Education: N/A   Occupational History  . Not on file.   Social History Main Topics  . Smoking status: Former Smoker    Quit date: 08/24/2001  . Smokeless tobacco: Never Used  . Alcohol Use: No  . Drug Use: No  . Sexual Activity: Not on file   Other Topics Concern  . Not on file   Social History Narrative  . No narrative on file     Review of Systems General: No chills, fever, night sweats or weight changes  Cardiovascular:  No chest pain, dyspnea on exertion, edema, orthopnea, palpitations, paroxysmal nocturnal dyspnea Dermatological: No rash, lesions or masses Respiratory: No cough, dyspnea Urologic: No hematuria, dysuria Abdominal: No nausea, vomiting, diarrhea, bright red blood per rectum, melena, or hematemesis Neurologic: No visual changes, weakness, changes in mental status All other systems reviewed and are otherwise negative except as noted above.  Physical Exam Vitals: Blood pressure 123/57, pulse 62, temperature 97.7 F (36.5 C), temperature source  Oral, resp. rate 20, height 5\' 6"  (1.676 m), weight 246 lb 11.1 oz (111.9 kg), SpO2 98.00%.  General: Well developed, well appearing 68 y.o. female in no acute distress. HEENT: Normocephalic, atraumatic. EOMs intact. Sclera nonicteric. Oropharynx clear.  Neck: Supple without bruits. No JVD. Lungs: Respirations regular and unlabored, CTA bilaterally. No wheezes, rales or rhonchi. Heart: Irregular. S1, S2 present. No murmurs, rub, S3 or S4. Abdomen: Soft, non-tender, non-distended. BS present x 4 quadrants. No hepatosplenomegaly.  Extremities: No clubbing, cyanosis or edema. DP/PT/Radials 2+ and equal bilaterally. Psych: Normal affect. Neuro: Alert and oriented X 3. Moves all extremities spontaneously. Musculoskeletal: No kyphosis. Skin: Intact. Warm and dry. No rashes or petechiae in exposed areas.   Labs Lab Results  Component Value Date   WBC 12.0* 11/14/2013   HGB 11.8* 11/14/2013   HCT 34.5* 11/14/2013   MCV 88.7 11/14/2013   PLT 438* 11/14/2013    Recent Labs  Lab 11/09/13 0308  11/14/13 0340  NA 141  < > 133*  K 3.0*  < > 3.7  CL 102  < > 89*  CO2 22  < > 28  BUN 19  < > 18  CREATININE 0.64  < > 0.60  CALCIUM 9.2  < > 8.9  PROT 6.8  --   --   BILITOT 0.6  --   --   ALKPHOS 91  --   --   ALT 31  --   --   AST 25  --   --   GLUCOSE 136*  < > 203*  < > = values in this interval not displayed.  ProBNP 1460 TSH 0.562  Radiology/Studies Dg Chest Port 1 View  11/10/2013   CLINICAL DATA:  History of breast malignancy and COPD and tobacco use  EXAM: PORTABLE CHEST - 1 VIEW  COMPARISON:  Portable chest x-ray of November 08, 2013  FINDINGS: The cardiopericardial silhouette remains enlarged. The pulmonary vascularity remains indistinct. The lungs are better inflated today. The interstitial markings of both lungs are increased but not greatly changed from the previous study. The left hemidiaphragm remains partially obscured.  IMPRESSION: The findings are most compatible with congestive  heart failure with pulmonary interstitial edema. I cannot exclude alveolar edema or alveolar pneumonia in the right infrahilar region. When the patient can tolerate the procedure, a PA and lateral chest x-ray would be useful.   Electronically Signed   By: David  [place]   On: 11/10/2013 15:37   Dg Chest Port 1 View  11/08/2013   CLINICAL DATA:  Shortness of breath, tachycardia  EXAM: PORTABLE CHEST - 1 VIEW  COMPARISON:  10/12/2013  FINDINGS: Cardiomegaly evident with central vascular and interstitial prominence compatible with early edema. Basilar atelectasis noted. No large effusion or pneumothorax. Atherosclerosis of the aorta. Trachea is midline.  IMPRESSION: Cardiomegaly with mild central hilar developing edema.   Electronically Signed   By: Ruel Favors M.D.   On: 11/08/2013 14:12   Echocardiogram  Study Conclusions - Left ventricle: The cavity size was moderately dilated. Wall thickness was normal. Systolic function was severely reduced. The estimated ejection fraction was in the range of 15% to 20%. Diffuse hypokinesis. Doppler parameters are consistent with restrictive physiology, indicative of decreased left ventricular diastolic compliance and/or increased left atrial pressure. Doppler parameters are consistent with elevated mean left atrial filling pressure. - Aortic valve: Trivial regurgitation. - Mitral valve: Moderate regurgitation. - Left atrium: The atrium was moderately dilated. 43 mm. - Right ventricle: Systolic function was moderately reduced. - Right atrium: The atrium was mildly dilated. - Atrial septum: No defect or patent foramen ovale was identified. - Tricuspid valve: Mild-moderate regurgitation directed centrally. - Pulmonary arteries: PA peak pressure: 81mm Hg (S).  12-lead ECG on admission shows atrial fibrillation w/RVR at 146 bpm Telemetry currently in AFib with V rate in 90s  Assessment and Plan  1. Persistent atrial fibrillation - newly diagnosed -  s/p DCCV this admission with IRAF - has been difficult to rate control although V rate in 90s currently on amiodarone, BB and digoxin - in setting of acute systolic HF, new LV dysfunction which may be tachy-mediated and LA dimension of 43, would favor rhythm control approach in hopes of maintaining SR and improving LVEF; may be better to continue amiodarone in short term as Tikosyn initiation would be delayed due to need for amiodarone washout - Dr. Graciela Husbands to advise - continue anticoagulation - may be candidate  for AFib ablation   2. Acute systolic HF in setting of newly diagnosed LV dysfunction - question if tachy-mediated - no anginal symptoms but coronary calcification seen on CT so agree with plans for LHC/RHC once rhythm stable and after anticoagulation x 1 month post DCCV - now on guideline-directed medical therapy   Dr. Caryl Comes to see. Please see recommendations below. Signed, Ileene Hutchinson, PA-C 11/14/2013, 9:16 AM

## 2013-11-14 NOTE — Anesthesia Preprocedure Evaluation (Addendum)
Anesthesia Evaluation  Patient identified by MRN, date of birth, ID band Patient awake    Reviewed: Allergy & Precautions, H&P , NPO status , Patient's Chart, lab work & pertinent test results  Airway Mallampati: I TM Distance: >3 FB Neck ROM: Full    Dental  (+) Teeth Intact, Partial Lower, Partial Upper and Dental Advisory Given   Pulmonary shortness of breath, COPDformer smoker,  breath sounds clear to auscultation        Cardiovascular hypertension, Pt. on medications + CAD, +CHF and + DOE + dysrhythmias Atrial Fibrillation Rhythm:Irregular Rate:Tachycardia     Neuro/Psych    GI/Hepatic   Endo/Other  Morbid obesity  Renal/GU      Musculoskeletal   Abdominal   Peds  Hematology   Anesthesia Other Findings   Reproductive/Obstetrics                          Anesthesia Physical Anesthesia Plan  ASA: III  Anesthesia Plan: General   Post-op Pain Management:    Induction: Intravenous  Airway Management Planned: Mask  Additional Equipment:   Intra-op Plan:   Post-operative Plan:   Informed Consent: I have reviewed the patients History and Physical, chart, labs and discussed the procedure including the risks, benefits and alternatives for the proposed anesthesia with the patient or authorized representative who has indicated his/her understanding and acceptance.   Dental advisory given  Plan Discussed with: CRNA, Anesthesiologist and Surgeon  Anesthesia Plan Comments:         Anesthesia Quick Evaluation

## 2013-11-14 NOTE — Consult Note (Signed)
She presented with shortness Of breath and congestive heart failure dilated cardiomyopathy and atrial fibrillation with a rapid rate unassociated with palpitations. Interestingly at initial presentation one month before she was in sinus rhythm; at that time her ECG was notable for prominent biatrial enlargement. Notably also, a Myoview scan 2009 demonstrated normal LV function with an estimated EF of 49%, although the quality of report suggested that looked better than that.  Investigation of the cardiomyopathy is ongoing. Certainly rate related complications of atrial fibrillation or a likely component although, as noted above, she was in sinus rhythm with first seen in the emergency room. It has not been difficult to convert her, however, she is not maintaining sinus rhythm. Antiarrhythmic options appropriately include dofetilide and amiodarone; as amiodarone is already been initiated and rate control is essential I think it is a reasonable strategy, not withstanding her relatively young age.  I would consider intensely loading her with consideration of repeat cardioversion in 1-2 weeks. Verapamil can be used as adjunctively if she has recurrence of atrial fibrillation(ERAF.)  Ibutilide can also facilitate  Cardioversion with conversion does not occur.  I also concur with Dr. Donalynn Furlong thoughts about catheter ablation of her atrial fibrillation given her surprisingly normal left atrial dimensions by echo, although again qualitatively, left atrium is described as moderately enlarged. There is a possibility that she'll undergo MRI scanning to further understand left atrial geometry.  For now we'll increase amiodarone.  Interestingly there are data to suggest that, quite surprisingly to me and it is my bias, carvedilol was more effective than metoprolol succinate at reducing inappropriate shocks in patients with atrial fibrillation is related to decreasing the frequency of episodes as well as decreasing the rate  of atrial fibrillation itself.

## 2013-11-14 NOTE — Preoperative (Signed)
Beta Blockers   Reason not to administer Beta Blockers:Not Applicable 

## 2013-11-15 LAB — BASIC METABOLIC PANEL
BUN: 19 mg/dL (ref 6–23)
BUN: 18 mg/dL (ref 6–23)
CHLORIDE: 88 meq/L — AB (ref 96–112)
CO2: 28 mEq/L (ref 19–32)
CO2: 28 mEq/L (ref 19–32)
CREATININE: 0.66 mg/dL (ref 0.50–1.10)
Calcium: 9 mg/dL (ref 8.4–10.5)
Calcium: 8.9 mg/dL (ref 8.4–10.5)
Chloride: 92 mEq/L — ABNORMAL LOW (ref 96–112)
Creatinine, Ser: 0.72 mg/dL (ref 0.50–1.10)
GFR calc Af Amer: >90 mL/min (ref >90)
GFR calc Af Amer: >90 mL/min (ref >90)
GFR calc non Af Amer: 89 mL/min — ABNORMAL LOW (ref >90)
GFR, EST NON AFRICAN AMERICAN: 86 mL/min — AB (ref >90)
GLUCOSE: 126 mg/dL — AB (ref 70–99)
Glucose, Bld: 216 mg/dL — ABNORMAL HIGH (ref 70–99)
POTASSIUM: 3.3 meq/L — AB (ref 3.7–5.3)
Potassium: 2.7 mEq/L — CL (ref 3.7–5.3)
SODIUM: 135 meq/L — AB (ref 137–147)
Sodium: 134 mEq/L — ABNORMAL LOW (ref 137–147)

## 2013-11-15 LAB — CBC
HCT: 39.7 % (ref 36.0–46.0)
HEMOGLOBIN: 14 g/dL (ref 12.0–15.0)
MCH: 31 pg (ref 26.0–34.0)
MCHC: 35.3 g/dL (ref 30.0–36.0)
MCV: 88 fL (ref 78.0–100.0)
Platelets: 407 10*3/uL — ABNORMAL HIGH (ref 150–400)
RBC: 4.51 MIL/uL (ref 3.87–5.11)
RDW: 14.3 % (ref 11.5–15.5)
WBC: 9.8 10*3/uL (ref 4.0–10.5)

## 2013-11-15 MED ORDER — POTASSIUM CHLORIDE CRYS ER 20 MEQ PO TBCR
40.0000 meq | EXTENDED_RELEASE_TABLET | Freq: Once | ORAL | Status: AC
  Administered 2013-11-15: 40 meq via ORAL

## 2013-11-15 MED ORDER — AMIODARONE HCL 200 MG PO TABS
400.0000 mg | ORAL_TABLET | Freq: Three times a day (TID) | ORAL | Status: DC
  Administered 2013-11-15 – 2013-11-16 (×3): 400 mg via ORAL
  Filled 2013-11-15 (×6): qty 2

## 2013-11-15 MED ORDER — POTASSIUM CHLORIDE CRYS ER 20 MEQ PO TBCR
40.0000 meq | EXTENDED_RELEASE_TABLET | ORAL | Status: AC
  Administered 2013-11-15: 40 meq via ORAL

## 2013-11-15 MED ORDER — POTASSIUM CHLORIDE CRYS ER 20 MEQ PO TBCR: 40.0000 meq | EXTENDED_RELEASE_TABLET | Freq: Two times a day (BID) | ORAL | Status: DC

## 2013-11-15 MED ORDER — FUROSEMIDE 40 MG PO TABS
60.0000 mg | ORAL_TABLET | Freq: Two times a day (BID) | ORAL | Status: DC
  Administered 2013-11-15 – 2013-11-16 (×3): 60 mg via ORAL
  Filled 2013-11-15 (×5): qty 1

## 2013-11-15 NOTE — Progress Notes (Signed)
CSW engaged in life review with pt, and pt spoke about her work experience, raising her two children, and her four grandchildren. Pt explained she is still eager to discharge home soon, and is wishing she can go home tomorrow but realizes leaving this weekend is more likely. Pt states she is going to transfer to another unit, and CSW explained that this is good because she is going to what we call a step-down unit, which means she is medically getting better and closer to discharge. Pt was happy to hear this, and explained that her heart rate has been controlled since last night and she is feeling better. CSW explained that if pt transfers off this unit, CSW will not be following her as this is my unit. Pt understands, and thanked CSW for support. Pt invited CSW to visit her on the new unit, and I explained I will do this if time allows.  Ky Barban, MSW, [facility] Clinical Social Worker [phone]

## 2013-11-15 NOTE — Progress Notes (Signed)
Patient ID: Cynthia Herrera, female   DOB: [birth year], 68 y.o.   MRN: [id number]   SUBJECTIVE: Patient initially failed DCCV yesterday am but converted on her own during the day and is remaining in NSR.  She is off oxygen now.  She has diuresed reasonably and weight is down.    Marland Kitchen amiodarone  400 mg Oral TID  . atorvastatin  20 mg Oral QHS  . bupivacaine liposome  20 mL Infiltration To OR  . calcium carbonate  1,000 mg of elemental calcium Oral Q breakfast  . cholecalciferol  2,000 Units Oral Daily  . digoxin  0.125 mg Oral Daily  . furosemide  60 mg Oral BID  . losartan  25 mg Oral BID  . metoprolol succinate  50 mg Oral BID  . multivitamin with minerals  1 tablet Oral Daily  . potassium chloride  40 mEq Oral Once  . potassium chloride  40 mEq Oral Once  . rivaroxaban  20 mg Oral Q supper  . sodium chloride  3 mL Intravenous Q12H  . sodium chloride  3 mL Intravenous Q12H  . spironolactone  25 mg Oral Daily  amiodarone gtt    Filed Vitals:   11/15/13 0345 11/15/13 0600 11/15/13 0754 11/15/13 0759  BP: 106/67  97/53 97/53  Pulse: 76  70 67  Temp: 98 F (36.7 C)   97.5 F (36.4 C)  TempSrc: Oral   Oral  Resp: 18     Height:  5' 5.5" (1.664 m)    Weight:  242 lb 8.1 oz (110 kg)    SpO2: 99%   97%    Intake/Output Summary (Last 24 hours) at 11/15/13 0943 Last data filed at 11/15/13 0700  Gross per 24 hour  Intake  600.7 ml  Output   1850 ml  Net -1249.3 ml    LABS: Basic Metabolic Panel:  Recent Labs  11/14/13 0340 11/15/13 0310  NA 133* 135*  K 3.7 3.3*  CL 89* 92*  CO2 28 28  GLUCOSE 203* 126*  BUN 18 19  CREATININE 0.60 0.72  CALCIUM 8.9 9.0   Liver Function Tests: No results found for this basename: AST, ALT, ALKPHOS, BILITOT, PROT, ALBUMIN,  in the last 72 hours No results found for this basename: LIPASE, AMYLASE,  in the last 72 hours CBC:  Recent Labs  11/14/13 0340 11/15/13 0310  WBC 12.0* 9.8  HGB 11.8* 14.0  HCT 34.5* 39.7  MCV 88.7  88.0  PLT 438* 407*   Cardiac Enzymes: No results found for this basename: CKTOTAL, CKMB, CKMBINDEX, TROPONINI,  in the last 72 hours BNP: No components found with this basename: POCBNP,  D-Dimer: No results found for this basename: DDIMER,  in the last 72 hours Hemoglobin A1C: No results found for this basename: HGBA1C,  in the last 72 hours Fasting Lipid Panel: No results found for this basename: CHOL, HDL, LDLCALC, TRIG, CHOLHDL, LDLDIRECT,  in the last 72 hours Thyroid Function Tests: No results found for this basename: TSH, T4TOTAL, FREET3, T3FREE, THYROIDAB,  in the last 72 hours Anemia Panel: No results found for this basename: VITAMINB12, FOLATE, FERRITIN, TIBC, IRON, RETICCTPCT,  in the last 72 hours  RADIOLOGY: Dg Chest Port 1 View  11/10/2013   CLINICAL DATA:  History of breast malignancy and COPD and tobacco use  EXAM: PORTABLE CHEST - 1 VIEW  COMPARISON:  Portable chest x-ray of November 08, 2013  FINDINGS: The cardiopericardial silhouette remains enlarged. The pulmonary vascularity remains indistinct.  The lungs are better inflated today. The interstitial markings of both lungs are increased but not greatly changed from the previous study. The left hemidiaphragm remains partially obscured.  IMPRESSION: The findings are most compatible with congestive heart failure with pulmonary interstitial edema. I cannot exclude alveolar edema or alveolar pneumonia in the right infrahilar region. When the patient can tolerate the procedure, a PA and lateral chest x-ray would be useful.   Electronically Signed   By: David  Martinique   On: 11/10/2013 15:37   Dg Chest Port 1 View  11/08/2013   CLINICAL DATA:  Shortness of breath, tachycardia  EXAM: PORTABLE CHEST - 1 VIEW  COMPARISON:  10/12/2013  FINDINGS: Cardiomegaly evident with central vascular and interstitial prominence compatible with early edema. Basilar atelectasis noted. No large effusion or pneumothorax. Atherosclerosis of the aorta.  Trachea is midline.  IMPRESSION: Cardiomegaly with mild central hilar developing edema.   Electronically Signed   By: Daryll Brod M.D.   On: 11/08/2013 14:12    PHYSICAL EXAM General: NAD Neck: JVP 8 cm, no thyromegaly or thyroid nodule.  Lungs: Crackles at bases bilaterally.  CV: Nondisplaced PMI.  Heart regular S1/S2, no S3/S4, 1/6 HSM apex.  No peripheral edema.   Abdomen: Soft, nontender, no hepatosplenomegaly, no distention.  Neurologic: Alert and oriented x 3.  Psych: Normal affect. Extremities: No clubbing or cyanosis.   TELEMETRY: Reviewed telemetry pt in NSR in 70s  ASSESSMENT AND PLAN: 68 yo presented with atrial fibrillation/RVR and acute systolic CHF.  1. Atrial fibrillation: Patient was started on amiodarone gtt Sunday afternoon.  TEE-guided DCCV Monday failed (converted, then went back in atrial fibrillation).   DCCV Wednesday after further amiodarone loading also failed (held NSR briefly each time) but went into NSR later Wednesday and in NSR now.  - Continue Toprol XL and digoxin.  - Continue Xarelto - Will load relatively aggressive with amiodarone to try to keep in NSR.  Will stop IV amiodarone and start amiodarone 400 mg po tid today.  - Would have her evaluated for atrial fibrillation ablation, likely as outpatient.  2. Acute systolic CHF: EF 67%, diffuse hypokinesis.  ?etiology: tachy-mediated versus pre-existing nonischemic dilated cardiomyopathy versus ischemic cardiomyopathy (coronary calcification on CT).  Volume is better and she diuresed well.  Renal function stable.  - Can make Lasix po.  - Continue spironolactone, digoxin, Toprol XL, and losartan.  No titration today with soft BP.  - Needs eventual ischemic workup.  Would favor anticoagulation x 1 month post-cardioversion then hold anticoagulation for LHC/RHC.   Loralie Champagne 11/15/2013 9:43 AM

## 2013-11-16 ENCOUNTER — Encounter (HOSPITAL_COMMUNITY): Payer: Self-pay | Admitting: Cardiology

## 2013-11-16 DIAGNOSIS — Z7901 Long term (current) use of anticoagulants: Secondary | ICD-10-CM

## 2013-11-16 DIAGNOSIS — M549 Dorsalgia, unspecified: Secondary | ICD-10-CM

## 2013-11-16 LAB — CBC
HEMATOCRIT: 36.6 % (ref 36.0–46.0)
Hemoglobin: 12.5 g/dL (ref 12.0–15.0)
MCH: 29.9 pg (ref 26.0–34.0)
MCHC: 34.2 g/dL (ref 30.0–36.0)
MCV: 87.6 fL (ref 78.0–100.0)
Platelets: 529 10*3/uL — ABNORMAL HIGH (ref 150–400)
RBC: 4.18 MIL/uL (ref 3.87–5.11)
RDW: 14.4 % (ref 11.5–15.5)
WBC: 13 10*3/uL — AB (ref 4.0–10.5)

## 2013-11-16 LAB — BASIC METABOLIC PANEL
BUN: 20 mg/dL (ref 6–23)
CHLORIDE: 93 meq/L — AB (ref 96–112)
CO2: 28 mEq/L (ref 19–32)
CREATININE: 0.8 mg/dL (ref 0.50–1.10)
Calcium: 9.5 mg/dL (ref 8.4–10.5)
GFR calc non Af Amer: 74 mL/min — ABNORMAL LOW (ref >90)
GFR, EST AFRICAN AMERICAN: 86 mL/min — AB (ref >90)
GLUCOSE: 122 mg/dL — AB (ref 70–99)
Potassium: 4.3 mEq/L (ref 3.7–5.3)
Sodium: 134 mEq/L — ABNORMAL LOW (ref 137–147)

## 2013-11-16 MED ORDER — RIVAROXABAN 20 MG PO TABS: 20.0000 mg | ORAL_TABLET | Freq: Every day | ORAL | Status: DC

## 2013-11-16 MED ORDER — FUROSEMIDE 20 MG PO TABS: 60.0000 mg | ORAL_TABLET | Freq: Two times a day (BID) | ORAL | Status: DC

## 2013-11-16 MED ORDER — DIGOXIN 125 MCG PO TABS: 0.1250 mg | ORAL_TABLET | Freq: Every day | ORAL | Status: DC

## 2013-11-16 MED ORDER — ATORVASTATIN CALCIUM 20 MG PO TABS: 20.0000 mg | ORAL_TABLET | Freq: Every day | ORAL | Status: DC

## 2013-11-16 MED ORDER — METOPROLOL SUCCINATE ER 25 MG PO TB24
25.0000 mg | ORAL_TABLET | Freq: Two times a day (BID) | ORAL | Status: DC
  Filled 2013-11-16 (×2): qty 1

## 2013-11-16 MED ORDER — LOSARTAN POTASSIUM 25 MG PO TABS: 25.0000 mg | ORAL_TABLET | Freq: Two times a day (BID) | ORAL | Status: DC

## 2013-11-16 MED ORDER — METOPROLOL SUCCINATE ER 25 MG PO TB24: 25.0000 mg | ORAL_TABLET | Freq: Two times a day (BID) | ORAL | Status: DC

## 2013-11-16 MED ORDER — ACETAMINOPHEN 325 MG PO TABS: 650.0000 mg | ORAL_TABLET | ORAL | Status: DC | PRN

## 2013-11-16 MED ORDER — NITROGLYCERIN 0.4 MG SL SUBL: 0.4000 mg | SUBLINGUAL_TABLET | SUBLINGUAL | Status: DC | PRN

## 2013-11-16 MED ORDER — AMIODARONE HCL 200 MG PO TABS: ORAL_TABLET | ORAL | Status: DC

## 2013-11-16 MED ORDER — AMIODARONE HCL 200 MG PO TABS
200.0000 mg | ORAL_TABLET | Freq: Two times a day (BID) | ORAL | Status: DC
  Filled 2013-11-16: qty 1

## 2013-11-16 MED ORDER — SPIRONOLACTONE 25 MG PO TABS: 25.0000 mg | ORAL_TABLET | Freq: Every day | ORAL | Status: DC

## 2013-11-16 NOTE — Progress Notes (Signed)
Ambulated pt in hallway 161ft; pt tolerated well, only complaint was feeling tired. Pt back in room and eating lunch

## 2013-11-16 NOTE — Discharge Summary (Signed)
Seen and examined, agree with above. 35 minutes spent on discharge, review of medical records, lab work, appointments. Please see prior note for details.

## 2013-11-16 NOTE — Progress Notes (Signed)
pts BP 90/54 manually, held pts losartan and aldactone; pt did receive torprlol xl and lasix earlier this AM

## 2013-11-16 NOTE — Discharge Summary (Signed)
Patient ID: Cynthia Herrera,  MRN: [id number], DOB/AGE: [birth year] 68 y.o.  Admit date: 11/08/2013 Discharge date: 11/16/2013  Primary Care Provider: Nyoka Cowden, MD   Primary Cardiologist: Dr Darene Lamer. 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  CT scan revealed Ca++ coronaries. Her EKG show AF with RVR. She was treated with IV diuretics and admitted. She ruled out for an MI. Echo revealed dilated CM with an EF of 15-20%. She had been placed on Heparin on admission. She was set up for a TEE CV which was done  11/12/13. Amiodarone had been started 11/11/13. She failed to hold NSR after her cardioversion 1/19.  Another attempt at [place] was done 1/21/5 after more Amiodarone but she failed to convert. Later on the 21st she converted spontaneously to NSR. She was seen by Dr Olin Pia in consult 1/21/5. The plan is for 3-4 weeks of oral anticoagulation then stop this for Rt and Lt heart cath.  Her Amiodarone was cut back to 200 mg BID at discharge with plans to reduce this to 200 mg daily in 7 days. She'll see Dr Radford Pax on Jan 30th in the office. Wgt on admission was 248, at discharge 242. She diuresed a total of 11 Liters.   Discharge Vitals:  Blood pressure 108/49, pulse 68, temperature 97.4 F (36.3 C), temperature source Oral, resp. rate 20, height 5' 5.5" (1.664 m), weight 244 lb 3.2 oz (110.768 kg),  SpO2 93.00%.    Labs: Results for orders placed during the hospital encounter of 11/08/13 (from the past 48 hour(s))  CBC     Status: Abnormal   Collection Time    11/15/13  3:10 AM      Result Value Range   WBC 9.8  4.0 - 10.5 K/uL   RBC 4.51  3.87 - 5.11 MIL/uL   Hemoglobin 14.0  12.0 - 15.0 g/dL   HCT 39.7  36.0 - 46.0 %   MCV 88.0  78.0 - 100.0 fL   MCH 31.0  26.0 - 34.0 pg   MCHC 35.3  30.0 - 36.0 g/dL   RDW 14.3  11.5 - 15.5 %   Platelets 407 (*) 150 - 400 K/uL  BASIC METABOLIC PANEL     Status: Abnormal   Collection Time    11/15/13  3:10 AM      Result Value Range   Sodium 135 (*) 137 - 147 mEq/L   Potassium 3.3 (*) 3.7 - 5.3 mEq/L   Chloride 92 (*) 96 - 112 mEq/L   CO2 28  19 - 32 mEq/L   Glucose, Bld 126 (*) 70 - 99 mg/dL   BUN 19  6 - 23 mg/dL   Creatinine, Ser 0.72  0.50 - 1.10 mg/dL   Calcium 9.0  8.4 -  10.5 mg/dL   GFR calc non Af Amer 86 (*) >90 mL/min   GFR calc Af Amer >90  >90 mL/min   Comment: (NOTE)     The eGFR has been calculated using the CKD EPI equation.     This calculation has not been validated in all clinical situations.     eGFR's persistently <90 mL/min signify possible Chronic Kidney     Disease.  BASIC METABOLIC PANEL     Status: Abnormal   Collection Time    11/15/13 10:30 AM      Result Value Range   Sodium 134 (*) 137 - 147 mEq/L   Potassium 2.7 (*) 3.7 - 5.3 mEq/L   Comment: CRITICAL RESULT CALLED TO, READ BACK BY AND VERIFIED WITH:     C ARMSTRONG,RN AT 1205 11/15/13 BY K BARR   Chloride 88 (*) 96 - 112 mEq/L   CO2 28  19 - 32 mEq/L   Glucose, Bld 216 (*) 70 - 99 mg/dL   BUN 18  6 - 23 mg/dL   Creatinine, Ser 0.66  0.50 - 1.10 mg/dL   Calcium 8.9  8.4 - 10.5 mg/dL   GFR calc non Af Amer 89 (*) >90 mL/min   GFR calc Af Amer >90  >90 mL/min   Comment: (NOTE)     The eGFR has been calculated using the CKD EPI equation.     This calculation has not been validated in all clinical situations.     eGFR's persistently <90 mL/min  signify possible Chronic Kidney     Disease.  CBC     Status: Abnormal   Collection Time    11/16/13  4:07 AM      Result Value Range   WBC 13.0 (*) 4.0 - 10.5 K/uL   RBC 4.18  3.87 - 5.11 MIL/uL   Hemoglobin 12.5  12.0 - 15.0 g/dL   HCT 36.6  36.0 - 46.0 %   MCV 87.6  78.0 - 100.0 fL   MCH 29.9  26.0 - 34.0 pg   MCHC 34.2  30.0 - 36.0 g/dL   RDW 14.4  11.5 - 15.5 %   Platelets 529 (*) 150 - 400 K/uL  BASIC METABOLIC PANEL     Status: Abnormal   Collection Time    11/16/13  4:07 AM      Result Value Range   Sodium 134 (*) 137 - 147 mEq/L   Potassium 4.3  3.7 - 5.3 mEq/L   Comment: DELTA CHECK NOTED   Chloride 93 (*) 96 - 112 mEq/L   CO2 28  19 - 32 mEq/L   Glucose, Bld 122 (*) 70 - 99 mg/dL   BUN 20  6 - 23 mg/dL   Creatinine, Ser 0.80  0.50 - 1.10 mg/dL   Calcium 9.5  8.4 - 10.5 mg/dL   GFR calc non Af Amer 74 (*) >90 mL/min   GFR calc Af Amer 86 (*) >90 mL/min   Comment: (NOTE)     The eGFR has been calculated using the CKD EPI equation.     This calculation has not been validated in all clinical situations.     eGFR's persistently <90 mL/min signify possible Chronic Kidney     Disease.    Disposition:      Follow-up Information   Follow up with Sueanne Margarita, MD On 11/23/2013. (8 am)    Specialty:  Cardiology   Contact information:   [address]  [address] [phone]       Discharge Medications:    Medication List    STOP taking these medications       fexofenadine-pseudoephedrine 60-120 MG per tablet  Commonly known as:  ALLEGRA-D     ibuprofen 800 MG tablet  Commonly known as:  ADVIL,MOTRIN     simvastatin 40 MG tablet  Commonly known as:  ZOCOR     valsartan-hydrochlorothiazide 160-25 MG per tablet  Commonly known as:  DIOVAN-HCT      TAKE these medications       acetaminophen 325 MG tablet  Commonly known as:  TYLENOL  Take 2 tablets (650 mg total) by mouth every 4 (four) hours as needed for headache or mild  pain.     amiodarone 200 MG tablet  Commonly known as:  PACERONE  One twice a day for 7 days, then one a day     atorvastatin 20 MG tablet  Commonly known as:  LIPITOR  Take 1 tablet (20 mg total) by mouth at bedtime.     calcium carbonate 600 MG Tabs tablet  Commonly known as:  OS-CAL  Take 600 mg by mouth daily with breakfast. 2 dialy     cyclobenzaprine 10 MG tablet  Commonly known as:  FLEXERIL  Take 1 tablet (10 mg total) by mouth 3 (three) times daily as needed for muscle spasms.     digoxin 0.125 MG tablet  Commonly known as:  LANOXIN  Take 1 tablet (0.125 mg total) by mouth daily.     fluticasone 50 MCG/ACT nasal spray  Commonly known as:  FLONASE  Place 1 spray into both nostrils daily as needed for allergies.     furosemide 20 MG tablet  Commonly known as:  LASIX  Take 3 tablets (60 mg total) by mouth 2 (two) times daily.     Hydrocodone-Acetaminophen 5-300 MG Tabs  Take 1 tablet by mouth every 6 (six) hours as needed (pain).     losartan 25 MG tablet  Commonly known as:  COZAAR  Take 1 tablet (25 mg total) by mouth 2 (two) times daily.     metoprolol succinate 25 MG 24 hr tablet  Commonly known as:  TOPROL-XL  Take 1 tablet (25 mg total) by mouth 2 (two) times daily.     multivitamin tablet  Take 1 tablet by mouth daily.     nitroGLYCERIN 0.4 MG SL tablet  Commonly known as:  NITROSTAT  Place 1 tablet (0.4 mg total) under the tongue every 5 (five) minutes x 3 doses as needed for chest pain.     Rivaroxaban 20 MG Tabs tablet  Commonly known as:  XARELTO  Take 1 tablet (20 mg total) by mouth daily with supper.     spironolactone 25 MG tablet  Commonly known as:  ALDACTONE  Take 1 tablet (25 mg total) by mouth daily.     Vitamin D 2000 UNITS tablet  Take 2,000 Units by mouth daily.         Duration of Discharge Encounter: Greater than 30 minutes including physician time.  Angelena Form PA-C 11/16/2013 2:44 PM

## 2013-11-16 NOTE — Progress Notes (Signed)
D/c order received;IV removed with gauze on, pt remains in stable condition, pt meds and instructions reviewed and given to pt; pt d/c to home; reviewed s/s of CHF and when to call the MD

## 2013-11-16 NOTE — Progress Notes (Addendum)
Subjective:  Feels better. Breathing better. Mild unsteadiness after large BM this am. Told me that back pain is main issue, chronic. Missed appt with Dr. Ellene Route.   Had losartan held this am with BP 90's SBP.   Objective:  Vital Signs in the last 24 hours: Temp:  [97.7 F (36.5 C)-97.8 F (36.6 C)] 97.8 F (36.6 C) (01/23 0500) Pulse Rate:  [68-75] 68 (01/23 1002) Resp:  [16-18] 18 (01/23 0500) BP: (90-160)/(54-69) 90/54 mmHg (01/23 1009) SpO2:  [96 %-100 %] 96 % (01/23 0500) Weight:  [244 lb 3.2 oz (110.768 kg)] 244 lb 3.2 oz (110.768 kg) (01/23 0500)  Intake/Output from previous day: 01/22 0701 - 01/23 0700 In: 36.4 [I.V.:36.4] Out: 700 [Urine:700]   Physical Exam: General: Well developed, well nourished, in no acute distress. Head:  Normocephalic and atraumatic. Lungs: Clear to auscultation and percussion. Minimal clearing rhonchi at bases.  Heart: Normal S1 and S2.  No murmur, rubs or gallops.  Abdomen: soft, non-tender, positive bowel sounds. Obese Extremities: No clubbing or cyanosis. No edema. Neurologic: Alert and oriented x 3.    Lab Results:  Recent Labs  11/15/13 0310 11/16/13 0407  WBC 9.8 13.0*  HGB 14.0 12.5  PLT 407* 529*    Recent Labs  11/15/13 1030 11/16/13 0407  NA 134* 134*  K 2.7* 4.3  CL 88* 93*  CO2 28 28  GLUCOSE 216* 122*  BUN 18 20  CREATININE 0.66 0.80   . amiodarone  400 mg Oral TID  . atorvastatin  20 mg Oral QHS  . calcium carbonate  1,000 mg of elemental calcium Oral Q breakfast  . cholecalciferol  2,000 Units Oral Daily  . digoxin  0.125 mg Oral Daily  . furosemide  60 mg Oral BID  . losartan  25 mg Oral BID  . metoprolol succinate  25 mg Oral BID  . multivitamin with minerals  1 tablet Oral Daily  . rivaroxaban  20 mg Oral Q supper  . sodium chloride  3 mL Intravenous Q12H  . sodium chloride  3 mL Intravenous Q12H  . spironolactone  25 mg Oral Daily    Telemetry: NSR currently.  Personally viewed.     Cardiac Studies:  EF 20%  Assessment/Plan:   68 year old with newly discovered cardiomyopathy EF 20%, AFIB now NSR, HTN, HL, coronary artery calcification.   1) Cardiomyopathy   - admitted 11/08/13.   - Unable to further titrate medications due to low normal BP.   - Will need cath R+L in 3-4 weeks after completed anticoagulation post conversion. Exclude CAD.   - Feels comfortable today and is willing to go home.   - Will ambulate and potential DC later this afternoon after lunch.    2) AFIB  - chemical conversion on amiodarone load  - started amio 400 TID on 1/16. Has received 8.8 grams total. I will decrease to 200mg  BID with hope to change to 200mg  QD in one week.   - Continue Xarelto.   3) Acute systolic heart failure with prior acute respiratory failure  - improved with diuresis.   - when admitted, had concomitant acute respiratory failure with hypoxia which improved through diuresis.   OK with DC after lunch. Needs follow up with Dr. Radford Pax in one week and if she is not available then APP.    -continue meds at home as listed above.  -check BMET in one week.   3) HL  - statin.  Kenzie Thoreson, [place] 11/16/2013, 11:02 AM

## 2013-11-20 ENCOUNTER — Telehealth: Payer: Self-pay | Admitting: *Deleted

## 2013-11-20 NOTE — Telephone Encounter (Signed)
Initiated [employer] PA for [employer] for patient

## 2013-11-22 NOTE — Telephone Encounter (Signed)
[facility] approved the xarelto 20 mg tablet through 11/19/2014

## 2013-11-23 ENCOUNTER — Ambulatory Visit (INDEPENDENT_AMBULATORY_CARE_PROVIDER_SITE_OTHER): Payer: Medicare Other | Admitting: Cardiology

## 2013-11-23 ENCOUNTER — Encounter: Payer: Self-pay | Admitting: Cardiology

## 2013-11-23 VITALS — BP 122/60 | HR 99 | Ht 66.0 in | Wt 237.0 lb

## 2013-11-23 DIAGNOSIS — Z7901 Long term (current) use of anticoagulants: Secondary | ICD-10-CM

## 2013-11-23 DIAGNOSIS — I4819 Other persistent atrial fibrillation: Secondary | ICD-10-CM | POA: Insufficient documentation

## 2013-11-23 DIAGNOSIS — I420 Dilated cardiomyopathy: Secondary | ICD-10-CM

## 2013-11-23 DIAGNOSIS — I509 Heart failure, unspecified: Secondary | ICD-10-CM

## 2013-11-23 DIAGNOSIS — I428 Other cardiomyopathies: Secondary | ICD-10-CM

## 2013-11-23 DIAGNOSIS — I4891 Unspecified atrial fibrillation: Secondary | ICD-10-CM

## 2013-11-23 DIAGNOSIS — I5042 Chronic combined systolic (congestive) and diastolic (congestive) heart failure: Secondary | ICD-10-CM | POA: Insufficient documentation

## 2013-11-23 DIAGNOSIS — I5022 Chronic systolic (congestive) heart failure: Secondary | ICD-10-CM

## 2013-11-23 DIAGNOSIS — I2510 Atherosclerotic heart disease of native coronary artery without angina pectoris: Secondary | ICD-10-CM

## 2013-11-23 DIAGNOSIS — I10 Essential (primary) hypertension: Secondary | ICD-10-CM

## 2013-11-23 LAB — BASIC METABOLIC PANEL
BUN: 22 mg/dL (ref 6–23)
CO2: 23 meq/L (ref 19–32)
CREATININE: 1.1 mg/dL (ref 0.4–1.2)
Calcium: 9.3 mg/dL (ref 8.4–10.5)
Chloride: 101 mEq/L (ref 96–112)
GFR: 53.03 mL/min — ABNORMAL LOW (ref >60.00)
GLUCOSE: 174 mg/dL — AB (ref 70–99)
Potassium: 3 mEq/L — ABNORMAL LOW (ref 3.5–5.1)
Sodium: 136 mEq/L (ref 135–145)

## 2013-11-23 NOTE — Progress Notes (Signed)
[address] [place], [place]  [address] Phone: [phone] Fax:  [phone]  Date:  11/23/2013   ID:  Cynthia, Herrera [birth year], MRN [id number]  PCP:  Nyoka Cowden, MD  Cardiologist:  [name], MD     History of Present Illness: Cynthia Herrera is a 68 y.o. 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  She was also in atrial fibrillation with RVR.  She was diuresed and ruled out for MI.  Echo showed EF 15-20%.  She was started on Amiodarone and on 11/12/2013 underwent TEE/DCCV to NSR but failed to hold NSR.  She was again cardioverted on 1/21 after more loading with amio but failed to convert.  Later that day she spontaneously converted to NSR.  She now presents back today for followup.  She denies any SOB although she has not been very active.  She denies any chest pain, LE edema, palpitations or syncope.  Her weight when she got home was 241 and she is now down to around 237.   Wt Readings from Last 3 Encounters:  11/23/13 237 lb (107.502 kg)  11/16/13 244 lb 3.2 oz (110.768 kg)  11/16/13 244 lb 3.2 oz (110.768 kg)     Past Medical History  Diagnosis Date  . Personal history of colonic polyps     adenomas 03 and 08  . Hyperlipidemia   . Hypertension   . Osteopenia   . Vitamin D deficiency   . Allergy     seasonal  . Arthritis   . Breast cancer     left: surgery,chemo,radiation  . COPD (chronic obstructive pulmonary disease)   . Atrial fibrillation     s/p TEE DCCV and repeat DCCV 11/14/2013  . DCM (dilated cardiomyopathy)     EF 15-20% ? tachycardia induced  . Chronic systolic CHF (congestive heart failure)     Current Outpatient Prescriptions  Medication Sig Dispense Refill  . acetaminophen (TYLENOL) 325 MG tablet Take 2 tablets (650 mg total) by mouth every 4 (four) hours as needed for  headache or mild pain.      Marland Kitchen amiodarone (PACERONE) 200 MG tablet One twice a day for 7 days, then one a day  60 tablet  5  . atorvastatin (LIPITOR) 20 MG tablet Take 1 tablet (20 mg total) by mouth at bedtime.  30 tablet  5  . calcium carbonate (OS-CAL) 600 MG TABS Take 600 mg by mouth daily with breakfast. 2 dialy      . Cholecalciferol (VITAMIN D) 2000 UNITS tablet Take 2,000 Units by mouth daily.        . cyclobenzaprine (FLEXERIL) 10 MG tablet Take 1 tablet (10 mg total) by mouth 3 (three) times daily as needed for muscle spasms.  30 tablet  2  . digoxin (LANOXIN) 0.125 MG tablet Take 1 tablet (0.125 mg total) by mouth daily.  30 tablet  5  . fluticasone (FLONASE) 50 MCG/ACT nasal spray Place 1 spray into both nostrils daily as needed for allergies.      . furosemide (LASIX) 20 MG tablet Take 3 tablets (60 mg total) by mouth 2 (two) times daily.  100 tablet  5  . Hydrocodone-Acetaminophen 5-300 MG TABS Take 1 tablet by mouth every 6 (six) hours as needed (pain).      Marland Kitchen losartan (COZAAR) 25 MG tablet Take 1 tablet (25  mg total) by mouth 2 (two) times daily.  60 tablet  5  . metoprolol succinate (TOPROL-XL) 25 MG 24 hr tablet Take 1 tablet (25 mg total) by mouth 2 (two) times daily.  60 tablet  5  . Multiple Vitamin (MULTIVITAMIN) tablet Take 1 tablet by mouth daily.        . nitroGLYCERIN (NITROSTAT) 0.4 MG SL tablet Place 1 tablet (0.4 mg total) under the tongue every 5 (five) minutes x 3 doses as needed for chest pain.  25 tablet  2  . Rivaroxaban (XARELTO) 20 MG TABS tablet Take 1 tablet (20 mg total) by mouth daily with supper.  30 tablet  5  . spironolactone (ALDACTONE) 25 MG tablet Take 1 tablet (25 mg total) by mouth daily.  30 tablet  5   No current facility-administered medications for this visit.    Allergies:    Allergies  Allergen Reactions  . Codeine Phosphate Other (See Comments)    Weird feeling "    Social History:  The patient  reports that she quit smoking about 12  years ago. She has never used smokeless tobacco. She reports that she does not drink alcohol or use illicit drugs.   Family History:  The patient's family history includes Cancer in her mother; Dementia in her sister; Diabetes in her brother, father, mother, and sister; Heart disease in her brother and father; Liver cancer in her mother.   ROS:  Please see the history of present illness.      All other systems reviewed and negative.   PHYSICAL EXAM: VS:  BP 122/60  Pulse 99  Ht 5\' 6"  (1.676 m)  Wt 237 lb (107.502 kg)  BMI 38.27 kg/m2 Well nourished, well developed, in no acute distress HEENT: normal Neck: no JVD Cardiac:  normal S1, S2; irregularly irregular; no murmur Lungs:  clear to auscultation bilaterally, no wheezing, rhonchi or rales Abd: soft, nontender, no hepatomegaly Ext: no edema Skin: warm and dry Neuro:  CNs 2-12 intact, no focal abnormalities noted  EKG:  Atrial fibrillation with CVR     ASSESSMENT AND PLAN:  1. Atrial fibrillation s/p recent TEE/DCCV now back in atrial fibrillation rate fairly well controlled  - she is back in atrial fibrillation with borderline rate control.  She will continue on metoprolol/digoxin/amiodarone and Xarelto.  I will check her dig level and amiodarone level today.  If amio is therapeutic I will set her up for DCCV as outpt.  If it is low then will reload with amio and recheck level in a few weeks before DCCV. 2. Chronic systolic CHF - she appears euvolemic today.  Her ideal dry weight is between 235- 240  - continue Lasix/Aldactone/beta blocker/dig/ARB  - check BMET/dig level  today 3. DCM EF 15-20% ? Tachycardia induced.  I will repeat an echo in a month now that she is rate controlled.  If LVF is still reduced then will plan right and left heart cath.  If normalized then plan for Lexiscan CL to rule out ischemia given coronary artery calcifications on chest CT 4. HTN - well controlled  - continue metoprolol/Losartan 5. ASCAD with  coronary artery calcifications on CT scan 6. Dyslipidemia 7. Moderate MR most likely due to LV dysfunction  Followup with me in 6 weeks  Signed, [name], MD 11/23/2013 8:27 AM

## 2013-11-23 NOTE — Patient Instructions (Signed)
Your physician recommends that you continue on your current medications as directed. Please refer to the Current Medication list given to you today.  Your physician recommends that you go to the lab today for a BMET, AMIO, and Digoxin Level.  Your physician recommends that you schedule a follow-up appointment in: 6 Weeks with Dr Radford Pax

## 2013-11-24 LAB — DIGOXIN LEVEL: Digoxin Level: 0.8 ng/mL (ref 0.8–2.0)

## 2013-11-26 ENCOUNTER — Telehealth: Payer: Self-pay | Admitting: General Surgery

## 2013-11-26 DIAGNOSIS — Z79899 Other long term (current) drug therapy: Secondary | ICD-10-CM

## 2013-11-26 MED ORDER — POTASSIUM CHLORIDE CRYS ER 20 MEQ PO TBCR: EXTENDED_RELEASE_TABLET | ORAL | Status: DC

## 2013-11-26 NOTE — Telephone Encounter (Signed)
Rx sent in for pt and pt made aware. Lab work set up for [employer]

## 2013-11-26 NOTE — Telephone Encounter (Signed)
Message copied by Lily Kocher on Mon Nov 26, 2013  2:14 PM ------      Message from: [name] R      Created: Mon Nov 26, 2013  1:45 PM       Dig level ok.  Amio level pending.  Renal function stable but potassium is low - please have patient take Kdur 20 meq take 2 tablets now and repeat 2 tablets  in 4 hours then 1 tablet daily starting tomorrow.  Please refer her to Spironolactone pharmacy clinic in our office.  Repeat BMET on Wednesday 2/4 ------

## 2013-11-27 LAB — AMIODARONE LEVEL
Amiodarone Lvl: 0.5 ug/mL — ABNORMAL LOW (ref 1.5–2.5)
DESETHYLAMIODARONE: 0.3 ug/mL — AB (ref 1.5–2.5)

## 2013-11-28 ENCOUNTER — Encounter: Payer: Self-pay | Admitting: General Surgery

## 2013-11-28 ENCOUNTER — Other Ambulatory Visit (INDEPENDENT_AMBULATORY_CARE_PROVIDER_SITE_OTHER): Payer: Medicare Other

## 2013-11-28 ENCOUNTER — Other Ambulatory Visit: Payer: Self-pay | Admitting: General Surgery

## 2013-11-28 ENCOUNTER — Telehealth: Payer: Self-pay | Admitting: General Surgery

## 2013-11-28 DIAGNOSIS — Z79899 Other long term (current) drug therapy: Secondary | ICD-10-CM

## 2013-11-28 LAB — BASIC METABOLIC PANEL
BUN: 14 mg/dL (ref 6–23)
CHLORIDE: 100 meq/L (ref 96–112)
CO2: 27 meq/L (ref 19–32)
Calcium: 9.4 mg/dL (ref 8.4–10.5)
Creatinine, Ser: 0.8 mg/dL (ref 0.4–1.2)
GFR: 74.7 mL/min (ref >60.00)
Glucose, Bld: 182 mg/dL — ABNORMAL HIGH (ref 70–99)
Potassium: 3 mEq/L — ABNORMAL LOW (ref 3.5–5.1)
Sodium: 137 mEq/L (ref 135–145)

## 2013-11-28 NOTE — Telephone Encounter (Signed)
Message copied by Lily Kocher on Wed Nov 28, 2013 10:11 AM ------      Message from: Jonathon Jordan      Created: Tue Nov 27, 2013  4:25 PM                   ----- Message -----         From: Sueanne Margarita, MD         Sent: 11/27/2013   3:42 PM           To: Lily Kocher, CMA, [facility] Triage            Amio level subtherapeutic - please have patient take amiodarone 200mg  BID for 2 weeks and then recheck amio level ------

## 2013-11-28 NOTE — Telephone Encounter (Signed)
Pt is aware. Med list updated and lab added.

## 2013-11-30 ENCOUNTER — Telehealth: Payer: Self-pay | Admitting: General Surgery

## 2013-11-30 DIAGNOSIS — Z79899 Other long term (current) drug therapy: Secondary | ICD-10-CM

## 2013-11-30 LAB — AMIODARONE LEVEL
Amiodarone Lvl: 0.4 ug/mL — ABNORMAL LOW (ref 1.5–2.5)
Desethylamiodarone: 0.3 ug/mL — ABNORMAL LOW (ref 1.5–2.5)

## 2013-11-30 NOTE — Telephone Encounter (Signed)
Pt has taken the Potassium as prescribed. She started it on 2/2. I told her we now wanted her to take 44meq 1 tablet now and repeat 2 tablets in 4 hours then starting tomorrow 2 tablets daily and check BMET on Monday 12/03/13 since we didn't talk until Friday. Pt aware and set to come in Monday.

## 2013-11-30 NOTE — Telephone Encounter (Signed)
Message copied by Lily Kocher on Fri Nov 30, 2013  2:34 PM ------      Message from: [name] R      Created: Wed Nov 28, 2013  4:03 PM       Potassium still low - please make sure she is taking her potassium suppl - please have her increase her potassium to 67meq 1 tablet now and repeat 2 tablets in 4 hours then starting tomorrow 2 tablets daily and check BMET on Friday 2/6 ------

## 2013-12-03 ENCOUNTER — Other Ambulatory Visit (INDEPENDENT_AMBULATORY_CARE_PROVIDER_SITE_OTHER): Payer: Medicare Other

## 2013-12-03 DIAGNOSIS — Z79899 Other long term (current) drug therapy: Secondary | ICD-10-CM

## 2013-12-03 LAB — BASIC METABOLIC PANEL
BUN: 17 mg/dL (ref 6–23)
CHLORIDE: 102 meq/L (ref 96–112)
CO2: 23 meq/L (ref 19–32)
CREATININE: 1 mg/dL (ref 0.4–1.2)
Calcium: 9.5 mg/dL (ref 8.4–10.5)
GFR: 58.57 mL/min — ABNORMAL LOW (ref >60.00)
Glucose, Bld: 172 mg/dL — ABNORMAL HIGH (ref 70–99)
Potassium: 3.6 mEq/L (ref 3.5–5.1)
Sodium: 136 mEq/L (ref 135–145)

## 2013-12-04 ENCOUNTER — Other Ambulatory Visit: Payer: Self-pay | Admitting: General Surgery

## 2013-12-04 MED ORDER — POTASSIUM CHLORIDE CRYS ER 20 MEQ PO TBCR: 20.0000 meq | EXTENDED_RELEASE_TABLET | Freq: Two times a day (BID) | ORAL | Status: DC

## 2013-12-12 ENCOUNTER — Other Ambulatory Visit: Payer: Medicare Other

## 2013-12-17 ENCOUNTER — Encounter: Payer: Self-pay | Admitting: General Surgery

## 2013-12-17 ENCOUNTER — Ambulatory Visit (INDEPENDENT_AMBULATORY_CARE_PROVIDER_SITE_OTHER): Payer: Medicare Other | Admitting: *Deleted

## 2013-12-17 DIAGNOSIS — Z79899 Other long term (current) drug therapy: Secondary | ICD-10-CM

## 2013-12-17 LAB — BASIC METABOLIC PANEL
BUN: 17 mg/dL (ref 6–23)
CALCIUM: 9.6 mg/dL (ref 8.4–10.5)
CO2: 25 meq/L (ref 19–32)
Chloride: 98 mEq/L (ref 96–112)
Creatinine, Ser: 1.1 mg/dL (ref 0.4–1.2)
GFR: 54.76 mL/min — AB (ref >60.00)
GLUCOSE: 199 mg/dL — AB (ref 70–99)
Potassium: 3.9 mEq/L (ref 3.5–5.1)
Sodium: 133 mEq/L — ABNORMAL LOW (ref 135–145)

## 2013-12-20 LAB — AMIODARONE LEVEL
AMIODARONE LVL: 0.5 ug/mL — AB (ref 1.5–2.5)
Desethylamiodarone: 0.2 ug/mL — ABNORMAL LOW (ref 1.5–2.5)

## 2013-12-25 ENCOUNTER — Other Ambulatory Visit: Payer: Self-pay | Admitting: *Deleted

## 2013-12-25 MED ORDER — AMIODARONE HCL 200 MG PO TABS: ORAL_TABLET | ORAL | Status: DC

## 2014-01-03 ENCOUNTER — Ambulatory Visit (INDEPENDENT_AMBULATORY_CARE_PROVIDER_SITE_OTHER): Payer: Medicare Other | Admitting: Cardiology

## 2014-01-03 ENCOUNTER — Encounter: Payer: Self-pay | Admitting: Cardiology

## 2014-01-03 VITALS — BP 110/52 | HR 80 | Ht 66.0 in | Wt 240.0 lb

## 2014-01-03 DIAGNOSIS — I428 Other cardiomyopathies: Secondary | ICD-10-CM

## 2014-01-03 DIAGNOSIS — I4891 Unspecified atrial fibrillation: Secondary | ICD-10-CM

## 2014-01-03 DIAGNOSIS — I509 Heart failure, unspecified: Secondary | ICD-10-CM

## 2014-01-03 DIAGNOSIS — Z7901 Long term (current) use of anticoagulants: Secondary | ICD-10-CM

## 2014-01-03 DIAGNOSIS — I420 Dilated cardiomyopathy: Secondary | ICD-10-CM

## 2014-01-03 DIAGNOSIS — I5022 Chronic systolic (congestive) heart failure: Secondary | ICD-10-CM

## 2014-01-03 DIAGNOSIS — I10 Essential (primary) hypertension: Secondary | ICD-10-CM

## 2014-01-03 DIAGNOSIS — I2510 Atherosclerotic heart disease of native coronary artery without angina pectoris: Secondary | ICD-10-CM

## 2014-01-03 MED ORDER — AMIODARONE HCL 200 MG PO TABS: ORAL_TABLET | ORAL | Status: DC

## 2014-01-03 NOTE — Patient Instructions (Signed)
Your physician has recommended you make the following change in your medication: Decrease Amiodarone to 200 MG 1 tablet daily  Your physician has requested that you have an echocardiogram. Echocardiography is a painless test that uses sound waves to create images of your heart. It provides your doctor with information about the size and shape of your heart and how well your heart's chambers and valves are working. This procedure takes approximately one hour. There are no restrictions for this procedure.  Your physician recommends that you schedule a follow-up appointment in: 3 Months with Dr Radford Pax

## 2014-01-03 NOTE — Progress Notes (Signed)
[address] [place], [place]  [address] Phone: [phone] Fax:  [phone]  Date:  01/03/2014   ID:  Terryann Verbeek, DOB [birth year], MRN [id number]  PCP:  Nyoka Cowden, MD  Cardiologist:  [name], MD     History of Present Illness: Cynthia Herrera is a 68 y.o. 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 She was also in atrial fibrillation with RVR. She was diuresed and ruled out for MI. Echo showed EF 15-20%. She was started on Amiodarone and on 11/12/2013 underwent TEE/DCCV to NSR but failed to hold NSR. She was again cardioverted on 1/21 after more loading with amio but failed to convert. Later that day she spontaneously converted to NSR. She now presents back today for followup. She denies any SOB although she has not been very active. She denies any chest pain, LE edema, palpitations or syncope.     Wt Readings from Last 3 Encounters:  01/03/14 240 lb (108.863 kg)  11/23/13 237 lb (107.502 kg)  11/16/13 244 lb 3.2 oz (110.768 kg)     Past Medical History  Diagnosis Date  . Personal history of colonic polyps     adenomas 03 and 08  . Hyperlipidemia   . Hypertension   . Osteopenia   . Vitamin D deficiency   . Allergy     seasonal  . Arthritis   . Breast cancer     left: surgery,chemo,radiation  . COPD (chronic obstructive pulmonary disease)   . Atrial fibrillation     s/p TEE DCCV and repeat DCCV 11/14/2013  . DCM (dilated cardiomyopathy)     EF 15-20% ? tachycardia induced  . Chronic systolic CHF (congestive heart failure)     Current Outpatient Prescriptions  Medication Sig Dispense Refill  . acetaminophen (TYLENOL) 325 MG tablet Take 2 tablets (650 mg total) by mouth every 4 (four) hours as needed for headache or mild pain.      Marland Kitchen amiodarone (PACERONE) 200 MG tablet Take 2 tablets twice a  day x 1 week; then 2 tablets daily x 1 week;then repeat lab  100 tablet  3  . atorvastatin (LIPITOR) 20 MG tablet Take 1 tablet (20 mg total) by mouth at bedtime.  30 tablet  5  . calcium carbonate (OS-CAL) 600 MG TABS Take 600 mg by mouth daily with breakfast. 2 dialy      . Cholecalciferol (VITAMIN D) 2000 UNITS tablet Take 2,000 Units by mouth daily.        . cyclobenzaprine (FLEXERIL) 10 MG tablet Take 1 tablet (10 mg total) by mouth 3 (three) times daily as needed for muscle spasms.  30 tablet  2  . digoxin (LANOXIN) 0.125 MG tablet Take 1 tablet (0.125 mg total) by mouth daily.  30 tablet  5  . fluticasone (FLONASE) 50 MCG/ACT nasal spray Place 1 spray into both nostrils daily as needed for allergies.      . furosemide (LASIX) 20 MG tablet Take 3 tablets (60 mg total) by mouth 2 (two) times daily.  100 tablet  5  . Hydrocodone-Acetaminophen 5-300 MG TABS Take 1 tablet by mouth every 6 (six) hours as needed (pain).      Marland Kitchen losartan (COZAAR) 25 MG tablet Take 1 tablet (25 mg total) by mouth 2 (two) times daily.  60 tablet  5  . metoprolol succinate (TOPROL-XL) 25  MG 24 hr tablet Take 1 tablet (25 mg total) by mouth 2 (two) times daily.  60 tablet  5  . Multiple Vitamin (MULTIVITAMIN) tablet Take 1 tablet by mouth daily.        . nitroGLYCERIN (NITROSTAT) 0.4 MG SL tablet Place 1 tablet (0.4 mg total) under the tongue every 5 (five) minutes x 3 doses as needed for chest pain.  25 tablet  2  . potassium chloride SA (K-DUR,KLOR-CON) 20 MEQ tablet Take 1 tablet (20 mEq total) by mouth 2 (two) times daily.  60 tablet  11  . Rivaroxaban (XARELTO) 20 MG TABS tablet Take 1 tablet (20 mg total) by mouth daily with supper.  30 tablet  5  . spironolactone (ALDACTONE) 25 MG tablet Take 1 tablet (25 mg total) by mouth daily.  30 tablet  5   No current facility-administered medications for this visit.    Allergies:    Allergies  Allergen Reactions  . Codeine Phosphate Other (See Comments)    Weird  feeling "    Social History:  The patient  reports that she quit smoking about 12 years ago. She has never used smokeless tobacco. She reports that she does not drink alcohol or use illicit drugs.   Family History:  The patient's family history includes Cancer in her mother; Dementia in her sister; Diabetes in her brother, father, mother, and sister; Heart disease in her brother and father; Liver cancer in her mother.   ROS:  Please see the history of present illness.      All other systems reviewed and negative.   PHYSICAL EXAM: VS:  BP 110/52  Pulse 80  Ht 5\' 6"  (1.676 m)  Wt 240 lb (108.863 kg)  BMI 38.76 kg/m2 Well nourished, well developed, in no acute distress HEENT: normal Neck: no JVD Cardiac:  normal S1, S2; RRR; no murmur Lungs:  clear to auscultation bilaterally, no wheezing, rhonchi or rales Abd: soft, nontender, no hepatomegaly Ext: no edema Skin: warm and dry Neuro:  CNs 2-12 intact, no focal abnormalities noted  EKG:  NSR at 67bpm with nonspecific ST abnormality QTc 489msec     ASSESSMENT AND PLAN:  1.  Atrial fibrillation s/p recent TEE/DCCV then reversion back to afib and now back in NSR. - She will continue on metoprolol/digoxin/amiodarone and Xarelto.  2.  Chronic systolic CHF - she appears euvolemic today. Her ideal dry weight is between 235- 240 - continue Lasix/Aldactone/beta blocker/dig/ARB  3.  CM EF 15-20% ? Tachycardia induced.  -I will repeat an echo now that she is rate controlled. If LVF is still reduced then will plan right and left heart cath. If normalized then plan for Lexiscan CL to rule out ischemia given coronary artery calcifications on chest CT 4.  HTN - well controlled - continue metoprolol/Losartan  5.  ASCAD with coronary artery calcifications on CT scan 6.  Dyslipidemia 7.  Moderate MR most likely due to LV dysfunction  Followup with me in 3 months  [name], MD 01/03/2014 4:12 PM

## 2014-01-09 ENCOUNTER — Other Ambulatory Visit: Payer: Medicare Other

## 2014-01-10 ENCOUNTER — Other Ambulatory Visit (INDEPENDENT_AMBULATORY_CARE_PROVIDER_SITE_OTHER): Payer: Medicare Other | Admitting: *Deleted

## 2014-01-10 DIAGNOSIS — Z79899 Other long term (current) drug therapy: Secondary | ICD-10-CM

## 2014-01-10 LAB — BASIC METABOLIC PANEL
BUN: 14 mg/dL (ref 6–23)
CALCIUM: 9.5 mg/dL (ref 8.4–10.5)
CO2: 27 mEq/L (ref 19–32)
CREATININE: 0.9 mg/dL (ref 0.4–1.2)
Chloride: 96 mEq/L (ref 96–112)
GFR: 64.47 mL/min (ref >60.00)
Glucose, Bld: 178 mg/dL — ABNORMAL HIGH (ref 70–99)
Potassium: 4.2 mEq/L (ref 3.5–5.1)
Sodium: 136 mEq/L (ref 135–145)

## 2014-01-11 ENCOUNTER — Telehealth: Payer: Self-pay | Admitting: Cardiology

## 2014-01-11 ENCOUNTER — Encounter: Payer: Self-pay | Admitting: General Surgery

## 2014-01-11 NOTE — Telephone Encounter (Signed)
°

## 2014-01-11 NOTE — Telephone Encounter (Signed)
Pt is aware.  

## 2014-01-21 ENCOUNTER — Ambulatory Visit (HOSPITAL_COMMUNITY): Payer: Medicare Other | Attending: Cardiology | Admitting: Cardiology

## 2014-01-21 DIAGNOSIS — I5022 Chronic systolic (congestive) heart failure: Secondary | ICD-10-CM | POA: Insufficient documentation

## 2014-01-21 DIAGNOSIS — I428 Other cardiomyopathies: Secondary | ICD-10-CM

## 2014-01-21 NOTE — Progress Notes (Signed)
Limited echo performed. 

## 2014-01-22 ENCOUNTER — Encounter (HOSPITAL_COMMUNITY): Payer: Self-pay | Admitting: Pharmacy Technician

## 2014-01-23 ENCOUNTER — Encounter: Payer: Self-pay | Admitting: Cardiology

## 2014-01-23 ENCOUNTER — Other Ambulatory Visit: Payer: Self-pay | Admitting: Cardiology

## 2014-01-23 DIAGNOSIS — I428 Other cardiomyopathies: Secondary | ICD-10-CM

## 2014-01-24 ENCOUNTER — Ambulatory Visit (INDEPENDENT_AMBULATORY_CARE_PROVIDER_SITE_OTHER): Payer: Medicare Other | Admitting: *Deleted

## 2014-01-24 DIAGNOSIS — I428 Other cardiomyopathies: Secondary | ICD-10-CM

## 2014-01-24 LAB — CBC WITH DIFFERENTIAL/PLATELET
BASOS PCT: 0.5 % (ref 0.0–3.0)
Basophils Absolute: 0 10*3/uL (ref 0.0–0.1)
EOS PCT: 2 % (ref 0.0–5.0)
Eosinophils Absolute: 0.2 10*3/uL (ref 0.0–0.7)
HEMATOCRIT: 37.1 % (ref 36.0–46.0)
HEMOGLOBIN: 12.4 g/dL (ref 12.0–15.0)
Lymphocytes Relative: 13.8 % (ref 12.0–46.0)
Lymphs Abs: 1.1 10*3/uL (ref 0.7–4.0)
MCHC: 33.3 g/dL (ref 30.0–36.0)
MCV: 90.5 fl (ref 78.0–100.0)
MONO ABS: 0.6 10*3/uL (ref 0.1–1.0)
MONOS PCT: 7.5 % (ref 3.0–12.0)
NEUTROS ABS: 6 10*3/uL (ref 1.4–7.7)
Neutrophils Relative %: 76.2 % (ref 43.0–77.0)
Platelets: 337 10*3/uL (ref 150.0–400.0)
RBC: 4.11 Mil/uL (ref 3.87–5.11)
RDW: 15.6 % — ABNORMAL HIGH (ref 11.5–14.6)
WBC: 7.9 10*3/uL (ref 4.5–10.5)

## 2014-01-24 LAB — BASIC METABOLIC PANEL
BUN: 13 mg/dL (ref 6–23)
CALCIUM: 9.1 mg/dL (ref 8.4–10.5)
CO2: 28 meq/L (ref 19–32)
CREATININE: 0.9 mg/dL (ref 0.4–1.2)
Chloride: 98 mEq/L (ref 96–112)
GFR: 63.66 mL/min (ref >60.00)
GLUCOSE: 218 mg/dL — AB (ref 70–99)
Potassium: 3.7 mEq/L (ref 3.5–5.1)
Sodium: 135 mEq/L (ref 135–145)

## 2014-01-24 LAB — PROTIME-INR
INR: 1.8 ratio — AB (ref 0.8–1.0)
Prothrombin Time: 19.2 s — ABNORMAL HIGH (ref 10.2–12.4)

## 2014-02-01 ENCOUNTER — Encounter (HOSPITAL_COMMUNITY)
Admission: RE | Disposition: A | Discharge status: Home / Self Care | Payer: Self-pay | Source: Ambulatory Visit | Attending: Cardiology

## 2014-02-01 ENCOUNTER — Ambulatory Visit (HOSPITAL_COMMUNITY)
Admission: RE | Admit: 2014-02-01 | Discharge: 2014-02-01 | Disposition: A | Discharge status: Home / Self Care | Payer: Medicare Other | Source: Ambulatory Visit | Attending: Cardiology | Admitting: Cardiology

## 2014-02-01 DIAGNOSIS — J449 Chronic obstructive pulmonary disease, unspecified: Secondary | ICD-10-CM | POA: Insufficient documentation

## 2014-02-01 DIAGNOSIS — I059 Rheumatic mitral valve disease, unspecified: Secondary | ICD-10-CM | POA: Insufficient documentation

## 2014-02-01 DIAGNOSIS — E785 Hyperlipidemia, unspecified: Secondary | ICD-10-CM | POA: Insufficient documentation

## 2014-02-01 DIAGNOSIS — I2584 Coronary atherosclerosis due to calcified coronary lesion: Secondary | ICD-10-CM | POA: Insufficient documentation

## 2014-02-01 DIAGNOSIS — J4489 Other specified chronic obstructive pulmonary disease: Secondary | ICD-10-CM | POA: Insufficient documentation

## 2014-02-01 DIAGNOSIS — I420 Dilated cardiomyopathy: Secondary | ICD-10-CM

## 2014-02-01 DIAGNOSIS — I10 Essential (primary) hypertension: Secondary | ICD-10-CM | POA: Insufficient documentation

## 2014-02-01 DIAGNOSIS — M899 Disorder of bone, unspecified: Secondary | ICD-10-CM | POA: Insufficient documentation

## 2014-02-01 DIAGNOSIS — I5022 Chronic systolic (congestive) heart failure: Secondary | ICD-10-CM

## 2014-02-01 DIAGNOSIS — I2789 Other specified pulmonary heart diseases: Secondary | ICD-10-CM | POA: Insufficient documentation

## 2014-02-01 DIAGNOSIS — Z87891 Personal history of nicotine dependence: Secondary | ICD-10-CM | POA: Insufficient documentation

## 2014-02-01 DIAGNOSIS — I509 Heart failure, unspecified: Secondary | ICD-10-CM

## 2014-02-01 DIAGNOSIS — I4891 Unspecified atrial fibrillation: Secondary | ICD-10-CM

## 2014-02-01 DIAGNOSIS — M949 Disorder of cartilage, unspecified: Secondary | ICD-10-CM

## 2014-02-01 DIAGNOSIS — E559 Vitamin D deficiency, unspecified: Secondary | ICD-10-CM | POA: Insufficient documentation

## 2014-02-01 DIAGNOSIS — Z7901 Long term (current) use of anticoagulants: Secondary | ICD-10-CM | POA: Insufficient documentation

## 2014-02-01 DIAGNOSIS — I2510 Atherosclerotic heart disease of native coronary artery without angina pectoris: Secondary | ICD-10-CM

## 2014-02-01 DIAGNOSIS — I428 Other cardiomyopathies: Secondary | ICD-10-CM | POA: Insufficient documentation

## 2014-02-01 DIAGNOSIS — Z853 Personal history of malignant neoplasm of breast: Secondary | ICD-10-CM | POA: Insufficient documentation

## 2014-02-01 HISTORY — PX: LEFT AND RIGHT HEART CATHETERIZATION WITH CORONARY ANGIOGRAM: SHX5449

## 2014-02-01 LAB — POCT I-STAT 3, VENOUS BLOOD GAS (G3P V)
Acid-Base Excess: 1 mmol/L (ref 0.0–2.0)
Acid-base deficit: 2 mmol/L (ref 0.0–2.0)
BICARBONATE: 26.5 meq/L — AB (ref 20.0–24.0)
Bicarbonate: 23.3 mEq/L (ref 20.0–24.0)
O2 Saturation: 64 %
O2 Saturation: 68 %
PCO2 VEN: 42.8 mmHg — AB (ref 45.0–50.0)
PH VEN: 7.4 — AB (ref 7.250–7.300)
TCO2: 25 mmol/L (ref 0–100)
TCO2: 28 mmol/L (ref 0–100)
pCO2, Ven: 41.7 mmHg — ABNORMAL LOW (ref 45.0–50.0)
pH, Ven: 7.355 — ABNORMAL HIGH (ref 7.250–7.300)
pO2, Ven: 35 mmHg (ref 30.0–45.0)
pO2, Ven: 36 mmHg (ref 30.0–45.0)

## 2014-02-01 LAB — POCT I-STAT 3, ART BLOOD GAS (G3+)
Acid-Base Excess: 1 mmol/L (ref 0.0–2.0)
BICARBONATE: 24.9 meq/L — AB (ref 20.0–24.0)
O2 SAT: 93 %
PO2 ART: 64 mmHg — AB (ref 80.0–100.0)
TCO2: 26 mmol/L (ref 0–100)
pCO2 arterial: 38.4 mmHg (ref 35.0–45.0)
pH, Arterial: 7.421 (ref 7.350–7.450)

## 2014-02-01 LAB — PROTIME-INR
INR: 0.94 (ref 0.00–1.49)
PROTHROMBIN TIME: 12.4 s (ref 11.6–15.2)

## 2014-02-01 SURGERY — LEFT AND RIGHT HEART CATHETERIZATION WITH CORONARY ANGIOGRAM
Anesthesia: LOCAL

## 2014-02-01 MED ORDER — ASPIRIN 81 MG PO CHEW: 81.0000 mg | CHEWABLE_TABLET | ORAL | Status: DC

## 2014-02-01 MED ORDER — MIDAZOLAM HCL 2 MG/2ML IJ SOLN
INTRAMUSCULAR | Status: AC
  Filled 2014-02-01: qty 2

## 2014-02-01 MED ORDER — NITROGLYCERIN 0.2 MG/ML ON CALL CATH LAB
INTRAVENOUS | Status: AC
  Filled 2014-02-01: qty 1

## 2014-02-01 MED ORDER — FENTANYL CITRATE 0.05 MG/ML IJ SOLN
INTRAMUSCULAR | Status: AC
  Filled 2014-02-01: qty 2

## 2014-02-01 MED ORDER — SODIUM CHLORIDE 0.9 % IJ SOLN: 3.0000 mL | Freq: Two times a day (BID) | INTRAMUSCULAR | Status: DC

## 2014-02-01 MED ORDER — HEPARIN (PORCINE) IN NACL 2-0.9 UNIT/ML-% IJ SOLN
INTRAMUSCULAR | Status: AC
  Filled 2014-02-01: qty 1000

## 2014-02-01 MED ORDER — SODIUM CHLORIDE 0.9 % IV SOLN: INTRAVENOUS | Status: DC

## 2014-02-01 MED ORDER — RIVAROXABAN 20 MG PO TABS: 20.0000 mg | ORAL_TABLET | Freq: Every day | ORAL | Status: DC

## 2014-02-01 MED ORDER — SODIUM CHLORIDE 0.9 % IV SOLN: 250.0000 mL | INTRAVENOUS | Status: DC | PRN

## 2014-02-01 MED ORDER — ASPIRIN 81 MG PO CHEW
CHEWABLE_TABLET | ORAL | Status: AC
  Filled 2014-02-01: qty 1

## 2014-02-01 MED ORDER — SODIUM CHLORIDE 0.9 % IJ SOLN: 3.0000 mL | INTRAMUSCULAR | Status: DC | PRN

## 2014-02-01 MED ORDER — SODIUM CHLORIDE 0.9 % IV SOLN: 1.0000 mL/kg/h | INTRAVENOUS | Status: DC

## 2014-02-01 MED ORDER — LIDOCAINE HCL (PF) 1 % IJ SOLN
INTRAMUSCULAR | Status: AC
  Filled 2014-02-01: qty 30

## 2014-02-01 NOTE — H&P (Signed)
[address] [place]  [place], [place] [address]  Phone: [phone]  Fax: [phone]   ID: [name], [name] 06/16/1946, MRN [id number]  PCP: Nyoka Cowden, MD  Cardiologist: [name], MD  History of Present Illness:  Cynthia Herrera is a 68 y.o. 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 She was also in atrial fibrillation with RVR. She was diuresed and ruled out for MI. Echo showed EF 15-20%. She was started on Amiodarone and on 11/12/2013 underwent TEE/DCCV to NSR but failed to hold NSR. She was again cardioverted on 1/21 after more loading with amio but failed to convert. Later that day she spontaneously converted to NSR. She now presents back today for followup. She denies any SOB although she has not been very active. She denies any chest pain, LE edema, palpitations or syncope.  Wt Readings from Last 3 Encounters:   01/03/14  240 lb (108.863 kg)   11/23/13  237 lb (107.502 kg)   11/16/13  244 lb 3.2 oz (110.768 kg)    Past Medical History   Diagnosis  Date   .  Personal history of colonic polyps      adenomas 03 and 08   .  Hyperlipidemia    .  Hypertension    .  Osteopenia    .  Vitamin D deficiency    .  Allergy      seasonal   .  Arthritis    .  Breast cancer      left: surgery,chemo,radiation   .  COPD (chronic obstructive pulmonary disease)    .  Atrial fibrillation      s/p TEE DCCV and repeat DCCV 11/14/2013   .  DCM (dilated cardiomyopathy)      EF 15-20% ? tachycardia induced   .  Chronic systolic CHF (congestive heart failure)     Current Outpatient Prescriptions   Medication  Sig  Dispense  Refill   .  acetaminophen (TYLENOL) 325 MG tablet  Take 2 tablets (650 mg total) by mouth every 4 (four) hours as needed for headache or mild pain.     Marland Kitchen  amiodarone (PACERONE) 200 MG tablet  Take 2 tablets  twice a day x 1 week; then 2 tablets daily x 1 week;then repeat lab  100 tablet  3   .  atorvastatin (LIPITOR) 20 MG tablet  Take 1 tablet (20 mg total) by mouth at bedtime.  30 tablet  5   .  calcium carbonate (OS-CAL) 600 MG TABS  Take 600 mg by mouth daily with breakfast. 2 dialy     .  Cholecalciferol (VITAMIN D) 2000 UNITS tablet  Take 2,000 Units by mouth daily.     .  cyclobenzaprine (FLEXERIL) 10 MG tablet  Take 1 tablet (10 mg total) by mouth 3 (three) times daily as needed for muscle spasms.  30 tablet  2   .  digoxin (LANOXIN) 0.125 MG tablet  Take 1 tablet (0.125 mg total) by mouth daily.  30 tablet  5   .  fluticasone (FLONASE) 50 MCG/ACT nasal spray  Place 1 spray into both nostrils daily as needed for allergies.     .  furosemide (LASIX) 20 MG tablet  Take 3 tablets (60 mg total) by mouth 2 (two) times daily.  100 tablet  5   .  Hydrocodone-Acetaminophen 5-300  MG TABS  Take 1 tablet by mouth every 6 (six) hours as needed (pain).     Marland Kitchen  losartan (COZAAR) 25 MG tablet  Take 1 tablet (25 mg total) by mouth 2 (two) times daily.  60 tablet  5   .  metoprolol succinate (TOPROL-XL) 25 MG 24 hr tablet  Take 1 tablet (25 mg total) by mouth 2 (two) times daily.  60 tablet  5   .  Multiple Vitamin (MULTIVITAMIN) tablet  Take 1 tablet by mouth daily.     .  nitroGLYCERIN (NITROSTAT) 0.4 MG SL tablet  Place 1 tablet (0.4 mg total) under the tongue every 5 (five) minutes x 3 doses as needed for chest pain.  25 tablet  2   .  potassium chloride SA (K-DUR,KLOR-CON) 20 MEQ tablet  Take 1 tablet (20 mEq total) by mouth 2 (two) times daily.  60 tablet  11   .  Rivaroxaban (XARELTO) 20 MG TABS tablet  Take 1 tablet (20 mg total) by mouth daily with supper.  30 tablet  5   .  spironolactone (ALDACTONE) 25 MG tablet  Take 1 tablet (25 mg total) by mouth daily.  30 tablet  5    No current facility-administered medications for this visit.    Allergies:  Allergies   Allergen  Reactions   .  Codeine  Phosphate  Other (See Comments)     Weird feeling "    Social History: The patient reports that she quit smoking about 12 years ago. She has never used smokeless tobacco. She reports that she does not drink alcohol or use illicit drugs.  Family History: The patient's family history includes Cancer in her mother; Dementia in her sister; Diabetes in her brother, father, mother, and sister; Heart disease in her brother and father; Liver cancer in her mother.  ROS: Please see the history of present illness. All other systems reviewed and negative.  PHYSICAL EXAM:  VS: BP 110/52  Pulse 80  Ht 5\' 6"  (1.676 m)  Wt 240 lb (108.863 kg)  BMI 38.76 kg/m2  Well nourished, well developed, in no acute distress  HEENT: normal  Neck: no JVD  Cardiac: normal S1, S2; RRR; no murmur  Lungs: clear to auscultation bilaterally, no wheezing, rhonchi or rales  Abd: soft, nontender, no hepatomegaly  Ext: no edema  Skin: warm and dry  Neuro: CNs 2-12 intact, no focal abnormalities noted  EKG: NSR at 67bpm with nonspecific ST abnormality QTc 4108msec  ASSESSMENT AND PLAN:  1. Atrial fibrillation s/p recent TEE/DCCV then reversion back to afib and now back in NSR.  - She will continue on metoprolol/digoxin/amiodarone and Xarelto.  2. Chronic systolic CHF - she appears euvolemic today. Her ideal dry weight is between 235- 240  - continue Lasix/Aldactone/beta blocker/dig/ARB  3. CM EF 15-20% ? Tachycardia induced.  -I will repeat an echo now that she is rate controlled. If LVF is still reduced then will plan right and left heart cath. If normalized then plan for Lexiscan CL to rule out ischemia given coronary artery calcifications on chest CT  4. HTN - well controlled  - continue metoprolol/Losartan  5. 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 Dyslipidemia  7. Moderate MR most likely due to LV dysfunction  Followup with me in 3 months  [name], MD

## 2014-02-01 NOTE — CV Procedure (Signed)
Cardiac Cath Procedure Note  Indication: CHF with dilated CM and pulmonary HTN  Procedures performed:  1) Right heart cathererization 2) Selective coronary angiography 3) Left heart catheterization 4) Left ventriculogram  Description of procedure:     The risks and indication of the procedure were explained. Consent was signed and placed on the chart. An appropriate timeout was taken prior to the procedure. The right groin was prepped and draped in the routine sterile fashion and anesthetized with 1% local lidocaine.   A 5 FR arterial sheath was placed in the right femoral artery using a modified Seldinger technique. Standard catheters including a JL4, JR4 and angled pigtail were used. All catheter exchanges were made over a wire. A 7 FR venous sheath was placed in the right femoral vein using a modified Seldinger technique. A standard Swan-Ganz catheter was used for the procedure.   Complications:  None apparent  Findings:  RA = 8/5 with mean 64mmHg RV = 29/3 with mean 71mmHg PA = 30/12 with mean 53mmHg PCW = 9/6 with mean 70mmHg Fick cardiac output/index = 5.76/2.72 Thermal cardiac output/index = 5.93/2.72 PVR =175 D/S, 372 D/DI FA sat = 93% PA sat = 68% RA sat = 64%  Ao Pressure:124/48mmHg LV Pressure: 124/43mmHg LVEDP: 10mmHg There was no signficant gradient across the aortic valve on pullback.  Left main:calcified but widely patent and bifurcates into an LAD and left circumflex  RSW:NIOEVOJJK but widely patent throughout its course to the apex.  It gives rise to a large bifurcating diagonal which is widely patent.  The LAD then gives rise to a second moderate sized diagonal which is patent.    KXF:GHWEXHBZJ but widely patent and traverses the AV groove.  It gives rise to a large OM which bifurcates into 2 daughter branches which are widely patent.   IRC:VELFYB patent and gives rise to 2 acute RV marginal branches which are patent.  Distally it bifurcates into a PDA and PL  branches which are patent.    LV-gram done in the RAO projection: Ejection fraction = 45%  Assessment: 1.  Calcified vessels which are widely patent with no obstructive lesions 2.  Mildly LV dysfunction EF 45% 3.  Normal right heart pressures   Plan/Discussion: 1.  Continue current meds 2.  Restart Xarelto in am 3.  D/C home when IVF and bedrest complete   Sueanne Margarita, MD 8:31 AM

## 2014-02-01 NOTE — Discharge Instructions (Signed)

## 2014-02-01 NOTE — Interval H&P Note (Signed)
History and Physical Interval Note:  02/01/2014 6:28 AM  Cynthia Herrera  has presented today for surgery, with the diagnosis of low ef  The various methods of treatment have been discussed with the patient and family. After consideration of risks, benefits and other options for treatment, the patient has consented to  Procedure(s): LEFT AND RIGHT HEART CATHETERIZATION WITH CORONARY ANGIOGRAM (N/A) as a surgical intervention .  The patient's history has been reviewed, patient examined, no change in status, stable for surgery.  I have reviewed the patient's chart and labs.  Questions were answered to the patient's satisfaction.     Sueanne Margarita

## 2014-02-06 ENCOUNTER — Telehealth: Payer: Self-pay | Admitting: Cardiology

## 2014-02-06 NOTE — Telephone Encounter (Signed)
Patient would like to know if it is okay to proceed with back surgery? Please call and advise.

## 2014-02-06 NOTE — Telephone Encounter (Signed)
TO Dr Radford Pax to advise for pt.

## 2014-02-06 NOTE — Telephone Encounter (Signed)
Pt is unsure of the exact procedure they would be doing. She is going to see her surgeon tomorrow and will have more information for [place] then

## 2014-02-06 NOTE — Telephone Encounter (Signed)
Recent cath showed calcified coronary arteries but widely patent with mildly reduced LVF EF 45% by recent cath.  Patient is low risk from a cardiac standpoint for surgery

## 2014-02-06 NOTE — Telephone Encounter (Signed)
Pt is aware. She will have surgeon send over any papers that might need to be signed

## 2014-02-27 ENCOUNTER — Other Ambulatory Visit: Payer: Self-pay | Admitting: *Deleted

## 2014-02-27 MED ORDER — FUROSEMIDE 20 MG PO TABS: 60.0000 mg | ORAL_TABLET | Freq: Two times a day (BID) | ORAL | Status: DC

## 2014-03-01 ENCOUNTER — Other Ambulatory Visit: Payer: Self-pay | Admitting: Neurological Surgery

## 2014-03-08 ENCOUNTER — Encounter (HOSPITAL_COMMUNITY): Payer: Self-pay | Admitting: Pharmacy Technician

## 2014-03-11 ENCOUNTER — Encounter (HOSPITAL_COMMUNITY)
Admission: RE | Admit: 2014-03-11 | Discharge: 2014-03-11 | Disposition: A | Discharge status: Home / Self Care | Payer: Medicare Other | Source: Ambulatory Visit | Attending: Neurological Surgery | Admitting: Neurological Surgery

## 2014-03-11 ENCOUNTER — Encounter (HOSPITAL_COMMUNITY): Payer: Self-pay

## 2014-03-11 ENCOUNTER — Ambulatory Visit (HOSPITAL_COMMUNITY)
Admission: RE | Admit: 2014-03-11 | Discharge: 2014-03-11 | Disposition: A | Discharge status: Home / Self Care | Payer: Medicare Other | Source: Ambulatory Visit | Attending: Anesthesiology | Admitting: Anesthesiology

## 2014-03-11 DIAGNOSIS — I499 Cardiac arrhythmia, unspecified: Secondary | ICD-10-CM | POA: Insufficient documentation

## 2014-03-11 DIAGNOSIS — E785 Hyperlipidemia, unspecified: Secondary | ICD-10-CM | POA: Insufficient documentation

## 2014-03-11 DIAGNOSIS — I428 Other cardiomyopathies: Secondary | ICD-10-CM | POA: Insufficient documentation

## 2014-03-11 DIAGNOSIS — I509 Heart failure, unspecified: Secondary | ICD-10-CM | POA: Insufficient documentation

## 2014-03-11 DIAGNOSIS — Z9010 Acquired absence of unspecified breast and nipple: Secondary | ICD-10-CM | POA: Insufficient documentation

## 2014-03-11 DIAGNOSIS — Z923 Personal history of irradiation: Secondary | ICD-10-CM | POA: Insufficient documentation

## 2014-03-11 DIAGNOSIS — I5022 Chronic systolic (congestive) heart failure: Secondary | ICD-10-CM | POA: Insufficient documentation

## 2014-03-11 DIAGNOSIS — Z87891 Personal history of nicotine dependence: Secondary | ICD-10-CM | POA: Insufficient documentation

## 2014-03-11 DIAGNOSIS — J4489 Other specified chronic obstructive pulmonary disease: Secondary | ICD-10-CM | POA: Insufficient documentation

## 2014-03-11 DIAGNOSIS — Z853 Personal history of malignant neoplasm of breast: Secondary | ICD-10-CM | POA: Insufficient documentation

## 2014-03-11 DIAGNOSIS — R7309 Other abnormal glucose: Secondary | ICD-10-CM | POA: Insufficient documentation

## 2014-03-11 DIAGNOSIS — J449 Chronic obstructive pulmonary disease, unspecified: Secondary | ICD-10-CM | POA: Insufficient documentation

## 2014-03-11 DIAGNOSIS — Z96659 Presence of unspecified artificial knee joint: Secondary | ICD-10-CM | POA: Insufficient documentation

## 2014-03-11 DIAGNOSIS — Z01812 Encounter for preprocedural laboratory examination: Secondary | ICD-10-CM | POA: Insufficient documentation

## 2014-03-11 DIAGNOSIS — Z01818 Encounter for other preprocedural examination: Secondary | ICD-10-CM | POA: Insufficient documentation

## 2014-03-11 DIAGNOSIS — I4891 Unspecified atrial fibrillation: Secondary | ICD-10-CM | POA: Insufficient documentation

## 2014-03-11 DIAGNOSIS — I10 Essential (primary) hypertension: Secondary | ICD-10-CM | POA: Insufficient documentation

## 2014-03-11 DIAGNOSIS — K219 Gastro-esophageal reflux disease without esophagitis: Secondary | ICD-10-CM | POA: Insufficient documentation

## 2014-03-11 HISTORY — DX: Pain in unspecified joint: M25.50

## 2014-03-11 HISTORY — DX: Personal history of other diseases of the respiratory system: Z87.09

## 2014-03-11 HISTORY — DX: Nocturia: R35.1

## 2014-03-11 HISTORY — DX: Other muscle spasm: M62.838

## 2014-03-11 HISTORY — DX: Other chronic pain: G89.29

## 2014-03-11 HISTORY — DX: Dorsalgia, unspecified: M54.9

## 2014-03-11 HISTORY — DX: Gastro-esophageal reflux disease without esophagitis: K21.9

## 2014-03-11 LAB — BASIC METABOLIC PANEL
BUN: 17 mg/dL (ref 6–23)
CALCIUM: 9.6 mg/dL (ref 8.4–10.5)
CO2: 24 mEq/L (ref 19–32)
Chloride: 98 mEq/L (ref 96–112)
Creatinine, Ser: 1.02 mg/dL (ref 0.50–1.10)
GFR, EST AFRICAN AMERICAN: 64 mL/min — AB (ref >90)
GFR, EST NON AFRICAN AMERICAN: 55 mL/min — AB (ref >90)
Glucose, Bld: 240 mg/dL — ABNORMAL HIGH (ref 70–99)
POTASSIUM: 4.2 meq/L (ref 3.7–5.3)
SODIUM: 136 meq/L — AB (ref 137–147)

## 2014-03-11 LAB — CBC
HCT: 37.1 % (ref 36.0–46.0)
HEMOGLOBIN: 12.5 g/dL (ref 12.0–15.0)
MCH: 30.9 pg (ref 26.0–34.0)
MCHC: 33.7 g/dL (ref 30.0–36.0)
MCV: 91.8 fL (ref 78.0–100.0)
Platelets: 344 10*3/uL (ref 150–400)
RBC: 4.04 MIL/uL (ref 3.87–5.11)
RDW: 14.5 % (ref 11.5–15.5)
WBC: 7.1 10*3/uL (ref 4.0–10.5)

## 2014-03-11 LAB — LIPASE, BLOOD: LIPASE: 13 U/L (ref 11–59)

## 2014-03-11 LAB — SURGICAL PCR SCREEN
MRSA, PCR: NEGATIVE
STAPHYLOCOCCUS AUREUS: NEGATIVE

## 2014-03-11 NOTE — Progress Notes (Addendum)
[name] is Cardiologist  Echo reports in epic from 2014 and 2015  Heart cath in epic from 2015  EKG in epic from 01-03-14  Medical Md is Dr. Bluford Kaufmann  Denies 2 view being done in past yr

## 2014-03-11 NOTE — Progress Notes (Signed)
Called to Dr.Traci Turner to find out when she wants pt to hold Xarelto

## 2014-03-11 NOTE — Pre-Procedure Instructions (Signed)
NYLAN NAKATANI  03/11/2014   Your procedure is scheduled on:  Tues, May 26 @ 7:30 AM  Report to [facility]  at 5:30 AM.  Call this number if you have problems the morning of surgery: [phone]   Remember:   Do not eat food or drink liquids after midnight.   Take these medicines the morning of surgery with A SIP OF WATER: Amiodarone(Pacerone),Digoxin(Lanoxin),Flonase(Fluticasone-if needed),Pain Pill(if needed),and Metoprolol(Toprol)              No Goody's,BC's,Aleve,Aspirin,Ibuprofen,Fish Oil,or any Herbal Medications.Stop taking your Xarelto as you have been instructed.      Do not wear jewelry, make-up or nail polish.  Do not wear lotions, powders, or perfumes. You may wear deodorant.  Do not shave 48 hours prior to surgery.   Do not bring valuables to the hospital.  [facility] is not responsible                  for any belongings or valuables.               Contacts, dentures or bridgework may not be worn into surgery.  Leave suitcase in the car. After surgery it may be brought to your room.  For patients admitted to the hospital, discharge time is determined by your                treatment team.               Patients discharged the day of surgery will not be allowed to drive  home.    Special Instructions:  [facility] - Preparing for Surgery  Before surgery, you can play an important role.  Because skin is not sterile, your skin needs to be as free of germs as possible.  You can reduce the number of germs on you skin by washing with CHG (chlorahexidine gluconate) soap before surgery.  CHG is an antiseptic cleaner which kills germs and bonds with the skin to continue killing germs even after washing.  Please DO NOT use if you have an allergy to CHG or antibacterial soaps.  If your skin becomes reddened/irritated stop using the CHG and inform your nurse when you arrive at Short Stay.  Do not shave (including legs and underarms) for at least 48 hours prior to the  first CHG shower.  You may shave your face.  Please follow these instructions carefully:   1.  Shower with CHG Soap the night before surgery and the                                morning of Surgery.  2.  If you choose to wash your hair, wash your hair first as usual with your       normal shampoo.  3.  After you shampoo, rinse your hair and body thoroughly to remove the                      Shampoo.  4.  Use CHG as you would any other liquid soap.  You can apply chg directly       to the skin and wash gently with scrungie or a clean washcloth.  5.  Apply the CHG Soap to your body ONLY FROM THE NECK DOWN.        Do not use on open wounds or open sores.  Avoid contact with your  eyes,       ears, mouth and genitals (private parts).  Wash genitals (private parts)       with your normal soap.  6.  Wash thoroughly, paying special attention to the area where your surgery        will be performed.  7.  Thoroughly rinse your body with warm water from the neck down.  8.  DO NOT shower/wash with your normal soap after using and rinsing off       the CHG Soap.  9.  Pat yourself dry with a clean towel.            10.  Wear clean pajamas.            11.  Place clean sheets on your bed the night of your first shower and do not        sleep with pets.  Day of Surgery  Do not apply any lotions/deoderants the morning of surgery.  Please wear clean clothes to the hospital/surgery center.     Please read over the following fact sheets that you were given: Pain Booklet, Coughing and Deep Breathing, MRSA Information and Surgical Site Infection Prevention

## 2014-03-12 ENCOUNTER — Telehealth: Payer: Self-pay | Admitting: Internal Medicine

## 2014-03-12 NOTE — Telephone Encounter (Signed)
Recent cath showed calcified coronary arteries but widely patent with mildly reduced LVF EF 45% by recent cath. Patient is low risk from a cardiac standpoint for surgery  Per Dr Radford Pax last time pt asked about sx.

## 2014-03-12 NOTE — Progress Notes (Addendum)
Anesthesia chart review: Patient is a 68 year old female scheduled for L1-2, L2-3, L3-4, L4-5 laminectomy/foraminotomy on 03/19/14 by Dr. Ellene Route.   History includes former smoker, dilated cardiomyopathy (tachy induced?), chronic systolic CHF 01/2594, afib s/p failed DCCV X 2 with later spontaneous conversion to NSR 10/2013, COPD, GERD, HTN, left breast cancer status post mastectomy and chemoradiation '02, arthritis, HLD, TKR '06. BMI is consistent with obesity.  PCP is Dr. Bluford Kaufmann.  Cardiologist is Dr. [name] who felt she was a low risk for surgery from a cardiac standpoint.  Patient's PAT RN already left a message for Dr. Radford Pax regarding when patient can hold Xarelto. Dr. Clarice Pole office has instructed patient to hold 3-5 days preoperatively.  Cardiac cath on 02/01/14 showed: 1. Calcified vessels which are widely patent with no obstructive lesions  2. Mildly LV dysfunction EF 45%  3. Normal right heart pressures  REC: Continue current medications.  Echo on 01/21/14 showed: - Left ventricle: The cavity size was mildly dilated. Wall thickness was increased in a pattern of mild LVH. Systolic function was severely reduced. The estimated ejection fraction was 30%, in the range of 25% to 30%. Diffuse hypokinesis. - Mitral valve: Calcified annulus. Mildly thickened leaflets. Mild regurgitation. - Left atrium: The atrium was mildly dilated. Impressions: Limited study to assess LV function; all views and full doppler not obtained. Compared to 11/10/13, LV function is mildly improved but remains severely depressed; RV function now normal. (TEE on 11/12/13 showed LVEF 15-20%, mild AR, moderate MR, mild to moderate TR, no LV or LA thrombus, but spontaneous echo contrast "smoke" in LA.)  EKG on 01/03/14 showed NSR, non-specific ST/T wave abnormality.  CXR on 03/11/14 showed: There is no evidence of pneumonia nor CHF nor other active cardiopulmonary disease.   Preoperative labs noted.  Glucose is  240.  She did not report a history of DM, but has had glucose readings in Epic of 122-343 since 10/2013, with A1C of 6.5 on 11/09/13. Patient has not had follow-up with her PCP since her 10/2013 discharge. No history of DM was reported or documented in her history. I reviewed with anesthesiologist Dr. Linna Caprice who agrees that she will need pre-operative input from her PCP regarding hyperglycemia, possible undiagnosed DM2.  Jessica at Dr. Clarice Pole office is aware.  She has contacted the patient and is contacting Dr. Truddie Hidden office.  If her PCP feels she can proceed as planned, she will need a fasting CBG on arrival and perioperative CBG monitoring.  [facility]/Anesthesiology Phone [phone] 03/12/2014 4:51 PM  Addendum: 03/15/2014 12:26 PM Patient was seen by Dr. Burnice Logan this morning for pre-operative evaluation for hyperglycemia. He is starting her on a low dose basal insulin (given Levemir samples) for now with scheduled surgery with recommendations for post-operative medical consult.  He felt that post-operatively she could likely be started on oral DM medications. He felt there were no contraindications to anticipated surgery next week.

## 2014-03-12 NOTE — Telephone Encounter (Signed)
Pt is aware she is cleared and she will contact the surgeon and make them aware the clearance is in EPIC

## 2014-03-12 NOTE — Telephone Encounter (Signed)
New message    Patient calling regarding cardiac clearance.   Surgery on  5/26

## 2014-03-13 ENCOUNTER — Telehealth: Payer: Self-pay | Admitting: Internal Medicine

## 2014-03-13 NOTE — Telephone Encounter (Signed)
Pt states she is having back surgery tues of next week.pt went for pre-op and her sugar was 240. Pt states dr Roselee Culver office was to get in touch w/ our office and cb pt to advise pt if she needs to do anything. Pt has not heard from anyone . pls advisd

## 2014-03-14 NOTE — Telephone Encounter (Signed)
Spoke with patient and an appointment made 

## 2014-03-15 ENCOUNTER — Encounter: Payer: Self-pay | Admitting: Internal Medicine

## 2014-03-15 ENCOUNTER — Ambulatory Visit (INDEPENDENT_AMBULATORY_CARE_PROVIDER_SITE_OTHER): Payer: Medicare Other | Admitting: Internal Medicine

## 2014-03-15 VITALS — BP 114/70 | HR 68 | Temp 98.0°F | Wt 238.0 lb

## 2014-03-15 DIAGNOSIS — E785 Hyperlipidemia, unspecified: Secondary | ICD-10-CM

## 2014-03-15 DIAGNOSIS — M549 Dorsalgia, unspecified: Secondary | ICD-10-CM

## 2014-03-15 DIAGNOSIS — I4891 Unspecified atrial fibrillation: Secondary | ICD-10-CM

## 2014-03-15 DIAGNOSIS — I10 Essential (primary) hypertension: Secondary | ICD-10-CM

## 2014-03-15 DIAGNOSIS — E119 Type 2 diabetes mellitus without complications: Secondary | ICD-10-CM | POA: Insufficient documentation

## 2014-03-15 MED ORDER — INSULIN DETEMIR 100 UNIT/ML ~~LOC~~ SOLN: 10.0000 [IU] | Freq: Every day | SUBCUTANEOUS | Status: DC

## 2014-03-15 MED ORDER — INSULIN PEN NEEDLE 31G X 5 MM MISC: 1.0000 | Freq: Every day | Status: DC

## 2014-03-15 NOTE — Patient Instructions (Addendum)
Levimir insulin 10 units at bedtime Monitor home blood pressure readings  Office reevaluation postoperatively, when able  Hold XareltoGlucose, Blood Sugar, Fasting Blood Sugar This is a test to measure your blood sugar. Glucose is a simple sugar that serves as the main source of energy for the body. The carbohydrates we eat are broken down into glucose (and a few other simple sugars), absorbed by the small intestine, and circulated throughout the body. Most of the body's cells require glucose for energy production; brain and nervous system cells not only rely on glucose for energy, they can only function when glucose levels in the blood remain above a certain level.  The body's use of glucose hinges on the availability of insulin, a hormone produced by the pancreas. Insulin acts as a Control and instrumentation engineer, transporting glucose into the body's cells, directing the body to store excess glucose as glycogen (for short-term storage) and/or as triglycerides in fat cells. We can not live without glucose or insulin, and they must be in balance.  Normally, blood glucose levels rise slightly after a meal, and insulin is secreted to lower them, with the amount of insulin released matched up with the size and content of the meal. If blood glucose levels drop too low, such as might occur in between meals or after a strenuous workout, glucagon (another pancreatic hormone) is secreted to tell the liver to turn some glycogen back into glucose, raising the blood glucose levels. If the glucose/insulin feedback mechanism is working properly, the amount of glucose in the blood remains fairly stable. If the balance is disrupted and glucose levels in the blood rise, then the body tries to restore the balance, both by increasing insulin production and by excreting glucose in the urine.  PREPARATION FOR TEST A blood sample drawn from a vein in your arm or, for a self check, a drop of blood from a skin prick; in general, it may be  recommended that you fast before having a blood glucose test; sometimes a random (no preparation) urine sample is used. Your caregiver will instruct you as to what they want prior to your testing. NORMAL FINDINGS Normal values depend on many factors. Your lab will provide a range of normal values with your test results. The following information summarizes the meaning of the test results. These are based on the clinical practice recommendations of the American Diabetes Association.  FASTING BLOOD GLUCOSE  From 70 to 99 mg/dL (3.9 to 5.5 mmol/L): Normal glucose tolerance  From 100 to 125 mg/dL (5.6 to 6.9 mmol/L):Impaired fasting glucose (pre-diabetes)  126 mg/dL (7.0 mmol/L) and above on more than one testing occasion: Diabetes ORAL GLUCOSE TOLERANCE TEST (OGTT) [EXCEPT PREGNANCY] (2 HOURS AFTER A 75-GRAM GLUCOSE DRINK)  Less than 140 mg/dL (7.8 mmol/L): Normal glucose tolerance  From 140 to 200 mg/dL (7.8 to 11.1 mmol/L): Impaired glucose tolerance (pre-diabetes)  Over 200 mg/dL (11.1 mmol/L) on more than one testing occasion: Diabetes GESTATIONAL DIABETES SCREENING: GLUCOSE CHALLENGE TEST (1 HOUR AFTER A 50-GRAM GLUCOSE DRINK)  Less than 140* mg/dL (7.8 mmol/L): Normal glucose tolerance  140* mg/dL (7.8 mmol/L) and over: Abnormal, needs OGTT (see below) * Some use a cutoff of More Than 130 mg/dL (7.2 mmol/L) because that identifies 90% of women with gestational diabetes, compared to 80% identified using the threshold of More Than 140 mg/dL (7.8 mmol/L). GESTATIONAL DIABETES DIAGNOSTIC: OGTT (100-GRAM GLUCOSE DRINK)  Fasting*..........................................95 mg/dL (5.3 mmol/L)  1 hour after glucose load*..............180 mg/dL (10.0 mmol/L)  2 hours after glucose load*.............[place]  mg/dL (8.6 mmol/L)  3 hours after glucose load* **.........140 mg/dL (7.8 mmol/L) * If two or more values are above the criteria, gestational diabetes is diagnosed. ** A 75-gram glucose load  may be used, although this method is not as well validated as the 100-gram OGTT; the 3-hour sample is not drawn if 75 grams is used.  Ranges for normal findings may vary among different laboratories and hospitals. You should always check with your doctor after having lab work or other tests done to discuss the meaning of your test results and whether your values are considered within normal limits. MEANING OF TEST  Your caregiver will go over the test results with you and discuss the importance and meaning of your results, as well as treatment options and the need for additional tests if necessary. OBTAINING THE TEST RESULTS It is your responsibility to obtain your test results. Ask the lab or department performing the test when and how you will get your results. Document Released: 11/12/2004 Document Revised: 01/03/2012 Document Reviewed: 09/21/2008 [facility] Patient Information 2014 [place], [place]. Diabetes Meal Planning Guide The diabetes meal planning guide is a tool to help you plan your meals and snacks. It is important for people with diabetes to manage their blood glucose (sugar) levels. Choosing the right foods and the right amounts throughout your day will help control your blood glucose. Eating right can even help you improve your blood pressure and reach or maintain a healthy weight. CARBOHYDRATE COUNTING MADE EASY When you eat carbohydrates, they turn to sugar. This raises your blood glucose level. Counting carbohydrates can help you control this level so you feel better. When you plan your meals by counting carbohydrates, you can have more flexibility in what you eat and balance your medicine with your food intake. Carbohydrate counting simply means adding up the total amount of carbohydrate grams in your meals and snacks. Try to eat about the same amount at each meal. Foods with carbohydrates are listed below. Each portion below is 1 carbohydrate serving or 15 grams of carbohydrates. Ask  your dietician how many grams of carbohydrates you should eat at each meal or snack. Grains and Starches  1 slice bread.   English muffin or hotdog/hamburger bun.   cup cold cereal (unsweetened).   cup cooked pasta or rice.   cup starchy vegetables (corn, potatoes, peas, beans, winter squash).  1 tortilla (6 inches).   bagel.  1 waffle or pancake (size of a CD).   cup cooked cereal.  4 to 6 small crackers. *Whole grain is recommended. Fruit  1 cup fresh unsweetened berries, melon, papaya, pineapple.  1 small fresh fruit.   banana or mango.   cup fruit juice (4 oz unsweetened).   cup canned fruit in natural juice or water.  2 tbs dried fruit.  12 to 15 grapes or cherries. Milk and Yogurt  1 cup fat-free or 1% milk.  1 cup soy milk.  6 oz light yogurt with sugar-free sweetener.  6 oz low-fat soy yogurt.  6 oz plain yogurt. Vegetables  1 cup raw or  cup cooked is counted as 0 carbohydrates or a "free" food.  If you eat 3 or more servings at 1 meal, count them as 1 carbohydrate serving. Other Carbohydrates   oz chips or pretzels.   cup ice cream or frozen yogurt.   cup sherbet or sorbet.  2 inch square cake, no frosting.  1 tbs honey, sugar, jam, jelly, or syrup.  2 small cookies.  3 squares of graham crackers.  3 cups popcorn.  6 crackers.  1 cup broth-based soup.  Count 1 cup casserole or other mixed foods as 2 carbohydrate servings.  Foods with less than 20 calories in a serving may be counted as 0 carbohydrates or a "free" food. You may want to purchase a book or computer software that lists the carbohydrate gram counts of different foods. In addition, the nutrition facts panel on the labels of the foods you eat are a good source of this information. The label will tell you how big the serving size is and the total number of carbohydrate grams you will be eating per serving. Divide this number by 15 to obtain the number of  carbohydrate servings in a portion. Remember, 1 carbohydrate serving equals 15 grams of carbohydrate. SERVING SIZES Measuring foods and serving sizes helps you make sure you are getting the right amount of food. The list below tells how big or small some common serving sizes are.  1 oz.........4 stacked dice.  3 oz........Marland KitchenDeck of cards.  1 tsp.......Marland KitchenTip of little finger.  1 tbs......Marland KitchenMarland KitchenThumb.  2 tbs.......Marland KitchenGolf ball.   cup......Marland KitchenHalf of a fist.  1 cup.......Marland KitchenA fist. SAMPLE DIABETES MEAL PLAN Below is a sample meal plan that includes foods from the grain and starches, dairy, vegetable, fruit, and meat groups. A dietician can individualize a meal plan to fit your calorie needs and tell you the number of servings needed from each food group. However, controlling the total amount of carbohydrates in your meal or snack is more important than making sure you include all of the food groups at every meal. You may interchange carbohydrate containing foods (dairy, starches, and fruits). The meal plan below is an example of a 2000 calorie diet using carbohydrate counting. This meal plan has 17 carbohydrate servings. Breakfast  1 cup oatmeal (2 carb servings).   cup light yogurt (1 carb serving).  1 cup blueberries (1 carb serving).   cup almonds. Snack  1 large apple (2 carb servings).  1 low-fat string cheese stick. Lunch  Chicken breast salad.  1 cup spinach.   cup chopped tomatoes.  2 oz chicken breast, sliced.  2 tbs low-fat [place] dressing.  12 whole-wheat crackers (2 carb servings).  12 to 15 grapes (1 carb serving).  1 cup low-fat milk (1 carb serving). Snack  1 cup carrots.   cup hummus (1 carb serving). Dinner  3 oz broiled salmon.  1 cup brown rice (3 carb servings). Snack  1  cups steamed broccoli (1 carb serving) drizzled with 1 tsp olive oil and lemon juice.  1 cup light pudding (2 carb servings). DIABETES MEAL PLANNING WORKSHEET Your  dietician can use this worksheet to help you decide how many servings of foods and what types of foods are right for you.  BREAKFAST Food Group and Servings / Carb Servings Grain/Starches __________________________________ Dairy __________________________________________ Vegetable ______________________________________ Fruit ___________________________________________ Meat __________________________________________ Fat ____________________________________________ LUNCH Food Group and Servings / Carb Servings Grain/Starches ___________________________________ Dairy ___________________________________________ Fruit ____________________________________________ Meat ___________________________________________ Fat _____________________________________________ Wonda Cheng Food Group and Servings / Carb Servings Grain/Starches ___________________________________ Dairy ___________________________________________ Fruit ____________________________________________ Meat ___________________________________________ Fat _____________________________________________ SNACKS Food Group and Servings / Carb Servings Grain/Starches ___________________________________ Dairy ___________________________________________ Vegetable _______________________________________ Fruit ____________________________________________ Meat ___________________________________________ Fat _____________________________________________ DAILY TOTALS Starches _________________________ Vegetable ________________________ Fruit ____________________________ Dairy ____________________________ Meat ____________________________ Fat ______________________________ Document Released: 07/08/2005 Document Revised: 01/03/2012 Document Reviewed: 05/19/2009 [employer] Patient Information 2014 [employer]. Diabetes, Eating Away From Home Sometimes, you might eat in  a restaurant or have meals that are prepared by someone else. You can enjoy  eating out. However, the portions in restaurants may be much larger than needed. Listed below are some ideas to help you choose foods that will keep your blood glucose (sugar) in better control.  TIPS FOR EATING OUT  Know your meal plan and how many carbohydrate servings you should have at each meal. You may wish to carry a copy of your meal plan in your purse or wallet. Learn the foods included in each food group.  Make a list of restaurants near you that offer healthy choices. Take a copy of the carry-out menus to see what they offer. Then, you can plan what you will order ahead of time.  Become familiar with serving sizes by practicing them at home using measuring cups and spoons. Once you learn to recognize portion sizes, you will be able to correctly estimate the amount of total carbohydrate you are allowed to eat at the restaurant. Ask for a takeout box if the portion is more than you should have. When your food comes, leave the amount you should have on the plate, and put the rest in the takeout box before you start eating.  Plan ahead if your mealtime will be different from usual. Check with your caregiver to find out how to time meals and medicine if you are taking insulin.  Avoid high-fat foods, such as fried foods, cream sauces, high-fat salad dressings, or any added butter or margarine.  Do not be afraid to ask questions. Ask your server about the portion size, cooking methods, ingredients and if items can be substituted. Restaurants do not list all available items on the menu. You can ask for your main entree to be prepared using skim milk, oil instead of butter or margarine, and without gravy or sauces. Ask your waiter or waitress to serve salad dressings, gravy, sauces, margarine, and sour cream on the side. You can then add the amount your meal plan suggests.  Add more vegetables whenever possible.  Avoid items that are labeled "jumbo," "giant," "deluxe," or "supersized."  You may  want to split an entre with someone and order an extra side salad.  Watch for hidden calories in foods like croutons, bacon, or cheese.  Ask your server to take away the bread basket or chips from your table.  Order a dinner salad as an appetizer. You can eat most foods served in a restaurant. Some foods are better choices than others. Breads and Starches  Recommended: All kinds of bread (wheat, rye, white, oatmeal, [place], [place], raisin), hard or soft dinner rolls, frankfurter or hamburger buns, small bagels, small corn or whole-wheat flour tortillas.  Avoid: Frosted or glazed breads, butter rolls, egg or cheese breads, croissants, sweet rolls, pastries, coffee cake, glazed or frosted doughnuts, muffins. Crackers  Recommended: Animal crackers, graham, rye, saltine, oyster, and matzoth crackers. Bread sticks, melba toast, rusks, pretzels, popcorn (without fat), zwieback toast.  Avoid: High-fat snack crackers or chips. Buttered popcorn. Cereals  Recommended: Hot and cold cereals. Whole grains such as oatmeal or shredded wheat are good choices.  Avoid: Sugar-coated or granola type cereals. Potatoes/Pasta/Rice/Beans  Recommended: Order baked, boiled, or mashed potatoes, rice or noodles without added fat, whole beans. Order gravies, butter, margarine, or sauces on the side so you can control the amount you add.  Avoid: Hash browns or fried potatoes. Potatoes, pasta, or rice prepared with cream or cheese sauce. Potato or pasta salads prepared with large  amounts of dressing. Fried beans or fried rice. Vegetables  Recommended: Order steamed, baked, boiled, or stewed vegetables without sauces or extra fat. Ask that sauce be served on the side. If vegetables are not listed on the menu, ask what is available.  Avoid: Vegetables prepared with cream, butter, or cheese sauce. Fried vegetables. Salad Bars  Recommended: Many of the vegetables at a salad bar are considered "free." Use lemon  juice, vinegar, or low-calorie salad dressing (fewer than 20 calories per serving) as "free" dressings for your salad. Look for salad bar ingredients that have no added fat or sugar such as tomatoes, lettuce, cucumbers, broccoli, carrots, onions, and mushrooms.  Avoid: Prepared salads with large amounts of dressing, such as coleslaw, caesar salad, macaroni salad, bean salad, or carrot salad. Fruit  Recommended: Eat fresh fruit or fresh fruit salad without added dressing. A salad bar often offers fresh fruit choices, but canned fruit at a restaurant is usually packed in sugar or syrup.  Avoid: Sweetened canned or frozen fruits, plain or sweetened fruit juice. Fruit salads with dressing, sour cream, or sugar added to them. Meat and Meat Substitutes  Recommended: Order broiled, baked, roasted, or grilled meat, poultry, or fish. Trim off all visible fat. Do not eat the skin of poultry. The size stated on the menu is the raw weight. Meat shrinks by  in cooking (for example, 4 oz raw equals 3 oz cooked meat).  Avoid: Deep-fat fried meat, poultry, or fish. Breaded meats. Eggs  Recommended: Order soft, hard-cooked, poached, or scrambled eggs. Omelets may be okay, depending on what ingredients are added. Egg substitutes are also a good choice.  Avoid: Fried eggs, eggs prepared with cream or cheese sauce. Milk  Recommended: Order low-fat or fat-free milk according to your meal plan. Plain, nonfat yogurt or flavored yogurt with no sugar added may be used as a substitute for milk. Soy milk may also be used.  Avoid: Milk shakes or sweetened milk beverages. Soups and Combination Foods  Recommended: Clear broth or consomm are "free" foods and may be used as an appetizer. Broth-based soups with fat removed count as a starch serving and are preferred over cream soups. Soups made with beans or split peas may be eaten but count as a starch.  Avoid: Fatty soups, soup made with cream, cheese soup.  Combination foods prepared with excessive amounts of fat or with cream or cheese sauces. Desserts and Sweets  Recommended: Ask for fresh fruit. Sponge or angel food cake without icing, ice milk, no sugar added ice cream, sherbet, or frozen yogurt may fit into your meal plan occasionally.  Avoid: Pastries, puddings, pies, cakes with icing, custard, gelatin desserts. Fats and Oils  Recommended: Choose healthy fats such as olive oil, canola oil, or tub margarine, reduced fat or fat-free sour cream, cream cheese, avocado, or nuts.  Avoid: Any fats in excess of your allowed portion. Deep-fried foods or any food with a large amount of fat. Note: Ask for all fats to be served on the side, and limit your portion sizes according to your meal plan. Document Released: 10/11/2005 Document Revised: 01/03/2012 Document Reviewed: 05/01/2009 [facility] Patient Information 2014 [place], [place]. Diets for Diabetes, Food Labeling Look at food labels to help you decide how much of a product you can eat. You will want to check the amount of total carbohydrate in a serving to see how the food fits into your meal plan. In the list of ingredients, the ingredient present in the largest amount  by weight must be listed first, followed by the other ingredients in descending order. STANDARD OF IDENTITY Most products have a list of ingredients. However, foods that the Food and Drug Administration (FDA) has given a standard of identity do not need a list of ingredients. A standard of identity means that a food must contain certain ingredients if it is called a particular name. Examples are mayonnaise, peanut butter, ketchup, jelly, and cheese. LABELING TERMS There are many terms found on food labels. Some of these terms have specific definitions. Some terms are regulated by the FDA, and the FDA has clearly specified how they can be used. Others are not regulated or well-defined and can be misleading and confusing. SPECIFICALLY  DEFINED TERMS Nutritive Sweetener.  A sweetener that contains calories,such as table sugar or honey. Nonnutritive Sweetener.  A sweetener with few or no calories,such as saccharin, aspartame, sucralose, and cyclamate. LABELING TERMS REGULATED BY THE FDA Free.  The product contains only a tiny or small amount of fat, cholesterol, sodium, sugar, or calories. For example, a "fat-free" product will contain less than 0.5 g of fat per serving. Low.  A food described as "low" in fat, saturated fat, cholesterol, sodium, or calories could be eaten fairly often without exceeding dietary guidelines. For example, "low in fat" means no more than 3 g of fat per serving. Lean.  "Lean" and "extra lean" are U.S. Department of Agriculture Scientist, research (physical sciences)) terms for use on meat and poultry products. "Lean" means the product contains less than 10 g of fat, 4 g of saturated fat, and 95 mg of cholesterol per serving. "Lean" is not as low in fat as a product labeled "low." Extra Lean.  "Extra lean" means the product contains less than 5 g of fat, 2 g of saturated fat, and 95 mg of cholesterol per serving. While "extra lean" has less fat than "lean," it is still higher in fat than a product labeled "low." Reduced, Less, Fewer.  A diet product that contains 25% less of a nutrient or calories than the regular version. For example, hot dogs might be labeled "25% less fat than our regular hot dogs." Light/Lite.  A diet product that contains  fewer calories or  the fat of the original. For example, "light in sodium" means a product with  the usual sodium. More.  One serving contains at least 10% more of the daily value of a vitamin, mineral, or fiber than usual. Good Source Of.  One serving contains 10% to 19% of the daily value for a particular vitamin, mineral, or fiber. Excellent Source Of.  One serving contains 20% or more of the daily value for a particular nutrient. Other terms used might be "high in" or "rich  in." Enriched or Fortified.  The product contains added vitamins, minerals, or protein. Nutrition labeling must be used on enriched or fortified foods. Imitation.  The product has been altered so that it is lower in protein, vitamins, or minerals than the usual food,such as imitation peanut butter. Total Fat.  The number listed is the total of all fat found in a serving of the product. Under total fat, food labels must list saturated fat and trans fat, which are associated with raising bad cholesterol and an increased risk of heart blood vessel disease. Saturated Fat.  Mainly fats from animal-based sources. Some examples are red meat, cheese, cream, whole milk, and coconut oil. Trans Fat.  Found in some fried snack foods, packaged foods, and fried restaurant foods. It is  recommended you eat as close to 0 g of trans fat as possible, since it raises bad cholesterol and lowers good cholesterol. Polyunsaturated and Monounsaturated Fats.  More healthful fats. These fats are from plant sources. Total Carbohydrate.  The number of carbohydrate grams in a serving of the product. Under total carbohydrate are listed the other carbohydrate sources, such as dietary fiber and sugars. Dietary Fiber.  A carbohydrate from plant sources. Sugars.  Sugars listed on the label contain all naturally occurring sugars as well as added sugars. LABELING TERMS NOT REGULATED BY THE FDA Sugarless.  Table sugar (sucrose) has not been added. However, the manufacturer may use another form of sugar in place of sucrose to sweeten the product. For example, sugar alcohols are used to sweeten foods. Sugar alcohols are a form of sugar but are not table sugar. If a product contains sugar alcohols in place of sucrose, it can still be labeled "sugarless." Low Salt, Salt-Free, Unsalted, No Salt, No Salt Added, Without Added Salt.  Food that is usually processed with salt has been made without salt. However, the food may  contain sodium-containing additives, such as preservatives, leavening agents, or flavorings. Natural.  This term has no legal meaning. Organic.  Foods that are certified as organic have been inspected and approved by the USDA to ensure they are produced without pesticides, fertilizers containing synthetic ingredients, bioengineering, or ionizing radiation. Document Released: 10/14/2003 Document Revised: 01/03/2012 Document Reviewed: 05/01/2009 [facility] Patient Information 2014 [place], [place]. Monitoring for Diabetes There are two blood tests that help you monitor and manage your diabetes. These include:  An A1c (hemoglobin A1c) test.  This test is an average of your glucose (or blood sugar) control over the past 3 months.  This is recommended as a way for you and your caregiver to understand how well your glucose levels are controlled on the average.  Your A1c goal will be determined by your caregiver, but it is usually best if it is less than 6.5% to 7.0%.  Glucose (sugar) attaches itself to red blood cells. The amount of glucose then can then be measured. The amount of glucose on the cells depends on how high your blood glucose has been.  SMBG test (self-monitoring blood glucose).  Using a blood glucose monitor (meter) to do SMBG testing is an easy way to monitor the amount of glucose in your blood and can help you improve your control. The monitor will tell you what your blood glucose is at that very moment. Every person with diabetes should have a blood glucose monitor and know how to use it. The better you control your blood sugar on a daily basis, the better your A1c levels will be. HOW OFTEN SHOULD I HAVE AN A1C LEVEL?  Every 3 months if your diabetes is not well controlled or if therapy has changed.  Every 6 months if you are meeting your treatment goals. HOW OFTEN SHOULD I DO SMBG TESTING?  Your caregiver will recommend how often you should test. Testing times are based on  the kind of medicine you take, type of diabetes you have, and your blood glucose control. Testing times can include:  Type 1 diabetes: test 3 or 4 times a day or as directed.  Type 2 diabetes and if you are taking insulin and diabetes pills: test 3 or 4 times a day or as directed.  If you are taking diabetes pills only and not reaching your target A1c: test 2 to 4 times a day or as  directed.  If you are taking diabetes pills and are controlling your diabetes well with diet and exercise, your caregiver will help you decide what is appropriate. WHAT TIME OF DAY SHOULD I TEST?  The best time of day to test your blood glucose depends on medications, mealtimes, exercise, and blood glucose control. It is best to test at different times because this will help you know how you are doing throughout the day. Your caregiver will help you decide what is best. WHAT SHOULD MY BLOOD GLUCOSE BE? Blood glucose target goals may vary depending on each persons needs, whether they have type 1 or type 2 diabetes or what medications they are taking. However, as a general rule, blood glucose should be:  Before meals   70-130 mg/dl.  After meals    ..less than 180 mg/dl. CHECK YOUR BLOOD GLUCOSE IF:  You have symptoms of low blood sugar (hypoglycemia), which may include dizziness, shaking, sweating, chills and confusion.  You have symptoms of high blood sugar (hyperglycemia), which may include sleepiness, blurred vision, frequent urination and excessive thirst.  You are learning how meals, physical activity and medicine affect your blood glucose level. The more you learn about how various foods, your medications, and activities affect you, the better job you will do of taking care of yourself.  You have a job in which poor control could cause safety problems while driving or operating machinery. CHECK YOUR BLOOD SUGAR MORE FREQUENTLY:  If you have medication or dietary changes.  If you begin taking other kinds  of medicines.  If you become sick or your level of stress increases. With an illness, your blood sugar may even be high without eating.  Before and after exercise. Follow your caregiver's testing recommendations during this time.  TO DISPOSE OF SHARPS: Each city or state may have different regulations. Check with your public works or Theatre manager.  Sharps containers can be purchased from pharmacies.  Place all used sharps in a container. You do not need to replace any protective covers over the needle or break the needle.  Sharps should be contained in a ridge, leakproof, puncture-resistant container.  Plastic detergent bottle.  Bleach bottle.  When container is almost full, add a solution that is 1 part laundry bleach and 9 parts tap water (it is okay to use undiluted bleach if you wish). You may want to wear gloves since bleach can damage tissue. Let the solution sit for 30 minutes.  Carefully pour all the liquid into the sanitary sewer. Be sure to prevent the sharps from falling out.  Once liquid is drained, reseal the container with lid and tape it shut with duct tape. This will prevent the cap from coming off.  Dispose of the container with your regular household trash and waste. It is a good idea to let your trash hauler know that you will be disposing of sharps. Document Released: 10/14/2003 Document Revised: 07/05/2012 Document Reviewed: 04/14/2009 [facility] Patient Information 2014 [place], [place]. Type 2 Diabetes Mellitus, Adult Type 2 diabetes mellitus, often simply referred to as type 2 diabetes, is a long-lasting (chronic) disease. In type 2 diabetes, the pancreas does not make enough insulin (a hormone), the cells are less responsive to the insulin that is made (insulin resistance), or both. Normally, insulin moves sugars from food into the tissue cells. The tissue cells use the sugars for energy. The lack of insulin or the lack of normal response to insulin  causes excess sugars to build up in  the blood instead of going into the tissue cells. As a result, high blood sugar (hyperglycemia) develops. The effect of high sugar (glucose) levels can cause many complications. Type 2 diabetes was also previously called adult-onset diabetes but it can occur at any age.  RISK FACTORS  A person is predisposed to developing type 2 diabetes if someone in the family has the disease and also has one or more of the following primary risk factors:  Overweight.  An inactive lifestyle.  A history of consistently eating high-calorie foods. Maintaining a normal weight and regular physical activity can reduce the chance of developing type 2 diabetes. SYMPTOMS  A person with type 2 diabetes may not show symptoms initially. The symptoms of type 2 diabetes appear slowly. The symptoms include:  Increased thirst (polydipsia).  Increased urination (polyuria).  Increased urination during the night (nocturia).  Weight loss. This weight loss may be rapid.  Frequent, recurring infections.  Tiredness (fatigue).  Weakness.  Vision changes, such as blurred vision.  Fruity smell to your breath.  Abdominal pain.  Nausea or vomiting.  Cuts or bruises which are slow to heal.  Tingling or numbness in the hands or feet. DIAGNOSIS Type 2 diabetes is frequently not diagnosed until complications of diabetes are present. Type 2 diabetes is diagnosed when symptoms or complications are present and when blood glucose levels are increased. Your blood glucose level may be checked by one or more of the following blood tests:  A fasting blood glucose test. You will not be allowed to eat for at least 8 hours before a blood sample is taken.  A random blood glucose test. Your blood glucose is checked at any time of the day regardless of when you ate.  A hemoglobin A1c blood glucose test. A hemoglobin A1c test provides information about blood glucose control over the previous 3  months.  An oral glucose tolerance test (OGTT). Your blood glucose is measured after you have not eaten (fasted) for 2 hours and then after you drink a glucose-containing beverage. TREATMENT   You may need to take insulin or diabetes medicine daily to keep blood glucose levels in the desired range.  You will need to match insulin dosing with exercise and healthy food choices. The treatment goal is to maintain the before meal blood sugar (preprandial glucose) level at 70 130 mg/dL. HOME CARE INSTRUCTIONS   Have your hemoglobin A1c level checked twice a year.  Perform daily blood glucose monitoring as directed by your caregiver.  Monitor urine ketones when you are ill and as directed by your caregiver.  Take your diabetes medicine or insulin as directed by your caregiver to maintain your blood glucose levels in the desired range.  Never run out of diabetes medicine or insulin. It is needed every day.  Adjust insulin based on your intake of carbohydrates. Carbohydrates can raise blood glucose levels but need to be included in your diet. Carbohydrates provide vitamins, minerals, and fiber which are an essential part of a healthy diet. Carbohydrates are found in fruits, vegetables, whole grains, dairy products, legumes, and foods containing added sugars.    Eat healthy foods. Alternate 3 meals with 3 snacks.  Lose weight if overweight.  Carry a medical alert card or wear your medical alert jewelry.  Carry a 15 gram carbohydrate snack with you at all times to treat low blood glucose (hypoglycemia). Some examples of 15 gram carbohydrate snacks include:  Glucose tablets, 3 or 4   Glucose gel, 15 gram tube  Raisins, 2 tablespoons (24 grams)  Jelly beans, 6  Animal crackers, 8  Regular pop, 4 ounces (120 mL)  Gummy treats, 9  Recognize hypoglycemia. Hypoglycemia occurs with blood glucose levels of 70 mg/dL and below. The risk for hypoglycemia increases when fasting or skipping  meals, during or after intense exercise, and during sleep. Hypoglycemia symptoms can include:  Tremors or shakes.  Decreased ability to concentrate.  Sweating.  Increased heart rate.  Headache.  Dry mouth.  Hunger.  Irritability.  Anxiety.  Restless sleep.  Altered speech or coordination.  Confusion.  Treat hypoglycemia promptly. If you are alert and able to safely swallow, follow the 15:15 rule:  Take 15 20 grams of rapid-acting glucose or carbohydrate. Rapid-acting options include glucose gel, glucose tablets, or 4 ounces (120 mL) of fruit juice, regular soda, or low fat milk.  Check your blood glucose level 15 minutes after taking the glucose.  Take 15 20 grams more of glucose if the repeat blood glucose level is still 70 mg/dL or below.  Eat a meal or snack within 1 hour once blood glucose levels return to normal.    Be alert to polyuria and polydipsia which are early signs of hyperglycemia. An early awareness of hyperglycemia allows for prompt treatment. Treat hyperglycemia as directed by your caregiver.  Engage in at least 150 minutes of moderate-intensity physical activity a week, spread over at least 3 days of the week or as directed by your caregiver. In addition, you should engage in resistance exercise at least 2 times a week or as directed by your caregiver.  Adjust your medicine and food intake as needed if you start a new exercise or sport.  Follow your sick day plan at any time you are unable to eat or drink as usual.  Avoid tobacco use.  Limit alcohol intake to no more than 1 drink per day for nonpregnant women and 2 drinks per day for men. You should drink alcohol only when you are also eating food. Talk with your caregiver whether alcohol is safe for you. Tell your caregiver if you drink alcohol several times a week.  Follow up with your caregiver regularly.  Schedule an eye exam soon after the diagnosis of type 2 diabetes and then  annually.  Perform daily skin and foot care. Examine your skin and feet daily for cuts, bruises, redness, nail problems, bleeding, blisters, or sores. A foot exam by a caregiver should be done annually.  Brush your teeth and gums at least twice a day and floss at least once a day. Follow up with your dentist regularly.  Share your diabetes management plan with your workplace or school.  Stay up-to-date with immunizations.  Learn to manage stress.  Obtain ongoing diabetes education and support as needed.  Participate in, or seek rehabilitation as needed to maintain or improve independence and quality of life. Request a physical or occupational therapy referral if you are having foot or hand numbness or difficulties with grooming, dressing, eating, or physical activity. SEEK MEDICAL CARE IF:   You are unable to eat food or drink fluids for more than 6 hours.  You have nausea and vomiting for more than 6 hours.  Your blood glucose level is over 240 mg/dL.  There is a change in mental status.  You develop an additional serious illness.  You have diarrhea for more than 6 hours.  You have been sick or have had a fever for a couple of days and are not getting  better.  You have pain during any physical activity.  SEEK IMMEDIATE MEDICAL CARE IF:  You have difficulty breathing.  You have moderate to large ketone levels. MAKE SURE YOU:  Understand these instructions.  Will watch your condition.  Will get help right away if you are not doing well or get worse. Document Released: 10/11/2005 Document Revised: 07/05/2012 Document Reviewed: 05/09/2012 [facility] Patient Information 2014 [place].

## 2014-03-15 NOTE — Progress Notes (Signed)
Subjective:    Patient ID: Cynthia Herrera, female    DOB: Aug 19, 1946, 68 y.o.   MRN: [id number]  HPI  68 year old patient who is seen today preoperatively.  Due to hyperglycemia.  The patient has a history of impaired glucose tolerance, but blood sugars have been consistently more elevated for the past 5 or 6 months with occasional readings greater than 200. She is scheduled for decompressive laminectomy for severe spinal stenosis early next week.  She has required rare steroid therapy.  Due to her low back pain, but apparently none since January.  She has been followed closely by cardiology and has had a recent heart catheterization.  She has paroxysmal atrial fibrillation  Past Medical History  Diagnosis Date  . Personal history of colonic polyps     adenomas 03 and 08  . Hyperlipidemia     takes Lipitor daily  . Osteopenia   . Vitamin D deficiency     takes Vit D  . Allergy     seasonal  . Arthritis   . Breast cancer     left: surgery,chemo,radiation  . Atrial fibrillation     s/p TEE DCCV and repeat DCCV 11/14/2013  . DCM (dilated cardiomyopathy)     EF 15-20% ? tachycardia induced  . Hypertension     takes Losartan and Metoprolol.   Marland Kitchen Dysrhythmia     A fib-takes AMiodarone,Digoxin,and Xarelto daily  . Chronic systolic CHF (congestive heart failure)     takes Furosemide and Aldactone daily  . COPD (chronic obstructive pulmonary disease)     no inhalers   . History of bronchitis 15+yrs ago  . Joint pain   . Chronic back pain     lumbar stenosis  . Muscle spasm     takes Flexeril daily as needed   . GERD (gastroesophageal reflux disease)     once in a while;depends on what she eats  . Nocturia   . Cataracts, bilateral     immature    History   Social History  . Marital Status: Widowed    Spouse Name: N/A    Number of Children: N/A  . Years of Education: N/A   Occupational History  . Not on file.   Social History Main Topics  . Smoking status: Former  Smoker    Quit date: 08/24/2001  . Smokeless tobacco: Never Used  . Alcohol Use: No  . Drug Use: No  . Sexual Activity: Not on file   Other Topics Concern  . Not on file   Social History Narrative  . No narrative on file    Past Surgical History  Procedure Laterality Date  . Cholecystectomy  2004  . Colonoscopy  2003, September 2008, April 01, 2011    adenomas 03 and 08, polyp 12, diverticulosis  . Joint replacement      totally replacement surgery  . Tubal ligation    . Breast lumpectomy  2002  . Port-a-cath removal    . Total knee arthroplasty  2006  . Tee without cardioversion N/A 11/12/2013    Procedure: TRANSESOPHAGEAL ECHOCARDIOGRAM (TEE);  Surgeon: Thayer Headings, MD;  Location: [place];  Service: Cardiovascular;  Laterality: N/A;  pt b/p low, pt buccal membranes very dry, lips scapped, pt c/o thirst. NPO since MN and iv FLUIDS TOTAL INFUSING AT TOTAL 20ML HR....  Dr. Cathie Olden order allow NS to bolus during procedure....very dry,NS bolus 250 ml total..pt responding well to meds..  . Cardioversion N/A 11/12/2013  Procedure: CARDIOVERSION;  Surgeon: Thayer Headings, MD;  Location: [facility];  Service: Cardiovascular;  Laterality: N/A;  10:08  Dr. Marissa Nestle, anesthesia present, Lido   60mg ,  propofol 50mg , IV for elective cardioversion....Dr. Cathie Olden delievered synch 120 joules with successful cardioversion to NSR  . Cardioversion N/A 11/12/2013    Procedure: CARDIOVERSION;  Surgeon: Thayer Headings, MD;  Location: [place];  Service: Cardiovascular;  Laterality: N/A;  . Cardioversion N/A 11/14/2013    Procedure: CARDIOVERSION (BEDSIDE);  Surgeon: Larey Dresser, MD;  Location: [place];  Service: Cardiovascular;  Laterality: N/A;  Shanda Howells a cath placed    . Mastectomy  July 2002    left  . Cardiac catheterization  2015    Family History  Problem Relation Age of Onset  . Dementia Sister   . Diabetes Sister   . Diabetes Brother   . Heart disease Brother   . Liver  cancer Mother   . Diabetes Mother   . Cancer Mother     pancreatic  . Diabetes Father   . Heart disease Father     Allergies  Allergen Reactions  . Codeine Phosphate Other (See Comments)    Weird feeling "    Current Outpatient Prescriptions on File Prior to Visit  Medication Sig Dispense Refill  . amiodarone (PACERONE) 200 MG tablet Take 200 mg by mouth 2 (two) times daily.       Marland Kitchen atorvastatin (LIPITOR) 20 MG tablet Take 1 tablet (20 mg total) by mouth at bedtime.  30 tablet  5  . calcium carbonate (OS-CAL) 600 MG TABS Take 1,200 mg by mouth daily with breakfast.       . Cholecalciferol (VITAMIN D) 2000 UNITS tablet Take 2,000 Units by mouth daily.       . cyclobenzaprine (FLEXERIL) 10 MG tablet Take 1 tablet (10 mg total) by mouth 3 (three) times daily as needed for muscle spasms.  30 tablet  2  . digoxin (LANOXIN) 0.125 MG tablet Take 1 tablet (0.125 mg total) by mouth daily.  30 tablet  5  . fluticasone (FLONASE) 50 MCG/ACT nasal spray Place 1 spray into both nostrils daily as needed for allergies.      . furosemide (LASIX) 20 MG tablet Take 3 tablets (60 mg total) by mouth 2 (two) times daily.  100 tablet  0  . Hydrocodone-Acetaminophen 5-300 MG TABS Take 1 tablet by mouth daily as needed (pain).       Marland Kitchen losartan (COZAAR) 50 MG tablet Take 25 mg by mouth 2 (two) times daily.      . metoprolol succinate (TOPROL-XL) 25 MG 24 hr tablet Take 1 tablet (25 mg total) by mouth 2 (two) times daily.  60 tablet  5  . Multiple Vitamin (MULTIVITAMIN) tablet Take 1 tablet by mouth daily.       . potassium chloride SA (K-DUR,KLOR-CON) 20 MEQ tablet Take 1 tablet (20 mEq total) by mouth 2 (two) times daily.  60 tablet  11  . Rivaroxaban (XARELTO) 20 MG TABS tablet Take 1 tablet (20 mg total) by mouth daily with supper. Restart on Saturday am 4/11  30 tablet  5  . spironolactone (ALDACTONE) 25 MG tablet Take 1 tablet (25 mg total) by mouth daily.  30 tablet  5   No current facility-administered  medications on file prior to visit.    BP 114/70  Pulse 68  Temp(Src) 98 F (36.7 C) (Oral)  Wt 238 lb (107.956 kg)  SpO2 97%  Review of Systems  Constitutional: Negative.   HENT: Negative for congestion, dental problem, hearing loss, rhinorrhea, sinus pressure, sore throat and tinnitus.   Eyes: Positive for visual disturbance. Negative for pain and discharge.  Respiratory: Negative for cough and shortness of breath.   Cardiovascular: Negative for chest pain, palpitations and leg swelling.  Gastrointestinal: Negative for nausea, vomiting, abdominal pain, diarrhea, constipation, blood in stool and abdominal distention.  Genitourinary: Negative for dysuria, urgency, frequency, hematuria, flank pain, vaginal bleeding, vaginal discharge, difficulty urinating, vaginal pain and pelvic pain.  Musculoskeletal: Positive for back pain and gait problem. Negative for arthralgias and joint swelling.  Skin: Negative for rash.  Neurological: Negative for dizziness, syncope, speech difficulty, weakness, numbness and headaches.  Hematological: Negative for adenopathy.  Psychiatric/Behavioral: Negative for behavioral problems, dysphoric mood and agitation. The patient is not nervous/anxious.        Objective:   Physical Exam  Constitutional: She is oriented to person, place, and time. She appears well-developed and well-nourished.  Wt Readings from Last 3 Encounters: 03/15/14 : 238 lb (107.956 kg) 03/11/14 : 239 lb 10.2 oz (108.699 kg) 02/01/14 : 239 lb (108.41 kg)  HENT:  Head: Normocephalic.  Right Ear: External ear normal.  Left Ear: External ear normal.  Mouth/Throat: Oropharynx is clear and moist.  Eyes: Conjunctivae and EOM are normal. Pupils are equal, round, and reactive to light.  Neck: Normal range of motion. Neck supple. No thyromegaly present.  Cardiovascular: Normal rate, regular rhythm, normal heart sounds and intact distal pulses.   Pulmonary/Chest: Effort normal and  breath sounds normal.  Abdominal: Soft. Bowel sounds are normal. She exhibits no mass. There is no tenderness.  Musculoskeletal: Normal range of motion.  Lymphadenopathy:    She has no cervical adenopathy.  Neurological: She is alert and oriented to person, place, and time.  Skin: Skin is warm and dry. No rash noted.  Psychiatric: She has a normal mood and affect. Her behavior is normal.          Assessment & Plan:   Diabetes mellitus.  We'll check a hemoglobin A1c.  Due to impending surgery, we'll start low-dose basal insulin only at this time.  Postop, hopefully to control with metformin or possibly another medication like an SGLT2.  The patient was given instructions on insulin injection therapy, as well as a home glucometer and samples of Levemir.  She'll receive inpatient diabetic teaching and will see her postoperatively to reassess diabetic management Hypertension stable Chronic low back pain  No contraindications to anticipated surgery next week.  Suggest a medical consultation as an inpatient for diabetic management  Hold Xarelto  anticoagulation

## 2014-03-15 NOTE — Progress Notes (Signed)
Pre visit review using our clinic review tool, if applicable. No additional management support is needed unless otherwise documented below in the visit note. 

## 2014-03-16 ENCOUNTER — Other Ambulatory Visit: Payer: Self-pay | Admitting: Cardiology

## 2014-03-18 MED ORDER — CEFAZOLIN SODIUM-DEXTROSE 2-3 GM-% IV SOLR
2.0000 g | INTRAVENOUS | Status: AC
  Administered 2014-03-19: 2 g via INTRAVENOUS
  Filled 2014-03-18: qty 50

## 2014-03-19 ENCOUNTER — Encounter (HOSPITAL_COMMUNITY): Payer: Medicare Other | Admitting: Vascular Surgery

## 2014-03-19 ENCOUNTER — Encounter (HOSPITAL_COMMUNITY)
Admission: RE | Disposition: A | Discharge status: Home / Self Care | Payer: Self-pay | Source: Ambulatory Visit | Attending: Neurological Surgery

## 2014-03-19 ENCOUNTER — Inpatient Hospital Stay (HOSPITAL_COMMUNITY)
Admission: RE | Admit: 2014-03-19 | Discharge: 2014-03-22 | DRG: 519 | Disposition: A | Discharge status: Home / Self Care | Payer: Medicare Other | Source: Ambulatory Visit | Attending: Neurological Surgery | Admitting: Neurological Surgery

## 2014-03-19 ENCOUNTER — Telehealth: Payer: Self-pay | Admitting: Internal Medicine

## 2014-03-19 ENCOUNTER — Inpatient Hospital Stay (HOSPITAL_COMMUNITY): Payer: Medicare Other

## 2014-03-19 ENCOUNTER — Inpatient Hospital Stay (HOSPITAL_COMMUNITY): Payer: Medicare Other | Admitting: Anesthesiology

## 2014-03-19 ENCOUNTER — Encounter (HOSPITAL_COMMUNITY): Payer: Self-pay | Admitting: *Deleted

## 2014-03-19 DIAGNOSIS — G8929 Other chronic pain: Secondary | ICD-10-CM | POA: Diagnosis present

## 2014-03-19 DIAGNOSIS — Z8249 Family history of ischemic heart disease and other diseases of the circulatory system: Secondary | ICD-10-CM

## 2014-03-19 DIAGNOSIS — Z9851 Tubal ligation status: Secondary | ICD-10-CM

## 2014-03-19 DIAGNOSIS — H269 Unspecified cataract: Secondary | ICD-10-CM | POA: Diagnosis present

## 2014-03-19 DIAGNOSIS — I10 Essential (primary) hypertension: Secondary | ICD-10-CM | POA: Diagnosis present

## 2014-03-19 DIAGNOSIS — E559 Vitamin D deficiency, unspecified: Secondary | ICD-10-CM | POA: Diagnosis present

## 2014-03-19 DIAGNOSIS — Z860100 Personal history of colon polyps, unspecified: Secondary | ICD-10-CM

## 2014-03-19 DIAGNOSIS — I509 Heart failure, unspecified: Secondary | ICD-10-CM | POA: Diagnosis present

## 2014-03-19 DIAGNOSIS — J449 Chronic obstructive pulmonary disease, unspecified: Secondary | ICD-10-CM | POA: Diagnosis present

## 2014-03-19 DIAGNOSIS — I4891 Unspecified atrial fibrillation: Secondary | ICD-10-CM | POA: Diagnosis present

## 2014-03-19 DIAGNOSIS — Z87891 Personal history of nicotine dependence: Secondary | ICD-10-CM

## 2014-03-19 DIAGNOSIS — Z853 Personal history of malignant neoplasm of breast: Secondary | ICD-10-CM

## 2014-03-19 DIAGNOSIS — Z833 Family history of diabetes mellitus: Secondary | ICD-10-CM

## 2014-03-19 DIAGNOSIS — Z9089 Acquired absence of other organs: Secondary | ICD-10-CM

## 2014-03-19 DIAGNOSIS — Z9221 Personal history of antineoplastic chemotherapy: Secondary | ICD-10-CM

## 2014-03-19 DIAGNOSIS — E119 Type 2 diabetes mellitus without complications: Secondary | ICD-10-CM | POA: Diagnosis present

## 2014-03-19 DIAGNOSIS — Z9010 Acquired absence of unspecified breast and nipple: Secondary | ICD-10-CM

## 2014-03-19 DIAGNOSIS — J4489 Other specified chronic obstructive pulmonary disease: Secondary | ICD-10-CM | POA: Diagnosis present

## 2014-03-19 DIAGNOSIS — M48061 Spinal stenosis, lumbar region without neurogenic claudication: Secondary | ICD-10-CM

## 2014-03-19 DIAGNOSIS — Z794 Long term (current) use of insulin: Secondary | ICD-10-CM

## 2014-03-19 DIAGNOSIS — Z800 Family history of malignant neoplasm of digestive organs: Secondary | ICD-10-CM

## 2014-03-19 DIAGNOSIS — M47817 Spondylosis without myelopathy or radiculopathy, lumbosacral region: Principal | ICD-10-CM | POA: Diagnosis present

## 2014-03-19 DIAGNOSIS — I5022 Chronic systolic (congestive) heart failure: Secondary | ICD-10-CM | POA: Diagnosis present

## 2014-03-19 DIAGNOSIS — E785 Hyperlipidemia, unspecified: Secondary | ICD-10-CM | POA: Diagnosis present

## 2014-03-19 DIAGNOSIS — Z923 Personal history of irradiation: Secondary | ICD-10-CM

## 2014-03-19 DIAGNOSIS — Z96659 Presence of unspecified artificial knee joint: Secondary | ICD-10-CM

## 2014-03-19 DIAGNOSIS — Z79899 Other long term (current) drug therapy: Secondary | ICD-10-CM

## 2014-03-19 DIAGNOSIS — K219 Gastro-esophageal reflux disease without esophagitis: Secondary | ICD-10-CM | POA: Diagnosis present

## 2014-03-19 DIAGNOSIS — Z7901 Long term (current) use of anticoagulants: Secondary | ICD-10-CM

## 2014-03-19 HISTORY — PX: LUMBAR LAMINECTOMY/DECOMPRESSION MICRODISCECTOMY: SHX5026

## 2014-03-19 LAB — GLUCOSE, CAPILLARY
GLUCOSE-CAPILLARY: 191 mg/dL — AB (ref 70–99)
GLUCOSE-CAPILLARY: 132 mg/dL — AB (ref 70–99)
GLUCOSE-CAPILLARY: 117 mg/dL — AB (ref 70–99)
Glucose-Capillary: 174 mg/dL — ABNORMAL HIGH (ref 70–99)

## 2014-03-19 SURGERY — LUMBAR LAMINECTOMY/DECOMPRESSION MICRODISCECTOMY 4 LEVEL
Anesthesia: General | Site: Back

## 2014-03-19 MED ORDER — INSULIN DETEMIR 100 UNIT/ML ~~LOC~~ SOLN
10.0000 [IU] | Freq: Every day | SUBCUTANEOUS | Status: DC
  Administered 2014-03-20 – 2014-03-21 (×2): 10 [IU] via SUBCUTANEOUS
  Filled 2014-03-19 (×4): qty 0.1

## 2014-03-19 MED ORDER — SODIUM CHLORIDE 0.9 % IJ SOLN: 3.0000 mL | INTRAMUSCULAR | Status: DC | PRN

## 2014-03-19 MED ORDER — LOSARTAN POTASSIUM 25 MG PO TABS
25.0000 mg | ORAL_TABLET | Freq: Two times a day (BID) | ORAL | Status: DC
  Administered 2014-03-19 – 2014-03-20 (×2): 25 mg via ORAL
  Filled 2014-03-19 (×8): qty 1

## 2014-03-19 MED ORDER — ONDANSETRON HCL 4 MG/2ML IJ SOLN
4.0000 mg | INTRAMUSCULAR | Status: DC | PRN
Start: 2014-03-19 — End: 2014-03-22

## 2014-03-19 MED ORDER — DIGOXIN 125 MCG PO TABS
0.1250 mg | ORAL_TABLET | Freq: Every day | ORAL | Status: DC
  Administered 2014-03-19 – 2014-03-22 (×3): 0.125 mg via ORAL
  Filled 2014-03-19 (×4): qty 1

## 2014-03-19 MED ORDER — SPIRONOLACTONE 25 MG PO TABS
25.0000 mg | ORAL_TABLET | Freq: Every day | ORAL | Status: DC
  Administered 2014-03-22: 25 mg via ORAL
  Filled 2014-03-19 (×4): qty 1

## 2014-03-19 MED ORDER — ROCURONIUM BROMIDE 100 MG/10ML IV SOLN: INTRAVENOUS | Status: DC | PRN

## 2014-03-19 MED ORDER — MENTHOL 3 MG MT LOZG
1.0000 | LOZENGE | OROMUCOSAL | Status: DC | PRN
  Administered 2014-03-21: 3 mg via ORAL
  Filled 2014-03-19: qty 9

## 2014-03-19 MED ORDER — VECURONIUM BROMIDE 10 MG IV SOLR
INTRAVENOUS | Status: DC | PRN
  Administered 2014-03-19: 2 mg via INTRAVENOUS
  Administered 2014-03-19: 3 mg via INTRAVENOUS

## 2014-03-19 MED ORDER — RIVAROXABAN 20 MG PO TABS
20.0000 mg | ORAL_TABLET | Freq: Every day | ORAL | Status: DC
  Filled 2014-03-19: qty 1

## 2014-03-19 MED ORDER — HYDROCODONE-ACETAMINOPHEN 5-325 MG PO TABS
1.0000 | ORAL_TABLET | ORAL | Status: DC | PRN
  Administered 2014-03-21 (×4): 1 via ORAL
  Administered 2014-03-22 (×2): 2 via ORAL
  Filled 2014-03-19: qty 2
  Filled 2014-03-19 (×3): qty 1
  Filled 2014-03-19: qty 2
  Filled 2014-03-19: qty 1

## 2014-03-19 MED ORDER — SODIUM CHLORIDE 0.9 % IJ SOLN
3.0000 mL | Freq: Two times a day (BID) | INTRAMUSCULAR | Status: DC
  Administered 2014-03-20 – 2014-03-22 (×4): 3 mL via INTRAVENOUS

## 2014-03-19 MED ORDER — PHENYLEPHRINE 40 MCG/ML (10ML) SYRINGE FOR IV PUSH (FOR BLOOD PRESSURE SUPPORT)
PREFILLED_SYRINGE | INTRAVENOUS | Status: AC
  Filled 2014-03-19: qty 10

## 2014-03-19 MED ORDER — PHENYLEPHRINE HCL 10 MG/ML IJ SOLN
10.0000 mg | INTRAVENOUS | Status: DC | PRN
  Administered 2014-03-19: 10 ug/min via INTRAVENOUS

## 2014-03-19 MED ORDER — FLEET ENEMA 7-19 GM/118ML RE ENEM: 1.0000 | ENEMA | Freq: Once | RECTAL | Status: AC | PRN

## 2014-03-19 MED ORDER — PROPOFOL 10 MG/ML IV BOLUS
INTRAVENOUS | Status: DC | PRN
  Administered 2014-03-19: 150 mg via INTRAVENOUS

## 2014-03-19 MED ORDER — MORPHINE SULFATE 2 MG/ML IJ SOLN: 1.0000 mg | INTRAMUSCULAR | Status: DC | PRN

## 2014-03-19 MED ORDER — VECURONIUM BROMIDE 10 MG IV SOLR
INTRAVENOUS | Status: AC
  Filled 2014-03-19: qty 10

## 2014-03-19 MED ORDER — PROPOFOL 10 MG/ML IV BOLUS
INTRAVENOUS | Status: AC
  Filled 2014-03-19: qty 20

## 2014-03-19 MED ORDER — LIDOCAINE-EPINEPHRINE 1 %-1:100000 IJ SOLN
INTRAMUSCULAR | Status: DC | PRN
  Administered 2014-03-19: 5 mL

## 2014-03-19 MED ORDER — SUCCINYLCHOLINE CHLORIDE 20 MG/ML IJ SOLN
INTRAMUSCULAR | Status: AC
  Filled 2014-03-19: qty 1

## 2014-03-19 MED ORDER — METHOCARBAMOL 1000 MG/10ML IJ SOLN
500.0000 mg | Freq: Four times a day (QID) | INTRAMUSCULAR | Status: DC | PRN
  Filled 2014-03-19: qty 5

## 2014-03-19 MED ORDER — THROMBIN 5000 UNITS EX SOLR
CUTANEOUS | Status: DC | PRN
  Administered 2014-03-19 (×2): 5000 [IU] via TOPICAL

## 2014-03-19 MED ORDER — ONDANSETRON HCL 4 MG/2ML IJ SOLN
INTRAMUSCULAR | Status: DC | PRN
  Administered 2014-03-19: 4 mg via INTRAVENOUS

## 2014-03-19 MED ORDER — SODIUM CHLORIDE 0.9 % IR SOLN
Status: DC | PRN
  Administered 2014-03-19: 09:00:00

## 2014-03-19 MED ORDER — DOCUSATE SODIUM 100 MG PO CAPS
100.0000 mg | ORAL_CAPSULE | Freq: Two times a day (BID) | ORAL | Status: DC
  Administered 2014-03-19 – 2014-03-22 (×6): 100 mg via ORAL
  Filled 2014-03-19 (×6): qty 1

## 2014-03-19 MED ORDER — ONDANSETRON HCL 4 MG/2ML IJ SOLN
INTRAMUSCULAR | Status: AC
  Filled 2014-03-19: qty 2

## 2014-03-19 MED ORDER — KETOROLAC TROMETHAMINE 15 MG/ML IJ SOLN
15.0000 mg | Freq: Four times a day (QID) | INTRAMUSCULAR | Status: AC
  Administered 2014-03-19 – 2014-03-20 (×5): 15 mg via INTRAVENOUS
  Filled 2014-03-19 (×4): qty 1

## 2014-03-19 MED ORDER — STERILE WATER FOR INJECTION IJ SOLN
INTRAMUSCULAR | Status: AC
  Filled 2014-03-19: qty 10

## 2014-03-19 MED ORDER — KETOROLAC TROMETHAMINE 30 MG/ML IJ SOLN
INTRAMUSCULAR | Status: AC
  Filled 2014-03-19: qty 1

## 2014-03-19 MED ORDER — LIDOCAINE HCL (CARDIAC) 20 MG/ML IV SOLN
INTRAVENOUS | Status: DC | PRN
  Administered 2014-03-19: 60 mg via INTRAVENOUS

## 2014-03-19 MED ORDER — EPHEDRINE SULFATE 50 MG/ML IJ SOLN
INTRAMUSCULAR | Status: AC
  Filled 2014-03-19: qty 1

## 2014-03-19 MED ORDER — ACETAMINOPHEN 325 MG PO TABS
650.0000 mg | ORAL_TABLET | ORAL | Status: DC | PRN
  Administered 2014-03-19: 650 mg via ORAL
  Filled 2014-03-19: qty 2

## 2014-03-19 MED ORDER — ROCURONIUM BROMIDE 100 MG/10ML IV SOLN
INTRAVENOUS | Status: DC | PRN
  Administered 2014-03-19: 50 mg via INTRAVENOUS

## 2014-03-19 MED ORDER — FLUTICASONE PROPIONATE 50 MCG/ACT NA SUSP
1.0000 | Freq: Every day | NASAL | Status: DC | PRN
  Administered 2014-03-21: 1 via NASAL
  Filled 2014-03-19: qty 16

## 2014-03-19 MED ORDER — FENTANYL CITRATE 0.05 MG/ML IJ SOLN
INTRAMUSCULAR | Status: AC
  Filled 2014-03-19: qty 5

## 2014-03-19 MED ORDER — PHENYLEPHRINE HCL 10 MG/ML IJ SOLN
INTRAMUSCULAR | Status: DC | PRN
  Administered 2014-03-19: 120 ug via INTRAVENOUS
  Administered 2014-03-19: 160 ug via INTRAVENOUS

## 2014-03-19 MED ORDER — ACETAMINOPHEN 650 MG RE SUPP: 650.0000 mg | RECTAL | Status: DC | PRN

## 2014-03-19 MED ORDER — ALUM & MAG HYDROXIDE-SIMETH 200-200-20 MG/5ML PO SUSP: 30.0000 mL | Freq: Four times a day (QID) | ORAL | Status: DC | PRN

## 2014-03-19 MED ORDER — GLYCOPYRROLATE 0.2 MG/ML IJ SOLN
INTRAMUSCULAR | Status: DC | PRN
  Administered 2014-03-19: 0.6 mg via INTRAVENOUS

## 2014-03-19 MED ORDER — GELATIN ABSORBABLE MT POWD
OROMUCOSAL | Status: DC | PRN
  Administered 2014-03-19: 09:00:00 via TOPICAL

## 2014-03-19 MED ORDER — PHENOL 1.4 % MT LIQD: 1.0000 | OROMUCOSAL | Status: DC | PRN

## 2014-03-19 MED ORDER — METOPROLOL SUCCINATE ER 25 MG PO TB24
25.0000 mg | ORAL_TABLET | Freq: Two times a day (BID) | ORAL | Status: DC
  Administered 2014-03-19 – 2014-03-22 (×2): 25 mg via ORAL
  Filled 2014-03-19 (×8): qty 1

## 2014-03-19 MED ORDER — SODIUM CHLORIDE 0.9 % IV SOLN: 250.0000 mL | INTRAVENOUS | Status: DC

## 2014-03-19 MED ORDER — MIDAZOLAM HCL 5 MG/5ML IJ SOLN
INTRAMUSCULAR | Status: DC | PRN
  Administered 2014-03-19: 1 mg via INTRAVENOUS

## 2014-03-19 MED ORDER — ROCURONIUM BROMIDE 50 MG/5ML IV SOLN
INTRAVENOUS | Status: AC
  Filled 2014-03-19: qty 1

## 2014-03-19 MED ORDER — INSULIN ASPART 100 UNIT/ML ~~LOC~~ SOLN
0.0000 [IU] | Freq: Three times a day (TID) | SUBCUTANEOUS | Status: DC
  Administered 2014-03-19 – 2014-03-20 (×2): 3 [IU] via SUBCUTANEOUS
  Administered 2014-03-20 – 2014-03-21 (×3): 4 [IU] via SUBCUTANEOUS
  Administered 2014-03-21: 3 [IU] via SUBCUTANEOUS
  Administered 2014-03-21: 7 [IU] via SUBCUTANEOUS
  Administered 2014-03-22: 3 [IU] via SUBCUTANEOUS
  Administered 2014-03-22: 4 [IU] via SUBCUTANEOUS

## 2014-03-19 MED ORDER — ATORVASTATIN CALCIUM 20 MG PO TABS
20.0000 mg | ORAL_TABLET | Freq: Every day | ORAL | Status: DC
  Administered 2014-03-21: 20 mg via ORAL
  Filled 2014-03-19 (×4): qty 1

## 2014-03-19 MED ORDER — MIDAZOLAM HCL 2 MG/2ML IJ SOLN
INTRAMUSCULAR | Status: AC
  Filled 2014-03-19: qty 2

## 2014-03-19 MED ORDER — OXYCODONE-ACETAMINOPHEN 5-325 MG PO TABS
1.0000 | ORAL_TABLET | ORAL | Status: DC | PRN
  Administered 2014-03-22 (×2): 2 via ORAL
  Filled 2014-03-19 (×2): qty 2

## 2014-03-19 MED ORDER — BUPIVACAINE HCL (PF) 0.5 % IJ SOLN
INTRAMUSCULAR | Status: DC | PRN
  Administered 2014-03-19: 30 mL
  Administered 2014-03-19: 5 mL

## 2014-03-19 MED ORDER — POTASSIUM CHLORIDE CRYS ER 20 MEQ PO TBCR
20.0000 meq | EXTENDED_RELEASE_TABLET | Freq: Two times a day (BID) | ORAL | Status: DC
  Administered 2014-03-19 – 2014-03-22 (×6): 20 meq via ORAL
  Filled 2014-03-19 (×8): qty 1

## 2014-03-19 MED ORDER — SUCCINYLCHOLINE CHLORIDE 20 MG/ML IJ SOLN
INTRAMUSCULAR | Status: DC | PRN
  Administered 2014-03-19: 100 mg via INTRAVENOUS

## 2014-03-19 MED ORDER — CEFAZOLIN SODIUM 1-5 GM-% IV SOLN
1.0000 g | Freq: Three times a day (TID) | INTRAVENOUS | Status: AC
  Administered 2014-03-19 – 2014-03-20 (×2): 1 g via INTRAVENOUS
  Filled 2014-03-19 (×2): qty 50

## 2014-03-19 MED ORDER — HEMOSTATIC AGENTS (NO CHARGE) OPTIME
TOPICAL | Status: DC | PRN
  Administered 2014-03-19: 1 via TOPICAL

## 2014-03-19 MED ORDER — SODIUM CHLORIDE 0.9 % IV SOLN
INTRAVENOUS | Status: DC
  Administered 2014-03-19: 15:00:00 via INTRAVENOUS

## 2014-03-19 MED ORDER — LIDOCAINE HCL (CARDIAC) 20 MG/ML IV SOLN
INTRAVENOUS | Status: AC
  Filled 2014-03-19: qty 5

## 2014-03-19 MED ORDER — LACTATED RINGERS IV SOLN
INTRAVENOUS | Status: DC | PRN
  Administered 2014-03-19 (×2): via INTRAVENOUS

## 2014-03-19 MED ORDER — BISACODYL 10 MG RE SUPP: 10.0000 mg | Freq: Every day | RECTAL | Status: DC | PRN

## 2014-03-19 MED ORDER — FENTANYL CITRATE 0.05 MG/ML IJ SOLN: 25.0000 ug | INTRAMUSCULAR | Status: DC | PRN

## 2014-03-19 MED ORDER — POLYETHYLENE GLYCOL 3350 17 G PO PACK
17.0000 g | PACK | Freq: Every day | ORAL | Status: DC | PRN
  Filled 2014-03-19: qty 1

## 2014-03-19 MED ORDER — SODIUM CHLORIDE 0.9 % IJ SOLN
INTRAMUSCULAR | Status: AC
  Filled 2014-03-19: qty 10

## 2014-03-19 MED ORDER — FENTANYL CITRATE 0.05 MG/ML IJ SOLN
INTRAMUSCULAR | Status: DC | PRN
  Administered 2014-03-19 (×3): 50 ug via INTRAVENOUS

## 2014-03-19 MED ORDER — AMIODARONE HCL 200 MG PO TABS
200.0000 mg | ORAL_TABLET | Freq: Two times a day (BID) | ORAL | Status: DC
Start: 2014-03-19 — End: 2014-03-22
  Administered 2014-03-19 – 2014-03-22 (×7): 200 mg via ORAL
  Filled 2014-03-19 (×8): qty 1

## 2014-03-19 MED ORDER — FUROSEMIDE 20 MG PO TABS
20.0000 mg | ORAL_TABLET | Freq: Every day | ORAL | Status: DC
  Administered 2014-03-22: 20 mg via ORAL
  Filled 2014-03-19 (×4): qty 1

## 2014-03-19 MED ORDER — EPHEDRINE SULFATE 50 MG/ML IJ SOLN
INTRAMUSCULAR | Status: DC | PRN
  Administered 2014-03-19: 5 mg via INTRAVENOUS
  Administered 2014-03-19: 7.5 mg via INTRAVENOUS
  Administered 2014-03-19: 10 mg via INTRAVENOUS
  Administered 2014-03-19: 5 mg via INTRAVENOUS
  Administered 2014-03-19: 10 mg via INTRAVENOUS
  Administered 2014-03-19: 12.5 mg via INTRAVENOUS

## 2014-03-19 MED ORDER — SENNA 8.6 MG PO TABS
1.0000 | ORAL_TABLET | Freq: Two times a day (BID) | ORAL | Status: DC
  Administered 2014-03-19 – 2014-03-22 (×6): 8.6 mg via ORAL
  Filled 2014-03-19 (×7): qty 1

## 2014-03-19 MED ORDER — METHOCARBAMOL 500 MG PO TABS
500.0000 mg | ORAL_TABLET | Freq: Four times a day (QID) | ORAL | Status: DC | PRN
  Administered 2014-03-21 – 2014-03-22 (×3): 500 mg via ORAL
  Filled 2014-03-19 (×4): qty 1

## 2014-03-19 MED ORDER — INSULIN PEN NEEDLE 31G X 5 MM MISC: 1.0000 | Freq: Every day | Status: DC

## 2014-03-19 MED ORDER — ALBUMIN HUMAN 5 % IV SOLN
INTRAVENOUS | Status: DC | PRN
  Administered 2014-03-19 (×4): via INTRAVENOUS

## 2014-03-19 MED ORDER — 0.9 % SODIUM CHLORIDE (POUR BTL) OPTIME
TOPICAL | Status: DC | PRN
  Administered 2014-03-19: 1000 mL

## 2014-03-19 MED ORDER — NEOSTIGMINE METHYLSULFATE 10 MG/10ML IV SOLN
INTRAVENOUS | Status: DC | PRN
  Administered 2014-03-19: 4 mg via INTRAVENOUS

## 2014-03-19 SURGICAL SUPPLY — 61 items
ADH SKN CLS APL DERMABOND .7 (GAUZE/BANDAGES/DRESSINGS) ×1
ADH SKN CLS LQ APL DERMABOND (GAUZE/BANDAGES/DRESSINGS) ×1
BAG DECANTER FOR FLEXI CONT (MISCELLANEOUS) ×3 IMPLANT
BLADE 10 SAFETY STRL DISP (BLADE) ×1 IMPLANT
BLADE SURG ROTATE 9660 (MISCELLANEOUS) IMPLANT
BUR ACORN 6.0 (BURR) IMPLANT
BUR ACORN 6.0MM (BURR)
BUR MATCHSTICK NEURO 3.0 LAGG (BURR) ×3 IMPLANT
CANISTER SUCT 3000ML (MISCELLANEOUS) ×3 IMPLANT
CONT SPEC 4OZ CLIKSEAL STRL BL (MISCELLANEOUS) ×3 IMPLANT
DECANTER SPIKE VIAL GLASS SM (MISCELLANEOUS) ×3 IMPLANT
DERMABOND ADHESIVE PROPEN (GAUZE/BANDAGES/DRESSINGS) ×2
DERMABOND ADVANCED (GAUZE/BANDAGES/DRESSINGS) ×2
DERMABOND ADVANCED .7 DNX12 (GAUZE/BANDAGES/DRESSINGS) ×1 IMPLANT
DERMABOND ADVANCED .7 DNX6 (GAUZE/BANDAGES/DRESSINGS) IMPLANT
DRAPE LAPAROTOMY 100X72X124 (DRAPES) ×3 IMPLANT
DRAPE MICROSCOPE LEICA (MISCELLANEOUS) IMPLANT
DRAPE POUCH INSTRU U-SHP 10X18 (DRAPES) ×3 IMPLANT
DRAPE PROXIMA HALF (DRAPES) IMPLANT
DRSG OPSITE POSTOP 4X8 (GAUZE/BANDAGES/DRESSINGS) ×2 IMPLANT
DURAPREP 26ML APPLICATOR (WOUND CARE) ×3 IMPLANT
ELECT REM PT RETURN 9FT ADLT (ELECTROSURGICAL) ×3
ELECTRODE REM PT RTRN 9FT ADLT (ELECTROSURGICAL) ×1 IMPLANT
GAUZE SPONGE 4X4 16PLY XRAY LF (GAUZE/BANDAGES/DRESSINGS) IMPLANT
GLOVE BIOGEL PI IND STRL 7.5 (GLOVE) IMPLANT
GLOVE BIOGEL PI IND STRL 8.5 (GLOVE) ×1 IMPLANT
GLOVE BIOGEL PI INDICATOR 7.5 (GLOVE) ×6
GLOVE BIOGEL PI INDICATOR 8.5 (GLOVE) ×2
GLOVE ECLIPSE 7.5 STRL STRAW (GLOVE) ×2 IMPLANT
GLOVE ECLIPSE 8.5 STRL (GLOVE) ×5 IMPLANT
GLOVE EXAM NITRILE LRG STRL (GLOVE) IMPLANT
GLOVE EXAM NITRILE MD LF STRL (GLOVE) IMPLANT
GLOVE EXAM NITRILE XL STR (GLOVE) IMPLANT
GLOVE EXAM NITRILE XS STR PU (GLOVE) IMPLANT
GLOVE SS N UNI LF 7.0 STRL (GLOVE) ×8 IMPLANT
GOWN STRL REUS W/ TWL LRG LVL3 (GOWN DISPOSABLE) IMPLANT
GOWN STRL REUS W/ TWL XL LVL3 (GOWN DISPOSABLE) IMPLANT
GOWN STRL REUS W/TWL 2XL LVL3 (GOWN DISPOSABLE) ×3 IMPLANT
GOWN STRL REUS W/TWL LRG LVL3 (GOWN DISPOSABLE) ×9
GOWN STRL REUS W/TWL XL LVL3 (GOWN DISPOSABLE) ×3
KIT BASIN OR (CUSTOM PROCEDURE TRAY) ×3 IMPLANT
KIT ROOM TURNOVER OR (KITS) ×3 IMPLANT
NDL SPNL 20GX3.5 QUINCKE YW (NEEDLE) IMPLANT
NEEDLE HYPO 22GX1.5 SAFETY (NEEDLE) ×3 IMPLANT
NEEDLE SPNL 20GX3.5 QUINCKE YW (NEEDLE) IMPLANT
NS IRRIG 1000ML POUR BTL (IV SOLUTION) ×3 IMPLANT
PACK LAMINECTOMY NEURO (CUSTOM PROCEDURE TRAY) ×3 IMPLANT
PAD ARMBOARD 7.5X6 YLW CONV (MISCELLANEOUS) ×9 IMPLANT
PATTIES SURGICAL .5 X1 (DISPOSABLE) ×3 IMPLANT
RUBBERBAND STERILE (MISCELLANEOUS) IMPLANT
SPONGE GAUZE 4X4 12PLY (GAUZE/BANDAGES/DRESSINGS) ×3 IMPLANT
SPONGE SURGIFOAM ABS GEL SZ50 (HEMOSTASIS) ×3 IMPLANT
SUT PROLENE 6 0 BV (SUTURE) ×2 IMPLANT
SUT VIC AB 1 CT1 18XBRD ANBCTR (SUTURE) ×2 IMPLANT
SUT VIC AB 1 CT1 8-18 (SUTURE) ×6
SUT VIC AB 2-0 CP2 18 (SUTURE) ×6 IMPLANT
SUT VIC AB 3-0 SH 8-18 (SUTURE) ×6 IMPLANT
SYR 20ML ECCENTRIC (SYRINGE) ×3 IMPLANT
TOWEL OR 17X24 6PK STRL BLUE (TOWEL DISPOSABLE) ×3 IMPLANT
TOWEL OR 17X26 10 PK STRL BLUE (TOWEL DISPOSABLE) ×3 IMPLANT
WATER STERILE IRR 1000ML POUR (IV SOLUTION) ×3 IMPLANT

## 2014-03-19 NOTE — Op Note (Signed)
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 Operating microscope micro-dissection technique Surgeon: Kristeen Miss Assistant: Albertina Parr Anesthesia: Gen. endotracheal Indications: The patient is a 68 year old individual has significant heart disease she has degenerative spondylosis and stenosis at multiple levels worsening L2-L3 but then in decreasing orders of severity L3-4 L1-2 and L4-5 at L4-5 she essentially has bilateral lateral recess stenosis. She's been advised regarding the need for surgical decompression without fusion.  Procedure: Patient was brought to the operating room supine on a stretcher. After the smooth induction of general endotracheal anesthesia, she was turned prone. The back was prepped with alcohol and DuraPrep and draped in a sterile fashion. A midline incision was made in the lower lumbar spine and carried down to the lumbar dorsal fascia. A laminotomy was created in this region and the first laminar arch was identified plane to the interspace at the L1-L2 level. Using this marker then performed laminotomies at L2-3 bilaterally removing substantial overgrown bone and thickened and redundant yellow ligament. The dissection was done slowly to identify and preserve the integrity of the dura. Once the common dural tube was identified and then superiorly the take off of the L2 nerve root in its foramen was decompressed from significant overgrown bone and yellow ligament and the common dural tube and the path laterally to the L3 nerve root was also decompressed. Centrally redundant yellow ligament was removed to decompress the central canal. This was first done on the right side and then on  the left side were both laminotomies and both nerve roots were noted to be quite severely stenotic. Attention was then turned to L1-L2 where a similar procedure was carried out here there was more lateral recess stenosis particularly for the L2 nerve root that is the descending nerve root the foraminotomies were created over the L1 nerve root using the operating microscope and microdissection technique. L3-4 features significant central and left-sided lateral recess stenosis in this area was decompressed particularly for the left-sided nerve roots. The right side was also decompressed as there was a moderate degree of overgrown ligamentum flavum and lateral recess stenosis for the L3 nerve root superiorly and the L4 nerve root inferiorly once this was accomplished L4-5 underwent bilateral laminotomies and foraminotomies. Here there was noted to be significant redundant yellow ligament and lateral recesses and bony overgrowth particularly from the inferior portion of the laminar arch of L4. With this being resected the L4 nerve root superiorly could easily be sounded out the foramen the L5 nerve roots inferiorly were decompressed area there was noted to be some spinal fluid coming from the dorsal region and further exploration yielded a small pinhole dorsally this was oversewn with a singular she 6-0 Prolene suture it is felt that this may represent an previous problems from either myelography or perhaps epidural steroid injections does not appear to be consistent with a laceration of the dura or in surgery. Once this was closed it remains healed to a Valsalva of 40 mm 40 cm of water.  With all the nerve roots being sounded and decompressed of the wound was irrigated copiously with antibiotic irrigating solution. Hemostasis was achieved. 25 cc of half percent Marcaine was injected into the paraspinous musculature on both sides. Lumbar dorsal fascia was closed with #1 Vicryl. 2-0  Vicryl was used in the subcutaneous  tissues, 3-0 Vicryl to close subcuticular skin. Blood loss for the procedure was estimated at 200 cc. She tolerated procedure well returned to recovery room in stable condition.

## 2014-03-19 NOTE — Transfer of Care (Signed)
Immediate Anesthesia Transfer of Care Note  Patient: Cynthia Herrera  Procedure(s) Performed: Procedure(s) with comments: LUMBAR LAMINECTOMY/DECOMPRESSION MICRODISCECTOMY 4 LEVEL (N/A) - L1-2 L2-3 L3-4 L4-5 Laminectomy/Foraminotomy  Patient Location: PACU  Anesthesia Type:General  Level of Consciousness: sedated  Airway & Oxygen Therapy: Patient Spontanous Breathing and Patient connected to nasal cannula oxygen  Post-op Assessment: Report given to PACU RN and Post -op Vital signs reviewed and stable  Post vital signs: Reviewed and stable  Complications: No apparent anesthesia complications

## 2014-03-19 NOTE — Progress Notes (Signed)
Patient ID: Cynthia Herrera, female   DOB: [birth year], 68 y.o.   MRN: [id number] Alert and oriented. Incision is clean and dry Motor function is good in lower extremities Comfortable postop.

## 2014-03-19 NOTE — Anesthesia Procedure Notes (Signed)
Procedure Name: Intubation Date/Time: 03/19/2014 8:10 AM Performed by: Kyung Rudd Pre-anesthesia Checklist: Patient identified, Emergency Drugs available, Suction available, Patient being monitored and Timeout performed Patient Re-evaluated:Patient Re-evaluated prior to inductionOxygen Delivery Method: Circle system utilized Preoxygenation: Pre-oxygenation with 100% oxygen Intubation Type: IV induction Ventilation: Mask ventilation without difficulty Laryngoscope Size: Mac and 3 Grade View: Grade III Tube type: Oral Tube size: 7.0 mm Number of attempts: 2 Airway Equipment and Method: Stylet and Video-laryngoscopy Placement Confirmation: ETT inserted through vocal cords under direct vision,  positive ETCO2 and breath sounds checked- equal and bilateral Secured at: 23 cm Tube secured with: Tape Dental Injury: Teeth and Oropharynx as per pre-operative assessment  Comments: DL x1 with MAC 3, unable to visualize vocal cords. DL with glidescope, Grade I view. AOI with glidescope. +ETCO2 & BBS =.

## 2014-03-19 NOTE — Anesthesia Postprocedure Evaluation (Signed)
Anesthesia Post Note  Patient: Cynthia Herrera  Procedure(s) Performed: Procedure(s) (LRB): LUMBAR LAMINECTOMY/DECOMPRESSION MICRODISCECTOMY 4 LEVEL (N/A)  Anesthesia type: general  Patient location: PACU  Post pain: Pain level controlled  Post assessment: Patient's Cardiovascular Status Stable  Last Vitals:  Filed Vitals:   03/19/14 1400  BP: 93/35  Pulse: 68  Temp:   Resp: 18    Post vital signs: Reviewed and stable  Level of consciousness: sedated  Complications: No apparent anesthesia complications

## 2014-03-19 NOTE — H&P (Signed)
Cynthia Herrera is an 68 y.o. female.   Chief Complaint: Back pain and leg discomfort inability to walk distances HPI: Patient is a 68 year old individual who's had significant difficulties with her back for the past couple of years she was seen previously by Dr. Hazle Coca treated her conservatively for degenerative spondylosis and stenosis of the lumbar spine. I had seen her in followup and noted that she was having progressively increasing difficulties with walking any distance and increasing pain and discomfort both in her back into her lower extremities primarily MRI scan performed about a year ago demonstrates that she has severe spondylitic stenosis at L1-2 L2-3 L3-4 and L4-5 after careful consideration of her options I advised regarding surgical laminotomies and foraminotomies at each of these levels. Patient has an underlying history of some heart disease with atrial fibrillation she has been on anticoagulation recently the decompression and hope to alleviate the worst of stenosis so the patient may be able to get around a bit better.  Past Medical History  Diagnosis Date  . Personal history of colonic polyps     adenomas 03 and 08  . Hyperlipidemia     takes Lipitor daily  . Osteopenia   . Vitamin D deficiency     takes Vit D  . Allergy     seasonal  . Arthritis   . Breast cancer     left: surgery,chemo,radiation  . Atrial fibrillation     s/p TEE DCCV and repeat DCCV 11/14/2013  . DCM (dilated cardiomyopathy)     EF 15-20% ? tachycardia induced  . Hypertension     takes Losartan and Metoprolol.   Marland Kitchen Dysrhythmia     A fib-takes AMiodarone,Digoxin,and Xarelto daily  . Chronic systolic CHF (congestive heart failure)     takes Furosemide and Aldactone daily  . COPD (chronic obstructive pulmonary disease)     no inhalers   . History of bronchitis 15+yrs ago  . Joint pain   . Chronic back pain     lumbar stenosis  . Muscle spasm     takes Flexeril daily as needed   . GERD  (gastroesophageal reflux disease)     once in a while;depends on what she eats  . Nocturia   . Cataracts, bilateral     immature  . Diabetes mellitus without complication     recent dx    Past Surgical History  Procedure Laterality Date  . Cholecystectomy  2004  . Colonoscopy  2003, September 2008, April 01, 2011    adenomas 03 and 08, polyp 12, diverticulosis  . Joint replacement      totally replacement surgery  . Tubal ligation    . Breast lumpectomy  2002  . Port-a-cath removal    . Total knee arthroplasty  2006  . Tee without cardioversion N/A 11/12/2013    Procedure: TRANSESOPHAGEAL ECHOCARDIOGRAM (TEE);  Surgeon: Thayer Headings, MD;  Location: [facility];  Service: Cardiovascular;  Laterality: N/A;  pt b/p low, pt buccal membranes very dry, lips scapped, pt c/o thirst. NPO since MN and iv FLUIDS TOTAL INFUSING AT TOTAL 20ML HR....  Dr. Cathie Olden order allow NS to bolus during procedure....very dry,NS bolus 250 ml total..pt responding well to meds..  . Cardioversion N/A 11/12/2013    Procedure: CARDIOVERSION;  Surgeon: Thayer Headings, MD;  Location: [facility] ENDOSCOPY;  Service: Cardiovascular;  Laterality: N/A;  10:08  Dr. Marissa Nestle, anesthesia present, Lido   60mg ,  propofol 50mg , IV for elective cardioversion....Dr. Cathie Olden delievered synch  120 joules with successful cardioversion to NSR  . Cardioversion N/A 11/12/2013    Procedure: CARDIOVERSION;  Surgeon: Thayer Headings, MD;  Location: [place];  Service: Cardiovascular;  Laterality: N/A;  . Cardioversion N/A 11/14/2013    Procedure: CARDIOVERSION (BEDSIDE);  Surgeon: Larey Dresser, MD;  Location: [place];  Service: Cardiovascular;  Laterality: N/A;  Shanda Howells a cath placed    . Mastectomy  July 2002    left  . Cardiac catheterization  2015    Family History  Problem Relation Age of Onset  . Dementia Sister   . Diabetes Sister   . Diabetes Brother   . Heart disease Brother   . Liver cancer Mother   . Diabetes Mother   .  Cancer Mother     pancreatic  . Diabetes Father   . Heart disease Father    Social History:  reports that she quit smoking about 12 years ago. She has never used smokeless tobacco. She reports that she does not drink alcohol or use illicit drugs.  Allergies:  Allergies  Allergen Reactions  . Codeine Phosphate Other (See Comments)    Weird feeling "    Medications Prior to Admission  Medication Sig Dispense Refill  . amiodarone (PACERONE) 200 MG tablet Take 200 mg by mouth 2 (two) times daily.       Marland Kitchen atorvastatin (LIPITOR) 20 MG tablet Take 1 tablet (20 mg total) by mouth at bedtime.  30 tablet  5  . calcium carbonate (OS-CAL) 600 MG TABS Take 1,200 mg by mouth daily with breakfast.       . Cholecalciferol (VITAMIN D) 2000 UNITS tablet Take 2,000 Units by mouth daily.       . cyclobenzaprine (FLEXERIL) 10 MG tablet Take 1 tablet (10 mg total) by mouth 3 (three) times daily as needed for muscle spasms.  30 tablet  2  . digoxin (LANOXIN) 0.125 MG tablet Take 1 tablet (0.125 mg total) by mouth daily.  30 tablet  5  . fluticasone (FLONASE) 50 MCG/ACT nasal spray Place 1 spray into both nostrils daily as needed for allergies.      . furosemide (LASIX) 20 MG tablet Take 3 tablets (60 mg total) by mouth 2 (two) times daily.  100 tablet  0  . Hydrocodone-Acetaminophen 5-300 MG TABS Take 1 tablet by mouth daily as needed (pain).       . insulin detemir (LEVEMIR) 100 UNIT/ML injection Inject 0.1 mLs (10 Units total) into the skin at bedtime.  10 mL  11  . losartan (COZAAR) 50 MG tablet Take 25 mg by mouth 2 (two) times daily.      . metoprolol succinate (TOPROL-XL) 25 MG 24 hr tablet Take 1 tablet (25 mg total) by mouth 2 (two) times daily.  60 tablet  5  . Multiple Vitamin (MULTIVITAMIN) tablet Take 1 tablet by mouth daily.       . potassium chloride SA (K-DUR,KLOR-CON) 20 MEQ tablet Take 1 tablet (20 mEq total) by mouth 2 (two) times daily.  60 tablet  11  . Rivaroxaban (XARELTO) 20 MG TABS  tablet Take 1 tablet (20 mg total) by mouth daily with supper. Restart on Saturday am 4/11  30 tablet  5  . spironolactone (ALDACTONE) 25 MG tablet Take 1 tablet (25 mg total) by mouth daily.  30 tablet  5  . Insulin Pen Needle 31G X 5 MM MISC 1 each by Does not apply route daily.  100 each  3  Results for orders placed during the hospital encounter of 03/19/14 (from the past 48 hour(s))  GLUCOSE, CAPILLARY     Status: Abnormal   Collection Time    03/19/14  6:20 AM      Result Value Ref Range   Glucose-Capillary 191 (*) 70 - 99 mg/dL   No results found.  Review of Systems  HENT: Negative.   Eyes: Negative.   Respiratory: Negative.   Cardiovascular:       Atrial fibrillation on salt oh  Gastrointestinal: Negative.   Musculoskeletal: Positive for back pain.  Skin: Negative.   Neurological: Positive for weakness.       Leg weakness bilaterally inability to walk distances.  Endo/Heme/Allergies:       On his arrival to  Psychiatric/Behavioral: Negative.     Blood pressure 142/68, pulse 69, temperature 98 F (36.7 C), resp. rate 20, height 5\' 6"  (1.676 m), weight 107.956 kg (238 lb), SpO2 97.00%. Physical Exam  Constitutional: She appears well-developed.  Neck: Normal range of motion.  Cardiovascular:  Irregular heart rate with irregularly irregular pulse  Respiratory: Effort normal and breath sounds normal.  GI: Soft. Bowel sounds are normal.  Neurological:  Motor strength appears normal and iliopsoas quad tibialis anterior and gastrocs to confrontation however patient cannot toe or heel walk very well. She also has a slightly wide-based gait. Deep tendon reflexes are absent in the patellae and the Achilles both. Sensation appears diminished slightly more left than on the right  Psychiatric: She has a normal mood and affect. Her behavior is normal. Thought content normal.     Assessment/Plan Number spinal stenosis L1-L2 3 L3-4 L4-5.  Laminotomies and foraminotomies L1-L2  3 L3-4 L4-5.  Cynthia Herrera 03/19/2014, 7:56 AM

## 2014-03-19 NOTE — Telephone Encounter (Signed)
Relevant patient education mailed to patient.  

## 2014-03-19 NOTE — Anesthesia Preprocedure Evaluation (Addendum)
Anesthesia Evaluation  Patient identified by MRN, date of birth, ID band Patient awake    Reviewed: Allergy & Precautions, H&P , NPO status , Patient's Chart, lab work & pertinent test results, reviewed documented beta blocker date and time   Airway Mallampati: III      Dental  (+) Teeth Intact   Pulmonary shortness of breath and with exertion, COPDformer smoker,  breath sounds clear to auscultation        Cardiovascular hypertension, Pt. on medications and Pt. on home beta blockers + CAD and +CHF + dysrhythmias Atrial Fibrillation Rhythm:Regular Rate:Normal  EF 15-20%   Neuro/Psych    GI/Hepatic GERD-  ,  Endo/Other  diabetes, Type 2, Insulin Dependent  Renal/GU      Musculoskeletal   Abdominal   Peds  Hematology   Anesthesia Other Findings Breast cancer: chemo, surgery  Reproductive/Obstetrics                      Anesthesia Physical Anesthesia Plan  ASA: III  Anesthesia Plan: General   Post-op Pain Management:    Induction: Intravenous  Airway Management Planned: Oral ETT  Additional Equipment:   Intra-op Plan:   Post-operative Plan: Extubation in OR  Informed Consent: I have reviewed the patients History and Physical, chart, labs and discussed the procedure including the risks, benefits and alternatives for the proposed anesthesia with the patient or authorized representative who has indicated his/her understanding and acceptance.   Dental advisory given  Plan Discussed with: CRNA and Anesthesiologist  Anesthesia Plan Comments:        Anesthesia Quick Evaluation

## 2014-03-20 LAB — BASIC METABOLIC PANEL
BUN: 18 mg/dL (ref 6–23)
CALCIUM: 8.5 mg/dL (ref 8.4–10.5)
CHLORIDE: 101 meq/L (ref 96–112)
CO2: 24 mEq/L (ref 19–32)
CREATININE: 1.06 mg/dL (ref 0.50–1.10)
GFR calc non Af Amer: 53 mL/min — ABNORMAL LOW (ref >90)
GFR, EST AFRICAN AMERICAN: 61 mL/min — AB (ref >90)
Glucose, Bld: 174 mg/dL — ABNORMAL HIGH (ref 70–99)
Potassium: 4.1 mEq/L (ref 3.7–5.3)
Sodium: 138 mEq/L (ref 137–147)

## 2014-03-20 LAB — GLUCOSE, CAPILLARY
GLUCOSE-CAPILLARY: 172 mg/dL — AB (ref 70–99)
GLUCOSE-CAPILLARY: 149 mg/dL — AB (ref 70–99)
GLUCOSE-CAPILLARY: 141 mg/dL — AB (ref 70–99)
Glucose-Capillary: 170 mg/dL — ABNORMAL HIGH (ref 70–99)
Glucose-Capillary: 162 mg/dL — ABNORMAL HIGH (ref 70–99)

## 2014-03-20 LAB — CBC
HCT: 27.2 % — ABNORMAL LOW (ref 36.0–46.0)
Hemoglobin: 9.1 g/dL — ABNORMAL LOW (ref 12.0–15.0)
MCH: 30.5 pg (ref 26.0–34.0)
MCHC: 33.5 g/dL (ref 30.0–36.0)
MCV: 91.3 fL (ref 78.0–100.0)
Platelets: 249 10*3/uL (ref 150–400)
RBC: 2.98 MIL/uL — ABNORMAL LOW (ref 3.87–5.11)
RDW: 14.4 % (ref 11.5–15.5)
WBC: 9.9 10*3/uL (ref 4.0–10.5)

## 2014-03-20 NOTE — Evaluation (Signed)
Physical Therapy Evaluation Patient Details Name: Cynthia Herrera MRN: [id number] DOB: 08-26-1946 Today's Date: 03/20/2014   History of Present Illness  Bilateral laminotomies and decompression at L1-L2 3 L3-4 L4-5,   Clinical Impression  Patient demonstrates deficits in mobility as indicated below. Will benefit from continued skilled PT to address deficits and maximize function. Will see as indicated and progress as tolerated.     Follow Up Recommendations No PT follow up;Supervision/Assistance - 24 hour    Equipment Recommendations  Rolling walker with 5" wheels;3in1 (PT)    Recommendations for Other Services       Precautions / Restrictions Precautions Precautions: Back Precaution Booklet Issued: Yes (comment) Precaution Comments: educated and verbally reviewed with patient      Mobility  Bed Mobility Overal bed mobility: Needs Assistance Bed Mobility: Sidelying to Sit   Sidelying to sit: Min assist       General bed mobility comments: vc for precautions  Transfers Overall transfer level: Needs assistance Equipment used: 1 person hand held assist Transfers: Stand Pivot Transfers;Sit to/from Stand Sit to Stand: Min assist Stand pivot transfers: Min assist          Ambulation/Gait Ambulation/Gait assistance: Min guard;Min assist Ambulation Distance (Feet): 6 Feet Assistive device: 1 person hand held assist       General Gait Details: pivotal steps to chair  Stairs            Wheelchair Mobility    Modified Rankin (Stroke Patients Only)       Balance Overall balance assessment: Needs assistance   Sitting balance-Leahy Scale: Good     Standing balance support: During functional activity;Bilateral upper extremity supported Standing balance-Leahy Scale: Fair                               Pertinent Vitals/Pain No pain    Home Living Family/patient expects to be discharged to:: Private residence Living Arrangements:  Alone Available Help at Discharge: Family Type of Home: House Home Access: Stairs to enter Entrance Stairs-Rails: None Entrance Stairs-Number of Steps: 1 Home Layout: One level Home Equipment: Environmental consultant - 2 wheels;Cane - single point Additional Comments: tub shower with curtain, standard toilets.    Prior Function Level of Independence: Independent               Hand Dominance   Dominant Hand: Right    Extremity/Trunk Assessment   Upper Extremity Assessment: Overall WFL for tasks assessed           Lower Extremity Assessment: Defer to PT evaluation      Cervical / Trunk Assessment: Normal  Communication   Communication: No difficulties  Cognition Arousal/Alertness: Awake/alert Behavior During Therapy: WFL for tasks assessed/performed Overall Cognitive Status: Within Functional Limits for tasks assessed                      General Comments      Exercises        Assessment/Plan    PT Assessment Patient needs continued PT services  PT Diagnosis Difficulty walking;Abnormality of gait;Generalized weakness;Acute pain   PT Problem List Decreased activity tolerance;Decreased mobility;Decreased knowledge of use of DME;Pain  PT Treatment Interventions DME instruction;Gait training;Functional mobility training;Therapeutic activities;Therapeutic exercise;Balance training;Patient/family education   PT Goals (Current goals can be found in the Care Plan section) Acute Rehab PT Goals Patient Stated Goal: to get back to taking care of my flowers PT Goal Formulation: With  patient Time For Goal Achievement: 04/03/14 Potential to Achieve Goals: Good    Frequency Min 5X/week   Barriers to discharge        Co-evaluation               End of Session Equipment Utilized During Treatment: Gait belt Activity Tolerance: Patient tolerated treatment well Patient left: in chair;Other (comment) (with OT) Nurse Communication: Mobility status         Time:  [phone] PT Time Calculation (min): 24 min   Charges:   PT Evaluation $Initial PT Evaluation Tier I: 1 Procedure PT Treatments $Therapeutic Activity: 8-22 mins   PT G Codes:          Duncan Dull 03/20/2014, 10:17 AM Alben Deeds, PT DPT  [phone]

## 2014-03-20 NOTE — Progress Notes (Signed)
Occupational Therapy Evaluation Patient Details Name: Cynthia Herrera MRN: [id number] DOB: 02-14-1946 Today's Date: 03/20/2014    History of Present Illness Bilateral laminotomies and decompression at L1-L2 3 L3-4 L4-5,    Clinical Impression   PTA, pt independent with ADL and mobility. Pt will have 24/7 S after D/C initially. Pt needs 3 in1 for D/C. Will follow to educate on back precautions with nec DME and AE to maximize independence with ADL and functional mobility to facilitate safe D/C home with initial 24/7 S of family.     Follow Up Recommendations  No OT follow up;Supervision - Intermittent    Equipment Recommendations  3 in 1 bedside comode    Recommendations for Other Services       Precautions / Restrictions Precautions Precautions: Back Precaution Booklet Issued: Yes (comment)      Mobility Bed Mobility Overal bed mobility: Needs Assistance Bed Mobility: Sidelying to Sit   Sidelying to sit: Min assist       General bed mobility comments: vc for precautions  Transfers Overall transfer level: Needs assistance   Transfers: Stand Pivot Transfers;Sit to/from Stand Sit to Stand: Min assist Stand pivot transfers: Min assist            Balance Overall balance assessment: Needs assistance   Sitting balance-Leahy Scale: Good     Standing balance support: During functional activity;Bilateral upper extremity supported Standing balance-Leahy Scale: Fair                              ADL Overall ADL's : Needs assistance/impaired Eating/Feeding: Independent   Grooming: Set up   Upper Body Bathing: Supervision/ safety   Lower Body Bathing: Maximal assistance;Sit to/from stand   Upper Body Dressing : Minimal assistance   Lower Body Dressing: Maximal assistance   Toilet Transfer: Minimal assistance   Toileting- Clothing Manipulation and Hygiene: Moderate assistance       Functional mobility during ADLs: Minimal assistance General  ADL Comments: Began education on back precautions for ADL. Pt has reachers     Vision                     Perception     Praxis      Pertinent Vitals/Pain BP 92/41 sitting O2 98 2L HR 61     Hand Dominance Right   Extremity/Trunk Assessment Upper Extremity Assessment Upper Extremity Assessment: Overall WFL for tasks assessed   Lower Extremity Assessment Lower Extremity Assessment: Defer to PT evaluation   Cervical / Trunk Assessment Cervical / Trunk Assessment: Normal   Communication Communication Communication: No difficulties   Cognition Arousal/Alertness: Awake/alert Behavior During Therapy: WFL for tasks assessed/performed Overall Cognitive Status: Within Functional Limits for tasks assessed                     General Comments       Exercises       Shoulder Instructions      Home Living Family/patient expects to be discharged to:: Private residence Living Arrangements: Alone Available Help at Discharge: Family Type of Home: House Home Access: Stairs to enter Technical brewer of Steps: 1 Entrance Stairs-Rails: None Home Layout: One level     Bathroom Shower/Tub: Tub/shower unit Shower/tub characteristics: Architectural technologist: Standard Bathroom Accessibility: Yes How Accessible: Accessible via walker Home Equipment: Gwynn - 2 wheels;Cane - single point   Additional Comments: tub shower with curtain, standard toilets.  Prior Functioning/Environment Level of Independence: Independent             OT Diagnosis: Generalized weakness;Acute pain   OT Problem List: Decreased activity tolerance;Decreased safety awareness;Decreased knowledge of use of DME or AE;Decreased knowledge of precautions;Obesity;Pain   OT Treatment/Interventions: Self-care/ADL training;DME and/or AE instruction;Therapeutic activities;Energy conservation;Patient/family education    OT Goals(Current goals can be found in the care plan  section) Acute Rehab OT Goals Patient Stated Goal: to get back to taking care of my flowers OT Goal Formulation: With patient Time For Goal Achievement: 04/03/14 Potential to Achieve Goals: Good  OT Frequency: Min 3X/week   Barriers to D/C:            Co-evaluation              End of Session Equipment Utilized During Treatment: Gait belt Nurse Communication: Mobility status;Other (comment) (BP)  Activity Tolerance: Patient tolerated treatment well Patient left: in chair;with call bell/phone within reach   Time: 0850-0915 OT Time Calculation (min): 25 min Charges:  OT General Charges $OT Visit: 1 Procedure OT Evaluation $Initial OT Evaluation Tier I: 1 Procedure OT Treatments $Self Care/Home Management : 8-22 mins G-Codes:    Roney Jaffe Elijahjames Fuelling 2014-04-01, 9:37 AM   Maurie Boettcher, OTR/L  [phone] 04-01-14

## 2014-03-20 NOTE — Clinical Social Work Note (Signed)
Clinical Social Worker received referral for possible ST-SNF placement.  Chart reviewed.  PT/OT recommending no follow up at this time.  Spoke with RN Case Manager who will follow up with patient if needed prior to discharge.    CSW signing off - please re consult if social work needs arise.  Barbette Or, [place]

## 2014-03-20 NOTE — Progress Notes (Signed)
Subjective: Patient reports Feels fair. Legs feel wobbly but no pain.  Objective: Vital signs in last 24 hours: Temp:  [97.6 F (36.4 C)-101.1 F (38.4 C)] 98.2 F (36.8 C) (05/27 1600) Pulse Rate:  [61-87] 80 (05/27 1800) Resp:  [14-29] 28 (05/27 1800) BP: (87-126)/(33-100) 108/43 mmHg (05/27 1800) SpO2:  [92 %-100 %] 97 % (05/27 1800) Arterial Line BP: (93-170)/(37-64) 159/52 mmHg (05/27 1800) Weight:  [109.6 kg (241 lb 10 oz)] 109.6 kg (241 lb 10 oz) (05/27 0600)  Intake/Output from previous day: 05/26 0701 - 05/27 0700 In: 2560 [I.V.:1710; IV Piggyback:850] Out: 8250 [Urine:995; Blood:450] Intake/Output this shift:    Motor function iliopsoas quadriceps 4/5 tibialis anterior gastrocs 5 out of 5. Incision is clean and dry  Lab Results:  Recent Labs  03/20/14 0610  WBC 9.9  HGB 9.1*  HCT 27.2*  PLT 249   BMET  Recent Labs  03/20/14 0610  NA 138  K 4.1  CL 101  CO2 24  GLUCOSE 174*  BUN 18  CREATININE 1.06  CALCIUM 8.5    Studies/Results: Dg Lumbar Spine 1 View  03/19/2014   CLINICAL DATA:  Intraoperative localization, laminectomy  EXAM: LUMBAR SPINE - 1 VIEW  COMPARISON:  01/17/2013, 02/28/2014  FINDINGS: Five lumbar type vertebral bodies are well visualized. A surgical instrument is noted in the soft tissues and posterior elements directed at the L1-2 interspace. Multilevel disc space narrowing is noted similar to that seen on prior MRI examination. No gross soft tissue abnormality is seen.  IMPRESSION: Surgical instrument directed to the L1-2 interspace. These findings were communicated with Dr. Ellene Route at time of exam interpretation.   Electronically Signed   By: Inez Catalina M.D.   On: 03/19/2014 09:03    Assessment/Plan: Stable postop day 1. DC A-line transfer to floor in morning   LOS: 1 day  Transferred to the floor in a   [employer] 03/20/2014, 8:01 PM

## 2014-03-20 NOTE — Progress Notes (Signed)
UR completed.  Pratyush Ammon, RN BSN [facility] CCM Trauma/Neuro ICU Case Manager [phone]  

## 2014-03-21 ENCOUNTER — Encounter (HOSPITAL_COMMUNITY): Payer: Self-pay | Admitting: Neurological Surgery

## 2014-03-21 LAB — GLUCOSE, CAPILLARY
GLUCOSE-CAPILLARY: 208 mg/dL — AB (ref 70–99)
GLUCOSE-CAPILLARY: 153 mg/dL — AB (ref 70–99)
Glucose-Capillary: 141 mg/dL — ABNORMAL HIGH (ref 70–99)
Glucose-Capillary: 213 mg/dL — ABNORMAL HIGH (ref 70–99)
Glucose-Capillary: 158 mg/dL — ABNORMAL HIGH (ref 70–99)

## 2014-03-21 NOTE — Progress Notes (Signed)
Report given to 4N nurse. All questions addressed. Pt transported to room 4N29 with all belongings, verified/ acknowledged by daughter.

## 2014-03-21 NOTE — Progress Notes (Signed)
Subjective: Patient reports Feels better in her legs. Some occasional return of preoperative pain was sharp stabbing and lancinating pain into the legs and back  Objective: Vital signs in last 24 hours: Temp:  [97.8 F (36.6 C)-100.8 F (38.2 C)] 97.8 F (36.6 C) (05/28 0825) Pulse Rate:  [73-87] 73 (05/28 1800) Resp:  [18-32] 19 (05/28 1800) BP: (89-125)/(30-78) 110/40 mmHg (05/28 1800) SpO2:  [94 %-100 %] 100 % (05/28 1800) Arterial Line BP: (116)/(39) 116/39 mmHg (05/27 2000)  Intake/Output from previous day: 05/27 0701 - 05/28 0700 In: 840 [P.O.:840] Out: 900 [Urine:900] Intake/Output this shift:    Incision is clean and dry. Dressing is removed. Motor function stable and lower external  Lab Results:  Recent Labs  03/20/14 0610  WBC 9.9  HGB 9.1*  HCT 27.2*  PLT 249   BMET  Recent Labs  03/20/14 0610  NA 138  K 4.1  CL 101  CO2 24  GLUCOSE 174*  BUN 18  CREATININE 1.06  CALCIUM 8.5    Studies/Results: No results found.  Assessment/Plan: Stable postop.  LOS: 2 days  Plan discharge tomorrow after patient's equipment needs are met for home use which include wide 3 in one commode chair and walker.   Kristeen Miss 03/21/2014, 7:34 PM

## 2014-03-21 NOTE — Progress Notes (Signed)
Physical Therapy Treatment Patient Details Name: Cynthia Herrera MRN: [id number] DOB: 18-Mar-1946 Today's Date: 03/21/2014    History of Present Illness Bilateral laminotomies and decompression at L1-L2 3 L3-4 L4-5,     PT Comments    Patient ambulated in hall today with some instability noted. Continues to require reinforcement of precautions. Will progress with activity as tolerated.  Follow Up Recommendations  No PT follow up;Supervision/Assistance - 24 hour     Equipment Recommendations  Rolling walker with 5" wheels;3in1 (PT)    Recommendations for Other Services       Precautions / Restrictions Precautions Precautions: Back Precaution Booklet Issued: Yes (comment) Precaution Comments: educated and verbally reviewed with patient    Mobility  Bed Mobility Overal bed mobility: Needs Assistance Bed Mobility: Sidelying to Sit   Sidelying to sit: Min assist       General bed mobility comments: vc for precautions  Transfers Overall transfer level: Needs assistance Equipment used: Rolling walker (2 wheeled);1 person hand held assist Transfers: Sit to/from Stand Sit to Stand: Min assist Stand pivot transfers: Min assist       General transfer comment: performed from bed x2 and commode x1 (increased time to accomodate for dizziness)  Ambulation/Gait Ambulation/Gait assistance: Min guard Ambulation Distance (Feet): 70 Feet Assistive device: Rolling walker (2 wheeled) Gait Pattern/deviations: Antalgic;Decreased stride length;Drifts right/left;Trunk flexed Gait velocity: decreased Gait velocity interpretation: Below normal speed for age/gender General Gait Details: Patient with poor flexed posture during ambulation, 3 incidents of LE spasms during ambulation causing patient to buckle, able to self support using RW. standing rest breaks after each incident.    Stairs            Wheelchair Mobility    Modified Rankin (Stroke Patients Only)        Balance     Sitting balance-Leahy Scale: Good       Standing balance-Leahy Scale: Fair                      Cognition Arousal/Alertness: Awake/alert Behavior During Therapy: WFL for tasks assessed/performed Overall Cognitive Status: Within Functional Limits for tasks assessed                      Exercises      General Comments        Pertinent Vitals/Pain States no pain, but reports some "soreness" in low back    Home Living                      Prior Function            PT Goals (current goals can now be found in the care plan section) Acute Rehab PT Goals Patient Stated Goal: to get back to taking care of my flowers PT Goal Formulation: With patient Time For Goal Achievement: 04/03/14 Potential to Achieve Goals: Good Progress towards PT goals: Progressing toward goals    Frequency  Min 5X/week    PT Plan Current plan remains appropriate    Co-evaluation             End of Session Equipment Utilized During Treatment: Gait belt Activity Tolerance: Patient tolerated treatment well Patient left: in chair;Other (comment)     Time: 6314-9702 PT Time Calculation (min): 26 min  Charges:  $Gait Training: 8-22 mins $Therapeutic Activity: 8-22 mins  G Codes:      Duncan Dull 03/21/2014, 9:43 AM Alben Deeds, PT DPT  [phone]

## 2014-03-21 NOTE — Progress Notes (Addendum)
Occupational Therapy Treatment Patient Details Name: Cynthia Herrera MRN: [id number] DOB: 23-Nov-1945 Today's Date: 03/21/2014    History of present illness Bilateral laminotomies and decompression at L1-L2 3 L3-4 L4-5,    OT comments  Pt making steady progress. Discussed S/C plans with pt/fmaily. Pt will need tub bench and 3 in 1 for D/C. Also discussed nrec AE. Feel pt will [facility] from [facility]. Will continue to follow to facilitate safe D/C.   Follow Up Recommendations  HHOT;Supervision - Intermittent    Equipment Recommendations  3 in 1 bedside comode;Tub/shower bench    Recommendations for Other Services      Precautions / Restrictions Precautions Precautions: Back Precaution Booklet Issued: Yes (comment) Precaution Comments: educated and verbally reviewed with patient       Mobility Bed Mobility Overal bed mobility: Needs Assistance Bed Mobility: Sit to Sidelying   Sidelying to sit: Min assist;HOB elevated       General bed mobility comments:  (assist due to difficulty lifting LLE)  Transfers Overall transfer level: Needs assistance   Transfers: Sit to/from Stand;Stand Pivot Transfers Sit to Stand: Min assist Stand pivot transfers: Min assist       General transfer comment: performed from bed x2 and commode x1    Balance                                   ADL Overall ADL's : Needs assistance/impaired                     Lower Body Dressing: Minimal assistance;Sit to/from stand (with use of reacher)   Toilet Transfer: Minimal assistance;Stand-pivot;Cueing for safety;BSC   Toileting- Clothing Manipulation and Hygiene: Moderate assistance (difficulty reaching due to size)       Functional mobility during ADLs: Minimal assistance General ADL Comments: Reinforced back precautions and reviewed need for AE. Pt will need tub bnech and 3 in 1      Vision                     Perception     Praxis      Cognition    Behavior During Therapy: [facility] for tasks assessed/performed Overall Cognitive Status: Within Functional Limits for tasks assessed                       Extremity/Trunk Assessment               Exercises     Shoulder Instructions       General Comments      Pertinent Vitals/ Pain       VSS       No c/o pain  Home Living                                          Prior Functioning/Environment              Frequency Min 3X/week     Progress Toward Goals  OT Goals(current goals can now be found in the care plan section)  Progress towards OT goals: Progressing toward goals  Acute Rehab OT Goals Patient Stated Goal: to get back to taking care of my flowers OT Goal Formulation: With patient Time For Goal Achievement: 04/03/14 Potential to Achieve Goals: Good ADL Goals  Pt Will Perform Lower Body Bathing: with supervision;with adaptive equipment;sit to/from stand Pt Will Perform Lower Body Dressing: with supervision;with adaptive equipment;sit to/from stand Pt Will Transfer to Toilet: with modified independence;bedside commode;ambulating Pt Will Perform Toileting - Clothing Manipulation and hygiene: with modified independence;with adaptive equipment;sit to/from stand Additional ADL Goal #1: Pt will independently verbalize 3/3 back precautions.  Plan Discharge plan remains appropriate    Co-evaluation                 End of Session Equipment Utilized During Treatment: Gait belt   Activity Tolerance Patient tolerated treatment well   Patient Left in bed;with call bell/phone within reach   Nurse Communication Mobility status        Time: 2595-6387 OT Time Calculation (min): 31 min  Charges: OT General Charges $OT Visit: 1 Procedure OT Treatments $Self Care/Home Management : 23-37 mins  Roney Jaffe Kea Callan 03/21/2014, 2:52 PM   [facility], OTR/L  [phone] 03/21/2014

## 2014-03-22 LAB — GLUCOSE, CAPILLARY
Glucose-Capillary: 123 mg/dL — ABNORMAL HIGH (ref 70–99)
Glucose-Capillary: 160 mg/dL — ABNORMAL HIGH (ref 70–99)

## 2014-03-22 LAB — POCT I-STAT 4, (NA,K, GLUC, HGB,HCT)
Glucose, Bld: 211 mg/dL — ABNORMAL HIGH (ref 70–99)
HCT: 33 % — ABNORMAL LOW (ref 36.0–46.0)
HEMOGLOBIN: 11.2 g/dL — AB (ref 12.0–15.0)
POTASSIUM: 3.5 meq/L — AB (ref 3.7–5.3)
Sodium: 136 mEq/L — ABNORMAL LOW (ref 137–147)

## 2014-03-22 MED ORDER — METHOCARBAMOL 500 MG PO TABS: 500.0000 mg | ORAL_TABLET | Freq: Four times a day (QID) | ORAL | Status: DC | PRN

## 2014-03-22 MED ORDER — OXYCODONE-ACETAMINOPHEN 5-325 MG PO TABS: 1.0000 | ORAL_TABLET | ORAL | Status: DC | PRN

## 2014-03-22 MED ORDER — MELOXICAM 15 MG PO TABS: 15.0000 mg | ORAL_TABLET | Freq: Every day | ORAL | Status: DC

## 2014-03-22 MED ORDER — KETOROLAC TROMETHAMINE 60 MG/2ML IM SOLN
60.0000 mg | Freq: Once | INTRAMUSCULAR | Status: DC
  Filled 2014-03-22: qty 2

## 2014-03-22 NOTE — Progress Notes (Signed)
Spoke at length with pt about home therapy needs and DME.  Pt reports having had knee replacement surgery in 2006 and wished to use the agency for HHPT/OT that she used then although she didn't remember the name.  I made some calls and that agency was [place].  Required documents faxed to their intake number, [phone].    [facility] hospital rep contacted to deliver the rolling walker, 3-in-1 and tub bench to the pt's room prior to d/c today.   Pt complaining of great pain through the night, stating that it is good she didn't go home yesterday as planned because even with the pain pills and muscle relaxers overnight, if she had been at home feeling the way she did, she'd have called EMS to bring her back to the hospital.  Assigned RN aware and will share this with MD on his rounds.

## 2014-03-22 NOTE — Discharge Summary (Signed)
Physician Discharge Summary  Patient ID: Cynthia Herrera MRN: [id number] DOB/AGE: Apr 18, 1946 68 y.o.  Admit date: 03/19/2014 Discharge date: 03/22/2014  Admission Diagnoses: Lumbar stenosis  Discharge Diagnoses: Lumbar stenosis L1-L2 3 L3-4 L4-5 with lumbar radiculopathy. Diabetes mellitus. Atrial fibrillation, anticoagulated status. Active Problems:   Lumbar stenosis   Discharged Condition: fair  Hospital Course: Patient was admitted to undergo surgical decompression at L1-L2 3 L3-4 and L4-5. She has underlying history of diabetes and has atrial fibrillation. She had been on blood thinning medication. She was therefore admitted for observation and initially was maintained in the intensive care unit. Her cardiac status remained stable. She is transferred to the floor. She is discharged home now.  Consults: None  Significant Diagnostic Studies: None  Treatments: surgery: Bilateral laminotomies L1-L2 3 L3-4 and L4-5. Decompression of L1-L2 L3-L4 and L5 nerve roots.  Discharge Exam: Blood pressure 108/43, pulse 83, temperature 98.5 F (36.9 C), temperature source Oral, resp. rate 20, height 5\' 6"  (1.676 m), weight 109.6 kg (241 lb 10 oz), SpO2 97.00%. Incision is clean and dry, motor function is intact in lower extremities in iliopsoas quadriceps tibialis anterior and gastrocs.  Disposition: 01-Home or Self Care  Discharge Instructions   Call MD for:  redness, tenderness, or signs of infection (pain, swelling, redness, odor or green/yellow discharge around incision site)    Complete by:  As directed      Call MD for:  severe uncontrolled pain    Complete by:  As directed      Call MD for:  temperature >100.4    Complete by:  As directed      Diet - low sodium heart healthy    Complete by:  As directed      Discharge instructions    Complete by:  As directed   Okay to shower. Do not apply salves or appointments to incision. No heavy lifting with the upper extremities greater than  15 pounds. May resume driving when not requiring pain medication and patient feels comfortable with doing so.     Increase activity slowly    Complete by:  As directed             Medication List         amiodarone 200 MG tablet  Commonly known as:  PACERONE  Take 200 mg by mouth 2 (two) times daily.     atorvastatin 20 MG tablet  Commonly known as:  LIPITOR  Take 1 tablet (20 mg total) by mouth at bedtime.     calcium carbonate 600 MG Tabs tablet  Commonly known as:  OS-CAL  Take 1,200 mg by mouth daily with breakfast.     cyclobenzaprine 10 MG tablet  Commonly known as:  FLEXERIL  Take 1 tablet (10 mg total) by mouth 3 (three) times daily as needed for muscle spasms.     digoxin 0.125 MG tablet  Commonly known as:  LANOXIN  Take 1 tablet (0.125 mg total) by mouth daily.     fluticasone 50 MCG/ACT nasal spray  Commonly known as:  FLONASE  Place 1 spray into both nostrils daily as needed for allergies.     furosemide 20 MG tablet  Commonly known as:  LASIX  TAKE 3 TABLETS (60 MG TOTAL) BY MOUTH 2 (TWO) TIMES DAILY.     Hydrocodone-Acetaminophen 5-300 MG Tabs  Take 1 tablet by mouth daily as needed (pain).     insulin detemir 100 UNIT/ML injection  Commonly known as:  LEVEMIR  Inject 0.1 mLs (10 Units total) into the skin at bedtime.     Insulin Pen Needle 31G X 5 MM Misc  1 each by Does not apply route daily.     losartan 50 MG tablet  Commonly known as:  COZAAR  Take 25 mg by mouth 2 (two) times daily.     meloxicam 15 MG tablet  Commonly known as:  MOBIC  Take 1 tablet (15 mg total) by mouth daily.     methocarbamol 500 MG tablet  Commonly known as:  ROBAXIN  Take 1 tablet (500 mg total) by mouth every 6 (six) hours as needed for muscle spasms.     metoprolol succinate 25 MG 24 hr tablet  Commonly known as:  TOPROL-XL  Take 1 tablet (25 mg total) by mouth 2 (two) times daily.     multivitamin tablet  Take 1 tablet by mouth daily.      oxyCODONE-acetaminophen 5-325 MG per tablet  Commonly known as:  PERCOCET/ROXICET  Take 1-2 tablets by mouth every 4 (four) hours as needed for moderate pain.     potassium chloride SA 20 MEQ tablet  Commonly known as:  K-DUR,KLOR-CON  Take 1 tablet (20 mEq total) by mouth 2 (two) times daily.     rivaroxaban 20 MG Tabs tablet  Commonly known as:  XARELTO  Take 1 tablet (20 mg total) by mouth daily with supper. Restart on Saturday am 4/11     spironolactone 25 MG tablet  Commonly known as:  ALDACTONE  Take 1 tablet (25 mg total) by mouth daily.     Vitamin D 2000 UNITS tablet  Take 2,000 Units by mouth daily.         SignedKristeen Miss 03/22/2014, 3:06 PM

## 2014-03-22 NOTE — Progress Notes (Signed)
Occupational Therapy Treatment Patient Details Name: Cynthia Herrera MRN: [id number] DOB: [birth year] Today's Date: 03/22/2014    History of present illness Bilateral laminotomies and decompression at L1-L2 3 L3-4 L4-5,    OT comments  Reviewed back precautions and safety with BADLs, and bed mobility.  Practiced tub transfer with min guard assist.  Pt eager to discharge home.   Follow Up Recommendations  Supervision - Intermittent;Home health OT    Equipment Recommendations  3 in 1 bedside comode;Tub/shower bench    Recommendations for Other Services      Precautions / Restrictions Precautions Precautions: Back Precaution Booklet Issued: Yes (comment) Precaution Comments: Pt requires min verbal cues to recall precautions.  Pt unable to locate handout and does not recall receiving it.  New handout provided and reviewed back precautions, and technique for bed mobility, LB ADLs, sleeping, etc.       Mobility Bed Mobility               General bed mobility comments: Pt with questions about how to perform bed mobility.  Reviewed verbally and handout provided.  Pt refused actual practice  Transfers Overall transfer level: Needs assistance Equipment used: Rolling walker (2 wheeled) Transfers: Sit to/from [facility] Sit to Stand: Min guard Stand pivot transfers: Min guard            Balance           Standing balance support: Single extremity supported Standing balance-Leahy Scale: Fair                     ADL                           Toilet Transfer: Min guard;Ambulation;Comfort height toilet;BSC       Tub/ Shower Transfer: Tub transfer;Min guard;Ambulation;Tub bench   Functional mobility during ADLs: Min guard;Rolling walker General ADL Comments: Requires min verbal cues for back precautions.  Pt instructed in proper technique for tub transfer.  She inquired where to purchase LH sponge and information provided.         Vision                     Perception     Praxis      Cognition   Behavior During Therapy: [facility] for tasks assessed/performed Overall Cognitive Status: Within Functional Limits for tasks assessed                       Extremity/Trunk Assessment               Exercises     Shoulder Instructions       General Comments      Pertinent Vitals/ Pain       10/10 back pain.  RN notified.   Home Living                                          Prior Functioning/Environment              Frequency       Progress Toward Goals  OT Goals(current goals can now be found in the care plan section)  Progress towards OT goals: Progressing toward goals  ADL Goals Pt Will Perform Lower Body Bathing: with supervision;with adaptive equipment;sit to/from stand Pt Will  Perform Lower Body Dressing: with supervision;with adaptive equipment;sit to/from stand Pt Will Transfer to Toilet: with modified independence;bedside commode;ambulating Pt Will Perform Toileting - Clothing Manipulation and hygiene: with modified independence;with adaptive equipment;sit to/from stand Additional ADL Goal #1: Pt will independently verbalize 3/3 back precautions.  Plan Discharge plan remains appropriate    Co-evaluation                 End of Session     Activity Tolerance Patient tolerated treatment well   Patient Left in chair;with call bell/phone within reach;with family/visitor present   Nurse Communication Patient requests pain meds        Time: 2979-8921 OT Time Calculation (min): 25 min  Charges: OT General Charges $OT Visit: 1 Procedure OT Treatments $Self Care/Home Management : 23-37 mins  Anthonia Monger M Maribel Luis 03/22/2014, 5:00 PM

## 2014-03-26 ENCOUNTER — Telehealth: Payer: Self-pay | Admitting: Internal Medicine

## 2014-03-26 MED ORDER — GLUCOSE BLOOD VI STRP: 1.0000 | ORAL_STRIP | Freq: Every day | Status: DC | PRN

## 2014-03-26 NOTE — Telephone Encounter (Signed)
Pt notified test strips sent to pharmacy.

## 2014-03-26 NOTE — Telephone Encounter (Signed)
Pt is needing her strips sent to [facility] [address] [facility] not [facility] them and pt states she has not checked her sugar today,.

## 2014-03-26 NOTE — Telephone Encounter (Signed)
Pt notified Rx request sent to pharmacy.

## 2014-03-26 NOTE — Telephone Encounter (Signed)
Pt is needing new rx for one touch verio test strips. Pt stated she has the needles but didn't get the strips. Send to [facility].

## 2014-04-05 ENCOUNTER — Encounter: Payer: Self-pay | Admitting: Cardiology

## 2014-04-05 ENCOUNTER — Ambulatory Visit (INDEPENDENT_AMBULATORY_CARE_PROVIDER_SITE_OTHER): Payer: Medicare Other | Admitting: Cardiology

## 2014-04-05 VITALS — BP 130/60 | HR 60 | Ht 66.0 in | Wt 231.0 lb

## 2014-04-05 DIAGNOSIS — Z7901 Long term (current) use of anticoagulants: Secondary | ICD-10-CM

## 2014-04-05 DIAGNOSIS — I10 Essential (primary) hypertension: Secondary | ICD-10-CM

## 2014-04-05 DIAGNOSIS — I509 Heart failure, unspecified: Secondary | ICD-10-CM

## 2014-04-05 DIAGNOSIS — I428 Other cardiomyopathies: Secondary | ICD-10-CM

## 2014-04-05 DIAGNOSIS — R5383 Other fatigue: Secondary | ICD-10-CM

## 2014-04-05 DIAGNOSIS — I4891 Unspecified atrial fibrillation: Secondary | ICD-10-CM

## 2014-04-05 DIAGNOSIS — R5381 Other malaise: Secondary | ICD-10-CM

## 2014-04-05 DIAGNOSIS — I2510 Atherosclerotic heart disease of native coronary artery without angina pectoris: Secondary | ICD-10-CM

## 2014-04-05 DIAGNOSIS — I420 Dilated cardiomyopathy: Secondary | ICD-10-CM

## 2014-04-05 DIAGNOSIS — E785 Hyperlipidemia, unspecified: Secondary | ICD-10-CM

## 2014-04-05 DIAGNOSIS — I5022 Chronic systolic (congestive) heart failure: Secondary | ICD-10-CM

## 2014-04-05 LAB — CBC WITH DIFFERENTIAL/PLATELET
BASOS ABS: 0.1 10*3/uL (ref 0.0–0.1)
BASOS PCT: 2 % — AB (ref 0–1)
Eosinophils Absolute: 0.4 10*3/uL (ref 0.0–0.7)
Eosinophils Relative: 5 % (ref 0–5)
HCT: 34.1 % — ABNORMAL LOW (ref 36.0–46.0)
Hemoglobin: 11.4 g/dL — ABNORMAL LOW (ref 12.0–15.0)
Lymphocytes Relative: 16 % (ref 12–46)
Lymphs Abs: 1.1 10*3/uL (ref 0.7–4.0)
MCH: 28.9 pg (ref 26.0–34.0)
MCHC: 33.4 g/dL (ref 30.0–36.0)
MCV: 86.3 fL (ref 78.0–100.0)
MONO ABS: 1 10*3/uL (ref 0.1–1.0)
Monocytes Relative: 14 % — ABNORMAL HIGH (ref 3–12)
Neutro Abs: 4.5 10*3/uL (ref 1.7–7.7)
Neutrophils Relative %: 63 % (ref 43–77)
Platelets: 459 10*3/uL — ABNORMAL HIGH (ref 150–400)
RBC: 3.95 MIL/uL (ref 3.87–5.11)
RDW: 13.6 % (ref 11.5–15.5)
WBC: 7.1 10*3/uL (ref 4.0–10.5)

## 2014-04-05 LAB — BASIC METABOLIC PANEL
BUN: 29 mg/dL — AB (ref 6–23)
CALCIUM: 9.3 mg/dL (ref 8.4–10.5)
CO2: 23 mEq/L (ref 19–32)
CREATININE: 1.46 mg/dL — AB (ref 0.50–1.10)
Chloride: 100 mEq/L (ref 96–112)
Glucose, Bld: 200 mg/dL — ABNORMAL HIGH (ref 70–99)
Potassium: 4.5 mEq/L (ref 3.5–5.3)
Sodium: 134 mEq/L — ABNORMAL LOW (ref 135–145)

## 2014-04-05 LAB — TSH: TSH: 0.408 u[IU]/mL (ref 0.350–4.500)

## 2014-04-05 NOTE — Patient Instructions (Signed)
Will obtain labs today and call you with the results (bmet/tsh/cbc)  STOP [place]   Your physician recommends that you schedule a follow-up appointment in: 3 WEEKS

## 2014-04-05 NOTE — Progress Notes (Signed)
[address] [place], [place]  [address] Phone: [phone] Fax:  [phone]  Date:  04/05/2014   ID:  Cynthia, Herrera [birth year], MRN [id number]  PCP:  Nyoka Cowden, MD  Cardiologist:  [name] ,MD     History of Present Illness: Cynthia Herrera is a 68 y.o. 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 She was also in atrial fibrillation with RVR. She was diuresed and ruled out for MI. Echo showed EF 15-20%. She was started on Amiodarone and on 11/12/2013 underwent TEE/DCCV to NSR but failed to hold NSR. She was again cardioverted on 1/21 after more loading with amio but failed to convert. Later that day she spontaneously converted to NSR. Her LV dysfunction persisted after maintenance of NSR so she underwent cardiac cath which showed calcified coronary arteries with no obstructive lesions and mildly reduced LVF at 45% and normal right heart pressures.  She now presents back for followup.   She is doing well.  She denies any chest pain, SOB, DOE, LE edema, palpitations or syncope. She says that she has been feeling fatigued and lightheaded recently.  She has had some problems with her vision and is going to see her eye doctor.    Wt Readings from Last 3 Encounters:  04/05/14 231 lb (104.781 kg)  03/20/14 241 lb 10 oz (109.6 kg)  03/20/14 241 lb 10 oz (109.6 kg)     Past Medical History  Diagnosis Date  . Personal history of colonic polyps     adenomas 03 and 08  . Hyperlipidemia     takes Lipitor daily  . Osteopenia   . Vitamin D deficiency     takes Vit D  . Allergy     seasonal  . Arthritis   . Breast cancer     left: surgery,chemo,radiation  . Atrial fibrillation     s/p TEE DCCV and repeat DCCV 11/14/2013  . DCM (dilated cardiomyopathy)     EF 15-20% ? tachycardia induced  . Hypertension     takes  Losartan and Metoprolol.   Marland Kitchen Dysrhythmia     A fib-takes AMiodarone,Digoxin,and Xarelto daily  . Chronic systolic CHF (congestive heart failure)     takes Furosemide and Aldactone daily  . COPD (chronic obstructive pulmonary disease)     no inhalers   . History of bronchitis 15+yrs ago  . Joint pain   . Chronic back pain     lumbar stenosis  . Muscle spasm     takes Flexeril daily as needed   . GERD (gastroesophageal reflux disease)     once in a while;depends on what she eats  . Nocturia   . Cataracts, bilateral     immature  . Diabetes mellitus without complication     recent dx    Current Outpatient Prescriptions  Medication Sig Dispense Refill  . amiodarone (PACERONE) 200 MG tablet Take 200 mg by mouth 2 (two) times daily.       Marland Kitchen atorvastatin (LIPITOR) 20 MG tablet Take 1 tablet (20 mg total) by mouth at bedtime.  30 tablet  5  . calcium carbonate (OS-CAL) 600 MG TABS Take 1,200 mg by mouth daily with breakfast.       . Cholecalciferol (VITAMIN D) 2000 UNITS tablet Take 2,000 Units by mouth daily.       . cyclobenzaprine (FLEXERIL)  10 MG tablet Take 1 tablet (10 mg total) by mouth 3 (three) times daily as needed for muscle spasms.  30 tablet  2  . digoxin (LANOXIN) 0.125 MG tablet Take 1 tablet (0.125 mg total) by mouth daily.  30 tablet  5  . fluticasone (FLONASE) 50 MCG/ACT nasal spray Place 1 spray into both nostrils daily as needed for allergies.      . furosemide (LASIX) 20 MG tablet TAKE 3 TABLETS (60 MG TOTAL) BY MOUTH 2 (TWO) TIMES DAILY.  100 tablet  0  . glucose blood (ONETOUCH VERIO) test strip 1 each by Other route daily as needed for other.  100 each  12  . Hydrocodone-Acetaminophen 5-300 MG TABS Take 1 tablet by mouth daily as needed (pain).       . insulin detemir (LEVEMIR) 100 UNIT/ML injection Inject 0.1 mLs (10 Units total) into the skin at bedtime.  10 mL  11  . Insulin Pen Needle 31G X 5 MM MISC 1 each by Does not apply route daily.  100 each  3  .  losartan (COZAAR) 50 MG tablet Take 25 mg by mouth 2 (two) times daily.      . meloxicam (MOBIC) 15 MG tablet Take 1 tablet (15 mg total) by mouth daily.  30 tablet  3  . metoprolol succinate (TOPROL-XL) 25 MG 24 hr tablet Take 1 tablet (25 mg total) by mouth 2 (two) times daily.  60 tablet  5  . Multiple Vitamin (MULTIVITAMIN) tablet Take 1 tablet by mouth daily.       . potassium chloride SA (K-DUR,KLOR-CON) 20 MEQ tablet Take 1 tablet (20 mEq total) by mouth 2 (two) times daily.  60 tablet  11  . Rivaroxaban (XARELTO) 20 MG TABS tablet Take 1 tablet (20 mg total) by mouth daily with supper. Restart on Saturday am 4/11  30 tablet  5  . spironolactone (ALDACTONE) 25 MG tablet Take 1 tablet (25 mg total) by mouth daily.  30 tablet  5   No current facility-administered medications for this visit.    Allergies:    Allergies  Allergen Reactions  . Codeine Phosphate Other (See Comments)    Weird feeling "    Social History:  The patient  reports that she quit smoking about 12 years ago. She has never used smokeless tobacco. She reports that she does not drink alcohol or use illicit drugs.   Family History:  The patient's family history includes Cancer in her mother; Dementia in her sister; Diabetes in her brother, father, mother, and sister; Heart disease in her brother and father; Liver cancer in her mother.   ROS:  Please see the history of present illness.      All other systems reviewed and negative.   PHYSICAL EXAM: VS:  BP 130/60  Pulse 60  Ht 5\' 6"  (1.676 m)  Wt 231 lb (104.781 kg)  BMI 37.30 kg/m2 Well nourished, well developed, in no acute distress HEENT: normal Neck: no JVD Cardiac:  normal S1, S2; RRR; no murmur Lungs:  clear to auscultation bilaterally, no wheezing, rhonchi or rales Abd: soft, nontender, no hepatomegaly Ext: no edema Skin: warm and dry Neuro:  CNs 2-12 intact, no focal abnormalities noted       ASSESSMENT AND PLAN:  1. Nonobstructive ASCAD 2. Mild  LV dysfunction EF 45% by cath - presumed tachycardia induced from prior afib with RVR - EF has improved after maintenance of NSR.  3. Chronic systolic CHF well compensated -  continue beta blocker/ARB/lasix and Aldactone 4. PAF maintaining NSR post DCCV - continue metoprolol/Xarelto/Amio - stop digoxin since she is feeling tired and wiped out and her HR is around 60 5. HTN well controlled - continue Losartan/metoprolol/Aldactone 6. Fatigue ? Etiology - ? Bradycardia vs deconditioning from recent back surgery - stop dig - check BMET/TSH/CBC  Followup with me in 3 weeks  Signed, [name], MD 04/05/2014 3:20 PM

## 2014-04-08 ENCOUNTER — Other Ambulatory Visit: Payer: Self-pay | Admitting: General Surgery

## 2014-04-08 MED ORDER — FUROSEMIDE 20 MG PO TABS: 40.0000 mg | ORAL_TABLET | Freq: Two times a day (BID) | ORAL | Status: DC

## 2014-04-09 ENCOUNTER — Ambulatory Visit (INDEPENDENT_AMBULATORY_CARE_PROVIDER_SITE_OTHER): Payer: Medicare Other | Admitting: Internal Medicine

## 2014-04-09 ENCOUNTER — Encounter: Payer: Self-pay | Admitting: Internal Medicine

## 2014-04-09 VITALS — BP 100/60 | HR 66 | Temp 97.5°F | Resp 18 | Ht 66.0 in | Wt 235.0 lb

## 2014-04-09 DIAGNOSIS — I5022 Chronic systolic (congestive) heart failure: Secondary | ICD-10-CM

## 2014-04-09 DIAGNOSIS — I10 Essential (primary) hypertension: Secondary | ICD-10-CM

## 2014-04-09 DIAGNOSIS — E119 Type 2 diabetes mellitus without complications: Secondary | ICD-10-CM

## 2014-04-09 DIAGNOSIS — I509 Heart failure, unspecified: Secondary | ICD-10-CM

## 2014-04-09 DIAGNOSIS — I4891 Unspecified atrial fibrillation: Secondary | ICD-10-CM

## 2014-04-09 MED ORDER — GLIPIZIDE ER 2.5 MG PO TB24: 2.5000 mg | ORAL_TABLET | Freq: Every day | ORAL | Status: DC

## 2014-04-09 MED ORDER — METFORMIN HCL 500 MG PO TABS: 500.0000 mg | ORAL_TABLET | Freq: Two times a day (BID) | ORAL | Status: DC

## 2014-04-09 NOTE — Progress Notes (Signed)
Pre-visit discussion using our clinic review tool. No additional management support is needed unless otherwise documented below in the visit note.  

## 2014-04-09 NOTE — Patient Instructions (Addendum)
Taper and discontinue Levimir  Limit your sodium (Salt) intake   Please check your hemoglobin A1c every 3 months  Followup in 6 weeks

## 2014-04-09 NOTE — Progress Notes (Signed)
Subjective:    Patient ID: Cynthia Herrera, female    DOB: May 04, 1946, 68 y.o.   MRN: [id number]  HPI  Lab Results  Component Value Date   HGBA1C 6.5* 11/09/2013   68 year old patient who is in today postop for followup for diabetes.  She has done quite well following laminectomy for spinal stenosis and is quite pleased with the surgery.  She has a prior history of impaired glucose tolerance and preoperatively.  Blood sugars became elevated.  Presently, she is on Levemir 10 units at bedtime.  She wishes to transition to oral medications if possible  Wt Readings from Last 3 Encounters:  04/09/14 235 lb (106.595 kg)  04/05/14 231 lb (104.781 kg)  03/20/14 241 lb 10 oz (109.6 kg)    Past Medical History  Diagnosis Date  . Personal history of colonic polyps     adenomas 03 and 08  . Hyperlipidemia     takes Lipitor daily  . Osteopenia   . Vitamin D deficiency     takes Vit D  . Allergy     seasonal  . Arthritis   . Breast cancer     left: surgery,chemo,radiation  . Atrial fibrillation     s/p TEE DCCV and repeat DCCV 11/14/2013  . DCM (dilated cardiomyopathy)     EF 15-20% ? tachycardia induced  . Hypertension     takes Losartan and Metoprolol.   Marland Kitchen Dysrhythmia     A fib-takes AMiodarone,Digoxin,and Xarelto daily  . Chronic systolic CHF (congestive heart failure)     takes Furosemide and Aldactone daily  . COPD (chronic obstructive pulmonary disease)     no inhalers   . History of bronchitis 15+yrs ago  . Joint pain   . Chronic back pain     lumbar stenosis  . Muscle spasm     takes Flexeril daily as needed   . GERD (gastroesophageal reflux disease)     once in a while;depends on what she eats  . Nocturia   . Cataracts, bilateral     immature  . Diabetes mellitus without complication     recent dx    History   Social History  . Marital Status: Widowed    Spouse Name: N/A    Number of Children: N/A  . Years of Education: N/A   Occupational History  .  Not on file.   Social History Main Topics  . Smoking status: Former Smoker    Quit date: 08/24/2001  . Smokeless tobacco: Never Used  . Alcohol Use: No  . Drug Use: No  . Sexual Activity: Not on file   Other Topics Concern  . Not on file   Social History Narrative  . No narrative on file    Past Surgical History  Procedure Laterality Date  . Cholecystectomy  2004  . Colonoscopy  2003, September 2008, April 01, 2011    adenomas 03 and 08, polyp 12, diverticulosis  . Joint replacement      totally replacement surgery  . Tubal ligation    . Breast lumpectomy  2002  . Port-a-cath removal    . Total knee arthroplasty  2006  . Tee without cardioversion N/A 11/12/2013    Procedure: TRANSESOPHAGEAL ECHOCARDIOGRAM (TEE);  Surgeon: Thayer Headings, MD;  Location: [facility];  Service: Cardiovascular;  Laterality: N/A;  pt b/p low, pt buccal membranes very dry, lips scapped, pt c/o thirst. NPO since MN and iv FLUIDS TOTAL INFUSING AT TOTAL 20ML HR.Marland KitchenMarland KitchenMarland Kitchen  Dr. Cathie Olden order allow NS to bolus during procedure....very dry,NS bolus 250 ml total..pt responding well to meds..  . Cardioversion N/A 11/12/2013    Procedure: CARDIOVERSION;  Surgeon: Thayer Headings, MD;  Location[facility] ENDOSCOPY;  Service: Cardiovascular;  Laterality: N/A;  10:08  Dr. Marissa Nestle, anesthesia present, [place]   60mg ,  propofol 50mg , IV for elective cardioversion....Dr. Cathie Olden delievered synch 120 joules with successful cardioversion to NSR  . Cardioversion N/A 11/12/2013    Procedure: CARDIOVERSION;  Surgeon: Thayer Headings, MD;  Location: [facility];  Service: Cardiovascular;  Laterality: N/A;  . Cardioversion N/A 11/14/2013    Procedure: CARDIOVERSION (BEDSIDE);  Surgeon: Larey Dresser, MD;  Location: [facility];  Service: Cardiovascular;  Laterality: N/A;  Shanda Howells a cath placed    . Mastectomy  July 2002    left  . Cardiac catheterization  2015  . Lumbar laminectomy/decompression microdiscectomy N/A 03/19/2014    Procedure:  LUMBAR LAMINECTOMY/DECOMPRESSION MICRODISCECTOMY 4 LEVEL;  Surgeon: Kristeen Miss, MD;  Location: [facility] NEURO ORS;  Service: Neurosurgery;  Laterality: N/A;  L1-2 L2-3 L3-4 L4-5 Laminectomy/Foraminotomy    Family History  Problem Relation Age of Onset  . Dementia Sister   . Diabetes Sister   . Diabetes Brother   . Heart disease Brother   . Liver cancer Mother   . Diabetes Mother   . Cancer Mother     pancreatic  . Diabetes Father   . Heart disease Father     Allergies  Allergen Reactions  . Codeine Phosphate Other (See Comments)    Weird feeling "    Current Outpatient Prescriptions on File Prior to Visit  Medication Sig Dispense Refill  . amiodarone (PACERONE) 200 MG tablet Take 200 mg by mouth daily.       Marland Kitchen atorvastatin (LIPITOR) 20 MG tablet Take 1 tablet (20 mg total) by mouth at bedtime.  30 tablet  5  . calcium carbonate (OS-CAL) 600 MG TABS Take 1,200 mg by mouth daily with breakfast.       . Cholecalciferol (VITAMIN D) 2000 UNITS tablet Take 2,000 Units by mouth daily.       . cyclobenzaprine (FLEXERIL) 10 MG tablet Take 1 tablet (10 mg total) by mouth 3 (three) times daily as needed for muscle spasms.  30 tablet  2  . fluticasone (FLONASE) 50 MCG/ACT nasal spray Place 1 spray into both nostrils daily as needed for allergies.      . furosemide (LASIX) 20 MG tablet Take 2 tablets (40 mg total) by mouth 2 (two) times daily.  120 tablet  0  . glucose blood (ONETOUCH VERIO) test strip 1 each by Other route daily as needed for other.  100 each  12  . Hydrocodone-Acetaminophen 5-300 MG TABS Take 1 tablet by mouth daily as needed (pain).       . insulin detemir (LEVEMIR) 100 UNIT/ML injection Inject 0.1 mLs (10 Units total) into the skin at bedtime.  10 mL  11  . Insulin Pen Needle 31G X 5 MM MISC 1 each by Does not apply route daily.  100 each  3  . losartan (COZAAR) 50 MG tablet Take 25 mg by mouth 2 (two) times daily.      . meloxicam (MOBIC) 15 MG tablet Take 1 tablet (15 mg  total) by mouth daily.  30 tablet  3  . metoprolol succinate (TOPROL-XL) 25 MG 24 hr tablet Take 1 tablet (25 mg total) by mouth 2 (two) times daily.  60 tablet  5  . Multiple Vitamin (MULTIVITAMIN) tablet Take 1 tablet by mouth daily.       . potassium chloride SA (K-DUR,KLOR-CON) 20 MEQ tablet Take 1 tablet (20 mEq total) by mouth 2 (two) times daily.  60 tablet  11  . Rivaroxaban (XARELTO) 20 MG TABS tablet Take 1 tablet (20 mg total) by mouth daily with supper. Restart on Saturday am 4/11  30 tablet  5  . spironolactone (ALDACTONE) 25 MG tablet Take 1 tablet (25 mg total) by mouth daily.  30 tablet  5   No current facility-administered medications on file prior to visit.    BP 100/60  Pulse 66  Temp(Src) 97.5 F (36.4 C) (Oral)  Resp 18  Ht 5\' 6"  (1.676 m)  Wt 235 lb (106.595 kg)  BMI 37.95 kg/m2  SpO2 98%     Review of Systems  Constitutional: Negative.   HENT: Negative for congestion, dental problem, hearing loss, rhinorrhea, sinus pressure, sore throat and tinnitus.   Eyes: Negative for pain, discharge and visual disturbance.  Respiratory: Negative for cough and shortness of breath.   Cardiovascular: Negative for chest pain, palpitations and leg swelling.  Gastrointestinal: Negative for nausea, vomiting, abdominal pain, diarrhea, constipation, blood in stool and abdominal distention.  Genitourinary: Negative for dysuria, urgency, frequency, hematuria, flank pain, vaginal bleeding, vaginal discharge, difficulty urinating, vaginal pain and pelvic pain.  Musculoskeletal: Negative for arthralgias, gait problem and joint swelling.  Skin: Negative for rash.  Neurological: Negative for dizziness, syncope, speech difficulty, weakness, numbness and headaches.  Hematological: Negative for adenopathy.  Psychiatric/Behavioral: Negative for behavioral problems, dysphoric mood and agitation. The patient is not nervous/anxious.        Objective:   Physical Exam  Constitutional: She  is oriented to person, place, and time. She appears well-developed and well-nourished.  HENT:  Head: Normocephalic.  Right Ear: External ear normal.  Left Ear: External ear normal.  Mouth/Throat: Oropharynx is clear and moist.  Eyes: Conjunctivae and EOM are normal. Pupils are equal, round, and reactive to light.  Neck: Normal range of motion. Neck supple. No thyromegaly present.  Cardiovascular: Normal rate, regular rhythm, normal heart sounds and intact distal pulses.   Pulmonary/Chest: Effort normal and breath sounds normal.  Abdominal: Soft. Bowel sounds are normal. She exhibits no mass. There is no tenderness.  Musculoskeletal: Normal range of motion.  Lymphadenopathy:    She has no cervical adenopathy.  Neurological: She is alert and oriented to person, place, and time.  Skin: Skin is warm and dry. No rash noted.  Psychiatric: She has a normal mood and affect. Her behavior is normal.          Assessment & Plan:     Diabetes mellitus.  We'll start on metformin 500 twice a day and glipizide 250 daily.  Insulin therapy will be discontinued.  She will continue close home blood sugar monitoring and recheck in 6 weeks.  She will check on insurance coverage for SGLT2 drug therapy Cardiomyopathy.  Stable  diuretic therapy recently down titrated by cardiology.  Scheduled for followup electrolytes next week Spinal stenosis.  Status post successful decompressive laminectomy

## 2014-04-10 ENCOUNTER — Other Ambulatory Visit: Payer: Self-pay | Admitting: *Deleted

## 2014-04-10 MED ORDER — FUROSEMIDE 20 MG PO TABS: 40.0000 mg | ORAL_TABLET | Freq: Two times a day (BID) | ORAL | Status: DC

## 2014-04-11 ENCOUNTER — Other Ambulatory Visit: Payer: Medicare Other

## 2014-04-15 ENCOUNTER — Ambulatory Visit (INDEPENDENT_AMBULATORY_CARE_PROVIDER_SITE_OTHER): Payer: Medicare Other | Admitting: *Deleted

## 2014-04-15 DIAGNOSIS — I10 Essential (primary) hypertension: Secondary | ICD-10-CM

## 2014-04-15 LAB — BASIC METABOLIC PANEL
BUN: 24 mg/dL — ABNORMAL HIGH (ref 6–23)
CO2: 26 mEq/L (ref 19–32)
Calcium: 10 mg/dL (ref 8.4–10.5)
Chloride: 99 mEq/L (ref 96–112)
Creatinine, Ser: 1.5 mg/dL — ABNORMAL HIGH (ref 0.4–1.2)
GFR: 36.37 mL/min — AB (ref >60.00)
Glucose, Bld: 192 mg/dL — ABNORMAL HIGH (ref 70–99)
Potassium: 3.7 mEq/L (ref 3.5–5.1)
SODIUM: 136 meq/L (ref 135–145)

## 2014-04-16 ENCOUNTER — Telehealth: Payer: Self-pay | Admitting: Cardiology

## 2014-04-16 NOTE — Telephone Encounter (Signed)
Spoke with pt and gave lab results

## 2014-04-16 NOTE — Telephone Encounter (Signed)
Follow up  ° ° °Patient calling back to speak with nurse  °

## 2014-04-17 ENCOUNTER — Other Ambulatory Visit: Payer: Self-pay | Admitting: General Surgery

## 2014-04-17 DIAGNOSIS — Z79899 Other long term (current) drug therapy: Secondary | ICD-10-CM

## 2014-04-17 MED ORDER — FUROSEMIDE 20 MG PO TABS: 40.0000 mg | ORAL_TABLET | Freq: Every day | ORAL | Status: DC

## 2014-04-22 ENCOUNTER — Other Ambulatory Visit (HOSPITAL_COMMUNITY): Payer: Self-pay | Admitting: Cardiology

## 2014-04-23 LAB — HM DIABETES EYE EXAM

## 2014-04-29 ENCOUNTER — Telehealth: Payer: Self-pay | Admitting: Cardiology

## 2014-05-02 ENCOUNTER — Ambulatory Visit (INDEPENDENT_AMBULATORY_CARE_PROVIDER_SITE_OTHER): Payer: Medicare Other | Admitting: *Deleted

## 2014-05-02 ENCOUNTER — Other Ambulatory Visit (INDEPENDENT_AMBULATORY_CARE_PROVIDER_SITE_OTHER): Payer: Medicare Other

## 2014-05-02 ENCOUNTER — Encounter: Payer: Self-pay | Admitting: General Surgery

## 2014-05-02 VITALS — BP 112/62 | HR 69

## 2014-05-02 DIAGNOSIS — Z79899 Other long term (current) drug therapy: Secondary | ICD-10-CM

## 2014-05-02 DIAGNOSIS — I10 Essential (primary) hypertension: Secondary | ICD-10-CM

## 2014-05-02 LAB — BASIC METABOLIC PANEL
BUN: 21 mg/dL (ref 6–23)
CO2: 24 mEq/L (ref 19–32)
CREATININE: 1.2 mg/dL (ref 0.4–1.2)
Calcium: 10 mg/dL (ref 8.4–10.5)
Chloride: 101 mEq/L (ref 96–112)
GFR: 47.4 mL/min — ABNORMAL LOW (ref >60.00)
Glucose, Bld: 126 mg/dL — ABNORMAL HIGH (ref 70–99)
Potassium: 3.8 mEq/L (ref 3.5–5.1)
Sodium: 136 mEq/L (ref 135–145)

## 2014-05-02 NOTE — Progress Notes (Signed)
1.) Reason for visit: blood pressure check & lab work  2.) Name of MD requesting visit: Dr. Radford Pax  3.) H&P: hypertension  4.) ROS related to problem: none at this time  5.) Assessment and plan per MD:

## 2014-05-05 ENCOUNTER — Other Ambulatory Visit (HOSPITAL_COMMUNITY): Payer: Self-pay | Admitting: Cardiology

## 2014-05-06 ENCOUNTER — Telehealth: Payer: Self-pay | Admitting: Cardiology

## 2014-05-06 NOTE — Telephone Encounter (Signed)
New message     Pt has a dental appt thurs---can she stop blood thinner (xeralto) tomorrow?

## 2014-05-06 NOTE — Telephone Encounter (Signed)
Called stating she has a dental appointment on Thurs for a cleaning and wants to know if she should stop Xarelto.  Advised we do not stop Xarelto for routine cleaning but if she needs a dental procedure she will need to call us back.

## 2014-05-07 ENCOUNTER — Encounter: Payer: Self-pay | Admitting: General Surgery

## 2014-05-14 ENCOUNTER — Encounter: Payer: Self-pay | Admitting: Internal Medicine

## 2014-05-14 ENCOUNTER — Ambulatory Visit (INDEPENDENT_AMBULATORY_CARE_PROVIDER_SITE_OTHER): Payer: Medicare Other | Admitting: Internal Medicine

## 2014-05-14 VITALS — BP 110/66 | HR 70 | Temp 98.2°F | Resp 20 | Ht 66.0 in | Wt 230.0 lb

## 2014-05-14 DIAGNOSIS — Z853 Personal history of malignant neoplasm of breast: Secondary | ICD-10-CM

## 2014-05-14 DIAGNOSIS — I4819 Other persistent atrial fibrillation: Secondary | ICD-10-CM

## 2014-05-14 DIAGNOSIS — E119 Type 2 diabetes mellitus without complications: Secondary | ICD-10-CM

## 2014-05-14 DIAGNOSIS — I10 Essential (primary) hypertension: Secondary | ICD-10-CM

## 2014-05-14 DIAGNOSIS — I4891 Unspecified atrial fibrillation: Secondary | ICD-10-CM

## 2014-05-14 DIAGNOSIS — E785 Hyperlipidemia, unspecified: Secondary | ICD-10-CM

## 2014-05-14 DIAGNOSIS — E1165 Type 2 diabetes mellitus with hyperglycemia: Secondary | ICD-10-CM

## 2014-05-14 NOTE — Progress Notes (Signed)
Subjective:    Patient ID: Cynthia Herrera, female    DOB: 1946-01-20, 68 y.o.   MRN: [id number]  HPI 68 year old patient who is seen today for followup of type 2 diabetes.  She has been transitioned from insulin to oral agents and has maintained very nice glycemic control.  She has a history of coronary artery disease.  Systolic heart failure, hypertension, and atrial fibrillation.  Her cardiac status has been stable.  She is scheduled for cardiology followup next month.  Past Medical History  Diagnosis Date  . Personal history of colonic polyps     adenomas 03 and 08  . Hyperlipidemia     takes Lipitor daily  . Osteopenia   . Vitamin D deficiency     takes Vit D  . Allergy     seasonal  . Arthritis   . Breast cancer     left: surgery,chemo,radiation  . Atrial fibrillation     s/p TEE DCCV and repeat DCCV 11/14/2013  . DCM (dilated cardiomyopathy)     EF 15-20% ? tachycardia induced  . Hypertension     takes Losartan and Metoprolol.   Marland Kitchen Dysrhythmia     A fib-takes AMiodarone,Digoxin,and Xarelto daily  . Chronic systolic CHF (congestive heart failure)     takes Furosemide and Aldactone daily  . COPD (chronic obstructive pulmonary disease)     no inhalers   . History of bronchitis 15+yrs ago  . Joint pain   . Chronic back pain     lumbar stenosis  . Muscle spasm     takes Flexeril daily as needed   . GERD (gastroesophageal reflux disease)     once in a while;depends on what she eats  . Nocturia   . Cataracts, bilateral     immature  . Diabetes mellitus without complication     recent dx    History   Social History  . Marital Status: Widowed    Spouse Name: N/A    Number of Children: N/A  . Years of Education: N/A   Occupational History  . Not on file.   Social History Main Topics  . Smoking status: Former Smoker    Quit date: 08/24/2001  . Smokeless tobacco: Never Used  . Alcohol Use: No  . Drug Use: No  . Sexual Activity: Not on file   Other  Topics Concern  . Not on file   Social History Narrative  . No narrative on file    Past Surgical History  Procedure Laterality Date  . Cholecystectomy  2004  . Colonoscopy  2003, September 2008, April 01, 2011    adenomas 03 and 08, polyp 12, diverticulosis  . Joint replacement      totally replacement surgery  . Tubal ligation    . Breast lumpectomy  2002  . Port-a-cath removal    . Total knee arthroplasty  2006  . Tee without cardioversion N/A 11/12/2013    Procedure: TRANSESOPHAGEAL ECHOCARDIOGRAM (TEE);  Surgeon: Thayer Headings, MD;  Location: [place];  Service: Cardiovascular;  Laterality: N/A;  pt b/p low, pt buccal membranes very dry, lips scapped, pt c/o thirst. NPO since MN and iv FLUIDS TOTAL INFUSING AT TOTAL 20ML HR....  Dr. Cathie Olden order allow NS to bolus during procedure....very dry,NS bolus 250 ml total..pt responding well to meds..  . Cardioversion N/A 11/12/2013    Procedure: CARDIOVERSION;  Surgeon: Thayer Headings, MD;  Location: [place];  Service: Cardiovascular;  Laterality: N/A;  10:08  Dr. Marissa Nestle, anesthesia present, [place]   60mg ,  propofol 50mg , IV for elective cardioversion....Dr. Cathie Olden delievered synch 120 joules with successful cardioversion to NSR  . Cardioversion N/A 11/12/2013    Procedure: CARDIOVERSION;  Surgeon: Thayer Headings, MD;  Location: [place];  Service: Cardiovascular;  Laterality: N/A;  . Cardioversion N/A 11/14/2013    Procedure: CARDIOVERSION (BEDSIDE);  Surgeon: Larey Dresser, MD;  Location: [place];  Service: Cardiovascular;  Laterality: N/A;  Shanda Howells a cath placed    . Mastectomy  July 2002    left  . Cardiac catheterization  2015  . Lumbar laminectomy/decompression microdiscectomy N/A 03/19/2014    Procedure: LUMBAR LAMINECTOMY/DECOMPRESSION MICRODISCECTOMY 4 LEVEL;  Surgeon: Kristeen Miss, MD;  Location: [facility];  Service: Neurosurgery;  Laterality: N/A;  L1-2 L2-3 L3-4 L4-5 Laminectomy/Foraminotomy    Family History    Problem Relation Age of Onset  . Dementia Sister   . Diabetes Sister   . Diabetes Brother   . Heart disease Brother   . Liver cancer Mother   . Diabetes Mother   . Cancer Mother     pancreatic  . Diabetes Father   . Heart disease Father     Allergies  Allergen Reactions  . Codeine Phosphate Other (See Comments)    Weird feeling "    Current Outpatient Prescriptions on File Prior to Visit  Medication Sig Dispense Refill  . amiodarone (PACERONE) 200 MG tablet Take 200 mg by mouth daily.       Marland Kitchen atorvastatin (LIPITOR) 20 MG tablet TAKE 1 TABLET AT BEDTIME  30 tablet  5  . calcium carbonate (OS-CAL) 600 MG TABS Take 1,200 mg by mouth daily with breakfast.       . Cholecalciferol (VITAMIN D) 2000 UNITS tablet Take 2,000 Units by mouth daily.       . cyclobenzaprine (FLEXERIL) 10 MG tablet Take 1 tablet (10 mg total) by mouth 3 (three) times daily as needed for muscle spasms.  30 tablet  2  . fluticasone (FLONASE) 50 MCG/ACT nasal spray Place 1 spray into both nostrils daily as needed for allergies.      . furosemide (LASIX) 20 MG tablet Take 2 tablets (40 mg total) by mouth daily.      Marland Kitchen glipiZIDE (GLUCOTROL XL) 2.5 MG 24 hr tablet Take 1 tablet (2.5 mg total) by mouth daily with breakfast.  90 tablet  2  . glucose blood (ONETOUCH VERIO) test strip 1 each by Other route daily as needed for other.  100 each  12  . losartan (COZAAR) 50 MG tablet TAKE 1/2 TABLET (25MG ) BY MOUTH TWICE A DAY  30 tablet  5  . meloxicam (MOBIC) 15 MG tablet Take 1 tablet (15 mg total) by mouth daily.  30 tablet  3  . metFORMIN (GLUCOPHAGE) 500 MG tablet Take 1 tablet (500 mg total) by mouth 2 (two) times daily with a meal.  180 tablet  3  . metoprolol succinate (TOPROL-XL) 25 MG 24 hr tablet TAKE 1 TABLET TWICE A DAY  60 tablet  5  . Multiple Vitamin (MULTIVITAMIN) tablet Take 1 tablet by mouth daily.       . potassium chloride SA (K-DUR,KLOR-CON) 20 MEQ tablet Take 1 tablet (20 mEq total) by mouth 2 (two)  times daily.  60 tablet  11  . Rivaroxaban (XARELTO) 20 MG TABS tablet Take 1 tablet (20 mg total) by mouth daily with supper. Restart on Saturday am 4/11  30 tablet  5  .  spironolactone (ALDACTONE) 25 MG tablet TAKE 1 TABLET BY MOUTH EVERY DAY  30 tablet  5   No current facility-administered medications on file prior to visit.    BP 110/66  Pulse 70  Temp(Src) 98.2 F (36.8 C) (Oral)  Resp 20  Ht 5\' 6"  (1.676 m)  Wt 230 lb (104.327 kg)  BMI 37.14 kg/m2  SpO2 96%      Review of Systems  Constitutional: Negative.   HENT: Positive for postnasal drip and rhinorrhea. Negative for congestion, dental problem, hearing loss, sinus pressure, sore throat and tinnitus.   Eyes: Negative for pain, discharge and visual disturbance.  Respiratory: Negative for cough and shortness of breath.   Cardiovascular: Negative for chest pain, palpitations and leg swelling.  Gastrointestinal: Negative for nausea, vomiting, abdominal pain, diarrhea, constipation, blood in stool and abdominal distention.  Genitourinary: Negative for dysuria, urgency, frequency, hematuria, flank pain, vaginal bleeding, vaginal discharge, difficulty urinating, vaginal pain and pelvic pain.  Musculoskeletal: Negative for arthralgias, gait problem and joint swelling.  Skin: Negative for rash.  Neurological: Negative for dizziness, syncope, speech difficulty, weakness, numbness and headaches.  Hematological: Negative for adenopathy.  Psychiatric/Behavioral: Negative for behavioral problems, dysphoric mood and agitation. The patient is not nervous/anxious.        Objective:   Physical Exam  Constitutional: She is oriented to person, place, and time. She appears well-developed and well-nourished.  HENT:  Head: Normocephalic.  Right Ear: External ear normal.  Left Ear: External ear normal.  Mouth/Throat: Oropharynx is clear and moist.  Eyes: Conjunctivae and EOM are normal. Pupils are equal, round, and reactive to light.    Neck: Normal range of motion. Neck supple. No thyromegaly present.  Cardiovascular: Normal rate, regular rhythm, normal heart sounds and intact distal pulses.   Pulmonary/Chest: Effort normal and breath sounds normal.  Abdominal: Soft. Bowel sounds are normal. She exhibits no mass. There is no tenderness.  Musculoskeletal: Normal range of motion.  Lymphadenopathy:    She has no cervical adenopathy.  Neurological: She is alert and oriented to person, place, and time.  Skin: Skin is warm and dry. No rash noted.  Psychiatric: She has a normal mood and affect. Her behavior is normal.          Assessment & Plan:   Diabetes mellitus.  Blood sugars appear to be well controlled on oral agents.  We'll check a hemoglobin A1c Hypertension well controlled Dyslipidemia.  Continue atorvastatin  Cardiology followup Recheck 3 months

## 2014-05-14 NOTE — Progress Notes (Signed)
Pre visit review using our clinic review tool, if applicable. No additional management support is needed unless otherwise documented below in the visit note. 

## 2014-05-14 NOTE — Patient Instructions (Signed)
Limit your sodium (Salt) intake    It is important that you exercise regularly, at least 20 minutes 3 to 4 times per week.  If you develop chest pain or shortness of breath seek  medical attention.   Please check your hemoglobin A1c every 3 months   

## 2014-05-15 ENCOUNTER — Other Ambulatory Visit (HOSPITAL_COMMUNITY): Payer: Self-pay | Admitting: Cardiology

## 2014-05-15 LAB — HEMOGLOBIN A1C: HEMOGLOBIN A1C: 5.6 % (ref 4.6–6.5)

## 2014-05-21 ENCOUNTER — Ambulatory Visit: Payer: Medicare Other | Admitting: Internal Medicine

## 2014-05-28 ENCOUNTER — Other Ambulatory Visit: Payer: Self-pay | Admitting: Cardiology

## 2014-06-24 ENCOUNTER — Encounter: Payer: Self-pay | Admitting: Internal Medicine

## 2014-06-26 ENCOUNTER — Other Ambulatory Visit: Payer: Self-pay | Admitting: Cardiology

## 2014-06-27 NOTE — Telephone Encounter (Signed)
error 

## 2014-07-04 ENCOUNTER — Telehealth: Payer: Self-pay | Admitting: Cardiology

## 2014-07-04 NOTE — Telephone Encounter (Signed)
Please make sure patient checks with her eye surgeon

## 2014-07-04 NOTE — Telephone Encounter (Signed)
Patient is having cataract surgery on 07/09/14.  I explained to patient she would not need to hold anticoagulants for cataract procedure.  Patient shows verbal understanding.  To Dr. Radford Pax as Juluis Rainier.

## 2014-07-04 NOTE — Telephone Encounter (Signed)
Follow up      Pt thinks the nurse returned her call.  If so, please call back.

## 2014-07-04 NOTE — Telephone Encounter (Signed)
New message    Patient calling having eye surgery on 9/15. Please advise on xarleto when should she stop.

## 2014-07-04 NOTE — Telephone Encounter (Signed)
To [employer] to advise.

## 2014-07-29 ENCOUNTER — Other Ambulatory Visit: Payer: Self-pay | Admitting: Cardiology

## 2014-08-07 ENCOUNTER — Encounter: Payer: Self-pay | Admitting: Internal Medicine

## 2014-08-20 ENCOUNTER — Encounter: Payer: Self-pay | Admitting: Cardiology

## 2014-08-20 ENCOUNTER — Ambulatory Visit (INDEPENDENT_AMBULATORY_CARE_PROVIDER_SITE_OTHER): Payer: Medicare Other | Admitting: Cardiology

## 2014-08-20 VITALS — BP 120/70 | HR 58 | Ht 66.0 in | Wt 228.0 lb

## 2014-08-20 DIAGNOSIS — I2510 Atherosclerotic heart disease of native coronary artery without angina pectoris: Secondary | ICD-10-CM

## 2014-08-20 DIAGNOSIS — Z23 Encounter for immunization: Secondary | ICD-10-CM

## 2014-08-20 DIAGNOSIS — I420 Dilated cardiomyopathy: Secondary | ICD-10-CM

## 2014-08-20 DIAGNOSIS — I480 Paroxysmal atrial fibrillation: Secondary | ICD-10-CM

## 2014-08-20 DIAGNOSIS — I10 Essential (primary) hypertension: Secondary | ICD-10-CM

## 2014-08-20 NOTE — Progress Notes (Signed)
[address], [place], [place]  [address] Phone: [phone] Fax:  [phone]  Date:  08/20/2014   ID:  Makinlee, Awwad 03-Feb-1946, MRN [id number]  PCP:  Nyoka Cowden, MD  Cardiologist:  [name], MD    History of Present Illness: Cynthia Herrera is a 68 y.o. 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 She was also in atrial fibrillation with RVR. She was diuresed and ruled out for MI. Echo showed EF 15-20%. She was started on Amiodarone and on 11/12/2013 underwent TEE/DCCV to NSR but failed to hold NSR. She was again cardioverted on 1/21 after more loading with amio but failed to convert. Later that day she spontaneously converted to NSR. Her LV dysfunction persisted after maintenance of NSR so she underwent cardiac cath which showed calcified coronary arteries with no obstructive lesions and mildly reduced LVF at 45% and normal right heart pressures. She now presents back for followup. She is doing well. She denies any chest pain, SOB, DOE, LE edema, palpitations or syncope.  She occasionally gets lightheaded when going from sitting to standing.  When I saw her last she was having a lot of fatigue and was bradycardic and I stopped her dig.  That did not really help her symptoms and she is still having exertional fatigue.    Wt Readings from Last 3 Encounters:  08/20/14 228 lb (103.42 kg)  05/14/14 230 lb (104.327 kg)  04/09/14 235 lb (106.595 kg)     Past Medical History  Diagnosis Date  . Personal history of colonic polyps     adenomas 03 and 08  . Hyperlipidemia     takes Lipitor daily  . Osteopenia   . Vitamin D deficiency     takes Vit D  . Allergy     seasonal  . Arthritis   . Breast cancer     left: surgery,chemo,radiation  . Atrial fibrillation     s/p TEE DCCV and repeat DCCV 11/14/2013  . DCM  (dilated cardiomyopathy)     EF 15-20% ? tachycardia induced  . Hypertension     takes Losartan and Metoprolol.   Marland Kitchen Dysrhythmia     A fib-takes AMiodarone,Digoxin,and Xarelto daily  . Chronic systolic CHF (congestive heart failure)     takes Furosemide and Aldactone daily  . COPD (chronic obstructive pulmonary disease)     no inhalers   . History of bronchitis 15+yrs ago  . Joint pain   . Chronic back pain     lumbar stenosis  . Muscle spasm     takes Flexeril daily as needed   . GERD (gastroesophageal reflux disease)     once in a while;depends on what she eats  . Nocturia   . Cataracts, bilateral     immature  . Diabetes mellitus without complication     recent dx    Current Outpatient Prescriptions  Medication Sig Dispense Refill  . amiodarone (PACERONE) 200 MG tablet Take 200 mg by mouth daily.       Marland Kitchen atorvastatin (LIPITOR) 20 MG tablet TAKE 1 TABLET AT BEDTIME  30 tablet  5  . calcium carbonate (OS-CAL) 600 MG TABS Take 1,200 mg by mouth daily with breakfast.       . Cholecalciferol (VITAMIN D) 2000 UNITS tablet Take 2,000 Units by mouth daily.       Marland Kitchen  cyclobenzaprine (FLEXERIL) 10 MG tablet Take 1 tablet (10 mg total) by mouth 3 (three) times daily as needed for muscle spasms.  30 tablet  2  . fluticasone (FLONASE) 50 MCG/ACT nasal spray Place 1 spray into both nostrils daily as needed for allergies.      . furosemide (LASIX) 20 MG tablet TAKE 2 TABLETS BY MOUTH ONCE DAILY *PT NEEDS APPT**  60 tablet  0  . glipiZIDE (GLUCOTROL XL) 2.5 MG 24 hr tablet Take 1 tablet (2.5 mg total) by mouth daily with breakfast.  90 tablet  2  . glucose blood (ONETOUCH VERIO) test strip 1 each by Other route daily as needed for other.  100 each  12  . losartan (COZAAR) 50 MG tablet TAKE 1/2 TABLET (25MG ) BY MOUTH TWICE A DAY  30 tablet  5  . meloxicam (MOBIC) 15 MG tablet Take 1 tablet (15 mg total) by mouth daily.  30 tablet  3  . metFORMIN (GLUCOPHAGE) 500 MG tablet Take 1 tablet (500 mg  total) by mouth 2 (two) times daily with a meal.  180 tablet  3  . metoprolol succinate (TOPROL-XL) 25 MG 24 hr tablet TAKE 1 TABLET TWICE A DAY  60 tablet  5  . Multiple Vitamin (MULTIVITAMIN) tablet Take 1 tablet by mouth daily.       . potassium chloride SA (K-DUR,KLOR-CON) 20 MEQ tablet Take 1 tablet (20 mEq total) by mouth 2 (two) times daily.  60 tablet  11  . Rivaroxaban (XARELTO) 20 MG TABS tablet Take 1 tablet (20 mg total) by mouth daily with supper. Restart on Saturday am 4/11  30 tablet  5  . spironolactone (ALDACTONE) 25 MG tablet TAKE 1 TABLET BY MOUTH EVERY DAY  30 tablet  5   No current facility-administered medications for this visit.    Allergies:    Allergies  Allergen Reactions  . Codeine Phosphate Other (See Comments)    Weird feeling "    Social History:  The patient  reports that she quit smoking about 12 years ago. She has never used smokeless tobacco. She reports that she does not drink alcohol or use illicit drugs.   Family History:  The patient's family history includes Cancer in her mother; Dementia in her sister; Diabetes in her brother, father, mother, and sister; Heart disease in her brother and father; Liver cancer in her mother.   ROS:  Please see the history of present illness.      All other systems reviewed and negative.   PHYSICAL EXAM: VS:  BP 120/70  Pulse 58  Ht 5\' 6"  (1.676 m)  Wt 228 lb (103.42 kg)  BMI 36.82 kg/m2 Well nourished, well developed, in no acute distress HEENT: normal Neck: no JVD Cardiac:  normal S1, S2; RRR; no murmur Lungs:  clear to auscultation bilaterally, no wheezing, rhonchi or rales Abd: soft, nontender, no hepatomegaly Ext: no edema Skin: warm and dry Neuro:  CNs 2-12 intact, no focal abnormalities noted  ASSESSMENT AND PLAN:  1. Nonobstructive ASCAD 2. Mild LV dysfunction EF 45% by cath - presumed tachycardia induced from prior afib with RVR - EF has improved after maintenance of NSR.  3. Chronic systolic CHF  well compensated - continue ARB/lasix and Aldactone - stopping BB due to bradycardia and fatigue 4. PAF maintaining NSR post DCCV - continue Xarelto/Amio  5. HTN well controlled - continue Losartan/Aldactone        6.  Fatigue that has not resolved after stopping dig.  She is still bradycardic and I suspect her metoprolol may be causing some of her symptoms.  I have asked her to cut back to 1/2 tablet of Toprol daily fo r3 days, then every other day for 4 days and then stop.  She will call me in 2 weeks and let me know how she is feeling.  Check 2D echo to reassess LVF.  She requests a flu shot today.  Followup with me in 3  months      Signed, [name], MD [facility] 08/20/2014 4:35 PM

## 2014-08-20 NOTE — Patient Instructions (Addendum)
Your physician has recommended you make the following change in your medication:  1) DECREASE your TOPROL to 12.5 mg (HALF TABLET) every day for three days (October 28, 29, 30) 2) AFTER three days, take 12.5 mg EVERY OTHER DAY for 4 days (November 1 and 3) 3) THEN STOP TOPROL  Call in two weeks to F/U.  Your physician has requested that you have an echocardiogram. Echocardiography is a painless test that uses sound waves to create images of your heart. It provides your doctor with information about the size and shape of your heart and how well your heart's chambers and valves are working. This procedure takes approximately one hour. There are no restrictions for this procedure.  Your physician recommends that you schedule a follow-up appointment in: 3 months with Dr. Radford Pax.

## 2014-08-21 ENCOUNTER — Telehealth: Payer: Self-pay | Admitting: Cardiology

## 2014-08-21 NOTE — Telephone Encounter (Signed)
New message    patient calling in reference to medication that was change on yesterday.

## 2014-08-21 NOTE — Telephone Encounter (Signed)
Patient called to say she took one whole tab of Lopressor this morning (25 mg). Per office note yesterday, patient instructions were to: 1) DECREASE your TOPROL to 12.5 mg (HALF TABLET) every day for three days (October 28, 29, 30)  2) AFTER three days, take 12.5 mg EVERY OTHER DAY for 4 days (November 1 and 3)  3) THEN STOP TOPROL  Instructed patient to start regimen to discontinue the medication tomorrow, and to push the dates back one day.  Patient agrees with treatment plan.

## 2014-08-25 ENCOUNTER — Other Ambulatory Visit: Payer: Self-pay | Admitting: Cardiology

## 2014-08-26 ENCOUNTER — Ambulatory Visit (HOSPITAL_COMMUNITY): Payer: Medicare Other | Attending: Cardiovascular Disease

## 2014-08-26 DIAGNOSIS — I517 Cardiomegaly: Secondary | ICD-10-CM | POA: Insufficient documentation

## 2014-08-26 DIAGNOSIS — I420 Dilated cardiomyopathy: Secondary | ICD-10-CM

## 2014-08-26 DIAGNOSIS — Z87891 Personal history of nicotine dependence: Secondary | ICD-10-CM | POA: Diagnosis not present

## 2014-08-26 DIAGNOSIS — E119 Type 2 diabetes mellitus without complications: Secondary | ICD-10-CM | POA: Diagnosis not present

## 2014-08-26 DIAGNOSIS — I10 Essential (primary) hypertension: Secondary | ICD-10-CM | POA: Insufficient documentation

## 2014-08-26 DIAGNOSIS — E785 Hyperlipidemia, unspecified: Secondary | ICD-10-CM | POA: Diagnosis not present

## 2014-08-26 DIAGNOSIS — J810 Acute pulmonary edema: Secondary | ICD-10-CM

## 2014-08-26 NOTE — Progress Notes (Signed)
2D Echo completed. 08/26/2014  

## 2014-09-25 ENCOUNTER — Telehealth: Payer: Self-pay | Admitting: Cardiology

## 2014-09-25 NOTE — Telephone Encounter (Signed)
EF = 50-55% per last ECHO results.   Lauren unavailable, left message to call back.

## 2014-09-25 NOTE — Telephone Encounter (Signed)
New message     What was the patient's last ejection fraction from her echo?

## 2014-09-25 NOTE — Telephone Encounter (Signed)
Informed Lauren of last ECHO results.

## 2014-10-03 ENCOUNTER — Encounter (HOSPITAL_COMMUNITY): Payer: Self-pay | Admitting: Cardiology

## 2014-10-14 ENCOUNTER — Other Ambulatory Visit (INDEPENDENT_AMBULATORY_CARE_PROVIDER_SITE_OTHER): Payer: Medicare Other

## 2014-10-14 DIAGNOSIS — Z0000 Encounter for general adult medical examination without abnormal findings: Secondary | ICD-10-CM

## 2014-10-14 DIAGNOSIS — Z79899 Other long term (current) drug therapy: Secondary | ICD-10-CM

## 2014-10-14 DIAGNOSIS — I10 Essential (primary) hypertension: Secondary | ICD-10-CM | POA: Diagnosis not present

## 2014-10-14 DIAGNOSIS — E785 Hyperlipidemia, unspecified: Secondary | ICD-10-CM | POA: Diagnosis not present

## 2014-10-14 LAB — CBC WITH DIFFERENTIAL/PLATELET
BASOS ABS: 0 10*3/uL (ref 0.0–0.1)
Basophils Relative: 0.7 % (ref 0.0–3.0)
Eosinophils Absolute: 0.2 10*3/uL (ref 0.0–0.7)
Eosinophils Relative: 4.3 % (ref 0.0–5.0)
HEMATOCRIT: 36.4 % (ref 36.0–46.0)
Hemoglobin: 11.9 g/dL — ABNORMAL LOW (ref 12.0–15.0)
LYMPHS ABS: 1.1 10*3/uL (ref 0.7–4.0)
LYMPHS PCT: 21.2 % (ref 12.0–46.0)
MCHC: 32.6 g/dL (ref 30.0–36.0)
MCV: 86.3 fl (ref 78.0–100.0)
MONO ABS: 0.7 10*3/uL (ref 0.1–1.0)
Monocytes Relative: 13.5 % — ABNORMAL HIGH (ref 3.0–12.0)
Neutro Abs: 3.1 10*3/uL (ref 1.4–7.7)
Neutrophils Relative %: 60.3 % (ref 43.0–77.0)
Platelets: 327 10*3/uL (ref 150.0–400.0)
RBC: 4.22 Mil/uL (ref 3.87–5.11)
RDW: 14.6 % (ref 11.5–15.5)
WBC: 5.1 10*3/uL (ref 4.0–10.5)

## 2014-10-14 LAB — HEPATIC FUNCTION PANEL
ALBUMIN: 4.1 g/dL (ref 3.5–5.2)
ALT: 20 U/L (ref 0–35)
AST: 21 U/L (ref 0–37)
Alkaline Phosphatase: 95 U/L (ref 39–117)
Bilirubin, Direct: 0.1 mg/dL (ref 0.0–0.3)
Total Bilirubin: 0.9 mg/dL (ref 0.2–1.2)
Total Protein: 7.2 g/dL (ref 6.0–8.3)

## 2014-10-14 LAB — BASIC METABOLIC PANEL
BUN: 31 mg/dL — ABNORMAL HIGH (ref 6–23)
CALCIUM: 9.5 mg/dL (ref 8.4–10.5)
CO2: 24 mEq/L (ref 19–32)
Chloride: 102 mEq/L (ref 96–112)
Creatinine, Ser: 1.2 mg/dL (ref 0.4–1.2)
GFR: 47.8 mL/min — ABNORMAL LOW (ref >60.00)
Glucose, Bld: 92 mg/dL (ref 70–99)
Potassium: 4.5 mEq/L (ref 3.5–5.1)
SODIUM: 135 meq/L (ref 135–145)

## 2014-10-14 LAB — POCT URINALYSIS DIPSTICK
BILIRUBIN UA: NEGATIVE
GLUCOSE UA: NEGATIVE
KETONES UA: NEGATIVE
Leukocytes, UA: NEGATIVE
NITRITE UA: NEGATIVE
Spec Grav, UA: 1.02
Urobilinogen, UA: 0.2
pH, UA: 5.5

## 2014-10-14 LAB — MICROALBUMIN / CREATININE URINE RATIO
Creatinine,U: 143.7 mg/dL
MICROALB/CREAT RATIO: 0.3 mg/g (ref 0.0–30.0)
Microalb, Ur: 0.5 mg/dL (ref 0.0–1.9)

## 2014-10-14 LAB — LIPID PANEL
CHOLESTEROL: 155 mg/dL (ref 0–200)
HDL: 32.2 mg/dL — AB (ref >39.00)
LDL Cholesterol: 90 mg/dL (ref 0–99)
NONHDL: 122.8
Total CHOL/HDL Ratio: 5
Triglycerides: 165 mg/dL — ABNORMAL HIGH (ref 0.0–149.0)
VLDL: 33 mg/dL (ref 0.0–40.0)

## 2014-10-14 LAB — HEMOGLOBIN A1C: HEMOGLOBIN A1C: 5.1 % (ref 4.6–6.5)

## 2014-10-15 LAB — TSH: TSH: 0.58 u[IU]/mL (ref 0.35–4.50)

## 2014-10-24 ENCOUNTER — Ambulatory Visit (INDEPENDENT_AMBULATORY_CARE_PROVIDER_SITE_OTHER): Payer: Medicare Other | Admitting: Internal Medicine

## 2014-10-24 ENCOUNTER — Encounter: Payer: Medicare Other | Admitting: Internal Medicine

## 2014-10-24 ENCOUNTER — Encounter: Payer: Self-pay | Admitting: Internal Medicine

## 2014-10-24 VITALS — BP 120/84 | HR 85 | Temp 97.6°F | Ht 65.5 in | Wt 233.0 lb

## 2014-10-24 DIAGNOSIS — I5022 Chronic systolic (congestive) heart failure: Secondary | ICD-10-CM

## 2014-10-24 DIAGNOSIS — I480 Paroxysmal atrial fibrillation: Secondary | ICD-10-CM

## 2014-10-24 DIAGNOSIS — Z0000 Encounter for general adult medical examination without abnormal findings: Secondary | ICD-10-CM

## 2014-10-24 DIAGNOSIS — I420 Dilated cardiomyopathy: Secondary | ICD-10-CM

## 2014-10-24 DIAGNOSIS — I2510 Atherosclerotic heart disease of native coronary artery without angina pectoris: Secondary | ICD-10-CM

## 2014-10-24 DIAGNOSIS — E119 Type 2 diabetes mellitus without complications: Secondary | ICD-10-CM

## 2014-10-24 DIAGNOSIS — I4891 Unspecified atrial fibrillation: Secondary | ICD-10-CM

## 2014-10-24 NOTE — Patient Instructions (Signed)
Limit your sodium (Salt) intake   Please check your hemoglobin A1c every 3 months    It is important that you exercise regularly, at least 20 minutes 3 to 4 times per week.  If you develop chest pain or shortness of breath seek  medical attention.  You need to lose weight.  Consider a lower calorie diet and regular exercise.Health Maintenance Adopting a healthy lifestyle and getting preventive care can go a long way to promote health and wellness. Talk with your health care provider about what schedule of regular examinations is right for you. This is a good chance for you to check in with your provider about disease prevention and staying healthy. In between checkups, there are plenty of things you can do on your own. Experts have done a lot of research about which lifestyle changes and preventive measures are most likely to keep you healthy. Ask your health care provider for more information. WEIGHT AND DIET  Eat a healthy diet  Be sure to include plenty of vegetables, fruits, low-fat dairy products, and lean protein.  Do not eat a lot of foods high in solid fats, added sugars, or salt.  Get regular exercise. This is one of the most important things you can do for your health.  Most adults should exercise for at least 150 minutes each week. The exercise should increase your heart rate and make you sweat (moderate-intensity exercise).  Most adults should also do strengthening exercises at least twice a week. This is in addition to the moderate-intensity exercise.  Maintain a healthy weight  Body mass index (BMI) is a measurement that can be used to identify possible weight problems. It estimates body fat based on height and weight. Your health care provider can help determine your BMI and help you achieve or maintain a healthy weight.  For females 29 years of age and older:   A BMI below 18.5 is considered underweight.  A BMI of 18.5 to 24.9 is normal.  A BMI of 25 to 29.9 is  considered overweight.  A BMI of 30 and above is considered obese.  Watch levels of cholesterol and blood lipids  You should start having your blood tested for lipids and cholesterol at 69 years of age, then have this test every 5 years.  You may need to have your cholesterol levels checked more often if:  Your lipid or cholesterol levels are high.  You are older than 69 years of age.  You are at high risk for heart disease.  CANCER SCREENING   Lung Cancer  Lung cancer screening is recommended for adults 7-54 years old who are at high risk for lung cancer because of a history of smoking.  A yearly low-dose CT scan of the lungs is recommended for people who:  Currently smoke.  Have quit within the past 15 years.  Have at least a 30-pack-year history of smoking. A pack year is smoking an average of one pack of cigarettes a day for 1 year.  Yearly screening should continue until it has been 15 years since you quit.  Yearly screening should stop if you develop a health problem that would prevent you from having lung cancer treatment.  Breast Cancer  Practice breast self-awareness. This means understanding how your breasts normally appear and feel.  It also means doing regular breast self-exams. Let your health care provider know about any changes, no matter how small.  If you are in your 20s or 30s, you should have a  clinical breast exam (CBE) by a health care provider every 1-3 years as part of a regular health exam.  If you are 35 or older, have a CBE every year. Also consider having a breast X-ray (mammogram) every year.  If you have a family history of breast cancer, talk to your health care provider about genetic screening.  If you are at high risk for breast cancer, talk to your health care provider about having an MRI and a mammogram every year.  Breast cancer gene (BRCA) assessment is recommended for women who have family members with BRCA-related cancers.  BRCA-related cancers include:  Breast.  Ovarian.  Tubal.  Peritoneal cancers.  Results of the assessment will determine the need for genetic counseling and BRCA1 and BRCA2 testing. Cervical Cancer Routine pelvic examinations to screen for cervical cancer are no longer recommended for nonpregnant women who are considered low risk for cancer of the pelvic organs (ovaries, uterus, and vagina) and who do not have symptoms. A pelvic examination may be necessary if you have symptoms including those associated with pelvic infections. Ask your health care provider if a screening pelvic exam is right for you.   The Pap test is the screening test for cervical cancer for women who are considered at risk.  If you had a hysterectomy for a problem that was not cancer or a condition that could lead to cancer, then you no longer need Pap tests.  If you are older than 65 years, and you have had normal Pap tests for the past 10 years, you no longer need to have Pap tests.  If you have had past treatment for cervical cancer or a condition that could lead to cancer, you need Pap tests and screening for cancer for at least 20 years after your treatment.  If you no longer get a Pap test, assess your risk factors if they change (such as having a new sexual partner). This can affect whether you should start being screened again.  Some women have medical problems that increase their chance of getting cervical cancer. If this is the case for you, your health care provider may recommend more frequent screening and Pap tests.  The human papillomavirus (HPV) test is another test that may be used for cervical cancer screening. The HPV test looks for the virus that can cause cell changes in the cervix. The cells collected during the Pap test can be tested for HPV.  The HPV test can be used to screen women 47 years of age and older. Getting tested for HPV can extend the interval between normal Pap tests from three to  five years.  An HPV test also should be used to screen women of any age who have unclear Pap test results.  After 69 years of age, women should have HPV testing as often as Pap tests.  Colorectal Cancer  This type of cancer can be detected and often prevented.  Routine colorectal cancer screening usually begins at 69 years of age and continues through 69 years of age.  Your health care provider may recommend screening at an earlier age if you have risk factors for colon cancer.  Your health care provider may also recommend using home test kits to check for hidden blood in the stool.  A small camera at the end of a tube can be used to examine your colon directly (sigmoidoscopy or colonoscopy). This is done to check for the earliest forms of colorectal cancer.  Routine screening usually begins at  age 5.  Direct examination of the colon should be repeated every 5-10 years through 69 years of age. However, you may need to be screened more often if early forms of precancerous polyps or small growths are found. Skin Cancer  Check your skin from head to toe regularly.  Tell your health care provider about any new moles or changes in moles, especially if there is a change in a mole's shape or color.  Also tell your health care provider if you have a mole that is larger than the size of a pencil eraser.  Always use sunscreen. Apply sunscreen liberally and repeatedly throughout the day.  Protect yourself by wearing long sleeves, pants, a wide-brimmed hat, and sunglasses whenever you are outside. HEART DISEASE, DIABETES, AND HIGH BLOOD PRESSURE   Have your blood pressure checked at least every 1-2 years. High blood pressure causes heart disease and increases the risk of stroke.  If you are between 70 years and 61 years old, ask your health care provider if you should take aspirin to prevent strokes.  Have regular diabetes screenings. This involves taking a blood sample to check your  fasting blood sugar level.  If you are at a normal weight and have a low risk for diabetes, have this test once every three years after 69 years of age.  If you are overweight and have a high risk for diabetes, consider being tested at a younger age or more often. PREVENTING INFECTION  Hepatitis B  If you have a higher risk for hepatitis B, you should be screened for this virus. You are considered at high risk for hepatitis B if:  You were born in a country where hepatitis B is common. Ask your health care provider which countries are considered high risk.  Your parents were born in a high-risk country, and you have not been immunized against hepatitis B (hepatitis B vaccine).  You have HIV or AIDS.  You use needles to inject street drugs.  You live with someone who has hepatitis B.  You have had sex with someone who has hepatitis B.  You get hemodialysis treatment.  You take certain medicines for conditions, including cancer, organ transplantation, and autoimmune conditions. Hepatitis C  Blood testing is recommended for:  Everyone born from [birth year] through 1966.  Anyone with known risk factors for hepatitis C. Sexually transmitted infections (STIs)  You should be screened for sexually transmitted infections (STIs) including gonorrhea and chlamydia if:  You are sexually active and are younger than 69 years of age.  You are older than 69 years of age and your health care provider tells you that you are at risk for this type of infection.  Your sexual activity has changed since you were last screened and you are at an increased risk for chlamydia or gonorrhea. Ask your health care provider if you are at risk.  If you do not have HIV, but are at risk, it may be recommended that you take a prescription medicine daily to prevent HIV infection. This is called pre-exposure prophylaxis (PrEP). You are considered at risk if:  You are sexually active and do not regularly use condoms or  know the HIV status of your partner(s).  You take drugs by injection.  You are sexually active with a partner who has HIV. Talk with your health care provider about whether you are at high risk of being infected with HIV. If you choose to begin PrEP, you should first be tested for HIV. You  should then be tested every 3 months for as long as you are taking PrEP.  PREGNANCY   If you are premenopausal and you may become pregnant, ask your health care provider about preconception counseling.  If you may become pregnant, take 400 to 800 micrograms (mcg) of folic acid every day.  If you want to prevent pregnancy, talk to your health care provider about birth control (contraception). OSTEOPOROSIS AND MENOPAUSE   Osteoporosis is a disease in which the bones lose minerals and strength with aging. This can result in serious bone fractures. Your risk for osteoporosis can be identified using a bone density scan.  If you are 73 years of age or older, or if you are at risk for osteoporosis and fractures, ask your health care provider if you should be screened.  Ask your health care provider whether you should take a calcium or vitamin D supplement to lower your risk for osteoporosis.  Menopause may have certain physical symptoms and risks.  Hormone replacement therapy may reduce some of these symptoms and risks. Talk to your health care provider about whether hormone replacement therapy is right for you.  HOME CARE INSTRUCTIONS   Schedule regular health, dental, and eye exams.  Stay current with your immunizations.   Do not use any tobacco products including cigarettes, chewing tobacco, or electronic cigarettes.  If you are pregnant, do not drink alcohol.  If you are breastfeeding, limit how much and how often you drink alcohol.  Limit alcohol intake to no more than 1 drink per day for nonpregnant women. One drink equals 12 ounces of beer, 5 ounces of wine, or 1 ounces of hard liquor.  Do  not use street drugs.  Do not share needles.  Ask your health care provider for help if you need support or information about quitting drugs.  Tell your health care provider if you often feel depressed.  Tell your health care provider if you have ever been abused or do not feel safe at home. Document Released: 04/26/2011 Document Revised: 02/25/2014 Document Reviewed: 09/12/2013 [facility] Patient Information 2015 [place], [place]. This information is not intended to replace advice given to you by your health care provider. Make sure you discuss any questions you have with your health care provider.

## 2014-10-24 NOTE — Progress Notes (Signed)
Patient ID: Cynthia Herrera, female   DOB: [birth year], 68 y.o.   MRN: [id number]  Subjective:    Patient ID: Cynthia Herrera, female    DOB: [birth year], 68 y.o.   MRN: [id number]  Hypertension Pertinent negatives include no chest pain, headaches, palpitations or shortness of breath.  Hyperlipidemia Pertinent negatives include no chest pain or shortness of breath.  68  -year-old patient who is seen today for a preventive health examination.   She has a history of hypertension and dyslipidemia which has been stable. She has been followed by Dr. Ubaldo Glassing and does have a history of left breast cancer treated 13  years ago. She has a history also of colonic polyps and had a followup colonoscopy. She receives annual mammograms and gynecologic evaluations. She has a history of mild COPD and osteopenia. She no longer smokes. She did have a stress test performed approximately 5 years ago.  She is followed by cardiology with a   history of ASCAD (coronary artery calcifications on CT scan), Dilated CM EF 20%, HTN, dyslipidemia, chronic anticoagulation who was admitted with acute CHF. She was also in atrial fibrillation with RVR. She was diuresed and ruled out for MI. Echo showed EF 15-20%. She was started on Amiodarone and on 11/12/2013 underwent TEE/DCCV to NSR but failed to hold NSR. She was again cardioverted on 1/21 after more loading with amio but failed to convert. Later that day she spontaneously converted to NSR. Her LV dysfunction persisted after maintenance of NSR so she underwent cardiac cath which showed calcified coronary arteries with no obstructive lesions and mildly reduced LVF at 45% and normal right heart pressures.   Past Medical History  Diagnosis Date  . Personal history of colonic polyps     adenomas 03 and 08  . Hyperlipidemia     takes Lipitor daily  . Osteopenia   . Vitamin D deficiency     takes Vit D  . Allergy     seasonal  . Arthritis   . Breast cancer     left:  surgery,chemo,radiation  . Atrial fibrillation     s/p TEE DCCV and repeat DCCV 11/14/2013  . DCM (dilated cardiomyopathy)     EF 15-20% ? tachycardia induced  . Hypertension     takes Losartan and Metoprolol.   Marland Kitchen Dysrhythmia     A fib-takes AMiodarone,Digoxin,and Xarelto daily  . Chronic systolic CHF (congestive heart failure)     takes Furosemide and Aldactone daily  . COPD (chronic obstructive pulmonary disease)     no inhalers   . History of bronchitis 15+yrs ago  . Joint pain   . Chronic back pain     lumbar stenosis  . Muscle spasm     takes Flexeril daily as needed   . GERD (gastroesophageal reflux disease)     once in a while;depends on what she eats  . Nocturia   . Cataracts, bilateral     immature  . Diabetes mellitus without complication     recent dx   Past Surgical History  Procedure Laterality Date  . Cholecystectomy  2004  . Colonoscopy  2003, September 2008, April 01, 2011    adenomas 03 and 08, polyp 12, diverticulosis  . Joint replacement      totally replacement surgery  . Tubal ligation    . Breast lumpectomy  2002  . Port-a-cath removal    . Total knee arthroplasty  2006  . Tee without cardioversion N/A 11/12/2013  Procedure: TRANSESOPHAGEAL ECHOCARDIOGRAM (TEE);  Surgeon: Thayer Headings, MD;  Location: [facility];  Service: Cardiovascular;  Laterality: N/A;  pt b/p low, pt buccal membranes very dry, lips scapped, pt c/o thirst. NPO since MN and iv FLUIDS TOTAL INFUSING AT TOTAL 20ML HR....  Dr. Cathie Olden order allow NS to bolus during procedure....very dry,NS bolus 250 ml total..pt responding well to meds..  . Cardioversion N/A 11/12/2013    Procedure: CARDIOVERSION;  Surgeon: Thayer Headings, MD;  Location[facility];  Service: Cardiovascular;  Laterality: N/A;  10:08  Dr. Marissa Nestle, anesthesia present, Lido   60mg ,  propofol 50mg , IV for elective cardioversion....Dr. Cathie Olden delievered synch 120 joules with successful cardioversion to NSR  .  Cardioversion N/A 11/12/2013    Procedure: CARDIOVERSION;  Surgeon: Thayer Headings, MD;  Location: [facility];  Service: Cardiovascular;  Laterality: N/A;  . Cardioversion N/A 11/14/2013    Procedure: CARDIOVERSION (BEDSIDE);  Surgeon: Larey Dresser, MD;  Location: [facility];  Service: Cardiovascular;  Laterality: N/A;  Shanda Howells a cath placed    . Mastectomy  July 2002    left  . Cardiac catheterization  2015  . Lumbar laminectomy/decompression microdiscectomy N/A 03/19/2014    Procedure: LUMBAR LAMINECTOMY/DECOMPRESSION MICRODISCECTOMY 4 LEVEL;  Surgeon: Kristeen Miss, MD;  Location: [facility];  Service: Neurosurgery;  Laterality: N/A;  L1-2 L2-3 L3-4 L4-5 Laminectomy/Foraminotomy  . Left and right heart catheterization with coronary angiogram N/A 02/01/2014    Procedure: LEFT AND RIGHT HEART CATHETERIZATION WITH CORONARY ANGIOGRAM;  Surgeon: Sueanne Margarita, MD;  Location: [facility] CATH LAB;  Service: Cardiovascular;  Laterality: N/A;    reports that she quit smoking about 13 years ago. She has never used smokeless tobacco. She reports that she does not drink alcohol or use illicit drugs. family history includes Cancer in her mother; Dementia in her sister; Diabetes in her brother, father, mother, and sister; Heart disease in her brother and father; Liver cancer in her mother. Allergies  Allergen Reactions  . Codeine Phosphate Other (See Comments)    Weird feeling "   Family History:  Reviewed history from 05/29/2009 and no changes required.  Family History Diabetes 1st degree relative  Fam hx MI  Family History Other cancer-Father died  at 62 of an MI; mother deceased from pancreatic cancer. Two brothers; one deceased. One status post CABG; one sister.- DM and dementia; History DVT. Both parents. Both brothers and one sister with diabetes;   Social History:  Reviewed history from 05/24/2008 and no changes required.  Former Smoker  Married  presently unemployed  [employer]  1. Risk  factors, based on past  M,S,F history-  cardiovascular risk factors include hypertension and dyslipidemia.  She has a recent diagnosis of diabetes mellitus type 2, which has been well controlled.  She has nonobstructive coronary artery disease with mildly impaired systolic function  2.  Physical activities: No exercise limitations does have arthritis and exogenous obesity; history of DOE  3.  Depression/mood: No history of depression or mood disorder  4.  Hearing: No deficits  5.  ADL's: Independent in all aspects of daily living  6.  Fall risk: Low  7.  Home safety: No problems identified  8.  Height weight, and visual acuity; height and weight stable no change in visual acuity does have annual eye examinations  9.  Counseling: Weight loss exercise vitamin D supplementation all encouraged  10. Lab orders based on risk factors: Laboratory profile reviewed lipid panel well controlled  11. Referral: Followup ophthalmology GI and GYN  12. Care plan: Weight loss exercise low salt diet all encouraged  13. Cognitive assessment: Alert and oriented with normal affect no cognitive dysfunction  14.  Preventive services will include annual health assessments.  She'll require quarterly monitoring of hemoglobin A1c's. V Patient was provided with a written and personalized care plan  15.  Provider list updated.  Includes cardiology GYN primary care medicine        Review of Systems  Constitutional: Negative for fever, appetite change, fatigue and unexpected weight change.  HENT: Positive for congestion. Negative for dental problem, ear pain, hearing loss, mouth sores, nosebleeds, sinus pressure, sore throat, tinnitus, trouble swallowing and voice change.   Eyes: Negative for photophobia, pain, redness and visual disturbance.  Respiratory: Negative for cough, chest tightness and shortness of breath.   Cardiovascular: Negative for chest pain, palpitations and leg swelling.   Gastrointestinal: Negative for nausea, vomiting, abdominal pain, diarrhea, constipation, blood in stool, abdominal distention and rectal pain.  Genitourinary: Negative for dysuria, urgency, frequency, hematuria, flank pain, vaginal bleeding, vaginal discharge, difficulty urinating, genital sores, vaginal pain, menstrual problem and pelvic pain.  Musculoskeletal: Positive for back pain and arthralgias. Negative for neck stiffness.  Skin: Negative for rash.  Neurological: Positive for dizziness and light-headedness. Negative for syncope, speech difficulty, weakness, numbness and headaches.  Hematological: Negative for adenopathy. Does not bruise/bleed easily.  Psychiatric/Behavioral: Negative for suicidal ideas, behavioral problems, self-injury, dysphoric mood and agitation. The patient is not nervous/anxious.        Objective:   Physical Exam  Constitutional: She is oriented to person, place, and time. She appears well-developed and well-nourished.  HENT:  Head: Normocephalic and atraumatic.  Right Ear: External ear normal.  Left Ear: External ear normal.  Mouth/Throat: Oropharynx is clear and moist.  Eyes: Conjunctivae and EOM are normal.  Neck: Normal range of motion. Neck supple. No JVD present. No thyromegaly present.  Cardiovascular: Normal rate, regular rhythm, normal heart sounds and intact distal pulses.   No murmur heard. Pulmonary/Chest: Effort normal and breath sounds normal. She has no wheezes. She has no rales.  Abdominal: Soft. Bowel sounds are normal. She exhibits no distension and no mass. There is no tenderness. There is no rebound and no guarding.  Musculoskeletal: Normal range of motion. She exhibits no edema or tenderness.  Neurological: She is alert and oriented to person, place, and time. She has normal reflexes. No cranial nerve deficit. She exhibits normal muscle tone. Coordination normal.  Skin: Skin is warm and dry. No rash noted.  Psychiatric: She has a normal  mood and affect. Her behavior is normal.          Assessment & Plan:   Preventive Health exam  HTN Dyslipidemia History colonic polyps Remote left breast cancer   Nonobstructive coronary artery disease Paroxysmal atrial fibrillation  LV dysfunction Diabetes mellitus type 2.  Well-controlled on present therapy.  Hemoglobin A1c is now in a nondiabetic range.  We'll consider discontinuation of sulfonylurea therapy

## 2014-11-05 ENCOUNTER — Other Ambulatory Visit: Payer: Self-pay | Admitting: Cardiology

## 2014-11-09 ENCOUNTER — Other Ambulatory Visit: Payer: Self-pay | Admitting: Cardiology

## 2014-11-11 ENCOUNTER — Other Ambulatory Visit: Payer: Self-pay

## 2014-11-11 MED ORDER — AMIODARONE HCL 200 MG PO TABS: 200.0000 mg | ORAL_TABLET | Freq: Every day | ORAL | Status: DC

## 2014-11-17 ENCOUNTER — Other Ambulatory Visit: Payer: Self-pay | Admitting: Cardiology

## 2014-11-18 ENCOUNTER — Encounter: Payer: Self-pay | Admitting: Cardiology

## 2014-11-18 ENCOUNTER — Ambulatory Visit (INDEPENDENT_AMBULATORY_CARE_PROVIDER_SITE_OTHER): Payer: Medicare Other | Admitting: Cardiology

## 2014-11-18 VITALS — BP 132/74 | HR 87 | Ht 65.5 in | Wt 230.0 lb

## 2014-11-18 DIAGNOSIS — I2510 Atherosclerotic heart disease of native coronary artery without angina pectoris: Secondary | ICD-10-CM

## 2014-11-18 DIAGNOSIS — I4891 Unspecified atrial fibrillation: Secondary | ICD-10-CM

## 2014-11-18 DIAGNOSIS — E785 Hyperlipidemia, unspecified: Secondary | ICD-10-CM

## 2014-11-18 DIAGNOSIS — I5042 Chronic combined systolic (congestive) and diastolic (congestive) heart failure: Secondary | ICD-10-CM

## 2014-11-18 DIAGNOSIS — I5032 Chronic diastolic (congestive) heart failure: Secondary | ICD-10-CM | POA: Diagnosis not present

## 2014-11-18 DIAGNOSIS — R0602 Shortness of breath: Secondary | ICD-10-CM

## 2014-11-18 DIAGNOSIS — I420 Dilated cardiomyopathy: Secondary | ICD-10-CM

## 2014-11-18 DIAGNOSIS — I10 Essential (primary) hypertension: Secondary | ICD-10-CM | POA: Diagnosis not present

## 2014-11-18 DIAGNOSIS — I480 Paroxysmal atrial fibrillation: Secondary | ICD-10-CM

## 2014-11-18 MED ORDER — POTASSIUM CHLORIDE CRYS ER 20 MEQ PO TBCR: 20.0000 meq | EXTENDED_RELEASE_TABLET | Freq: Two times a day (BID) | ORAL | Status: DC

## 2014-11-18 MED ORDER — RIVAROXABAN 20 MG PO TABS: 20.0000 mg | ORAL_TABLET | Freq: Every day | ORAL | Status: DC

## 2014-11-18 MED ORDER — ATORVASTATIN CALCIUM 40 MG PO TABS: 40.0000 mg | ORAL_TABLET | Freq: Every day | ORAL | Status: DC

## 2014-11-18 NOTE — Progress Notes (Signed)
Cardiology Office Note   Date:  11/18/2014   ID:  Cynthia Herrera, Cynthia Herrera Jan 01, 1946, MRN [id number]  PCP:  Nyoka Cowden, MD  Cardiologist:   Sueanne Margarita, MD   Chief Complaint  Patient presents with  . Atrial Fibrillation    med. refills      History of Present Illness: Cynthia Herrera is a 69 y.o. 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 She was also in atrial fibrillation with RVR. She was diuresed and ruled out for MI. Echo showed EF 15-20%. She was started on Amiodarone and on 11/12/2013 underwent TEE/DCCV to NSR but failed to hold NSR. She was again cardioverted on 1/21 after more loading with amio but failed to convert. Later that day she spontaneously converted to NSR. Her LV dysfunction persisted after maintenance of NSR so she underwent cardiac cath which showed calcified coronary arteries with no obstructive lesions and mildly reduced LVF at 45% and normal right heart pressures.  The last OV she was complaining of dizziness and fatigue and her beta blocker was stopped.  She is now feeling better and has not had any further dizziness and fatigue has resolved.  She denies any chest pain, LE edema, palpitations or syncope.  She has noticed some DOE when out walking the dog out in the cold.  She has gained about 5 pounds since I saw her last.   Past Medical History  Diagnosis Date  . Personal history of colonic polyps     adenomas 03 and 08  . Hyperlipidemia     takes Lipitor daily  . Osteopenia   . Vitamin D deficiency     takes Vit D  . Allergy     seasonal  . Arthritis   . Breast cancer     left: surgery,chemo,radiation  . Atrial fibrillation     s/p TEE DCCV and repeat DCCV 11/14/2013  . DCM (dilated cardiomyopathy)     EF 15-20% ? tachycardia induced  . Hypertension     takes Losartan and Metoprolol.   Marland Kitchen  Dysrhythmia     A fib-takes AMiodarone,Digoxin,and Xarelto daily  . Chronic systolic CHF (congestive heart failure)     takes Furosemide and Aldactone daily  . COPD (chronic obstructive pulmonary disease)     no inhalers   . History of bronchitis 15+yrs ago  . Joint pain   . Chronic back pain     lumbar stenosis  . Muscle spasm     takes Flexeril daily as needed   . GERD (gastroesophageal reflux disease)     once in a while;depends on what she eats  . Nocturia   . Cataracts, bilateral     immature  . Diabetes mellitus without complication     recent dx    Past Surgical History  Procedure Laterality Date  . Cholecystectomy  2004  . Colonoscopy  2003, September 2008, April 01, 2011    adenomas 03 and 08, polyp 12, diverticulosis  . Joint replacement      totally replacement surgery  . Tubal ligation    . Breast lumpectomy  2002  . Port-a-cath removal    . Total knee arthroplasty  2006  . Tee without cardioversion N/A 11/12/2013    Procedure: TRANSESOPHAGEAL ECHOCARDIOGRAM (TEE);  Surgeon: Thayer Headings, MD;  Location: [place];  Service: Cardiovascular;  Laterality: N/A;  pt b/p low,  pt buccal membranes very dry, lips scapped, pt c/o thirst. NPO since MN and iv FLUIDS TOTAL INFUSING AT TOTAL 20ML HR....  Dr. Cathie Olden order allow NS to bolus during procedure....very dry,NS bolus 250 ml total..pt responding well to meds..  . Cardioversion N/A 11/12/2013    Procedure: CARDIOVERSION;  Surgeon: Thayer Headings, MD;  Location: [facility] ENDOSCOPY;  Service: Cardiovascular;  Laterality: N/A;  10:08  Dr. Marissa Nestle, anesthesia present, Lido   60mg ,  propofol 50mg , IV for elective cardioversion....Dr. Cathie Olden delievered synch 120 joules with successful cardioversion to NSR  . Cardioversion N/A 11/12/2013    Procedure: CARDIOVERSION;  Surgeon: Thayer Headings, MD;  Location: [place];  Service: Cardiovascular;  Laterality: N/A;  . Cardioversion N/A 11/14/2013    Procedure: CARDIOVERSION  (BEDSIDE);  Surgeon: Larey Dresser, MD;  Location: [place];  Service: Cardiovascular;  Laterality: N/A;  Shanda Howells a cath placed    . Mastectomy  July 2002    left  . Cardiac catheterization  2015  . Lumbar laminectomy/decompression microdiscectomy N/A 03/19/2014    Procedure: LUMBAR LAMINECTOMY/DECOMPRESSION MICRODISCECTOMY 4 LEVEL;  Surgeon: Kristeen Miss, MD;  Location: [place] NEURO ORS;  Service: Neurosurgery;  Laterality: N/A;  L1-2 L2-3 L3-4 L4-5 Laminectomy/Foraminotomy  . Left and right heart catheterization with coronary angiogram N/A 02/01/2014    Procedure: LEFT AND RIGHT HEART CATHETERIZATION WITH CORONARY ANGIOGRAM;  Surgeon: Sueanne Margarita, MD;  Location: [place] CATH LAB;  Service: Cardiovascular;  Laterality: N/A;     Current Outpatient Prescriptions  Medication Sig Dispense Refill  . amiodarone (PACERONE) 200 MG tablet Take 1 tablet (200 mg total) by mouth daily. 100 tablet 3  . atorvastatin (LIPITOR) 20 MG tablet TAKE 1 TABLET AT BEDTIME 30 tablet 5  . calcium carbonate (OS-CAL) 600 MG TABS Take 1,200 mg by mouth daily with breakfast.     . Cholecalciferol (VITAMIN D) 2000 UNITS tablet Take 2,000 Units by mouth daily.     . cyclobenzaprine (FLEXERIL) 10 MG tablet Take 1 tablet (10 mg total) by mouth 3 (three) times daily as needed for muscle spasms. 30 tablet 2  . fluticasone (FLONASE) 50 MCG/ACT nasal spray Place 1 spray into both nostrils daily as needed for allergies.    . furosemide (LASIX) 20 MG tablet Take 2 tablets (40 mg total) by mouth daily. 60 tablet 5  . glipiZIDE (GLUCOTROL XL) 2.5 MG 24 hr tablet Take 1 tablet (2.5 mg total) by mouth daily with breakfast. 90 tablet 2  . glucose blood (ONETOUCH VERIO) test strip 1 each by Other route daily as needed for other. 100 each 12  . losartan (COZAAR) 50 MG tablet TAKE 1/2 TABLET (25MG ) BY MOUTH TWICE A DAY 30 tablet 5  . meloxicam (MOBIC) 15 MG tablet Take 1 tablet (15 mg total) by mouth daily. 30 tablet 3  . metFORMIN (GLUCOPHAGE)  500 MG tablet Take 1 tablet (500 mg total) by mouth 2 (two) times daily with a meal. 180 tablet 3  . Multiple Vitamin (MULTIVITAMIN) tablet Take 1 tablet by mouth daily.     . potassium chloride SA (K-DUR,KLOR-CON) 20 MEQ tablet Take 1 tablet (20 mEq total) by mouth 2 (two) times daily. 60 tablet 11  . Rivaroxaban (XARELTO) 20 MG TABS tablet Take 1 tablet (20 mg total) by mouth daily with supper. Restart on Saturday am 4/11 30 tablet 5  . spironolactone (ALDACTONE) 25 MG tablet TAKE 1 TABLET BY MOUTH EVERY DAY 30 tablet 5   No current facility-administered medications for  this visit.    Allergies:   Codeine phosphate    Social History:  The patient  reports that she quit smoking about 13 years ago. She has never used smokeless tobacco. She reports that she does not drink alcohol or use illicit drugs.   Family History:  The patient's family history includes Cancer in her mother; Dementia in her sister; Diabetes in her brother, father, mother, and sister; Heart disease in her brother and father; Liver cancer in her mother.    ROS:  Please see the history of present illness.   Otherwise, review of systems are positive for none.   All other systems are reviewed and negative.    PHYSICAL EXAM: VS:  BP 132/74 mmHg  Pulse 87  Ht 5' 5.5" (1.664 m)  Wt 230 lb (104.327 kg)  BMI 37.68 kg/m2  SpO2 97% , BMI Body mass index is 37.68 kg/(m^2). GEN: Well nourished, well developed, in no acute distress HEENT: normal Neck: no JVD, carotid bruits, or masses Cardiac: RRR; no murmurs, rubs, or gallops,no edema  Respiratory:  clear to auscultation bilaterally, normal work of breathing GI: soft, nontender, nondistended, + BS MS: no deformity or atrophy Skin: warm and dry, no rash Neuro:  Strength and sensation are intact Psych: euthymic mood, full affect   EKG:  EKG is not ordered today.    Recent Labs: 10/14/2014: ALT 20; BUN 31*; Creatinine 1.2; Hemoglobin 11.9*; Platelets 327.0; Potassium  4.5; Sodium 135; TSH 0.58    Lipid Panel    Component Value Date/Time   CHOL 155 10/14/2014 1136   TRIG 165.0* 10/14/2014 1136   HDL 32.20* 10/14/2014 1136   CHOLHDL 5 10/14/2014 1136   VLDL 33.0 10/14/2014 1136   LDLCALC 90 10/14/2014 1136      Wt Readings from Last 3 Encounters:  11/18/14 230 lb (104.327 kg)  10/24/14 233 lb (105.688 kg)  08/20/14 228 lb (103.42 kg)    EKG;  NSR with nonspecific T wave abnormality and LAE  Other studies Reviewed: Additional studies/ records that were reviewed today include: none.  ASSESSMENT AND PLAN:  1. Nonobstructive ASCAD - not on ASA due to Xarelto 2. Mild LV dysfunction EF 45% by cath and 50-55% by echo - presumed tachycardia induced from prior afib with RVR - EF has improved after maintenance of NSR.  3. Chronic combined systolic/diastolic CHF  - appears well compensated but complains of SOB with exertion.  She has a lot of allergies and head congestion at night and also has COPD.  I will check a BNP and BMET.  I will also check PFTs with DLCO since she is on Amio to make sure she is not having any decline in lung function.   - continue ARB/lasix and Aldactone -  BB and dig stopped due to bradycardia and fatigue 4. PAF maintaining NSR post DCCV - continue Xarelto/Amio  5. HTN well controlled - continue Losartan/Aldactone        6.  Dyslipidemia - LDL goal is <90 and her LDL last month was 90.  Will increase atorvastatin to 40mg  daily and recheck FLP and ALT in 6 months  6. Fatigue - resolved after stopping BB    Current medicines are reviewed at length with the patient today.  The patient does not have concerns regarding medicines.  The following changes have been made:  no change  Labs/ tests ordered today include: BNP and BMET  No orders of the defined types were placed in this encounter.  Disposition:   FU with me in 6 months   Signed, Sueanne Margarita, MD  11/18/2014 4:02 PM    [facility]  [facility] [place], [place], [place]  [address] Phone: [phone]; Fax: [phone]

## 2014-11-18 NOTE — Patient Instructions (Signed)
Your physician has recommended you make the following change in your medication:  1) INCREASE LIPITOR to 40 mg daily  Your physician recommends that you have lab work TODAY (BMET, BNP).  Your physician recommends that you return for FASTING lab work 6 weeks.   Your physician has recommended that you have a pulmonary function test. Pulmonary Function Tests are a group of tests that measure how well air moves in and out of your lungs.  Your physician wants you to follow-up in: 6 months with Dr. Radford Pax. You will receive a reminder letter in the mail two months in advance. If you don't receive a letter, please call our office to schedule the follow-up appointment.

## 2014-11-19 LAB — BASIC METABOLIC PANEL
BUN: 24 mg/dL — AB (ref 6–23)
CO2: 24 meq/L (ref 19–32)
Calcium: 10.2 mg/dL (ref 8.4–10.5)
Chloride: 104 mEq/L (ref 96–112)
Creatinine, Ser: 1.32 mg/dL — ABNORMAL HIGH (ref 0.40–1.20)
GFR: 42.4 mL/min — ABNORMAL LOW (ref >60.00)
GLUCOSE: 92 mg/dL (ref 70–99)
POTASSIUM: 4.6 meq/L (ref 3.5–5.1)
Sodium: 139 mEq/L (ref 135–145)

## 2014-11-19 LAB — BRAIN NATRIURETIC PEPTIDE: PRO B NATRI PEPTIDE: 58 pg/mL (ref 0.0–100.0)

## 2014-11-20 ENCOUNTER — Telehealth: Payer: Self-pay

## 2014-11-20 DIAGNOSIS — I10 Essential (primary) hypertension: Secondary | ICD-10-CM

## 2014-11-20 MED ORDER — POTASSIUM CHLORIDE CRYS ER 20 MEQ PO TBCR: 20.0000 meq | EXTENDED_RELEASE_TABLET | Freq: Every day | ORAL | Status: DC

## 2014-11-20 NOTE — Telephone Encounter (Signed)
-----   Message from Sueanne Margarita, MD sent at 11/19/2014  4:44 PM EST ----- BNP is normal so SOB not from CHF>  BMET stable.  Please have her decrease Kdur to 60meq daily and recheck BMET in 1 week (she is on aldactone/Lasix and potassium and K+ is trending upward)

## 2014-11-20 NOTE — Telephone Encounter (Signed)
Patient informed of results and verbal understanding expressed.  Instructed patient to DECREASE Kdur to 20 meq daily. Repeat BMET scheduled for 2/4. Patient agrees with treatment plan.

## 2014-11-28 ENCOUNTER — Other Ambulatory Visit (INDEPENDENT_AMBULATORY_CARE_PROVIDER_SITE_OTHER): Payer: Medicare Other | Admitting: *Deleted

## 2014-11-28 DIAGNOSIS — I10 Essential (primary) hypertension: Secondary | ICD-10-CM

## 2014-11-28 LAB — BASIC METABOLIC PANEL
BUN: 23 mg/dL (ref 6–23)
CALCIUM: 9.4 mg/dL (ref 8.4–10.5)
CHLORIDE: 107 meq/L (ref 96–112)
CO2: 27 mEq/L (ref 19–32)
Creatinine, Ser: 1.13 mg/dL (ref 0.40–1.20)
GFR: 50.72 mL/min — AB (ref >60.00)
GLUCOSE: 144 mg/dL — AB (ref 70–99)
Potassium: 3.7 mEq/L (ref 3.5–5.1)
SODIUM: 140 meq/L (ref 135–145)

## 2014-11-30 ENCOUNTER — Other Ambulatory Visit: Payer: Self-pay | Admitting: Cardiology

## 2014-12-03 ENCOUNTER — Telehealth: Payer: Self-pay | Admitting: Cardiology

## 2014-12-03 NOTE — Telephone Encounter (Signed)
**Note De-Identified Cynthia Herrera Obfuscation** The pt has been given her BMET results, she verbalized understanding.

## 2014-12-03 NOTE — Telephone Encounter (Signed)
Follow Up   Pt calling to follow up on results from last week. Please call.

## 2014-12-05 ENCOUNTER — Ambulatory Visit (INDEPENDENT_AMBULATORY_CARE_PROVIDER_SITE_OTHER): Payer: Medicare Other | Admitting: Internal Medicine

## 2014-12-05 DIAGNOSIS — R0602 Shortness of breath: Secondary | ICD-10-CM | POA: Diagnosis not present

## 2014-12-05 LAB — PULMONARY FUNCTION TEST
DL/VA % PRED: 82 %
DL/VA: 4.27 ml/min/mmHg/L
DLCO UNC % PRED: 65 %
DLCO unc: 18.54 ml/min/mmHg
FEF 25-75 POST: 2.65 L/s
FEF 25-75 Pre: 3.1 L/sec
FEF2575-%Change-Post: -14 %
FEF2575-%PRED-PRE: 146 %
FEF2575-%Pred-Post: 125 %
FEV1-%Change-Post: -1 %
FEV1-%Pred-Post: 88 %
FEV1-%Pred-Pre: 90 %
FEV1-PRE: 2.34 L
FEV1-Post: 2.3 L
FEV1FVC-%Change-Post: 0 %
FEV1FVC-%PRED-PRE: 111 %
FEV6-%Change-Post: -1 %
FEV6-%PRED-POST: 83 %
FEV6-%PRED-PRE: 84 %
FEV6-POST: 2.72 L
FEV6-Pre: 2.76 L
FEV6FVC-%PRED-POST: 104 %
FEV6FVC-%PRED-PRE: 104 %
FVC-%CHANGE-POST: -1 %
FVC-%PRED-PRE: 81 %
FVC-%Pred-Post: 80 %
FVC-POST: 2.72 L
FVC-PRE: 2.76 L
PRE FEV1/FVC RATIO: 85 %
Post FEV1/FVC ratio: 84 %
Post FEV6/FVC ratio: 100 %
Pre FEV6/FVC Ratio: 100 %

## 2014-12-05 NOTE — Progress Notes (Signed)
PFT done today. 

## 2015-01-01 ENCOUNTER — Other Ambulatory Visit (INDEPENDENT_AMBULATORY_CARE_PROVIDER_SITE_OTHER): Payer: Medicare Other | Admitting: *Deleted

## 2015-01-01 ENCOUNTER — Other Ambulatory Visit: Payer: Medicare Other

## 2015-01-01 DIAGNOSIS — E785 Hyperlipidemia, unspecified: Secondary | ICD-10-CM

## 2015-01-01 LAB — LIPID PANEL
Cholesterol: 125 mg/dL (ref 0–200)
HDL: 39 mg/dL — AB (ref >39.00)
LDL CALC: 61 mg/dL (ref 0–99)
NonHDL: 86
Total CHOL/HDL Ratio: 3
Triglycerides: 127 mg/dL (ref 0.0–149.0)
VLDL: 25.4 mg/dL (ref 0.0–40.0)

## 2015-01-01 LAB — HEPATIC FUNCTION PANEL
ALT: 22 U/L (ref 0–35)
AST: 22 U/L (ref 0–37)
Albumin: 4.3 g/dL (ref 3.5–5.2)
Alkaline Phosphatase: 102 U/L (ref 39–117)
BILIRUBIN TOTAL: 0.7 mg/dL (ref 0.2–1.2)
Bilirubin, Direct: 0.2 mg/dL (ref 0.0–0.3)
TOTAL PROTEIN: 7.1 g/dL (ref 6.0–8.3)

## 2015-01-03 ENCOUNTER — Telehealth: Payer: Self-pay

## 2015-01-03 NOTE — Telephone Encounter (Signed)
Called to give lab results.  Patient concerned about chronic dizziness. She st it isn't getting worse, it just isn't getting better either.  She st when she stands up, she could very easily fall (but has not).  She st she finds herself staggering every once in a while due to the dizziness as well. Patient st she is staying well hydrated and her BS has been stable.  Her BP is unknown-she does not have a cuff.  Patient C/O no other symptoms.  To Dr. Radford Pax for recommendations.

## 2015-01-03 NOTE — Telephone Encounter (Signed)
Instructed patient to STOP amio for one week and call with an update. Patient agrees with treatment plan.

## 2015-01-03 NOTE — Telephone Encounter (Signed)
This could be from [place] - please have her hold her amio for a week and call to let us know if she feels better

## 2015-01-05 ENCOUNTER — Other Ambulatory Visit: Payer: Self-pay | Admitting: Internal Medicine

## 2015-01-10 ENCOUNTER — Telehealth: Payer: Self-pay | Admitting: Cardiology

## 2015-01-10 NOTE — Telephone Encounter (Signed)
Follow Up ° ° ° ° ° °Pt returning Katy's phone call. °

## 2015-01-10 NOTE — Telephone Encounter (Signed)
Patient st she feels much better off the amiodarone and has no complaints of dizziness. Per Dr. Radford Pax, patient is to have an OV in two months to FU. OV scheduled for 5/18.  Patient agrees with treatment plan.

## 2015-01-21 DIAGNOSIS — I7381 Erythromelalgia: Secondary | ICD-10-CM | POA: Diagnosis not present

## 2015-02-07 ENCOUNTER — Telehealth: Payer: Self-pay | Admitting: Cardiology

## 2015-02-07 MED ORDER — LOSARTAN POTASSIUM 25 MG PO TABS: 25.0000 mg | ORAL_TABLET | Freq: Two times a day (BID) | ORAL | Status: DC

## 2015-02-07 NOTE — Telephone Encounter (Signed)
New message      Nurse at [facility] want pts recent bp and pulse.  She also said that the pt is not taking her losartan.  Is she supposed to be taking it? Patient has no idea why she is not taking it.

## 2015-02-07 NOTE — Telephone Encounter (Signed)
Left message on confidential VM that patient is supposed to be taking Losartan.  Called patient and confirmed with her she is to take her Losartan. Patient st she needs a refill. Sent Rx to pharmacy of choice.

## 2015-02-12 ENCOUNTER — Other Ambulatory Visit: Payer: Self-pay

## 2015-02-12 MED ORDER — ATORVASTATIN CALCIUM 40 MG PO TABS: 40.0000 mg | ORAL_TABLET | Freq: Every day | ORAL | Status: DC

## 2015-02-12 MED ORDER — FUROSEMIDE 20 MG PO TABS: 40.0000 mg | ORAL_TABLET | Freq: Every day | ORAL | Status: DC

## 2015-02-12 MED ORDER — POTASSIUM CHLORIDE CRYS ER 20 MEQ PO TBCR: 20.0000 meq | EXTENDED_RELEASE_TABLET | Freq: Every day | ORAL | Status: DC

## 2015-02-14 ENCOUNTER — Other Ambulatory Visit: Payer: Self-pay

## 2015-02-14 MED ORDER — SPIRONOLACTONE 25 MG PO TABS: 25.0000 mg | ORAL_TABLET | Freq: Every day | ORAL | Status: DC

## 2015-02-25 ENCOUNTER — Telehealth: Payer: Self-pay | Admitting: Cardiology

## 2015-02-25 NOTE — Telephone Encounter (Signed)
Confirmed with patient that as of 1/27, she was to take 20 meq K daily. Called and verified dose with pharmacy as well.

## 2015-02-25 NOTE — Telephone Encounter (Signed)
New Message  Petra Kuba from [facility] calling to clarify a prescription of Chloricon 10- 1 tab/day versus 1 tab 2 times/day. Please call back and dsicuss.

## 2015-03-12 ENCOUNTER — Ambulatory Visit (INDEPENDENT_AMBULATORY_CARE_PROVIDER_SITE_OTHER): Payer: Medicare Other | Admitting: Cardiology

## 2015-03-12 ENCOUNTER — Encounter: Payer: Self-pay | Admitting: Cardiology

## 2015-03-12 VITALS — BP 128/60 | HR 87 | Ht 65.5 in | Wt 236.4 lb

## 2015-03-12 DIAGNOSIS — I420 Dilated cardiomyopathy: Secondary | ICD-10-CM

## 2015-03-12 DIAGNOSIS — I480 Paroxysmal atrial fibrillation: Secondary | ICD-10-CM

## 2015-03-12 DIAGNOSIS — I2510 Atherosclerotic heart disease of native coronary artery without angina pectoris: Secondary | ICD-10-CM

## 2015-03-12 DIAGNOSIS — I10 Essential (primary) hypertension: Secondary | ICD-10-CM

## 2015-03-12 DIAGNOSIS — I5042 Chronic combined systolic (congestive) and diastolic (congestive) heart failure: Secondary | ICD-10-CM

## 2015-03-12 DIAGNOSIS — G4719 Other hypersomnia: Secondary | ICD-10-CM

## 2015-03-12 NOTE — Patient Instructions (Addendum)
Medication Instructions:  Your physician recommends that you continue on your current medications as directed. Please refer to the Current Medication list given to you today.   Labwork: None  Testing/Procedures: Your physician has recommended that you have a sleep study. This test records several body functions during sleep, including: brain activity, eye movement, oxygen and carbon dioxide blood levels, heart rate and rhythm, breathing rate and rhythm, the flow of air through your mouth and nose, snoring, body muscle movements, and chest and belly movement.  Follow-Up: Your physician wants you to follow-up in: 6 months with Dr. Radford Pax. You will receive a reminder letter in the mail two months in advance. If you don't receive a letter, please call our office to schedule the follow-up appointment.   Any Other Special Instructions Will Be Listed Below (If Applicable).

## 2015-03-12 NOTE — Progress Notes (Signed)
Cardiology Office Note   Date:  03/12/2015   ID:  [name], DOB November 28, 1945, MRN [id number]  PCP:  Nyoka Cowden, MD  Cardiologist:   Sueanne Margarita, MD   Chief Complaint  Patient presents with  . Follow-up    Shortness of breath, chroinc diastolic heart failure      History of Present Illness: Cynthia Herrera is a 69y.o. 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 She was again cardioverted on 1/21 after more loading with amio but failed to convert. Later that day she spontaneously converted to NSR. She stopped amio due to severe dizziness. She denies any chest pain, SOB, DOE, palpitations or syncope.Occasionally she will have some LE edema she says if she does not sleep well.  She complains of wakening during the night since her husband died.  She says she does not think that she snores.  She is complaining of nonrestorative sleep and daytime sleepiness.     Past Medical History  Diagnosis Date  . Personal history of colonic polyps     adenomas 03 and 08  . Hyperlipidemia     takes Lipitor daily  . Osteopenia   . Vitamin D deficiency     takes Vit D  . Allergy     seasonal  . Arthritis   . Breast cancer     left: surgery,chemo,radiation  . Atrial fibrillation     s/p TEE DCCV and repeat DCCV 11/14/2013  . DCM (dilated cardiomyopathy)     EF 15-20% ? tachycardia induced  . Hypertension     takes Losartan and Metoprolol.   Marland Kitchen Dysrhythmia     A fib-takes AMiodarone,Digoxin,and Xarelto daily  . Chronic systolic CHF (congestive heart failure)     takes Furosemide and Aldactone daily  . COPD (chronic obstructive pulmonary disease)     no inhalers   . History of bronchitis 15+yrs ago  . Joint pain   . Chronic back  pain     lumbar stenosis  . Muscle spasm     takes Flexeril daily as needed   . GERD (gastroesophageal reflux disease)     once in a while;depends on what she eats  . Nocturia   . Cataracts, bilateral     immature  . Diabetes mellitus without complication     recent dx    Past Surgical History  Procedure Laterality Date  . Cholecystectomy  2004  . Colonoscopy  2003, September 2008, April 01, 2011    adenomas 03 and 08, polyp 12, diverticulosis  . Joint replacement      totally replacement surgery  . Tubal ligation    . Breast lumpectomy  2002  . Port-a-cath removal    . Total knee arthroplasty  2006  . Tee without cardioversion N/A 11/12/2013    Procedure: TRANSESOPHAGEAL ECHOCARDIOGRAM (TEE);  Surgeon: Thayer Headings, MD;  Location: [place];  Service: Cardiovascular;  Laterality: N/A;  pt b/p low, pt buccal membranes very dry, lips scapped, pt c/o thirst. NPO since MN and iv FLUIDS TOTAL INFUSING AT TOTAL 20ML HR....  Dr. Cathie Olden order allow NS to bolus during procedure....very dry,NS bolus 250 ml total..pt responding well to meds..  . Cardioversion N/A 11/12/2013    Procedure: CARDIOVERSION;  Surgeon: Thayer Headings, MD;  Location: [facility] ENDOSCOPY;  Service: Cardiovascular;  Laterality: N/A;  10:08  Dr. Marissa Nestle, anesthesia present, Lido   60mg ,  propofol 50mg , IV for elective cardioversion....Dr. Cathie Olden delievered synch 120 joules with successful cardioversion to NSR  . Cardioversion N/A 11/12/2013    Procedure: CARDIOVERSION;  Surgeon: Thayer Headings, MD;  Location: [place];  Service: Cardiovascular;  Laterality: N/A;  . Cardioversion N/A 11/14/2013    Procedure: CARDIOVERSION (BEDSIDE);  Surgeon: Larey Dresser, MD;  Location: [place];  Service: Cardiovascular;  Laterality: N/A;  Shanda Howells a cath placed    . Mastectomy  July 2002    left  . Cardiac catheterization  2015  . Lumbar laminectomy/decompression microdiscectomy N/A 03/19/2014    Procedure: LUMBAR  LAMINECTOMY/DECOMPRESSION MICRODISCECTOMY 4 LEVEL;  Surgeon: Kristeen Miss, MD;  Location: [place] NEURO ORS;  Service: Neurosurgery;  Laterality: N/A;  L1-2 L2-3 L3-4 L4-5 Laminectomy/Foraminotomy  . Left and right heart catheterization with coronary angiogram N/A 02/01/2014    Procedure: LEFT AND RIGHT HEART CATHETERIZATION WITH CORONARY ANGIOGRAM;  Surgeon: Sueanne Margarita, MD;  Location: [place] CATH LAB;  Service: Cardiovascular;  Laterality: N/A;     Current Outpatient Prescriptions  Medication Sig Dispense Refill  . amiodarone (PACERONE) 200 MG tablet Take 1 tablet (200 mg total) by mouth daily. 100 tablet 3  . atorvastatin (LIPITOR) 40 MG tablet Take 1 tablet (40 mg total) by mouth at bedtime. 90 tablet 3  . calcium carbonate (OS-CAL) 600 MG TABS Take 1,200 mg by mouth daily with breakfast.     . Cholecalciferol (VITAMIN D) 2000 UNITS tablet Take 2,000 Units by mouth daily.     . cyclobenzaprine (FLEXERIL) 10 MG tablet Take 1 tablet (10 mg total) by mouth 3 (three) times daily as needed for muscle spasms. 30 tablet 2  . fluticasone (FLONASE) 50 MCG/ACT nasal spray Place 1 spray into both nostrils daily as needed for allergies.    . furosemide (LASIX) 20 MG tablet Take 2 tablets (40 mg total) by mouth daily. 180 tablet 3  . glipiZIDE (GLUCOTROL XL) 2.5 MG 24 hr tablet TAKE 1 TABLET (2.5 MG TOTAL) BY MOUTH DAILY WITH BREAKFAST. 90 tablet 1  . glucose blood (ONETOUCH VERIO) test strip 1 each by Other route daily as needed for other. 100 each 12  . losartan (COZAAR) 25 MG tablet Take 1 tablet (25 mg total) by mouth 2 (two) times daily. 60 tablet 6  . metFORMIN (GLUCOPHAGE) 500 MG tablet Take 1 tablet (500 mg total) by mouth 2 (two) times daily with a meal. 180 tablet 3  . methocarbamol (ROBAXIN) 500 MG tablet Take 1 tablet by mouth as needed. For muscle spasms  3  . Multiple Vitamin (MULTIVITAMIN) tablet Take 1 tablet by mouth daily.     . potassium chloride SA (K-DUR,KLOR-CON) 20 MEQ tablet Take 1  tablet (20 mEq total) by mouth daily. 180 tablet 3  . rivaroxaban (XARELTO) 20 MG TABS tablet Take 1 tablet (20 mg total) by mouth daily with supper. 30 tablet 5  . spironolactone (ALDACTONE) 25 MG tablet Take 1 tablet (25 mg total) by mouth daily. 90 tablet 1   No current facility-administered medications for this visit.    Allergies:   Codeine phosphate    Social History:  The patient  reports that she quit smoking about 13 years ago. She has never used smokeless tobacco. She reports that she does not drink alcohol or use illicit drugs.   Family History:  The patient's family history includes  Cancer in her mother; Dementia in her sister; Diabetes in her brother, father, mother, and sister; Heart disease in her brother and father; Liver cancer in her mother.    ROS:  Please see the history of present illness.   Otherwise, review of systems are positive for none.   All other systems are reviewed and negative.    PHYSICAL EXAM: VS:  BP 128/60 mmHg  Pulse 87  Ht 5' 5.5" (1.664 m)  Wt 236 lb 6.4 oz (107.23 kg)  BMI 38.73 kg/m2  SpO2 95% , BMI Body mass index is 38.73 kg/(m^2). GEN: Well nourished, well developed, in no acute distress HEENT: normal Neck: no JVD, carotid bruits, or masses Cardiac: RRR; no murmurs, rubs, or gallops,no edema  Respiratory:  clear to auscultation bilaterally, normal work of breathing GI: soft, nontender, nondistended, + BS MS: no deformity or atrophy Skin: warm and dry, no rash Neuro:  Strength and sensation are intact Psych: euthymic mood, full affect   EKG:  EKG is not ordered today.    Recent Labs: 10/14/2014: Hemoglobin 11.9*; Platelets 327.0; TSH 0.58 11/18/2014: Pro B Natriuretic peptide (BNP) 58.0 11/28/2014: BUN 23; Creatinine 1.13; Potassium 3.7; Sodium 140 01/01/2015: ALT 22    Lipid Panel    Component Value Date/Time   CHOL 125 01/01/2015 1130   TRIG 127.0 01/01/2015 1130   HDL 39.00* 01/01/2015 1130   CHOLHDL 3 01/01/2015 1130    VLDL 25.4 01/01/2015 1130   LDLCALC 61 01/01/2015 1130      Wt Readings from Last 3 Encounters:  03/12/15 236 lb 6.4 oz (107.23 kg)  11/18/14 230 lb (104.327 kg)  10/24/14 233 lb (105.688 kg)     ASSESSMENT AND PLAN:  1. Nonobstructive ASCAD - not on ASA due to Xarelto 2. Mild LV dysfunction EF 45% by cath and 50-55% by echo - presumed tachycardia induced from prior afib with RVR - EF has improved after maintenance of NSR.  3. Chronic combined systolic/diastolic CHF - appears well compensated   - continue ARB/lasix and Aldactone - BB and dig stopped due to bradycardia and fatigue 4. PAF maintaining NSR post DCCV - she feels better off Amio - continue Xarelto 5. HTN well controlled - continue Losartan/Aldactone   6. Dyslipidemia - LDL goal is <70 and her LDL last month was 61.  7. excessive daytime sleepiness with poor sleep at night.  This started after her husbands death but given history of afib and CHF will get a sleep study to rule out OSA.     Current medicines are reviewed at length with the patient today.  The patient does not have concerns regarding medicines.  The following changes have been made:  no change  No orders of the defined types were placed in this encounter.     Disposition:   FU with me in 6 months  Signed, Sueanne Margarita, MD  03/12/2015 3:42 PM    [facility] [place], [place],   [address] Phone: [phone]; Fax: [phone]

## 2015-03-18 DIAGNOSIS — B359 Dermatophytosis, unspecified: Secondary | ICD-10-CM | POA: Diagnosis not present

## 2015-03-18 DIAGNOSIS — L821 Other seborrheic keratosis: Secondary | ICD-10-CM | POA: Diagnosis not present

## 2015-03-18 DIAGNOSIS — B353 Tinea pedis: Secondary | ICD-10-CM | POA: Diagnosis not present

## 2015-03-18 DIAGNOSIS — D239 Other benign neoplasm of skin, unspecified: Secondary | ICD-10-CM | POA: Diagnosis not present

## 2015-04-02 ENCOUNTER — Other Ambulatory Visit: Payer: Self-pay | Admitting: Internal Medicine

## 2015-04-07 ENCOUNTER — Encounter: Payer: Self-pay | Admitting: *Deleted

## 2015-04-29 ENCOUNTER — Other Ambulatory Visit: Payer: Self-pay

## 2015-04-29 ENCOUNTER — Other Ambulatory Visit: Payer: Self-pay | Admitting: Internal Medicine

## 2015-04-29 DIAGNOSIS — N63 Unspecified lump in unspecified breast: Secondary | ICD-10-CM

## 2015-04-29 DIAGNOSIS — Z1231 Encounter for screening mammogram for malignant neoplasm of breast: Secondary | ICD-10-CM

## 2015-05-01 ENCOUNTER — Telehealth: Payer: Self-pay | Admitting: Internal Medicine

## 2015-05-01 ENCOUNTER — Ambulatory Visit (INDEPENDENT_AMBULATORY_CARE_PROVIDER_SITE_OTHER): Payer: Medicare Other | Admitting: Internal Medicine

## 2015-05-01 ENCOUNTER — Encounter: Payer: Self-pay | Admitting: Internal Medicine

## 2015-05-01 VITALS — BP 128/80 | HR 73 | Temp 98.1°F | Resp 20 | Ht 65.5 in | Wt 236.0 lb

## 2015-05-01 DIAGNOSIS — E119 Type 2 diabetes mellitus without complications: Secondary | ICD-10-CM | POA: Diagnosis not present

## 2015-05-01 DIAGNOSIS — I480 Paroxysmal atrial fibrillation: Secondary | ICD-10-CM

## 2015-05-01 DIAGNOSIS — I10 Essential (primary) hypertension: Secondary | ICD-10-CM | POA: Diagnosis not present

## 2015-05-01 DIAGNOSIS — R0609 Other forms of dyspnea: Secondary | ICD-10-CM

## 2015-05-01 DIAGNOSIS — G4719 Other hypersomnia: Secondary | ICD-10-CM

## 2015-05-01 LAB — HEMOGLOBIN A1C: HEMOGLOBIN A1C: 4.8 % (ref 4.6–6.5)

## 2015-05-01 NOTE — Progress Notes (Signed)
Pre visit review using our clinic review tool, if applicable. No additional management support is needed unless otherwise documented below in the visit note. 

## 2015-05-01 NOTE — Progress Notes (Signed)
Subjective:    Patient ID: Cynthia Herrera, female    DOB: 1946-08-04, 69 y.o.   MRN: [id number]  HPI 69 year old patient who has type 2 diabetes controlled on oral medications.  Hemoglobin A1c's have generally been well controlled in a nondiabetic range.  No hypoglycemia. She has essential hypertension and remains on chronic anticoagulation due to atrial fibrillation. She has a history of dilated cardiomyopathy which has been stable. No cardiopulmonary complaints He is scheduled for an eye examination tomorrow  She has a prior history of lumbar spinal stenosis which has been stable.  Remains on atorvastatin, which she continues to tolerate well.  Past Medical History  Diagnosis Date  . Personal history of colonic polyps     adenomas 03 and 08  . Hyperlipidemia     takes Lipitor daily  . Osteopenia   . Vitamin D deficiency     takes Vit D  . Allergy     seasonal  . Arthritis   . Breast cancer     left: surgery,chemo,radiation  . Atrial fibrillation     s/p TEE DCCV and repeat DCCV 11/14/2013  . DCM (dilated cardiomyopathy)     EF 15-20% ? tachycardia induced  . Hypertension     takes Losartan and Metoprolol.   Marland Kitchen Dysrhythmia     A fib-takes AMiodarone,Digoxin,and Xarelto daily  . Chronic systolic CHF (congestive heart failure)     takes Furosemide and Aldactone daily  . COPD (chronic obstructive pulmonary disease)     no inhalers   . History of bronchitis 15+yrs ago  . Joint pain   . Chronic back pain     lumbar stenosis  . Muscle spasm     takes Flexeril daily as needed   . GERD (gastroesophageal reflux disease)     once in a while;depends on what she eats  . Nocturia   . Cataracts, bilateral     immature  . Diabetes mellitus without complication     recent dx    History   Social History  . Marital Status: Widowed    Spouse Name: N/A  . Number of Children: N/A  . Years of Education: N/A   Occupational History  . Not on file.   Social History Main  Topics  . Smoking status: Former Smoker    Quit date: 08/24/2001  . Smokeless tobacco: Never Used  . Alcohol Use: No  . Drug Use: No  . Sexual Activity: Not on file   Other Topics Concern  . Not on file   Social History Narrative    Past Surgical History  Procedure Laterality Date  . Cholecystectomy  2004  . Colonoscopy  2003, September 2008, April 01, 2011    adenomas 03 and 08, polyp 12, diverticulosis  . Joint replacement      totally replacement surgery  . Tubal ligation    . Breast lumpectomy  2002  . Port-a-cath removal    . Total knee arthroplasty  2006  . Tee without cardioversion N/A 11/12/2013    Procedure: TRANSESOPHAGEAL ECHOCARDIOGRAM (TEE);  Surgeon: Thayer Headings, MD;  Location: [place];  Service: Cardiovascular;  Laterality: N/A;  pt b/p low, pt buccal membranes very dry, lips scapped, pt c/o thirst. NPO since MN and iv FLUIDS TOTAL INFUSING AT TOTAL 20ML HR....  Dr. Cathie Olden order allow NS to bolus during procedure....very dry,NS bolus 250 ml total..pt responding well to meds..  . Cardioversion N/A 11/12/2013    Procedure: CARDIOVERSION;  Surgeon: Arnette Norris  Deboraha Sprang, MD;  Location: [place];  Service: Cardiovascular;  Laterality: N/A;  10:08  Dr. Marissa Nestle, anesthesia present, Lido   60mg ,  propofol 50mg , IV for elective cardioversion....Dr. Cathie Olden delievered synch 120 joules with successful cardioversion to NSR  . Cardioversion N/A 11/12/2013    Procedure: CARDIOVERSION;  Surgeon: Thayer Headings, MD;  Location: [place];  Service: Cardiovascular;  Laterality: N/A;  . Cardioversion N/A 11/14/2013    Procedure: CARDIOVERSION (BEDSIDE);  Surgeon: Larey Dresser, MD;  Location: [place];  Service: Cardiovascular;  Laterality: N/A;  Shanda Howells a cath placed    . Mastectomy  July 2002    left  . Cardiac catheterization  2015  . Lumbar laminectomy/decompression microdiscectomy N/A 03/19/2014    Procedure: LUMBAR LAMINECTOMY/DECOMPRESSION MICRODISCECTOMY 4 LEVEL;   Surgeon: Kristeen Miss, MD;  Location: [place] [facility];  Service: Neurosurgery;  Laterality: N/A;  L1-2 L2-3 L3-4 L4-5 Laminectomy/Foraminotomy  . Left and right heart catheterization with coronary angiogram N/A 02/01/2014    Procedure: LEFT AND RIGHT HEART CATHETERIZATION WITH CORONARY ANGIOGRAM;  Surgeon: Sueanne Margarita, MD;  Location: [place] CATH [facility];  Service: Cardiovascular;  Laterality: N/A;    Family History  Problem Relation Age of Onset  . Dementia Sister   . Diabetes Sister   . Diabetes Brother   . Heart disease Brother   . Liver cancer Mother   . Diabetes Mother   . Cancer Mother     pancreatic  . Diabetes Father   . Heart disease Father     Allergies  Allergen Reactions  . Codeine Phosphate Other (See Comments)    Weird feeling "    Current Outpatient Prescriptions on File Prior to Visit  Medication Sig Dispense Refill  . atorvastatin (LIPITOR) 40 MG tablet Take 1 tablet (40 mg total) by mouth at bedtime. 90 tablet 3  . calcium carbonate (OS-CAL) 600 MG TABS Take 1,200 mg by mouth daily with breakfast.     . Cholecalciferol (VITAMIN D) 2000 UNITS tablet Take 2,000 Units by mouth daily.     . cyclobenzaprine (FLEXERIL) 10 MG tablet Take 1 tablet (10 mg total) by mouth 3 (three) times daily as needed for muscle spasms. 30 tablet 2  . fluticasone (FLONASE) 50 MCG/ACT nasal spray Place 1 spray into both nostrils daily as needed for allergies.    . furosemide (LASIX) 20 MG tablet Take 2 tablets (40 mg total) by mouth daily. 180 tablet 3  . glucose blood (ONETOUCH VERIO) test strip 1 each by Other route daily as needed for other. 100 each 12  . losartan (COZAAR) 25 MG tablet Take 1 tablet (25 mg total) by mouth 2 (two) times daily. 60 tablet 6  . metFORMIN (GLUCOPHAGE) 500 MG tablet TAKE 1 TABLET (500 MG TOTAL) BY MOUTH 2 (TWO) TIMES DAILY WITH A MEAL. 180 tablet 1  . methocarbamol (ROBAXIN) 500 MG tablet Take 1 tablet by mouth as needed. For muscle spasms  3  . Multiple Vitamin  (MULTIVITAMIN) tablet Take 1 tablet by mouth daily.     . potassium chloride SA (K-DUR,KLOR-CON) 20 MEQ tablet Take 1 tablet (20 mEq total) by mouth daily. 180 tablet 3  . rivaroxaban (XARELTO) 20 MG TABS tablet Take 1 tablet (20 mg total) by mouth daily with supper. 30 tablet 5  . spironolactone (ALDACTONE) 25 MG tablet Take 1 tablet (25 mg total) by mouth daily. 90 tablet 1   No current facility-administered medications on file prior to visit.    BP  128/80 mmHg  Pulse 73  Temp(Src) 98.1 F (36.7 C) (Oral)  Resp 20  Ht 5' 5.5" (1.664 m)  Wt 236 lb (107.049 kg)  BMI 38.66 kg/m2  SpO2 97%    Review of Systems  Constitutional: Negative.   HENT: Negative for congestion, dental problem, hearing loss, rhinorrhea, sinus pressure, sore throat and tinnitus.   Eyes: Negative for pain, discharge and visual disturbance.  Respiratory: Negative for cough and shortness of breath.   Cardiovascular: Negative for chest pain, palpitations and leg swelling.  Gastrointestinal: Negative for nausea, vomiting, abdominal pain, diarrhea, constipation, blood in stool and abdominal distention.  Genitourinary: Negative for dysuria, urgency, frequency, hematuria, flank pain, vaginal bleeding, vaginal discharge, difficulty urinating, vaginal pain and pelvic pain.  Musculoskeletal: Negative for joint swelling, arthralgias and gait problem.  Skin: Negative for rash.  Neurological: Negative for dizziness, syncope, speech difficulty, weakness, numbness and headaches.  Hematological: Negative for adenopathy.  Psychiatric/Behavioral: Negative for behavioral problems, dysphoric mood and agitation. The patient is not nervous/anxious.        Objective:   Physical Exam  Constitutional: She is oriented to person, place, and time. She appears well-developed and well-nourished.  HENT:  Head: Normocephalic.  Right Ear: External ear normal.  Left Ear: External ear normal.  Mouth/Throat: Oropharynx is clear and moist.    Eyes: Conjunctivae and EOM are normal. Pupils are equal, round, and reactive to light.  Neck: Normal range of motion. Neck supple. No thyromegaly present.  Cardiovascular: Normal rate, regular rhythm, normal heart sounds and intact distal pulses.   Pulmonary/Chest: Effort normal and breath sounds normal.  Abdominal: Soft. Bowel sounds are normal. She exhibits no mass. There is no tenderness.  Musculoskeletal: Normal range of motion.  Lymphadenopathy:    She has no cervical adenopathy.  Neurological: She is alert and oriented to person, place, and time.  Skin: Skin is warm and dry. No rash noted.  Psychiatric: She has a normal mood and affect. Her behavior is normal.          Assessment & Plan:   Diabetes.  Appears to be a tightly controlled.  We'll discontinue glipizide and recheck hemoglobin A1c in 6 months Essential hypertension, stable History spinal stenosis and low back pain Dyslipidemia.  Continue statin therapy Paroxysmal atrial fibrillation.  Continue chronic anticoagulation  Recheck 6 months

## 2015-05-01 NOTE — Patient Instructions (Signed)
Discontinue glipizide  Limit your sodium (Salt) intake    It is important that you exercise regularly, at least 20 minutes 3 to 4 times per week.  If you develop chest pain or shortness of breath seek  medical attention.  Please see your eye doctor yearly to check for diabetic eye damage  Return in 6 months for follow-up

## 2015-05-01 NOTE — Telephone Encounter (Signed)
Caryl Pina from [employer] call to say that the pt participate in CHF program and she is asking if she can have the pts  last bp and heartrate   [phone]  Ext [phone]     Fax [phone]

## 2015-05-02 ENCOUNTER — Ambulatory Visit
Admission: RE | Admit: 2015-05-02 | Discharge: 2015-05-02 | Disposition: A | Discharge status: Home / Self Care | Payer: Medicare Other | Source: Ambulatory Visit

## 2015-05-02 DIAGNOSIS — Z1231 Encounter for screening mammogram for malignant neoplasm of breast: Secondary | ICD-10-CM

## 2015-05-02 NOTE — Telephone Encounter (Signed)
Last office noted with pt's blood pressure and heart rate faxed to [facility].

## 2015-05-05 ENCOUNTER — Encounter: Payer: Self-pay | Admitting: *Deleted

## 2015-05-05 ENCOUNTER — Other Ambulatory Visit: Payer: Self-pay | Admitting: Internal Medicine

## 2015-05-05 DIAGNOSIS — R928 Other abnormal and inconclusive findings on diagnostic imaging of breast: Secondary | ICD-10-CM

## 2015-05-07 DIAGNOSIS — E119 Type 2 diabetes mellitus without complications: Secondary | ICD-10-CM | POA: Diagnosis not present

## 2015-05-07 DIAGNOSIS — H40013 Open angle with borderline findings, low risk, bilateral: Secondary | ICD-10-CM | POA: Diagnosis not present

## 2015-05-07 DIAGNOSIS — Z961 Presence of intraocular lens: Secondary | ICD-10-CM | POA: Diagnosis not present

## 2015-05-07 LAB — HM DIABETES EYE EXAM

## 2015-05-13 ENCOUNTER — Telehealth: Payer: Self-pay | Admitting: Internal Medicine

## 2015-05-13 NOTE — Telephone Encounter (Signed)
Pt in chf program with [facility] and Caryl Pina would like most recent bp and heart rate ok to fax to [phone] or call.

## 2015-05-14 NOTE — Telephone Encounter (Signed)
Vitals from last office visit 05/01/2015 faxed to [facility] at CHF program.

## 2015-05-15 ENCOUNTER — Encounter: Payer: Self-pay | Admitting: Internal Medicine

## 2015-05-15 ENCOUNTER — Ambulatory Visit
Admission: RE | Admit: 2015-05-15 | Discharge: 2015-05-15 | Disposition: A | Discharge status: Home / Self Care | Payer: Medicare Other | Source: Ambulatory Visit | Attending: Internal Medicine | Admitting: Internal Medicine

## 2015-05-15 DIAGNOSIS — R921 Mammographic calcification found on diagnostic imaging of breast: Secondary | ICD-10-CM | POA: Diagnosis not present

## 2015-05-15 DIAGNOSIS — Z853 Personal history of malignant neoplasm of breast: Secondary | ICD-10-CM | POA: Diagnosis not present

## 2015-05-15 DIAGNOSIS — R928 Other abnormal and inconclusive findings on diagnostic imaging of breast: Secondary | ICD-10-CM

## 2015-05-19 ENCOUNTER — Encounter (HOSPITAL_BASED_OUTPATIENT_CLINIC_OR_DEPARTMENT_OTHER): Payer: Medicare Other

## 2015-05-29 ENCOUNTER — Inpatient Hospital Stay: Admission: RE | Admit: 2015-05-29 | Payer: Medicare Other | Source: Ambulatory Visit

## 2015-06-27 ENCOUNTER — Encounter: Payer: Self-pay | Admitting: Cardiology

## 2015-07-01 ENCOUNTER — Other Ambulatory Visit: Payer: Self-pay | Admitting: Cardiology

## 2015-07-09 DIAGNOSIS — B372 Candidiasis of skin and nail: Secondary | ICD-10-CM | POA: Diagnosis not present

## 2015-07-09 DIAGNOSIS — Z853 Personal history of malignant neoplasm of breast: Secondary | ICD-10-CM | POA: Diagnosis not present

## 2015-07-09 DIAGNOSIS — Z780 Asymptomatic menopausal state: Secondary | ICD-10-CM | POA: Diagnosis not present

## 2015-07-09 DIAGNOSIS — Z01419 Encounter for gynecological examination (general) (routine) without abnormal findings: Secondary | ICD-10-CM | POA: Diagnosis not present

## 2015-07-22 ENCOUNTER — Other Ambulatory Visit: Payer: Self-pay | Admitting: Internal Medicine

## 2015-07-26 ENCOUNTER — Other Ambulatory Visit: Payer: Self-pay | Admitting: Internal Medicine

## 2015-07-31 ENCOUNTER — Ambulatory Visit: Payer: Self-pay | Admitting: Cardiology

## 2015-08-13 NOTE — Progress Notes (Signed)
Cardiology Office Note   Date:  08/14/2015   ID:  Cynthia Herrera, [place] [birth year], MRN [id number]  PCP:  Nyoka Cowden, MD    Chief Complaint  Patient presents with  . Coronary Artery Disease  . Hypertension  . Congestive Heart Failure      History of Present Illness: Cynthia Herrera is a 69y.o. 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 She was again cardioverted on 1/21 after more loading with amio but failed to convert. Later that day she spontaneously converted to NSR. She stopped amio due to severe dizziness. She denies any chest pain, SOB, DOE, palpitations or syncope.Occasionally she will have some LE edema but very well controlled. Her dizziness resolved off amio.     Past Medical History  Diagnosis Date  . Personal history of colonic polyps     adenomas 03 and 08  . Hyperlipidemia     takes Lipitor daily  . Osteopenia   . Vitamin D deficiency     takes Vit D  . Allergy     seasonal  . Arthritis   . Breast cancer ([place])     left: surgery,chemo,radiation  . Atrial fibrillation ([place])     s/p TEE DCCV and repeat DCCV 11/14/2013  . DCM (dilated cardiomyopathy) ([place])     EF 15-20% ? tachycardia induced  . Hypertension     takes Losartan and Metoprolol.   Marland Kitchen Dysrhythmia     A fib-takes AMiodarone,Digoxin,and Xarelto daily  . Chronic systolic CHF (congestive heart failure) ([facility])     takes Furosemide and Aldactone daily  . COPD (chronic obstructive pulmonary disease) ([facility])     no inhalers   . History of bronchitis 15+yrs ago  . Joint pain   . Chronic back pain     lumbar stenosis  . Muscle spasm     takes Flexeril daily as needed   . GERD (gastroesophageal reflux disease)     once in a  while;depends on what she eats  . Nocturia   . Cataracts, bilateral     immature  . Diabetes mellitus without complication [facility])     recent dx    Past Surgical History  Procedure Laterality Date  . Cholecystectomy  2004  . Colonoscopy  2003, September 2008, April 01, 2011    adenomas 03 and 08, polyp 12, diverticulosis  . Joint replacement      totally replacement surgery  . Tubal ligation    . Breast lumpectomy  2002  . Port-a-cath removal    . Total knee arthroplasty  2006  . Tee without cardioversion N/A 11/12/2013    Procedure: TRANSESOPHAGEAL ECHOCARDIOGRAM (TEE);  Surgeon: Thayer Headings, MD;  Location: [place];  Service: Cardiovascular;  Laterality: N/A;  pt b/p low, pt buccal membranes very dry, lips scapped, pt c/o thirst. NPO since MN and iv FLUIDS TOTAL INFUSING AT TOTAL 20ML HR....  Dr. Cathie Olden order allow NS to bolus during procedure....very dry,NS bolus 250 ml total..pt responding well to meds..  . Cardioversion N/A 11/12/2013    Procedure: CARDIOVERSION;  Surgeon: Thayer Headings, MD;  Location: [place];  Service: Cardiovascular;  Laterality: N/A;  10:08  Dr. Marissa Nestle, anesthesia present,  Lido   60mg ,  propofol 50mg , IV for elective cardioversion....Dr. Cathie Olden delievered synch 120 joules with successful cardioversion to NSR  . Cardioversion N/A 11/12/2013    Procedure: CARDIOVERSION;  Surgeon: Thayer Headings, MD;  Location: [place];  Service: Cardiovascular;  Laterality: N/A;  . Cardioversion N/A 11/14/2013    Procedure: CARDIOVERSION (BEDSIDE);  Surgeon: Larey Dresser, MD;  Location: [place];  Service: Cardiovascular;  Laterality: N/A;  Shanda Howells a cath placed    . Mastectomy  July 2002    left  . Cardiac catheterization  2015  . Lumbar laminectomy/decompression microdiscectomy N/A 03/19/2014    Procedure: LUMBAR LAMINECTOMY/DECOMPRESSION MICRODISCECTOMY 4 LEVEL;  Surgeon: Kristeen Miss, MD;  Location: [place] NEURO ORS;  Service: Neurosurgery;  Laterality: N/A;   L1-2 L2-3 L3-4 L4-5 Laminectomy/Foraminotomy  . Left and right heart catheterization with coronary angiogram N/A 02/01/2014    Procedure: LEFT AND RIGHT HEART CATHETERIZATION WITH CORONARY ANGIOGRAM;  Surgeon: Sueanne Margarita, MD;  Location: [place] CATH LAB;  Service: Cardiovascular;  Laterality: N/A;     Current Outpatient Prescriptions  Medication Sig Dispense Refill  . atorvastatin (LIPITOR) 40 MG tablet Take 1 tablet (40 mg total) by mouth at bedtime. 90 tablet 3  . calcium carbonate (OS-CAL) 600 MG TABS Take 1,200 mg by mouth daily with breakfast.     . Cholecalciferol (VITAMIN D) 2000 UNITS tablet Take 2,000 Units by mouth daily.     . cyclobenzaprine (FLEXERIL) 10 MG tablet Take 1 tablet (10 mg total) by mouth 3 (three) times daily as needed for muscle spasms. 30 tablet 2  . fluticasone (FLONASE) 50 MCG/ACT nasal spray Place 1 spray into both nostrils daily as needed for allergies.    . furosemide (LASIX) 20 MG tablet Take 2 tablets (40 mg total) by mouth daily. 180 tablet 3  . glipiZIDE (GLUCOTROL XL) 2.5 MG 24 hr tablet TAKE 1 TABLET (2.5 MG TOTAL) BY MOUTH DAILY WITH BREAKFAST. 90 tablet 1  . glucose blood (ONETOUCH VERIO) test strip 1 each by Other route daily as needed for other. 100 each 12  . losartan (COZAAR) 25 MG tablet Take 1 tablet (25 mg total) by mouth 2 (two) times daily. 60 tablet 6  . metFORMIN (GLUCOPHAGE) 500 MG tablet TAKE 1 TABLET TWICE A DAY WITH A MEAL 180 tablet 1  . methocarbamol (ROBAXIN) 500 MG tablet Take 1 tablet by mouth as needed. For muscle spasms  3  . Multiple Vitamin (MULTIVITAMIN) tablet Take 1 tablet by mouth daily.     . potassium chloride SA (K-DUR,KLOR-CON) 20 MEQ tablet Take 1 tablet (20 mEq total) by mouth daily. 180 tablet 3  . spironolactone (ALDACTONE) 25 MG tablet Take 1 tablet (25 mg total) by mouth daily. 90 tablet 1  . XARELTO 20 MG TABS tablet TAKE 1 TABLET (20 MG TOTAL) BY MOUTH DAILY WITH SUPPER. 30 tablet 5   No current  facility-administered medications for this visit.    Allergies:   Codeine phosphate    Social History:  The patient  reports that she quit smoking about 13 years ago. She has never used smokeless tobacco. She reports that she does not drink alcohol or use illicit drugs.   Family History:  The patient's family history includes Cancer in her mother; Dementia in her sister; Diabetes in her brother, father, mother, and sister; Heart disease in her brother and father; Liver cancer in her mother.    ROS:  Please see the history of present illness.  Otherwise, review of systems are positive for none.   All other systems are reviewed and negative.    PHYSICAL EXAM: VS:  BP 116/70 mmHg  Pulse 67  Ht 5' 5.5" (1.664 m)  Wt 243 lb 12.8 oz (110.587 kg)  BMI 39.94 kg/m2  SpO2 98% , BMI Body mass index is 39.94 kg/(m^2). GEN: Well nourished, well developed, in no acute distress HEENT: normal Neck: no JVD, carotid bruits, or masses Cardiac: RRR; no murmurs, rubs, or gallops,no edema  Respiratory:  clear to auscultation bilaterally, normal work of breathing GI: soft, nontender, nondistended, + BS MS: no deformity or atrophy Skin: warm and dry, no rash Neuro:  Strength and sensation are intact Psych: euthymic mood, full affect   EKG:  EKG was ordered today and showed NSR with nonspecific T wave abnormaltiy    Recent Labs: 10/14/2014: Hemoglobin 11.9*; Platelets 327.0; TSH 0.58 11/18/2014: Pro B Natriuretic peptide (BNP) 58.0 11/28/2014: BUN 23; Creatinine, Ser 1.13; Potassium 3.7; Sodium 140 01/01/2015: ALT 22    Lipid Panel    Component Value Date/Time   CHOL 125 01/01/2015 1130   TRIG 127.0 01/01/2015 1130   HDL 39.00* 01/01/2015 1130   CHOLHDL 3 01/01/2015 1130   VLDL 25.4 01/01/2015 1130   LDLCALC 61 01/01/2015 1130      Wt Readings from Last 3 Encounters:  08/14/15 243 lb 12.8 oz (110.587 kg)  05/01/15 236 lb (107.049 kg)  03/12/15 236 lb 6.4 oz (107.23 kg)     ASSESSMENT  AND PLAN:  1. Nonobstructive ASCAD - not on ASA due to Xarelto 2. Mild LV dysfunction EF 45% by cath and 50-55% by echo - presumed tachycardia induced from prior afib with RVR - EF has improved after maintenance of NSR.  3. Chronic combined systolic/diastolic CHF - appears well compensated - continue ARB/lasix and Aldactone - BB and dig stopped due to bradycardia and fatigue 4. PAF maintaining NSR post DCCV - she feels better off Amio - continue Xarelto 5. HTN well controlled - continue Losartan/Aldactone   6. Dyslipidemia - LDL goal is <70 and her last LDL at 61 - recheck FLP and ALT         Current medicines are reviewed at length with the patient today.  The patient does not have concerns regarding medicines.  The following changes have been made:  no change  Labs/ tests ordered today: See above Assessment and Plan No orders of the defined types were placed in this encounter.     Disposition:   FU with me in 6 months  Signed, Sueanne Margarita, MD  08/14/2015 3:30 PM    [facility] [address], [place], [place]  [address] Phone: [phone]; Fax: [phone]

## 2015-08-14 ENCOUNTER — Encounter: Payer: Self-pay | Admitting: Cardiology

## 2015-08-14 ENCOUNTER — Ambulatory Visit (INDEPENDENT_AMBULATORY_CARE_PROVIDER_SITE_OTHER): Payer: Medicare Other | Admitting: Cardiology

## 2015-08-14 VITALS — BP 116/70 | HR 67 | Ht 65.5 in | Wt 243.8 lb

## 2015-08-14 DIAGNOSIS — I420 Dilated cardiomyopathy: Secondary | ICD-10-CM

## 2015-08-14 DIAGNOSIS — I5042 Chronic combined systolic (congestive) and diastolic (congestive) heart failure: Secondary | ICD-10-CM

## 2015-08-14 DIAGNOSIS — I2510 Atherosclerotic heart disease of native coronary artery without angina pectoris: Secondary | ICD-10-CM | POA: Diagnosis not present

## 2015-08-14 DIAGNOSIS — I480 Paroxysmal atrial fibrillation: Secondary | ICD-10-CM

## 2015-08-14 DIAGNOSIS — I10 Essential (primary) hypertension: Secondary | ICD-10-CM

## 2015-08-14 DIAGNOSIS — Z23 Encounter for immunization: Secondary | ICD-10-CM

## 2015-08-14 DIAGNOSIS — E785 Hyperlipidemia, unspecified: Secondary | ICD-10-CM

## 2015-08-14 NOTE — Patient Instructions (Signed)
Medication Instructions:  Your physician recommends that you continue on your current medications as directed. Please refer to the Current Medication list given to you today.   Labwork: Your physician recommends that you return for FASTING lab work.  Testing/Procedures: None  Follow-Up: Your physician wants you to follow-up in: 6 months with Dr. Turner. You will receive a reminder letter in the mail two months in advance. If you don't receive a letter, please call our office to schedule the follow-up appointment.   Any Other Special Instructions Will Be Listed Below (If Applicable).   

## 2015-08-19 ENCOUNTER — Other Ambulatory Visit (INDEPENDENT_AMBULATORY_CARE_PROVIDER_SITE_OTHER): Payer: Medicare Other | Admitting: *Deleted

## 2015-08-19 DIAGNOSIS — I480 Paroxysmal atrial fibrillation: Secondary | ICD-10-CM | POA: Diagnosis not present

## 2015-08-19 DIAGNOSIS — E785 Hyperlipidemia, unspecified: Secondary | ICD-10-CM

## 2015-08-19 DIAGNOSIS — I2510 Atherosclerotic heart disease of native coronary artery without angina pectoris: Secondary | ICD-10-CM | POA: Diagnosis not present

## 2015-08-19 DIAGNOSIS — I10 Essential (primary) hypertension: Secondary | ICD-10-CM | POA: Diagnosis not present

## 2015-08-19 LAB — LIPID PANEL
Cholesterol: 122 mg/dL — ABNORMAL LOW (ref 125–200)
HDL: 44 mg/dL — AB (ref >=46)
LDL CALC: 57 mg/dL (ref <130)
TRIGLYCERIDES: 107 mg/dL (ref <150)
Total CHOL/HDL Ratio: 2.8 Ratio (ref <=5.0)
VLDL: 21 mg/dL (ref <30)

## 2015-08-19 LAB — BASIC METABOLIC PANEL
BUN: 20 mg/dL (ref 7–25)
CHLORIDE: 103 mmol/L (ref 98–110)
CO2: 24 mmol/L (ref 20–31)
CREATININE: 1.06 mg/dL — AB (ref 0.50–0.99)
Calcium: 9.7 mg/dL (ref 8.6–10.4)
GLUCOSE: 118 mg/dL — AB (ref 65–99)
POTASSIUM: 4 mmol/L (ref 3.5–5.3)
Sodium: 138 mmol/L (ref 135–146)

## 2015-08-19 LAB — HEPATIC FUNCTION PANEL
ALBUMIN: 4.1 g/dL (ref 3.6–5.1)
ALK PHOS: 115 U/L (ref 33–130)
ALT: 17 U/L (ref 6–29)
AST: 20 U/L (ref 10–35)
Bilirubin, Direct: 0.2 mg/dL (ref <=0.2)
Indirect Bilirubin: 0.6 mg/dL (ref 0.2–1.2)
TOTAL PROTEIN: 7.1 g/dL (ref 6.1–8.1)
Total Bilirubin: 0.8 mg/dL (ref 0.2–1.2)

## 2015-08-19 NOTE — Addendum Note (Signed)
Addended by: Eulis Foster on: 08/19/2015 12:44 PM   Modules accepted: Orders

## 2015-09-05 ENCOUNTER — Other Ambulatory Visit: Payer: Self-pay

## 2015-09-05 ENCOUNTER — Telehealth: Payer: Self-pay | Admitting: Internal Medicine

## 2015-09-05 MED ORDER — LOSARTAN POTASSIUM 25 MG PO TABS: 25.0000 mg | ORAL_TABLET | Freq: Two times a day (BID) | ORAL | Status: DC

## 2015-09-05 NOTE — Telephone Encounter (Signed)
Pt is aware.  

## 2015-09-05 NOTE — Telephone Encounter (Signed)
Please tell pt she already had Prevnar 13 vaccine on 10/23/2013. She does not need anymore pneumonia shots. Thanks

## 2015-09-05 NOTE — Telephone Encounter (Signed)
Pt wants to know if she is a candidate for the prevnar 13 vaccine. Pt knows nothing about it

## 2015-09-28 ENCOUNTER — Other Ambulatory Visit: Payer: Self-pay | Admitting: Cardiology

## 2015-10-09 ENCOUNTER — Other Ambulatory Visit: Payer: Self-pay | Admitting: Cardiology

## 2015-10-26 LAB — HM MAMMOGRAPHY

## 2015-11-03 ENCOUNTER — Other Ambulatory Visit: Payer: Medicare Other

## 2015-11-06 ENCOUNTER — Other Ambulatory Visit (INDEPENDENT_AMBULATORY_CARE_PROVIDER_SITE_OTHER): Payer: Medicare Other

## 2015-11-06 DIAGNOSIS — Z0000 Encounter for general adult medical examination without abnormal findings: Secondary | ICD-10-CM

## 2015-11-06 LAB — HEPATIC FUNCTION PANEL
ALBUMIN: 4.4 g/dL (ref 3.5–5.2)
ALT: 15 U/L (ref 0–35)
AST: 17 U/L (ref 0–37)
Alkaline Phosphatase: 126 U/L — ABNORMAL HIGH (ref 39–117)
BILIRUBIN TOTAL: 0.8 mg/dL (ref 0.2–1.2)
Bilirubin, Direct: 0.2 mg/dL (ref 0.0–0.3)
Total Protein: 6.7 g/dL (ref 6.0–8.3)

## 2015-11-06 LAB — LIPID PANEL
CHOLESTEROL: 129 mg/dL (ref 0–200)
HDL: 40.9 mg/dL (ref >39.00)
LDL Cholesterol: 65 mg/dL (ref 0–99)
NonHDL: 88.34
TRIGLYCERIDES: 116 mg/dL (ref 0.0–149.0)
Total CHOL/HDL Ratio: 3
VLDL: 23.2 mg/dL (ref 0.0–40.0)

## 2015-11-06 LAB — CBC WITH DIFFERENTIAL/PLATELET
Basophils Absolute: 0.1 10*3/uL (ref 0.0–0.1)
Basophils Relative: 0.9 % (ref 0.0–3.0)
EOS PCT: 5.8 % — AB (ref 0.0–5.0)
Eosinophils Absolute: 0.4 10*3/uL (ref 0.0–0.7)
HCT: 38.7 % (ref 36.0–46.0)
Hemoglobin: 12.7 g/dL (ref 12.0–15.0)
LYMPHS ABS: 1.2 10*3/uL (ref 0.7–4.0)
Lymphocytes Relative: 19.7 % (ref 12.0–46.0)
MCHC: 32.9 g/dL (ref 30.0–36.0)
MCV: 88.5 fl (ref 78.0–100.0)
MONO ABS: 0.7 10*3/uL (ref 0.1–1.0)
MONOS PCT: 11.5 % (ref 3.0–12.0)
NEUTROS ABS: 3.8 10*3/uL (ref 1.4–7.7)
NEUTROS PCT: 62.1 % (ref 43.0–77.0)
PLATELETS: 317 10*3/uL (ref 150.0–400.0)
RBC: 4.37 Mil/uL (ref 3.87–5.11)
RDW: 14.9 % (ref 11.5–15.5)
WBC: 6.1 10*3/uL (ref 4.0–10.5)

## 2015-11-06 LAB — BASIC METABOLIC PANEL
BUN: 21 mg/dL (ref 6–23)
CALCIUM: 10.1 mg/dL (ref 8.4–10.5)
CO2: 30 meq/L (ref 19–32)
Chloride: 104 mEq/L (ref 96–112)
Creatinine, Ser: 0.96 mg/dL (ref 0.40–1.20)
GFR: 61.05 mL/min (ref >60.00)
GLUCOSE: 109 mg/dL — AB (ref 70–99)
Potassium: 4.6 mEq/L (ref 3.5–5.1)
SODIUM: 141 meq/L (ref 135–145)

## 2015-11-06 LAB — POCT URINALYSIS DIPSTICK
Bilirubin, UA: NEGATIVE
Glucose, UA: NEGATIVE
Ketones, UA: NEGATIVE
NITRITE UA: NEGATIVE
PH UA: 6
PROTEIN UA: NEGATIVE
Spec Grav, UA: 1.02
UROBILINOGEN UA: 0.2

## 2015-11-06 LAB — MICROALBUMIN / CREATININE URINE RATIO
CREATININE, U: 150 mg/dL
MICROALB UR: 0.7 mg/dL (ref 0.0–1.9)
MICROALB/CREAT RATIO: 0.5 mg/g (ref 0.0–30.0)

## 2015-11-06 LAB — TSH: TSH: 0.76 u[IU]/mL (ref 0.35–4.50)

## 2015-11-06 LAB — HEMOGLOBIN A1C: Hgb A1c MFr Bld: 5.4 % (ref 4.6–6.5)

## 2015-11-10 ENCOUNTER — Encounter: Payer: Self-pay | Admitting: Internal Medicine

## 2015-11-10 ENCOUNTER — Ambulatory Visit (INDEPENDENT_AMBULATORY_CARE_PROVIDER_SITE_OTHER): Payer: Medicare Other | Admitting: Internal Medicine

## 2015-11-10 VITALS — BP 120/66 | HR 90 | Temp 98.5°F | Resp 20 | Ht 65.5 in | Wt 252.0 lb

## 2015-11-10 DIAGNOSIS — E1151 Type 2 diabetes mellitus with diabetic peripheral angiopathy without gangrene: Secondary | ICD-10-CM

## 2015-11-10 DIAGNOSIS — I10 Essential (primary) hypertension: Secondary | ICD-10-CM

## 2015-11-10 DIAGNOSIS — I70209 Unspecified atherosclerosis of native arteries of extremities, unspecified extremity: Secondary | ICD-10-CM

## 2015-11-10 DIAGNOSIS — Z0000 Encounter for general adult medical examination without abnormal findings: Secondary | ICD-10-CM

## 2015-11-10 DIAGNOSIS — E785 Hyperlipidemia, unspecified: Secondary | ICD-10-CM

## 2015-11-10 DIAGNOSIS — Z7901 Long term (current) use of anticoagulants: Secondary | ICD-10-CM

## 2015-11-10 DIAGNOSIS — Z86010 Personal history of colonic polyps: Secondary | ICD-10-CM

## 2015-11-10 DIAGNOSIS — Z853 Personal history of malignant neoplasm of breast: Secondary | ICD-10-CM

## 2015-11-10 DIAGNOSIS — E1159 Type 2 diabetes mellitus with other circulatory complications: Secondary | ICD-10-CM

## 2015-11-10 NOTE — Patient Instructions (Signed)
Limit your sodium (Salt) intake   Please check your hemoglobin A1c every 3-6  Months  Please check your blood pressure on a regular basis.  If it is consistently greater than 150/90, please make an office appointment.  You need to lose weight.  Consider a lower calorie diet and regular exercise.  Return in 6 months for follow-up

## 2015-11-10 NOTE — Progress Notes (Signed)
Pre visit review using our clinic review tool, if applicable. No additional management support is needed unless otherwise documented below in the visit note. 

## 2015-11-10 NOTE — Progress Notes (Signed)
Patient ID: Cynthia Herrera, female   DOB: 1945-12-13, 70 y.o.   MRN: [id number]  Subjective:    Patient ID: Cynthia Herrera, female    DOB: 1945-12-06, 70 y.o.   MRN: [id number]  Hypertension Pertinent negatives include no chest pain, headaches, palpitations or shortness of breath.  Hyperlipidemia Pertinent negatives include no chest pain or shortness of breath.  89 -year-old patient who is seen today for a preventive health examination.   She has a history of hypertension and dyslipidemia which has been stable. She has been followed by Dr. Ubaldo Glassing and does have a history of left breast cancer treated 14  years ago. She has a history also of colonic polyps and had a followup colonoscopy. She receives annual mammograms and gynecologic evaluations. She has a history of mild COPD and osteopenia. She no longer smokes. She did have a stress test performed approximately 5 years ago.  She is followed by cardiology with a   history of ASCAD (coronary artery calcifications on CT scan), Dilated CM EF 20%, HTN, dyslipidemia, chronic anticoagulation who was admitted with acute CHF. She was also in atrial fibrillation with RVR. She was diuresed and ruled out for MI. Echo showed EF 15-20%. She was started on Amiodarone and on 11/12/2013 underwent TEE/DCCV to NSR but failed to hold NSR. She was again cardioverted  after more loading with amio but failed to convert. Later that day she spontaneously converted to NSR. Her LV dysfunction persisted after maintenance of NSR so she underwent cardiac cath which showed calcified coronary arteries with no obstructive lesions and mildly reduced LVF at 45% and normal right heart pressures.   Past Medical History  Diagnosis Date  . Personal history of colonic polyps     adenomas 03 and 08  . Hyperlipidemia     takes Lipitor daily  . Osteopenia   . Vitamin D deficiency     takes Vit D  . Allergy     seasonal  . Arthritis   . Breast cancer ([place])     left:  surgery,chemo,radiation  . Atrial fibrillation ([place])     s/p TEE DCCV and repeat DCCV 11/14/2013  . DCM (dilated cardiomyopathy) ([place])     EF 15-20% ? tachycardia induced  . Hypertension     takes Losartan and Metoprolol.   Marland Kitchen Dysrhythmia     A fib-takes AMiodarone,Digoxin,and Xarelto daily  . Chronic systolic CHF (congestive heart failure) (HCC)     takes Furosemide and Aldactone daily  . COPD (chronic obstructive pulmonary disease) (HCC)     no inhalers   . History of bronchitis 15+yrs ago  . Joint pain   . Chronic back pain     lumbar stenosis  . Muscle spasm     takes Flexeril daily as needed   . GERD (gastroesophageal reflux disease)     once in a while;depends on what she eats  . Nocturia   . Cataracts, bilateral     immature  . Diabetes mellitus without complication [facility])     recent dx   Past Surgical History  Procedure Laterality Date  . Cholecystectomy  2004  . Colonoscopy  2003, September 2008, April 01, 2011    adenomas 03 and 08, polyp 12, diverticulosis  . Joint replacement      totally replacement surgery  . Tubal ligation    . Breast lumpectomy  2002  . Port-a-cath removal    . Total knee arthroplasty  2006  . Tee without cardioversion N/A  11/12/2013    Procedure: TRANSESOPHAGEAL ECHOCARDIOGRAM (TEE);  Surgeon: Thayer Headings, MD;  Location: [place];  Service: Cardiovascular;  Laterality: N/A;  pt b/p low, pt buccal membranes very dry, lips scapped, pt c/o thirst. NPO since MN and iv FLUIDS TOTAL INFUSING AT TOTAL 20ML HR....  Dr. Cathie Olden order allow NS to bolus during procedure....very dry,NS bolus 250 ml total..pt responding well to meds..  . Cardioversion N/A 11/12/2013    Procedure: CARDIOVERSION;  Surgeon: Thayer Headings, MD;  Location: [facility] ENDOSCOPY;  Service: Cardiovascular;  Laterality: N/A;  10:08  Dr. Marissa Nestle, anesthesia present, Lido   60mg ,  propofol 50mg , IV for elective cardioversion....Dr. Cathie Olden delievered synch 120 joules with successful  cardioversion to NSR  . Cardioversion N/A 11/12/2013    Procedure: CARDIOVERSION;  Surgeon: Thayer Headings, MD;  Location: [place];  Service: Cardiovascular;  Laterality: N/A;  . Cardioversion N/A 11/14/2013    Procedure: CARDIOVERSION (BEDSIDE);  Surgeon: Larey Dresser, MD;  Location: [place];  Service: Cardiovascular;  Laterality: N/A;  Shanda Howells a cath placed    . Mastectomy  July 2002    left  . Cardiac catheterization  2015  . Lumbar laminectomy/decompression microdiscectomy N/A 03/19/2014    Procedure: LUMBAR LAMINECTOMY/DECOMPRESSION MICRODISCECTOMY 4 LEVEL;  Surgeon: Kristeen Miss, MD;  Location: [place] NEURO ORS;  Service: Neurosurgery;  Laterality: N/A;  L1-2 L2-3 L3-4 L4-5 Laminectomy/Foraminotomy  . Left and right heart catheterization with coronary angiogram N/A 02/01/2014    Procedure: LEFT AND RIGHT HEART CATHETERIZATION WITH CORONARY ANGIOGRAM;  Surgeon: Sueanne Margarita, MD;  Location: [place] CATH LAB;  Service: Cardiovascular;  Laterality: N/A;    reports that she quit smoking about 14 years ago. She has never used smokeless tobacco. She reports that she does not drink alcohol or use illicit drugs. family history includes Cancer in her mother; Dementia in her sister; Diabetes in her brother, father, mother, and sister; Heart disease in her brother and father; Liver cancer in her mother. Allergies  Allergen Reactions  . Codeine Phosphate Other (See Comments)    Weird feeling "   Family History:  Reviewed history from 05/29/2009 and no changes required.  Family History Diabetes 1st degree relative  Fam hx MI  Family History Other cancer-Father died  at 10 of an MI; mother deceased from pancreatic cancer. Two brothers; one deceased. One status post CABG; one sister.- DM and dementia; History DVT. Both parents. Both brothers and one sister with diabetes;   Social History:  Reviewed history from 05/24/2008 and no changes required.  Former Smoker  Married  presently  unemployed  [employer]  1. Risk factors, based on past  M,S,F history-  cardiovascular risk factors include hypertension and dyslipidemia.  She has a recent diagnosis of diabetes mellitus type 2, which has been well controlled.  She has nonobstructive coronary artery disease with mildly impaired systolic function  2.  Physical activities: No exercise limitations does have arthritis and exogenous obesity; history of DOE  3.  Depression/mood: No history of depression or mood disorder  4.  Hearing: No deficits  5.  ADL's: Independent in all aspects of daily living  6.  Fall risk: Low  7.  Home safety: No problems identified  8.  Height weight, and visual acuity; height and weight stable no change in visual acuity does have annual eye examinations  9.  Counseling: Weight loss exercise vitamin D supplementation all encouraged  10. Lab orders based on risk factors: Laboratory profile reviewed lipid  panel well controlled  11. Referral: Followup ophthalmology GI and GYN  12. Care plan: Weight loss exercise low salt diet all encouraged  .  Needs follow-up cardiology.  2017  13. Cognitive assessment: Alert and oriented with normal affect no cognitive dysfunction  14.  Preventive services will include annual health assessments.  She'll require quarterly monitoring of hemoglobin A1c's. V Patient was provided with a written and personalized care plan  15.  Provider list updated.  Includes cardiology GYN primary care medicine and GI        Review of Systems  Constitutional: Negative for fever, appetite change, fatigue and unexpected weight change.  HENT: Positive for congestion. Negative for dental problem, ear pain, hearing loss, mouth sores, nosebleeds, sinus pressure, sore throat, tinnitus, trouble swallowing and voice change.   Eyes: Negative for photophobia, pain, redness and visual disturbance.  Respiratory: Negative for cough, chest tightness and shortness of breath.    Cardiovascular: Negative for chest pain, palpitations and leg swelling.  Gastrointestinal: Negative for nausea, vomiting, abdominal pain, diarrhea, constipation, blood in stool, abdominal distention and rectal pain.  Genitourinary: Negative for dysuria, urgency, frequency, hematuria, flank pain, vaginal bleeding, vaginal discharge, difficulty urinating, genital sores, vaginal pain, menstrual problem and pelvic pain.  Musculoskeletal: Positive for back pain and arthralgias. Negative for neck stiffness.  Skin: Negative for rash.  Neurological: Positive for dizziness and light-headedness. Negative for syncope, speech difficulty, weakness, numbness and headaches.  Hematological: Negative for adenopathy. Does not bruise/bleed easily.  Psychiatric/Behavioral: Negative for suicidal ideas, behavioral problems, self-injury, dysphoric mood and agitation. The patient is not nervous/anxious.        Objective:   Physical Exam  Constitutional: She is oriented to person, place, and time. She appears well-developed and well-nourished.  HENT:  Head: Normocephalic and atraumatic.  Right Ear: External ear normal.  Left Ear: External ear normal.  Mouth/Throat: Oropharynx is clear and moist.  Eyes: Conjunctivae and EOM are normal.  Neck: Normal range of motion. Neck supple. No JVD present. No thyromegaly present.  Cardiovascular: Normal rate, regular rhythm, normal heart sounds and intact distal pulses.   No murmur heard. Pulmonary/Chest: Effort normal and breath sounds normal. She has no wheezes. She has no rales.  Status post left lumpectomy with left axillary node dissection  Scar from prior Port-A-Cath, right upper anterior chest  Abdominal: Soft. Bowel sounds are normal. She exhibits no distension and no mass. There is no tenderness. There is no rebound and no guarding.  Musculoskeletal: Normal range of motion. She exhibits no edema or tenderness.  Neurological: She is alert and oriented to person,  place, and time. She has normal reflexes. No cranial nerve deficit. She exhibits normal muscle tone. Coordination normal.  Skin: Skin is warm and dry. No rash noted.  Plantar feet very dry and scaly  Psychiatric: She has a normal mood and affect. Her behavior is normal.          Assessment & Plan:   Preventive Health exam  HTN Dyslipidemia History colonic polyps Remote left breast cancer   Nonobstructive coronary artery disease Paroxysmal atrial fibrillation  LV dysfunction Diabetes mellitus type 2.  Well-controlled on present therapy.  Hemoglobin A1c is now in a nondiabetic range.  We'll consider discontinuation of sulfonylurea therapy  Weight loss encouraged We'll regular exercise recommended.  Although limited with back pain

## 2015-11-18 DIAGNOSIS — H40013 Open angle with borderline findings, low risk, bilateral: Secondary | ICD-10-CM | POA: Diagnosis not present

## 2015-12-01 ENCOUNTER — Other Ambulatory Visit: Payer: Self-pay | Admitting: Cardiology

## 2015-12-05 DIAGNOSIS — M1711 Unilateral primary osteoarthritis, right knee: Secondary | ICD-10-CM | POA: Diagnosis not present

## 2015-12-15 ENCOUNTER — Other Ambulatory Visit: Payer: Self-pay | Admitting: Cardiology

## 2015-12-16 ENCOUNTER — Telehealth: Payer: Self-pay | Admitting: Internal Medicine

## 2015-12-16 NOTE — Telephone Encounter (Signed)
[employer] called to schedule a kidney function test.  [employer] states they will pay. Advised I could not schedule without an order.  Advised we will call pt when I have an order.

## 2015-12-16 NOTE — Telephone Encounter (Signed)
Pt already had test done in Nov 06, 2015 and it was normal.

## 2015-12-17 ENCOUNTER — Other Ambulatory Visit: Payer: Self-pay | Admitting: Cardiology

## 2016-01-22 DIAGNOSIS — M1711 Unilateral primary osteoarthritis, right knee: Secondary | ICD-10-CM | POA: Diagnosis not present

## 2016-01-25 ENCOUNTER — Other Ambulatory Visit: Payer: Self-pay | Admitting: Cardiology

## 2016-01-27 ENCOUNTER — Telehealth: Payer: Self-pay | Admitting: Cardiology

## 2016-01-27 NOTE — Telephone Encounter (Signed)
Patient called to report she received a letter from [facility] stating she should be on a beta-blocker. Informed patient she has been on Toprol before and she became symptomatic, so it was d/c'd.  She also c/o bilateral foot swelling. She st her L leg is more swollen than the right. It is not painful or red, just very tight and her shoes are starting to squeeze her feet. For swelling, instructed patient to limit salt and to elevate legs during the day. She is currently only elevating her R leg (she is getting a knee replacement in July).  She c/o chest "fullness" that feels like lung congestion. She st she is going to the store to get OTC medicine. Instructed patient to try the medicine and callback if symptoms worsen or chest starts to hurt.  She understands she will be called with further instructions from Dr. Radford Pax.

## 2016-01-27 NOTE — Telephone Encounter (Signed)
Cynthia Herrera is calling because she is having some heaviness in her chest and is wanting to speak to someone .

## 2016-01-28 NOTE — Telephone Encounter (Signed)
I would like her to see her PCP today 

## 2016-01-28 NOTE — Telephone Encounter (Signed)
Left message for patient to call PCP and schedule ASAP appointment if she has not already. Instructed her to call back with further questions or concerns.

## 2016-02-03 ENCOUNTER — Other Ambulatory Visit: Payer: Self-pay | Admitting: Internal Medicine

## 2016-02-12 ENCOUNTER — Encounter: Payer: Self-pay | Admitting: Cardiology

## 2016-02-12 ENCOUNTER — Ambulatory Visit (INDEPENDENT_AMBULATORY_CARE_PROVIDER_SITE_OTHER): Payer: Medicare Other | Admitting: Cardiology

## 2016-02-12 VITALS — BP 120/66 | HR 101 | Ht 66.0 in | Wt 250.8 lb

## 2016-02-12 DIAGNOSIS — I2510 Atherosclerotic heart disease of native coronary artery without angina pectoris: Secondary | ICD-10-CM

## 2016-02-12 DIAGNOSIS — I420 Dilated cardiomyopathy: Secondary | ICD-10-CM

## 2016-02-12 DIAGNOSIS — I10 Essential (primary) hypertension: Secondary | ICD-10-CM

## 2016-02-12 DIAGNOSIS — I5042 Chronic combined systolic (congestive) and diastolic (congestive) heart failure: Secondary | ICD-10-CM | POA: Diagnosis not present

## 2016-02-12 DIAGNOSIS — E785 Hyperlipidemia, unspecified: Secondary | ICD-10-CM

## 2016-02-12 DIAGNOSIS — I480 Paroxysmal atrial fibrillation: Secondary | ICD-10-CM

## 2016-02-12 MED ORDER — METOPROLOL SUCCINATE ER 25 MG PO TB24: 25.0000 mg | ORAL_TABLET | Freq: Every day | ORAL | Status: DC

## 2016-02-12 NOTE — Progress Notes (Signed)
Cardiology Office Note    Date:  02/12/2016   ID:  Cynthia, Herrera January 22, 1946, MRN [id number]  PCP:  Cynthia Cowden, MD  Cardiologist:  Cynthia Margarita, MD   Chief Complaint  Patient presents with  . Coronary Artery Disease  . Cardiomyopathy  . Congestive Heart Failure    History of Present Illness:  Cynthia Herrera is a 70 y.o. female with a history of ASCAD (coronary artery calcifications on CT scan and on cath with no obstructive lesions), nonischemic dilated CM EF now 50-55%, HTN, dyslipidemia, chronic anticoagulation, chronic systolic CHF and atrial fibrillation s/p TEE/DCCV to NSR but failed to hold NSR. She was again cardioverted on 1/21 after more loading with amio but failed to convert. Later that day she spontaneously converted to NSR. She stopped amio due to severe dizziness. She denies any chest pain, palpitations, dizziness or syncope.Occasionally she will have some LE edema but very well controlled. Her dizziness resolved off amio. She has had some sinus congestion but that resolved with antihistamines.  She occasionally has some DOE which she attributes to weight gain since she has become sedentary due to knee pain.  She is going to have a TKR in July.     Past Medical History  Diagnosis Date  . Personal history of colonic polyps     adenomas 03 and 08  . Hyperlipidemia   . Osteopenia   . Vitamin D deficiency     takes Vit D  . Allergy     seasonal  . Arthritis   . Breast cancer ([place])     left: surgery,chemo,radiation  . Atrial fibrillation ([place])     s/p TEE DCCV and repeat DCCV 11/14/2013  . DCM (dilated cardiomyopathy) ([place])     EF 15-20% ? tachycardia induced - EF 50-55% on echo 2015  . Hypertension     takes Losartan and Metoprolol.   . Chronic systolic CHF (congestive heart failure) (HCC)     takes Furosemide and Aldactone daily  . COPD (chronic obstructive pulmonary disease) (HCC)     no inhalers   . History of bronchitis 15+yrs  ago  . Joint pain   . Chronic back pain     lumbar stenosis  . Muscle spasm     takes Flexeril daily as needed   . GERD (gastroesophageal reflux disease)     once in a while;depends on what she eats  . Nocturia   . Cataracts, bilateral     immature  . Diabetes mellitus without complication [facility])     recent dx    Past Surgical History  Procedure Laterality Date  . Cholecystectomy  2004  . Colonoscopy  2003, September 2008, April 01, 2011    adenomas 03 and 08, polyp 12, diverticulosis  . Joint replacement      totally replacement surgery  . Tubal ligation    . Breast lumpectomy  2002  . Port-a-cath removal    . Total knee arthroplasty  2006  . Tee without cardioversion N/A 11/12/2013    Procedure: TRANSESOPHAGEAL ECHOCARDIOGRAM (TEE);  Surgeon: Cynthia Headings, MD;  Location: [place];  Service: Cardiovascular;  Laterality: N/A;  pt b/p low, pt buccal membranes very dry, lips scapped, pt c/o thirst. NPO since MN and iv FLUIDS TOTAL INFUSING AT TOTAL 20ML HR....  Dr. Cathie Olden order allow NS to bolus during procedure....very dry,NS bolus 250 ml total..pt responding well to meds..  . Cardioversion N/A 11/12/2013    Procedure: CARDIOVERSION;  Surgeon:  Cynthia Headings, MD;  Location: [place];  Service: Cardiovascular;  Laterality: N/A;  10:08  Dr. Marissa Herrera, anesthesia present, Lido   60mg ,  propofol 50mg , IV for elective cardioversion....Dr. Cathie Olden delievered synch 120 joules with successful cardioversion to NSR  . Cardioversion N/A 11/12/2013    Procedure: CARDIOVERSION;  Surgeon: Cynthia Headings, MD;  Location: [place];  Service: Cardiovascular;  Laterality: N/A;  . Cardioversion N/A 11/14/2013    Procedure: CARDIOVERSION (BEDSIDE);  Surgeon: Cynthia Dresser, MD;  Location: [place];  Service: Cardiovascular;  Laterality: N/A;  Shanda Howells a cath placed    . Mastectomy  July 2002    left  . Cardiac catheterization  2015  . Lumbar laminectomy/decompression microdiscectomy N/A  03/19/2014    Procedure: LUMBAR LAMINECTOMY/DECOMPRESSION MICRODISCECTOMY 4 LEVEL;  Surgeon: Cynthia Miss, MD;  Location: [place] NEURO ORS;  Service: Neurosurgery;  Laterality: N/A;  L1-2 L2-3 L3-4 L4-5 Laminectomy/Foraminotomy  . Left and right heart catheterization with coronary angiogram N/A 02/01/2014    Procedure: LEFT AND RIGHT HEART CATHETERIZATION WITH CORONARY ANGIOGRAM;  Surgeon: Cynthia Margarita, MD;  Location: [place] CATH LAB;  Service: Cardiovascular;  Laterality: N/A;    Current Medications: Outpatient Prescriptions Prior to Visit  Medication Sig Dispense Refill  . atorvastatin (LIPITOR) 40 MG tablet Take 1 tablet (40 mg total) by mouth at bedtime. 90 tablet 3  . Cholecalciferol (VITAMIN D) 2000 UNITS tablet Take 2,000 Units by mouth daily.     . fluticasone (FLONASE) 50 MCG/ACT nasal spray Place 1 spray into both nostrils daily as needed for allergies.    . furosemide (LASIX) 20 MG tablet TAKE 2 TABLETS (40 MG TOTAL) BY MOUTH DAILY. 180 tablet 3  . glucose blood (ONETOUCH VERIO) test strip 1 each by Other route daily as needed for other. 100 each 12  . KLOR-CON M20 20 MEQ tablet TAKE 1 TABLET (20 MEQ TOTAL) BY MOUTH DAILY. 180 tablet 3  . losartan (COZAAR) 25 MG tablet TAKE 1 TABLET TWICE A DAY 60 tablet 6  . metFORMIN (GLUCOPHAGE) 500 MG tablet TAKE 1 TABLET TWICE A DAY WITH A MEAL 180 tablet 1  . Multiple Vitamin (MULTIVITAMIN) tablet Take 1 tablet by mouth daily.     Marland Kitchen spironolactone (ALDACTONE) 25 MG tablet TAKE 1 TABLET (25 MG TOTAL) BY MOUTH DAILY. 90 tablet 1  . XARELTO 20 MG TABS tablet TAKE 1 TABLET (20 MG TOTAL) BY MOUTH DAILY WITH SUPPER. 30 tablet 6  . calcium carbonate (OS-CAL) 600 MG TABS Take 1,200 mg by mouth daily with breakfast.     . glipiZIDE (GLUCOTROL XL) 2.5 MG 24 hr tablet TAKE 1 TABLET (2.5 MG TOTAL) BY MOUTH DAILY WITH BREAKFAST. 90 tablet 1  . methocarbamol (ROBAXIN) 500 MG tablet Take 1 tablet by mouth as needed. For muscle spasms  3   No facility-administered  medications prior to visit.     Allergies:   Codeine phosphate   Social History   Social History  . Marital Status: Widowed    Spouse Name: N/A  . Number of Children: N/A  . Years of Education: N/A   Social History Main Topics  . Smoking status: Former Smoker    Quit date: 08/24/2001  . Smokeless tobacco: Never Used  . Alcohol Use: No  . Drug Use: No  . Sexual Activity: Not Asked   Other Topics Concern  . None   Social History Narrative     Family History:  The patient's family history includes Cancer in her  mother; Dementia in her sister; Diabetes in her brother, father, mother, and sister; Heart disease in her brother and father; Liver cancer in her mother.   ROS:   Please see the history of present illness.    Review of Systems  Constitution: Negative.  HENT: Negative.   Eyes: Negative.   Cardiovascular: Negative.   Respiratory: Negative.   Skin: Negative.   Musculoskeletal: Positive for joint swelling.  Gastrointestinal: Negative.   Genitourinary: Negative.   Neurological: Positive for loss of balance.  Psychiatric/Behavioral: Negative.    All other systems reviewed and are negative.   PHYSICAL EXAM:   VS:  BP 120/66 mmHg  Pulse 101  Ht 5\' 6"  (1.676 m)  Wt 250 lb 12.8 oz (113.762 kg)  BMI 40.50 kg/m2   GEN: Well nourished, well developed, in no acute distress HEENT: normal Neck: no JVD, carotid bruits, or masses Cardiac: RRR; no murmurs, rubs, or gallops,no edema.  Intact distal pulses bilaterally.  Respiratory:  clear to auscultation bilaterally, normal work of breathing GI: soft, nontender, nondistended, + BS MS: no deformity or atrophy Skin: warm and dry, no rash Neuro:  Alert and Oriented x 3, Strength and sensation are intact Psych: euthymic mood, full affect  Wt Readings from Last 3 Encounters:  02/12/16 250 lb 12.8 oz (113.762 kg)  11/10/15 252 lb (114.306 kg)  08/14/15 243 lb 12.8 oz (110.587 kg)      Studies/Labs Reviewed:   EKG:   EKG is ordered today and showed NSR at 90bpm with no ST changes  Recent Labs: 11/06/2015: ALT 15; BUN 21; Creatinine, Ser 0.96; Hemoglobin 12.7; Platelets 317.0; Potassium 4.6; Sodium 141; TSH 0.76   Lipid Panel    Component Value Date/Time   CHOL 129 11/06/2015 0959   TRIG 116.0 11/06/2015 0959   HDL 40.90 11/06/2015 0959   CHOLHDL 3 11/06/2015 0959   VLDL 23.2 11/06/2015 0959   LDLCALC 65 11/06/2015 0959    Additional studies/ records that were reviewed today include:  none    ASSESSMENT:    1. Coronary artery disease involving native coronary artery of native heart without angina pectoris   2. Essential hypertension   3. DCM (dilated cardiomyopathy) ([place])   4. Chronic combined systolic and diastolic CHF (congestive heart failure) (HCC)   5. Paroxysmal atrial fibrillation ([place])   6. Dyslipidemia      PLAN:  In order of problems listed above:  1. ASCAD with no angina.  Continue statin.  Not on ASA due to NOAC. 2. HTN - Bp controlled.  Continue aldactone/ARB.  Creatinine stable.  3. DCM - EF now 50-55% by echo 2015 4. Chronic combined systolic/diastolic CHF - appears euvolemic on exam.  Continue ARB/aldactone and Lasix.   5. PAF - Maintaining NSR.  Continue Xarelto.  I am going to start her on Toprol XL 25mg  daily to suppress PAF since she cannot tolerate Amio and heart rate is in upper 90's.   6. Dyslipidemia - LDL goal < 70.  Last LDL 65 on 10/2015. Continue statin    Medication Adjustments/Labs and Tests Ordered: Current medicines are reviewed at length with the patient today.  Concerns regarding medicines are outlined above.  Medication changes, Labs and Tests ordered today are listed in the Patient Instructions below. There are no Patient Instructions on file for this visit.   Lurena Nida, MD  02/12/2016 2:34 PM    [facility] [address], [place], [place]  [address] Phone: [phone]; Fax: [phone]    

## 2016-02-12 NOTE — Patient Instructions (Addendum)
Medication Instructions:  Your physician has recommended you make the following change in your medication: 1) START TOPROL 25 mg daily   Labwork: None  Testing/Procedures: None  Follow-Up: Your physician wants you to follow-up in: 6 months with Dr. Radford Pax. You will receive a reminder letter in the mail two months in advance. If you don't receive a letter, please call our office to schedule the follow-up appointment.   Any Other Special Instructions Will Be Listed Below (If Applicable).     If you need a refill on your cardiac medications before your next appointment, please call your pharmacy.

## 2016-03-15 ENCOUNTER — Encounter: Payer: Self-pay | Admitting: Internal Medicine

## 2016-03-15 ENCOUNTER — Ambulatory Visit (INDEPENDENT_AMBULATORY_CARE_PROVIDER_SITE_OTHER): Payer: Medicare Other | Admitting: Internal Medicine

## 2016-03-15 VITALS — BP 126/60 | HR 77 | Temp 97.9°F | Ht 66.0 in | Wt 252.0 lb

## 2016-03-15 DIAGNOSIS — S0340XA Sprain of jaw, unspecified side, initial encounter: Secondary | ICD-10-CM

## 2016-03-15 DIAGNOSIS — S0341XA Sprain of jaw, right side, initial encounter: Secondary | ICD-10-CM

## 2016-03-15 MED ORDER — TRAMADOL HCL 50 MG PO TABS: 50.0000 mg | ORAL_TABLET | Freq: Four times a day (QID) | ORAL | Status: DC | PRN

## 2016-03-15 NOTE — Patient Instructions (Signed)
You  may move around, but avoid painful motions and activities.  Apply ice to the sore area for 15 to 20 minutes 3 or 4 times daily for the next two to 3 days.  Temporomandibular Joint Syndrome Temporomandibular joint (TMJ) syndrome is a condition that affects the joints between your jaw and your skull. The TMJs are located near your ears and allow your jaw to open and close. These joints and the nearby muscles are involved in all movements of the jaw. People with TMJ syndrome have pain in the area of these joints and muscles. Chewing, biting, or other movements of the jaw can be difficult or painful. TMJ syndrome can be caused by various things. In many cases, the condition is mild and goes away within a few weeks. For some people, the condition can become a long-term problem. CAUSES Possible causes of TMJ syndrome include:  Grinding your teeth or clenching your jaw. Some people do this when they are under stress.  Arthritis.  Injury to the jaw.  Head or neck injury.  Teeth or dentures that are not aligned well. In some cases, the cause of TMJ syndrome may not be known. SIGNS AND SYMPTOMS The most common symptom is an aching pain on the side of the head in the area of the TMJ. Other symptoms may include:  Pain when moving your jaw, such as when chewing or biting.  Being unable to open your jaw all the way.  Making a clicking sound when you open your mouth.  Headache.  Earache.  Neck or shoulder pain. DIAGNOSIS Diagnosis can usually be made based on your symptoms, your medical history, and a physical exam. Your health care provider may check the range of motion of your jaw. Imaging tests, such as X-rays or an MRI, are sometimes done. You may need to see your dentist to determine if your teeth and jaw are lined up correctly. TREATMENT TMJ syndrome often goes away on its own. If treatment is needed, the options may include:  Eating soft foods and applying ice or heat.  Medicines  to relieve pain or inflammation.  Medicines to relax the muscles.  A splint, bite plate, or mouthpiece to prevent teeth grinding or jaw clenching.  Relaxation techniques or counseling to help reduce stress.  Transcutaneous electrical nerve stimulation (TENS). This helps to relieve pain by applying an electrical current through the skin.  Acupuncture. This is sometimes helpful to relieve pain.  Jaw surgery. This is rarely needed. HOME CARE INSTRUCTIONS  Take medicines only as directed by your health care provider.  Eat a soft diet if you are having trouble chewing.  Apply ice to the painful area.  Put ice in a plastic bag.  Place a towel between your skin and the bag.  Leave the ice on for 20 minutes, 2-3 times a day.  Apply a warm compress to the painful area as directed.  Massage your jaw area and perform any jaw stretching exercises as recommended by your health care provider.  If you were given a mouthpiece or bite plate, wear it as directed.  Avoid foods that require a lot of chewing. Do not chew gum.  Keep all follow-up visits as directed by your health care provider. This is important. SEEK MEDICAL CARE IF:  You are having trouble eating.  You have new or worsening symptoms. SEEK IMMEDIATE MEDICAL CARE IF:  Your jaw locks open or closed.   This information is not intended to replace advice given to you  by your health care provider. Make sure you discuss any questions you have with your health care provider.   Document Released: 07/06/2001 Document Revised: 11/01/2014 Document Reviewed: 05/16/2014 Elsevier Interactive Patient Education [employer].

## 2016-03-15 NOTE — Progress Notes (Signed)
Subjective:    Patient ID: Cynthia Herrera, female    DOB: 1946-03-06, 70 y.o.   MRN: [id number]  HPI  Lab Results  Component Value Date   HGBA1C 5.4 11/06/2015   70 year old patient who presents with a 2 day history of right jaw pain after biting into a large apple 2 days ago.  She's had persistent pain with chewing as well as local tenderness over the TMJ area.  No history of chronic TMJ symptoms.  She has diabetes which has been stable. She has osteoarthritis and is scheduled for elective right knee surgery in July  Past Medical History  Diagnosis Date  . Personal history of colonic polyps     adenomas 03 and 08  . Hyperlipidemia   . Osteopenia   . Vitamin D deficiency     takes Vit D  . Allergy     seasonal  . Arthritis   . Breast cancer ([facility])     left: surgery,chemo,radiation  . Atrial fibrillation ([facility] [facility])     s/p TEE DCCV and repeat DCCV 11/14/2013  . DCM (dilated cardiomyopathy) ([place])     EF 15-20% ? tachycardia induced - EF 50-55% on echo 2015  . Hypertension     takes Losartan and Metoprolol.   . Chronic systolic CHF (congestive heart failure) ([facility])     takes Furosemide and Aldactone daily  . COPD (chronic obstructive pulmonary disease) ([facility])     no inhalers   . History of bronchitis 15+yrs ago  . Joint pain   . Chronic back pain     lumbar stenosis  . Muscle spasm     takes Flexeril daily as needed   . GERD (gastroesophageal reflux disease)     once in a while;depends on what she eats  . Nocturia   . Cataracts, bilateral     immature  . Diabetes mellitus without complication [facility])     recent dx     Social History   Social History  . Marital Status: Widowed    Spouse Name: N/A  . Number of Children: N/A  . Years of Education: N/A   Occupational History  . Not on file.   Social History Main Topics  . Smoking status: Former Smoker    Quit date: 08/24/2001  . Smokeless tobacco: Never Used  . Alcohol Use: No  . Drug Use: No  . Sexual Activity:  Not on file   Other Topics Concern  . Not on file   Social History Narrative    Past Surgical History  Procedure Laterality Date  . Cholecystectomy  2004  . Colonoscopy  2003, September 2008, April 01, 2011    adenomas 03 and 08, polyp 12, diverticulosis  . Joint replacement      totally replacement surgery  . Tubal ligation    . Breast lumpectomy  2002  . Port-a-cath removal    . Total knee arthroplasty  2006  . Tee without cardioversion N/A 11/12/2013    Procedure: TRANSESOPHAGEAL ECHOCARDIOGRAM (TEE);  Surgeon: Thayer Headings, MD;  Location: [facility];  Service: Cardiovascular;  Laterality: N/A;  pt b/p low, pt buccal membranes very dry, lips scapped, pt c/o thirst. NPO since MN and iv FLUIDS TOTAL INFUSING AT TOTAL 20ML HR....  Dr. Cathie Olden order allow NS to bolus during procedure....very dry,NS bolus 250 ml total..pt responding well to meds..  . Cardioversion N/A 11/12/2013    Procedure: CARDIOVERSION;  Surgeon: Thayer Headings, MD;  Location: [facility];  Service:  Cardiovascular;  Laterality: N/A;  10:08  Dr. Marissa Nestle, anesthesia present, [place]   60mg ,  propofol 50mg , IV for elective cardioversion....Dr. Cathie Olden delievered synch 120 joules with successful cardioversion to NSR  . Cardioversion N/A 11/12/2013    Procedure: CARDIOVERSION;  Surgeon: Thayer Headings, MD;  Location: [place];  Service: Cardiovascular;  Laterality: N/A;  . Cardioversion N/A 11/14/2013    Procedure: CARDIOVERSION (BEDSIDE);  Surgeon: Larey Dresser, MD;  Location: [facility];  Service: Cardiovascular;  Laterality: N/A;  Shanda Howells a cath placed    . Mastectomy  July 2002    left  . Cardiac catheterization  2015  . Lumbar laminectomy/decompression microdiscectomy N/A 03/19/2014    Procedure: LUMBAR LAMINECTOMY/DECOMPRESSION MICRODISCECTOMY 4 LEVEL;  Surgeon: Kristeen Miss, MD;  Location: [place] [facility] NEURO ORS;  Service: Neurosurgery;  Laterality: N/A;  L1-2 L2-3 L3-4 L4-5 Laminectomy/Foraminotomy  . Left and right heart  catheterization with coronary angiogram N/A 02/01/2014    Procedure: LEFT AND RIGHT HEART CATHETERIZATION WITH CORONARY ANGIOGRAM;  Surgeon: Sueanne Margarita, MD;  Location: [facility] CATH LAB;  Service: Cardiovascular;  Laterality: N/A;    Family History  Problem Relation Age of Onset  . Dementia Sister   . Diabetes Sister   . Diabetes Brother   . Heart disease Brother   . Liver cancer Mother   . Diabetes Mother   . Cancer Mother     pancreatic  . Diabetes Father   . Heart disease Father     Allergies  Allergen Reactions  . Codeine Phosphate Other (See Comments)    "Weird feeling " (depending on dosage)    Current Outpatient Prescriptions on File Prior to Visit  Medication Sig Dispense Refill  . amoxicillin (AMOXIL) 500 MG capsule Take 4 capsules by mouth as needed. (Take one (1) hour prior to dental procedures)    . atorvastatin (LIPITOR) 40 MG tablet Take 1 tablet (40 mg total) by mouth at bedtime. 90 tablet 3  . Cholecalciferol (VITAMIN D) 2000 UNITS tablet Take 2,000 Units by mouth daily.     . fluticasone (FLONASE) 50 MCG/ACT nasal spray Place 1 spray into both nostrils daily as needed for allergies.    . furosemide (LASIX) 20 MG tablet TAKE 2 TABLETS (40 MG TOTAL) BY MOUTH DAILY. 180 tablet 3  . glucose blood (ONETOUCH VERIO) test strip 1 each by Other route daily as needed for other. 100 each 12  . KLOR-CON M20 20 MEQ tablet TAKE 1 TABLET (20 MEQ TOTAL) BY MOUTH DAILY. 180 tablet 3  . losartan (COZAAR) 25 MG tablet TAKE 1 TABLET TWICE A DAY 60 tablet 6  . metFORMIN (GLUCOPHAGE) 500 MG tablet TAKE 1 TABLET TWICE A DAY WITH A MEAL 180 tablet 1  . metoprolol succinate (TOPROL-XL) 25 MG 24 hr tablet Take 1 tablet (25 mg total) by mouth daily. 30 tablet 11  . Multiple Vitamin (MULTIVITAMIN) tablet Take 1 tablet by mouth daily.     Marland Kitchen spironolactone (ALDACTONE) 25 MG tablet TAKE 1 TABLET (25 MG TOTAL) BY MOUTH DAILY. 90 tablet 1  . XARELTO 20 MG TABS tablet TAKE 1 TABLET (20 MG TOTAL) BY  MOUTH DAILY WITH SUPPER. 30 tablet 6   No current facility-administered medications on file prior to visit.    BP 126/60 mmHg  Pulse 77  Temp(Src) 97.9 F (36.6 C) (Oral)  Ht 5\' 6"  (1.676 m)  Wt 252 lb (114.306 kg)  BMI 40.69 kg/m2  SpO2 97%     Review of Systems  Constitutional:  Negative.   HENT: Positive for facial swelling. Negative for congestion, dental problem, hearing loss, rhinorrhea, sinus pressure, sore throat and tinnitus.   Eyes: Negative for pain, discharge and visual disturbance.  Respiratory: Negative for cough and shortness of breath.   Cardiovascular: Negative for chest pain, palpitations and leg swelling.  Gastrointestinal: Negative for nausea, vomiting, abdominal pain, diarrhea, constipation, blood in stool and abdominal distention.  Genitourinary: Negative for dysuria, urgency, frequency, hematuria, flank pain, vaginal bleeding, vaginal discharge, difficulty urinating, vaginal pain and pelvic pain.  Musculoskeletal: Negative for joint swelling, arthralgias and gait problem.  Skin: Negative for rash.  Neurological: Negative for dizziness, syncope, speech difficulty, weakness, numbness and headaches.  Hematological: Negative for adenopathy.  Psychiatric/Behavioral: Negative for behavioral problems, dysphoric mood and agitation. The patient is not nervous/anxious.        Objective:   Physical Exam  Constitutional: She appears well-developed and well-nourished. No distress.  HENT:  Right Ear: External ear normal.  Mouth/Throat: Oropharynx is clear and moist.  Tenderness.  Some mild soft tissue swelling over the right TMJ          Assessment & Plan:   TMJ dysfunction.  Will apply ice for the next 2 or 3 days and observe.  Analgesics refilled Diabetes, stable Coronary artery disease, stable   Nyoka Cowden, MD

## 2016-03-15 NOTE — Progress Notes (Signed)
Pre visit review using our clinic review tool, if applicable. No additional management support is needed unless otherwise documented below in the visit note. 

## 2016-03-24 ENCOUNTER — Other Ambulatory Visit: Payer: Self-pay | Admitting: Cardiology

## 2016-04-04 ENCOUNTER — Ambulatory Visit: Payer: Self-pay | Admitting: Orthopedic Surgery

## 2016-04-30 ENCOUNTER — Encounter (HOSPITAL_COMMUNITY): Payer: Self-pay

## 2016-04-30 ENCOUNTER — Encounter (HOSPITAL_COMMUNITY)
Admission: RE | Admit: 2016-04-30 | Discharge: 2016-04-30 | Disposition: A | Discharge status: Home / Self Care | Payer: Medicare Other | Source: Ambulatory Visit | Attending: Orthopedic Surgery | Admitting: Orthopedic Surgery

## 2016-04-30 DIAGNOSIS — M1711 Unilateral primary osteoarthritis, right knee: Secondary | ICD-10-CM | POA: Diagnosis not present

## 2016-04-30 DIAGNOSIS — Z0183 Encounter for blood typing: Secondary | ICD-10-CM | POA: Insufficient documentation

## 2016-04-30 DIAGNOSIS — Z01812 Encounter for preprocedural laboratory examination: Secondary | ICD-10-CM | POA: Insufficient documentation

## 2016-04-30 LAB — URINALYSIS, ROUTINE W REFLEX MICROSCOPIC
BILIRUBIN URINE: NEGATIVE
GLUCOSE, UA: NEGATIVE mg/dL
Ketones, ur: NEGATIVE mg/dL
Leukocytes, UA: NEGATIVE
Nitrite: NEGATIVE
Protein, ur: NEGATIVE mg/dL
SPECIFIC GRAVITY, URINE: 1.013 (ref 1.005–1.030)
pH: 5.5 (ref 5.0–8.0)

## 2016-04-30 LAB — COMPREHENSIVE METABOLIC PANEL
ALBUMIN: 4.6 g/dL (ref 3.5–5.0)
ALK PHOS: 117 U/L (ref 38–126)
ALT: 21 U/L (ref 14–54)
ANION GAP: 8 (ref 5–15)
AST: 22 U/L (ref 15–41)
BILIRUBIN TOTAL: 0.7 mg/dL (ref 0.3–1.2)
BUN: 21 mg/dL — ABNORMAL HIGH (ref 6–20)
CALCIUM: 9.2 mg/dL (ref 8.9–10.3)
CO2: 24 mmol/L (ref 22–32)
Chloride: 104 mmol/L (ref 101–111)
Creatinine, Ser: 1.09 mg/dL — ABNORMAL HIGH (ref 0.44–1.00)
GFR calc Af Amer: 58 mL/min — ABNORMAL LOW (ref >60)
GFR calc non Af Amer: 50 mL/min — ABNORMAL LOW (ref >60)
Glucose, Bld: 124 mg/dL — ABNORMAL HIGH (ref 65–99)
POTASSIUM: 3.9 mmol/L (ref 3.5–5.1)
SODIUM: 136 mmol/L (ref 135–145)
TOTAL PROTEIN: 7.7 g/dL (ref 6.5–8.1)

## 2016-04-30 LAB — URINE MICROSCOPIC-ADD ON

## 2016-04-30 LAB — CBC
HEMATOCRIT: 35.9 % — AB (ref 36.0–46.0)
HEMOGLOBIN: 12.4 g/dL (ref 12.0–15.0)
MCH: 29.7 pg (ref 26.0–34.0)
MCHC: 34.5 g/dL (ref 30.0–36.0)
MCV: 85.9 fL (ref 78.0–100.0)
Platelets: 304 10*3/uL (ref 150–400)
RBC: 4.18 MIL/uL (ref 3.87–5.11)
RDW: 13.8 % (ref 11.5–15.5)
WBC: 7.9 10*3/uL (ref 4.0–10.5)

## 2016-04-30 LAB — PROTIME-INR
INR: 1.02 (ref 0.00–1.49)
Prothrombin Time: 13.6 seconds (ref 11.6–15.2)

## 2016-04-30 LAB — SURGICAL PCR SCREEN
MRSA, PCR: NEGATIVE
STAPHYLOCOCCUS AUREUS: NEGATIVE

## 2016-04-30 LAB — ABO/RH: ABO/RH(D): O POS

## 2016-04-30 LAB — APTT: APTT: 32 s (ref 24–37)

## 2016-04-30 NOTE — Pre-Procedure Instructions (Addendum)
Medical Clearance Dr. Burnice Logan on chart Cardiac Clearance Dr. Radford Pax on chart  EKG 02-12-16 epic Echo 08/2014 epic  Pt states her last dose of Xarelto will be Friday July 14th per Dr. Theodosia Blender instructions.

## 2016-04-30 NOTE — Patient Instructions (Addendum)
Cynthia Herrera  04/30/2016   Your procedure is scheduled on: 05/10/16  Report to [facility]  Entrance take [facility]  elevators to 3rd floor to  [place] at 9:40 AM.  Call this number if you have problems the morning of surgery [phone]   Remember: ONLY 1 PERSON MAY GO WITH YOU TO SHORT STAY TO GET  READY MORNING OF [place].  Do not eat food or drink liquids :After Midnight.  Follow physician's instructions on stopping Xarelto prior to surgery.     Take these medicines the morning of surgery with A SIP OF WATER: Metoprolol, Flonase if needed. DO NOT TAKE ANY DIABETIC MEDICATIONS DAY OF YOUR SURGERY                               You may not have any metal on your body including hair pins and              piercings  Do not wear jewelry, make-up, lotions, powders or perfumes, deodorant             Do not wear nail polish.  Do not shave  48 hours prior to surgery.              Men may shave face and neck.   Do not bring valuables to the hospital. [place].  Contacts, dentures or bridgework may not be worn into surgery.  Leave suitcase in the car. After surgery it may be brought to your room.               Please read over the following fact sheets you were given: _____________________________________________________________________             [facility] - Preparing for Surgery Before surgery, you can play an important role.  Because skin is not sterile, your skin needs to be as free of germs as possible.  You can reduce the number of germs on your skin by washing with CHG (chlorahexidine gluconate) soap before surgery.  CHG is an antiseptic cleaner which kills germs and bonds with the skin to continue killing germs even after washing. Please DO NOT use if you have an allergy to CHG or antibacterial soaps.  If your skin becomes reddened/irritated stop using the CHG and inform your nurse when  you arrive at Short Stay. Do not shave (including legs and underarms) for at least 48 hours prior to the first CHG shower.  You may shave your face/neck. Please follow these instructions carefully:  1.  Shower with CHG Soap the night before surgery and the  morning of Surgery.  2.  If you choose to wash your hair, wash your hair first as usual with your  normal  shampoo.  3.  After you shampoo, rinse your hair and body thoroughly to remove the  shampoo.                           4.  Use CHG as you would any other liquid soap.  You can apply chg directly  to the skin and wash  Gently with a scrungie or clean washcloth.  5.  Apply the CHG Soap to your body ONLY FROM THE NECK DOWN.   Do not use on face/ open                           Wound or open sores. Avoid contact with eyes, ears mouth and genitals (private parts).                       Wash face,  Genitals (private parts) with your normal soap.             6.  Wash thoroughly, paying special attention to the area where your surgery  will be performed.  7.  Thoroughly rinse your body with warm water from the neck down.  8.  DO NOT shower/wash with your normal soap after using and rinsing off  the CHG Soap.                9.  Pat yourself dry with a clean towel.            10.  Wear clean pajamas.            11.  Place clean sheets on your bed the night of your first shower and do not  sleep with pets. Day of Surgery : Do not apply any lotions/deodorants the morning of surgery.  Please wear clean clothes to the hospital/surgery center.  FAILURE TO FOLLOW THESE INSTRUCTIONS MAY RESULT IN THE CANCELLATION OF YOUR SURGERY PATIENT SIGNATURE_________________________________  NURSE SIGNATURE__________________________________  ________________________________________________________________________   Adam Phenix  An incentive spirometer is a tool that can help keep your lungs clear and active. This tool measures  how well you are filling your lungs with each breath. Taking long deep breaths may help reverse or decrease the chance of developing breathing (pulmonary) problems (especially infection) following:  A long period of time when you are unable to move or be active. BEFORE THE PROCEDURE   If the spirometer includes an indicator to show your best effort, your nurse or respiratory therapist will set it to a desired goal.  If possible, sit up straight or lean slightly forward. Try not to slouch.  Hold the incentive spirometer in an upright position. INSTRUCTIONS FOR USE   Sit on the edge of your bed if possible, or sit up as far as you can in bed or on a chair.  Hold the incentive spirometer in an upright position.  Breathe out normally.  Place the mouthpiece in your mouth and seal your lips tightly around it.  Breathe in slowly and as deeply as possible, raising the piston or the ball toward the top of the column.  Hold your breath for 3-5 seconds or for as long as possible. Allow the piston or ball to fall to the bottom of the column.  Remove the mouthpiece from your mouth and breathe out normally.  Rest for a few seconds and repeat Steps 1 through 7 at least 10 times every 1-2 hours when you are awake. Take your time and take a few normal breaths between deep breaths.  The spirometer may include an indicator to show your best effort. Use the indicator as a goal to work toward during each repetition.  After each set of 10 deep breaths, practice coughing to be sure your lungs are clear. If you have an incision (the cut made at the time of surgery),  support your incision when coughing by placing a pillow or rolled up towels firmly against it. Once you are able to get out of bed, walk around indoors and cough well. You may stop using the incentive spirometer when instructed by your caregiver.  RISKS AND COMPLICATIONS  Take your time so you do not get dizzy or light-headed.  If you are in  pain, you may need to take or ask for pain medication before doing incentive spirometry. It is harder to take a deep breath if you are having pain. AFTER USE  Rest and breathe slowly and easily.  It can be helpful to keep track of a log of your progress. Your caregiver can provide you with a simple table to help with this. If you are using the spirometer at home, follow these instructions: [place] IF:   You are having difficultly using the spirometer.  You have trouble using the spirometer as often as instructed.  Your pain medication is not giving enough relief while using the spirometer.  You develop fever of 100.5 F (38.1 C) or higher. SEEK IMMEDIATE MEDICAL CARE IF:   You cough up bloody sputum that had not been present before.  You develop fever of 102 F (38.9 C) or greater.  You develop worsening pain at or near the incision site. MAKE SURE YOU:   Understand these instructions.  Will watch your condition.  Will get help right away if you are not doing well or get worse. Document Released: 02/21/2007 Document Revised: 01/03/2012 Document Reviewed: 04/24/2007 ExitCare Patient Information 2014 [employer]Care, [place].   ________________________________________________________________________  WHAT IS A BLOOD TRANSFUSION? Blood Transfusion Information  A transfusion is the replacement of blood or some of its parts. Blood is made up of multiple cells which provide different functions.  Red blood cells carry oxygen and are used for blood loss replacement.  White blood cells fight against infection.  Platelets control bleeding.  Plasma helps clot blood.  Other blood products are available for specialized needs, such as hemophilia or other clotting disorders. BEFORE THE TRANSFUSION  Who gives blood for transfusions?   Healthy volunteers who are fully evaluated to make sure their blood is safe. This is blood bank blood. Transfusion therapy is the safest it has  ever been in the practice of medicine. Before blood is taken from a donor, a complete history is taken to make sure that person has no history of diseases nor engages in risky social behavior (examples are intravenous drug use or sexual activity with multiple partners). The donor's travel history is screened to minimize risk of transmitting infections, such as malaria. The donated blood is tested for signs of infectious diseases, such as HIV and hepatitis. The blood is then tested to be sure it is compatible with you in order to minimize the chance of a transfusion reaction. If you or a relative donates blood, this is often done in anticipation of surgery and is not appropriate for emergency situations. It takes many days to process the donated blood. RISKS AND COMPLICATIONS Although transfusion therapy is very safe and saves many lives, the main dangers of transfusion include:   Getting an infectious disease.  Developing a transfusion reaction. This is an allergic reaction to something in the blood you were given. Every precaution is taken to prevent this. The decision to have a blood transfusion has been considered carefully by your caregiver before blood is given. Blood is not given unless the benefits outweigh the risks. AFTER THE TRANSFUSION  Right after receiving a blood transfusion, you will usually feel much better and more energetic. This is especially true if your red blood cells have gotten low (anemic). The transfusion raises the level of the red blood cells which carry oxygen, and this usually causes an energy increase.  The nurse administering the transfusion will monitor you carefully for complications. HOME CARE INSTRUCTIONS  No special instructions are needed after a transfusion. You may find your energy is better. Speak with your caregiver about any limitations on activity for underlying diseases you may have. SEEK MEDICAL CARE IF:   Your condition is not improving after your  transfusion.  You develop redness or irritation at the intravenous (IV) site. SEEK IMMEDIATE MEDICAL CARE IF:  Any of the following symptoms occur over the next 12 hours:  Shaking chills.  You have a temperature by mouth above 102 F (38.9 C), not controlled by medicine.  Chest, back, or muscle pain.  People around you feel you are not acting correctly or are confused.  Shortness of breath or difficulty breathing.  Dizziness and fainting.  You get a rash or develop hives.  You have a decrease in urine output.  Your urine turns a dark color or changes to pink, red, or brown. Any of the following symptoms occur over the next 10 days:  You have a temperature by mouth above 102 F (38.9 C), not controlled by medicine.  Shortness of breath.  Weakness after normal activity.  The white part of the eye turns yellow (jaundice).  You have a decrease in the amount of urine or are urinating less often.  Your urine turns a dark color or changes to pink, red, or brown. Document Released: 10/08/2000 Document Revised: 01/03/2012 Document Reviewed: 05/27/2008 [facility] Patient Information 2014 [place], [place].  _______________________________________________________________________

## 2016-05-01 LAB — HEMOGLOBIN A1C
HEMOGLOBIN A1C: 5.7 % — AB (ref 4.8–5.6)
MEAN PLASMA GLUCOSE: 117 mg/dL

## 2016-05-03 ENCOUNTER — Encounter: Payer: Self-pay | Admitting: Internal Medicine

## 2016-05-03 ENCOUNTER — Ambulatory Visit (INDEPENDENT_AMBULATORY_CARE_PROVIDER_SITE_OTHER): Payer: Medicare Other | Admitting: Internal Medicine

## 2016-05-03 VITALS — BP 110/62 | HR 73 | Temp 97.9°F | Resp 20 | Ht 67.0 in | Wt 249.0 lb

## 2016-05-03 DIAGNOSIS — M48061 Spinal stenosis, lumbar region without neurogenic claudication: Secondary | ICD-10-CM

## 2016-05-03 DIAGNOSIS — I2510 Atherosclerotic heart disease of native coronary artery without angina pectoris: Secondary | ICD-10-CM

## 2016-05-03 DIAGNOSIS — E1151 Type 2 diabetes mellitus with diabetic peripheral angiopathy without gangrene: Secondary | ICD-10-CM | POA: Diagnosis not present

## 2016-05-03 DIAGNOSIS — M4806 Spinal stenosis, lumbar region: Secondary | ICD-10-CM

## 2016-05-03 DIAGNOSIS — E119 Type 2 diabetes mellitus without complications: Secondary | ICD-10-CM

## 2016-05-03 DIAGNOSIS — I10 Essential (primary) hypertension: Secondary | ICD-10-CM

## 2016-05-03 NOTE — Patient Instructions (Signed)
Limit your sodium (Salt) intake   Please check your hemoglobin A1c every 6 months    It is important that you exercise regularly, at least 20 minutes 3 to 4 times per week.  If you develop chest pain or shortness of breath seek  medical attention.  Return in 6 months for follow-up  

## 2016-05-03 NOTE — Progress Notes (Signed)
Pre visit review using our clinic review tool, if applicable. No additional management support is needed unless otherwise documented below in the visit note. 

## 2016-05-03 NOTE — Progress Notes (Signed)
Subjective:    Patient ID: Cynthia Herrera, female    DOB: 01-27-1946, 70 y.o.   MRN: [id number]  HPI  Lab Results  Component Value Date   HGBA1C 5.7* 04/30/2016    70 year old patient who is seen today for follow-up of type 2 diabetes.  Recent hemoglobin A1c was well controlled.  She is on metformin therapy only.  She has dyslipidemia and a history of essential hypertension.  She has coronary artery disease and a history of cardiomyopathy.  Remains on statin therapy as well as chronic anticoagulation due to PAF. No cardiac symptoms.  She is followed by cardiology.  She is scheduled for knee surgery next week Has maintained very nice glycemic control  Past Medical History  Diagnosis Date  . Personal history of colonic polyps     adenomas 03 and 08  . Hyperlipidemia   . Osteopenia   . Vitamin D deficiency     takes Vit D  . Allergy     seasonal  . Arthritis   . Breast cancer ([place])     left: surgery,chemo,radiation  . Atrial fibrillation ([place])     s/p TEE DCCV and repeat DCCV 11/14/2013  . DCM (dilated cardiomyopathy) ([place])     EF 15-20% ? tachycardia induced - EF 50-55% on echo 2015  . Hypertension     takes Losartan and Metoprolol.   . Chronic systolic CHF (congestive heart failure) ([facility])     takes Furosemide and Aldactone daily  . COPD (chronic obstructive pulmonary disease) ([facility])     no inhalers   . History of bronchitis 15+yrs ago  . Joint pain   . Chronic back pain     lumbar stenosis  . Muscle spasm     takes Flexeril daily as needed   . GERD (gastroesophageal reflux disease)     once in a while;depends on what she eats  . Nocturia   . Cataracts, bilateral     immature  . Diabetes mellitus without complication ([place])     recent dx  . Dysrhythmia     a fib     Social History   Social History  . Marital Status: Widowed    Spouse Name: N/A  . Number of Children: N/A  . Years of Education: N/A   Occupational History  . Not on file.   Social History  Main Topics  . Smoking status: Former Smoker    Quit date: 08/24/2001  . Smokeless tobacco: Never Used  . Alcohol Use: No  . Drug Use: No  . Sexual Activity: Not on file   Other Topics Concern  . Not on file   Social History Narrative    Past Surgical History  Procedure Laterality Date  . Cholecystectomy  2004  . Colonoscopy  2003, September 2008, April 01, 2011    adenomas 03 and 08, polyp 12, diverticulosis  . Joint replacement      totally replacement surgery  . Tubal ligation    . Breast lumpectomy  2002  . Port-a-cath removal    . Total knee arthroplasty  2006  . Tee without cardioversion N/A 11/12/2013    Procedure: TRANSESOPHAGEAL ECHOCARDIOGRAM (TEE);  Surgeon: Thayer Headings, MD;  Location: [facility];  Service: Cardiovascular;  Laterality: N/A;  pt b/p low, pt buccal membranes very dry, lips scapped, pt c/o thirst. NPO since MN and iv FLUIDS TOTAL INFUSING AT TOTAL 20ML HR....  Dr. Cathie Olden order allow NS to bolus during procedure....very dry,NS bolus 250  ml total..pt responding well to meds..  . Cardioversion N/A 11/12/2013    Procedure: CARDIOVERSION;  Surgeon: Thayer Headings, MD;  Location: [facility];  Service: Cardiovascular;  Laterality: N/A;  10:08  Dr. Marissa Nestle, anesthesia present, [place]   60mg ,  propofol 50mg , IV for elective cardioversion....Dr. Cathie Olden delievered synch 120 joules with successful cardioversion to NSR  . Cardioversion N/A 11/12/2013    Procedure: CARDIOVERSION;  Surgeon: Thayer Headings, MD;  Location: [facility];  Service: Cardiovascular;  Laterality: N/A;  . Cardioversion N/A 11/14/2013    Procedure: CARDIOVERSION (BEDSIDE);  Surgeon: Larey Dresser, MD;  Location: [facility];  Service: Cardiovascular;  Laterality: N/A;  Shanda Howells a cath placed    . Mastectomy  July 2002    left  . Cardiac catheterization  2015  . Lumbar laminectomy/decompression microdiscectomy N/A 03/19/2014    Procedure: LUMBAR LAMINECTOMY/DECOMPRESSION MICRODISCECTOMY 4 LEVEL;   Surgeon: Kristeen Miss, MD;  Location: [facility]URO ORS;  Service: Neurosurgery;  Laterality: N/A;  L1-2 L2-3 L3-4 L4-5 Laminectomy/Foraminotomy  . Left and right heart catheterization with coronary angiogram N/A 02/01/2014    Procedure: LEFT AND RIGHT HEART CATHETERIZATION WITH CORONARY ANGIOGRAM;  Surgeon: Sueanne Margarita, MD;  Location: [facility] CATH LAB;  Service: Cardiovascular;  Laterality: N/A;  . Back surgery      Family History  Problem Relation Age of Onset  . Dementia Sister   . Diabetes Sister   . Diabetes Brother   . Heart disease Brother   . Liver cancer Mother   . Diabetes Mother   . Cancer Mother     pancreatic  . Diabetes Father   . Heart disease Father     Allergies  Allergen Reactions  . Codeine Phosphate Other (See Comments)    "Weird feeling " (depending on dosage)    Current Outpatient Prescriptions on File Prior to Visit  Medication Sig Dispense Refill  . atorvastatin (LIPITOR) 40 MG tablet TAKE 1 TABLET (40 MG TOTAL) BY MOUTH AT BEDTIME. 90 tablet 3  . Cholecalciferol (VITAMIN D) 2000 UNITS tablet Take 2,000 Units by mouth daily.     . fluticasone (FLONASE) 50 MCG/ACT nasal spray Place 1 spray into both nostrils daily as needed for allergies.    . furosemide (LASIX) 20 MG tablet TAKE 2 TABLETS (40 MG TOTAL) BY MOUTH DAILY. 180 tablet 3  . glucose blood (ONETOUCH VERIO) test strip 1 each by Other route daily as needed for other. 100 each 12  . KLOR-CON M20 20 MEQ tablet TAKE 1 TABLET (20 MEQ TOTAL) BY MOUTH DAILY. 180 tablet 3  . losartan (COZAAR) 25 MG tablet TAKE 1 TABLET TWICE A DAY 60 tablet 6  . metFORMIN (GLUCOPHAGE) 500 MG tablet TAKE 1 TABLET TWICE A DAY WITH A MEAL 180 tablet 1  . metoprolol succinate (TOPROL-XL) 25 MG 24 hr tablet Take 1 tablet (25 mg total) by mouth daily. 30 tablet 11  . spironolactone (ALDACTONE) 25 MG tablet TAKE 1 TABLET (25 MG TOTAL) BY MOUTH DAILY. 90 tablet 3  . traMADol (ULTRAM) 50 MG tablet Take 1 tablet (50 mg total) by mouth every  6 (six) hours as needed. (pain) 90 tablet 0  . XARELTO 20 MG TABS tablet TAKE 1 TABLET (20 MG TOTAL) BY MOUTH DAILY WITH SUPPER. 30 tablet 6   No current facility-administered medications on file prior to visit.    BP 110/62 mmHg  Pulse 73  Temp(Src) 97.9 F (36.6 C) (Oral)  Resp 20  Ht 5\' 7"  (1.702  m)  Wt 249 lb (112.946 kg)  BMI 38.99 kg/m2  SpO2 97%     Review of Systems  Constitutional: Negative.   HENT: Negative for congestion, dental problem, hearing loss, rhinorrhea, sinus pressure, sore throat and tinnitus.   Eyes: Negative for pain, discharge and visual disturbance.  Respiratory: Negative for cough and shortness of breath.   Cardiovascular: Negative for chest pain, palpitations and leg swelling.  Gastrointestinal: Negative for nausea, vomiting, abdominal pain, diarrhea, constipation, blood in stool and abdominal distention.  Genitourinary: Negative for dysuria, urgency, frequency, hematuria, flank pain, vaginal bleeding, vaginal discharge, difficulty urinating, vaginal pain and pelvic pain.  Musculoskeletal: Negative for joint swelling, arthralgias and gait problem.  Skin: Negative for rash.  Neurological: Negative for dizziness, syncope, speech difficulty, weakness, numbness and headaches.  Hematological: Negative for adenopathy.  Psychiatric/Behavioral: Negative for behavioral problems, dysphoric mood and agitation. The patient is not nervous/anxious.        Objective:   Physical Exam  Constitutional: She is oriented to person, place, and time. She appears well-developed and well-nourished.  HENT:  Head: Normocephalic.  Right Ear: External ear normal.  Left Ear: External ear normal.  Mouth/Throat: Oropharynx is clear and moist.  Eyes: Conjunctivae and EOM are normal. Pupils are equal, round, and reactive to light.  Neck: Normal range of motion. Neck supple. No thyromegaly present.  Cardiovascular: Normal rate, regular rhythm, normal heart sounds and intact  distal pulses.   Pulmonary/Chest: Effort normal and breath sounds normal.  Abdominal: Soft. Bowel sounds are normal. She exhibits no mass. There is no tenderness.  Musculoskeletal: Normal range of motion.  Lymphadenopathy:    She has no cervical adenopathy.  Neurological: She is alert and oriented to person, place, and time.  Skin: Skin is warm and dry. No rash noted.  Psychiatric: She has a normal mood and affect. Her behavior is normal.          Assessment & Plan:   Diabetes mellitus area.  Excellent control.  Continue metformin therapy.  Exercise, weight loss encouraged Essential hypertension, stable Dyslipidemia.  Continue statin therapy Coronary artery disease, stable Chronic anticoagulation  Recheck 6 months CPX at that time  Nyoka Cowden, MD

## 2016-05-09 ENCOUNTER — Ambulatory Visit: Payer: Self-pay | Admitting: Orthopedic Surgery

## 2016-05-09 NOTE — H&P (Signed)
Cynthia Herrera DOB: May 06, 1946 Married / Language: English / Race: White Female Date of Admission:  05/10/16 CC:  Right Knee Pain History of Present Illness The patient is a 70 year old female who comes in for a preoperative History and Physical. The patient is scheduled for a right total knee arthroplasty to be performed by Dr. [name]. Aluisio, MD at [facility] on 05/10/2016. The patient is a 70 year old female who presented with knee complaints. The patient was seen in referral from [facility], [facility]. The patient reports right knee symptoms including: pain, swelling (minimal) and soreness which began month(s) (recently has gotten worse in the last month) ago without any known injury. The patient describes the severity of the symptoms as moderate in severity. The patient describes their pain as dull and aching.The patient feels that the symptoms are worsening. Prior to being seen, the patient was previously evaluated in this office. Previous work-up for this problem has included knee x-rays. Past treatment for this problem has included intra-articular injection of corticosteroids. Cynthia Herrera is referred over by Cynthia Herrera for evaluation of her right knee. Cynthia Herrera had a left total knee done by Dr. Onnie Graham in 2005 or 2006. She states that, that did very well. The right knee has been getting progressively worse in the past year or so. It has been bothering her for a long time and the past year has been bad. She hurts at all times. She does better when she is resting and at night, but any kind of activity is limiting her. She is having a harder time to do anything she desires. She would like to be more active, but cannot be because of the right knee. It is as bad as the left one was when she had that replaced. She is ready to proceed with the right knee surgery at this time. They have been treated conservatively in the past for the above stated problem and despite conservative measures,  they continue to have progressive pain and severe functional limitations and dysfunction. They have failed non-operative management including home exercise, medications, and injections. It is felt that they would benefit from undergoing total joint replacement. Risks and benefits of the procedure have been discussed with the patient and they elect to proceed with surgery. There are no active contraindications to surgery such as ongoing infection or rapidly progressive neurological disease.  Problem List/Past Medical  Primary osteoarthritis of right knee (M17.11)  Breast Cancer  Congestive Heart Failure  Coronary artery disease  Diabetes Mellitus, Type II  High blood pressure  Hypercholesterolemia  Atrial Fibrillation  Menopause  Measles  Allergies  No Known Drug Allergies  Family History Cancer  mother Depression  mother Diabetes Mellitus  father, sister and brother Drug / Alcohol Addiction  father, sister and brother Heart Disease  father and brother  Social History  Children  2 Current drinker  12/05/2015: Currently drinks wine only occasionally per week Current work status  retired Furniture conservator/restorer weekly; does running / walking and gym / [employer] Living situation  live alone Marital status  widowed No history of drug/alcohol rehab  Not under pain contract  Tobacco / smoke exposure  12/05/2015: no Tobacco use  12/05/2015: former smoker smoke(d) 1 1/2 pack(s) per day Post-Surgical Plans  Home following the Right Total Knee Arthropalsty on 05/10/2016  Medication History Klor-Con M20 (20MEQ Tablet ER, Oral) Active. Xarelto (20MG  Tablet, Oral) Active. Furosemide (20MG  Tablet, Oral) Active. MetFORMIN HCl (500MG  Tablet, Oral) Active.  Spironolactone (25MG  Tablet, Oral) Active. Atorvastatin Calcium (40MG  Tablet, Oral) Active. Losartan Potassium (25MG  Tablet, Oral) Active.   Past Surgical History  Breast Biopsy  left Breast Mass; Local  Excision  left Gallbladder Surgery  laporoscopic Spinal Decompression  Date: 2015. lower back Total Knee Replacement  Date: 2006. left   Review of Systems  General Not Present- Chills, Fatigue, Fever, Memory Loss, Night Sweats, Weight Gain and Weight Loss. Skin Not Present- Eczema, Hives, Itching, Lesions and Rash. HEENT Present- Dentures (partial). Not Present- Double Vision, Headache, Hearing Loss, Tinnitus and Visual Loss. Respiratory Not Present- Allergies, Chronic Cough, Coughing up blood, Shortness of breath at rest and Shortness of breath with exertion. Cardiovascular Not Present- Chest Pain, Difficulty Breathing Lying Down, Murmur, Palpitations, Racing/skipping heartbeats and Swelling. Gastrointestinal Not Present- Abdominal Pain, Bloody Stool, Constipation, Diarrhea, Difficulty Swallowing, Heartburn, Jaundice, Loss of appetitie, Nausea and Vomiting. Female Genitourinary Present- Urinary frequency. Not Present- Blood in Urine, Discharge, Flank Pain, Incontinence, Painful Urination, Urgency, Urinary Retention, Urinating at Night and Weak urinary stream. Musculoskeletal Present- Back Pain. Not Present- Joint Pain, Joint Swelling, Morning Stiffness, Muscle Pain, Muscle Weakness and Spasms. Neurological Not Present- Blackout spells, Difficulty with balance, Dizziness, Paralysis, Tremor and Weakness. Psychiatric Not Present- Insomnia.  Vitals Weight: 250 lb Height: 66in Weight was reported by patient. Height was reported by patient. Body Surface Area: 2.2 m Body Mass Index: 40.35 kg/m  Pulse: 76 (Regular)  BP: 132/68 (Sitting, Right Arm, Standard)  Physical Exam General Mental Status -Alert, cooperative and good historian. General Appearance-pleasant, Not in acute distress. Orientation-Oriented X3. Build & Nutrition-Well nourished and Well developed.  Head and Neck Head-normocephalic, atraumatic . Neck Global Assessment - supple, no bruit auscultated on  the right, no bruit auscultated on the left.  Eye Pupil - Bilateral-Regular and Round. Motion - Bilateral-EOMI.  Chest and Lung Exam Auscultation Breath sounds - clear at anterior chest wall and clear at posterior chest wall. Adventitious sounds - No Adventitious sounds.  Cardiovascular Auscultation Rhythm - Regular rate and rhythm. Heart Sounds - S1 WNL and S2 WNL. Murmurs & Other Heart Sounds - Auscultation of the heart reveals - No Murmurs.  Abdomen Palpation/Percussion Tenderness - Abdomen is non-tender to palpation. Rigidity (guarding) - Abdomen is soft. Auscultation Auscultation of the abdomen reveals - Bowel sounds normal.  Female Genitourinary Note: Not done, not pertinent to present illness   Musculoskeletal Note: On exam, she is alert and oriented, in no apparent distress. Her right hip shows normal range of motion, no discomfort. Right knee shows no effusion. Range about 5 to 120 with marked crepitus on range of motion. She is tender medially. There is no lateral tenderness or instability. Pulses, sensation and motor are intact.  RADIOGRAPHS Radiographs - AP both knees and lateral showing that she has bone-on-bone arthritis in the medial and patellofemoral compartments of the right knee. Her left knee prosthesis is in good position with no periprosthetic abnormalities.  Assessment & Plan Primary osteoarthritis of right knee (M17.11)  Note:Surgical Plans: Right Total Knee Replacement  Disposition: Home  PCP: Dr. Burnice Logan - Patient has been seen preoperatively and felt to be stable for surgery. Cards: Dr. Radford Pax - Patient has been seen preoperatively and felt to be stable for surgery. "Patient is cleared from cardiac standpoint. Ok to hold Xarelto 2 days prior to surgery."  Topical TXA - Breast Cancer  Anesthesia Issues: None  Signed electronically by Ok Edwards, III PA-C

## 2016-05-10 ENCOUNTER — Ambulatory Visit: Payer: Self-pay | Admitting: Internal Medicine

## 2016-05-10 ENCOUNTER — Inpatient Hospital Stay (HOSPITAL_COMMUNITY): Payer: Medicare Other | Admitting: Certified Registered Nurse Anesthetist

## 2016-05-10 ENCOUNTER — Encounter (HOSPITAL_COMMUNITY): Payer: Self-pay

## 2016-05-10 ENCOUNTER — Inpatient Hospital Stay (HOSPITAL_COMMUNITY)
Admission: RE | Admit: 2016-05-10 | Discharge: 2016-05-12 | DRG: 470 | Disposition: A | Discharge status: Home Health | Payer: Medicare Other | Source: Ambulatory Visit | Attending: Orthopedic Surgery | Admitting: Orthopedic Surgery

## 2016-05-10 ENCOUNTER — Encounter (HOSPITAL_COMMUNITY)
Admission: RE | Disposition: A | Discharge status: Home Health | Payer: Self-pay | Source: Ambulatory Visit | Attending: Orthopedic Surgery

## 2016-05-10 DIAGNOSIS — E119 Type 2 diabetes mellitus without complications: Secondary | ICD-10-CM | POA: Diagnosis not present

## 2016-05-10 DIAGNOSIS — I2510 Atherosclerotic heart disease of native coronary artery without angina pectoris: Secondary | ICD-10-CM | POA: Diagnosis not present

## 2016-05-10 DIAGNOSIS — Z6841 Body Mass Index (BMI) 40.0 and over, adult: Secondary | ICD-10-CM

## 2016-05-10 DIAGNOSIS — I7389 Other specified peripheral vascular diseases: Secondary | ICD-10-CM | POA: Diagnosis present

## 2016-05-10 DIAGNOSIS — J449 Chronic obstructive pulmonary disease, unspecified: Secondary | ICD-10-CM | POA: Diagnosis present

## 2016-05-10 DIAGNOSIS — I10 Essential (primary) hypertension: Secondary | ICD-10-CM | POA: Diagnosis not present

## 2016-05-10 DIAGNOSIS — M25561 Pain in right knee: Secondary | ICD-10-CM | POA: Diagnosis not present

## 2016-05-10 DIAGNOSIS — I110 Hypertensive heart disease with heart failure: Secondary | ICD-10-CM | POA: Diagnosis present

## 2016-05-10 DIAGNOSIS — M179 Osteoarthritis of knee, unspecified: Secondary | ICD-10-CM | POA: Diagnosis not present

## 2016-05-10 DIAGNOSIS — Z79899 Other long term (current) drug therapy: Secondary | ICD-10-CM

## 2016-05-10 DIAGNOSIS — Z96652 Presence of left artificial knee joint: Secondary | ICD-10-CM | POA: Diagnosis not present

## 2016-05-10 DIAGNOSIS — E7800 Pure hypercholesterolemia, unspecified: Secondary | ICD-10-CM | POA: Diagnosis present

## 2016-05-10 DIAGNOSIS — M1710 Unilateral primary osteoarthritis, unspecified knee: Secondary | ICD-10-CM | POA: Diagnosis present

## 2016-05-10 DIAGNOSIS — Z87891 Personal history of nicotine dependence: Secondary | ICD-10-CM

## 2016-05-10 DIAGNOSIS — I5022 Chronic systolic (congestive) heart failure: Secondary | ICD-10-CM | POA: Diagnosis present

## 2016-05-10 DIAGNOSIS — Z853 Personal history of malignant neoplasm of breast: Secondary | ICD-10-CM

## 2016-05-10 DIAGNOSIS — M1711 Unilateral primary osteoarthritis, right knee: Principal | ICD-10-CM | POA: Diagnosis present

## 2016-05-10 DIAGNOSIS — G8918 Other acute postprocedural pain: Secondary | ICD-10-CM | POA: Diagnosis not present

## 2016-05-10 HISTORY — PX: TOTAL KNEE ARTHROPLASTY: SHX125

## 2016-05-10 LAB — GLUCOSE, CAPILLARY
GLUCOSE-CAPILLARY: 139 mg/dL — AB (ref 65–99)
GLUCOSE-CAPILLARY: 145 mg/dL — AB (ref 65–99)
GLUCOSE-CAPILLARY: 218 mg/dL — AB (ref 65–99)

## 2016-05-10 LAB — PROTIME-INR
INR: 1.12 (ref 0.00–1.49)
Prothrombin Time: 14.2 seconds (ref 11.6–15.2)

## 2016-05-10 LAB — TYPE AND SCREEN
ABO/RH(D): O POS
ANTIBODY SCREEN: NEGATIVE

## 2016-05-10 SURGERY — ARTHROPLASTY, KNEE, TOTAL
Anesthesia: General | Site: Knee | Laterality: Right

## 2016-05-10 MED ORDER — DEXAMETHASONE SODIUM PHOSPHATE 10 MG/ML IJ SOLN
10.0000 mg | Freq: Once | INTRAMUSCULAR | Status: AC
  Administered 2016-05-10: 10 mg via INTRAVENOUS

## 2016-05-10 MED ORDER — METOPROLOL SUCCINATE ER 25 MG PO TB24
25.0000 mg | ORAL_TABLET | Freq: Every day | ORAL | Status: DC
  Administered 2016-05-11 – 2016-05-12 (×2): 25 mg via ORAL
  Filled 2016-05-10 (×2): qty 1

## 2016-05-10 MED ORDER — FLUTICASONE PROPIONATE 50 MCG/ACT NA SUSP
1.0000 | Freq: Every day | NASAL | Status: DC | PRN
  Filled 2016-05-10: qty 16

## 2016-05-10 MED ORDER — POTASSIUM CHLORIDE CRYS ER 20 MEQ PO TBCR
20.0000 meq | EXTENDED_RELEASE_TABLET | Freq: Every day | ORAL | Status: DC
  Administered 2016-05-11 – 2016-05-12 (×2): 20 meq via ORAL
  Filled 2016-05-10 (×2): qty 1

## 2016-05-10 MED ORDER — ACETAMINOPHEN 10 MG/ML IV SOLN
INTRAVENOUS | Status: AC
  Filled 2016-05-10: qty 100

## 2016-05-10 MED ORDER — ACETAMINOPHEN 650 MG RE SUPP: 650.0000 mg | Freq: Four times a day (QID) | RECTAL | Status: DC | PRN

## 2016-05-10 MED ORDER — TRANEXAMIC ACID 1000 MG/10ML IV SOLN
2000.0000 mg | Freq: Once | INTRAVENOUS | Status: DC
Start: 2016-05-10 — End: 2016-05-10
  Filled 2016-05-10: qty 20

## 2016-05-10 MED ORDER — PROPOFOL 10 MG/ML IV BOLUS
INTRAVENOUS | Status: AC
  Filled 2016-05-10: qty 40

## 2016-05-10 MED ORDER — PROMETHAZINE HCL 25 MG/ML IJ SOLN
6.2500 mg | INTRAMUSCULAR | Status: DC | PRN
  Administered 2016-05-10: 12.5 mg via INTRAVENOUS

## 2016-05-10 MED ORDER — PHENYLEPHRINE HCL 10 MG/ML IJ SOLN
INTRAMUSCULAR | Status: DC | PRN
  Administered 2016-05-10 (×3): 40 ug via INTRAVENOUS
  Administered 2016-05-10 (×2): 80 ug via INTRAVENOUS

## 2016-05-10 MED ORDER — MENTHOL 3 MG MT LOZG: 1.0000 | LOZENGE | OROMUCOSAL | Status: DC | PRN

## 2016-05-10 MED ORDER — PROPOFOL 10 MG/ML IV BOLUS
INTRAVENOUS | Status: AC
  Filled 2016-05-10: qty 20

## 2016-05-10 MED ORDER — SUGAMMADEX SODIUM 200 MG/2ML IV SOLN
INTRAVENOUS | Status: DC | PRN
  Administered 2016-05-10: 200 mg via INTRAVENOUS

## 2016-05-10 MED ORDER — PHENYLEPHRINE 40 MCG/ML (10ML) SYRINGE FOR IV PUSH (FOR BLOOD PRESSURE SUPPORT)
PREFILLED_SYRINGE | INTRAVENOUS | Status: AC
  Filled 2016-05-10: qty 10

## 2016-05-10 MED ORDER — ACETAMINOPHEN 160 MG/5ML PO SOLN: 325.0000 mg | ORAL | Status: DC | PRN

## 2016-05-10 MED ORDER — FENTANYL CITRATE (PF) 100 MCG/2ML IJ SOLN
INTRAMUSCULAR | Status: AC
  Filled 2016-05-10: qty 2

## 2016-05-10 MED ORDER — ONDANSETRON HCL 4 MG/2ML IJ SOLN: 4.0000 mg | Freq: Four times a day (QID) | INTRAMUSCULAR | Status: DC | PRN

## 2016-05-10 MED ORDER — LACTATED RINGERS IV SOLN
INTRAVENOUS | Status: DC
  Administered 2016-05-10: 13:00:00 via INTRAVENOUS
  Administered 2016-05-10: 1000 mL via INTRAVENOUS

## 2016-05-10 MED ORDER — PROPOFOL 10 MG/ML IV BOLUS
INTRAVENOUS | Status: DC | PRN
  Administered 2016-05-10: 170 mg via INTRAVENOUS

## 2016-05-10 MED ORDER — SUGAMMADEX SODIUM 200 MG/2ML IV SOLN
INTRAVENOUS | Status: AC
  Filled 2016-05-10: qty 2

## 2016-05-10 MED ORDER — HYDROMORPHONE HCL 2 MG PO TABS
2.0000 mg | ORAL_TABLET | ORAL | Status: DC | PRN
  Administered 2016-05-10 – 2016-05-12 (×6): 2 mg via ORAL
  Filled 2016-05-10 (×6): qty 1

## 2016-05-10 MED ORDER — HYDROMORPHONE HCL 1 MG/ML IJ SOLN
0.5000 mg | INTRAMUSCULAR | Status: DC | PRN
  Administered 2016-05-10: 0.5 mg via INTRAVENOUS
  Filled 2016-05-10: qty 1

## 2016-05-10 MED ORDER — FLEET ENEMA 7-19 GM/118ML RE ENEM: 1.0000 | ENEMA | Freq: Once | RECTAL | Status: DC | PRN

## 2016-05-10 MED ORDER — CEFAZOLIN SODIUM-DEXTROSE 2-4 GM/100ML-% IV SOLN
INTRAVENOUS | Status: AC
  Filled 2016-05-10: qty 100

## 2016-05-10 MED ORDER — ACETAMINOPHEN 10 MG/ML IV SOLN
1000.0000 mg | Freq: Once | INTRAVENOUS | Status: AC
  Administered 2016-05-10: 1000 mg via INTRAVENOUS
  Filled 2016-05-10: qty 100

## 2016-05-10 MED ORDER — BUPIVACAINE LIPOSOME 1.3 % IJ SUSP
INTRAMUSCULAR | Status: DC | PRN
  Administered 2016-05-10: 20 mL

## 2016-05-10 MED ORDER — POLYETHYLENE GLYCOL 3350 17 G PO PACK: 17.0000 g | PACK | Freq: Every day | ORAL | Status: DC | PRN

## 2016-05-10 MED ORDER — SODIUM CHLORIDE 0.9 % IJ SOLN
INTRAMUSCULAR | Status: AC
  Filled 2016-05-10: qty 50

## 2016-05-10 MED ORDER — LIDOCAINE HCL (CARDIAC) 20 MG/ML IV SOLN
INTRAVENOUS | Status: DC | PRN
  Administered 2016-05-10: 100 mg via INTRAVENOUS

## 2016-05-10 MED ORDER — CHLORHEXIDINE GLUCONATE 4 % EX LIQD: 60.0000 mL | Freq: Once | CUTANEOUS | Status: DC

## 2016-05-10 MED ORDER — MIDAZOLAM HCL 2 MG/2ML IJ SOLN
INTRAMUSCULAR | Status: AC
  Filled 2016-05-10: qty 2

## 2016-05-10 MED ORDER — METFORMIN HCL 500 MG PO TABS: 500.0000 mg | ORAL_TABLET | Freq: Two times a day (BID) | ORAL | Status: DC

## 2016-05-10 MED ORDER — FUROSEMIDE 40 MG PO TABS
40.0000 mg | ORAL_TABLET | Freq: Every day | ORAL | Status: DC
  Administered 2016-05-11 – 2016-05-12 (×2): 40 mg via ORAL
  Filled 2016-05-10 (×2): qty 1

## 2016-05-10 MED ORDER — OXYCODONE HCL 5 MG PO TABS: 5.0000 mg | ORAL_TABLET | Freq: Once | ORAL | Status: DC | PRN

## 2016-05-10 MED ORDER — METHOCARBAMOL 1000 MG/10ML IJ SOLN
500.0000 mg | Freq: Four times a day (QID) | INTRAVENOUS | Status: DC | PRN
  Filled 2016-05-10: qty 5

## 2016-05-10 MED ORDER — TRAMADOL HCL 50 MG PO TABS: 50.0000 mg | ORAL_TABLET | Freq: Four times a day (QID) | ORAL | Status: DC | PRN

## 2016-05-10 MED ORDER — MIDAZOLAM HCL 5 MG/5ML IJ SOLN
INTRAMUSCULAR | Status: DC | PRN
  Administered 2016-05-10: 2 mg via INTRAVENOUS

## 2016-05-10 MED ORDER — HYDROMORPHONE HCL 1 MG/ML IJ SOLN: 0.2500 mg | INTRAMUSCULAR | Status: DC | PRN

## 2016-05-10 MED ORDER — ATORVASTATIN CALCIUM 20 MG PO TABS
40.0000 mg | ORAL_TABLET | Freq: Every day | ORAL | Status: DC
  Administered 2016-05-10 – 2016-05-11 (×2): 40 mg via ORAL
  Filled 2016-05-10 (×2): qty 2

## 2016-05-10 MED ORDER — SODIUM CHLORIDE 0.9 % IV SOLN
INTRAVENOUS | Status: DC
  Administered 2016-05-10 – 2016-05-11 (×2): 75 mL/h via INTRAVENOUS

## 2016-05-10 MED ORDER — ONDANSETRON HCL 4 MG/2ML IJ SOLN
INTRAMUSCULAR | Status: DC | PRN
  Administered 2016-05-10: 4 mg via INTRAVENOUS

## 2016-05-10 MED ORDER — TRANEXAMIC ACID 1000 MG/10ML IV SOLN
2000.0000 mg | INTRAVENOUS | Status: DC | PRN
  Administered 2016-05-10: 2000 mg via TOPICAL

## 2016-05-10 MED ORDER — DOCUSATE SODIUM 100 MG PO CAPS
100.0000 mg | ORAL_CAPSULE | Freq: Two times a day (BID) | ORAL | Status: DC
  Administered 2016-05-10 – 2016-05-12 (×4): 100 mg via ORAL
  Filled 2016-05-10 (×4): qty 1

## 2016-05-10 MED ORDER — BUPIVACAINE-EPINEPHRINE (PF) 0.5% -1:200000 IJ SOLN
INTRAMUSCULAR | Status: DC | PRN
  Administered 2016-05-10: 20 mL via PERINEURAL

## 2016-05-10 MED ORDER — SODIUM CHLORIDE 0.9 % IJ SOLN
INTRAMUSCULAR | Status: DC | PRN
  Administered 2016-05-10: 30 mL

## 2016-05-10 MED ORDER — ROCURONIUM BROMIDE 100 MG/10ML IV SOLN
INTRAVENOUS | Status: DC | PRN
  Administered 2016-05-10: 40 mg via INTRAVENOUS

## 2016-05-10 MED ORDER — ACETAMINOPHEN 325 MG PO TABS: 650.0000 mg | ORAL_TABLET | Freq: Four times a day (QID) | ORAL | Status: DC | PRN

## 2016-05-10 MED ORDER — DIPHENHYDRAMINE HCL 12.5 MG/5ML PO ELIX: 12.5000 mg | ORAL_SOLUTION | ORAL | Status: DC | PRN

## 2016-05-10 MED ORDER — HYDROMORPHONE HCL 1 MG/ML IJ SOLN
INTRAMUSCULAR | Status: DC | PRN
  Administered 2016-05-10 (×2): 0.5 mg via INTRAVENOUS

## 2016-05-10 MED ORDER — PROMETHAZINE HCL 25 MG/ML IJ SOLN
INTRAMUSCULAR | Status: AC
  Filled 2016-05-10: qty 1

## 2016-05-10 MED ORDER — ACETAMINOPHEN 500 MG PO TABS
1000.0000 mg | ORAL_TABLET | Freq: Four times a day (QID) | ORAL | Status: AC
  Administered 2016-05-10 – 2016-05-11 (×3): 1000 mg via ORAL
  Filled 2016-05-10 (×3): qty 2

## 2016-05-10 MED ORDER — BISACODYL 10 MG RE SUPP: 10.0000 mg | Freq: Every day | RECTAL | Status: DC | PRN

## 2016-05-10 MED ORDER — SODIUM CHLORIDE 0.9 % IR SOLN
Status: DC | PRN
  Administered 2016-05-10: 1000 mL

## 2016-05-10 MED ORDER — METHOCARBAMOL 500 MG PO TABS
500.0000 mg | ORAL_TABLET | Freq: Four times a day (QID) | ORAL | Status: DC | PRN
  Administered 2016-05-10 – 2016-05-12 (×5): 500 mg via ORAL
  Filled 2016-05-10 (×5): qty 1

## 2016-05-10 MED ORDER — INSULIN ASPART 100 UNIT/ML ~~LOC~~ SOLN
0.0000 [IU] | Freq: Three times a day (TID) | SUBCUTANEOUS | Status: DC
  Administered 2016-05-11: 2 [IU] via SUBCUTANEOUS
  Administered 2016-05-11: 3 [IU] via SUBCUTANEOUS
  Administered 2016-05-11: 2 [IU] via SUBCUTANEOUS
  Filled 2016-05-10: qty 1

## 2016-05-10 MED ORDER — CEFAZOLIN SODIUM-DEXTROSE 2-4 GM/100ML-% IV SOLN
2.0000 g | INTRAVENOUS | Status: AC
  Administered 2016-05-10: 2 g via INTRAVENOUS
  Filled 2016-05-10: qty 100

## 2016-05-10 MED ORDER — FENTANYL CITRATE (PF) 100 MCG/2ML IJ SOLN
INTRAMUSCULAR | Status: DC | PRN
  Administered 2016-05-10: 100 ug via INTRAVENOUS
  Administered 2016-05-10 (×2): 50 ug via INTRAVENOUS

## 2016-05-10 MED ORDER — LOSARTAN POTASSIUM 25 MG PO TABS
25.0000 mg | ORAL_TABLET | Freq: Two times a day (BID) | ORAL | Status: DC
  Administered 2016-05-10 – 2016-05-12 (×3): 25 mg via ORAL
  Filled 2016-05-10 (×5): qty 1

## 2016-05-10 MED ORDER — SUCCINYLCHOLINE CHLORIDE 20 MG/ML IJ SOLN
INTRAMUSCULAR | Status: DC | PRN
  Administered 2016-05-10: 100 mg via INTRAVENOUS

## 2016-05-10 MED ORDER — PHENOL 1.4 % MT LIQD
1.0000 | OROMUCOSAL | Status: DC | PRN
Start: 2016-05-10 — End: 2016-05-12

## 2016-05-10 MED ORDER — BUPIVACAINE HCL (PF) 0.25 % IJ SOLN
INTRAMUSCULAR | Status: AC
  Filled 2016-05-10: qty 30

## 2016-05-10 MED ORDER — CEFAZOLIN SODIUM-DEXTROSE 2-4 GM/100ML-% IV SOLN
2.0000 g | Freq: Four times a day (QID) | INTRAVENOUS | Status: AC
  Administered 2016-05-10 (×2): 2 g via INTRAVENOUS
  Filled 2016-05-10 (×2): qty 100

## 2016-05-10 MED ORDER — METOCLOPRAMIDE HCL 5 MG PO TABS: 5.0000 mg | ORAL_TABLET | Freq: Three times a day (TID) | ORAL | Status: DC | PRN

## 2016-05-10 MED ORDER — ONDANSETRON HCL 4 MG PO TABS: 4.0000 mg | ORAL_TABLET | Freq: Four times a day (QID) | ORAL | Status: DC | PRN

## 2016-05-10 MED ORDER — OXYCODONE HCL 5 MG/5ML PO SOLN
5.0000 mg | Freq: Once | ORAL | Status: DC | PRN
  Filled 2016-05-10: qty 5

## 2016-05-10 MED ORDER — BUPIVACAINE-EPINEPHRINE (PF) 0.5% -1:200000 IJ SOLN
INTRAMUSCULAR | Status: AC
  Filled 2016-05-10: qty 30

## 2016-05-10 MED ORDER — ACETAMINOPHEN 325 MG PO TABS: 325.0000 mg | ORAL_TABLET | ORAL | Status: DC | PRN

## 2016-05-10 MED ORDER — RIVAROXABAN 10 MG PO TABS
10.0000 mg | ORAL_TABLET | Freq: Every day | ORAL | Status: DC
  Administered 2016-05-11 – 2016-05-12 (×2): 10 mg via ORAL
  Filled 2016-05-10 (×2): qty 1

## 2016-05-10 MED ORDER — METOCLOPRAMIDE HCL 5 MG/ML IJ SOLN: 5.0000 mg | Freq: Three times a day (TID) | INTRAMUSCULAR | Status: DC | PRN

## 2016-05-10 MED ORDER — HYDROMORPHONE HCL 2 MG/ML IJ SOLN
INTRAMUSCULAR | Status: AC
  Filled 2016-05-10: qty 1

## 2016-05-10 MED ORDER — LIDOCAINE HCL (CARDIAC) 20 MG/ML IV SOLN
INTRAVENOUS | Status: AC
  Filled 2016-05-10: qty 5

## 2016-05-10 MED ORDER — SPIRONOLACTONE 25 MG PO TABS
25.0000 mg | ORAL_TABLET | Freq: Every day | ORAL | Status: DC
  Administered 2016-05-11 – 2016-05-12 (×2): 25 mg via ORAL
  Filled 2016-05-10 (×2): qty 1

## 2016-05-10 MED ORDER — BUPIVACAINE LIPOSOME 1.3 % IJ SUSP
20.0000 mL | Freq: Once | INTRAMUSCULAR | Status: DC
  Filled 2016-05-10: qty 20

## 2016-05-10 SURGICAL SUPPLY — 53 items
BAG DECANTER FOR FLEXI CONT (MISCELLANEOUS) ×3 IMPLANT
BAG SPEC THK2 15X12 ZIP CLS (MISCELLANEOUS) ×1
BAG ZIPLOCK 12X15 (MISCELLANEOUS) ×3 IMPLANT
BANDAGE ACE 6X5 VEL STRL LF (GAUZE/BANDAGES/DRESSINGS) ×1 IMPLANT
BANDAGE ELASTIC 6 VELCRO ST LF (GAUZE/BANDAGES/DRESSINGS) ×2 IMPLANT
BLADE SAG 18X100X1.27 (BLADE) ×3 IMPLANT
BLADE SAW SGTL 11.0X1.19X90.0M (BLADE) ×3 IMPLANT
BOWL SMART MIX CTS (DISPOSABLE) ×3 IMPLANT
CAP KNEE TOTAL 3 SIGMA ×2 IMPLANT
CEMENT HV SMART SET (Cement) ×6 IMPLANT
CLOSURE WOUND 1/2 X4 (GAUZE/BANDAGES/DRESSINGS) ×1
CLOTH BEACON ORANGE TIMEOUT ST (SAFETY) ×3 IMPLANT
CUFF TOURN SGL QUICK 34 (TOURNIQUET CUFF) ×3
CUFF TRNQT CYL 34X4X40X1 (TOURNIQUET CUFF) ×1 IMPLANT
DECANTER SPIKE VIAL GLASS SM (MISCELLANEOUS) ×3 IMPLANT
DRAPE U-SHAPE 47X51 STRL (DRAPES) ×3 IMPLANT
DRSG ADAPTIC 3X8 NADH LF (GAUZE/BANDAGES/DRESSINGS) ×3 IMPLANT
DRSG PAD ABDOMINAL 8X10 ST (GAUZE/BANDAGES/DRESSINGS) ×1 IMPLANT
DURAPREP 26ML APPLICATOR (WOUND CARE) ×3 IMPLANT
ELECT REM PT RETURN 9FT ADLT (ELECTROSURGICAL) ×3
ELECTRODE REM PT RTRN 9FT ADLT (ELECTROSURGICAL) ×1 IMPLANT
EVACUATOR 1/8 PVC DRAIN (DRAIN) ×3 IMPLANT
GAUZE SPONGE 4X4 12PLY STRL (GAUZE/BANDAGES/DRESSINGS) ×3 IMPLANT
GLOVE BIO SURGEON STRL SZ7.5 (GLOVE) IMPLANT
GLOVE BIO SURGEON STRL SZ8 (GLOVE) ×3 IMPLANT
GLOVE BIOGEL PI IND STRL 6.5 (GLOVE) IMPLANT
GLOVE BIOGEL PI IND STRL 8 (GLOVE) ×1 IMPLANT
GLOVE BIOGEL PI INDICATOR 6.5 (GLOVE)
GLOVE BIOGEL PI INDICATOR 8 (GLOVE) ×2
GLOVE SURG SS PI 6.5 STRL IVOR (GLOVE) IMPLANT
GOWN STRL REUS W/TWL LRG LVL3 (GOWN DISPOSABLE) ×3 IMPLANT
GOWN STRL REUS W/TWL XL LVL3 (GOWN DISPOSABLE) IMPLANT
HANDPIECE INTERPULSE COAX TIP (DISPOSABLE) ×3
IMMOBILIZER KNEE 20 (SOFTGOODS) ×2 IMPLANT
IMMOBILIZER KNEE 20 THIGH 36 (SOFTGOODS) ×1 IMPLANT
MANIFOLD NEPTUNE II (INSTRUMENTS) ×3 IMPLANT
NS IRRIG 1000ML POUR BTL (IV SOLUTION) ×3 IMPLANT
PACK TOTAL KNEE CUSTOM (KITS) ×3 IMPLANT
PAD ABD 8X10 STRL (GAUZE/BANDAGES/DRESSINGS) ×2 IMPLANT
PADDING CAST COTTON 6X4 STRL (CAST SUPPLIES) ×5 IMPLANT
POSITIONER SURGICAL ARM (MISCELLANEOUS) ×3 IMPLANT
SET HNDPC FAN SPRY TIP SCT (DISPOSABLE) ×1 IMPLANT
STRIP CLOSURE SKIN 1/2X4 (GAUZE/BANDAGES/DRESSINGS) ×3 IMPLANT
SUT MNCRL AB 4-0 PS2 18 (SUTURE) ×3 IMPLANT
SUT VIC AB 2-0 CT1 27 (SUTURE) ×9
SUT VIC AB 2-0 CT1 TAPERPNT 27 (SUTURE) ×3 IMPLANT
SUT VLOC 180 0 24IN GS25 (SUTURE) ×3 IMPLANT
SYR 50ML LL SCALE MARK (SYRINGE) ×3 IMPLANT
TRAY FOLEY W/METER SILVER 14FR (SET/KITS/TRAYS/PACK) ×3 IMPLANT
TRAY FOLEY W/METER SILVER 16FR (SET/KITS/TRAYS/PACK) ×1 IMPLANT
WATER STERILE IRR 1500ML POUR (IV SOLUTION) ×3 IMPLANT
WRAP KNEE MAXI GEL POST OP (GAUZE/BANDAGES/DRESSINGS) ×3 IMPLANT
YANKAUER SUCT BULB TIP 10FT TU (MISCELLANEOUS) ×3 IMPLANT

## 2016-05-10 NOTE — Anesthesia Preprocedure Evaluation (Addendum)
Anesthesia Evaluation  Patient identified by MRN, date of birth, ID band Patient awake    Reviewed: Allergy & Precautions, NPO status , Patient's Chart, lab work & pertinent test results, reviewed documented beta blocker date and time   History of Anesthesia Complications Negative for: history of anesthetic complications  Airway Mallampati: III  TM Distance: <3 FB Neck ROM: Full    Dental  (+) Teeth Intact, Partial Lower   Pulmonary neg shortness of breath, neg sleep apnea, COPD, neg recent URI, former smoker, neg PE   breath sounds clear to auscultation       Cardiovascular hypertension, Pt. on medications and Pt. on home beta blockers (-) angina+ CAD, + Peripheral Vascular Disease, +CHF and + DOE  (-) Past MI + dysrhythmias Atrial Fibrillation  Rhythm:Regular     Neuro/Psych negative neurological ROS  negative psych ROS   GI/Hepatic GERD  Controlled,  Endo/Other  diabetes, Type 2, Oral Hypoglycemic AgentsMorbid obesity  Renal/GU      Musculoskeletal  (+) Arthritis ,   Abdominal   Peds  Hematology   Anesthesia Other Findings   Reproductive/Obstetrics                            Anesthesia Physical Anesthesia Plan  ASA: III  Anesthesia Plan: General   Post-op Pain Management:    Induction: Intravenous  Airway Management Planned: Oral ETT  Additional Equipment: None  Intra-op Plan:   Post-operative Plan: Extubation in OR  Informed Consent: I have reviewed the patients History and Physical, chart, labs and discussed the procedure including the risks, benefits and alternatives for the proposed anesthesia with the patient or authorized representative who has indicated his/her understanding and acceptance.   Dental advisory given  Plan Discussed with: CRNA and Surgeon  Anesthesia Plan Comments:         Anesthesia Quick Evaluation

## 2016-05-10 NOTE — H&P (View-Only) (Signed)
Cynthia Herrera DOB: [birth year] Married / Language: English / Race: White Female Date of Admission:  05/10/16 CC:  Right Knee Pain History of Present Illness The patient is a 70 year old female who comes in for a preoperative History and Physical. The patient is scheduled for a right total knee arthroplasty to be performed by Dr. [name]. Aluisio, MD at [facility] on 05/10/2016. The patient is a 70 year old female who presented with knee complaints. The patient was seen in referral from [facility], [facility]. The patient reports right knee symptoms including: pain, swelling (minimal) and soreness which began month(s) (recently has gotten worse in the last month) ago without any known injury. The patient describes the severity of the symptoms as moderate in severity. The patient describes their pain as dull and aching.The patient feels that the symptoms are worsening. Prior to being seen, the patient was previously evaluated in this office. Previous work-up for this problem has included knee x-rays. Past treatment for this problem has included intra-articular injection of corticosteroids. Ms. Brooking is referred over by [name] for evaluation of her right knee. Ms. Milanowski had a left total knee done by Dr. Onnie Graham in 2005 or 2006. She states that, that did very well. The right knee has been getting progressively worse in the past year or so. It has been bothering her for a long time and the past year has been bad. She hurts at all times. She does better when she is resting and at night, but any kind of activity is limiting her. She is having a harder time to do anything she desires. She would like to be more active, but cannot be because of the right knee. It is as bad as the left one was when she had that replaced. She is ready to proceed with the right knee surgery at this time. They have been treated conservatively in the past for the above stated problem and despite conservative measures,  they continue to have progressive pain and severe functional limitations and dysfunction. They have failed non-operative management including home exercise, medications, and injections. It is felt that they would benefit from undergoing total joint replacement. Risks and benefits of the procedure have been discussed with the patient and they elect to proceed with surgery. There are no active contraindications to surgery such as ongoing infection or rapidly progressive neurological disease.  Problem List/Past Medical  Primary osteoarthritis of right knee (M17.11)  Breast Cancer  Congestive Heart Failure  Coronary artery disease  Diabetes Mellitus, Type II  High blood pressure  Hypercholesterolemia  Atrial Fibrillation  Menopause  Measles  Allergies  No Known Drug Allergies  Family History Cancer  mother Depression  mother Diabetes Mellitus  father, sister and brother Drug / Alcohol Addiction  father, sister and brother Heart Disease  father and brother  Social History  Children  2 Current drinker  12/05/2015: Currently drinks wine only occasionally per week Current work status  retired Furniture conservator/restorer weekly; does running / walking and gym / [employer] Living situation  live alone Marital status  widowed No history of drug/alcohol rehab  Not under pain contract  Tobacco / smoke exposure  12/05/2015: no Tobacco use  12/05/2015: former smoker smoke(d) 1 1/2 pack(s) per day Post-Surgical Plans  Home following the Right Total Knee Arthropalsty on 05/10/2016  Medication History Klor-Con M20 (20MEQ Tablet ER, Oral) Active. Xarelto (20MG  Tablet, Oral) Active. Furosemide (20MG  Tablet, Oral) Active. MetFORMIN HCl (500MG  Tablet, Oral) Active.  Spironolactone (25MG  Tablet, Oral) Active. Atorvastatin Calcium (40MG  Tablet, Oral) Active. Losartan Potassium (25MG  Tablet, Oral) Active.   Past Surgical History  Breast Biopsy  left Breast Mass; Local  Excision  left Gallbladder Surgery  laporoscopic Spinal Decompression  Date: 2015. lower back Total Knee Replacement  Date: 2006. left   Review of Systems  General Not Present- Chills, Fatigue, Fever, Memory Loss, Night Sweats, Weight Gain and Weight Loss. Skin Not Present- Eczema, Hives, Itching, Lesions and Rash. HEENT Present- Dentures (partial). Not Present- Double Vision, Headache, Hearing Loss, Tinnitus and Visual Loss. Respiratory Not Present- Allergies, Chronic Cough, Coughing up blood, Shortness of breath at rest and Shortness of breath with exertion. Cardiovascular Not Present- Chest Pain, Difficulty Breathing Lying Down, Murmur, Palpitations, Racing/skipping heartbeats and Swelling. Gastrointestinal Not Present- Abdominal Pain, Bloody Stool, Constipation, Diarrhea, Difficulty Swallowing, Heartburn, Jaundice, Loss of appetitie, Nausea and Vomiting. Female Genitourinary Present- Urinary frequency. Not Present- Blood in Urine, Discharge, Flank Pain, Incontinence, Painful Urination, Urgency, Urinary Retention, Urinating at Night and Weak urinary stream. Musculoskeletal Present- Back Pain. Not Present- Joint Pain, Joint Swelling, Morning Stiffness, Muscle Pain, Muscle Weakness and Spasms. Neurological Not Present- Blackout spells, Difficulty with balance, Dizziness, Paralysis, Tremor and Weakness. Psychiatric Not Present- Insomnia.  Vitals Weight: 250 lb Height: 66in Weight was reported by patient. Height was reported by patient. Body Surface Area: 2.2 m Body Mass Index: 40.35 kg/m  Pulse: 76 (Regular)  BP: 132/68 (Sitting, Right Arm, Standard)  Physical Exam General Mental Status -Alert, cooperative and good historian. General Appearance-pleasant, Not in acute distress. Orientation-Oriented X3. Build & Nutrition-Well nourished and Well developed.  Head and Neck Head-normocephalic, atraumatic . Neck Global Assessment - supple, no bruit auscultated on  the right, no bruit auscultated on the left.  Eye Pupil - Bilateral-Regular and Round. Motion - Bilateral-EOMI.  Chest and Lung Exam Auscultation Breath sounds - clear at anterior chest wall and clear at posterior chest wall. Adventitious sounds - No Adventitious sounds.  Cardiovascular Auscultation Rhythm - Regular rate and rhythm. Heart Sounds - S1 WNL and S2 WNL. Murmurs & Other Heart Sounds - Auscultation of the heart reveals - No Murmurs.  Abdomen Palpation/Percussion Tenderness - Abdomen is non-tender to palpation. Rigidity (guarding) - Abdomen is soft. Auscultation Auscultation of the abdomen reveals - Bowel sounds normal.  Female Genitourinary Note: Not done, not pertinent to present illness   Musculoskeletal Note: On exam, she is alert and oriented, in no apparent distress. Her right hip shows normal range of motion, no discomfort. Right knee shows no effusion. Range about 5 to 120 with marked crepitus on range of motion. She is tender medially. There is no lateral tenderness or instability. Pulses, sensation and motor are intact.  RADIOGRAPHS Radiographs - AP both knees and lateral showing that she has bone-on-bone arthritis in the medial and patellofemoral compartments of the right knee. Her left knee prosthesis is in good position with no periprosthetic abnormalities.  Assessment & Plan Primary osteoarthritis of right knee (M17.11)  Note:Surgical Plans: Right Total Knee Replacement  Disposition: Home  PCP: Dr. Burnice Logan - Patient has been seen preoperatively and felt to be stable for surgery. Cards: Dr. Radford Pax - Patient has been seen preoperatively and felt to be stable for surgery. "Patient is cleared from cardiac standpoint. Ok to hold Xarelto 2 days prior to surgery."  Topical TXA - Breast Cancer  Anesthesia Issues: None  Signed electronically by Ok Edwards, III PA-C

## 2016-05-10 NOTE — Anesthesia Procedure Notes (Addendum)
Procedure Name: Intubation Date/Time: 05/10/2016 12:15 PM Performed by: Montel Clock Pre-anesthesia Checklist: Patient identified, Emergency Drugs available, Suction available, Patient being monitored and Timeout performed Patient Re-evaluated:Patient Re-evaluated prior to inductionOxygen Delivery Method: Circle system utilized Preoxygenation: Pre-oxygenation with 100% oxygen Intubation Type: IV induction Ventilation: Mask ventilation without difficulty and Oral airway inserted - appropriate to patient size Laryngoscope Size: Mac and 3 Grade View: Grade II Tube type: Oral Tube size: 7.0 mm Number of attempts: 1 Airway Equipment and Method: Stylet Placement Confirmation: ETT inserted through vocal cords under direct vision,  positive ETCO2 and breath sounds checked- equal and bilateral Secured at: 21 cm Tube secured with: Tape Dental Injury: Teeth and Oropharynx as per pre-operative assessment  Comments: Anterior airway (view of arytenoids). ETT easily passed through cords. Recommend Glidescope available (required on prior surgery)    Anesthesia Regional Block:  Adductor canal block  Pre-Anesthetic Checklist: ,, timeout performed, Correct Patient, Correct Site, Correct Laterality, Correct Procedure, Correct Position, site marked, Risks and benefits discussed,  Surgical consent,  Pre-op evaluation,  At surgeon's request and post-op pain management  Laterality: Lower and Right  Prep: chloraprep       Needles:  Injection technique: Single-shot  Needle Type: Echogenic Stimulator Needle          Additional Needles:  Procedures: ultrasound guided (picture in chart) Adductor canal block Narrative:  Injection made incrementally with aspirations every 5 mL.  Performed by: Personally  Anesthesiologist: Madalaine Portier  Additional Notes: H+P and labs reviewed, risks and benefits discussed with patient, procedure tolerated well without complications

## 2016-05-10 NOTE — Anesthesia Postprocedure Evaluation (Signed)
Anesthesia Post Note  Patient: Cynthia Herrera  Procedure(s) Performed: Procedure(s) (LRB): RIGHT TOTAL KNEE ARTHROPLASTY (Right)  Patient location during evaluation: PACU Anesthesia Type: General and Regional Level of consciousness: awake Pain management: pain level controlled Vital Signs Assessment: post-procedure vital signs reviewed and stable Respiratory status: spontaneous breathing Cardiovascular status: stable Postop Assessment: no signs of nausea or vomiting Anesthetic complications: no    Last Vitals:  Filed Vitals:   05/10/16 1445 05/10/16 1455  BP: 151/66 136/62  Pulse: 71 73  Temp: 36.8 C 36.4 C  Resp: 11 14    Last Pain:  Filed Vitals:   05/10/16 1511  PainSc: 0-No pain                 Glessie Eustice

## 2016-05-10 NOTE — Op Note (Signed)
Pre-operative diagnosis- Osteoarthritis  Right knee(s)  Post-operative diagnosis- Osteoarthritis Right knee(s)  Procedure-  Right  Total Knee Arthroplasty  Surgeon- [name]. Treonna Klee, MD  Assistant- Ardeen Jourdain, PA-C   Anesthesia-  General and adductor canal block  EBL-* No blood loss amount entered *   Drains Hemovac  Tourniquet time-  Total Tourniquet Time Documented: Thigh (Right) - 39 minutes Total: Thigh (Right) - 39 minutes     Complications- None  Condition-PACU - hemodynamically stable.   Brief Clinical Note  Cynthia Herrera is a 70 y.o. year old female with end stage OA of her right knee with progressively worsening pain and dysfunction. She has constant pain, with activity and at rest and significant functional deficits with difficulties even with ADLs. She has had extensive non-op management including analgesics, injections of cortisone and viscosupplements, and home exercise program, but remains in significant pain with significant dysfunction.Radiographs show bone on bone arthritis medial and patellofemoral. She presents now for right Total Knee Arthroplasty.     Procedure in detail---   The patient is brought into the operating room and positioned supine on the operating table. After successful administration of  Adductor canal block and general,   a tourniquet is placed high on the  Right thigh(s) and the lower extremity is prepped and draped in the usual sterile fashion. Time out is performed by the operating team and then the  Right lower extremity is wrapped in Esmarch, knee flexed and the tourniquet inflated to 300 mmHg.       A midline incision is made with a ten blade through the subcutaneous tissue to the level of the extensor mechanism. A fresh blade is used to make a medial parapatellar arthrotomy. Soft tissue over the proximal medial tibia is subperiosteally elevated to the joint line with a knife and into the semimembranosus bursa with a Cobb elevator.  Soft tissue over the proximal lateral tibia is elevated with attention being paid to avoiding the patellar tendon on the tibial tubercle. The patella is everted, knee flexed 90 degrees and the ACL and PCL are removed. Findings are bone on bone medial and patellofemoral with large global osteophytes.        The drill is used to create a starting hole in the distal femur and the canal is thoroughly irrigated with sterile saline to remove the fatty contents. The 5 degree Right  valgus alignment guide is placed into the femoral canal and the distal femoral cutting block is pinned to remove 10 mm off the distal femur. Resection is made with an oscillating saw.      The tibia is subluxed forward and the menisci are removed. The extramedullary alignment guide is placed referencing proximally at the medial aspect of the tibial tubercle and distally along the second metatarsal axis and tibial crest. The block is pinned to remove 16mm off the more deficient medial  side. Resection is made with an oscillating saw. Size 4is the most appropriate size for the tibia and the proximal tibia is prepared with the modular drill and keel punch for that size.      The femoral sizing guide is placed and size 4 is most appropriate. Rotation is marked off the epicondylar axis and confirmed by creating a rectangular flexion gap at 90 degrees. The size 4 cutting block is pinned in this rotation and the anterior, posterior and chamfer cuts are made with the oscillating saw. The intercondylar block is then placed and that cut is made.  Trial size 4 tibial component, trial size 4 narrow posterior stabilized femur and a 10  mm posterior stabilized rotating platform insert trial is placed. Full extension is achieved with excellent varus/valgus and anterior/posterior balance throughout full range of motion. The patella is everted and thickness measured to be 25  mm. Free hand resection is taken to 12 mm, a 41 template is placed, lug holes  are drilled, trial patella is placed, and it tracks normally. Osteophytes are removed off the posterior femur with the trial in place. All trials are removed and the cut bone surfaces prepared with pulsatile lavage. Cement is mixed and once ready for implantation, the size 4 tibial implant, size  4 narrow posterior stabilized femoral component, and the size 41 patella are cemented in place and the patella is held with the clamp. The trial insert is placed and the knee held in full extension. The Exparel (20 ml mixed with 30 ml saline) and .25% Bupivicaine, are injected into the extensor mechanism, posterior capsule, medial and lateral gutters and subcutaneous tissues.  All extruded cement is removed and once the cement is hard the permanent 10 mm posterior stabilized rotating platform insert is placed into the tibial tray.      The wound is copiously irrigated with saline solution and the extensor mechanism closed over a hemovac drain with #1 V-loc suture. The tourniquet is released for a total tourniquet time of 39  minutes. Flexion against gravity is 130 degrees and the patella tracks normally. Subcutaneous tissue is closed with 2.0 vicryl and subcuticular with running 4.0 Monocryl. The incision is cleaned and dried and steri-strips and a bulky sterile dressing are applied. The limb is placed into a knee immobilizer and the patient is awakened and transported to recovery in stable condition.      Please note that a surgical assistant was a medical necessity for this procedure in order to perform it in a safe and expeditious manner. Surgical assistant was necessary to retract the ligaments and vital neurovascular structures to prevent injury to them and also necessary for proper positioning of the limb to allow for anatomic placement of the prosthesis.   [name] Maylie Ashton, MD    05/10/2016, 1:24 PM

## 2016-05-10 NOTE — Transfer of Care (Signed)
Immediate Anesthesia Transfer of Care Note  Patient: Cynthia Herrera  Procedure(s) Performed: Procedure(s) with comments: RIGHT TOTAL KNEE ARTHROPLASTY (Right) - With adductor block  Patient Location: PACU  Anesthesia Type:GA combined with regional for post-op pain  Level of Consciousness:  sedated, patient cooperative and responds to stimulation  Airway & Oxygen Therapy:Patient Spontanous Breathing and Patient connected to face mask oxgen  Post-op Assessment:  Report given to PACU RN and Post -op Vital signs reviewed and stable  Post vital signs:  Reviewed and stable  Last Vitals:  Filed Vitals:   05/10/16 0913  BP: 130/61  Pulse: 69  Temp: 36.6 C  Resp: 16    Complications: No apparent anesthesia complications

## 2016-05-10 NOTE — Interval H&P Note (Signed)
History and Physical Interval Note:  05/10/2016 11:22 AM  Cynthia Herrera  has presented today for surgery, with the diagnosis of RIGHT KNEE OA  The various methods of treatment have been discussed with the patient and family. After consideration of risks, benefits and other options for treatment, the patient has consented to  Procedure(s): RIGHT TOTAL KNEE ARTHROPLASTY (Right) as a surgical intervention .  The patient's history has been reviewed, patient examined, no change in status, stable for surgery.  I have reviewed the patient's chart and labs.  Questions were answered to the patient's satisfaction.     Gearlean Alf

## 2016-05-11 LAB — CBC
HEMATOCRIT: 31.9 % — AB (ref 36.0–46.0)
Hemoglobin: 10.6 g/dL — ABNORMAL LOW (ref 12.0–15.0)
MCH: 29.8 pg (ref 26.0–34.0)
MCHC: 33.2 g/dL (ref 30.0–36.0)
MCV: 89.6 fL (ref 78.0–100.0)
Platelets: 284 10*3/uL (ref 150–400)
RBC: 3.56 MIL/uL — ABNORMAL LOW (ref 3.87–5.11)
RDW: 13.8 % (ref 11.5–15.5)
WBC: 14.8 10*3/uL — ABNORMAL HIGH (ref 4.0–10.5)

## 2016-05-11 LAB — GLUCOSE, CAPILLARY
GLUCOSE-CAPILLARY: 131 mg/dL — AB (ref 65–99)
Glucose-Capillary: 151 mg/dL — ABNORMAL HIGH (ref 65–99)
Glucose-Capillary: 216 mg/dL — ABNORMAL HIGH (ref 65–99)
Glucose-Capillary: 144 mg/dL — ABNORMAL HIGH (ref 65–99)

## 2016-05-11 LAB — BASIC METABOLIC PANEL
Anion gap: 5 (ref 5–15)
BUN: 18 mg/dL (ref 6–20)
CO2: 26 mmol/L (ref 22–32)
CREATININE: 0.92 mg/dL (ref 0.44–1.00)
Calcium: 9.2 mg/dL (ref 8.9–10.3)
Chloride: 108 mmol/L (ref 101–111)
GFR calc non Af Amer: >60 mL/min (ref >60)
Glucose, Bld: 181 mg/dL — ABNORMAL HIGH (ref 65–99)
Potassium: 4.2 mmol/L (ref 3.5–5.1)
Sodium: 139 mmol/L (ref 135–145)

## 2016-05-11 MED ORDER — SODIUM CHLORIDE 0.9 % IV BOLUS (SEPSIS)
250.0000 mL | Freq: Once | INTRAVENOUS | Status: AC
  Administered 2016-05-11: 250 mL via INTRAVENOUS

## 2016-05-11 MED ORDER — METHOCARBAMOL 500 MG PO TABS: 500.0000 mg | ORAL_TABLET | Freq: Four times a day (QID) | ORAL | Status: DC | PRN

## 2016-05-11 MED ORDER — HYDROMORPHONE HCL 2 MG PO TABS: 2.0000 mg | ORAL_TABLET | ORAL | Status: DC | PRN

## 2016-05-11 MED ORDER — ONDANSETRON HCL 4 MG PO TABS: 4.0000 mg | ORAL_TABLET | Freq: Four times a day (QID) | ORAL | Status: DC | PRN

## 2016-05-11 MED ORDER — TRAMADOL HCL 50 MG PO TABS: 50.0000 mg | ORAL_TABLET | Freq: Four times a day (QID) | ORAL | Status: DC | PRN

## 2016-05-11 NOTE — Evaluation (Signed)
Occupational Therapy Evaluation Patient Details Name: Cynthia Herrera MRN: [id number] DOB: [birth year] Today's Date: 05/11/2016    History of Present Illness R TKA, h/o L TKA and back surgery   Clinical Impression   This 70 year old female was admitted for the above sx.  All education was completed. No further OT is needed at this time    Follow Up Recommendations  No OT follow up;Supervision/Assistance - 24 hour    Equipment Recommendations  None recommended by OT    Recommendations for Other Services       Precautions / Restrictions Precautions Precautions: Knee Precaution Comments: has done SLR with PT Restrictions Other Position/Activity Restrictions: wbat      Mobility Bed Mobility Overal bed mobility: Modified Independent                Transfers   Equipment used: Rolling walker (2 wheeled) Transfers: Sit to/from Stand Sit to Stand: Supervision              Balance                                            ADL Overall ADL's : Needs assistance/impaired     Grooming: Oral care;Supervision/safety;Standing                   Toilet Transfer: Supervision;Ambulation;BSC;RW   Toileting- Clothing Manipulation and Hygiene: Supervision/safety;Sit to/from stand         General ADL Comments: pt will have assistance as needed for adls.  She has a h/o back sx.  Pt verbalizes understanding of using tub bench--does not feel she needs to practice this     Vision     Perception     Praxis      Pertinent Vitals/Pain Pain Score: 6  Pain Location: R knee Pain Descriptors / Indicators: Sore Pain Intervention(s): Limited activity within patient's tolerance;Monitored during session;Premedicated before session;Repositioned;Ice applied     Hand Dominance     Extremity/Trunk Assessment             Communication Communication Communication: No difficulties   Cognition Arousal/Alertness: Awake/alert Behavior  During Therapy: WFL for tasks assessed/performed Overall Cognitive Status: Within Functional Limits for tasks assessed                     General Comments       Exercises       Shoulder Instructions      Home Living Family/patient expects to be discharged to:: Private residence Living Arrangements: Alone Available Help at Discharge: Family;Available 24 hours/day               Bathroom Shower/Tub: Tub/shower unit Shower/tub characteristics: Curtain Biochemist, clinical: Standard     Home Equipment: Environmental consultant - 2 wheels;Bedside commode;Cane - single point;Tub bench          Prior Functioning/Environment Level of Independence: Independent             OT Diagnosis: Acute pain   OT Problem List:     OT Treatment/Interventions:      OT Goals(Current goals can be found in the care plan section) Acute Rehab OT Goals Patient Stated Goal: to do yardwork, to be able to walk OT Goal Formulation: All assessment and education complete, DC therapy  OT Frequency:     Barriers to D/C:  Co-evaluation              End of Session    Activity Tolerance: Patient tolerated treatment well Patient left: in bed;with call bell/phone within reach (pt requested 4 rails up)   Time: [id number] OT Time Calculation (min): 22 min Charges:  OT General Charges $OT Visit: 1 Procedure OT Evaluation $OT Eval Low Complexity: 1 Procedure G-Codes:    Raliegh Scobie 05-18-16, 1:42 PM  Lesle Chris, OTR/L [phone] 05/18/16

## 2016-05-11 NOTE — Care Management Note (Signed)
Case Management Note  Patient Details  Name: Cynthia Herrera MRN: [id number] Date of Birth: 11/03/1945  Subjective/Objective:                  RIGHT TOTAL KNEE ARTHROPLASTY (Right) Action/Plan: Discharge planning Expected Discharge Date:  05/12/16               Expected Discharge Plan:  Home w Home Health Services  In-House Referral:     Discharge planning Services  CM Consult  Post Acute Care Choice:    Choice offered to:  Patient  DME Arranged:  N/A DME Agency:  NA  HH Arranged:  PT HH Agency:  [facility] (now [facility])  Status of Service:  Completed, signed off  If discussed at Long Length of Stay Meetings, dates discussed:    Additional Comments: CM met with pt in room to offer choice of home health agency.  Pt chooses [facility] to render HHPT.  Referral called to [facility] rep, Tim.  Pt states she has both a rolling walker and a 3n1.  No other CM needs were communicated. Jeffries, Sarah Christine, RN 05/11/2016, 12:41 PM  

## 2016-05-11 NOTE — Evaluation (Signed)
Physical Therapy Evaluation Patient Details Name: Cynthia Herrera MRN: [id number] DOB: September 21, 1946 Today's Date: 05/11/2016   History of Present Illness  R TKA, h/o L TKA and back surgery  Clinical Impression  Pt is s/p TKA resulting in the deficits listed below (see PT Problem List). Pt ambulated 77' with RW with min/guard assist. Performed TKA exercises with min A. Good progress expected.  Pt will benefit from skilled PT to increase their independence and safety with mobility to allow discharge to the venue listed below.      Follow Up Recommendations Home health PT    Equipment Recommendations  None recommended by PT    Recommendations for Other Services OT consult     Precautions / Restrictions Precautions Precautions: Knee Precaution Comments: 1 fall in past year, tripped on rug Required Braces or Orthoses: Knee Immobilizer - Right (able to do SLR independently today) Knee Immobilizer - Right: Discontinue once straight leg raise with < 10 degree lag Restrictions Weight Bearing Restrictions: No Other Position/Activity Restrictions: wbat      Mobility  Bed Mobility Overal bed mobility: Modified Independent             General bed mobility comments: HOB up 30*  Transfers Overall transfer level: Needs assistance Equipment used: Rolling walker (2 wheeled) Transfers: Sit to/from Stand Sit to Stand: Supervision         General transfer comment: VCs hand placement  Ambulation/Gait Ambulation/Gait assistance: Min guard Ambulation Distance (Feet): 60 Feet Assistive device: Rolling walker (2 wheeled) Gait Pattern/deviations: Step-to pattern   Gait velocity interpretation: Below normal speed for age/gender General Gait Details: VCs sequencing, steady with RW, no LOB  Stairs            Wheelchair Mobility    Modified Rankin (Stroke Patients Only)       Balance Overall balance assessment: Modified Independent                                            Pertinent Vitals/Pain Pain Assessment: 0-10 Pain Score: 7  Pain Location: R knee Pain Descriptors / Indicators: Sore Pain Intervention(s): Premedicated before session;Monitored during session;Limited activity within patient's tolerance;Ice applied    Home Living Family/patient expects to be discharged to:: Private residence Living Arrangements: Alone Available Help at Discharge: Family;Available 24 hours/day (family to stay with pt initially) Type of Home: House Home Access: Stairs to enter Entrance Stairs-Rails: None Entrance Stairs-Number of Steps: 1 Home Layout: One level Home Equipment: Walker - 2 wheels;Bedside commode;Cane - single point;Tub bench      Prior Function Level of Independence: Independent               Hand Dominance        Extremity/Trunk Assessment   Upper Extremity Assessment: Overall WFL for tasks assessed           Lower Extremity Assessment: RLE deficits/detail RLE Deficits / Details: 5-45* AAROM R knee, SLR 3/5, ankle WFL    Cervical / Trunk Assessment: Normal  Communication   Communication: No difficulties  Cognition Arousal/Alertness: Awake/alert Behavior During Therapy: WFL for tasks assessed/performed Overall Cognitive Status: Within Functional Limits for tasks assessed                      General Comments      Exercises Total Joint Exercises Ankle Circles/Pumps: AROM;Both;10 reps;Supine Quad Sets:  AROM;Both;10 reps;Supine Heel Slides: AAROM;Right;10 reps;Supine Straight Leg Raises: AROM;Right;5 reps;Supine Long Arc Quad: AROM;Right;5 reps;Seated Goniometric ROM: 5-45* AAROM R knee      Assessment/Plan    PT Assessment Patient needs continued PT services  PT Diagnosis Difficulty walking;Acute pain   PT Problem List Decreased strength;Decreased range of motion;Decreased activity tolerance;Pain;Decreased knowledge of use of DME;Decreased mobility  PT Treatment Interventions Gait  training;Stair training;Functional mobility training;Therapeutic activities;Patient/family education;DME instruction;Therapeutic exercise   PT Goals (Current goals can be found in the Care Plan section) Acute Rehab PT Goals Patient Stated Goal: to do yardwork, to be able to walk PT Goal Formulation: With patient Time For Goal Achievement: 05/18/16    Frequency 7X/week   Barriers to discharge        Co-evaluation               End of Session Equipment Utilized During Treatment: Gait belt Activity Tolerance: Patient tolerated treatment well Patient left: in chair;with call bell/phone within reach Nurse Communication: Mobility status         Time: [id number] PT Time Calculation (min) (ACUTE ONLY): 31 min   Charges:   PT Evaluation $PT Eval Low Complexity: 1 Procedure PT Treatments $Gait Training: 8-22 mins   PT G Codes:        Philomena Doheny 05/11/2016, 9:40 AM [phone]

## 2016-05-11 NOTE — Progress Notes (Signed)
Physical Therapy Treatment Note    05/11/16 1600  PT Visit Information  Last PT Received On 05/11/16  Assistance Needed +1  History of Present Illness R TKA, h/o L TKA and back surgery  Subjective Data  Subjective Pt assisted to [facility] and then ambulated in halllway.  Precautions  Precautions Knee  Restrictions  Other Position/Activity Restrictions WBAT  Pain Assessment  Pain Assessment 0-10  Pain Score 5  Pain Location R knee  Pain Descriptors / Indicators Sore  Pain Intervention(s) Limited activity within patient's tolerance;Monitored during session;Repositioned;Ice applied  Cognition  Arousal/Alertness Awake/alert  Behavior During Therapy WFL for tasks assessed/performed  Overall Cognitive Status Within Functional Limits for tasks assessed  Bed Mobility  Overal bed mobility Modified Independent  Transfers  Overall transfer level Needs assistance  Equipment used Rolling walker (2 wheeled)  Transfers Sit to/from [employer] Transfers  Sit to Stand Supervision  Stand pivot transfers Supervision  General transfer comment verbal cues for safe technique  Ambulation/Gait  Ambulation/Gait assistance Min guard  Ambulation Distance (Feet) 60 Feet  Assistive device Rolling walker (2 wheeled)  Gait Pattern/deviations Step-to pattern;Antalgic;Trunk flexed  General Gait Details verbal cues for step length and RW distance  PT - End of Session  Activity Tolerance Patient tolerated treatment well  Patient left in bed;with call bell/phone within reach;with bed alarm set;with family/visitor present  PT - Assessment/Plan  PT Plan Current plan remains appropriate  PT Frequency (ACUTE ONLY) 7X/week  Follow Up Recommendations Home health PT  PT equipment None recommended by PT  PT Goal Progression  Progress towards PT goals Progressing toward goals  PT Time Calculation  PT Start Time (ACUTE ONLY) 1351  PT Stop Time (ACUTE ONLY) 1409  PT Time Calculation (min) (ACUTE ONLY) 18 min   PT General Charges  $$ ACUTE PT VISIT 1 Procedure  PT Treatments  $Gait Training 8-22 mins   Carmelia Bake, PT, DPT 05/11/2016 Pager: [phone]

## 2016-05-11 NOTE — Discharge Summary (Signed)
Physician Discharge Summary   Patient ID: Cynthia Herrera MRN: [id number] DOB/AGE: 01-Nov-1945 70 y.o.  Admit date: 05/10/2016 Discharge date: 05/12/2016  Primary Diagnosis:  Osteoarthritis  Right knee(s)  Admission Diagnoses:  Past Medical History  Diagnosis Date  . Personal history of colonic polyps     adenomas 03 and 08  . Hyperlipidemia   . Osteopenia   . Vitamin D deficiency     takes Vit D  . Allergy     seasonal  . Arthritis   . Breast cancer ([place])     left: surgery,chemo,radiation  . Atrial fibrillation ([place])     s/p TEE DCCV and repeat DCCV 11/14/2013  . DCM (dilated cardiomyopathy) ([place])     EF 15-20% ? tachycardia induced - EF 50-55% on echo 2015  . Hypertension     takes Losartan and Metoprolol.   . Chronic systolic CHF (congestive heart failure) ([facility])     takes Furosemide and Aldactone daily  . COPD (chronic obstructive pulmonary disease) ([facility])     no inhalers   . History of bronchitis 15+yrs ago  . Joint pain   . Chronic back pain     lumbar stenosis  . Muscle spasm     takes Flexeril daily as needed   . GERD (gastroesophageal reflux disease)     once in a while;depends on what she eats  . Nocturia   . Cataracts, bilateral     immature  . Diabetes mellitus without complication ([place])     recent dx  . Dysrhythmia     a fib   Discharge Diagnoses:   Principal Problem:   OA (osteoarthritis) of knee  Estimated body mass index is 38.99 kg/(m^2) as calculated from the following:   Height as of this encounter: 5' 7" (1.702 m).   Weight as of this encounter: 112.946 kg (249 lb).  Procedure:  Procedure(s) (LRB): RIGHT TOTAL KNEE ARTHROPLASTY (Right)   Consults: None  HPI: Cynthia Herrera is a 70 y.o. year old female with end stage OA of her right knee with progressively worsening pain and dysfunction. She has constant pain, with activity and at rest and significant functional deficits with difficulties even with ADLs. She has had extensive non-op  management including analgesics, injections of cortisone and viscosupplements, and home exercise program, but remains in significant pain with significant dysfunction.Radiographs show bone on bone arthritis medial and patellofemoral. She presents now for right Total Knee Arthroplasty.  Laboratory Data: Admission on 05/10/2016  Component Date Value Ref Range Status  . Glucose-Capillary 05/10/2016 139* 65 - 99 mg/dL Final  . Prothrombin Time 05/10/2016 14.2  11.6 - 15.2 seconds Final  . INR 05/10/2016 1.12  0.00 - 1.49 Final  . Glucose-Capillary 05/10/2016 145* 65 - 99 mg/dL Final  . WBC 05/11/2016 14.8* 4.0 - 10.5 K/uL Final  . RBC 05/11/2016 3.56* 3.87 - 5.11 MIL/uL Final  . Hemoglobin 05/11/2016 10.6* 12.0 - 15.0 g/dL Final  . HCT 05/11/2016 31.9* 36.0 - 46.0 % Final  . MCV 05/11/2016 89.6  78.0 - 100.0 fL Final  . MCH 05/11/2016 29.8  26.0 - 34.0 pg Final  . MCHC 05/11/2016 33.2  30.0 - 36.0 g/dL Final  . RDW 05/11/2016 13.8  11.5 - 15.5 % Final  . Platelets 05/11/2016 284  150 - 400 K/uL Final  . Sodium 05/11/2016 139  135 - 145 mmol/L Final  . Potassium 05/11/2016 4.2  3.5 - 5.1 mmol/L Final  . Chloride 05/11/2016 108  101 -  111 mmol/L Final  . CO2 05/11/2016 26  22 - 32 mmol/L Final  . Glucose, Bld 05/11/2016 181* 65 - 99 mg/dL Final  . BUN 05/11/2016 18  6 - 20 mg/dL Final  . Creatinine, Ser 05/11/2016 0.92  0.44 - 1.00 mg/dL Final  . Calcium 05/11/2016 9.2  8.9 - 10.3 mg/dL Final  . GFR calc non Af Amer 05/11/2016 >60  >60 mL/min Final  . GFR calc Af Amer 05/11/2016 >60  >60 mL/min Final   Comment: (NOTE) The eGFR has been calculated using the CKD EPI equation. This calculation has not been validated in all clinical situations. eGFR's persistently <60 mL/min signify possible Chronic Kidney Disease.   . Anion gap 05/11/2016 5  5 - 15 Final  . Glucose-Capillary 05/10/2016 218* 65 - 99 mg/dL Final  . Glucose-Capillary 05/11/2016 151* 65 - 99 mg/dL Final  . Glucose-Capillary  05/11/2016 216* 65 - 99 mg/dL Final  . Glucose-Capillary 05/11/2016 144* 65 - 99 mg/dL Final  . Glucose-Capillary 05/11/2016 131* 65 - 99 mg/dL Final  Hospital Outpatient Visit on 04/30/2016  Component Date Value Ref Range Status  . MRSA, PCR 04/30/2016 NEGATIVE  NEGATIVE Final  . Staphylococcus aureus 04/30/2016 NEGATIVE  NEGATIVE Final   Comment:        The Xpert SA Assay (FDA approved for NASAL specimens in patients over 71 years of age), is one component of a comprehensive surveillance program.  Test performance has been validated by [facility] for patients greater than or equal to 40 year old. It is not intended to diagnose infection nor to guide or monitor treatment.   Marland Kitchen aPTT 04/30/2016 32  24 - 37 seconds Final  . WBC 04/30/2016 7.9  4.0 - 10.5 K/uL Final  . RBC 04/30/2016 4.18  3.87 - 5.11 MIL/uL Final  . Hemoglobin 04/30/2016 12.4  12.0 - 15.0 g/dL Final  . HCT 04/30/2016 35.9* 36.0 - 46.0 % Final  . MCV 04/30/2016 85.9  78.0 - 100.0 fL Final  . MCH 04/30/2016 29.7  26.0 - 34.0 pg Final  . MCHC 04/30/2016 34.5  30.0 - 36.0 g/dL Final  . RDW 04/30/2016 13.8  11.5 - 15.5 % Final  . Platelets 04/30/2016 304  150 - 400 K/uL Final  . Sodium 04/30/2016 136  135 - 145 mmol/L Final  . Potassium 04/30/2016 3.9  3.5 - 5.1 mmol/L Final  . Chloride 04/30/2016 104  101 - 111 mmol/L Final  . CO2 04/30/2016 24  22 - 32 mmol/L Final  . Glucose, Bld 04/30/2016 124* 65 - 99 mg/dL Final  . BUN 04/30/2016 21* 6 - 20 mg/dL Final  . Creatinine, Ser 04/30/2016 1.09* 0.44 - 1.00 mg/dL Final  . Calcium 04/30/2016 9.2  8.9 - 10.3 mg/dL Final  . Total Protein 04/30/2016 7.7  6.5 - 8.1 g/dL Final  . Albumin 04/30/2016 4.6  3.5 - 5.0 g/dL Final  . AST 04/30/2016 22  15 - 41 U/L Final  . ALT 04/30/2016 21  14 - 54 U/L Final  . Alkaline Phosphatase 04/30/2016 117  38 - 126 U/L Final  . Total Bilirubin 04/30/2016 0.7  0.3 - 1.2 mg/dL Final  . GFR calc non Af Amer 04/30/2016 50* >60 mL/min Final   . GFR calc Af Amer 04/30/2016 58* >60 mL/min Final   Comment: (NOTE) The eGFR has been calculated using the CKD EPI equation. This calculation has not been validated in all clinical situations. eGFR's persistently <60 mL/min signify possible Chronic Kidney Disease.   Marland Kitchen  Anion gap 04/30/2016 8  5 - 15 Final  . Prothrombin Time 04/30/2016 13.6  11.6 - 15.2 seconds Final  . INR 04/30/2016 1.02  0.00 - 1.49 Final  . ABO/RH(D) 04/30/2016 O POS   Final  . Antibody Screen 04/30/2016 NEG   Final  . Sample Expiration 04/30/2016 05/13/2016   Final  . Extend sample reason 04/30/2016 NO TRANSFUSIONS OR PREGNANCY IN THE PAST 3 MONTHS   Final  . Color, Urine 04/30/2016 YELLOW  YELLOW Final  . APPearance 04/30/2016 CLEAR  CLEAR Final  . Specific Gravity, Urine 04/30/2016 1.013  1.005 - 1.030 Final  . pH 04/30/2016 5.5  5.0 - 8.0 Final  . Glucose, UA 04/30/2016 NEGATIVE  NEGATIVE mg/dL Final  . Hgb urine dipstick 04/30/2016 TRACE* NEGATIVE Final  . Bilirubin Urine 04/30/2016 NEGATIVE  NEGATIVE Final  . Ketones, ur 04/30/2016 NEGATIVE  NEGATIVE mg/dL Final  . Protein, ur 04/30/2016 NEGATIVE  NEGATIVE mg/dL Final  . Nitrite 04/30/2016 NEGATIVE  NEGATIVE Final  . Leukocytes, UA 04/30/2016 NEGATIVE  NEGATIVE Final  . Hgb A1c MFr Bld 04/30/2016 5.7* 4.8 - 5.6 % Final   Comment: (NOTE)         Pre-diabetes: 5.7 - 6.4         Diabetes: >6.4         Glycemic control for adults with diabetes: <7.0   . Mean Plasma Glucose 04/30/2016 117   Final   Comment: (NOTE) Performed At: [facility] [place], [place] [id number] Lindon Romp MD TD:[id number]   . ABO/RH(D) 04/30/2016 O POS   Final  . Squamous Epithelial / LPF 04/30/2016 0-5* NONE SEEN Final  . WBC, UA 04/30/2016 0-5  0 - 5 WBC/hpf Final  . RBC / HPF 04/30/2016 0-5  0 - 5 RBC/hpf Final  . Bacteria, UA 04/30/2016 RARE* NONE SEEN Final  . Casts 04/30/2016 HYALINE CASTS* NEGATIVE Final     X-Rays:No results  found.  EKG: Orders placed or performed in visit on 02/12/16  . EKG 12-Lead     Hospital Course: Cynthia Herrera is a 70 y.o. who was admitted to [facility]. They were brought to the operating room on 05/10/2016 and underwent Procedure(s): RIGHT TOTAL KNEE ARTHROPLASTY.  Patient tolerated the procedure well and was later transferred to the recovery room and then to the orthopaedic floor for postoperative care.  They were given PO and IV analgesics for pain control following their surgery.  They were given 24 hours of postoperative antibiotics of  Anti-infectives    Start     Dose/Rate Route Frequency Ordered Stop   05/10/16 1830  ceFAZolin (ANCEF) IVPB 2g/100 mL premix     2 g 200 mL/hr over 30 Minutes Intravenous Every 6 hours 05/10/16 1507 05/11/16 0002   05/10/16 0958  ceFAZolin (ANCEF) IVPB 2g/100 mL premix     2 g 200 mL/hr over 30 Minutes Intravenous On call to O.R. 05/10/16 6468 05/10/16 1220     and started on DVT prophylaxis in the form of Xarelto.   PT and OT were ordered for total joint protocol.  Discharge planning consulted to help with postop disposition and equipment needs.  Patient had a good night on the evening of surgery.  They started to get up OOB with therapy on day one. Hemovac drain was pulled without difficulty.  Continued to work with therapy into day two.  Dressing was changed on day two and the incision was healing well.  Patient was  seen in rounds and was ready to go home.  Discharge home with home health Diet - Cardiac diet Follow up - in 2 weeks Activity - WBAT Disposition - Home Condition Upon Discharge - Good D/C Meds - See DC Summary DVT Prophylaxis - Xarelto10 mg daily while in house and then resume 20 mg at home.  Discharge Instructions    Call MD / Call 911    Complete by:  As directed   If you experience chest pain or shortness of breath, CALL 911 and be transported to the hospital emergency room.  If you develope a fever above 101 F,  pus (white drainage) or increased drainage or redness at the wound, or calf pain, call your surgeon's office.     Change dressing    Complete by:  As directed   Change dressing daily with sterile 4 x 4 inch gauze dressing and apply TED hose. Do not submerge the incision under water.     Constipation Prevention    Complete by:  As directed   Drink plenty of fluids.  Prune juice may be helpful.  You may use a stool softener, such as Colace (over the counter) 100 mg twice a day.  Use MiraLax (over the counter) for constipation as needed.     Diet - low sodium heart healthy    Complete by:  As directed      Diet Carb Modified    Complete by:  As directed      Discharge instructions    Complete by:  As directed   Pick up stool softner and laxative for home use following surgery while on pain medications. Do not submerge incision under water. Please use good hand washing techniques while changing dressing each day. May shower starting three days after surgery. Please use a clean towel to pat the incision dry following showers. Continue to use ice for pain and swelling after surgery. Do not use any lotions or creams on the incision until instructed by your surgeon.  Resume the Xarelto 20 mg at home starting Thursday July 20 th.  Postoperative Constipation Protocol  Constipation - defined medically as fewer than three stools per week and severe constipation as less than one stool per week.  One of the most common issues patients have following surgery is constipation.  Even if you have a regular bowel pattern at home, your normal regimen is likely to be disrupted due to multiple reasons following surgery.  Combination of anesthesia, postoperative narcotics, change in appetite and fluid intake all can affect your bowels.  In order to avoid complications following surgery, here are some recommendations in order to help you during your recovery period.  Colace (docusate) - Pick up an  over-the-counter form of Colace or another stool softener and take twice a day as long as you are requiring postoperative pain medications.  Take with a full glass of water daily.  If you experience loose stools or diarrhea, hold the colace until you stool forms back up.  If your symptoms do not get better within 1 week or if they get worse, check with your doctor.  Dulcolax (bisacodyl) - Pick up over-the-counter and take as directed by the product packaging as needed to assist with the movement of your bowels.  Take with a full glass of water.  Use this product as needed if not relieved by Colace only.   MiraLax (polyethylene glycol) - Pick up over-the-counter to have on hand.  MiraLax is a solution that  will increase the amount of water in your bowels to assist with bowel movements.  Take as directed and can mix with a glass of water, juice, soda, coffee, or tea.  Take if you go more than two days without a movement. Do not use MiraLax more than once per day. Call your doctor if you are still constipated or irregular after using this medication for 7 days in a row.  If you continue to have problems with postoperative constipation, please contact the office for further assistance and recommendations.  If you experience "the worst abdominal pain ever" or develop nausea or vomiting, please contact the office immediatly for further recommendations for treatment.     Do not put a pillow under the knee. Place it under the heel.    Complete by:  As directed      Do not sit on low chairs, stoools or toilet seats, as it may be difficult to get up from low surfaces    Complete by:  As directed      Driving restrictions    Complete by:  As directed   No driving until released by the physician.     Increase activity slowly as tolerated    Complete by:  As directed      Lifting restrictions    Complete by:  As directed   No lifting until released by the physician.     Patient may shower    Complete by:  As  directed   You may shower without a dressing once there is no drainage.  Do not wash over the wound.  If drainage remains, do not shower until drainage stops.     TED hose    Complete by:  As directed   Use stockings (TED hose) for 3 weeks on both leg(s).  You may remove them at night for sleeping.     Weight bearing as tolerated    Complete by:  As directed   Laterality:  right  Extremity:  Lower            Medication List    STOP taking these medications        Vitamin D 2000 units tablet      TAKE these medications        atorvastatin 40 MG tablet  Commonly known as:  LIPITOR  TAKE 1 TABLET (40 MG TOTAL) BY MOUTH AT BEDTIME.     fluticasone 50 MCG/ACT nasal spray  Commonly known as:  FLONASE  Place 1 spray into both nostrils daily as needed for allergies.     furosemide 20 MG tablet  Commonly known as:  LASIX  TAKE 2 TABLETS (40 MG TOTAL) BY MOUTH DAILY.     glucose blood test strip  Commonly known as:  ONETOUCH VERIO  1 each by Other route daily as needed for other.     HYDROmorphone 2 MG tablet  Commonly known as:  DILAUDID  Take 1-2 tablets (2-4 mg total) by mouth every 4 (four) hours as needed for moderate pain or severe pain.     KLOR-CON M20 20 MEQ tablet  Generic drug:  potassium chloride SA  TAKE 1 TABLET (20 MEQ TOTAL) BY MOUTH DAILY.     losartan 25 MG tablet  Commonly known as:  COZAAR  TAKE 1 TABLET TWICE A DAY     metFORMIN 500 MG tablet  Commonly known as:  GLUCOPHAGE  TAKE 1 TABLET TWICE A DAY WITH A MEAL  methocarbamol 500 MG tablet  Commonly known as:  ROBAXIN  Take 1 tablet (500 mg total) by mouth every 6 (six) hours as needed for muscle spasms.     metoprolol succinate 25 MG 24 hr tablet  Commonly known as:  TOPROL-XL  Take 1 tablet (25 mg total) by mouth daily.     ondansetron 4 MG tablet  Commonly known as:  ZOFRAN  Take 1 tablet (4 mg total) by mouth every 6 (six) hours as needed for nausea.     spironolactone 25 MG tablet   Commonly known as:  ALDACTONE  TAKE 1 TABLET (25 MG TOTAL) BY MOUTH DAILY.     traMADol 50 MG tablet  Commonly known as:  ULTRAM  Take 1-2 tablets (50-100 mg total) by mouth every 6 (six) hours as needed (mild pain).     XARELTO 20 MG Tabs tablet  Generic drug:  rivaroxaban  TAKE 1 TABLET (20 MG TOTAL) BY MOUTH DAILY WITH SUPPER.           Follow-up Information    Follow up with [facility].   Why:  home health physical therapy   Contact information:   [place] [id number] [phone]       Follow up with Gearlean Alf, MD. Schedule an appointment as soon as possible for a visit on 05/25/2016.   Specialty:  Orthopedic Surgery   Why:  Call for appointment on Tuesday 05/25/2016 with Dr. Wynelle Link.   Contact information:   [address] [place] [address] [phone]       Signed: Arlee Muslim, PA-C Orthopaedic Surgery 05/11/2016, 9:24 PM

## 2016-05-11 NOTE — Discharge Instructions (Signed)
° °Dr. Frank Aluisio °Total Joint Specialist °[facility] °[address] °[place], [place] [address] °[phone] ° °TOTAL KNEE REPLACEMENT POSTOPERATIVE DIRECTIONS ° °Knee Rehabilitation, Guidelines Following Surgery  °Results after knee surgery are often greatly improved when you follow the exercise, range of motion and muscle strengthening exercises prescribed by your doctor. Safety measures are also important to protect the knee from further injury. Any time any of these exercises cause you to have increased pain or swelling in your knee joint, decrease the amount until you are comfortable again and slowly increase them. If you have problems or questions, call your caregiver or physical therapist for advice.  ° °HOME CARE INSTRUCTIONS  °Remove items at home which could result in a fall. This includes throw rugs or furniture in walking pathways.  °· ICE to the affected knee every three hours for 30 minutes at a time and then as needed for pain and swelling.  Continue to use ice on the knee for pain and swelling from surgery. You may notice swelling that will progress down to the foot and ankle.  This is normal after surgery.  Elevate the leg when you are not up walking on it.   °· Continue to use the breathing machine which will help keep your temperature down.  It is common for your temperature to cycle up and down following surgery, especially at night when you are not up moving around and exerting yourself.  The breathing machine keeps your lungs expanded and your temperature down. °· Do not place pillow under knee, focus on keeping the knee straight while resting ° °DIET °You may resume your previous home diet once your are discharged from the hospital. ° °DRESSING / WOUND CARE / SHOWERING °You may shower 3 days after surgery, but keep the wounds dry during showering.  You may use an occlusive plastic wrap (Press'n Seal for example), NO SOAKING/SUBMERGING IN THE BATHTUB.  If the  bandage gets wet, change with a clean dry gauze.  If the incision gets wet, pat the wound dry with a clean towel. °You may start showering once you are discharged home but do not submerge the incision under water. Just pat the incision dry and apply a dry gauze dressing on daily. °Change the surgical dressing daily and reapply a dry dressing each time. ° °ACTIVITY °Walk with your walker as instructed. °Use walker as long as suggested by your caregivers. °Avoid periods of inactivity such as sitting longer than an hour when not asleep. This helps prevent blood clots.  °You may resume a sexual relationship in one month or when given the OK by your doctor.  °You may return to work once you are cleared by your doctor.  °Do not drive a car for 6 weeks or until released by you surgeon.  °Do not drive while taking narcotics. ° °WEIGHT BEARING °Weight bearing as tolerated with assist device (walker, cane, etc) as directed, use it as long as suggested by your surgeon or therapist, typically at least 4-6 weeks. ° °POSTOPERATIVE CONSTIPATION PROTOCOL °Constipation - defined medically as fewer than three stools per week and severe constipation as less than one stool per week. ° °One of the most common issues patients have following surgery is constipation.  Even if you have a regular bowel pattern at home, your normal regimen is likely to be disrupted due to multiple reasons following surgery.  Combination of anesthesia, postoperative narcotics, change in appetite and fluid intake all can affect your bowels.    In order to avoid complications following surgery, here are some recommendations in order to help you during your recovery period. ° °Colace (docusate) - Pick up an over-the-counter form of Colace or another stool softener and take twice a day as long as you are requiring postoperative pain medications.  Take with a full glass of water daily.  If you experience loose stools or diarrhea, hold the colace until you stool forms  back up.  If your symptoms do not get better within 1 week or if they get worse, check with your doctor. ° °Dulcolax (bisacodyl) - Pick up over-the-counter and take as directed by the product packaging as needed to assist with the movement of your bowels.  Take with a full glass of water.  Use this product as needed if not relieved by Colace only.  ° °MiraLax (polyethylene glycol) - Pick up over-the-counter to have on hand.  MiraLax is a solution that will increase the amount of water in your bowels to assist with bowel movements.  Take as directed and can mix with a glass of water, juice, soda, coffee, or tea.  Take if you go more than two days without a movement. °Do not use MiraLax more than once per day. Call your doctor if you are still constipated or irregular after using this medication for 7 days in a row. ° °If you continue to have problems with postoperative constipation, please contact the office for further assistance and recommendations.  If you experience "the worst abdominal pain ever" or develop nausea or vomiting, please contact the office immediatly for further recommendations for treatment. ° °ITCHING ° If you experience itching with your medications, try taking only a single pain pill, or even half a pain pill at a time.  You can also use Benadryl over the counter for itching or also to help with sleep.  ° °TED HOSE STOCKINGS °Wear the elastic stockings on both legs for three weeks following surgery during the day but you may remove then at night for sleeping. ° °MEDICATIONS °See your medication summary on the “After Visit Summary” that the nursing staff will review with you prior to discharge.  You may have some home medications which will be placed on hold until you complete the course of blood thinner medication.  It is important for you to complete the blood thinner medication as prescribed by your surgeon.  Continue your approved medications as instructed at time of  discharge. ° °PRECAUTIONS °If you experience chest pain or shortness of breath - call 911 immediately for transfer to the hospital emergency department.  °If you develop a fever greater that 101 F, purulent drainage from wound, increased redness or drainage from wound, foul odor from the wound/dressing, or calf pain - CONTACT YOUR SURGEON.   °                                                °FOLLOW-UP APPOINTMENTS °Make sure you keep all of your appointments after your operation with your surgeon and caregivers. You should call the office at the above phone number and make an appointment for approximately two weeks after the date of your surgery or on the date instructed by your surgeon outlined in the "After Visit Summary". ° ° °RANGE OF MOTION AND STRENGTHENING EXERCISES  °Rehabilitation of the knee is important following a knee injury or   an operation. After just a few days of immobilization, the muscles of the thigh which control the knee become weakened and shrink (atrophy). Knee exercises are designed to build up the tone and strength of the thigh muscles and to improve knee motion. Often times heat used for twenty to thirty minutes before working out will loosen up your tissues and help with improving the range of motion but do not use heat for the first two weeks following surgery. These exercises can be done on a training (exercise) mat, on the floor, on a table or on a bed. Use what ever works the best and is most comfortable for you Knee exercises include:  Leg Lifts - While your knee is still immobilized in a splint or cast, you can do straight leg raises. Lift the leg to 60 degrees, hold for 3 sec, and slowly lower the leg. Repeat 10-20 times 2-3 times daily. Perform this exercise against resistance later as your knee gets better.  Quad and Hamstring Sets - Tighten up the muscle on the front of the thigh (Quad) and hold for 5-10 sec. Repeat this 10-20 times hourly. Hamstring sets are done by pushing the  foot backward against an object and holding for 5-10 sec. Repeat as with quad sets.   Leg Slides: Lying on your back, slowly slide your foot toward your buttocks, bending your knee up off the floor (only go as far as is comfortable). Then slowly slide your foot back down until your leg is flat on the floor again.  Angel Wings: Lying on your back spread your legs to the side as far apart as you can without causing discomfort.  A rehabilitation program following serious knee injuries can speed recovery and prevent re-injury in the future due to weakened muscles. Contact your doctor or a physical therapist for more information on knee rehabilitation.   IF YOU ARE TRANSFERRED TO A SKILLED REHAB FACILITY If the patient is transferred to a skilled rehab facility following release from the hospital, a list of the current medications will be sent to the facility for the patient to continue.  When discharged from the skilled rehab facility, please have the facility set up the patient's [place] prior to being released. Also, the skilled facility will be responsible for providing the patient with their medications at time of release from the facility to include their pain medication, the muscle relaxants, and their blood thinner medication. If the patient is still at the rehab facility at time of the two week follow up appointment, the skilled rehab facility will also need to assist the patient in arranging follow up appointment in our office and any transportation needs.  MAKE SURE YOU:  Understand these instructions.  Get help right away if you are not doing well or get worse.    Pick up stool softner and laxative for home use following surgery while on pain medications. Do not submerge incision under water. Please use good hand washing techniques while changing dressing each day. May shower starting three days after surgery. Please use a clean towel to pat the incision dry following  showers. Continue to use ice for pain and swelling after surgery. Do not use any lotions or creams on the incision until instructed by your surgeon.  Resume the Xarelto 20 mg daily at home starting on Thursday July 20 th.

## 2016-05-11 NOTE — Care Management Important Message (Signed)
Important Message  Patient Details  Name: Cynthia Herrera MRN: [id number] Date of Birth: Mar 06, 1946   Medicare Important Message Given:  Yes    Camillo Flaming 05/11/2016, 10:11 AMImportant Message  Patient Details  Name: Cynthia Herrera MRN: [id number] Date of Birth: [birth year]   Medicare Important Message Given:  Yes    Camillo Flaming 05/11/2016, 10:11 AM

## 2016-05-11 NOTE — Progress Notes (Addendum)
Subjective: 1 Day Post-Op Procedure(s) (LRB): RIGHT TOTAL KNEE ARTHROPLASTY (Right) Patient reports pain as mild.   Patient seen in rounds with Dr. Wynelle Link. Patient is well, and has had no acute complaints or problems We will start therapy today.  Plan is to go Home after hospital stay.  Objective: Vital signs in last 24 hours: Temp:  [97.6 F (36.4 C)-98.6 F (37 C)] 97.6 F (36.4 C) (07/18 0542) Pulse Rate:  [67-79] 69 (07/18 0542) Resp:  [11-18] 18 (07/18 0542) BP: (105-151)/(50-73) 117/50 mmHg (07/18 0542) SpO2:  [94 %-100 %] 100 % (07/18 0542) Weight:  [112.946 kg (249 lb)] 112.946 kg (249 lb) (07/17 1455)  Intake/Output from previous day:  Intake/Output Summary (Last 24 hours) at 05/11/16 0716 Last data filed at 05/11/16 [id number]  Gross per 24 hour  Intake 2592.5 ml  Output   3540 ml  Net -947.5 ml    Intake/Output this shift: UOP 1900  Labs:  Recent Labs  05/11/16 0419  HGB 10.6*    Recent Labs  05/11/16 0419  WBC 14.8*  RBC 3.56*  HCT 31.9*  PLT 284    Recent Labs  05/11/16 0419  NA 139  K 4.2  CL 108  CO2 26  BUN 18  CREATININE 0.92  GLUCOSE 181*  CALCIUM 9.2    Recent Labs  05/10/16 1040  INR 1.12    EXAM General - Patient is Alert, Appropriate and Oriented Extremity - Neurovascular intact Sensation intact distally Dorsiflexion/Plantar flexion intact Dressing - dressing C/D/I Motor Function - intact, moving foot and toes well on exam.  Hemovac pulled without difficulty.  Past Medical History  Diagnosis Date  . Personal history of colonic polyps     adenomas 03 and 08  . Hyperlipidemia   . Osteopenia   . Vitamin D deficiency     takes Vit D  . Allergy     seasonal  . Arthritis   . Breast cancer ([place])     left: surgery,chemo,radiation  . Atrial fibrillation ([place])     s/p TEE DCCV and repeat DCCV 11/14/2013  . DCM (dilated cardiomyopathy) ([place])     EF 15-20% ? tachycardia induced - EF 50-55% on echo 2015  .  Hypertension     takes Losartan and Metoprolol.   . Chronic systolic CHF (congestive heart failure) ([facility])     takes Furosemide and Aldactone daily  . COPD (chronic obstructive pulmonary disease) ([facility])     no inhalers   . History of bronchitis 15+yrs ago  . Joint pain   . Chronic back pain     lumbar stenosis  . Muscle spasm     takes Flexeril daily as needed   . GERD (gastroesophageal reflux disease)     once in a while;depends on what she eats  . Nocturia   . Cataracts, bilateral     immature  . Diabetes mellitus without complication ([place])     recent dx  . Dysrhythmia     a fib    Assessment/Plan: 1 Day Post-Op Procedure(s) (LRB): RIGHT TOTAL KNEE ARTHROPLASTY (Right) Principal Problem:   OA (osteoarthritis) of knee  Estimated body mass index is 38.99 kg/(m^2) as calculated from the following:   Height as of this encounter: 5\' 7"  (1.702 m).   Weight as of this encounter: 112.946 kg (249 lb). Advance diet Up with therapy Plan for discharge tomorrow Discharge home with home health PT  DVT Prophylaxis - Xarelto 10 mg daily while in house and then  resume 20 mg at home. Weight-Bearing as tolerated to right leg D/C O2 and Pulse OX and try on Room Air  Arlee Muslim, PA-C Orthopaedic Surgery 05/11/2016, 7:16 AM

## 2016-05-12 LAB — BASIC METABOLIC PANEL
Anion gap: 7 (ref 5–15)
BUN: 23 mg/dL — AB (ref 6–20)
CHLORIDE: 105 mmol/L (ref 101–111)
CO2: 25 mmol/L (ref 22–32)
CREATININE: 0.86 mg/dL (ref 0.44–1.00)
Calcium: 8.7 mg/dL — ABNORMAL LOW (ref 8.9–10.3)
GFR calc Af Amer: >60 mL/min (ref >60)
GFR calc non Af Amer: >60 mL/min (ref >60)
GLUCOSE: 141 mg/dL — AB (ref 65–99)
POTASSIUM: 3.6 mmol/L (ref 3.5–5.1)
SODIUM: 137 mmol/L (ref 135–145)

## 2016-05-12 LAB — CBC
HCT: 28.7 % — ABNORMAL LOW (ref 36.0–46.0)
HEMOGLOBIN: 9.6 g/dL — AB (ref 12.0–15.0)
MCH: 29.8 pg (ref 26.0–34.0)
MCHC: 33.4 g/dL (ref 30.0–36.0)
MCV: 89.1 fL (ref 78.0–100.0)
Platelets: 296 10*3/uL (ref 150–400)
RBC: 3.22 MIL/uL — ABNORMAL LOW (ref 3.87–5.11)
RDW: 14.4 % (ref 11.5–15.5)
WBC: 12.7 10*3/uL — ABNORMAL HIGH (ref 4.0–10.5)

## 2016-05-12 LAB — GLUCOSE, CAPILLARY: Glucose-Capillary: 126 mg/dL — ABNORMAL HIGH (ref 65–99)

## 2016-05-12 NOTE — Progress Notes (Signed)
Subjective: 2 Days Post-Op Procedure(s) (LRB): RIGHT TOTAL KNEE ARTHROPLASTY (Right) Cynthia Herrera reports pain as mild.   Cynthia Herrera seen in rounds with Dr. Wynelle Link. Cynthia Herrera is well, and has had no acute complaints or problems Cynthia Herrera is ready to go home  Objective: Vital signs in last 24 hours: Temp:  [97.6 F (36.4 C)-98.6 F (37 C)] 98.2 F (36.8 C) (07/19 0541) Pulse Rate:  [70-80] 80 (07/19 0541) Resp:  [16-18] 16 (07/19 0541) BP: (114-138)/(48-67) 123/56 mmHg (07/19 0541) SpO2:  [95 %-100 %] 95 % (07/19 0541)  Intake/Output from previous day:  Intake/Output Summary (Last 24 hours) at 05/12/16 0830 Last data filed at 05/12/16 0811  Gross per 24 hour  Intake    960 ml  Output   4900 ml  Net  -3940 ml    Intake/Output this shift: Total I/O In: 120 [P.O.:120] Out: -   Labs:  Recent Labs  05/11/16 0419 05/12/16 0432  HGB 10.6* 9.6*    Recent Labs  05/11/16 0419 05/12/16 0432  WBC 14.8* 12.7*  RBC 3.56* 3.22*  HCT 31.9* 28.7*  PLT 284 296    Recent Labs  05/11/16 0419 05/12/16 0432  NA 139 137  K 4.2 3.6  CL 108 105  CO2 26 25  BUN 18 23*  CREATININE 0.92 0.86  GLUCOSE 181* 141*  CALCIUM 9.2 8.7*    Recent Labs  05/10/16 1040  INR 1.12    EXAM: General - Cynthia Herrera is Alert and Appropriate Extremity - Neurovascular intact Sensation intact distally Incision - clean, dry Motor Function - intact, moving foot and toes well on exam.   Assessment/Plan: 2 Days Post-Op Procedure(s) (LRB): RIGHT TOTAL KNEE ARTHROPLASTY (Right) Procedure(s) (LRB): RIGHT TOTAL KNEE ARTHROPLASTY (Right) Past Medical History  Diagnosis Date  . Personal history of colonic polyps     adenomas 03 and 08  . Hyperlipidemia   . Osteopenia   . Vitamin D deficiency     takes Vit D  . Allergy     seasonal  . Arthritis   . Breast cancer ([place])     left: surgery,chemo,radiation  . Atrial fibrillation ([place])     s/p TEE DCCV and repeat DCCV 11/14/2013  . DCM (dilated  cardiomyopathy) ([place])     EF 15-20% ? tachycardia induced - EF 50-55% on echo 2015  . Hypertension     takes Losartan and Metoprolol.   . Chronic systolic CHF (congestive heart failure) ([facility])     takes Furosemide and Aldactone daily  . COPD (chronic obstructive pulmonary disease) ([facility])     no inhalers   . History of bronchitis 15+yrs ago  . Joint pain   . Chronic back pain     lumbar stenosis  . Muscle spasm     takes Flexeril daily as needed   . GERD (gastroesophageal reflux disease)     once in a while;depends on what she eats  . Nocturia   . Cataracts, bilateral     immature  . Diabetes mellitus without complication ([place])     recent dx  . Dysrhythmia     a fib   Principal Problem:   OA (osteoarthritis) of knee  Estimated body mass index is 38.99 kg/(m^2) as calculated from the following:   Height as of this encounter: 5\' 7"  (1.702 m).   Weight as of this encounter: 112.946 kg (249 lb). Up with therapy Discharge home with home health Diet - Cardiac diet Follow up - in 2 weeks Activity - WBAT  Disposition - Home Condition Upon Discharge - Good D/C Meds - See DC Summary DVT Prophylaxis - Xarelto 10 mg daily while in house and then resume 20 mg at home.  Arlee Muslim, PA-C Orthopaedic Surgery 05/12/2016, 8:30 AM

## 2016-05-12 NOTE — Progress Notes (Signed)
Physical Therapy Treatment Patient Details Name: Cynthia Herrera MRN: [id number] DOB: September 24, 1946 Today's Date: 05/12/2016    History of Present Illness R TKA, h/o L TKA and back surgery    PT Comments    Pt is progressing well with mobility. Stair training complete, pt demonstrates good understanding of HEP. She is ready to DC home from PT standpoint.   Follow Up Recommendations  Home health PT     Equipment Recommendations  None recommended by PT    Recommendations for Other Services OT consult     Precautions / Restrictions Precautions Precautions: Knee Precaution Comments: 1 fall in past year, tripped on rug Required Braces or Orthoses:  (able to do SLR independently today) Restrictions Weight Bearing Restrictions: No Other Position/Activity Restrictions: wbat    Mobility  Bed Mobility               General bed mobility comments: up in chair  Transfers   Equipment used: Rolling walker (2 wheeled)   Sit to Stand: Supervision         General transfer comment: VCs hand placement  Ambulation/Gait Ambulation/Gait assistance: Supervision Ambulation Distance (Feet): 85 Feet Assistive device: Rolling walker (2 wheeled) Gait Pattern/deviations: Step-to pattern   Gait velocity interpretation: Below normal speed for age/gender General Gait Details: steady with RW, no LOB, distance limited by pain/fatigue   Stairs Stairs: Yes Stairs assistance: Min assist Stair Management: No rails;With walker;Backwards;Step to pattern Number of Stairs: 1 General stair comments: daughter present for stair training, min A to manage RW, VCs sequencing  Wheelchair Mobility    Modified Rankin (Stroke Patients Only)       Balance Overall balance assessment: Modified Independent                                  Cognition Arousal/Alertness: Awake/alert Behavior During Therapy: WFL for tasks assessed/performed Overall Cognitive Status: Within  Functional Limits for tasks assessed                      Exercises Total Joint Exercises Ankle Circles/Pumps: AROM;Both;10 reps;Supine Quad Sets: AROM;Both;Supine;5 reps Towel Squeeze: AROM;Both;5 reps Short Arc Quad: AROM;Right;10 reps Heel Slides: AAROM;Right;10 reps;Supine Hip ABduction/ADduction: AAROM;Right;10 reps Straight Leg Raises: AROM;Right;Supine;10 reps;AAROM Long Arc Quad: AROM;Right;5 reps;Seated Knee Flexion: AAROM;AROM;Right;5 reps;Seated Goniometric ROM: 5-55* AAROM R knee    General Comments        Pertinent Vitals/Pain Pain Score: 5  Pain Location: R knee Pain Descriptors / Indicators: Sore Pain Intervention(s): Limited activity within patient's tolerance;Monitored during session;Premedicated before session;Ice applied    Home Living                      Prior Function            PT Goals (current goals can now be found in the care plan section) Acute Rehab PT Goals Patient Stated Goal: to do yardwork, to be able to walk PT Goal Formulation: With patient/family Time For Goal Achievement: 05/18/16 Progress towards PT goals: Progressing toward goals    Frequency  7X/week    PT Plan Current plan remains appropriate    Co-evaluation             End of Session Equipment Utilized During Treatment: Gait belt Activity Tolerance: Patient limited by fatigue;Patient limited by pain Patient left: in chair;with call bell/phone within reach;with family/visitor present     Time:  [id number] PT Time Calculation (min) (ACUTE ONLY): 25 min  Charges:  $Gait Training: 8-22 mins $Therapeutic Exercise: 8-22 mins                    G Codes:      Philomena Doheny 05/12/2016, 10:25 AM [phone]

## 2016-05-13 DIAGNOSIS — Z96653 Presence of artificial knee joint, bilateral: Secondary | ICD-10-CM | POA: Diagnosis not present

## 2016-05-13 DIAGNOSIS — I2510 Atherosclerotic heart disease of native coronary artery without angina pectoris: Secondary | ICD-10-CM | POA: Diagnosis not present

## 2016-05-13 DIAGNOSIS — E1151 Type 2 diabetes mellitus with diabetic peripheral angiopathy without gangrene: Secondary | ICD-10-CM | POA: Diagnosis not present

## 2016-05-13 DIAGNOSIS — I110 Hypertensive heart disease with heart failure: Secondary | ICD-10-CM | POA: Diagnosis not present

## 2016-05-13 DIAGNOSIS — E785 Hyperlipidemia, unspecified: Secondary | ICD-10-CM | POA: Diagnosis not present

## 2016-05-13 DIAGNOSIS — E1136 Type 2 diabetes mellitus with diabetic cataract: Secondary | ICD-10-CM | POA: Diagnosis not present

## 2016-05-13 DIAGNOSIS — I420 Dilated cardiomyopathy: Secondary | ICD-10-CM | POA: Diagnosis not present

## 2016-05-13 DIAGNOSIS — I4891 Unspecified atrial fibrillation: Secondary | ICD-10-CM | POA: Diagnosis not present

## 2016-05-13 DIAGNOSIS — I2584 Coronary atherosclerosis due to calcified coronary lesion: Secondary | ICD-10-CM | POA: Diagnosis not present

## 2016-05-13 DIAGNOSIS — I5042 Chronic combined systolic (congestive) and diastolic (congestive) heart failure: Secondary | ICD-10-CM | POA: Diagnosis not present

## 2016-05-13 DIAGNOSIS — Z853 Personal history of malignant neoplasm of breast: Secondary | ICD-10-CM | POA: Diagnosis not present

## 2016-05-13 DIAGNOSIS — Z471 Aftercare following joint replacement surgery: Secondary | ICD-10-CM | POA: Diagnosis not present

## 2016-05-13 DIAGNOSIS — J449 Chronic obstructive pulmonary disease, unspecified: Secondary | ICD-10-CM | POA: Diagnosis not present

## 2016-05-14 DIAGNOSIS — I2510 Atherosclerotic heart disease of native coronary artery without angina pectoris: Secondary | ICD-10-CM | POA: Diagnosis not present

## 2016-05-14 DIAGNOSIS — Z471 Aftercare following joint replacement surgery: Secondary | ICD-10-CM | POA: Diagnosis not present

## 2016-05-14 DIAGNOSIS — I110 Hypertensive heart disease with heart failure: Secondary | ICD-10-CM | POA: Diagnosis not present

## 2016-05-14 DIAGNOSIS — J449 Chronic obstructive pulmonary disease, unspecified: Secondary | ICD-10-CM | POA: Diagnosis not present

## 2016-05-14 DIAGNOSIS — I420 Dilated cardiomyopathy: Secondary | ICD-10-CM | POA: Diagnosis not present

## 2016-05-14 DIAGNOSIS — E1136 Type 2 diabetes mellitus with diabetic cataract: Secondary | ICD-10-CM | POA: Diagnosis not present

## 2016-05-14 DIAGNOSIS — Z853 Personal history of malignant neoplasm of breast: Secondary | ICD-10-CM | POA: Diagnosis not present

## 2016-05-14 DIAGNOSIS — E785 Hyperlipidemia, unspecified: Secondary | ICD-10-CM | POA: Diagnosis not present

## 2016-05-14 DIAGNOSIS — I4891 Unspecified atrial fibrillation: Secondary | ICD-10-CM | POA: Diagnosis not present

## 2016-05-14 DIAGNOSIS — Z96653 Presence of artificial knee joint, bilateral: Secondary | ICD-10-CM | POA: Diagnosis not present

## 2016-05-14 DIAGNOSIS — I2584 Coronary atherosclerosis due to calcified coronary lesion: Secondary | ICD-10-CM | POA: Diagnosis not present

## 2016-05-14 DIAGNOSIS — E1151 Type 2 diabetes mellitus with diabetic peripheral angiopathy without gangrene: Secondary | ICD-10-CM | POA: Diagnosis not present

## 2016-05-14 DIAGNOSIS — I5042 Chronic combined systolic (congestive) and diastolic (congestive) heart failure: Secondary | ICD-10-CM | POA: Diagnosis not present

## 2016-05-17 DIAGNOSIS — E1136 Type 2 diabetes mellitus with diabetic cataract: Secondary | ICD-10-CM | POA: Diagnosis not present

## 2016-05-17 DIAGNOSIS — J449 Chronic obstructive pulmonary disease, unspecified: Secondary | ICD-10-CM | POA: Diagnosis not present

## 2016-05-17 DIAGNOSIS — I2510 Atherosclerotic heart disease of native coronary artery without angina pectoris: Secondary | ICD-10-CM | POA: Diagnosis not present

## 2016-05-17 DIAGNOSIS — I4891 Unspecified atrial fibrillation: Secondary | ICD-10-CM | POA: Diagnosis not present

## 2016-05-17 DIAGNOSIS — Z471 Aftercare following joint replacement surgery: Secondary | ICD-10-CM | POA: Diagnosis not present

## 2016-05-17 DIAGNOSIS — I5042 Chronic combined systolic (congestive) and diastolic (congestive) heart failure: Secondary | ICD-10-CM | POA: Diagnosis not present

## 2016-05-17 DIAGNOSIS — I110 Hypertensive heart disease with heart failure: Secondary | ICD-10-CM | POA: Diagnosis not present

## 2016-05-17 DIAGNOSIS — Z96653 Presence of artificial knee joint, bilateral: Secondary | ICD-10-CM | POA: Diagnosis not present

## 2016-05-17 DIAGNOSIS — E1151 Type 2 diabetes mellitus with diabetic peripheral angiopathy without gangrene: Secondary | ICD-10-CM | POA: Diagnosis not present

## 2016-05-17 DIAGNOSIS — E785 Hyperlipidemia, unspecified: Secondary | ICD-10-CM | POA: Diagnosis not present

## 2016-05-17 DIAGNOSIS — I2584 Coronary atherosclerosis due to calcified coronary lesion: Secondary | ICD-10-CM | POA: Diagnosis not present

## 2016-05-17 DIAGNOSIS — Z853 Personal history of malignant neoplasm of breast: Secondary | ICD-10-CM | POA: Diagnosis not present

## 2016-05-17 DIAGNOSIS — I420 Dilated cardiomyopathy: Secondary | ICD-10-CM | POA: Diagnosis not present

## 2016-05-18 ENCOUNTER — Encounter: Payer: Self-pay | Admitting: Internal Medicine

## 2016-05-19 DIAGNOSIS — E1151 Type 2 diabetes mellitus with diabetic peripheral angiopathy without gangrene: Secondary | ICD-10-CM | POA: Diagnosis not present

## 2016-05-19 DIAGNOSIS — Z471 Aftercare following joint replacement surgery: Secondary | ICD-10-CM | POA: Diagnosis not present

## 2016-05-19 DIAGNOSIS — I2584 Coronary atherosclerosis due to calcified coronary lesion: Secondary | ICD-10-CM | POA: Diagnosis not present

## 2016-05-19 DIAGNOSIS — I420 Dilated cardiomyopathy: Secondary | ICD-10-CM | POA: Diagnosis not present

## 2016-05-19 DIAGNOSIS — I110 Hypertensive heart disease with heart failure: Secondary | ICD-10-CM | POA: Diagnosis not present

## 2016-05-19 DIAGNOSIS — E785 Hyperlipidemia, unspecified: Secondary | ICD-10-CM | POA: Diagnosis not present

## 2016-05-19 DIAGNOSIS — I4891 Unspecified atrial fibrillation: Secondary | ICD-10-CM | POA: Diagnosis not present

## 2016-05-19 DIAGNOSIS — I2510 Atherosclerotic heart disease of native coronary artery without angina pectoris: Secondary | ICD-10-CM | POA: Diagnosis not present

## 2016-05-19 DIAGNOSIS — Z853 Personal history of malignant neoplasm of breast: Secondary | ICD-10-CM | POA: Diagnosis not present

## 2016-05-19 DIAGNOSIS — I5042 Chronic combined systolic (congestive) and diastolic (congestive) heart failure: Secondary | ICD-10-CM | POA: Diagnosis not present

## 2016-05-19 DIAGNOSIS — E1136 Type 2 diabetes mellitus with diabetic cataract: Secondary | ICD-10-CM | POA: Diagnosis not present

## 2016-05-19 DIAGNOSIS — J449 Chronic obstructive pulmonary disease, unspecified: Secondary | ICD-10-CM | POA: Diagnosis not present

## 2016-05-19 DIAGNOSIS — Z96653 Presence of artificial knee joint, bilateral: Secondary | ICD-10-CM | POA: Diagnosis not present

## 2016-05-20 DIAGNOSIS — Z471 Aftercare following joint replacement surgery: Secondary | ICD-10-CM | POA: Diagnosis not present

## 2016-05-20 DIAGNOSIS — Z853 Personal history of malignant neoplasm of breast: Secondary | ICD-10-CM | POA: Diagnosis not present

## 2016-05-20 DIAGNOSIS — E1151 Type 2 diabetes mellitus with diabetic peripheral angiopathy without gangrene: Secondary | ICD-10-CM | POA: Diagnosis not present

## 2016-05-20 DIAGNOSIS — Z96653 Presence of artificial knee joint, bilateral: Secondary | ICD-10-CM | POA: Diagnosis not present

## 2016-05-20 DIAGNOSIS — J449 Chronic obstructive pulmonary disease, unspecified: Secondary | ICD-10-CM | POA: Diagnosis not present

## 2016-05-20 DIAGNOSIS — I2510 Atherosclerotic heart disease of native coronary artery without angina pectoris: Secondary | ICD-10-CM | POA: Diagnosis not present

## 2016-05-20 DIAGNOSIS — E785 Hyperlipidemia, unspecified: Secondary | ICD-10-CM | POA: Diagnosis not present

## 2016-05-20 DIAGNOSIS — I4891 Unspecified atrial fibrillation: Secondary | ICD-10-CM | POA: Diagnosis not present

## 2016-05-20 DIAGNOSIS — I420 Dilated cardiomyopathy: Secondary | ICD-10-CM | POA: Diagnosis not present

## 2016-05-20 DIAGNOSIS — E1136 Type 2 diabetes mellitus with diabetic cataract: Secondary | ICD-10-CM | POA: Diagnosis not present

## 2016-05-20 DIAGNOSIS — I5042 Chronic combined systolic (congestive) and diastolic (congestive) heart failure: Secondary | ICD-10-CM | POA: Diagnosis not present

## 2016-05-20 DIAGNOSIS — I110 Hypertensive heart disease with heart failure: Secondary | ICD-10-CM | POA: Diagnosis not present

## 2016-05-20 DIAGNOSIS — I2584 Coronary atherosclerosis due to calcified coronary lesion: Secondary | ICD-10-CM | POA: Diagnosis not present

## 2016-05-21 ENCOUNTER — Other Ambulatory Visit: Payer: Self-pay | Admitting: Cardiology

## 2016-05-21 MED ORDER — LOSARTAN POTASSIUM 25 MG PO TABS: 25.0000 mg | ORAL_TABLET | Freq: Two times a day (BID) | ORAL | 2 refills | Status: DC

## 2016-05-24 DIAGNOSIS — M1711 Unilateral primary osteoarthritis, right knee: Secondary | ICD-10-CM | POA: Diagnosis not present

## 2016-05-27 DIAGNOSIS — M1711 Unilateral primary osteoarthritis, right knee: Secondary | ICD-10-CM | POA: Diagnosis not present

## 2016-06-01 DIAGNOSIS — M1711 Unilateral primary osteoarthritis, right knee: Secondary | ICD-10-CM | POA: Diagnosis not present

## 2016-06-03 DIAGNOSIS — M1711 Unilateral primary osteoarthritis, right knee: Secondary | ICD-10-CM | POA: Diagnosis not present

## 2016-06-08 DIAGNOSIS — M1711 Unilateral primary osteoarthritis, right knee: Secondary | ICD-10-CM | POA: Diagnosis not present

## 2016-06-10 DIAGNOSIS — M1711 Unilateral primary osteoarthritis, right knee: Secondary | ICD-10-CM | POA: Diagnosis not present

## 2016-06-14 ENCOUNTER — Encounter: Payer: Self-pay | Admitting: Internal Medicine

## 2016-06-14 ENCOUNTER — Other Ambulatory Visit: Payer: Self-pay | Admitting: Internal Medicine

## 2016-06-14 DIAGNOSIS — R921 Mammographic calcification found on diagnostic imaging of breast: Secondary | ICD-10-CM

## 2016-06-15 DIAGNOSIS — Z96651 Presence of right artificial knee joint: Secondary | ICD-10-CM | POA: Diagnosis not present

## 2016-06-15 DIAGNOSIS — M1711 Unilateral primary osteoarthritis, right knee: Secondary | ICD-10-CM | POA: Diagnosis not present

## 2016-06-15 DIAGNOSIS — Z471 Aftercare following joint replacement surgery: Secondary | ICD-10-CM | POA: Diagnosis not present

## 2016-07-09 ENCOUNTER — Ambulatory Visit
Admission: RE | Admit: 2016-07-09 | Discharge: 2016-07-09 | Disposition: A | Discharge status: Home / Self Care | Payer: Medicare Other | Source: Ambulatory Visit | Attending: Internal Medicine | Admitting: Internal Medicine

## 2016-07-09 DIAGNOSIS — R921 Mammographic calcification found on diagnostic imaging of breast: Secondary | ICD-10-CM | POA: Diagnosis not present

## 2016-07-11 ENCOUNTER — Other Ambulatory Visit: Payer: Self-pay | Admitting: Cardiology

## 2016-07-12 DIAGNOSIS — Z961 Presence of intraocular lens: Secondary | ICD-10-CM | POA: Diagnosis not present

## 2016-07-12 DIAGNOSIS — H40013 Open angle with borderline findings, low risk, bilateral: Secondary | ICD-10-CM | POA: Diagnosis not present

## 2016-07-12 DIAGNOSIS — E119 Type 2 diabetes mellitus without complications: Secondary | ICD-10-CM | POA: Diagnosis not present

## 2016-07-12 DIAGNOSIS — H35362 Drusen (degenerative) of macula, left eye: Secondary | ICD-10-CM | POA: Diagnosis not present

## 2016-07-12 LAB — HM DIABETES EYE EXAM

## 2016-07-29 ENCOUNTER — Encounter: Payer: Self-pay | Admitting: Internal Medicine

## 2016-07-31 ENCOUNTER — Other Ambulatory Visit: Payer: Self-pay | Admitting: Internal Medicine

## 2016-08-11 ENCOUNTER — Encounter: Payer: Self-pay | Admitting: Cardiology

## 2016-08-11 ENCOUNTER — Ambulatory Visit (INDEPENDENT_AMBULATORY_CARE_PROVIDER_SITE_OTHER): Payer: Medicare Other | Admitting: Cardiology

## 2016-08-11 VITALS — BP 102/64 | HR 73 | Ht 66.0 in | Wt 242.1 lb

## 2016-08-11 DIAGNOSIS — I10 Essential (primary) hypertension: Secondary | ICD-10-CM | POA: Diagnosis not present

## 2016-08-11 DIAGNOSIS — I2510 Atherosclerotic heart disease of native coronary artery without angina pectoris: Secondary | ICD-10-CM | POA: Diagnosis not present

## 2016-08-11 DIAGNOSIS — I420 Dilated cardiomyopathy: Secondary | ICD-10-CM | POA: Diagnosis not present

## 2016-08-11 DIAGNOSIS — I5042 Chronic combined systolic (congestive) and diastolic (congestive) heart failure: Secondary | ICD-10-CM

## 2016-08-11 DIAGNOSIS — I4819 Other persistent atrial fibrillation: Secondary | ICD-10-CM

## 2016-08-11 DIAGNOSIS — E785 Hyperlipidemia, unspecified: Secondary | ICD-10-CM

## 2016-08-11 DIAGNOSIS — I481 Persistent atrial fibrillation: Secondary | ICD-10-CM

## 2016-08-11 MED ORDER — NEBIVOLOL HCL 2.5 MG PO TABS: 2.5000 mg | ORAL_TABLET | Freq: Every day | ORAL | 11 refills | Status: DC

## 2016-08-11 NOTE — Patient Instructions (Signed)
Medication Instructions:  1) STOP TOPROL 2) START BYSTOLIC 2.5 mg daily  Labwork: None  Testing/Procedures: None  Follow-Up: Your physician recommends that you schedule a follow-up appointment in 2 weeks with the [facility] CL[facility].  Your physician wants you to follow-up in: 6 months with Dr. Radford Pax. You will receive a reminder letter in the mail two months in advance. If you don't receive a letter, please call our office to schedule the follow-up appointment.   Any Other Special Instructions Will Be Listed Below (If Applicable).     If you need a refill on your cardiac medications before your next appointment, please call your pharmacy.

## 2016-08-11 NOTE — Progress Notes (Signed)
Cardiology Office Note    Date:  08/11/2016   ID:  Cynthia Herrera, DOB November 17, 1945, MRN [id number]  PCP:  Nyoka Cowden, MD  Cardiologist:  [name], MD   Chief Complaint  Patient presents with  . Coronary Artery Disease  . Hypertension  . Atrial Fibrillation  . Hyperlipidemia  . Congestive Heart Failure    History of Present Illness:  Cynthia Herrera is a 70 y.o. female with a history of ASCAD (coronary artery calcifications on CT scan and on cath with no obstructive lesions), nonischemic dilated CM EF now 50-55%, HTN, dyslipidemia, chronic anticoagulation, chronic systolic CHF and persistent atrial fibrillation s/p TEE/DCCV to NSR but failed to hold NSR. She was again cardioverted on 1/21 after more loading with amio but failed to convert. Later that day she spontaneously converted to NSR. She stopped amio due to severe dizziness. She has been maintaining NSR.  She denies any chest pain, palpitations, dizziness or syncope.Occasionally she will have some LE edema but very well controlled. Her dizziness resolved off amio. She says that she has lost some weight but still feels fatigued which started after placing on Toprol.  She has some very mild SOB that does not really bother her much.    Past Medical History:  Diagnosis Date  . Allergy    seasonal  . Arthritis   . Breast cancer ([place])    left: surgery,chemo,radiation  . Cataracts, bilateral    immature  . Chronic back pain    lumbar stenosis  . Chronic systolic CHF (congestive heart failure) (HCC)    takes Furosemide and Aldactone daily  . COPD (chronic obstructive pulmonary disease) (HCC)    no inhalers   . DCM (dilated cardiomyopathy) ([place])    EF 15-20% ? tachycardia induced - EF 50-55% on echo 2015  . Diabetes mellitus without complication ([place])    recent dx  . GERD (gastroesophageal reflux disease)    once in a while;depends on what she eats  . History of bronchitis 15+yrs ago  .  Hyperlipidemia   . Hypertension    takes Losartan and Metoprolol.   . Joint pain   . Muscle spasm    takes Flexeril daily as needed   . Nocturia   . Osteopenia   . Persistent atrial fibrillation ([place])    s/p TEE DCCV and repeat DCCV 11/14/2013  . Personal history of colonic polyps    adenomas 03 and 08  . Vitamin D deficiency    takes Vit D    Past Surgical History:  Procedure Laterality Date  . BACK SURGERY    . BREAST LUMPECTOMY  2002  . CARDIAC CATHETERIZATION  2015  . CARDIOVERSION N/A 11/12/2013   Procedure: CARDIOVERSION;  Surgeon: Thayer Headings, MD;  [facility] ENDOSCOPY;  Service: Cardiovascular;  Laterality: N/A;  10:08  Dr. Marissa Nestle, anesthesia present, Lido   60mg ,  propofol 50mg , IV for elective cardioversion....Dr. Cathie Olden delievered synch 120 joules with successful cardioversion to NSR  . CARDIOVERSION N/A 11/12/2013   Procedure: CARDIOVERSION;  Surgeon: Thayer Headings, MD;  Location: [place];  Service: Cardiovascular;  Laterality: N/A;  . CARDIOVERSION N/A 11/14/2013   Procedure: CARDIOVERSION (BEDSIDE);  Surgeon: Larey Dresser, MD;  Location: [place];  Service: Cardiovascular;  Laterality: N/A;  . CHOLECYSTECTOMY  2004  . COLONOSCOPY  2003, September 2008, April 01, 2011   adenomas 03 and 08, polyp 12, diverticulosis  . JOINT REPLACEMENT     totally replacement surgery  .  LEFT AND RIGHT HEART CATHETERIZATION WITH CORONARY ANGIOGRAM N/A 02/01/2014   Procedure: LEFT AND RIGHT HEART CATHETERIZATION WITH CORONARY ANGIOGRAM;  Surgeon: Sueanne Margarita, MD;  Location: [place] CATH LAB;  Service: Cardiovascular;  Laterality: N/A;  . LUMBAR LAMINECTOMY/DECOMPRESSION MICRODISCECTOMY N/A 03/19/2014   Procedure: LUMBAR LAMINECTOMY/DECOMPRESSION MICRODISCECTOMY 4 LEVEL;  Surgeon: Kristeen Miss, MD;  Location: [place] NEURO ORS;  Service: Neurosurgery;  Laterality: N/A;  L1-2 L2-3 L3-4 L4-5 Laminectomy/Foraminotomy  . MASTECTOMY  July 2002   left  . port a cath placed    . PORT-A-CATH  REMOVAL    . TEE WITHOUT CARDIOVERSION N/A 11/12/2013   Procedure: TRANSESOPHAGEAL ECHOCARDIOGRAM (TEE);  Surgeon: Thayer Headings, MD;  Location: [place];  Service: Cardiovascular;  Laterality: N/A;  pt b/p low, pt buccal membranes very dry, lips scapped, pt c/o thirst. NPO since MN and iv FLUIDS TOTAL INFUSING AT TOTAL 20ML HR....  Dr. Cathie Olden order allow NS to bolus during procedure....very dry,NS bolus 250 ml total..pt responding well to meds..  . TOTAL KNEE ARTHROPLASTY  2006  . TOTAL KNEE ARTHROPLASTY Right 05/10/2016   Procedure: RIGHT TOTAL KNEE ARTHROPLASTY;  Surgeon: Gaynelle Arabian, MD;  Location: [facility];  Service: Orthopedics;  Laterality: Right;  With adductor block  . TUBAL LIGATION      Current Medications: Outpatient Medications Prior to Visit  Medication Sig Dispense Refill  . atorvastatin (LIPITOR) 40 MG tablet TAKE 1 TABLET (40 MG TOTAL) BY MOUTH AT BEDTIME. 90 tablet 3  . fluticasone (FLONASE) 50 MCG/ACT nasal spray Place 1 spray into both nostrils daily as needed for allergies.    . furosemide (LASIX) 20 MG tablet TAKE 2 TABLETS (40 MG TOTAL) BY MOUTH DAILY. 180 tablet 3  . glucose blood (ONETOUCH VERIO) test strip 1 each by Other route daily as needed for other. 100 each 12  . KLOR-CON M20 20 MEQ tablet TAKE 1 TABLET (20 MEQ TOTAL) BY MOUTH DAILY. 180 tablet 3  . losartan (COZAAR) 25 MG tablet Take 1 tablet (25 mg total) by mouth 2 (two) times daily. 180 tablet 2  . metFORMIN (GLUCOPHAGE) 500 MG tablet TAKE 1 TABLET TWICE A DAY WITH A MEAL 180 tablet 1  . methocarbamol (ROBAXIN) 500 MG tablet Take 1 tablet (500 mg total) by mouth every 6 (six) hours as needed for muscle spasms. 90 tablet 0  . ondansetron (ZOFRAN) 4 MG tablet Take 1 tablet (4 mg total) by mouth every 6 (six) hours as needed for nausea. 40 tablet 0  . spironolactone (ALDACTONE) 25 MG tablet TAKE 1 TABLET (25 MG TOTAL) BY MOUTH DAILY. 90 tablet 3  . XARELTO 20 MG TABS tablet TAKE 1 TABLET (20 MG TOTAL) BY  MOUTH DAILY WITH SUPPER. 30 tablet 6  . metoprolol succinate (TOPROL-XL) 25 MG 24 hr tablet Take 1 tablet (25 mg total) by mouth daily. 30 tablet 11  . HYDROmorphone (DILAUDID) 2 MG tablet Take 1-2 tablets (2-4 mg total) by mouth every 4 (four) hours as needed for moderate pain or severe pain. 90 tablet 0  . traMADol (ULTRAM) 50 MG tablet Take 1-2 tablets (50-100 mg total) by mouth every 6 (six) hours as needed (mild pain). 80 tablet 1   No facility-administered medications prior to visit.      Allergies:   Codeine phosphate; Ambien [zolpidem]; and Medrol [methylprednisolone]   Social History   Social History  . Marital status: Widowed    Spouse name: N/A  . Number of children: N/A  . Years of education:  N/A   Social History Main Topics  . Smoking status: Former Smoker    Quit date: 08/24/2001  . Smokeless tobacco: Never Used  . Alcohol use No  . Drug use: No  . Sexual activity: Not Asked   Other Topics Concern  . None   Social History Narrative  . None     Family History:  The patient's family history includes Cancer in her mother; Dementia in her sister; Diabetes in her brother, father, mother, and sister; Heart disease in her brother and father; Liver cancer in her mother.   ROS:   Please see the history of present illness.    ROS All other systems reviewed and are negative.  No flowsheet data found.     PHYSICAL EXAM:   VS:  BP 102/64   Pulse 73   Ht 5\' 6"  (1.676 m)   Wt 242 lb 1.9 oz (109.8 kg)   SpO2 97%   BMI 39.08 kg/m    GEN: Well nourished, well developed, in no acute distress  HEENT: normal  Neck: no JVD, carotid bruits, or masses Cardiac: RRR; no murmurs, rubs, or gallops,no edema.  Intact distal pulses bilaterally.  Respiratory:  clear to auscultation bilaterally, normal work of breathing GI: soft, nontender, nondistended, + BS MS: no deformity or atrophy  Skin: warm and dry, no rash Neuro:  Alert and Oriented x 3, Strength and sensation are  intact Psych: euthymic mood, full affect  Wt Readings from Last 3 Encounters:  08/11/16 242 lb 1.9 oz (109.8 kg)  05/10/16 249 lb (112.9 kg)  05/03/16 249 lb (112.9 kg)      Studies/Labs Reviewed:   EKG:  EKG is ordered today.  The ekg ordered today demonstrates NSR with PVCs and nonspecific ST abnormality  Recent Labs: 11/06/2015: TSH 0.76 04/30/2016: ALT 21 05/12/2016: BUN 23; Creatinine, Ser 0.86; Hemoglobin 9.6; Platelets 296; Potassium 3.6; Sodium 137   Lipid Panel    Component Value Date/Time   CHOL 129 11/06/2015 0959   TRIG 116.0 11/06/2015 0959   HDL 40.90 11/06/2015 0959   CHOLHDL 3 11/06/2015 0959   VLDL 23.2 11/06/2015 0959   LDLCALC 65 11/06/2015 0959    Additional studies/ records that were reviewed today include:  none    ASSESSMENT:    1. Coronary artery calcification seen on CAT scan   2. Essential hypertension   3. DCM (dilated cardiomyopathy) ([place])   4. Chronic combined systolic and diastolic CHF (congestive heart failure) ([place])   5. Dyslipidemia   6. Persistent atrial fibrillation (HCC)      PLAN:  In order of problems listed above:  1. ASCAD with coronary artery calcifications noted on chest CT but no significant CAD on cath.  Continue statin. No ASA due to NOAC. 2. HTN - BP controlled on current meds.  Continue ARB/BB and aldactone. 3. DCM - EF 50-55% on echo 08/2014 4. Chronic combined systolic/diastolic CHF - appears euvolemic on exam today.  Continue BB/ARB and diuretic.  5. Dyslipidemia - LDL goal < 70.  Continue statin and check FLP and ALT.   6. Persistent atrial fibrillation maintaining NSR - continue Xarelto and BB.  CBC and BMET stable from 04/2016.  She complains of fatigue since going on Toprol so I will change her to Bystolic 2.5mg  daily to see if she has less side effects.    Medication Adjustments/Labs and Tests Ordered: Current medicines are reviewed at length with the patient today.  Concerns regarding medicines are outlined  above.  Medication changes, Labs and Tests ordered today are listed in the Patient Instructions below.  Patient Instructions  Medication Instructions:  1) STOP TOPROL 2) START BYSTOLIC 2.5 mg daily  Labwork: None  Testing/Procedures: None  Follow-Up: Your physician recommends that you schedule a follow-up appointment in 2 weeks with the HTN CLINIC.  Your physician wants you to follow-up in: 6 months with Dr. Radford Pax. You will receive a reminder letter in the mail two months in advance. If you don't receive a letter, please call our office to schedule the follow-up appointment.   Any Other Special Instructions Will Be Listed Below (If Applicable).     If you need a refill on your cardiac medications before your next appointment, please call your pharmacy.      Signed, [name], MD  08/11/2016 2:05 PM    [facility] [place], [place], [place]  [id number] Phone: [phone]; Fax: [phone]

## 2016-08-23 ENCOUNTER — Telehealth: Payer: Self-pay

## 2016-08-23 ENCOUNTER — Ambulatory Visit (INDEPENDENT_AMBULATORY_CARE_PROVIDER_SITE_OTHER): Payer: Medicare Other | Admitting: Internal Medicine

## 2016-08-23 ENCOUNTER — Encounter: Payer: Self-pay | Admitting: Internal Medicine

## 2016-08-23 VITALS — BP 118/60 | HR 76 | Ht 64.5 in | Wt 245.0 lb

## 2016-08-23 DIAGNOSIS — Z86010 Personal history of colonic polyps: Secondary | ICD-10-CM | POA: Diagnosis not present

## 2016-08-23 DIAGNOSIS — Z7901 Long term (current) use of anticoagulants: Secondary | ICD-10-CM | POA: Diagnosis not present

## 2016-08-23 DIAGNOSIS — I481 Persistent atrial fibrillation: Secondary | ICD-10-CM | POA: Diagnosis not present

## 2016-08-23 DIAGNOSIS — I4819 Other persistent atrial fibrillation: Secondary | ICD-10-CM

## 2016-08-23 DIAGNOSIS — T884XXD Failed or difficult intubation, subsequent encounter: Secondary | ICD-10-CM

## 2016-08-23 NOTE — Telephone Encounter (Signed)
OK to hold xarelto for 48 hour prior to procedure

## 2016-08-23 NOTE — Progress Notes (Signed)
Cynthia Herrera 70 y.o. 1945-11-03 [id number]  Assessment & Plan:   Encounter Diagnoses  Name Primary?  . History of colonic polyps Yes  . Chronic anticoagulation   . Persistent atrial fibrillation ([place])   . Difficult airway for intubation, subsequent encounter     It is reasonable to pursue a repeat colonoscopy given her history of adenomatous colon polyps. It would be best if her Xarelto were held for 1 or 2 days before moving will clarify with her cardiologist about that though recommend that and just see confirmation. I have explained to her the extra risk of possible stroke off her Xarelto she's been off it before and she is aware of this. Other endoscopic risks are reviewed as well. Because of her history of needing a glide scope for previous intubations she should have her procedure at the hospital where there is a greater safety backup.  I appreciate the opportunity to care for this patient.  Subjective:   Chief Complaint: hx colon polyps  HPI Pleasant 70 year old white woman with a history of adenomatous polyps, have been found and removed on multiple colonoscopies over the years in 5 years ago there was a small adenoma removed. She is now on Xarelto for atrial fibrillation stroke prophylaxis. Followed by Dr. Radford Pax.  This past year she had a knee replacement and about 3 months ago this took place and she had some bleeding after that but otherwise has been coming along well with that. Allergies  Allergen Reactions  . Codeine Phosphate Nausea And Vomiting  . Ambien [Zolpidem] Other (See Comments)    ? Hallucinations, up walking around  . Medrol [Methylprednisolone] Other (See Comments)    Felt really weird w the high dose oral steroid, but tolerates low doses or oral steroid   Outpatient Medications Prior to Visit  Medication Sig Dispense Refill  . atorvastatin (LIPITOR) 40 MG tablet TAKE 1 TABLET (40 MG TOTAL) BY MOUTH AT BEDTIME. 90 tablet 3  . fluticasone  (FLONASE) 50 MCG/ACT nasal spray Place 1 spray into both nostrils daily as needed for allergies.    . furosemide (LASIX) 20 MG tablet TAKE 2 TABLETS (40 MG TOTAL) BY MOUTH DAILY. 180 tablet 3  . glucose blood (ONETOUCH VERIO) test strip 1 each by Other route daily as needed for other. 100 each 12  . KLOR-CON M20 20 MEQ tablet TAKE 1 TABLET (20 MEQ TOTAL) BY MOUTH DAILY. 180 tablet 3  . losartan (COZAAR) 25 MG tablet Take 1 tablet (25 mg total) by mouth 2 (two) times daily. 180 tablet 2  . metFORMIN (GLUCOPHAGE) 500 MG tablet TAKE 1 TABLET TWICE A DAY WITH A MEAL 180 tablet 1  . methocarbamol (ROBAXIN) 500 MG tablet Take 1 tablet (500 mg total) by mouth every 6 (six) hours as needed for muscle spasms. 90 tablet 0  . nebivolol (BYSTOLIC) 2.5 MG tablet Take 1 tablet (2.5 mg total) by mouth daily. 30 tablet 11  . spironolactone (ALDACTONE) 25 MG tablet TAKE 1 TABLET (25 MG TOTAL) BY MOUTH DAILY. 90 tablet 3  . XARELTO 20 MG TABS tablet TAKE 1 TABLET (20 MG TOTAL) BY MOUTH DAILY WITH SUPPER. 30 tablet 6  . ondansetron (ZOFRAN) 4 MG tablet Take 1 tablet (4 mg total) by mouth every 6 (six) hours as needed for nausea. 40 tablet 0   No facility-administered medications prior to visit.    Past Medical History:  Diagnosis Date  . Allergy    seasonal  . Arthritis   .  Breast cancer ([place])    left: surgery,chemo,radiation  . Cataracts, bilateral    immature  . Chronic back pain    lumbar stenosis  . Chronic systolic CHF (congestive heart failure) (HCC)    takes Furosemide and Aldactone daily  . COPD (chronic obstructive pulmonary disease) (HCC)    no inhalers   . DCM (dilated cardiomyopathy) ([place])    EF 15-20% ? tachycardia induced - EF 50-55% on echo 2015  . Diabetes mellitus without complication ([place])    recent dx  . Gallstones   . GERD (gastroesophageal reflux disease)    once in a while;depends on what she eats  . History of bronchitis 15+yrs ago  . Hyperlipidemia   . Hypertension     takes Losartan and Metoprolol.   . Joint pain   . Muscle spasm    takes Flexeril daily as needed   . Nocturia   . Osteopenia   . Persistent atrial fibrillation ([place])    s/p TEE DCCV and repeat DCCV 11/14/2013  . Personal history of colonic polyps    adenomas 03 and 08  . Vitamin D deficiency    takes Vit D   Past Surgical History:  Procedure Laterality Date  . BACK SURGERY    . BREAST LUMPECTOMY  2002  . CARDIAC CATHETERIZATION  2015  . CARDIOVERSION N/A 11/12/2013   Procedure: CARDIOVERSION;  Surgeon: Thayer Headings, MD;  Location: [facility] ENDOSCOPY;  Service: Cardiovascular;  Laterality: N/A;  10:08  Dr. Marissa Nestle, anesthesia present, Lido   60mg ,  propofol 50mg , IV for elective cardioversion....Dr. Cathie Olden delievered synch 120 joules with successful cardioversion to NSR  . CARDIOVERSION N/A 11/12/2013   Procedure: CARDIOVERSION;  Surgeon: Thayer Headings, MD;  Location: [place];  Service: Cardiovascular;  Laterality: N/A;  . CARDIOVERSION N/A 11/14/2013   Procedure: CARDIOVERSION (BEDSIDE);  Surgeon: Larey Dresser, MD;  Location: [place];  Service: Cardiovascular;  Laterality: N/A;  . CHOLECYSTECTOMY  2004  . COLONOSCOPY  2003, September 2008, April 01, 2011   adenomas 03 and 08, polyp 12, diverticulosis  . JOINT REPLACEMENT     totally replacement surgery  . LEFT AND RIGHT HEART CATHETERIZATION WITH CORONARY ANGIOGRAM N/A 02/01/2014   Procedure: LEFT AND RIGHT HEART CATHETERIZATION WITH CORONARY ANGIOGRAM;  Surgeon: Sueanne Margarita, MD;  Location: [place] CATH LAB;  Service: Cardiovascular;  Laterality: N/A;  . LUMBAR LAMINECTOMY/DECOMPRESSION MICRODISCECTOMY N/A 03/19/2014   Procedure: LUMBAR LAMINECTOMY/DECOMPRESSION MICRODISCECTOMY 4 LEVEL;  Surgeon: Kristeen Miss, MD;  Location: [place] NEURO ORS;  Service: Neurosurgery;  Laterality: N/A;  L1-2 L2-3 L3-4 L4-5 Laminectomy/Foraminotomy  . MASTECTOMY  July 2002   left  . port a cath placed    . PORT-A-CATH REMOVAL    . TEE WITHOUT CARDIOVERSION  N/A 11/12/2013   Procedure: TRANSESOPHAGEAL ECHOCARDIOGRAM (TEE);  Surgeon: Thayer Headings, MD;  Location: [place];  Service: Cardiovascular;  Laterality: N/A;  pt b/p low, pt buccal membranes very dry, lips scapped, pt c/o thirst. NPO since MN and iv FLUIDS TOTAL INFUSING AT TOTAL 20ML HR....  Dr. Cathie Olden order allow NS to bolus during procedure....very dry,NS bolus 250 ml total..pt responding well to meds..  . TOTAL KNEE ARTHROPLASTY  2006  . TOTAL KNEE ARTHROPLASTY Right 05/10/2016   Procedure: RIGHT TOTAL KNEE ARTHROPLASTY;  Surgeon: Gaynelle Arabian, MD;  [facility];  Service: Orthopedics;  Laterality: Right;  With adductor block  . TUBAL LIGATION     Social History   Social History  . Marital status: Widowed  Spouse name: N/A  . Number of children: 2  . Years of education: N/A   Occupational History  . retired    Social History Main Topics  . Smoking status: Former Smoker    Quit date: 08/24/2001  . Smokeless tobacco: Never Used  . Alcohol use No  . Drug use: No  . Sexual activity: Not Asked   Other Topics Concern  . None   Social History Narrative  . None   Family History  Problem Relation Age of Onset  . Dementia Sister   . Diabetes Sister   . Alzheimer's disease Sister   . Diabetes Brother     x 2  . Heart disease Brother   . Irritable bowel syndrome Brother   . Liver cancer Mother   . Diabetes Mother   . Pancreatic cancer Mother   . Diabetes Father   . Heart disease Father     Review of Systems As per history of present illness. Having some knee pain but this is improving. No chest pain or breathing difficulties reported.  Objective:   Physical Exam  @BP  118/60 (BP Location: Right Arm, Patient Position: Sitting, Cuff Size: Large)   Pulse 76   Ht 5' 4.5" (1.638 m) Comment: height measured without shoes  Wt 245 lb (111.1 kg)   BMI 41.40 kg/m @  General:  NAD Eyes:   anicteric Lungs:  clear Heart::  S1S2 no rubs, murmurs or  gallops Abdomen:  soft and nontender, BS+ Ext:   no edema, cyanosis or clubbing    Data Reviewed:   As per history of present illness Anesthesia notes from 2015 and 2017

## 2016-08-23 NOTE — Patient Instructions (Addendum)
Your nurse visit is on 11/02/16 at 10:00am in this building on the 3rd floor.   You will be contaced by our office prior to your procedure for directions on holding your Xarelto.  If you do not hear from our office 1 week prior to your scheduled procedure, please call [phone] to discuss.

## 2016-08-23 NOTE — Telephone Encounter (Signed)
   Cynthia Herrera 03/27/1946 [id number]  Dear Dr.Traci Radford Pax:  We have scheduled the above named patient for a(n) Colonoscopy procedure. Our records show that (s)he is on anticoagulation therapy.  Please advise as to whether the patient may come of their therapy of Xarelto 48 hours prior to their procedure which is scheduled for 11/16/16.  Please route your response to  Marlon Pel, CMA.  Sincerely,    [facility]

## 2016-08-23 NOTE — Telephone Encounter (Signed)
Notified patient per Dr. Radford Pax she can hold Xarelto 48 hours prior to her procedure. Patient verbalized understanding.

## 2016-08-27 ENCOUNTER — Encounter: Payer: Self-pay | Admitting: Pharmacist

## 2016-08-27 ENCOUNTER — Ambulatory Visit (INDEPENDENT_AMBULATORY_CARE_PROVIDER_SITE_OTHER): Payer: Medicare Other | Admitting: Pharmacist

## 2016-08-27 VITALS — BP 136/62 | HR 65

## 2016-08-27 DIAGNOSIS — Z23 Encounter for immunization: Secondary | ICD-10-CM | POA: Diagnosis not present

## 2016-08-27 DIAGNOSIS — I10 Essential (primary) hypertension: Secondary | ICD-10-CM

## 2016-08-27 NOTE — Progress Notes (Signed)
Patient ID: GENETTA AUBUT                 DOB: March 27, 1946                      MRN: [id number]     HPI: Cynthia Herrera is a 70 y.o. female referred by Dr. Radford Pax to HTN clinic for PMH of CAD, HTN, AFib, HLD, CHF, DM. Toprol d/c'd on 10/18 due to fatigue and nebivolol started instead. Patient's BP was 102/64 at clinic visit on 10/18, then 118/60 on 10/30.  BP today is 136/62, HR 65. Patient states she is doing well on her current medication regimen and denies ADRs. She does report some muscle cramping in her hamstrings which she noticed happened at the same time as starting nebivolol. However, she says it's tolerable, sporadic, and hasn't occurred in 2 days. She does not have a BP cuff at home and does not monitor her BP other than in clinic.   Current HTN meds: losartan 25 mg daily, nebivolol 2.5 mg daily, spironolactone 25 mg daily, furosemide 40 mg  Previously tried: Diovan HCT 160-25 mg, losartan 50 mg, metoprolol 25 mg, irbesartan 150 mg, carvedilol 6.25 mg BID BP goal: <140/90 mmgHg  Family History: DM (father, mother, brothers x2, sister), CVD (father and brother).  Social History: Former smoker, quit on 08/24/2001. Denies alcohol and illicit drug use.     Wt Readings from Last 3 Encounters:  08/23/16 245 lb (111.1 kg)  08/11/16 242 lb 1.9 oz (109.8 kg)  05/10/16 249 lb (112.9 kg)   BP Readings from Last 3 Encounters:  08/23/16 118/60  08/11/16 102/64  05/12/16 (!) 140/57   Pulse Readings from Last 3 Encounters:  08/23/16 76  08/11/16 73  05/12/16 85    Renal function: CrCl cannot be calculated (Patient's most recent lab result is older than the maximum 21 days allowed.).  Past Medical History:  Diagnosis Date  . Allergy    seasonal  . Arthritis   . Breast cancer ([place])    left: surgery,chemo,radiation  . Cataracts, bilateral    immature  . Chronic back pain    lumbar stenosis  . Chronic systolic CHF (congestive heart failure) (HCC)    takes Furosemide and  Aldactone daily  . COPD (chronic obstructive pulmonary disease) (HCC)    no inhalers   . DCM (dilated cardiomyopathy) ([place])    EF 15-20% ? tachycardia induced - EF 50-55% on echo 2015  . Diabetes mellitus without complication ([place])    recent dx  . Gallstones   . GERD (gastroesophageal reflux disease)    once in a while;depends on what she eats  . History of bronchitis 15+yrs ago  . Hyperlipidemia   . Hypertension    takes Losartan and Metoprolol.   . Joint pain   . Muscle spasm    takes Flexeril daily as needed   . Nocturia   . Osteopenia   . Persistent atrial fibrillation ([place])    s/p TEE DCCV and repeat DCCV 11/14/2013  . Personal history of colonic polyps    adenomas 03 and 08  . Vitamin D deficiency    takes Vit D    Current Outpatient Prescriptions on File Prior to Visit  Medication Sig Dispense Refill  . atorvastatin (LIPITOR) 40 MG tablet TAKE 1 TABLET (40 MG TOTAL) BY MOUTH AT BEDTIME. 90 tablet 3  . fluticasone (FLONASE) 50 MCG/ACT nasal spray Place 1 spray into both nostrils daily  as needed for allergies.    . furosemide (LASIX) 20 MG tablet TAKE 2 TABLETS (40 MG TOTAL) BY MOUTH DAILY. 180 tablet 3  . glucose blood (ONETOUCH VERIO) test strip 1 each by Other route daily as needed for other. 100 each 12  . KLOR-CON M20 20 MEQ tablet TAKE 1 TABLET (20 MEQ TOTAL) BY MOUTH DAILY. 180 tablet 3  . losartan (COZAAR) 25 MG tablet Take 1 tablet (25 mg total) by mouth 2 (two) times daily. 180 tablet 2  . metFORMIN (GLUCOPHAGE) 500 MG tablet TAKE 1 TABLET TWICE A DAY WITH A MEAL 180 tablet 1  . methocarbamol (ROBAXIN) 500 MG tablet Take 1 tablet (500 mg total) by mouth every 6 (six) hours as needed for muscle spasms. 90 tablet 0  . nebivolol (BYSTOLIC) 2.5 MG tablet Take 1 tablet (2.5 mg total) by mouth daily. 30 tablet 11  . spironolactone (ALDACTONE) 25 MG tablet TAKE 1 TABLET (25 MG TOTAL) BY MOUTH DAILY. 90 tablet 3  . XARELTO 20 MG TABS tablet TAKE 1 TABLET (20 MG TOTAL) BY  MOUTH DAILY WITH SUPPER. 30 tablet 6   No current facility-administered medications on file prior to visit.     Allergies  Allergen Reactions  . Codeine Phosphate Nausea And Vomiting  . Ambien [Zolpidem] Other (See Comments)    ? Hallucinations, up walking around  . Medrol [Methylprednisolone] Other (See Comments)    Felt really weird w the high dose oral steroid, but tolerates low doses or oral steroid     Assessment/Plan:  1. HTN: Patient's BP is at goal <140/90 mmHg and tolerating medications well. No medication changes to be made to her medication regimen today. Advised patient to call clinic if leg cramps become intolerable. Follow-up in clinic with Dr. Radford Pax at 40-month visit.   Cortney Beissel E. Oddie Bottger, PharmD, [facility] [id number] [place], [place] [address] Phone: [phone]; Fax: [phone] 08/27/2016 3:20 PM

## 2016-10-25 LAB — HM COLONOSCOPY

## 2016-10-28 DIAGNOSIS — Z471 Aftercare following joint replacement surgery: Secondary | ICD-10-CM | POA: Diagnosis not present

## 2016-10-28 DIAGNOSIS — Z96651 Presence of right artificial knee joint: Secondary | ICD-10-CM | POA: Diagnosis not present

## 2016-11-02 ENCOUNTER — Ambulatory Visit (AMBULATORY_SURGERY_CENTER): Payer: Self-pay

## 2016-11-02 VITALS — Ht 66.0 in | Wt 249.0 lb

## 2016-11-02 DIAGNOSIS — Z86010 Personal history of colonic polyps: Secondary | ICD-10-CM

## 2016-11-02 NOTE — Progress Notes (Signed)
Per pt, no allergies to soy or egg products.Pt not taking any weight loss meds or using  O2 at home. 

## 2016-11-15 ENCOUNTER — Encounter (HOSPITAL_COMMUNITY): Payer: Self-pay

## 2016-11-16 ENCOUNTER — Ambulatory Visit (HOSPITAL_COMMUNITY): Payer: Medicare Other | Admitting: Certified Registered Nurse Anesthetist

## 2016-11-16 ENCOUNTER — Encounter (HOSPITAL_COMMUNITY): Payer: Self-pay

## 2016-11-16 ENCOUNTER — Encounter (HOSPITAL_COMMUNITY)
Admission: RE | Disposition: A | Discharge status: Home / Self Care | Payer: Self-pay | Source: Ambulatory Visit | Attending: Internal Medicine

## 2016-11-16 ENCOUNTER — Ambulatory Visit (HOSPITAL_COMMUNITY)
Admission: RE | Admit: 2016-11-16 | Discharge: 2016-11-16 | Disposition: A | Discharge status: Home / Self Care | Payer: Medicare Other | Source: Ambulatory Visit | Attending: Internal Medicine | Admitting: Internal Medicine

## 2016-11-16 DIAGNOSIS — D128 Benign neoplasm of rectum: Secondary | ICD-10-CM

## 2016-11-16 DIAGNOSIS — Z1211 Encounter for screening for malignant neoplasm of colon: Secondary | ICD-10-CM | POA: Diagnosis not present

## 2016-11-16 DIAGNOSIS — Z853 Personal history of malignant neoplasm of breast: Secondary | ICD-10-CM | POA: Insufficient documentation

## 2016-11-16 DIAGNOSIS — M8580 Other specified disorders of bone density and structure, unspecified site: Secondary | ICD-10-CM | POA: Insufficient documentation

## 2016-11-16 DIAGNOSIS — Z87891 Personal history of nicotine dependence: Secondary | ICD-10-CM | POA: Diagnosis not present

## 2016-11-16 DIAGNOSIS — M48061 Spinal stenosis, lumbar region without neurogenic claudication: Secondary | ICD-10-CM | POA: Insufficient documentation

## 2016-11-16 DIAGNOSIS — I2510 Atherosclerotic heart disease of native coronary artery without angina pectoris: Secondary | ICD-10-CM | POA: Diagnosis not present

## 2016-11-16 DIAGNOSIS — I110 Hypertensive heart disease with heart failure: Secondary | ICD-10-CM | POA: Diagnosis not present

## 2016-11-16 DIAGNOSIS — Z1212 Encounter for screening for malignant neoplasm of rectum: Secondary | ICD-10-CM | POA: Diagnosis not present

## 2016-11-16 DIAGNOSIS — I509 Heart failure, unspecified: Secondary | ICD-10-CM | POA: Diagnosis not present

## 2016-11-16 DIAGNOSIS — K219 Gastro-esophageal reflux disease without esophagitis: Secondary | ICD-10-CM | POA: Diagnosis not present

## 2016-11-16 DIAGNOSIS — G4730 Sleep apnea, unspecified: Secondary | ICD-10-CM | POA: Diagnosis not present

## 2016-11-16 DIAGNOSIS — Z86010 Personal history of colonic polyps: Secondary | ICD-10-CM | POA: Diagnosis not present

## 2016-11-16 DIAGNOSIS — I481 Persistent atrial fibrillation: Secondary | ICD-10-CM | POA: Insufficient documentation

## 2016-11-16 DIAGNOSIS — E785 Hyperlipidemia, unspecified: Secondary | ICD-10-CM | POA: Diagnosis not present

## 2016-11-16 DIAGNOSIS — I420 Dilated cardiomyopathy: Secondary | ICD-10-CM | POA: Diagnosis not present

## 2016-11-16 DIAGNOSIS — M1990 Unspecified osteoarthritis, unspecified site: Secondary | ICD-10-CM | POA: Diagnosis not present

## 2016-11-16 DIAGNOSIS — K621 Rectal polyp: Secondary | ICD-10-CM | POA: Insufficient documentation

## 2016-11-16 DIAGNOSIS — E559 Vitamin D deficiency, unspecified: Secondary | ICD-10-CM | POA: Diagnosis not present

## 2016-11-16 DIAGNOSIS — G8929 Other chronic pain: Secondary | ICD-10-CM | POA: Diagnosis not present

## 2016-11-16 DIAGNOSIS — Z7901 Long term (current) use of anticoagulants: Secondary | ICD-10-CM | POA: Insufficient documentation

## 2016-11-16 DIAGNOSIS — Z7984 Long term (current) use of oral hypoglycemic drugs: Secondary | ICD-10-CM | POA: Diagnosis not present

## 2016-11-16 DIAGNOSIS — K5730 Diverticulosis of large intestine without perforation or abscess without bleeding: Secondary | ICD-10-CM | POA: Diagnosis not present

## 2016-11-16 DIAGNOSIS — I4819 Other persistent atrial fibrillation: Secondary | ICD-10-CM

## 2016-11-16 DIAGNOSIS — J449 Chronic obstructive pulmonary disease, unspecified: Secondary | ICD-10-CM | POA: Insufficient documentation

## 2016-11-16 DIAGNOSIS — K648 Other hemorrhoids: Secondary | ICD-10-CM | POA: Insufficient documentation

## 2016-11-16 DIAGNOSIS — Z79899 Other long term (current) drug therapy: Secondary | ICD-10-CM | POA: Diagnosis not present

## 2016-11-16 DIAGNOSIS — T884XXD Failed or difficult intubation, subsequent encounter: Secondary | ICD-10-CM

## 2016-11-16 DIAGNOSIS — E119 Type 2 diabetes mellitus without complications: Secondary | ICD-10-CM | POA: Insufficient documentation

## 2016-11-16 HISTORY — PX: COLONOSCOPY WITH PROPOFOL: SHX5780

## 2016-11-16 LAB — GLUCOSE, CAPILLARY: GLUCOSE-CAPILLARY: 124 mg/dL — AB (ref 65–99)

## 2016-11-16 SURGERY — COLONOSCOPY WITH PROPOFOL
Anesthesia: Monitor Anesthesia Care

## 2016-11-16 MED ORDER — PROPOFOL 10 MG/ML IV BOLUS
INTRAVENOUS | Status: AC
  Filled 2016-11-16: qty 40

## 2016-11-16 MED ORDER — LIDOCAINE 2% (20 MG/ML) 5 ML SYRINGE
INTRAMUSCULAR | Status: AC
  Filled 2016-11-16: qty 5

## 2016-11-16 MED ORDER — SODIUM CHLORIDE 0.9 % IV SOLN: INTRAVENOUS | Status: DC

## 2016-11-16 MED ORDER — ONDANSETRON HCL 4 MG/2ML IJ SOLN
INTRAMUSCULAR | Status: AC
  Filled 2016-11-16: qty 2

## 2016-11-16 MED ORDER — PHENYLEPHRINE HCL 10 MG/ML IJ SOLN
INTRAMUSCULAR | Status: DC | PRN
  Administered 2016-11-16: 40 ug via INTRAVENOUS

## 2016-11-16 MED ORDER — PHENYLEPHRINE 40 MCG/ML (10ML) SYRINGE FOR IV PUSH (FOR BLOOD PRESSURE SUPPORT)
PREFILLED_SYRINGE | INTRAVENOUS | Status: AC
  Filled 2016-11-16: qty 10

## 2016-11-16 MED ORDER — LACTATED RINGERS IV SOLN
INTRAVENOUS | Status: DC
  Administered 2016-11-16: 1000 mL via INTRAVENOUS

## 2016-11-16 MED ORDER — PROPOFOL 10 MG/ML IV BOLUS
INTRAVENOUS | Status: AC
  Filled 2016-11-16: qty 20

## 2016-11-16 MED ORDER — PROPOFOL 500 MG/50ML IV EMUL
INTRAVENOUS | Status: DC | PRN
  Administered 2016-11-16: 200 ug/kg/min via INTRAVENOUS

## 2016-11-16 MED ORDER — ONDANSETRON HCL 4 MG/2ML IJ SOLN
INTRAMUSCULAR | Status: DC | PRN
  Administered 2016-11-16: 4 mg via INTRAVENOUS

## 2016-11-16 MED ORDER — PROPOFOL 10 MG/ML IV BOLUS
INTRAVENOUS | Status: DC | PRN
  Administered 2016-11-16 (×2): 20 mg via INTRAVENOUS

## 2016-11-16 SURGICAL SUPPLY — 22 items

## 2016-11-16 NOTE — Transfer of Care (Signed)
Immediate Anesthesia Transfer of Care Note  Patient: Cynthia Herrera  Procedure(s) Performed: Procedure(s): COLONOSCOPY WITH PROPOFOL (N/A)  Patient Location: PACU  Anesthesia Type:MAC  Level of Consciousness:  sedated, patient cooperative and responds to stimulation  Airway & Oxygen Therapy:Patient Spontanous Breathing and Patient connected to face mask oxgen  Post-op Assessment:  Report given to PACU RN and Post -op Vital signs reviewed and stable  Post vital signs:  Reviewed and stable  Last Vitals:  Vitals:   11/16/16 0732 11/16/16 0746  BP:  (!) 148/62  Pulse:  70  Resp:  20  Temp: 58.8 C     Complications: No apparent anesthesia complications

## 2016-11-16 NOTE — Op Note (Signed)
[facility] Patient Name: Cynthia Herrera Procedure Date: 11/16/2016 MRN: [id number] Attending MD: Gatha Mayer , MD Date of Birth: 1946/08/03 CSN: [id number] Age: 71 Admit Type: Outpatient Procedure:                Colonoscopy Indications:              Surveillance: Personal history of colonic polyps                            (unknown histology) on last colonoscopy 5 years ago Providers:                Gatha Mayer, MD, Hilma Favors, RN, Elspeth Cho Tech., Technician, Virgia Land, CRNA Referring MD:              Medicines:                Propofol per Anesthesia, Monitored Anesthesia Care Complications:            No immediate complications. Estimated Blood Loss:     Estimated blood loss was minimal. Procedure:                Pre-Anesthesia Assessment:                           - Prior to the procedure, a History and Physical                            was performed, and patient medications and                            allergies were reviewed. The patient's tolerance of                            previous anesthesia was also reviewed. The risks                            and benefits of the procedure and the sedation                            options and risks were discussed with the patient.                            All questions were answered, and informed consent                            was obtained. Prior Anticoagulants: The patient                            last took Xarelto (rivaroxaban) 2 days prior to the                            procedure. ASA Grade Assessment: III - A patient  with severe systemic disease. After reviewing the                            risks and benefits, the patient was deemed in                            satisfactory condition to undergo the procedure.                           After obtaining informed consent, the colonoscope                            was passed  under direct vision. Throughout the                            procedure, the patient's blood pressure, pulse, and                            oxygen saturations were monitored continuously. The                            [id number]) scope was introduced through                            the anus and advanced to the the cecum, identified                            by appendiceal orifice and ileocecal valve. The                            colonoscopy was performed without difficulty. The                            patient tolerated the procedure well. The quality                            of the bowel preparation was good. The bowel                            preparation used was Miralax. The ileocecal valve,                            appendiceal orifice, and rectum were photographed. Scope In: 8:41:10 AM Scope Out: 9:00:06 AM Scope Withdrawal Time: 0 hours 15 minutes 36 seconds  Total Procedure Duration: 0 hours 18 minutes 56 seconds  Findings:      The perianal and digital rectal examinations were normal.      A diminutive polyp was found in the rectum. The polyp was sessile. The       polyp was removed with a cold snare. Resection and retrieval were       complete. Verification of patient identification for the specimen was       done. Estimated blood loss was minimal.      A few small-mouthed diverticula were found in the sigmoid colon.  Internal hemorrhoids were found during retroflexion.      The exam was otherwise without abnormality on direct and retroflexion       views. Impression:               - One diminutive polyp in the rectum, removed with                            a cold snare. Resected and retrieved.                           - Diverticulosis in the sigmoid colon.                           - Internal hemorrhoids.                           - The examination was otherwise normal on direct                            and retroflexion views. Moderate  Sedation:      Please see anesthesia notes, moderate sedation not given Recommendation:           - Patient has a contact number available for                            emergencies. The signs and symptoms of potential                            delayed complications were discussed with the                            patient. Return to normal activities tomorrow.                            Written discharge instructions were provided to the                            patient.                           - Resume previous diet.                           - Continue present medications.                           - Resume Xarelto (rivaroxaban) at prior dose today.                           - Repeat colonoscopy is recommended. The                            colonoscopy date will be determined after pathology                            results from today's exam become available  for                            review. Procedure Code(s):        --- Professional ---                           [phone], Colonoscopy, flexible; with removal of                            tumor(s), polyp(s), or other lesion(s) by snare                            technique Diagnosis Code(s):        --- Professional ---                           Z86.010, Personal history of colonic polyps                           K62.1, Rectal polyp                           K64.8, Other hemorrhoids                           K57.30, Diverticulosis of large intestine without                            perforation or abscess without bleeding CPT copyright 2016 American Medical Association. All rights reserved. The codes documented in this report are preliminary and upon coder review may  be revised to meet current compliance requirements. Gatha Mayer, MD 11/16/2016 9:14:49 AM This report has been signed electronically. Number of Addenda: 0

## 2016-11-16 NOTE — Discharge Instructions (Signed)
° °  I found and removed one tiny polyp. I will let you know pathology results and if/when to have another routine colonoscopy by mail.  Also saw diverticulosis and hemorrhoids (internal).  I appreciate the opportunity to care for you. Gatha Mayer, MD, FACG  YOU HAD AN ENDOSCOPIC PROCEDURE TODAY: Refer to the procedure report and other information in the discharge instructions given to you for any specific questions about what was found during the examination. If this information does not answer your questions, please call Dr. Celesta Aver office at [phone] to clarify.   YOU SHOULD EXPECT: Some feelings of bloating in the abdomen. Passage of more gas than usual. Walking can help get rid of the air that was put into your GI tract during the procedure and reduce the bloating. If you had a lower endoscopy (such as a colonoscopy or flexible sigmoidoscopy) you may notice spotting of blood in your stool or on the toilet paper. Some abdominal soreness may be present for a day or two, also.  DIET: Your first meal following the procedure should be a light meal and then it is ok to progress to your normal diet. A half-sandwich or bowl of soup is an example of a good first meal. Heavy or fried foods are harder to digest and may make you feel nauseous or bloated. Drink plenty of fluids but you should avoid alcoholic beverages for 24 hours.   ACTIVITY: Your care partner should take you home directly after the procedure. You should plan to take it easy, moving slowly for the rest of the day. You can resume normal activity the day after the procedure however YOU SHOULD NOT DRIVE, use power tools, machinery or perform tasks that involve climbing or major physical exertion for 24 hours (because of the sedation medicines used during the test).   SYMPTOMS TO REPORT IMMEDIATELY: A gastroenterologist can be reached at any hour. Please call [phone]  for any of the following symptoms:  Following lower  endoscopy (colonoscopy, flexible sigmoidoscopy) Excessive amounts of blood in the stool  Significant tenderness, worsening of abdominal pains  Swelling of the abdomen that is new, acute  Fever of 100 or higher  Following upper endoscopy (EGD, EUS, ERCP, esophageal dilation) Vomiting of blood or coffee ground material  New, significant abdominal pain  New, significant chest pain or pain under the shoulder blades  Painful or persistently difficult swallowing  New shortness of breath  Black, tarry-looking or red, bloody stools  FOLLOW UP:  If any biopsies were taken you will be contacted by phone or by letter within the next 1-3 weeks. Call [phone]  if you have not heard about the biopsies in 3 weeks.  Please also call with any specific questions about appointments or follow up tests.

## 2016-11-16 NOTE — Anesthesia Preprocedure Evaluation (Signed)
Anesthesia Evaluation  Patient identified by MRN, date of birth, ID band Patient awake    Reviewed: Allergy & Precautions, NPO status , Patient's Chart, lab work & pertinent test results, reviewed documented beta blocker date and time   History of Anesthesia Complications Negative for: history of anesthetic complications  Airway Mallampati: III  TM Distance: <3 FB Neck ROM: Full    Dental  (+) Teeth Intact, Partial Lower   Pulmonary neg shortness of breath, neg sleep apnea, COPD, neg recent URI, former smoker, neg PE   breath sounds clear to auscultation       Cardiovascular hypertension, Pt. on medications and Pt. on home beta blockers (-) angina+ CAD, + Peripheral Vascular Disease, +CHF and + DOE  (-) Past MI + dysrhythmias Atrial Fibrillation  Rhythm:Regular     Neuro/Psych negative neurological ROS  negative psych ROS   GI/Hepatic GERD  Controlled,  Endo/Other  diabetes, Type 2, Oral Hypoglycemic AgentsMorbid obesity  Renal/GU      Musculoskeletal  (+) Arthritis ,   Abdominal   Peds  Hematology   Anesthesia Other Findings   Reproductive/Obstetrics                             Anesthesia Physical Anesthesia Plan  ASA: III  Anesthesia Plan: MAC   Post-op Pain Management:    Induction: Intravenous  Airway Management Planned: Natural Airway, Nasal Cannula and Simple Face Mask  Additional Equipment: None  Intra-op Plan:   Post-operative Plan:   Informed Consent: I have reviewed the patients History and Physical, chart, labs and discussed the procedure including the risks, benefits and alternatives for the proposed anesthesia with the patient or authorized representative who has indicated his/her understanding and acceptance.   Dental advisory given  Plan Discussed with: Surgeon and CRNA  Anesthesia Plan Comments:         Anesthesia Quick Evaluation

## 2016-11-16 NOTE — Anesthesia Postprocedure Evaluation (Addendum)
Anesthesia Post Note  Patient: Cynthia Herrera  Procedure(s) Performed: Procedure(s) (LRB): COLONOSCOPY WITH PROPOFOL (N/A)  Patient location during evaluation: PACU Anesthesia Type: MAC Level of consciousness: awake and alert Pain management: pain level controlled Vital Signs Assessment: post-procedure vital signs reviewed and stable Respiratory status: spontaneous breathing, nonlabored ventilation, respiratory function stable and patient connected to nasal cannula oxygen Cardiovascular status: stable and blood pressure returned to baseline Anesthetic complications: no       Last Vitals:  Vitals:   11/16/16 0746 11/16/16 0907  BP: (!) 148/62 (!) 107/58  Pulse: 70   Resp: 20 13  Temp:      Last Pain:  Vitals:   11/16/16 0732  TempSrc: Oral                 Valaree Fresquez

## 2016-11-16 NOTE — H&P (Signed)
[facility] History and Physical   Primary Care Physician:  Nyoka Cowden, MD   Reason for Procedure:   hx colon polyps  Plan:    colonoscopy     HPI: Cynthia Herrera is a 71 y.o. female with prior adenomatous polyps here for surveillance colonoscopy. Xarelto has been held.   Past Medical History:  Diagnosis Date  . Allergy    seasonal  . Arthritis   . Breast cancer ([place]) 2002   left: surgery,chemo,radiation  . Cataracts, bilateral    immature  . Chronic back pain    lumbar stenosis  . Chronic systolic CHF (congestive heart failure) (H[facility])    takes Furosemide and Aldactone daily  . COPD (chronic obstructive pulmonary disease) ([facility])    no inhalers   . DCM (dilated cardiomyopathy) ([place])    EF 15-20% ? tachycardia induced - EF 50-55% on echo 2015  . Diabetes mellitus without complication ([place])    recent dx  . Gallstones   . GERD (gastroesophageal reflux disease)    once in a while;depends on what she eats  . History of bronchitis 15+yrs ago  . Hyperlipidemia   . Hypertension    takes Losartan and Metoprolol.   . Joint pain   . Muscle spasm    takes Flexeril daily as needed   . Nocturia   . Osteopenia   . Persistent atrial fibrillation ([place])    s/p TEE DCCV and repeat DCCV 11/14/2013  . Personal history of colonic polyps    adenomas 03 and 08  . Vitamin D deficiency    takes Vit D    Past Surgical History:  Procedure Laterality Date  . BACK SURGERY  01/2014  . BREAST LUMPECTOMY  2002   left breast  . CARDIAC CATHETERIZATION  2015  . CARDIOVERSION N/A 11/12/2013   Procedure: CARDIOVERSION;  Surgeon: Thayer Headings, MD;  Location: [facility] ENDOSCOPY;  Service: Cardiovascular;  Laterality: N/A;  10:08  Dr. Marissa Nestle, anesthesia present, [place]   60mg ,  propofol 50mg , IV for elective cardioversion....Dr. Cathie Olden delievered synch 120 joules with successful cardioversion to NSR  . CARDIOVERSION N/A 11/12/2013   Procedure: CARDIOVERSION;  Surgeon:  Thayer Headings, MD;  Location: [facility];  Service: Cardiovascular;  Laterality: N/A;  . CARDIOVERSION N/A 11/14/2013   Procedure: CARDIOVERSION (BEDSIDE);  Surgeon: Larey Dresser, MD;  Location: [facility];  Service: Cardiovascular;  Laterality: N/A;  . CHOLECYSTECTOMY  2004  . COLONOSCOPY  2003, September 2008, April 01, 2011   adenomas 03 and 08, polyp 12, diverticulosis  . JOINT REPLACEMENT     totally replacement surgery  . LEFT AND RIGHT HEART CATHETERIZATION WITH CORONARY ANGIOGRAM N/A 02/01/2014   Procedure: LEFT AND RIGHT HEART CATHETERIZATION WITH CORONARY ANGIOGRAM;  Surgeon: Sueanne Margarita, MD;  Location: [facility] CATH LAB;  Service: Cardiovascular;  Laterality: N/A;  . LUMBAR LAMINECTOMY/DECOMPRESSION MICRODISCECTOMY N/A 03/19/2014   Procedure: LUMBAR LAMINECTOMY/DECOMPRESSION MICRODISCECTOMY 4 LEVEL;  Surgeon: Kristeen Miss, MD;  Location: [facility] NEURO ORS;  Service: Neurosurgery;  Laterality: N/A;  L1-2 L2-3 L3-4 L4-5 Laminectomy/Foraminotomy  . MASTECTOMY  July 2002   left  . port a cath placed  2002  . PORT-A-CATH REMOVAL  2003  . TEE WITHOUT CARDIOVERSION N/A 11/12/2013   Procedure: TRANSESOPHAGEAL ECHOCARDIOGRAM (TEE);  Surgeon: Thayer Headings, MD;  Location: [facility];  Service: Cardiovascular;  Laterality: N/A;  pt b/p low, pt buccal membranes very dry, lips scapped, pt c/o thirst. NPO since MN and iv FLUIDS TOTAL INFUSING AT TOTAL  20ML HR....  Dr. Cathie Olden order allow NS to bolus during procedure....very dry,NS bolus 250 ml total..pt responding well to meds..  . TOTAL KNEE ARTHROPLASTY  2006  . TOTAL KNEE ARTHROPLASTY Right 05/10/2016   Procedure: RIGHT TOTAL KNEE ARTHROPLASTY;  Surgeon: Gaynelle Arabian, MD;  Location: [facility] ORS;  Service: Orthopedics;  Laterality: Right;  With adductor block  . TUBAL LIGATION      Prior to Admission medications   Medication Sig Start Date End Date Taking? Authorizing Provider  atorvastatin (LIPITOR) 40 MG tablet TAKE 1 TABLET (40 MG TOTAL) BY MOUTH AT  BEDTIME. 03/25/16  Yes Sueanne Margarita, MD  Cholecalciferol (VITAMIN D3) 2000 units capsule Take 2,000 Units by mouth daily.   Yes Historical Provider, MD  fluticasone (FLONASE) 50 MCG/ACT nasal spray Place 1 spray into both nostrils daily as needed for allergies.   Yes Historical Provider, MD  furosemide (LASIX) 20 MG tablet TAKE 2 TABLETS (40 MG TOTAL) BY MOUTH DAILY. 12/16/15  Yes Sueanne Margarita, MD  KLOR-CON M20 20 MEQ tablet TAKE 1 TABLET (20 MEQ TOTAL) BY MOUTH DAILY. 01/27/16  Yes Sueanne Margarita, MD  losartan (COZAAR) 25 MG tablet Take 1 tablet (25 mg total) by mouth 2 (two) times daily. 05/21/16  Yes Sueanne Margarita, MD  metFORMIN (GLUCOPHAGE) 500 MG tablet TAKE 1 TABLET TWICE A DAY WITH A MEAL 08/02/16  Yes Marletta Lor, MD  nebivolol (BYSTOLIC) 2.5 MG tablet Take 1 tablet (2.5 mg total) by mouth daily. 08/11/16  Yes Sueanne Margarita, MD  spironolactone (ALDACTONE) 25 MG tablet TAKE 1 TABLET (25 MG TOTAL) BY MOUTH DAILY. 03/25/16  Yes Traci R Turner, MD  XARELTO 20 MG TABS tablet TAKE 1 TABLET (20 MG TOTAL) BY MOUTH DAILY WITH SUPPER. 07/12/16  Yes Sueanne Margarita, MD    Current Facility-Administered Medications  Medication Dose Route Frequency Provider Last Rate Last Dose  . 0.9 %  sodium chloride infusion   Intravenous Continuous Gatha Mayer, MD      . lactated ringers infusion   Intravenous Continuous Gatha Mayer, MD 125 mL/hr at 11/16/16 0753 1,000 mL at 11/16/16 0753    Allergies as of 08/23/2016 - Review Complete 08/23/2016  Allergen Reaction Noted  . Codeine phosphate Nausea And Vomiting   . Ambien [zolpidem] Other (See Comments) 05/10/2016  . Medrol [methylprednisolone] Other (See Comments) 05/10/2016    Family History  Problem Relation Age of Onset  . Dementia Sister   . Diabetes Sister   . Alzheimer's disease Sister   . Diabetes Brother     x 2  . Heart disease Brother   . Irritable bowel syndrome Brother   . Liver cancer Mother   . Diabetes Mother   . Pancreatic  cancer Mother   . Diabetes Father   . Heart disease Father     Social History   Social History  . Marital status: Widowed    Spouse name: N/A  . Number of children: 2  . Years of education: N/A   Occupational History  . retired    Social History Main Topics  . Smoking status: Former Smoker    Quit date: 08/24/2001  . Smokeless tobacco: Never Used  . Alcohol use No  . Drug use: No  . Sexual activity: Not on file   Other Topics Concern  . Not on file   Social History Narrative  . No narrative on file    Review of Systems: All other review of  systems negative except as mentioned in the HPI.  Physical Exam: Vital signs in last 24 hours: Temp:  [97.8 F (36.6 C)] 97.8 F (36.6 C) (01/23 0732) Pulse Rate:  [70] 70 (01/23 0746) Resp:  [20] 20 (01/23 0746) BP: (148)/(62) 148/62 (01/23 0746) SpO2:  [100 %] 100 % (01/23 0746) Weight:  [249 lb (112.9 kg)] 249 lb (112.9 kg) (01/23 0746)   General:   Alert,  Well-developed, well-nourished, pleasant and cooperative in NAD Lungs:  Clear throughout to auscultation.   Heart:  [id number]; no murmurs, clicks, rubs,  or gallops. Abdomen:  Soft, nontender and nondistended. Normal bowel sounds.   Neuro/Psych:  Alert and cooperative. Normal mood and affect. A and O x 3   @Carl  Simonne Maffucci, MD, [facility] [phone] (pager) 11/16/2016 8:30 AM@

## 2016-11-17 ENCOUNTER — Encounter (HOSPITAL_COMMUNITY): Payer: Self-pay | Admitting: Internal Medicine

## 2016-11-18 ENCOUNTER — Encounter: Payer: Self-pay | Admitting: Internal Medicine

## 2016-11-18 DIAGNOSIS — Z86010 Personal history of colonic polyps: Secondary | ICD-10-CM

## 2016-11-18 NOTE — Progress Notes (Signed)
Hyperplastic polyp Does have hx adenomas (small) since 2003 so will consider repeat at 43 - 4 yrs 2023

## 2016-11-30 DIAGNOSIS — Z853 Personal history of malignant neoplasm of breast: Secondary | ICD-10-CM | POA: Diagnosis not present

## 2016-11-30 DIAGNOSIS — Z01419 Encounter for gynecological examination (general) (routine) without abnormal findings: Secondary | ICD-10-CM | POA: Diagnosis not present

## 2016-11-30 DIAGNOSIS — Z780 Asymptomatic menopausal state: Secondary | ICD-10-CM | POA: Diagnosis not present

## 2016-12-02 ENCOUNTER — Encounter: Payer: Self-pay | Admitting: Internal Medicine

## 2016-12-10 ENCOUNTER — Other Ambulatory Visit: Payer: Medicare Other

## 2016-12-15 ENCOUNTER — Encounter: Payer: Medicare Other | Admitting: Internal Medicine

## 2016-12-15 ENCOUNTER — Other Ambulatory Visit: Payer: Self-pay | Admitting: Cardiology

## 2017-01-11 ENCOUNTER — Other Ambulatory Visit: Payer: Self-pay | Admitting: Internal Medicine

## 2017-01-11 DIAGNOSIS — Z0000 Encounter for general adult medical examination without abnormal findings: Secondary | ICD-10-CM

## 2017-01-12 ENCOUNTER — Other Ambulatory Visit: Payer: Self-pay | Admitting: Family Medicine

## 2017-01-12 DIAGNOSIS — I10 Essential (primary) hypertension: Secondary | ICD-10-CM

## 2017-01-12 DIAGNOSIS — E785 Hyperlipidemia, unspecified: Secondary | ICD-10-CM

## 2017-01-12 DIAGNOSIS — D649 Anemia, unspecified: Secondary | ICD-10-CM

## 2017-01-12 DIAGNOSIS — I70209 Unspecified atherosclerosis of native arteries of extremities, unspecified extremity: Secondary | ICD-10-CM

## 2017-01-12 DIAGNOSIS — E1151 Type 2 diabetes mellitus with diabetic peripheral angiopathy without gangrene: Secondary | ICD-10-CM

## 2017-01-14 ENCOUNTER — Other Ambulatory Visit: Payer: Medicare Other

## 2017-01-21 ENCOUNTER — Encounter: Payer: Medicare Other | Admitting: Internal Medicine

## 2017-01-27 ENCOUNTER — Encounter: Payer: Self-pay | Admitting: Cardiology

## 2017-02-01 ENCOUNTER — Other Ambulatory Visit: Payer: Self-pay | Admitting: Internal Medicine

## 2017-02-03 ENCOUNTER — Encounter: Payer: Self-pay | Admitting: Internal Medicine

## 2017-02-03 ENCOUNTER — Ambulatory Visit (INDEPENDENT_AMBULATORY_CARE_PROVIDER_SITE_OTHER): Payer: Medicare Other | Admitting: Internal Medicine

## 2017-02-03 ENCOUNTER — Other Ambulatory Visit: Payer: Self-pay | Admitting: Internal Medicine

## 2017-02-03 VITALS — BP 128/68 | HR 80 | Temp 97.7°F | Ht 65.5 in | Wt 254.2 lb

## 2017-02-03 DIAGNOSIS — I10 Essential (primary) hypertension: Secondary | ICD-10-CM

## 2017-02-03 DIAGNOSIS — E785 Hyperlipidemia, unspecified: Secondary | ICD-10-CM

## 2017-02-03 DIAGNOSIS — Z86010 Personal history of colonic polyps: Secondary | ICD-10-CM | POA: Diagnosis not present

## 2017-02-03 DIAGNOSIS — Z0000 Encounter for general adult medical examination without abnormal findings: Secondary | ICD-10-CM

## 2017-02-03 DIAGNOSIS — I5042 Chronic combined systolic (congestive) and diastolic (congestive) heart failure: Secondary | ICD-10-CM | POA: Diagnosis not present

## 2017-02-03 DIAGNOSIS — E1151 Type 2 diabetes mellitus with diabetic peripheral angiopathy without gangrene: Secondary | ICD-10-CM | POA: Diagnosis not present

## 2017-02-03 DIAGNOSIS — I70209 Unspecified atherosclerosis of native arteries of extremities, unspecified extremity: Secondary | ICD-10-CM | POA: Diagnosis not present

## 2017-02-03 DIAGNOSIS — E119 Type 2 diabetes mellitus without complications: Secondary | ICD-10-CM

## 2017-02-03 LAB — COMPREHENSIVE METABOLIC PANEL
ALK PHOS: 109 U/L (ref 39–117)
ALT: 17 U/L (ref 0–35)
AST: 17 U/L (ref 0–37)
Albumin: 4.3 g/dL (ref 3.5–5.2)
BUN: 18 mg/dL (ref 6–23)
CHLORIDE: 106 meq/L (ref 96–112)
CO2: 25 mEq/L (ref 19–32)
Calcium: 9.5 mg/dL (ref 8.4–10.5)
Creatinine, Ser: 0.94 mg/dL (ref 0.40–1.20)
GFR: 62.33 mL/min (ref >60.00)
GLUCOSE: 134 mg/dL — AB (ref 70–99)
POTASSIUM: 3.8 meq/L (ref 3.5–5.1)
Sodium: 140 mEq/L (ref 135–145)
TOTAL PROTEIN: 6.8 g/dL (ref 6.0–8.3)
Total Bilirubin: 0.9 mg/dL (ref 0.2–1.2)

## 2017-02-03 LAB — CBC WITH DIFFERENTIAL/PLATELET
BASOS ABS: 0.1 10*3/uL (ref 0.0–0.1)
Basophils Relative: 0.7 % (ref 0.0–3.0)
Eosinophils Absolute: 0.3 10*3/uL (ref 0.0–0.7)
Eosinophils Relative: 2.8 % (ref 0.0–5.0)
HCT: 38 % (ref 36.0–46.0)
Hemoglobin: 13 g/dL (ref 12.0–15.0)
LYMPHS ABS: 1 10*3/uL (ref 0.7–4.0)
Lymphocytes Relative: 10.7 % — ABNORMAL LOW (ref 12.0–46.0)
MCHC: 34.2 g/dL (ref 30.0–36.0)
MCV: 88.2 fl (ref 78.0–100.0)
MONOS PCT: 8 % (ref 3.0–12.0)
Monocytes Absolute: 0.8 10*3/uL (ref 0.1–1.0)
NEUTROS PCT: 77.8 % — AB (ref 43.0–77.0)
Neutro Abs: 7.5 10*3/uL (ref 1.4–7.7)
Platelets: 315 10*3/uL (ref 150.0–400.0)
RBC: 4.31 Mil/uL (ref 3.87–5.11)
RDW: 14.2 % (ref 11.5–15.5)
WBC: 9.6 10*3/uL (ref 4.0–10.5)

## 2017-02-03 LAB — LIPID PANEL
Cholesterol: 119 mg/dL (ref 0–200)
HDL: 42.6 mg/dL (ref >39.00)
LDL CALC: 54 mg/dL (ref 0–99)
NONHDL: 76.61
Total CHOL/HDL Ratio: 3
Triglycerides: 115 mg/dL (ref 0.0–149.0)
VLDL: 23 mg/dL (ref 0.0–40.0)

## 2017-02-03 LAB — MICROALBUMIN / CREATININE URINE RATIO
CREATININE, U: 350.9 mg/dL
MICROALB/CREAT RATIO: 0.3 mg/g (ref 0.0–30.0)
Microalb, Ur: 1.2 mg/dL (ref 0.0–1.9)

## 2017-02-03 LAB — TSH: TSH: 0.86 u[IU]/mL (ref 0.35–4.50)

## 2017-02-03 LAB — HEMOGLOBIN A1C: Hgb A1c MFr Bld: 6.3 % (ref 4.6–6.5)

## 2017-02-03 NOTE — Patient Instructions (Signed)
Limit your sodium (Salt) intake  Please check your blood pressure on a regular basis.  If it is consistently greater than 150/90, please make an office appointment.    It is important that you exercise regularly, at least 20 minutes 3 to 4 times per week.  If you develop chest pain or shortness of breath seek  medical attention.  You need to lose weight.  Consider a lower calorie diet and regular exercise.  Return in 6 months for follow-up  Cardiology follow-up as scheduled  Annual eye examination

## 2017-02-03 NOTE — Progress Notes (Signed)
Subjective:    Patient ID: Cynthia Herrera, female    DOB: [birth year], 71 y.o.   MRN: [id number]  HPI  71 year old patient who is seen today for a preventivehealth examination and Medicare wellness visit. She is followed closely by cardiology.  She has a history of chronic combined systolic and diastolic heart failure as well as atrial fibrillation.  She remains on anticoagulation. She has a history of type 2 diabetes which has been well controlled with metformin therapy alone She is followed by cardiology.  Quarterly and does have at least an annual eye examination She is status post right total knee replacement surgery in July of last year She's had a recent colonoscopy.  Past Medical History:  Diagnosis Date  . Allergy    seasonal  . Arthritis   . Breast cancer ([place]) 2002   left: surgery,chemo,radiation  . Cataracts, bilateral    immature  . Chronic back pain    lumbar stenosis  . Chronic systolic CHF (congestive heart failure) (HCC)    takes Furosemide and Aldactone daily  . COPD (chronic obstructive pulmonary disease) (HCC)    no inhalers   . DCM (dilated cardiomyopathy) ([place])    EF 15-20% ? tachycardia induced - EF 50-55% on echo 2015  . Diabetes mellitus without complication ([place])    recent dx  . Gallstones   . GERD (gastroesophageal reflux disease)    once in a while;depends on what she eats  . History of bronchitis 15+yrs ago  . Hyperlipidemia   . Hypertension    takes Losartan and Metoprolol.   . Joint pain   . Muscle spasm    takes Flexeril daily as needed   . Nocturia   . Osteopenia   . Persistent atrial fibrillation ([place])    s/p TEE DCCV and repeat DCCV 11/14/2013  . Personal history of colonic polyps    adenomas 03 and 08  . Vitamin D deficiency    takes Vit D     Social History   Social History  . Marital status: Widowed    Spouse name: N/A  . Number of children: 2  . Years of education: N/A   Occupational History  . retired    Social  History Main Topics  . Smoking status: Former Smoker    Quit date: 08/24/2001  . Smokeless tobacco: Never Used  . Alcohol use No  . Drug use: No  . Sexual activity: Not on file   Other Topics Concern  . Not on file   Social History Narrative  . No narrative on file    Past Surgical History:  Procedure Laterality Date  . BACK SURGERY  01/2014  . BREAST LUMPECTOMY  2002   left breast  . CARDIAC CATHETERIZATION  2015  . CARDIOVERSION N/A 11/12/2013   Procedure: CARDIOVERSION;  Surgeon: [facility] Head[facility], MD;  Location: [facility] ENDOSCOPY;  Service: Cardiovascular;  Laterality: N/A;  10:08  Dr. Marissa Nestle, anesthesia present, Lido   60mg ,  propofol 50mg , IV for elective cardioversion....Dr. Cathie Olden delievered synch 120 joules with successful cardioversion to NSR  . CARDIOVERSION N/A 11/12/2013   Procedure: CARDIOVERSION;  Surgeon: [facility] Headings, MD;  Location: [facility];  Service: Cardiovascular;  Laterality: N/A;  . CARDIOVERSION N/A 11/14/2013   Procedure: CARDIOVERSION (BEDSIDE);  Surgeon: Larey Dresser, MD;  Location: [facility];  Service: Cardiovascular;  Laterality: N/A;  . CHOLECYSTECTOMY  2004  . COLONOSCOPY  2003, September 2008, April 01, 2011   adenomas 03 and 08,  polyp 12, diverticulosis  . COLONOSCOPY WITH PROPOFOL N/A 11/16/2016   Procedure: COLONOSCOPY WITH PROPOFOL;  Surgeon: G[facility] Mayer, MD;  Location: [facility] ENDOSCOPY;  Service: Endoscopy;  Laterality: N/A;  . JOINT REPLACEMENT     totally replacement surgery  . LEFT AND RIGHT HEART CATHETERIZATION WITH CORONARY ANGIOGRAM N/A 02/01/2014   Procedure: LEFT AND RIGHT HEART CATHETERIZATION WITH CORONARY ANGIOGRAM;  Surgeon: Sueanne Margarita, MD;  Location: [facility] CATH LAB;  Service: Cardiovascular;  Laterality: N/A;  . LUMBAR LAMINECTOMY/DECOMPRESSION MICRODISCECTOMY N/A 03/19/2014   Procedure: LUMBAR LAMINECTOMY/DECOMPRESSION MICRODISCECTOMY 4 LEVEL;  Surgeon: Kristeen Miss, MD;  Location: [facility] NEURO ORS;  Service: Neurosurgery;  Laterality:  N/A;  L1-2 L2-3 L3-4 L4-5 Laminectomy/Foraminotomy  . MASTECTOMY  July 2002   left  . port a cath placed  2002  . PORT-A-CATH REMOVAL  2003  . TEE WITHOUT CARDIOVERSION N/A 11/12/2013   Procedure: TRANSESOPHAGEAL ECHOCARDIOGRAM (TEE);  Surgeon: [place] Headings, MD;  Location: [facility];  Service: Cardiovascular;  Laterality: N/A;  pt b/p low, pt buccal membranes very dry, lips scapped, pt c/o thirst. NPO since MN and iv FLUIDS TOTAL INFUSING AT TOTAL 20ML HR....  Dr. Cathie Olden order allow NS to bolus during procedure....very dry,NS bolus 250 ml total..pt responding well to meds..  . TOTAL KNEE ARTHROPLASTY  2006  . TOTAL KNEE ARTHROPLASTY Right 05/10/2016   Procedure: RIGHT TOTAL KNEE ARTHROPLASTY;  Surgeon: Gaynelle Arabian, MD;  [facility]: [facility] ORS;  Service: Orthopedics;  Laterality: Right;  With adductor block  . TUBAL LIGATION      Family History  Problem Relation Age of Onset  . Dementia Sister   . Diabetes Sister   . Alzheimer's disease Sister   . Diabetes Brother     x 2  . Heart disease Brother   . Irritable bowel syndrome Brother   . Liver cancer Mother   . Diabetes Mother   . Pancreatic cancer Mother   . Diabetes Father   . Heart disease Father     Allergies  Allergen Reactions  . Ambien [Zolpidem] Other (See Comments)    ? Hallucinations, up walking around  . Codeine Phosphate Nausea And Vomiting  . Medrol [Methylprednisolone] Other (See Comments)    Felt really weird w the high dose oral steroid, but tolerates low doses or oral steroid    Current Outpatient Prescriptions on File Prior to Visit  Medication Sig Dispense Refill  . atorvastatin (LIPITOR) 40 MG tablet TAKE 1 TABLET (40 MG TOTAL) BY MOUTH AT BEDTIME. 90 tablet 3  . Cholecalciferol (VITAMIN D3) 2000 units capsule Take 2,000 Units by mouth daily.    . fluticasone (FLONASE) 50 MCG/ACT nasal spray Place 1 spray into both nostrils daily as needed for allergies.    . furosemide (LASIX) 20 MG tablet TAKE 2 TABLETS  (40 MG TOTAL) BY MOUTH DAILY. 180 tablet 3  . KLOR-CON M20 20 MEQ tablet TAKE 1 TABLET (20 MEQ TOTAL) BY MOUTH DAILY. 180 tablet 3  . losartan (COZAAR) 25 MG tablet Take 1 tablet (25 mg total) by mouth 2 (two) times daily. 180 tablet 2  . metFORMIN (GLUCOPHAGE) 500 MG tablet TAKE 1 TABLET TWICE A DAY WITH A MEAL 180 tablet 1  . nebivolol (BYSTOLIC) 2.5 MG tablet Take 1 tablet (2.5 mg total) by mouth daily. 30 tablet 11  . spironolactone (ALDACTONE) 25 MG tablet TAKE 1 TABLET (25 MG TOTAL) BY MOUTH DAILY. 90 tablet 3  . XARELTO 20 MG TABS tablet TAKE 1 TABLET (20 MG  TOTAL) BY MOUTH DAILY WITH SUPPER. 30 tablet 6   No current facility-administered medications on file prior to visit.     BP 128/68 (BP Location: Right Arm, Patient Position: Sitting, Cuff Size: Normal)   Pulse 80   Temp 97.7 F (36.5 C) (Oral)   Ht 5' 5.5" (1.664 m)   Wt 254 lb 3.2 oz (115.3 kg)   SpO2 96%   BMI 41.66 kg/m   Medicare wellness visit  1. Risk factors, based on past  M,S,F history.  Cardio vascular risk factors include a history of hypertension and type 2 diabetes, as well as dyslipidemia  2.  Physical activities:she walks most days of the week  3.  Depression/mood:no history of major depression or mood disorder.  States that she rarely becomes tearful  4.  Hearing:minimal deficits  5.  ADL's:independent  6.  Fall risk:oderate due to weight  7.  Home safety:no problems identified  8.  Height weight, and visual acuity;height and weight stable no change in visual acuity  9.  Counseling:weight loss and more rigorous exercise regimen encouraged  10. Lab orders based on risk factors:we'll check laboratory update including hemoglobin A1c  11. Referral :follow-up cardiology and ophthalmology  12. Care plan:continue effort at aggressive risk factor modification  13. Cognitive assessment: alert and oriented with normal affect no cognitive dysfunction  14. Screening: Patient provided with a written and  personalized 5-10 year screening schedule in the AVS.    15. Provider List Update: includes primary care ophthalmology GI and cardiology   Review of Systems  Constitutional: Negative for appetite change, fatigue, fever and unexpected weight change.  HENT: Negative for congestion, dental problem, ear pain, hearing loss, mouth sores, nosebleeds, sinus pressure, sore throat, tinnitus, trouble swallowing and voice change.   Eyes: Negative for photophobia, pain, redness and visual disturbance.  Respiratory: Negative for cough, chest tightness and shortness of breath.   Cardiovascular: Negative for chest pain, palpitations and leg swelling.  Gastrointestinal: Negative for abdominal distention, abdominal pain, blood in stool, constipation, diarrhea, nausea, rectal pain and vomiting.  Genitourinary: Negative for difficulty urinating, dysuria, flank pain, frequency, genital sores, hematuria, menstrual problem, pelvic pain, urgency, vaginal bleeding, vaginal discharge and vaginal pain.  Musculoskeletal: Negative for arthralgias, back pain and neck stiffness.  Skin: Positive for rash.       Dry scaly feet  Neurological: Negative for dizziness, syncope, speech difficulty, weakness, light-headedness, numbness and headaches.  Hematological: Negative for adenopathy. Does not bruise/bleed easily.  Psychiatric/Behavioral: Negative for agitation, behavioral problems, dysphoric mood, self-injury and suicidal ideas. The patient is not nervous/anxious.        Objective:   Physical Exam  Constitutional: She is oriented to person, place, and time. She appears well-developed and well-nourished.  Weight 254 Blood pressure well controlled   HENT:  Head: Normocephalic and atraumatic.  Right Ear: External ear normal.  Left Ear: External ear normal.  Mouth/Throat: Oropharynx is clear and moist.  Eyes: Conjunctivae and EOM are normal.  Neck: Normal range of motion. Neck supple. No JVD present. No thyromegaly  present.  Cardiovascular: Normal rate, regular rhythm and normal heart sounds.   No murmur heard. Pedal pulses not easily palpable  Pulmonary/Chest: Effort normal and breath sounds normal. She has no wheezes. She has no rales.  Abdominal: Soft. Bowel sounds are normal. She exhibits no distension and no mass. There is no tenderness. There is no rebound and no guarding.  Musculoskeletal: Normal range of motion. She exhibits no edema or tenderness.  Status post right total knee replacement surgery  Neurological: She is alert and oriented to person, place, and time. She has normal reflexes. No cranial nerve deficit. She exhibits normal muscle tone. Coordination normal.  Skin: Skin is warm and dry. No rash noted.  Skin of the feet especially the soles dry and flaky Feet are slightly cool and cyanotic  Psychiatric: She has a normal mood and affect. Her behavior is normal.          Assessment & Plan:   Preventive health examination Medicare wellness visit Paroxysmal atrial fibrillation.  Continue anticoagulation and cardiology follow-up Essential hypertension Chronic combined systolic and diastolic heart failure.  Compensated Type 2 diabetes.  Will review hemoglobin A1c and urine for microalbumin Probable tenia pedis.  Will treat with 3 weeks of topical antifungal medication  Follow-up cardiology Review lab Follow-up here 6 months  [place]

## 2017-02-03 NOTE — Progress Notes (Signed)
Pre visit review using our clinic review tool, if applicable. No additional management support is needed unless otherwise documented below in the visit note. 

## 2017-02-14 ENCOUNTER — Encounter: Payer: Self-pay | Admitting: Cardiology

## 2017-02-14 ENCOUNTER — Ambulatory Visit (INDEPENDENT_AMBULATORY_CARE_PROVIDER_SITE_OTHER): Payer: Medicare Other | Admitting: Cardiology

## 2017-02-14 ENCOUNTER — Ambulatory Visit: Payer: Medicare Other | Admitting: Cardiology

## 2017-02-14 VITALS — BP 126/86 | HR 89 | Ht 65.0 in | Wt 251.0 lb

## 2017-02-14 DIAGNOSIS — I5042 Chronic combined systolic (congestive) and diastolic (congestive) heart failure: Secondary | ICD-10-CM | POA: Diagnosis not present

## 2017-02-14 DIAGNOSIS — I481 Persistent atrial fibrillation: Secondary | ICD-10-CM | POA: Diagnosis not present

## 2017-02-14 DIAGNOSIS — I10 Essential (primary) hypertension: Secondary | ICD-10-CM | POA: Diagnosis not present

## 2017-02-14 DIAGNOSIS — E785 Hyperlipidemia, unspecified: Secondary | ICD-10-CM

## 2017-02-14 DIAGNOSIS — I420 Dilated cardiomyopathy: Secondary | ICD-10-CM

## 2017-02-14 DIAGNOSIS — I2510 Atherosclerotic heart disease of native coronary artery without angina pectoris: Secondary | ICD-10-CM

## 2017-02-14 DIAGNOSIS — I4819 Other persistent atrial fibrillation: Secondary | ICD-10-CM

## 2017-02-14 NOTE — Patient Instructions (Signed)

## 2017-02-14 NOTE — Progress Notes (Signed)
Cardiology Office Note    Date:  02/14/2017   ID:  Cynthia Herrera, DOB [birth year], MRN [id number]  PCP:  Nyoka Cowden, MD  Cardiologist:  [name], MD   Chief Complaint  Patient presents with  . Coronary Artery Disease  . Hypertension  . Hyperlipidemia  . Atrial Fibrillation    History of Present Illness:  Cynthia Herrera is a 71 y.o. female with a history of ASCAD (coronary artery calcifications on CT scan and on cath with no obstructive lesions), nonischemic dilated CM EF now 50-55%, HTN, dyslipidemia, chronic anticoagulation, chronic systolic CHF and persistent atrial fibrillation s/p TEE/DCCV to NSR but failed to hold NSR. She was again cardioverted on 1/21 after more loading with amio but failed to convert. Later that day she spontaneously converted to NSR. She stopped amio due to severe dizziness. She has been maintaining NSR.  She is here today for followup and is doing well. She denies any chest pain, palpitations, dizziness or syncope.Occasionally she will have some LE edema and thinks that is worse recently.  She does use some table salt. Her dizziness resolved off amio.  She has some very mild SOB that does not really bother her much.     Past Medical History:  Diagnosis Date  . Allergy    seasonal  . Arthritis   . Breast cancer ([place]) 2002   left: surgery,chemo,radiation  . Cataracts, bilateral    immature  . Chronic back pain    lumbar stenosis  . Chronic systolic CHF (congestive heart failure) (HCC)    takes Furosemide and Aldactone daily  . COPD (chronic obstructive pulmonary disease) (HCC)    no inhalers   . DCM (dilated cardiomyopathy) ([place])    EF 15-20% ? tachycardia induced - EF 50-55% on echo 2015  . Diabetes mellitus without complication ([place])    recent dx  . Gallstones   . GERD (gastroesophageal reflux disease)    once in a while;depends on what she eats  . History of bronchitis 15+yrs ago  . Hyperlipidemia   . Hypertension      takes Losartan and Metoprolol.   . Joint pain   . Muscle spasm    takes Flexeril daily as needed   . Nocturia   . Osteopenia   . Persistent atrial fibrillation ([place])    s/p TEE DCCV and repeat DCCV 11/14/2013  . Personal history of colonic polyps    adenomas 03 and 08  . Vitamin D deficiency    takes Vit D    Past Surgical History:  Procedure Laterality Date  . BACK SURGERY  01/2014  . BREAST LUMPECTOMY  2002   left breast  . CARDIAC CATHETERIZATION  2015  . CARDIOVERSION N/A 11/12/2013   Procedure: CARDIOVERSION;  Surgeon: [place] Headings, MD;  Location: [facility] ENDOSCOPY;  Service: Cardiovascular;  Laterality: N/A;  10:08  Dr. Marissa Nestle, anesthesia present, Lido   60mg ,  propofol 50mg , IV for elective cardioversion....Dr. Cathie Olden delievered synch 120 joules with successful cardioversion to NSR  . CARDIOVERSION N/A 11/12/2013   Procedure: CARDIOVERSION;  Surgeon: [place] Headings, MD;  Location: [facility];  Service: Cardiovascular;  Laterality: N/A;  . CARDIOVERSION N/A 11/14/2013   Procedure: CARDIOVERSION (BEDSIDE);  Surgeon: [place]arey Dresser, MD;  Location: [facility];  Service: Cardiovascular;  Laterality: N/A;  . CHOLECYSTECTOMY  2004  . COLONOSCOPY  2003, September 2008, April 01, 2011   adenomas 03 and 08, polyp 12, diverticulosis  . COLONOSCOPY WITH PROPOFOL N/A 11/16/2016  Procedure: COLONOSCOPY WITH PROPOFOL;  Surgeon: [place] [facility], MD;  Location: [facility] ENDOSCOPY;  Service: Endoscopy;  Laterality: N/A;  . JOINT REPLACEMENT     totally replacement surgery  . LEFT AND RIGHT HEART CATHETERIZATION WITH CORONARY ANGIOGRAM N/A 02/01/2014   Procedure: LEFT AND RIGHT HEART CATHETERIZATION WITH CORONARY ANGIOGRAM;  Surgeon: Sueanne Margarita, MD;  Location: [facility] CATH LAB;  Service: Cardiovascular;  Laterality: N/A;  . LUMBAR LAMINECTOMY/DECOMPRESSION MICRODISCECTOMY N/A 03/19/2014   Procedure: LUMBAR LAMINECTOMY/DECOMPRESSION MICRODISCECTOMY 4 LEVEL;  Surgeon: [place]een Miss, MD;  Location: [facility] NEURO  ORS;  Service: Neurosurgery;  Laterality: N/A;  L1-2 L2-3 L3-4 L4-5 Laminectomy/Foraminotomy  . MASTECTOMY  July 2002   left  . port a cath placed  2002  . PORT-A-CATH REMOVAL  2003  . TEE WITHOUT CARDIOVERSION N/A 11/12/2013   Procedure: TRANSESOPHAGEAL ECHOCARDIOGRAM (TEE);  Surgeon: [place] Headings, MD;  Location: [facility];  Service: Cardiovascular;  Laterality: N/A;  pt b/p low, pt buccal membranes very dry, lips scapped, pt c/o thirst. NPO since MN and iv FLUIDS TOTAL INFUSING AT TOTAL 20ML HR....  Dr. Cathie Olden order allow NS to bolus during procedure....very dry,NS bolus 250 ml total..pt responding well to meds..  . TOTAL KNEE ARTHROPLASTY  2006  . TOTAL KNEE ARTHROPLASTY Right 05/10/2016   Procedure: RIGHT TOTAL KNEE ARTHROPLASTY;  Surgeon: Gaynelle Arabian, MD;  Location: [facility] ORS;  Service: Orthopedics;  Laterality: Right;  With adductor block  . TUBAL LIGATION      Current Medications: Current Meds  Medication Sig  . atorvastatin (LIPITOR) 40 MG tablet TAKE 1 TABLET (40 MG TOTAL) BY MOUTH AT BEDTIME.  Marland Kitchen Cholecalciferol (VITAMIN D3) 2000 units capsule Take 2,000 Units by mouth daily.  . fluticasone (FLONASE) 50 MCG/ACT nasal spray Place 1 spray into both nostrils daily as needed for allergies.  . furosemide (LASIX) 20 MG tablet TAKE 2 TABLETS (40 MG TOTAL) BY MOUTH DAILY.  Marland Kitchen KLOR-CON M20 20 MEQ tablet TAKE 1 TABLET (20 MEQ TOTAL) BY MOUTH DAILY.  Marland Kitchen losartan (COZAAR) 25 MG tablet Take 1 tablet (25 mg total) by mouth 2 (two) times daily.  . metFORMIN (GLUCOPHAGE) 500 MG tablet TAKE 1 TABLET TWICE A DAY WITH A MEAL  . nebivolol (BYSTOLIC) 2.5 MG tablet Take 1 tablet (2.5 mg total) by mouth daily.  Marland Kitchen spironolactone (ALDACTONE) 25 MG tablet TAKE 1 TABLET (25 MG TOTAL) BY MOUTH DAILY.  Marland Kitchen XARELTO 20 MG TABS tablet TAKE 1 TABLET (20 MG TOTAL) BY MOUTH DAILY WITH SUPPER.    Allergies:   Ambien [zolpidem]; Codeine phosphate; and Medrol [methylprednisolone]   Social History   Social History   . Marital status: Widowed    Spouse name: N/A  . Number of children: 2  . Years of education: N/A   Occupational History  . retired    Social History Main Topics  . Smoking status: Former Smoker    Quit date: 08/24/2001  . Smokeless tobacco: Never Used  . Alcohol use No  . Drug use: No  . Sexual activity: Not Asked   Other Topics Concern  . None   Social History Narrative  . None     Family History:  The patient's family history includes Alzheimer's disease in her sister; Dementia in her sister; Diabetes in her brother, father, mother, and sister; Heart disease in her brother and father; Irritable bowel syndrome in her brother; Liver cancer in her mother; Pancreatic cancer in her mother.   ROS:   Please see the history of  present illness.    ROS All other systems reviewed and are negative.  No flowsheet data found.     PHYSICAL EXAM:   VS:  BP 126/86   Pulse 89   Ht 5\' 5"  (1.651 m)   Wt 251 lb (113.9 kg)   SpO2 96%   BMI 41.77 kg/m    GEN: Well nourished, well developed, in no acute distress  HEENT: normal  Neck: no JVD, carotid bruits, or masses Cardiac: RRR; no murmurs, rubs, or gallops,no edema.  Intact distal pulses bilaterally.  Respiratory:  clear to auscultation bilaterally, normal work of breathing GI: soft, nontender, nondistended, + BS MS: no deformity or atrophy  Skin: warm and dry, no rash Neuro:  Alert and Oriented x 3, Strength and sensation are intact Psych: euthymic mood, full affect  Wt Readings from Last 3 Encounters:  02/14/17 251 lb (113.9 kg)  02/03/17 254 lb 3.2 oz (115.3 kg)  11/16/16 249 lb (112.9 kg)      Studies/Labs Reviewed:   EKG:  EKG is not ordered today.    Recent Labs: 02/03/2017: ALT 17; BUN 18; Creatinine, Ser 0.94; Hemoglobin 13.0; Platelets 315.0; Potassium 3.8; Sodium 140; TSH 0.86   Lipid Panel    Component Value Date/Time   CHOL 119 02/03/2017 1207   TRIG 115.0 02/03/2017 1207   HDL 42.60 02/03/2017 1207     CHOLHDL 3 02/03/2017 1207   VLDL 23.0 02/03/2017 1207   LDLCALC 54 02/03/2017 1207    Additional studies/ records that were reviewed today include:  none    ASSESSMENT:    1. Persistent atrial fibrillation ([place])   2. Coronary artery calcification seen on CAT scan   3. Essential hypertension   4. DCM (dilated cardiomyopathy) ([place])   5. Chronic combined systolic and diastolic CHF (congestive heart failure) ([place])   6. Dyslipidemia      PLAN:  In order of problems listed above:  1. Persistent atrial fibrillation - she is maintaining NSR and will continue on Bystolic and xarelto.  BMET and CBC was normal this month. 2. Coronary artery calcifications on chest CT with no significant CAD on cath.  She will continue statin, BB. She is not on ASA due to NOAC. 3. HTN - BP is adequately controlled today on exam.  She will continue on BB, aldactone, ARB. 4. DCM EF 50-55% by echo 08/2014. 5. Chronic combined systolic/diastolic CHF - she appears euvolemic on exam today and her weight has been stable. She will continue on BB, ARB, aldactone and Lasix. She does intermittently had some mild LE edema although none on exam today.  I encouraged her to be more compliant with a low sodium diet and I told her she could take an additional Lasix PRN for increased edema.  Her BMET last week was normal.   6. Dyslipidemia - she will continue on statin.  Her lipids are at goal with LDL at 54.      Medication Adjustments/Labs and Tests Ordered: Current medicines are reviewed at length with the patient today.  Concerns regarding medicines are outlined above.  Medication changes, Labs and Tests ordered today are listed in the Patient Instructions below.  There are no Patient Instructions on file for this visit.   Signed, [name], MD  02/14/2017 1:29 PM    [facility] [address], [place], [place]  [address] Phone: [phone]; Fax: [phone]

## 2017-03-14 ENCOUNTER — Other Ambulatory Visit: Payer: Self-pay | Admitting: Cardiology

## 2017-03-21 ENCOUNTER — Other Ambulatory Visit: Payer: Self-pay | Admitting: Cardiology

## 2017-03-22 ENCOUNTER — Ambulatory Visit (HOSPITAL_COMMUNITY)
Admission: RE | Admit: 2017-03-22 | Discharge: 2017-03-22 | Disposition: A | Discharge status: Home / Self Care | Payer: Medicare Other | Source: Ambulatory Visit | Attending: Vascular Surgery | Admitting: Vascular Surgery

## 2017-03-22 DIAGNOSIS — I10 Essential (primary) hypertension: Secondary | ICD-10-CM | POA: Insufficient documentation

## 2017-03-22 DIAGNOSIS — E119 Type 2 diabetes mellitus without complications: Secondary | ICD-10-CM | POA: Insufficient documentation

## 2017-03-22 DIAGNOSIS — Z0000 Encounter for general adult medical examination without abnormal findings: Secondary | ICD-10-CM

## 2017-03-22 DIAGNOSIS — E785 Hyperlipidemia, unspecified: Secondary | ICD-10-CM | POA: Diagnosis not present

## 2017-03-22 LAB — VAS US LOWER EXTREMITY ARTERIAL DUPLEX
LSFDPSV: -108 cm/s
LSFMPSV: -77 cm/s
Left ant tibial distal sys: 158 cm/s
Left super femoral prox sys PSV: -123 cm/s
RATIBDISTSYS: 101 cm/s
RSFMPSV: -70 cm/s
RTIBDISTSYS: 118 cm/s
Right super femoral dist sys PSV: -75 cm/s
Right super femoral prox sys PSV: 266 cm/s
left post tibial dist sys: 146 cm/s

## 2017-03-23 ENCOUNTER — Other Ambulatory Visit: Payer: Self-pay | Admitting: Cardiology

## 2017-03-28 NOTE — Addendum Note (Signed)
Addendum  created 03/28/17 1019 by Oleta Mouse, MD   Sign clinical note

## 2017-04-01 ENCOUNTER — Other Ambulatory Visit: Payer: Self-pay | Admitting: Cardiology

## 2017-04-02 ENCOUNTER — Other Ambulatory Visit: Payer: Self-pay | Admitting: Cardiology

## 2017-06-23 ENCOUNTER — Other Ambulatory Visit: Payer: Self-pay | Admitting: Internal Medicine

## 2017-06-23 DIAGNOSIS — N6320 Unspecified lump in the left breast, unspecified quadrant: Secondary | ICD-10-CM

## 2017-06-23 DIAGNOSIS — R921 Mammographic calcification found on diagnostic imaging of breast: Secondary | ICD-10-CM

## 2017-06-25 HISTORY — PX: BREAST BIOPSY: SHX20

## 2017-07-11 ENCOUNTER — Ambulatory Visit
Admission: RE | Admit: 2017-07-11 | Discharge: 2017-07-11 | Disposition: A | Discharge status: Home / Self Care | Payer: Medicare Other | Source: Ambulatory Visit | Attending: Internal Medicine | Admitting: Internal Medicine

## 2017-07-11 ENCOUNTER — Other Ambulatory Visit: Payer: Self-pay | Admitting: Internal Medicine

## 2017-07-11 DIAGNOSIS — N6489 Other specified disorders of breast: Secondary | ICD-10-CM | POA: Diagnosis not present

## 2017-07-11 DIAGNOSIS — R921 Mammographic calcification found on diagnostic imaging of breast: Secondary | ICD-10-CM | POA: Diagnosis not present

## 2017-07-11 DIAGNOSIS — N6320 Unspecified lump in the left breast, unspecified quadrant: Secondary | ICD-10-CM

## 2017-07-14 ENCOUNTER — Ambulatory Visit
Admission: RE | Admit: 2017-07-14 | Discharge: 2017-07-14 | Disposition: A | Discharge status: Home / Self Care | Payer: Medicare Other | Source: Ambulatory Visit | Attending: Internal Medicine | Admitting: Internal Medicine

## 2017-07-14 ENCOUNTER — Other Ambulatory Visit: Payer: Self-pay | Admitting: Internal Medicine

## 2017-07-14 DIAGNOSIS — R921 Mammographic calcification found on diagnostic imaging of breast: Secondary | ICD-10-CM

## 2017-07-14 DIAGNOSIS — N6011 Diffuse cystic mastopathy of right breast: Secondary | ICD-10-CM | POA: Diagnosis not present

## 2017-07-18 DIAGNOSIS — H40013 Open angle with borderline findings, low risk, bilateral: Secondary | ICD-10-CM | POA: Diagnosis not present

## 2017-07-18 DIAGNOSIS — E119 Type 2 diabetes mellitus without complications: Secondary | ICD-10-CM | POA: Diagnosis not present

## 2017-07-18 DIAGNOSIS — H35033 Hypertensive retinopathy, bilateral: Secondary | ICD-10-CM | POA: Diagnosis not present

## 2017-07-18 DIAGNOSIS — Z961 Presence of intraocular lens: Secondary | ICD-10-CM | POA: Diagnosis not present

## 2017-07-18 LAB — HM DIABETES EYE EXAM

## 2017-07-20 ENCOUNTER — Encounter: Payer: Self-pay | Admitting: Internal Medicine

## 2017-07-29 ENCOUNTER — Ambulatory Visit: Payer: Self-pay | Admitting: General Surgery

## 2017-07-29 ENCOUNTER — Other Ambulatory Visit: Payer: Self-pay | Admitting: Cardiology

## 2017-07-29 DIAGNOSIS — N6021 Fibroadenosis of right breast: Secondary | ICD-10-CM

## 2017-07-29 MED ORDER — NEBIVOLOL HCL 2.5 MG PO TABS: 2.5000 mg | ORAL_TABLET | Freq: Every day | ORAL | 6 refills | Status: DC

## 2017-08-01 ENCOUNTER — Telehealth: Payer: Self-pay

## 2017-08-01 NOTE — Telephone Encounter (Signed)
    [facility] Pre-operative Risk Assessment    Request for surgical clearance:  1. What type of surgery is being performed? Breast lumpectomy  2. When is this surgery scheduled? TBD/ NEAR FUTURE.  3. Are there any medications that need to be held prior to surgery and how long? The patient is currently on xarelto so instructions are needed as to how patient should hold medication preoperatively.  PLEASE ADVISE  4. Practice name and name of physician performing surgery? [facility]. Dr. [name].  5. What is your office phone and fax number? P: [phone]. 6. F: [phone].  7. Anesthesia type (None, local, MAC, general) ? GENERAL   Stephannie Peters 08/01/2017, 5:10 PM  _________________________________________________________________   (provider comments below)

## 2017-08-02 ENCOUNTER — Telehealth: Payer: Self-pay | Admitting: Cardiology

## 2017-08-02 NOTE — Telephone Encounter (Signed)
Pt has risk of 0 RCRI risk calculator.       Chart reviewed as part of pre-operative protocol coverage. Given past medical history and time since last visit, based on ACC/AHA guidelines, Cynthia Herrera would be at acceptable risk for the planned procedure without further cardiovascular testing.   Cecilie Kicks, NP 08/02/2017, 3:52 PM   Please send Pharmacy recommendations for Xarelto as well.

## 2017-08-02 NOTE — Telephone Encounter (Signed)
Pt takes Xarelto for afib with CHADS2VASc of 6 (age, sex, CHF, DM, HTN, CAD). Renal function is normal. Ok to hold Xarelto for 2 days prior.

## 2017-08-02 NOTE — Telephone Encounter (Signed)
New MEeage  Pt call requesting to speak with RN to f/u on surgical clearance faxed over from [facility]. Please call back to discuss

## 2017-08-02 NOTE — Telephone Encounter (Signed)
Spoke with pt and she wanted to know if clearance had been sent to [facility] yet.  Advised pt that our [facility] responded this morning at 8:12A and was sent over.  Pt appreciative for call and will contact [facility].

## 2017-08-03 ENCOUNTER — Other Ambulatory Visit: Payer: Self-pay | Admitting: General Surgery

## 2017-08-03 ENCOUNTER — Encounter: Payer: Self-pay | Admitting: Internal Medicine

## 2017-08-03 DIAGNOSIS — N6021 Fibroadenosis of right breast: Secondary | ICD-10-CM

## 2017-08-08 NOTE — Pre-Procedure Instructions (Addendum)
Cynthia Herrera  08/08/2017      [id number] - , [place] - [address]. AT [address]. [place] [address] Phone: [phone] Fax: [phone]    Your procedure is scheduled on Monday, October 22nd   Report to [facility] Admitting at 9:45 AM             (posted surgery time 11:45a - 1:45p)   Call this number if you have problems the MORNING of surgery:  [phone]   Remember:              4-5 days prior to surgery, STOP TAKING any Vitamins, Herbal Supplements, Anti-inflammatories.   Do not eat food or drink liquids after midnight Sunday.              However, you will need to drink your 8 oz of water by 7:45am   Take these medicines the morning of surgery with A SIP OF WATER : Nebivolol.              Xarelto will be stopped______________________   Do not wear jewelry, make-up or nail polish.  Do not wear lotions, powders,  perfumes, or deoderant.  Do not shave 48 hours prior to surgery.   Do not bring valuables to the hospital.  [facility] is not responsible for any belongings or valuables.  Contacts, dentures or bridgework may not be worn into surgery.  Leave your suitcase in the car.  After surgery it may be brought to your room.  For patients admitted to the hospital, discharge time will be determined by your treatment team.  Patients discharged the day of surgery will not be allowed to drive home, and you will need someone to stay with you for the first 24 hrs after.  Please read over the following fact sheets that you were given. Pain Booklet and Surgical Site Infection Prevention        How to Manage Your Diabetes Before and After Surgery  Why is it important to control my blood sugar before and after surgery? . Improving blood sugar levels before and after surgery helps healing and can limit problems. . A way of improving blood sugar control is eating a healthy diet  by: o  Eating less sugar and carbohydrates o  Increasing activity/exercise o  Talking with your doctor about reaching your blood sugar goals . High blood sugars (greater than 180 mg/dL) can raise your risk of infections and slow your recovery, so you will need to focus on controlling your diabetes during the weeks before surgery. . Make sure that the doctor who takes care of your diabetes knows about your planned surgery including the date and location.  How do I manage my blood sugar before surgery? . Check your blood sugar at least 4 times a day, starting 2 days before surgery, to make sure that the level is not too high or low. o Check your blood sugar the morning of your surgery when you wake up and every 2 hours until you get to the Short Stay unit. o  . If your blood sugar is less than 70 mg/dL, you will need to treat for low blood sugar: o Do not take insulin. o Treat a low blood sugar (less than 70 mg/dL) with  cup of clear juice (cranberry or apple), 4 glucose tablets, OR glucose gel. o  o Recheck blood sugar in 15 minutes after treatment (to make sure  it is greater than 70 mg/dL). If your blood sugar is not greater than 70 mg/dL on recheck, call [phone] for further instructions. . Report your blood sugar to the short stay nurse when you get to Short Stay.  . If you are admitted to the hospital after surgery: o Your blood sugar will be checked by the staff and you will probably be given insulin after surgery (instead of oral diabetes medicines) to make sure you have good blood sugar levels. o The goal for blood sugar control after surgery is 80-180 mg/dL.  WHAT DO I DO ABOUT MY DIABETES MEDICATION?   Marland Kitchen Do not take oral diabetes medicines (pills) the morning of surgery  . The day of surgery, do not take other diabetes injectables, including Byetta (exenatide), Bydureon (exenatide ER), Victoza (liraglutide), or Trulicity (dulaglutide).  . If your CBG is greater than 220  mg/dL, you may take  of your sliding scale (correction) dose of insulin.  Other Instructions:          Patient Signature:  Date:   Nurse Signature:  Date:      [facility]- Preparing For Surgery  Before surgery, you can play an important role. Because skin is not sterile, your skin needs to be as free of germs as possible. You can reduce the number of germs on your skin by washing with CHG (chlorahexidine gluconate) Soap before surgery.  CHG is an antiseptic cleaner which kills germs and bonds with the skin to continue killing germs even after washing.  Please do not use if you have an allergy to CHG or antibacterial soaps. If your skin becomes reddened/irritated stop using the CHG.  Do not shave (including legs and underarms) for at least 48 hours prior to first CHG shower. It is OK to shave your face.  Please follow these instructions carefully.   1. Shower the NIGHT BEFORE SURGERY and the MORNING OF SURGERY with CHG.   2. If you chose to wash your hair, wash your hair first as usual with your normal shampoo.  3. After you shampoo, rinse your hair and body thoroughly to remove the shampoo.  4. Use CHG as you would any other liquid soap. You can apply CHG directly to the skin and wash gently with a scrungie or a clean washcloth.   5. Apply the CHG Soap to your body ONLY FROM THE NECK DOWN.  Do not use on open wounds or open sores. Avoid contact with your eyes, ears, mouth and genitals (private parts). Wash Face and genitals (private parts)  with your normal soap.  6. Wash thoroughly, paying special attention to the area where your surgery will be performed.  7. Thoroughly rinse your body with warm water from the neck down.  8. DO NOT shower/wash with your normal soap after using and rinsing off the CHG Soap.  9. Pat yourself dry with a CLEAN TOWEL.  10. Wear CLEAN PAJAMAS to bed the night before surgery, wear comfortable clothes the morning of surgery  11. Place CLEAN  SHEETS on your bed the night of your first shower and DO NOT SLEEP WITH PETS.    Day of Surgery: Do not apply any deodorants/lotions. Please wear clean clothes to the hospital/surgery center.

## 2017-08-09 ENCOUNTER — Encounter (HOSPITAL_COMMUNITY): Payer: Self-pay

## 2017-08-09 ENCOUNTER — Encounter (HOSPITAL_COMMUNITY)
Admission: RE | Admit: 2017-08-09 | Discharge: 2017-08-09 | Disposition: A | Discharge status: Home / Self Care | Payer: Medicare Other | Source: Ambulatory Visit | Attending: General Surgery | Admitting: General Surgery

## 2017-08-09 DIAGNOSIS — Z01818 Encounter for other preprocedural examination: Secondary | ICD-10-CM | POA: Insufficient documentation

## 2017-08-09 DIAGNOSIS — N6489 Other specified disorders of breast: Secondary | ICD-10-CM | POA: Diagnosis not present

## 2017-08-09 HISTORY — DX: Pneumonia, unspecified organism: J18.9

## 2017-08-09 HISTORY — DX: Other complications of anesthesia, initial encounter: T88.59XA

## 2017-08-09 HISTORY — DX: Adverse effect of unspecified anesthetic, initial encounter: T41.45XA

## 2017-08-09 LAB — BASIC METABOLIC PANEL
Anion gap: 7 (ref 5–15)
BUN: 15 mg/dL (ref 6–20)
CHLORIDE: 105 mmol/L (ref 101–111)
CO2: 26 mmol/L (ref 22–32)
CREATININE: 1.14 mg/dL — AB (ref 0.44–1.00)
Calcium: 9.2 mg/dL (ref 8.9–10.3)
GFR calc non Af Amer: 47 mL/min — ABNORMAL LOW (ref >60)
GFR, EST AFRICAN AMERICAN: 55 mL/min — AB (ref >60)
Glucose, Bld: 151 mg/dL — ABNORMAL HIGH (ref 65–99)
Potassium: 3.6 mmol/L (ref 3.5–5.1)
Sodium: 138 mmol/L (ref 135–145)

## 2017-08-09 LAB — CBC
HEMATOCRIT: 37.6 % (ref 36.0–46.0)
HEMOGLOBIN: 12.1 g/dL (ref 12.0–15.0)
MCH: 29.1 pg (ref 26.0–34.0)
MCHC: 32.2 g/dL (ref 30.0–36.0)
MCV: 90.4 fL (ref 78.0–100.0)
Platelets: 308 10*3/uL (ref 150–400)
RBC: 4.16 MIL/uL (ref 3.87–5.11)
RDW: 14.2 % (ref 11.5–15.5)
WBC: 8.5 10*3/uL (ref 4.0–10.5)

## 2017-08-09 LAB — HEMOGLOBIN A1C
HEMOGLOBIN A1C: 6 % — AB (ref 4.8–5.6)
MEAN PLASMA GLUCOSE: 125.5 mg/dL

## 2017-08-09 LAB — GLUCOSE, CAPILLARY: GLUCOSE-CAPILLARY: 144 mg/dL — AB (ref 65–99)

## 2017-08-09 NOTE — Pre-Procedure Instructions (Addendum)
Cynthia Herrera  08/09/2017      [id number] - [place], [place] - [address]. AT [address]. [place] [address] Phone: [phone] Fax: [phone]    Your procedure is scheduled on Monday, October 22nd   Report to [facility] Admitting at 9:45 AM             (posted surgery time 11:45a - 1:45p)   Call this number if you have problems the MORNING of surgery:  [phone]   Remember:nothing to eat or drink after midnight sunday  Drink breeze boost 2 hrs prior to [phone]) (drink given to you at preop appt)   take nibivolol(bystolic) with sip of water  No metformin am of surgery  Stop xarelto per dr(2 days-last dose 08/12/17)  starting today 08/09/17, STOP TAKING all Vitamins, Herbal Supplements, Anti-inflammatories.,aspirin    Do not wear jewelry, make-up or nail polish.  Do not wear lotions, powders,  perfumes, or deoderant.  Do not shave 48 hours prior to surgery.   Do not bring valuables to the hospital.  [facility] is not responsible for any belongings or valuables.  Contacts, dentures or bridgework may not be worn into surgery.  Leave your suitcase in the car.  After surgery it may be brought to your room.  For patients admitted to the hospital, discharge time will be determined by your treatment team.  Patients discharged the day of surgery will not be allowed to drive home, and you will need someone to stay with you for the first 24 hrs after.  Please read over the  fact sheets that you were given.    How to Manage Your Diabetes Before and After Surgery  Why is it important to control my blood sugar before and after surgery? . Improving blood sugar levels before and after surgery helps healing and can limit problems. . A way of improving blood sugar control is eating a healthy diet by: o  Eating less sugar and carbohydrates o  Increasing activity/exercise o  Talking with your  doctor about reaching your blood sugar goals . High blood sugars (greater than 180 mg/dL) can raise your risk of infections and slow your recovery, so you will need to focus on controlling your diabetes during the weeks before surgery. . Make sure that the doctor who takes care of your diabetes knows about your planned surgery including the date and location.  How do I manage my blood sugar before surgery? . Check your blood sugar at least 4 times a day, starting 2 days before surgery, to make sure that the level is not too high or low. o Check your blood sugar the morning of your surgery when you wake up and every 2 hours until you get to the Short Stay unit. o  . If your blood sugar is less than 70 mg/dL, you will need to treat for low blood sugar: o Do not take insulin. o Treat a low blood sugar (less than 70 mg/dL) with  cup of clear juice (cranberry or apple), 4 glucose tablets, OR glucose gel. o  o Recheck blood sugar in 15 minutes after treatment (to make sure it is greater than 70 mg/dL). If your blood sugar is not greater than 70 mg/dL on recheck, call [phone] for further instructions. . Report your blood sugar to the short stay nurse when you get to Short Stay.  . If you are admitted to the hospital after surgery:  o Your blood sugar will be checked by the staff and you will probably be given insulin after surgery (instead of oral diabetes medicines) to make sure you have good blood sugar levels. o The goal for blood sugar control after surgery is 80-180 mg/dL.  WHAT DO I DO ABOUT MY DIABETES MEDICATION?   Marland Kitchen Do not take oral diabetes medicines (pills) the morning of surgery(metformin)  [place]- Preparing For Surgery  Before surgery, you can play an important role. Because skin is not sterile, your skin needs to be as free of germs as possible. You can reduce the number of germs on your skin by washing with CHG (chlorahexidine gluconate) Soap before surgery.  CHG is an  antiseptic cleaner which kills germs and bonds with the skin to continue killing germs even after washing.  Please do not use if you have an allergy to CHG or antibacterial soaps. If your skin becomes reddened/irritated stop using the CHG.  Do not shave (including legs and underarms) for at least 48 hours prior to first CHG shower. It is OK to shave your face.  Please follow these instructions carefully.   1. Shower the NIGHT BEFORE SURGERY and the MORNING OF SURGERY with CHG.   2. If you chose to wash your hair, wash your hair first as usual with your normal shampoo.  3. After you shampoo, rinse your hair and body thoroughly to remove the shampoo.  4. Use CHG as you would any other liquid soap. You can apply CHG directly to the skin and wash gently with a scrungie or a clean washcloth.   5. Apply the CHG Soap to your body ONLY FROM THE NECK DOWN.  Do not use on open wounds or open sores. Avoid contact with your eyes, ears, mouth and genitals (private parts). Wash Face and genitals (private parts)  with your normal soap.  6. Wash thoroughly, paying special attention to the area where your surgery will be performed.  7. Thoroughly rinse your body with warm water from the neck down.  8. DO NOT shower/wash with your normal soap after using and rinsing off the CHG Soap.  9. Pat yourself dry with a CLEAN TOWEL.  10. Wear CLEAN PAJAMAS to bed the night before surgery, wear comfortable clothes the morning of surgery  11. Place CLEAN SHEETS on your bed the night of your first shower and DO NOT SLEEP WITH PETS.    Day of Surgery: Do not apply any deodorants/lotions. Please wear clean clothes to the hospital/surgery center.

## 2017-08-10 NOTE — Progress Notes (Signed)
Anesthesia Chart Review:  Pt is a 71 year old female scheduled for R breast lumpectomy with radioactive seed localization, excision of L breast mass on 08/15/2017 with Jovita Kussmaul, MD.   - Cardiologist is [name], MD, last office visit 02/14/17. Pt cleared by Cecilie Kicks, NP 08/02/17 telephone encounter  PMH includes:  CAD (widely patent but calcified coronaries by 2015 cath), dilated cardiomyopathy, chronic CHF, atrial fibrillation, HTN, DM, hyperlipidemia, COPD, breast cancer, GERD. Former smoker. BMI 41. 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 S/p lumbar laminectomy 03/19/14.   Medications include: Lipitor, Lasix, potassium, losartan, metformin, nebivolol, sprionolactone, xarelto. Last dose xarelto 08/12/17  BP (!) 137/55   Pulse 74   Temp 36.5 C   Resp 20   Ht 5\' 6"  (1.676 m)   Wt 253 lb 4.8 oz (114.9 kg)   SpO2 95%   BMI 40.88 kg/m   Preoperative labs reviewed.   - HbA1c 6.0, glucose 151 - PT will be obtained DOS.   EKG will be obtained DOS.   Echo 08/26/14:  - Left ventricle: The cavity size was normal. Wall thickness wasnormal. Systolic function was normal. The estimated ejectionfraction was in the range of 50% to 55%. Wall motion was normal;there were no regional wall motion abnormalities. Dopplerparameters are consistent with abnormal left ventricularrelaxation (grade 1 diastolic dysfunction). - Aortic valve: Valve area (VTI): 2.06 cm^2. Valve area (Vmean):1.67 cm^2.  Cardiac cath 02/01/14: 1. Calcified vessels which are widely patent with no obstructive lesions  2. Mildly LV dysfunction EF 45%  3. Normal right heart pressures  REC: Continue current medications.  If PT and EKG acceptable, I anticipate pt can proceed with surgery as scheduled.   Willeen Cass, FNP-BC [facility] Short Stay Surgical Center/Anesthesiology Phone: [phone] 08/10/2017 4:13 PM

## 2017-08-12 ENCOUNTER — Ambulatory Visit
Admission: RE | Admit: 2017-08-12 | Discharge: 2017-08-12 | Disposition: A | Discharge status: Home / Self Care | Payer: Medicare Other | Source: Ambulatory Visit | Attending: General Surgery | Admitting: General Surgery

## 2017-08-12 DIAGNOSIS — N6021 Fibroadenosis of right breast: Secondary | ICD-10-CM

## 2017-08-12 DIAGNOSIS — R921 Mammographic calcification found on diagnostic imaging of breast: Secondary | ICD-10-CM | POA: Diagnosis not present

## 2017-08-15 ENCOUNTER — Ambulatory Visit (HOSPITAL_COMMUNITY)
Admission: RE | Admit: 2017-08-15 | Discharge: 2017-08-15 | Disposition: A | Discharge status: Home / Self Care | Payer: Medicare Other | Source: Ambulatory Visit | Attending: General Surgery | Admitting: General Surgery

## 2017-08-15 ENCOUNTER — Ambulatory Visit (HOSPITAL_COMMUNITY): Payer: Medicare Other | Admitting: Emergency Medicine

## 2017-08-15 ENCOUNTER — Encounter (HOSPITAL_COMMUNITY): Payer: Self-pay | Admitting: *Deleted

## 2017-08-15 ENCOUNTER — Encounter (HOSPITAL_COMMUNITY)
Admission: RE | Disposition: A | Discharge status: Home / Self Care | Payer: Self-pay | Source: Ambulatory Visit | Attending: General Surgery

## 2017-08-15 ENCOUNTER — Ambulatory Visit (HOSPITAL_COMMUNITY): Payer: Medicare Other | Admitting: Certified Registered"

## 2017-08-15 ENCOUNTER — Ambulatory Visit
Admission: RE | Admit: 2017-08-15 | Discharge: 2017-08-15 | Disposition: A | Discharge status: Home / Self Care | Payer: Medicare Other | Source: Ambulatory Visit | Attending: General Surgery | Admitting: General Surgery

## 2017-08-15 DIAGNOSIS — R928 Other abnormal and inconclusive findings on diagnostic imaging of breast: Secondary | ICD-10-CM | POA: Diagnosis not present

## 2017-08-15 DIAGNOSIS — D241 Benign neoplasm of right breast: Secondary | ICD-10-CM | POA: Diagnosis not present

## 2017-08-15 DIAGNOSIS — I4891 Unspecified atrial fibrillation: Secondary | ICD-10-CM | POA: Diagnosis not present

## 2017-08-15 DIAGNOSIS — C50812 Malignant neoplasm of overlapping sites of left female breast: Secondary | ICD-10-CM | POA: Insufficient documentation

## 2017-08-15 DIAGNOSIS — E7800 Pure hypercholesterolemia, unspecified: Secondary | ICD-10-CM | POA: Insufficient documentation

## 2017-08-15 DIAGNOSIS — N6489 Other specified disorders of breast: Secondary | ICD-10-CM | POA: Insufficient documentation

## 2017-08-15 DIAGNOSIS — E119 Type 2 diabetes mellitus without complications: Secondary | ICD-10-CM | POA: Insufficient documentation

## 2017-08-15 DIAGNOSIS — I10 Essential (primary) hypertension: Secondary | ICD-10-CM | POA: Diagnosis not present

## 2017-08-15 DIAGNOSIS — Z9221 Personal history of antineoplastic chemotherapy: Secondary | ICD-10-CM | POA: Insufficient documentation

## 2017-08-15 DIAGNOSIS — Z923 Personal history of irradiation: Secondary | ICD-10-CM | POA: Insufficient documentation

## 2017-08-15 DIAGNOSIS — C44501 Unspecified malignant neoplasm of skin of breast: Secondary | ICD-10-CM | POA: Diagnosis not present

## 2017-08-15 DIAGNOSIS — J449 Chronic obstructive pulmonary disease, unspecified: Secondary | ICD-10-CM | POA: Diagnosis not present

## 2017-08-15 DIAGNOSIS — Z7984 Long term (current) use of oral hypoglycemic drugs: Secondary | ICD-10-CM | POA: Diagnosis not present

## 2017-08-15 DIAGNOSIS — Z79899 Other long term (current) drug therapy: Secondary | ICD-10-CM | POA: Diagnosis not present

## 2017-08-15 DIAGNOSIS — N6021 Fibroadenosis of right breast: Secondary | ICD-10-CM

## 2017-08-15 DIAGNOSIS — Z87891 Personal history of nicotine dependence: Secondary | ICD-10-CM | POA: Diagnosis not present

## 2017-08-15 DIAGNOSIS — N6081 Other benign mammary dysplasias of right breast: Secondary | ICD-10-CM | POA: Diagnosis not present

## 2017-08-15 DIAGNOSIS — N6011 Diffuse cystic mastopathy of right breast: Secondary | ICD-10-CM | POA: Diagnosis not present

## 2017-08-15 DIAGNOSIS — R921 Mammographic calcification found on diagnostic imaging of breast: Secondary | ICD-10-CM | POA: Diagnosis not present

## 2017-08-15 DIAGNOSIS — C50912 Malignant neoplasm of unspecified site of left female breast: Secondary | ICD-10-CM | POA: Diagnosis not present

## 2017-08-15 DIAGNOSIS — Z7901 Long term (current) use of anticoagulants: Secondary | ICD-10-CM | POA: Diagnosis not present

## 2017-08-15 DIAGNOSIS — N6320 Unspecified lump in the left breast, unspecified quadrant: Secondary | ICD-10-CM | POA: Diagnosis not present

## 2017-08-15 HISTORY — PX: MASS EXCISION: SHX2000

## 2017-08-15 HISTORY — PX: BREAST LUMPECTOMY WITH RADIOACTIVE SEED LOCALIZATION: SHX6424

## 2017-08-15 LAB — GLUCOSE, CAPILLARY
Glucose-Capillary: 162 mg/dL — ABNORMAL HIGH (ref 65–99)
Glucose-Capillary: 106 mg/dL — ABNORMAL HIGH (ref 65–99)

## 2017-08-15 LAB — PROTIME-INR
INR: 1
PROTHROMBIN TIME: 13.1 s (ref 11.4–15.2)

## 2017-08-15 SURGERY — BREAST LUMPECTOMY WITH RADIOACTIVE SEED LOCALIZATION
Anesthesia: General | Site: Breast | Laterality: Right

## 2017-08-15 MED ORDER — BUPIVACAINE-EPINEPHRINE 0.25% -1:200000 IJ SOLN
INTRAMUSCULAR | Status: DC | PRN
  Administered 2017-08-15: 30 mL

## 2017-08-15 MED ORDER — LACTATED RINGERS IV SOLN
INTRAVENOUS | Status: DC | PRN
  Administered 2017-08-15: 12:00:00 via INTRAVENOUS

## 2017-08-15 MED ORDER — 0.9 % SODIUM CHLORIDE (POUR BTL) OPTIME
TOPICAL | Status: DC | PRN
  Administered 2017-08-15: 1000 mL

## 2017-08-15 MED ORDER — PROPOFOL 10 MG/ML IV BOLUS
INTRAVENOUS | Status: DC | PRN
  Administered 2017-08-15: 160 mg via INTRAVENOUS

## 2017-08-15 MED ORDER — BUPIVACAINE-EPINEPHRINE (PF) 0.25% -1:200000 IJ SOLN
INTRAMUSCULAR | Status: AC
  Filled 2017-08-15: qty 30

## 2017-08-15 MED ORDER — MEPERIDINE HCL 25 MG/ML IJ SOLN: 6.2500 mg | INTRAMUSCULAR | Status: DC | PRN

## 2017-08-15 MED ORDER — ONDANSETRON HCL 4 MG/2ML IJ SOLN
INTRAMUSCULAR | Status: AC
  Filled 2017-08-15: qty 2

## 2017-08-15 MED ORDER — MIDAZOLAM HCL 5 MG/5ML IJ SOLN
INTRAMUSCULAR | Status: DC | PRN
  Administered 2017-08-15: 2 mg via INTRAVENOUS

## 2017-08-15 MED ORDER — DEXAMETHASONE SODIUM PHOSPHATE 10 MG/ML IJ SOLN
INTRAMUSCULAR | Status: DC | PRN
  Administered 2017-08-15: 5 mg via INTRAVENOUS

## 2017-08-15 MED ORDER — FENTANYL CITRATE (PF) 250 MCG/5ML IJ SOLN
INTRAMUSCULAR | Status: DC | PRN
  Administered 2017-08-15: 75 ug via INTRAVENOUS
  Administered 2017-08-15: 25 ug via INTRAVENOUS

## 2017-08-15 MED ORDER — MIDAZOLAM HCL 2 MG/2ML IJ SOLN
INTRAMUSCULAR | Status: AC
  Filled 2017-08-15: qty 2

## 2017-08-15 MED ORDER — LIDOCAINE 2% (20 MG/ML) 5 ML SYRINGE
INTRAMUSCULAR | Status: DC | PRN
  Administered 2017-08-15: 100 mg via INTRAVENOUS

## 2017-08-15 MED ORDER — ONDANSETRON HCL 4 MG/2ML IJ SOLN
INTRAMUSCULAR | Status: DC | PRN
  Administered 2017-08-15: 4 mg via INTRAVENOUS

## 2017-08-15 MED ORDER — LIDOCAINE 2% (20 MG/ML) 5 ML SYRINGE
INTRAMUSCULAR | Status: AC
  Filled 2017-08-15: qty 5

## 2017-08-15 MED ORDER — METOCLOPRAMIDE HCL 5 MG/ML IJ SOLN: 10.0000 mg | Freq: Once | INTRAMUSCULAR | Status: DC | PRN

## 2017-08-15 MED ORDER — CEFAZOLIN SODIUM-DEXTROSE 2-4 GM/100ML-% IV SOLN
2.0000 g | INTRAVENOUS | Status: AC
  Administered 2017-08-15: 2 g via INTRAVENOUS
  Filled 2017-08-15: qty 100

## 2017-08-15 MED ORDER — FENTANYL CITRATE (PF) 250 MCG/5ML IJ SOLN
INTRAMUSCULAR | Status: AC
  Filled 2017-08-15: qty 5

## 2017-08-15 MED ORDER — PROPOFOL 10 MG/ML IV BOLUS
INTRAVENOUS | Status: AC
  Filled 2017-08-15: qty 20

## 2017-08-15 MED ORDER — PHENYLEPHRINE HCL 10 MG/ML IJ SOLN
INTRAMUSCULAR | Status: DC | PRN
  Administered 2017-08-15: 25 ug/min via INTRAVENOUS

## 2017-08-15 MED ORDER — DEXAMETHASONE SODIUM PHOSPHATE 10 MG/ML IJ SOLN
INTRAMUSCULAR | Status: AC
  Filled 2017-08-15: qty 1

## 2017-08-15 MED ORDER — FENTANYL CITRATE (PF) 100 MCG/2ML IJ SOLN: 25.0000 ug | INTRAMUSCULAR | Status: DC | PRN

## 2017-08-15 MED ORDER — GABAPENTIN 300 MG PO CAPS
300.0000 mg | ORAL_CAPSULE | ORAL | Status: AC
  Administered 2017-08-15: 300 mg via ORAL
  Filled 2017-08-15: qty 1

## 2017-08-15 MED ORDER — ROCURONIUM BROMIDE 10 MG/ML (PF) SYRINGE
PREFILLED_SYRINGE | INTRAVENOUS | Status: AC
  Filled 2017-08-15: qty 5

## 2017-08-15 MED ORDER — CHLORHEXIDINE GLUCONATE CLOTH 2 % EX PADS: 6.0000 | MEDICATED_PAD | Freq: Once | CUTANEOUS | Status: DC

## 2017-08-15 MED ORDER — ACETAMINOPHEN 500 MG PO TABS
1000.0000 mg | ORAL_TABLET | ORAL | Status: AC
  Administered 2017-08-15: 1000 mg via ORAL
  Filled 2017-08-15: qty 2

## 2017-08-15 MED ORDER — HYDROCODONE-ACETAMINOPHEN 5-325 MG PO TABS: 1.0000 | ORAL_TABLET | Freq: Four times a day (QID) | ORAL | 0 refills | Status: DC | PRN

## 2017-08-15 MED ORDER — TRAMADOL HCL 50 MG PO TABS: 50.0000 mg | ORAL_TABLET | Freq: Four times a day (QID) | ORAL | 0 refills | Status: DC | PRN

## 2017-08-15 SURGICAL SUPPLY — 49 items
ADH SKN CLS APL DERMABOND .7 (GAUZE/BANDAGES/DRESSINGS) ×2
APPLIER CLIP 9.375 MED OPEN (MISCELLANEOUS)
APR CLP MED 9.3 20 MLT OPN (MISCELLANEOUS)
BLADE SURG 15 STRL LF DISP TIS (BLADE) ×2 IMPLANT
BLADE SURG 15 STRL SS (BLADE) ×8
CANISTER SUCT 3000ML PPV (MISCELLANEOUS) ×4 IMPLANT
CHLORAPREP W/TINT 26ML (MISCELLANEOUS) ×4 IMPLANT
CLIP APPLIE 9.375 MED OPEN (MISCELLANEOUS) IMPLANT
COVER PROBE W GEL 5X96 (DRAPES) ×4 IMPLANT
COVER SURGICAL LIGHT HANDLE (MISCELLANEOUS) ×4 IMPLANT
DECANTER SPIKE VIAL GLASS SM (MISCELLANEOUS) IMPLANT
DERMABOND ADVANCED (GAUZE/BANDAGES/DRESSINGS) ×2
DERMABOND ADVANCED .7 DNX12 (GAUZE/BANDAGES/DRESSINGS) ×2 IMPLANT
DEVICE DUBIN SPECIMEN MAMMOGRA (MISCELLANEOUS) ×4 IMPLANT
DRAPE CHEST BREAST 15X10 FENES (DRAPES) ×4 IMPLANT
DRAPE LAPAROTOMY 100X72 PEDS (DRAPES) ×2 IMPLANT
DRAPE UTILITY XL STRL (DRAPES) ×8 IMPLANT
ELECT CAUTERY BLADE 6.4 (BLADE) ×4 IMPLANT
ELECT COATED BLADE 2.86 ST (ELECTRODE) ×4 IMPLANT
ELECT REM PT RETURN 9FT ADLT (ELECTROSURGICAL) ×4
ELECTRODE REM PT RTRN 9FT ADLT (ELECTROSURGICAL) ×2 IMPLANT
GAUZE SPONGE 4X4 12PLY STRL (GAUZE/BANDAGES/DRESSINGS) IMPLANT
GLOVE BIO SURGEON STRL SZ7.5 (GLOVE) ×8 IMPLANT
GOWN STRL REUS W/ TWL LRG LVL3 (GOWN DISPOSABLE) ×4 IMPLANT
GOWN STRL REUS W/TWL LRG LVL3 (GOWN DISPOSABLE) ×8
KIT BASIN OR (CUSTOM PROCEDURE TRAY) ×4 IMPLANT
KIT MARKER MARGIN INK (KITS) ×4 IMPLANT
KIT ROOM TURNOVER OR (KITS) ×4 IMPLANT
LIGHT WAVEGUIDE WIDE FLAT (MISCELLANEOUS) IMPLANT
NDL HYPO 25GX1X1/2 BEV (NEEDLE) ×2 IMPLANT
NEEDLE HYPO 25GX1X1/2 BEV (NEEDLE) ×4 IMPLANT
NS IRRIG 1000ML POUR BTL (IV SOLUTION) ×4 IMPLANT
PACK SURGICAL SETUP 50X90 (CUSTOM PROCEDURE TRAY) ×4 IMPLANT
PAD ARMBOARD 7.5X6 YLW CONV (MISCELLANEOUS) ×4 IMPLANT
PENCIL BUTTON HOLSTER BLD 10FT (ELECTRODE) ×4 IMPLANT
SPECIMEN JAR SMALL (MISCELLANEOUS) ×4 IMPLANT
SPONGE LAP 18X18 X RAY DECT (DISPOSABLE) ×4 IMPLANT
SUT MNCRL AB 4-0 PS2 18 (SUTURE) ×6 IMPLANT
SUT SILK 2 0 SH (SUTURE) ×2 IMPLANT
SUT VIC AB 3-0 SH 18 (SUTURE) ×4 IMPLANT
SUT VIC AB 3-0 SH 27 (SUTURE) ×4
SUT VIC AB 3-0 SH 27XBRD (SUTURE) ×2 IMPLANT
SYR BULB 3OZ (MISCELLANEOUS) ×4 IMPLANT
SYR CONTROL 10ML LL (SYRINGE) ×4 IMPLANT
TOWEL OR 17X24 6PK STRL BLUE (TOWEL DISPOSABLE) ×4 IMPLANT
TOWEL OR 17X26 10 PK STRL BLUE (TOWEL DISPOSABLE) ×4 IMPLANT
TUBE CONNECTING 12'X1/4 (SUCTIONS) ×1
TUBE CONNECTING 12X1/4 (SUCTIONS) ×3 IMPLANT
YANKAUER SUCT BULB TIP NO VENT (SUCTIONS) ×4 IMPLANT

## 2017-08-15 NOTE — H&P (Signed)
Cynthia Herrera  Location: [facility] Surgery Patient #: [id number] DOB: 05-08-1946 Widowed / Language: Cleophus Molt / Race: White Female   History of Present Illness  The patient is a 71 year old female who presents with a breast mass. We are asked to see the patient in consultation by Dr. Janie Morning to evaluate her for a complex sclerosing lesion of the right breast. The patient is a 71 year old white female who recently went for a routine screening mammogram. At that time she was found to have a new area of abnormal calcification in the lower inner quadrant of the right breast. This measured 1.5 cm. It was biopsied and came back as a complex sclerosing lesion. She does have a history of left breast cancer treated with lumpectomy and chemotherapy and radiation therapy in 2002. She has a new red mass in the skin of the upper left breast that she would also like to have removed. She does have a history of atrial fibrillation and is on a blood that her for this. She is followed by Dr. Golden Hurter. She also reports that at her last surgery she had a significant issue with her airway so the operation should likely be done in the main or and I will obtain an anesthesia consult prior to surgery.   Past Surgical History  Breast Biopsy  Bilateral. Breast Mass; Local Excision  Left. Colon Polyp Removal - Colonoscopy  Gallbladder Surgery - Laparoscopic  Knee Surgery  Bilateral. Spinal Surgery Midback   Diagnostic Studies History  Colonoscopy  within last year Mammogram  within last year Pap Smear  1-5 years ago  Allergies Codeine Phosphate *ANALGESICS - OPIOID*  MethylPREDNISolone *CORTICOSTEROIDS*  Zolpidem Tartrate *HYPNOTICS/SEDATIVES/SLEEP DISORDER AGENTS*   Medication History  Bystolic (2.5MG  Tablet, Oral) Active. Atorvastatin Calcium (40MG  Tablet, Oral) Active. Furosemide (20MG  Tablet, Oral) Active. Klor-Con M20 [facility] Tablet ER, Oral) Active. Losartan  Potassium (25MG  Tablet, Oral) Active. MetFORMIN HCl (500MG  Tablet, Oral) Active. Spironolactone (25MG  Tablet, Oral) Active. Xarelto (20MG  Tablet, Oral) Active. Medications Reconciled  Social History  Alcohol use  Occasional alcohol use. Caffeine use  Carbonated beverages, Coffee, Tea. No drug use  Tobacco use  Former smoker.  Family History  Alcohol Abuse  Brother, Father. Anesthetic complications  Son. Diabetes Mellitus  Mother. Heart Disease  Brother, Father. Heart disease in female family member before age 58  Kidney Disease  Sister. Malignant Neoplasm Of Pancreas  Mother.  Pregnancy / Birth History Age at menarche  51 years. Age of menopause  16-50 Contraceptive History  Oral contraceptives. Gravida  2 Maternal age  60-20 Para  2 Regular periods   Other Problems Atrial Fibrillation  Breast Cancer  Cholelithiasis  Diabetes Mellitus  High blood pressure  Hypercholesterolemia     Review of Systems  General Not Present- Appetite Loss, Chills, Fatigue, Fever, Night Sweats, Weight Gain and Weight Loss. Skin Present- Rash. Not Present- Change in Wart/Mole, Dryness, Hives, Jaundice, New Lesions, Non-Healing Wounds and Ulcer. HEENT Present- Seasonal Allergies. Not Present- Earache, Hearing Loss, Hoarseness, Nose Bleed, Oral Ulcers, Ringing in the Ears, Sinus Pain, Sore Throat, Visual Disturbances, Wears glasses/contact lenses and Yellow Eyes. Respiratory Not Present- Bloody sputum, Chronic Cough, Difficulty Breathing, Snoring and Wheezing. Breast Not Present- Breast Mass, Breast Pain, Nipple Discharge and Skin Changes. Cardiovascular Present- Shortness of Breath. Not Present- Chest Pain, Difficulty Breathing Lying Down, Leg Cramps, Palpitations, Rapid Heart Rate and Swelling of Extremities. Gastrointestinal Not Present- Abdominal Pain, Bloating, Bloody Stool, Change in Bowel Habits, Chronic diarrhea, Constipation,  Difficulty Swallowing, Excessive  gas, Gets full quickly at meals, Hemorrhoids, Indigestion, Nausea, Rectal Pain and Vomiting. Female Genitourinary Present- Urgency. Not Present- Frequency, Nocturia, Painful Urination and Pelvic Pain. Musculoskeletal Present- Back Pain. Not Present- Joint Pain, Joint Stiffness, Muscle Pain, Muscle Weakness and Swelling of Extremities. Neurological Present- Numbness. Not Present- Decreased Memory, Fainting, Headaches, Seizures, Tingling, Tremor, Trouble walking and Weakness. Psychiatric Not Present- Anxiety, Bipolar, Change in Sleep Pattern, Depression, Fearful and Frequent crying. Endocrine Present- Heat Intolerance. Not Present- Cold Intolerance, Excessive Hunger, Hair Changes, Hot flashes and New Diabetes. Hematology Present- Blood Thinners. Not Present- Easy Bruising, Excessive bleeding, Gland problems, HIV and Persistent Infections.  Vitals  Weight: 253.13 lb Temp.: 98.28F(Oral)  Pulse: 77 (Regular)  BP: 120/70 (Sitting, Left Arm, Standard)       Physical Exam General Mental Status-Alert. General Appearance-Consistent with stated age. Hydration-Well hydrated. Voice-Normal.  Head and Neck Head-normocephalic, atraumatic with no lesions or palpable masses. Trachea-midline. Thyroid Gland Characteristics - normal size and consistency.  Eye Eyeball - Bilateral-Extraocular movements intact. Sclera/Conjunctiva - Bilateral-No scleral icterus.  Chest and Lung Exam Chest and lung exam reveals -quiet, even and easy respiratory effort with no use of accessory muscles and on auscultation, normal breath sounds, no adventitious sounds and normal vocal resonance. Inspection Chest Wall - Normal. Back - normal.  Breast Note: There is a 1 cm red mass in the skin of the left upper breast. There is no palpable mass of the right breast. There is no palpable axillary, supraclavicular, or cervical lymphadenopathy.   Cardiovascular Cardiovascular examination reveals  -normal heart sounds, regular rate and rhythm with no murmurs and normal pedal pulses bilaterally.  Abdomen Inspection Inspection of the abdomen reveals - No Hernias. Skin - Scar - no surgical scars. Palpation/Percussion Palpation and Percussion of the abdomen reveal - Soft, Non Tender, No Rebound tenderness, No Rigidity (guarding) and No hepatosplenomegaly. Auscultation Auscultation of the abdomen reveals - Bowel sounds normal.  Neurologic Neurologic evaluation reveals -alert and oriented x 3 with no impairment of recent or remote memory. Mental Status-Normal.  Musculoskeletal Normal Exam - Left-Upper Extremity Strength Normal and Lower Extremity Strength Normal. Normal Exam - Right-Upper Extremity Strength Normal and Lower Extremity Strength Normal.  Lymphatic Head & Neck  General Head & Neck Lymphatics: Bilateral - Description - Normal. Axillary  General Axillary Region: Bilateral - Description - Normal. Tenderness - Non Tender. Femoral & Inguinal  Generalized Femoral & Inguinal Lymphatics: Bilateral - Description - Normal. Tenderness - Non Tender.    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 Because of its size and its abnormal appearance and with her history of previous breast cancer I think it would be reasonable to remove this area. She will need a radioactive seed localization. I have discussed with her in detail the risks and benefits of the operation as well as some of the technical aspects and she understands and wishes to proceed. We will obtain cardiac clearance from Dr. Radford Pax so she can be off her blood thinner. She also has a small mass in the skin in the upper left breast which she would like to have removed and given her history of previous breast cancer on this side I think that is a smart thing today.  We can Do this at the same time as we do her right radioactive seed localized lumpectomy. Given her  history of issues with her airway at her last surgery I will also obtain an anesthesia consult prior to scheduling surgery. Current Plans Pt Education - Breast Diseases: discussed with patient and provided information.

## 2017-08-15 NOTE — Anesthesia Procedure Notes (Signed)
Procedure Name: LMA Insertion Date/Time: 08/15/2017 12:06 PM Performed by: Freddie Breech Pre-anesthesia Checklist: Patient identified, Emergency Drugs available, Suction available and Patient being monitored Patient Re-evaluated:Patient Re-evaluated prior to induction Oxygen Delivery Method: Circle System Utilized Preoxygenation: Pre-oxygenation with 100% oxygen Induction Type: IV induction Ventilation: Mask ventilation without difficulty LMA: LMA inserted LMA Size: 4.0 Number of attempts: 1 Airway Equipment and Method: Bite block Placement Confirmation: positive ETCO2 Tube secured with: Tape Dental Injury: Teeth and Oropharynx as per pre-operative assessment

## 2017-08-15 NOTE — Op Note (Signed)
08/15/2017  1:21 PM  PATIENT:  Cynthia Herrera  71 y.o. female  PRE-OPERATIVE DIAGNOSIS:  RIGHT BREAST COMPLEX SCLEROSING LESION, LEFT BREAST MASS  POST-OPERATIVE DIAGNOSIS:  RIGHT BREAST COMPLEX SCLEROSING LESION, LEFT BREAST MASS  PROCEDURE:  Procedure(s): RIGHT BREAST LUMPECTOMY WITH RADIOACTIVE SEED LOCALIZATION (Right) EXCISION OF LEFT BREAST MASS (Left)  SURGEON:  Surgeon(s) and Role:    Jovita Kussmaul, MD - Primary  PHYSICIAN ASSISTANT:   ASSISTANTS: none   ANESTHESIA:   local and general  EBL:  Minimal  BLOOD ADMINISTERED:none  DRAINS: none   LOCAL MEDICATIONS USED:  MARCAINE     SPECIMEN:  Source of Specimen:  left breast mass, right breast mass with additional medial margin  DISPOSITION OF SPECIMEN:  PATHOLOGY  COUNTS:  YES  TOURNIQUET:  * No tourniquets in log *  DICTATION: .Dragon Dictation   After informed consent was obtained the patient was brought to the operating room and placed in the supine position on the operating table. After adequate induction of general anesthesia the patient's bilateral breast area was prepped with ChloraPrep, allowed to dry, and draped in usual sterile manner. An appropriate timeout was performed. Previously an I-125 seed was placed in the inner aspect of the right breast to mark an area of a complex sclerosing lesion. Attention was first turned to the left breast. In the upper portion of the left breast there was a raised red skin lesion. This area was infiltrated with quarter percent Marcaine. An elliptical incision was made around the palpable mass. The incision was carried through the skin and subcutaneous tissue sharply with a 15 blade knife until the mass was completely removed. The mass was sent to pathology for further evaluation. Hemostasis was achieved using the Bovie electrocautery. The incision was then closed with interrupted 4-0 Monocryl subcuticular stitches. Attention was then turned to the right breast. The  neoprobe was set to I-125 in the area of radioactivity was readily identified in the medial right breast. This area was infiltrated with quarter percent Marcaine. A curvilinear incision was made along the inner aspect of the areola with a 15 blade knife. The incision was carried through the skin and subcutaneous tissue sharply with electrocautery. The dissection was then carried towards the radioactive seed under the direction of the neoprobe. Once I approach the radioactive seed of an removed a circular portion of breast tissue sharply with electrocautery around the radioactive seed will check in the area of radioactivity frequently. Once the specimen was removed it was oriented with the appropriate paint colors. A specimen radiograph showed the seed to be within the specimen. An additional medial margin was removed and marked appropriately. A specimen radiograph of this showed the clip. These 2 specimens were then sent to pathology for further evaluation. Hemostasis was achieved using the Bovie electrocautery. The wound was then irrigated with saline and infiltrated with quarter percent Marcaine. The deep layer of the wound was then closed with layers of interrupted 3-0 Vicryl stitches. The skin was then closed with interrupted 4-0 Monocryl subcuticular stitches. Dermabond dressings were applied. At the end of the case all needle sponge and instrument counts were correct. The patient was then awakened and taken to recovery in stable condition.  PLAN OF CARE: Discharge to home after PACU  PATIENT DISPOSITION:  PACU - hemodynamically stable.   Delay start of Pharmacological VTE agent (>24hrs) due to surgical blood loss or risk of bleeding: not applicable

## 2017-08-15 NOTE — Progress Notes (Signed)
Spoke with Dr Marcell Barlow about where to place IV. States either side. Pt request right side. States she was also told not to have bp or sticks in left arm.

## 2017-08-15 NOTE — Anesthesia Postprocedure Evaluation (Signed)
Anesthesia Post Note  Patient: Cynthia Herrera  Procedure(s) Performed: RIGHT BREAST LUMPECTOMY WITH RADIOACTIVE SEED LOCALIZATION (Right Breast) EXCISION OF LEFT BREAST MASS (Left Breast)     Patient location during evaluation: PACU Anesthesia Type: General Level of consciousness: awake and alert Pain management: pain level controlled Vital Signs Assessment: post-procedure vital signs reviewed and stable Respiratory status: spontaneous breathing, nonlabored ventilation, respiratory function stable and patient connected to nasal cannula oxygen Cardiovascular status: stable and blood pressure returned to baseline Postop Assessment: no apparent nausea or vomiting Anesthetic complications: no    Last Vitals:  Vitals:   08/15/17 1407 08/15/17 1420  BP: (!) 101/55 (!) 100/48  Pulse: 64 62  Resp: 14   Temp:  36.5 C  SpO2: 95% 98%    Last Pain:  Vitals:   08/15/17 1324  TempSrc:   PainSc: 0-No pain                 Montez Hageman

## 2017-08-15 NOTE — Anesthesia Preprocedure Evaluation (Addendum)
Anesthesia Evaluation  Patient identified by MRN, date of birth, ID band Patient awake    Reviewed: Allergy & Precautions, NPO status , Patient's Chart, lab work & pertinent test results  Airway Mallampati: II  TM Distance: >3 FB Neck ROM: Full    Dental no notable dental hx.    Pulmonary COPD, former smoker,    Pulmonary exam normal breath sounds clear to auscultation       Cardiovascular hypertension, Pt. on medications Normal cardiovascular exam+ dysrhythmias Atrial Fibrillation  Rhythm:Regular Rate:Normal     Neuro/Psych negative neurological ROS  negative psych ROS   GI/Hepatic negative GI ROS, Neg liver ROS,   Endo/Other  diabetes  Renal/GU negative Renal ROS  negative genitourinary   Musculoskeletal negative musculoskeletal ROS (+)   Abdominal   Peds negative pediatric ROS (+)  Hematology negative hematology ROS (+)   Anesthesia Other Findings   Reproductive/Obstetrics negative OB ROS                           Anesthesia Physical Anesthesia Plan  ASA: II  Anesthesia Plan: General   Post-op Pain Management:    Induction: Intravenous  PONV Risk Score and Plan: 3 and Ondansetron, Dexamethasone and Treatment may vary due to age or medical condition  Airway Management Planned: LMA  Additional Equipment:   Intra-op Plan:   Post-operative Plan: Extubation in OR  Informed Consent: I have reviewed the patients History and Physical, chart, labs and discussed the procedure including the risks, benefits and alternatives for the proposed anesthesia with the patient or authorized representative who has indicated his/her understanding and acceptance.   Dental advisory given  Plan Discussed with: CRNA  Anesthesia Plan Comments:         Anesthesia Quick Evaluation

## 2017-08-15 NOTE — Interval H&P Note (Signed)
History and Physical Interval Note:  08/15/2017 11:43 AM  Cynthia Herrera  has presented today for surgery, with the diagnosis of RIGHT BREAST COMPLEX SCLEROSING LESION, LEFT BREAST MASS  The various methods of treatment have been discussed with the patient and family. After consideration of risks, benefits and other options for treatment, the patient has consented to  Procedure(s): RIGHT BREAST LUMPECTOMY WITH RADIOACTIVE SEED LOCALIZATION (Right) EXCISION OF LEFT BREAST MASS (Left) as a surgical intervention .  The patient's history has been reviewed, patient examined, no change in status, stable for surgery.  I have reviewed the patient's chart and labs.  Questions were answered to the patient's satisfaction.     TOTH III,[name]

## 2017-08-15 NOTE — Transfer of Care (Signed)
Immediate Anesthesia Transfer of Care Note  Patient: Cynthia Herrera  Procedure(s) Performed: RIGHT BREAST LUMPECTOMY WITH RADIOACTIVE SEED LOCALIZATION (Right Breast) EXCISION OF LEFT BREAST MASS (Left Breast)  Patient Location: PACU  Anesthesia Type:General  Level of Consciousness: drowsy and patient cooperative  Airway & Oxygen Therapy: Patient Spontanous Breathing and Patient connected to face mask oxygen  Post-op Assessment: Report given to RN and Post -op Vital signs reviewed and stable  Post vital signs: Reviewed and stable  Last Vitals:  Vitals:   08/15/17 0952 08/15/17 1324  BP: (!) 139/52 (!) 123/54  Pulse: 71 68  Resp: 20 (!) 23  Temp: 36.4 C 36.5 C  SpO2: 95% 100%    Last Pain:  Vitals:   08/15/17 0952  TempSrc: Oral         Complications: No apparent anesthesia complications

## 2017-08-16 ENCOUNTER — Encounter (HOSPITAL_COMMUNITY): Payer: Self-pay | Admitting: General Surgery

## 2017-08-17 ENCOUNTER — Ambulatory Visit (INDEPENDENT_AMBULATORY_CARE_PROVIDER_SITE_OTHER): Payer: Medicare Other | Admitting: Cardiology

## 2017-08-17 ENCOUNTER — Encounter: Payer: Self-pay | Admitting: Cardiology

## 2017-08-17 ENCOUNTER — Encounter (INDEPENDENT_AMBULATORY_CARE_PROVIDER_SITE_OTHER): Payer: Self-pay

## 2017-08-17 VITALS — BP 122/66 | HR 62 | Ht 66.0 in | Wt 255.4 lb

## 2017-08-17 DIAGNOSIS — I420 Dilated cardiomyopathy: Secondary | ICD-10-CM | POA: Diagnosis not present

## 2017-08-17 DIAGNOSIS — I5042 Chronic combined systolic (congestive) and diastolic (congestive) heart failure: Secondary | ICD-10-CM | POA: Diagnosis not present

## 2017-08-17 DIAGNOSIS — I2510 Atherosclerotic heart disease of native coronary artery without angina pectoris: Secondary | ICD-10-CM | POA: Diagnosis not present

## 2017-08-17 DIAGNOSIS — I10 Essential (primary) hypertension: Secondary | ICD-10-CM

## 2017-08-17 DIAGNOSIS — E785 Hyperlipidemia, unspecified: Secondary | ICD-10-CM

## 2017-08-17 DIAGNOSIS — I481 Persistent atrial fibrillation: Secondary | ICD-10-CM | POA: Diagnosis not present

## 2017-08-17 DIAGNOSIS — I4819 Other persistent atrial fibrillation: Secondary | ICD-10-CM

## 2017-08-17 NOTE — Progress Notes (Signed)
Cardiology Office Note:    Date:  08/17/2017   ID:  Cynthia Herrera, DOB 1946/10/25, MRN [id number]  PCP:  Marletta Lor, MD  Cardiologist:  [name], MD   Referring MD: Marletta Lor, MD   Chief Complaint  Patient presents with  . Coronary Artery Disease  . Hypertension  . Cardiomyopathy  . Hyperlipidemia  . Atrial Fibrillation    History of Present Illness:    Cynthia Herrera is a 71 y.o. female with a hx of  ASCAD (coronary artery calcifications on CT scan and on cath with no obstructive lesions), nonischemic dilated CM EF now 50-55%, HTN, dyslipidemia, chronic anticoagulation, chronic systolic CHF and persistent atrial fibrillation s/p TEE/DCCV to NSR but failed to hold NSR. She was again cardioverted on 11/14/16 after more loading with amio but failed to convert. Later that day she spontaneously converted to NSR. She stopped amio due to severe dizziness. She has been maintaining NSR.  She is here today for followup and is doing well.  Since I last saw her she was found to have calcification in her right breast.  She does not have the bx results back yet.  She denies any chest pain or pressure, SOB, DOE, PND, orthopnea,  dizziness, palpitations or syncope. She occasionally has some LLE edema well controlled on diuretics.  She is compliant with her meds and is tolerating meds with no SE.    Past Medical History:  Diagnosis Date  . Allergy    seasonal  . Arthritis   . Breast cancer ([place]) 2002   left: surgery,chemo,radiation  . Cataracts, bilateral    immature  . Chronic back pain    lumbar stenosis  . Chronic systolic CHF (congestive heart failure) ([facility])    takes Furosemide and Aldactone daily  . Complication of anesthesia    last surgery iv med when going to sleep"burned as injected"  . COPD (chronic obstructive pulmonary disease) ([facility])    no inhalers   . DCM (dilated cardiomyopathy) ([place])    EF 15-20% ? tachycardia induced - EF 50-55% on echo 2015    . Diabetes mellitus without complication ([place])    recent dx  . Dysrhythmia    afib  . Gallstones   . GERD (gastroesophageal reflux disease)    once in a while;depends on what she eats  . History of bronchitis 15+yrs ago  . Hyperlipidemia   . Hypertension    takes Losartan and Metoprolol.   . Joint pain   . Muscle spasm    takes Flexeril daily as needed   . Nocturia   . Osteopenia   . Persistent atrial fibrillation ([place])    s/p TEE DCCV and repeat DCCV 11/14/2013  . Personal history of chemotherapy   . Personal history of colonic polyps    adenomas 03 and 08  . Personal history of radiation therapy   . Pneumonia    hx  . Vitamin D deficiency    takes Vit D    Past Surgical History:  Procedure Laterality Date  . BACK SURGERY  01/2014  . BREAST LUMPECTOMY  2002   left breast  . BREAST LUMPECTOMY WITH RADIOACTIVE SEED LOCALIZATION Right 08/15/2017   Procedure: RIGHT BREAST LUMPECTOMY WITH RADIOACTIVE SEED LOCALIZATION;  Surgeon: Jovita Kussmaul, MD;  Location: [facility];  Service: General;  Laterality: Right;  . CARDIAC CATHETERIZATION  2015  . CARDIOVERSION N/A 11/12/2013   Procedure: CARDIOVERSION;  Surgeon: Thayer Headings, MD;  Location: [facility];  Service: Cardiovascular;  Laterality: N/A;  10:08  Dr. Marissa Nestle, anesthesia present, [place]   60mg ,  propofol 50mg , IV for elective cardioversion....Dr. Cathie Olden delievered synch 120 joules with successful cardioversion to NSR  . CARDIOVERSION N/A 11/12/2013   Procedure: CARDIOVERSION;  Surgeon: Thayer Headings, MD;  Location: [facility];  Service: Cardiovascular;  Laterality: N/A;  . CARDIOVERSION N/A 11/14/2013   Procedure: CARDIOVERSION (BEDSIDE);  Surgeon: Larey Dresser, MD;  Location: [facility];  Service: Cardiovascular;  Laterality: N/A;  . CHOLECYSTECTOMY  2004  . COLONOSCOPY  2003, September 2008, April 01, 2011   adenomas 03 and 08, polyp 12, diverticulosis  . COLONOSCOPY WITH PROPOFOL N/A 11/16/2016   Procedure: COLONOSCOPY  WITH PROPOFOL;  Surgeon: Gatha Mayer, MD;  Location: [facility];  Service: Endoscopy;  Laterality: N/A;  . JOINT REPLACEMENT Bilateral 06 lft, 17 rt   totally replacement surgery knees  . LEFT AND RIGHT HEART CATHETERIZATION WITH CORONARY ANGIOGRAM N/A 02/01/2014   Procedure: LEFT AND RIGHT HEART CATHETERIZATION WITH CORONARY ANGIOGRAM;  Surgeon: Sueanne Margarita, MD;  Location: [facility];  Service: Cardiovascular;  Laterality: N/A;  . LUMBAR LAMINECTOMY/DECOMPRESSION MICRODISCECTOMY N/A 03/19/2014   Procedure: LUMBAR LAMINECTOMY/DECOMPRESSION MICRODISCECTOMY 4 LEVEL;  Surgeon: Kristeen Miss, MD;  Location: [facility];  Service: Neurosurgery;  Laterality: N/A;  L1-2 L2-3 L3-4 L4-5 Laminectomy/Foraminotomy  . MASS EXCISION Left 08/15/2017   Procedure: EXCISION OF LEFT BREAST MASS;  Surgeon: Jovita Kussmaul, MD;  Location: [facility];  Service: General;  Laterality: Left;  Marland Kitchen MASTECTOMY  July 2002   left  . port a cath placed  2002  . PORT-A-CATH REMOVAL  2003  . TEE WITHOUT CARDIOVERSION N/A 11/12/2013   Procedure: TRANSESOPHAGEAL ECHOCARDIOGRAM (TEE);  Surgeon: Thayer Headings, MD;  Location: [facility];  Service: Cardiovascular;  Laterality: N/A;  pt b/p low, pt buccal membranes very dry, lips scapped, pt c/o thirst. NPO since MN and iv FLUIDS TOTAL INFUSING AT TOTAL 20ML HR....  Dr. Cathie Olden order allow NS to bolus during procedure....very dry,NS bolus 250 ml total..pt responding well to meds..  . TOTAL KNEE ARTHROPLASTY  2006  . TOTAL KNEE ARTHROPLASTY Right 05/10/2016   Procedure: RIGHT TOTAL KNEE ARTHROPLASTY;  Surgeon: Gaynelle Arabian, MD;  Location: [facility];  Service: Orthopedics;  Laterality: Right;  With adductor block  . TUBAL LIGATION      Current Medications: Current Meds  Medication Sig  . atorvastatin (LIPITOR) 40 MG tablet TAKE 1 TABLET (40 MG TOTAL) BY MOUTH AT BEDTIME.  Marland Kitchen Cholecalciferol (VITAMIN D3) 2000 units capsule Take 2,000 Units by mouth daily.  . fluticasone (FLONASE) 50  MCG/ACT nasal spray Place 1 spray into both nostrils daily as needed for allergies.  . furosemide (LASIX) 20 MG tablet TAKE 2 TABLETS (40 MG TOTAL) BY MOUTH DAILY.  Marland Kitchen KLOR-CON M20 20 MEQ tablet Take 20 mEq by mouth daily.  Marland Kitchen losartan (COZAAR) 25 MG tablet TAKE 1 TABLET (25 MG TOTAL) BY MOUTH 2 (TWO) TIMES DAILY.  . metFORMIN (GLUCOPHAGE) 500 MG tablet Take 500 mg by mouth 2 (two) times daily.  . nebivolol (BYSTOLIC) 2.5 MG tablet Take 1 tablet (2.5 mg total) by mouth daily.  Marland Kitchen spironolactone (ALDACTONE) 25 MG tablet Take 25 mg by mouth daily.  . traMADol (ULTRAM) 50 MG tablet Take 1-2 tablets (50-100 mg total) by mouth every 6 (six) hours as needed.  Alveda Reasons 20 MG TABS tablet Take 20 mg by mouth daily with supper.     Allergies:  Ambien [zolpidem]; Codeine phosphate; and Medrol [methylprednisolone]   Social History   Social History  . Marital status: Widowed    Spouse name: N/A  . Number of children: 2  . Years of education: N/A   Occupational History  . retired    Social History Main Topics  . Smoking status: Former Smoker    Quit date: 08/24/2001  . Smokeless tobacco: Never Used  . Alcohol use No  . Drug use: No  . Sexual activity: Not Asked   Other Topics Concern  . None   Social History Narrative  . None     Family History: The patient's family history includes Alzheimer's disease in her sister; Dementia in her sister; Diabetes in her brother, father, mother, and sister; Heart disease in her brother and father; Irritable bowel syndrome in her brother; Liver cancer in her mother; Pancreatic cancer in her mother.  ROS:   Please see the history of present illness.    ROS  All other systems reviewed and negative.   EKGs/Labs/Other Studies Reviewed:    The following studies were reviewed today: none  EKG:  EKG is not ordered today.    Recent Labs: 02/03/2017: ALT 17; TSH 0.86 08/09/2017: BUN 15; Creatinine, Ser 1.14; Hemoglobin 12.1; Platelets 308; Potassium  3.6; Sodium 138   Recent Lipid Panel    Component Value Date/Time   CHOL 119 02/03/2017 1207   TRIG 115.0 02/03/2017 1207   HDL 42.60 02/03/2017 1207   CHOLHDL 3 02/03/2017 1207   VLDL 23.0 02/03/2017 1207   LDLCALC 54 02/03/2017 1207    Physical Exam:    VS:  BP 122/66 (BP Location: Right Arm)   Pulse 62   Ht 5\' 6"  (1.676 m)   Wt 255 lb 6.4 oz (115.8 kg)   BMI 41.22 kg/m     Wt Readings from Last 3 Encounters:  08/17/17 255 lb 6.4 oz (115.8 kg)  08/15/17 253 lb (114.8 kg)  08/09/17 253 lb 4.8 oz (114.9 kg)     GEN:  Well nourished, well developed in no acute distress HEENT: Normal NECK: No JVD; No carotid bruits LYMPHATICS: No lymphadenopathy CARDIAC: RRR, no murmurs, rubs, gallops RESPIRATORY:  Clear to auscultation without rales, wheezing or rhonchi  ABDOMEN: Soft, non-tender, non-distended MUSCULOSKELETAL:  No edema; No deformity  SKIN: Warm and dry NEUROLOGIC:  Alert and oriented x 3 PSYCHIATRIC:  Normal affect   ASSESSMENT:    1. Coronary artery disease involving native coronary artery of native heart without angina pectoris   2. Chronic combined systolic and diastolic CHF (congestive heart failure) ([place])   3. DCM (dilated cardiomyopathy) ([place])   4. Persistent atrial fibrillation ([place])   5. Essential hypertension   6. Dyslipidemia    PLAN:    In order of problems listed above:  1.  Nonobstructive CAD by cath - she denies any anginal chest pain or SOB.  She will continue on statin.  No ASA due to DOAC.    2.  Chronic combined systolic/diastolic CHF - she appears euvolemic on exam.  She has no SOB or DOE and weight is stable. She will continue on BB, ARB and diuretic.  Creatinine is stable at 1.14 on 08/15/2017.    3.  DCM - EF now normalized at 50-55%  4.  Persistent atrial fibrillation - she is maintaining NSR on exam. She will continue on Bystolic 2.5mg  daily and Xarelto 20mg  daily.   5.  HTN - her BP is well controlled on exam.  She will continue  on Bystolic 2.5mg  daily, spironolactone 25mg  daily and Losartan 25mg  daily.    6.  Dyslipidemia with LDL goal < 70.  She will continue on atorvastatin 40mg  daily. LDL at goal at 54 on 01/2017.   Medication Adjustments/Labs and Tests Ordered: Current medicines are reviewed at length with the patient today.  Concerns regarding medicines are outlined above.  No orders of the defined types were placed in this encounter.  No orders of the defined types were placed in this encounter.   Signed, [name], MD  08/17/2017 2:52 PM    [place]

## 2017-08-17 NOTE — Patient Instructions (Signed)
Medication Instructions:  Your physician recommends that you continue on your current medications as directed. Please refer to the Current Medication list given to you today.   Labwork: None ordered  Testing/Procedures: None ordered  Follow-Up: Your physician wants you to follow-up in 6 months with Dr. Turner. You will receive a reminder letter in the mail two months in advance. If you don't receive a letter, please call our office to schedule the follow-up appointment.   Any Other Special Instructions Will Be Listed Below (If Applicable).     If you need a refill on your cardiac medications before your next appointment, please call your pharmacy.   

## 2017-08-23 ENCOUNTER — Other Ambulatory Visit: Payer: Self-pay | Admitting: Internal Medicine

## 2017-08-29 ENCOUNTER — Ambulatory Visit: Payer: Medicare Other

## 2017-08-29 ENCOUNTER — Encounter: Payer: Self-pay | Admitting: Radiation Oncology

## 2017-08-31 ENCOUNTER — Encounter: Payer: Self-pay | Admitting: Radiation Oncology

## 2017-08-31 NOTE — Progress Notes (Signed)
Location of Breast Cancer:Recurrence Breast Cancer,  Hx Left 2002, Now Right Breast Lower Inner Quadrant (1.5cm)  Histology per Pathology Report:  07/14/2017:Diagnosis Breast, right, needle core biopsy, lower inner quadrant - COMPLEX SCLEROSING LESION WITH USUAL DUCTAL HYPERPLASIA.- FIBROADENOMATOID CHANGE WITH CALCIFICATIONS. - FIBROCYSTIC CHANGES WITH USUAL DUCTAL HYPERPLASIA.- SEE MICROSCOPIC DESCRIPTION  Receptor Status: ER(100%+), PR (90%+), Her2-neu (neg ratio 1.64), Ki-()  Did patient present with symptoms (if so, please note symptoms) or was this found on screening mammography?: Routine mammogram (sclerosing lesion right breast)   Past/Anticipated interventions by surgeon, if any:PRE-OPERATIVE DIAGNOSIS:  RIGHT BREAST COMPLEX SCLEROSING LESION, LEFT BREAST MASS  POST-OPERATIVE DIAGNOSIS:  RIGHT BREAST COMPLEX SCLEROSING LESION, LEFT BREAST MASS  PROCEDURE:  Procedure(s): RIGHT BREAST LUMPECTOMY WITH RADIOACTIVE SEED LOCALIZATION (Right) EXCISION OF LEFT BREAST MASS (Left)  SURGEON:  Surgeon(s) and Role:    * Toth, Paul III, MD - Primary Diagnosis10/22/218:  1. Breast, lumpectomy, Right w/seed - COMPLEX SCLEROSING LESION WITH USUAL DUCTAL HYPERPLASIA - FIBROCYSTIC CHANGES INCLUDING APOCRINE METAPLASIA - SCLEROTIC FIBROADENOMATOID NODULE WITH CALCIFICATIONS - PREV BIOPSY SITE CHANGES - CALCIFICATIONS 2. Breast, excision, Left - INVASIVE CARCINOMA INVOLVING THE DERMIS  Past/Anticipated interventions by medical oncology, if any: Chemotherapy :  Lymphedema issues, if any: None  Pain issues, if any: Some discomfort  SAFETY ISSUES:  Prior radiation? Yes, 12/4/ 2002-  12/07/2001; `Left breast, Dr. Manning,MD  Pacemaker/ICD? NO  Is the patient on methotrexate? NO  Current Complaints / other details:  Menarche age 12, G2P2,A-Fib, DM, HTN,CASD, CHF, Cardioversion, COPD Father & brother heart disease, Mother  ,pancreatic cancer, sister kidney disease-  Allergies: Codeine  Phosphate analgesics opioid,methylprednisone,corticosteroids;Ambien-Hypnotic/sedative/sleep disorder agents McElroy, Janice Bruner, RN 08/31/2017,11:53 AM  Vitals:   09/01/17 1041  BP: (!) 144/72  Pulse: 66  Resp: 20  Temp: 98.1 F (36.7 C)  SpO2: 99%  Weight: 252 lb 2 oz (114.4 kg)   Wt Readings from Last 3 Encounters:  09/01/17 252 lb 2 oz (114.4 kg)  08/17/17 255 lb 6.4 oz (115.8 kg)  08/15/17 253 lb (114.8 kg)     

## 2017-09-01 ENCOUNTER — Encounter: Payer: Self-pay | Admitting: Radiation Oncology

## 2017-09-01 ENCOUNTER — Ambulatory Visit (HOSPITAL_BASED_OUTPATIENT_CLINIC_OR_DEPARTMENT_OTHER): Payer: Medicare Other | Admitting: Hematology and Oncology

## 2017-09-01 ENCOUNTER — Ambulatory Visit
Admission: RE | Admit: 2017-09-01 | Discharge: 2017-09-01 | Disposition: A | Discharge status: Home / Self Care | Payer: Medicare Other | Source: Ambulatory Visit | Attending: Radiation Oncology | Admitting: Radiation Oncology

## 2017-09-01 ENCOUNTER — Other Ambulatory Visit: Payer: Self-pay

## 2017-09-01 VITALS — BP 144/72 | HR 66 | Temp 98.1°F | Resp 20 | Wt 252.1 lb

## 2017-09-01 DIAGNOSIS — C50311 Malignant neoplasm of lower-inner quadrant of right female breast: Secondary | ICD-10-CM

## 2017-09-01 DIAGNOSIS — Z800 Family history of malignant neoplasm of digestive organs: Secondary | ICD-10-CM

## 2017-09-01 DIAGNOSIS — Z808 Family history of malignant neoplasm of other organs or systems: Secondary | ICD-10-CM

## 2017-09-01 DIAGNOSIS — Z170 Estrogen receptor positive status [ER+]: Secondary | ICD-10-CM | POA: Diagnosis not present

## 2017-09-01 DIAGNOSIS — C50112 Malignant neoplasm of central portion of left female breast: Secondary | ICD-10-CM | POA: Insufficient documentation

## 2017-09-01 DIAGNOSIS — Z853 Personal history of malignant neoplasm of breast: Secondary | ICD-10-CM

## 2017-09-01 NOTE — Progress Notes (Signed)
[name] CONSULT NOTE  Patient Care Team: Marletta Lor, MD as PCP - General  CHIEF COMPLAINTS/PURPOSE OF CONSULTATION:  Newly diagnosed breast cancer  HISTORY OF PRESENTING ILLNESS:  Cynthia Herrera 71 y.o. female is here because of recent diagnosis of left breast cancer.  Patient originally had left breast cancer in 2002 and was treated with lumpectomy adjuvant chemotherapy radiation and had done well until recently.  She had a routine screening mammogram that detected right breast calcifications which on biopsy proven to be a complex sclerosing lesion.  On the left breast she had a skin nodule and request with Dr. [name] to remove it.  The surgery does not be was removed and pathology came back as invasive breast cancer that is ER PR positive HER-2 negative.  She was seen by radiation oncology earlier today and is here today to discuss medical oncology plan.  I reviewed her records extensively and collaborated the history with the patient.  SUMMARY OF ONCOLOGIC HISTORY:   Malignant neoplasm of central portion of left breast in female, estrogen receptor positive ([name])   2002 Initial Diagnosis    Left breast cancer treated with lumpectomy followed by adjuvant chemotherapy (does not remember chemotherapy drugs) and radiation.  Did not take antiestrogen therapy      07/11/2017 Relapse/Recurrence    Left breast skin papule; biopsy proven to be invasive mammary carcinoma ER 100% PR 90% positive HER-2 negative ratio 1.64; Right breast 1.5 cm indeterminate calcifications biopsy proven to be complex sclerosing lesion       MEDICAL HISTORY:  Past Medical History:  Diagnosis Date  . Allergy    seasonal  . Arthritis   . Breast cancer ([name]) 2002   left breast  . Breast cancer ([name]) 07/14/2017   Right breast  . Cataracts, bilateral    immature  . Chronic back pain    lumbar stenosis  . Chronic systolic CHF (congestive heart failure) (H[facility])    takes Furosemide and  Aldactone daily  . Complication of anesthesia    last surgery iv med when going to sleep"burned as injected"  . COPD (chronic obstructive pulmonary disease) ([facility])    no inhalers   . DCM (dilated cardiomyopathy) ([place])    EF 15-20% ? tachycardia induced - EF 50-55% on echo 2015  . Diabetes mellitus without complication ([name])    recent dx  . Dysrhythmia    afib  . Gallstones   . GERD (gastroesophageal reflux disease)    once in a while;depends on what she eats  . History of bronchitis 15+yrs ago  . Hyperlipidemia   . Hypertension    takes Losartan and Metoprolol.   . Joint pain   . Muscle spasm    takes Flexeril daily as needed   . Nocturia   . Osteopenia   . Persistent atrial fibrillation ([name])    s/p TEE DCCV and repeat DCCV 11/14/2013  . Personal history of chemotherapy   . Personal history of colonic polyps    adenomas 03 and 08  . Personal history of radiation therapy   . Pneumonia    hx  . Vitamin D deficiency    takes Vit D    SURGICAL HISTORY: Past Surgical History:  Procedure Laterality Date  . BACK SURGERY  01/2014  . BREAST LUMPECTOMY  2002   left breast  . CARDIAC CATHETERIZATION  2015  . CHOLECYSTECTOMY  2004  . COLONOSCOPY  2003, September 2008, April 01, 2011   adenomas 03  and 08, polyp 12, diverticulosis  . JOINT REPLACEMENT Bilateral 06 lft, 17 rt   totally replacement surgery knees  . MASTECTOMY  July 2002   left  . port a cath placed  2002  . PORT-A-CATH REMOVAL  2003  . TOTAL KNEE ARTHROPLASTY  2006  . TUBAL LIGATION      SOCIAL HISTORY: Social History   Socioeconomic History  . Marital status: Widowed    Spouse name: Not on file  . Number of children: 2  . Years of education: Not on file  . Highest education level: Not on file  Social Needs  . Financial resource strain: Not on file  . Food insecurity - worry: Not on file  . Food insecurity - inability: Not on file  . Transportation needs - medical: Not on file  . Transportation needs  - non-medical: Not on file  Occupational History  . Occupation: retired  Tobacco Use  . Smoking status: Former Smoker    Last attempt to quit: 08/24/2001    Years since quitting: 16.0  . Smokeless tobacco: Never Used  Substance and Sexual Activity  . Alcohol use: No  . Drug use: No  . Sexual activity: Not on file  Other Topics Concern  . Not on file  Social History Narrative  . Not on file    FAMILY HISTORY: Family History  Problem Relation Age of Onset  . Dementia Sister   . Diabetes Sister   . Alzheimer's disease Sister   . Diabetes Brother        x 2  . Heart disease Brother   . Irritable bowel syndrome Brother   . Liver cancer Mother   . Diabetes Mother   . Pancreatic cancer Mother   . Diabetes Father   . Heart disease Father     ALLERGIES:  is allergic to ambien [zolpidem]; codeine phosphate; and medrol [methylprednisolone].  MEDICATIONS:  Current Outpatient Medications  Medication Sig Dispense Refill  . atorvastatin (LIPITOR) 40 MG tablet TAKE 1 TABLET (40 MG TOTAL) BY MOUTH AT BEDTIME. 90 tablet 3  . Cholecalciferol (VITAMIN D3) 2000 units capsule Take 2,000 Units by mouth daily.    . fluticasone (FLONASE) 50 MCG/ACT nasal spray Place 1 spray into both nostrils daily as needed for allergies.    . furosemide (LASIX) 20 MG tablet TAKE 2 TABLETS (40 MG TOTAL) BY MOUTH DAILY. 180 tablet 3  . KLOR-CON M20 20 MEQ tablet Take 20 mEq by mouth daily.  2  . losartan (COZAAR) 25 MG tablet TAKE 1 TABLET (25 MG TOTAL) BY MOUTH 2 (TWO) TIMES DAILY. 180 tablet 2  . metFORMIN (GLUCOPHAGE) 500 MG tablet Take 500 mg by mouth 2 (two) times daily.  1  . metFORMIN (GLUCOPHAGE) 500 MG tablet TAKE 1 TABLET TWICE A DAY WITH A MEAL 180 tablet 1  . nebivolol (BYSTOLIC) 2.5 MG tablet Take 1 tablet (2.5 mg total) by mouth daily. 30 tablet 6  . spironolactone (ALDACTONE) 25 MG tablet Take 25 mg by mouth daily.  3  . traMADol (ULTRAM) 50 MG tablet Take 1-2 tablets (50-100 mg total) by  mouth every 6 (six) hours as needed. 20 tablet 0  . XARELTO 20 MG TABS tablet Take 20 mg by mouth daily with supper.  6   No current facility-administered medications for this visit.     REVIEW OF SYSTEMS:   Constitutional: Denies fevers, chills or abnormal night sweats Eyes: Denies blurriness of vision, double vision or watery eyes Ears, nose,  mouth, throat, and face: Denies mucositis or sore throat Respiratory: Denies cough, dyspnea or wheezes Cardiovascular: Denies palpitation, chest discomfort or lower extremity swelling Gastrointestinal:  Denies nausea, heartburn or change in bowel habits Skin: Denies abnormal skin rashes Lymphatics: Denies new lymphadenopathy or easy bruising Neurological:Denies numbness, tingling or new weaknesses Behavioral/Psych: Mood is stable, no new changes  Breast:  Denies any palpable lumps or discharge All other systems were reviewed with the patient and are negative.  PHYSICAL EXAMINATION: ECOG PERFORMANCE STATUS: 1 - Symptomatic but completely ambulatory  Vitals:   09/01/17 1558  BP: 122/79  Pulse: 72  Resp: 17  Temp: (!) 97.5 F (36.4 C)  SpO2: 100%   Filed Weights   09/01/17 1558  Weight: 254 lb 12.8 oz (115.6 kg)    GENERAL:alert, no distress and comfortable SKIN: skin color, texture, turgor are normal, no rashes or significant lesions EYES: normal, conjunctiva are pink and non-injected, sclera clear OROPHARYNX:no exudate, no erythema and lips, buccal mucosa, and tongue normal  NECK: supple, thyroid normal size, non-tender, without nodularity LYMPH:  no palpable lymphadenopathy in the cervical, axillary or inguinal LUNGS: clear to auscultation and percussion with normal breathing effort HEART: regular rate & rhythm and no murmurs and no lower extremity edema ABDOMEN:abdomen soft, non-tender and normal bowel sounds Musculoskeletal:no cyanosis of digits and no clubbing  PSYCH: alert & oriented x 3 with fluent speech NEURO: no focal  motor/sensory deficits   LABORATORY DATA:  I have reviewed the data as listed Lab Results  Component Value Date   WBC 8.5 08/09/2017   HGB 12.1 08/09/2017   HCT 37.6 08/09/2017   MCV 90.4 08/09/2017   PLT 308 08/09/2017   Lab Results  Component Value Date   NA 138 08/09/2017   K 3.6 08/09/2017   CL 105 08/09/2017   CO2 26 08/09/2017    RADIOGRAPHIC STUDIES: I have personally reviewed the radiological reports and agreed with the findings in the report.  ASSESSMENT AND PLAN:  Malignant neoplasm of central portion of left breast in female, estrogen receptor positive ([place]) 2002-2003: Left breast cancer treated with lumpectomy, chemotherapy, radiation September 2018: Recurrent left breast cancer presenting and skin nodule biopsy proven to have dermal invasion ER 100%, PR 90%, HER-2 negative  Plan: 1.  CT chest abdomen pelvis for staging 2. patient will need a mastectomy given previous lumpectomy and radiation 3.  Oncotype testing to determine if she would benefit from adjuvant chemotherapy 4.  Followed by adjuvant antiestrogen therapy with anastrozole times 5-7 years  Oncotype counseling: I discussed Oncotype DX test. I explained to the patient that this is a 21 gene panel to evaluate patient tumors DNA to calculate recurrence score. This would help determine whether patient has high risk or intermediate risk or low risk breast cancer. She understands that if her tumor was found to be high risk, she would benefit from systemic chemotherapy. If low risk, no need of chemotherapy. If she was found to be intermediate risk, we would need to evaluate the score as well as other risk factors and determine if an abbreviated chemotherapy may be of benefit.  Return to clinic 1 week after surgery to discuss the pathology report.    All questions were answered. The patient knows to call the clinic with any problems, questions or concerns.    Rulon Eisenmenger, MD 09/01/17

## 2017-09-01 NOTE — Assessment & Plan Note (Signed)
2002-2003: Left breast cancer treated with lumpectomy, chemotherapy, radiation September 2018: Recurrent left breast cancer presenting and skin nodule biopsy proven to have dermal invasion ER 100%, PR 90%, HER-2 negative  Plan: 1.  CT chest abdomen pelvis for staging 2. patient will need a mastectomy given previous lumpectomy and radiation 3.  Oncotype testing to determine if she would benefit from adjuvant chemotherapy 4.  Followed by adjuvant antiestrogen therapy with anastrozole times 5-7 years  Oncotype counseling: I discussed Oncotype DX test. I explained to the patient that this is a 21 gene panel to evaluate patient tumors DNA to calculate recurrence score. This would help determine whether patient has high risk or intermediate risk or low risk breast cancer. She understands that if her tumor was found to be high risk, she would benefit from systemic chemotherapy. If low risk, no need of chemotherapy. If she was found to be intermediate risk, we would need to evaluate the score as well as other risk factors and determine if an abbreviated chemotherapy may be of benefit.  Return to clinic 1 week after surgery to discuss the pathology report.

## 2017-09-01 NOTE — Progress Notes (Signed)
Cynthia Herrera is a 71 y.o. who was scheduled to be seen at the request of Dr. [name] for a diagnosis of recurrent left breast cancer. The patient has a history of T2N1 left breast cancer treated in 2002-2003 with lumpectomy, chemotherapy, and radiation which she completed with Dr. Tammi Klippel. She had been followed with mammographic surveillance and was found to have skin lesion in the left breast as well as a distortion finding in the right. Diagnostic imaging revealed a 1.5 cm mass in the right breast.  She underwent a biopsy the right breast on 07/14/17. The right breast mass revealed a complex sclerosing lesion.the time of surgery on 08/15/17, right lumpectomy confirmed a complex sclerosing lesion, and the left excision revealed carcinoma involving the dermis. We discussed with her the role of additional surgical resection rather than re-irradiation plans at this time. Her case will be discussed in conference next week and she will follow up with Dr. [name] and Dr. Lindi Adie.     Carola Rhine, PAC

## 2017-09-07 ENCOUNTER — Encounter (HOSPITAL_COMMUNITY): Payer: Self-pay

## 2017-09-07 ENCOUNTER — Ambulatory Visit (HOSPITAL_COMMUNITY)
Admission: RE | Admit: 2017-09-07 | Discharge: 2017-09-07 | Disposition: A | Discharge status: Home / Self Care | Payer: Medicare Other | Source: Ambulatory Visit | Attending: Hematology and Oncology | Admitting: Hematology and Oncology

## 2017-09-07 ENCOUNTER — Ambulatory Visit (HOSPITAL_COMMUNITY): Payer: Medicare Other

## 2017-09-07 DIAGNOSIS — M5134 Other intervertebral disc degeneration, thoracic region: Secondary | ICD-10-CM | POA: Insufficient documentation

## 2017-09-07 DIAGNOSIS — I2510 Atherosclerotic heart disease of native coronary artery without angina pectoris: Secondary | ICD-10-CM | POA: Diagnosis not present

## 2017-09-07 DIAGNOSIS — M5136 Other intervertebral disc degeneration, lumbar region: Secondary | ICD-10-CM | POA: Diagnosis not present

## 2017-09-07 DIAGNOSIS — C50112 Malignant neoplasm of central portion of left female breast: Secondary | ICD-10-CM | POA: Diagnosis not present

## 2017-09-07 DIAGNOSIS — R911 Solitary pulmonary nodule: Secondary | ICD-10-CM | POA: Diagnosis not present

## 2017-09-07 DIAGNOSIS — I700 Atherosclerosis of aorta: Secondary | ICD-10-CM | POA: Diagnosis not present

## 2017-09-07 DIAGNOSIS — Z170 Estrogen receptor positive status [ER+]: Secondary | ICD-10-CM | POA: Diagnosis not present

## 2017-09-07 DIAGNOSIS — C50912 Malignant neoplasm of unspecified site of left female breast: Secondary | ICD-10-CM | POA: Diagnosis not present

## 2017-09-07 MED ORDER — IOPAMIDOL (ISOVUE-300) INJECTION 61%
INTRAVENOUS | Status: AC
  Administered 2017-09-07: 100 mL via INTRAVENOUS
  Filled 2017-09-07: qty 100

## 2017-09-07 MED ORDER — IOPAMIDOL (ISOVUE-300) INJECTION 61%
100.0000 mL | Freq: Once | INTRAVENOUS | Status: AC | PRN
  Administered 2017-09-07: 100 mL via INTRAVENOUS

## 2017-09-13 ENCOUNTER — Other Ambulatory Visit: Payer: Self-pay

## 2017-09-13 MED ORDER — SPIRONOLACTONE 25 MG PO TABS: 25.0000 mg | ORAL_TABLET | Freq: Every day | ORAL | 3 refills | Status: DC

## 2017-09-14 ENCOUNTER — Other Ambulatory Visit: Payer: Self-pay

## 2017-09-14 MED ORDER — FUROSEMIDE 20 MG PO TABS: 20.0000 mg | ORAL_TABLET | Freq: Two times a day (BID) | ORAL | 3 refills | Status: DC

## 2017-09-19 DIAGNOSIS — Z853 Personal history of malignant neoplasm of breast: Secondary | ICD-10-CM | POA: Diagnosis not present

## 2017-09-19 DIAGNOSIS — Z170 Estrogen receptor positive status [ER+]: Secondary | ICD-10-CM | POA: Diagnosis not present

## 2017-09-19 DIAGNOSIS — C50819 Malignant neoplasm of overlapping sites of unspecified female breast: Secondary | ICD-10-CM | POA: Insufficient documentation

## 2017-09-19 DIAGNOSIS — C50812 Malignant neoplasm of overlapping sites of left female breast: Secondary | ICD-10-CM | POA: Diagnosis not present

## 2017-09-20 ENCOUNTER — Ambulatory Visit: Payer: Self-pay | Admitting: General Surgery

## 2017-09-21 DIAGNOSIS — L308 Other specified dermatitis: Secondary | ICD-10-CM | POA: Diagnosis not present

## 2017-09-21 DIAGNOSIS — C50812 Malignant neoplasm of overlapping sites of left female breast: Secondary | ICD-10-CM | POA: Diagnosis not present

## 2017-09-21 DIAGNOSIS — Z170 Estrogen receptor positive status [ER+]: Secondary | ICD-10-CM | POA: Diagnosis not present

## 2017-09-27 ENCOUNTER — Other Ambulatory Visit: Payer: Self-pay | Admitting: Cardiology

## 2017-09-27 ENCOUNTER — Telehealth: Payer: Self-pay | Admitting: Cardiology

## 2017-09-27 MED ORDER — KLOR-CON M20 20 MEQ PO TBCR: 20.0000 meq | EXTENDED_RELEASE_TABLET | Freq: Every day | ORAL | 3 refills | Status: DC

## 2017-09-27 NOTE — Telephone Encounter (Signed)
Pt has upcoming surgery and has a question to ask, pls call [phone]

## 2017-09-27 NOTE — Telephone Encounter (Signed)
Returned call to patient. Patient wanted to confirm if 2 days off xarelto was enough time. I confirmed with patient, per Jinny Blossom, [facility] ok to hold Xarelto for 2 days prior to surgery. Patient verbalized understanding and thanked me for the call.

## 2017-09-28 NOTE — Pre-Procedure Instructions (Addendum)
Cynthia Herrera  09/28/2017      [id number] - [place], [place] - [address] [place] [address] Phone: [phone] Fax: [phone]    Your procedure is scheduled on December 12  Report to [facility] at 1040 A.M.  Call this number if you have problems the morning of surgery:  [phone]   Remember:  Do not eat food or drink liquids after midnight.  Continue all medications as directed by your physician except follow these medication instructions before surgery below  Please complete your PRE-SURGERY ENSURE that was given to before you leave your house the morning of surgery.  Please, if able, drink it in one setting. DO NOT SIP.    Take these medicines the morning of surgery with A SIP OF WATER  fluticasone (FLONASE)  nebivolol (BYSTOLIC)  traMADol (ULTRAM)  If needed  7 days prior to surgery STOP taking any Aspirin(unless otherwise instructed by your surgeon), Aleve, Naproxen, Ibuprofen, Motrin, Advil, Goody's, BC's, all herbal medications, fish oil, and all vitamins  FOLLOW PHYSICIANS INSTRUCTIONS ABOUT XARELTO   WHAT DO I DO ABOUT MY DIABETES MEDICATION?   Marland Kitchen Do not take oral diabetes medicines (pills) the morning of surgery. metFORMIN (GLUCOPHAGE)    How to Manage Your Diabetes Before and After Surgery  Why is it important to control my blood sugar before and after surgery? . Improving blood sugar levels before and after surgery helps healing and can limit problems. . A way of improving blood sugar control is eating a healthy diet by: o  Eating less sugar and carbohydrates o  Increasing activity/exercise o  Talking with your doctor about reaching your blood sugar goals . High blood sugars (greater than 180 mg/dL) can raise your risk of infections and slow your recovery, so you will need to focus on controlling your diabetes during the weeks before  surgery. . Make sure that the doctor who takes care of your diabetes knows about your planned surgery including the date and location.  How do I manage my blood sugar before surgery? . Check your blood sugar at least 4 times a day, starting 2 days before surgery, to make sure that the level is not too high or low. o Check your blood sugar the morning of your surgery when you wake up and every 2 hours until you get to the Short Stay unit. . If your blood sugar is less than 70 mg/dL, you will need to treat for low blood sugar: o Do not take insulin. o Treat a low blood sugar (less than 70 mg/dL) with  cup of clear juice (cranberry or apple), 4 glucose tablets, OR glucose gel. o Recheck blood sugar in 15 minutes after treatment (to make sure it is greater than 70 mg/dL). If your blood sugar is not greater than 70 mg/dL on recheck, call [phone] for further instructions. . Report your blood sugar to the short stay nurse when you get to Short Stay.  . If you are admitted to the hospital after surgery: o Your blood sugar will be checked by the staff and you will probably be given insulin after surgery (instead of oral diabetes medicines) to make sure you have good blood sugar levels. o The goal for blood sugar control after surgery is 80-180 mg/dL.     Do not wear jewelry, make-up or nail polish.  Do not wear lotions, powders, or perfumes, or deoderant.  Do  not shave 48 hours prior to surgery.    Do not bring valuables to the hospital.  [facility] is not responsible for any belongings or valuables.  Contacts, dentures or bridgework may not be worn into surgery.  Leave your suitcase in the car.  After surgery it may be brought to your room.  For patients admitted to the hospital, discharge time will be determined by your treatment team.  Patients discharged the day of surgery will not be allowed to drive home.    Special instructions:   [facility]- Preparing For Surgery  Before  surgery, you can play an important role. Because skin is not sterile, your skin needs to be as free of germs as possible. You can reduce the number of germs on your skin by washing with CHG (chlorahexidine gluconate) Soap before surgery.  CHG is an antiseptic cleaner which kills germs and bonds with the skin to continue killing germs even after washing.  Please do not use if you have an allergy to CHG or antibacterial soaps. If your skin becomes reddened/irritated stop using the CHG.  Do not shave (including legs and underarms) for at least 48 hours prior to first CHG shower. It is OK to shave your face.  Please follow these instructions carefully.   1. Shower the NIGHT BEFORE SURGERY and the MORNING OF SURGERY with CHG.   2. If you chose to wash your hair, wash your hair first as usual with your normal shampoo.  3. After you shampoo, rinse your hair and body thoroughly to remove the shampoo.  4. Use CHG as you would any other liquid soap. You can apply CHG directly to the skin and wash gently with a scrungie or a clean washcloth.   5. Apply the CHG Soap to your body ONLY FROM THE NECK DOWN.  Do not use on open wounds or open sores. Avoid contact with your eyes, ears, mouth and genitals (private parts). Wash Face and genitals (private parts)  with your normal soap.  6. Wash thoroughly, paying special attention to the area where your surgery will be performed.  7. Thoroughly rinse your body with warm water from the neck down.  8. DO NOT shower/wash with your normal soap after using and rinsing off the CHG Soap.  9. Pat yourself dry with a CLEAN TOWEL.  10. Wear CLEAN PAJAMAS to bed the night before surgery, wear comfortable clothes the morning of surgery  11. Place CLEAN SHEETS on your bed the night of your first shower and DO NOT SLEEP WITH PETS.    Day of Surgery: Do not apply any deodorants/lotions. Please wear clean clothes to the hospital/surgery center.      Please read over  the following fact sheets that you were given.

## 2017-09-29 ENCOUNTER — Encounter (HOSPITAL_COMMUNITY): Payer: Self-pay

## 2017-09-29 ENCOUNTER — Other Ambulatory Visit: Payer: Self-pay

## 2017-09-29 ENCOUNTER — Encounter (HOSPITAL_COMMUNITY)
Admission: RE | Admit: 2017-09-29 | Discharge: 2017-09-29 | Disposition: A | Discharge status: Home / Self Care | Payer: Medicare Other | Source: Ambulatory Visit | Attending: General Surgery | Admitting: General Surgery

## 2017-09-29 DIAGNOSIS — Z01818 Encounter for other preprocedural examination: Secondary | ICD-10-CM | POA: Diagnosis not present

## 2017-09-29 LAB — BASIC METABOLIC PANEL
Anion gap: 10 (ref 5–15)
BUN: 21 mg/dL — AB (ref 6–20)
CHLORIDE: 102 mmol/L (ref 101–111)
CO2: 24 mmol/L (ref 22–32)
CREATININE: 1.35 mg/dL — AB (ref 0.44–1.00)
Calcium: 9.2 mg/dL (ref 8.9–10.3)
GFR calc Af Amer: 45 mL/min — ABNORMAL LOW (ref >60)
GFR, EST NON AFRICAN AMERICAN: 38 mL/min — AB (ref >60)
GLUCOSE: 217 mg/dL — AB (ref 65–99)
POTASSIUM: 3.7 mmol/L (ref 3.5–5.1)
Sodium: 136 mmol/L (ref 135–145)

## 2017-09-29 LAB — CBC
HEMATOCRIT: 39.6 % (ref 36.0–46.0)
HEMOGLOBIN: 12.9 g/dL (ref 12.0–15.0)
MCH: 29.6 pg (ref 26.0–34.0)
MCHC: 32.6 g/dL (ref 30.0–36.0)
MCV: 90.8 fL (ref 78.0–100.0)
Platelets: 319 10*3/uL (ref 150–400)
RBC: 4.36 MIL/uL (ref 3.87–5.11)
RDW: 13.9 % (ref 11.5–15.5)
WBC: 9.1 10*3/uL (ref 4.0–10.5)

## 2017-09-29 LAB — GLUCOSE, CAPILLARY: Glucose-Capillary: 198 mg/dL — ABNORMAL HIGH (ref 65–99)

## 2017-09-29 NOTE — Progress Notes (Signed)
PCP - Bluford Kaufmann Cardiologist - Traci Turner  Chest x-ray - 09/07/17 EKG - 08/15/17 Stress Test - denies ECHO - 2015 Cardiac [id number] -    Fasting Blood Sugar - 140s Checks Blood Sugar __0___ times a day  Blood Thinner Instructions: was instructed to stop 2 days prior   Anesthesia review: YES angela note 07/30/17  Patient denies shortness of breath, fever, cough and chest pain at PAT appointment   Patient verbalized understanding of instructions that were given to them at the PAT appointment. Patient was also instructed that they will need to review over the PAT instructions again at home before surgery.

## 2017-09-30 ENCOUNTER — Encounter (HOSPITAL_COMMUNITY): Payer: Self-pay | Admitting: Emergency Medicine

## 2017-09-30 ENCOUNTER — Encounter (HOSPITAL_COMMUNITY): Payer: Self-pay | Admitting: Vascular Surgery

## 2017-09-30 NOTE — Progress Notes (Signed)
Anesthesia Chart Review:  Pt is a 71 year old female scheduled for L mastectomy, L breast chest wall reconstruction with latissmus muscle reconstruction on 10/05/2017 with Jovita Kussmaul, MD and Audelia Hives, DO.   - PCP is Bluford Kaufmann, MD - Cardiologist is [name], MD, last office visit 08/17/17.   PMH includes:  CAD (widely patent but calcified coronaries by 2015 cath), dilated cardiomyopathy, chronic CHF, atrial fibrillation, HTN, DM, hyperlipidemia, COPD, breast cancer, GERD.  - Former smoker. BMI 41.  - S/p R breast lumpectomy 08/15/17. 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 S/p lumbar laminectomy 03/19/14.   Medications include: Lipitor, Lasix, potassium, losartan, metformin, nebivolol, sprionolactone, xarelto. Pt to hold xarelto 2 days before surgery.   BP (!) 141/97   Pulse 70   Temp 36.6 C (Oral)   Resp 20   Ht 5\' 6"  (1.676 m)   Wt 254 lb (115.2 kg)   SpO2 99%   BMI 41.00 kg/m    Preoperative labs reviewed.   - glucose 217. HbA1c was 6.0 on 08/09/17 - PT/INR will be obtained day of surgery  EKG 08/15/17:  - NSR. Septal infarct, age undetermined. ST & T wave abnormality, consider anterior ischemia.  - Appears stable compared with EKG 08/11/16.   Echo 08/26/14:  - Left ventricle: The cavity size was normal. Wall thickness wasnormal. Systolic function was normal. The estimated ejectionfraction was in the range of 50% to 55%. Wall motion was normal;there were no regional wall motion abnormalities. Dopplerparameters are consistent with abnormal left ventricularrelaxation (grade 1 diastolic dysfunction). - Aortic valve: Valve area (VTI): 2.06 cm^2. Valve area (Vmean):1.67 cm^2.  Cardiac cath 02/01/14: 1. Calcified vessels which are widely patent with no obstructive lesions  2. Mildly LV dysfunction EF 45%  3. Normal right heart pressures  REC: Continue current medications.  If PT acceptable day of surgery, I anticipate pt can proceed with surgery as scheduled.    Willeen Cass, FNP-BC [facility]/Anesthesiology Phone: [phone] 09/30/2017 12:56 PM

## 2017-10-01 ENCOUNTER — Telehealth: Payer: Self-pay | Admitting: Hematology and Oncology

## 2017-10-01 NOTE — Telephone Encounter (Signed)
Called patient regarding appointment °

## 2017-10-04 ENCOUNTER — Ambulatory Visit: Payer: Self-pay | Admitting: *Deleted

## 2017-10-04 NOTE — Telephone Encounter (Signed)
Pt stated that it started last night with a scratchy throat. Then started coughing. Has laryngitis and chest congestion, no fever. Is scheduled for surgery tomorrow.  Advised to do home care, talk with pharmacist regarding what meds to take for congestion. Pt voiced understanding.  Reason for Disposition . Cough  Answer Assessment - Initial Assessment Questions 1. ONSET: "When did the cough begin?"      Last night 2. SEVERITY: "How bad is the cough today?"      severe 3. RESPIRATORY DISTRESS: "Describe your breathing."      Tight, chest congestion 4. FEVER: "Do you have a fever?" If so, ask: "What is your temperature, how was it measured, and when did it start?"     No fever 5. HEMOPTYSIS: "Are you coughing up any blood?" If so ask: "How much?" (flecks, streaks, tablespoons, etc.)     Non productive cough 6. TREATMENT: "What have you done so far to treat the cough?" (e.g., meds, fluids, humidifier)     fluids 7. CARDIAC HISTORY: "Do you have any history of heart disease?" (e.g., heart attack, congestive heart failure)      Afib 8. LUNG HISTORY: "Do you have any history of lung disease?"  (e.g., pulmonary embolus, asthma, emphysema)     no 9. PE RISK FACTORS: "Do you have a history of blood clots?" (or: recent major surgery, recent prolonged travel, bedridden )     no 10. OTHER SYMPTOMS: "Do you have any other symptoms? (e.g., runny nose, wheezing, chest pain)       Fullness in chest and runny nose 11. PREGNANCY: "Is there any chance you are pregnant?" "When was your last menstrual period?"       n/a 12. TRAVEL: "Have you traveled out of the country in the last month?" (e.g., travel history, exposures)       no  Protocols used: COUGH - ACUTE NON-PRODUCTIVE-A-AH

## 2017-10-04 NOTE — Progress Notes (Signed)
Cynthia Herrera from  [facility] called , Dr [name] spoke with patient and he plans to leave patient on the schedule for OR tomorrow, Dr Marla Roe will call patient this afternoon, and they decision to do surgery or reschedule it will be made tomorrow.

## 2017-10-05 ENCOUNTER — Encounter (HOSPITAL_COMMUNITY): Admission: RE | Payer: Self-pay | Source: Ambulatory Visit

## 2017-10-05 ENCOUNTER — Ambulatory Visit (HOSPITAL_COMMUNITY): Admission: RE | Admit: 2017-10-05 | Payer: Medicare Other | Source: Ambulatory Visit | Admitting: General Surgery

## 2017-10-05 SURGERY — MASTECTOMY, SIMPLE
Anesthesia: General | Site: Breast | Laterality: Left

## 2017-10-06 ENCOUNTER — Ambulatory Visit: Payer: Medicare Other | Admitting: Internal Medicine

## 2017-10-06 ENCOUNTER — Encounter: Payer: Self-pay | Admitting: Internal Medicine

## 2017-10-06 VITALS — BP 130/66 | HR 87 | Temp 98.5°F | Ht 66.0 in | Wt 254.0 lb

## 2017-10-06 DIAGNOSIS — B9789 Other viral agents as the cause of diseases classified elsewhere: Secondary | ICD-10-CM | POA: Diagnosis not present

## 2017-10-06 DIAGNOSIS — J069 Acute upper respiratory infection, unspecified: Secondary | ICD-10-CM

## 2017-10-06 NOTE — Progress Notes (Signed)
Subjective:    Patient ID: Cynthia Herrera, female    DOB: 06-Jan-1946, 71 y.o.   MRN: [id number]  HPI  71 year old patient who presents with a 3-day history of sore throat cough head and chest congestion.  She is also noted some mild left ear discomfort.  She has been using Tussend expectorant as well as Allegra.  She has a history of essential hypertension as well as atrial fibrillation. She was scheduled for elective left mastectomy and breast reconstruction surgery yesterday but was canceled due to the acute illness.  She has a codeine sensitivity   Past Medical History:  Diagnosis Date  . Allergy    seasonal  . Arthritis   . Breast cancer ([place]) 2002   left breast  . Breast cancer ([place]) 07/14/2017   Right breast  . Cataracts, bilateral    immature  . Chronic back pain    lumbar stenosis  . Chronic systolic CHF (congestive heart failure) (HCC)    takes Furosemide and Aldactone daily  . Complication of anesthesia    last surgery iv med when going to sleep"burned as injected"  . COPD (chronic obstructive pulmonary disease) (HCC)    no inhalers   . DCM (dilated cardiomyopathy) ([place])    EF 15-20% ? tachycardia induced - EF 50-55% on echo 2015  . Diabetes mellitus without complication ([place])    recent dx  . Dysrhythmia    afib  . Gallstones   . GERD (gastroesophageal reflux disease)    once in a while;depends on what she eats  . History of bronchitis 15+yrs ago  . Hyperlipidemia   . Hypertension    takes Losartan and Metoprolol.   . Joint pain   . Muscle spasm    takes Flexeril daily as needed   . Nocturia   . Osteopenia   . Persistent atrial fibrillation ([place])    s/p TEE DCCV and repeat DCCV 11/14/2013  . Personal history of chemotherapy   . Personal history of colonic polyps    adenomas 03 and 08  . Personal history of radiation therapy   . Pneumonia    hx  . Vitamin D deficiency    takes Vit D     Social History   Socioeconomic History  . Marital  status: Widowed    Spouse name: Not on file  . Number of children: 2  . Years of education: Not on file  . Highest education level: Not on file  Social Needs  . Financial resource strain: Not on file  . Food insecurity - worry: Not on file  . Food insecurity - inability: Not on file  . Transportation needs - medical: Not on file  . Transportation needs - non-medical: Not on file  Occupational History  . Occupation: retired  Tobacco Use  . Smoking status: Former Smoker    Last attempt to quit: 08/24/2001    Years since quitting: 16.1  . Smokeless tobacco: Never Used  Substance and Sexual Activity  . Alcohol use: No  . Drug use: No  . Sexual activity: Not on file  Other Topics Concern  . Not on file  Social History Narrative  . Not on file    Past Surgical History:  Procedure Laterality Date  . BACK SURGERY  01/2014  . BREAST LUMPECTOMY  2002   left breast  . BREAST LUMPECTOMY WITH RADIOACTIVE SEED LOCALIZATION Right 08/15/2017   Procedure: RIGHT BREAST LUMPECTOMY WITH RADIOACTIVE SEED LOCALIZATION;  Surgeon: Jovita Kussmaul, MD;  [facility];  Service: General;  Laterality: Right;  . CARDIAC CATHETERIZATION  2015  . CARDIOVERSION N/A 11/12/2013   Procedure: CARDIOVERSION;  Surgeon: Thayer Headings, MD;  Location: [facility] ENDOSCOPY;  Service: Cardiovascular;  Laterality: N/A;  10:08  Dr. Marissa Nestle, anesthesia present, Lido   60mg ,  propofol 50mg , IV for elective cardioversion....Dr. Cathie Olden delievered synch 120 joules with successful cardioversion to NSR  . CARDIOVERSION N/A 11/12/2013   Procedure: CARDIOVERSION;  Surgeon: Thayer Headings, MD;  Location: [place];  Service: Cardiovascular;  Laterality: N/A;  . CARDIOVERSION N/A 11/14/2013   Procedure: CARDIOVERSION (BEDSIDE);  Surgeon: Larey Dresser, MD;  Location: [place];  Service: Cardiovascular;  Laterality: N/A;  . CHOLECYSTECTOMY  2004  . COLONOSCOPY  2003, September 2008, April 01, 2011   adenomas 03 and 08, polyp 12,  diverticulosis  . COLONOSCOPY WITH PROPOFOL N/A 11/16/2016   Procedure: COLONOSCOPY WITH PROPOFOL;  Surgeon: Gatha Mayer, MD;  [facility]: [facility] ENDOSCOPY;  Service: Endoscopy;  Laterality: N/A;  . JOINT REPLACEMENT Bilateral 06 lft, 17 rt   totally replacement surgery knees  . LEFT AND RIGHT HEART CATHETERIZATION WITH CORONARY ANGIOGRAM N/A 02/01/2014   Procedure: LEFT AND RIGHT HEART CATHETERIZATION WITH CORONARY ANGIOGRAM;  Surgeon: Sueanne Margarita, MD;  Location: [place] CATH LAB;  Service: Cardiovascular;  Laterality: N/A;  . LUMBAR LAMINECTOMY/DECOMPRESSION MICRODISCECTOMY N/A 03/19/2014   Procedure: LUMBAR LAMINECTOMY/DECOMPRESSION MICRODISCECTOMY 4 LEVEL;  Surgeon: Kristeen Miss, MD;  Location: [place] NEURO ORS;  Service: Neurosurgery;  Laterality: N/A;  L1-2 L2-3 L3-4 L4-5 Laminectomy/Foraminotomy  . MASS EXCISION Left 08/15/2017   Procedure: EXCISION OF LEFT BREAST MASS;  Surgeon: Jovita Kussmaul, MD;  Location: [place];  Service: General;  Laterality: Left;  Marland Kitchen MASTECTOMY  July 2002   left  . port a cath placed  2002  . PORT-A-CATH REMOVAL  2003  . TEE WITHOUT CARDIOVERSION N/A 11/12/2013   Procedure: TRANSESOPHAGEAL ECHOCARDIOGRAM (TEE);  Surgeon: Thayer Headings, MD;  Location: [place];  Service: Cardiovascular;  Laterality: N/A;  pt b/p low, pt buccal membranes very dry, lips scapped, pt c/o thirst. NPO since MN and iv FLUIDS TOTAL INFUSING AT TOTAL 20ML HR....  Dr. Cathie Olden order allow NS to bolus during procedure....very dry,NS bolus 250 ml total..pt responding well to meds..  . TOTAL KNEE ARTHROPLASTY  2006  . TOTAL KNEE ARTHROPLASTY Right 05/10/2016   Procedure: RIGHT TOTAL KNEE ARTHROPLASTY;  Surgeon: Gaynelle Arabian, MD;  Location: [facility] ORS;  Service: Orthopedics;  Laterality: Right;  With adductor block  . TUBAL LIGATION      Family History  Problem Relation Age of Onset  . Dementia Sister   . Diabetes Sister   . Alzheimer's disease Sister   . Diabetes Brother        x 2  . Heart disease  Brother   . Irritable bowel syndrome Brother   . Liver cancer Mother   . Diabetes Mother   . Pancreatic cancer Mother   . Diabetes Father   . Heart disease Father     Allergies  Allergen Reactions  . Ambien [Zolpidem] Other (See Comments)    ? Hallucinations, up walking around  . Codeine Phosphate Nausea And Vomiting  . Medrol [Methylprednisolone] Other (See Comments)    Felt really weird w the high dose oral steroid, but tolerates low doses or oral steroid    Current Outpatient Medications on File Prior to Visit  Medication Sig Dispense Refill  . atorvastatin (LIPITOR) 40 MG tablet TAKE 1  TABLET (40 MG TOTAL) BY MOUTH AT BEDTIME. 90 tablet 3  . Cholecalciferol (VITAMIN D3) 2000 units capsule Take 2,000 Units by mouth daily.    . fluticasone (FLONASE) 50 MCG/ACT nasal spray Place 1 spray into both nostrils daily as needed for allergies.    . furosemide (LASIX) 20 MG tablet Take 1 tablet (20 mg total) by mouth 2 (two) times daily. 180 tablet 3  . KLOR-CON M20 20 MEQ tablet Take 1 tablet (20 mEq total) by mouth daily. 90 tablet 3  . losartan (COZAAR) 25 MG tablet TAKE 1 TABLET (25 MG TOTAL) BY MOUTH 2 (TWO) TIMES DAILY. 180 tablet 2  . metFORMIN (GLUCOPHAGE) 500 MG tablet TAKE 1 TABLET TWICE A DAY WITH A MEAL 180 tablet 1  . nebivolol (BYSTOLIC) 2.5 MG tablet Take 1 tablet (2.5 mg total) by mouth daily. 30 tablet 6  . spironolactone (ALDACTONE) 25 MG tablet Take 1 tablet (25 mg total) by mouth daily. 90 tablet 3  . traMADol (ULTRAM) 50 MG tablet Take 1-2 tablets (50-100 mg total) by mouth every 6 (six) hours as needed. 20 tablet 0  . XARELTO 20 MG TABS tablet Take 20 mg by mouth daily with supper.  6   No current facility-administered medications on file prior to visit.     BP 130/66 (BP Location: Left Arm, Patient Position: Sitting, Cuff Size: Normal)   Pulse 87   Temp 98.5 F (36.9 C) (Oral)   Ht 5\' 6"  (1.676 m)   Wt 254 lb (115.2 kg)   SpO2 92%   BMI 41.00 kg/m      Review of Systems  Constitutional: Positive for activity change, appetite change and fatigue.  HENT: Positive for congestion, ear pain, rhinorrhea and sinus pressure. Negative for dental problem, hearing loss, sore throat and tinnitus.   Eyes: Negative for pain, discharge and visual disturbance.  Respiratory: Positive for cough. Negative for shortness of breath.   Cardiovascular: Negative for chest pain, palpitations and leg swelling.  Gastrointestinal: Negative for abdominal distention, abdominal pain, blood in stool, constipation, diarrhea, nausea and vomiting.  Genitourinary: Negative for difficulty urinating, dysuria, flank pain, frequency, hematuria, pelvic pain, urgency, vaginal bleeding, vaginal discharge and vaginal pain.  Musculoskeletal: Negative for arthralgias, gait problem and joint swelling.  Skin: Negative for rash.  Neurological: Positive for weakness. Negative for dizziness, syncope, speech difficulty, numbness and headaches.  Hematological: Negative for adenopathy.  Psychiatric/Behavioral: Negative for agitation, behavioral problems and dysphoric mood. The patient is not nervous/anxious.        Objective:   Physical Exam  Constitutional: She is oriented to person, place, and time. She appears well-developed and well-nourished.  HENT:  Head: Normocephalic.  Right Ear: External ear normal.  Left Ear: External ear normal.  Mouth/Throat: Oropharynx is clear and moist.  Eyes: Conjunctivae and EOM are normal. Pupils are equal, round, and reactive to light.  Neck: Normal range of motion. Neck supple. No thyromegaly present.  Cardiovascular: Normal rate, regular rhythm, normal heart sounds and intact distal pulses.  Pulmonary/Chest: Effort normal and breath sounds normal. No respiratory distress. She has no wheezes. She has no rales.  Abdominal: Soft. Bowel sounds are normal. She exhibits no mass. There is no tenderness.  Musculoskeletal: Normal range of motion.   Lymphadenopathy:    She has no cervical adenopathy.  Neurological: She is alert and oriented to person, place, and time.  Skin: Skin is warm and dry. No rash noted.  Psychiatric: She has a normal mood and affect. Her  behavior is normal.          Assessment & Plan:   Viral URI with cough. We will treat symptomatically Liberal fluid intake encouraged We will treat with Mucinex DM  Essential hypertension stable Left breast cancer.  Follow-up general surgery  Nyoka Cowden

## 2017-10-06 NOTE — Patient Instructions (Addendum)

## 2017-10-12 ENCOUNTER — Other Ambulatory Visit: Payer: Self-pay | Admitting: Pharmacist

## 2017-10-12 ENCOUNTER — Telehealth: Payer: Self-pay | Admitting: Cardiology

## 2017-10-12 NOTE — Patient Outreach (Signed)
Contact patient's Cardiologist office regarding whether patient's Xarelto dose needs to be renally dose adjusted at this time based on most recent renal function lab results in Epic. Leave a message with [place] in the office.  Harlow Asa, PharmD, [place] [facility] [phone]

## 2017-10-12 NOTE — Telephone Encounter (Signed)
Spoke with Elisabeth, RPH Per her note from today. She would like clarification on if Xarelto needs to be adjusted due to current kidney function.   Note in Epic that patient's most recent serum creatinine was 1.35 mg/dL and eGFR was 38 mL/min on 09/29/17. Note that based on this creatinine level for the patient, CrCl according to the actual body weight Cockcroft-Gault equation would be 69 mL/min. However, as patient has a BMI >40, it is important to consider adjusted body weight CrCl, which would be ~48.6 mL/min. Note that Xarelto is renally dose adjusted for patient's taking the medication for atrial fibrillation to a dose of Xarelto 15 mg/day for CrCl of 30-50 mL/min.   PLAN  Will contact patient's Cardiologist office regarding whether patient's Xarelto dose needs to be renally dose adjusted at this time.  Elisabeth Dhalla, PharmD, BCACP Clinical Pharmacist [facility] [phone]  Informed her that I would send to Dr. Turner for Further recommendation.  

## 2017-10-12 NOTE — Patient Outreach (Signed)
Perform Epic chart review, per Fuller Canada, pharmacist in Dr. Theodosia Blender office, Okay to continue patient on Xarelto 20 mg daily based on CrCl calculated based on actual body weight.   Harlow Asa, PharmD, [place] [facility] [phone]

## 2017-10-12 NOTE — Telephone Encounter (Signed)
Actual body weight is used to dose Xarelto even for obese patients. Would recommend keeping pt on Xarelto 20mg  daily.

## 2017-10-12 NOTE — Telephone Encounter (Signed)
Please forward to Fuller Canada PharmD for opinion

## 2017-10-12 NOTE — Patient Outreach (Addendum)
Incoming call from Cynthia Herrera in response to the [facility] Medication Adherence Campaign. Speak with patient. HIPAA identifiers verified and verbal consent received.  Ms. Cynthia Herrera reports that she is taking her atorvastatin once each evening as directed. Reports that she misses a dose maybe once or twice/month. Denies any barriers to medication adherence. Reports that she uses a weekly pillbox to manage her medications. Counsel patient on the importance of medication adherence. Discuss further medication adherence strategies such as the use of a phone alarm.  Ms. Cynthia Herrera reports that she has an upcoming surgery planned in the beginning of January. Patient asks to confirm the amount of time that Xarelto should be held prior to a mastectomy. Reports that she has been told to hold the medication for the two days prior to the surgery. Reports that she has also confirmed this direction with the pharmacist at her Cardiology office. Note that this recommendation is consistent with the holding interval range data for perioperative management of Xarelto.   Note in Epic that patient's most recent serum creatinine was 1.35 mg/dL and eGFR was 38 mL/min on 09/29/17. Note that based on this creatinine level for the patient, CrCl according to the actual body weight Cockcroft-Gault equation would be 69 mL/min. However, as patient has a BMI >40, need to know if cardiologist considers adjusted body weight CrCl, which would be ~48.6 mL/min, for Xarelto dosing. Note that Xarelto is renally dose adjusted for patients taking the medication for atrial fibrillation to a dose of Xarelto 15 mg/day for CrCl of 30-50 mL/min.   PLAN  Will contact patient's Cardiologist office regarding whether patient's Xarelto dose needs to be renally dose adjusted at this time.  Harlow Asa, PharmD, [place] [facility] [phone]

## 2017-10-12 NOTE — Telephone Encounter (Signed)
New Message  Cynthia Herrera c/o medication issue:  1. Name of Medication: Xarelto   2. How are you currently taking this medication (dosage and times per day)? 20mg    3. Are you having a reaction (difficulty breathing--STAT)? no  4. What is your medication issue? Cynthia Herrera states that Cynthia Herrera should be talking 15mg  due to the pts recent renal function. Please call back to discuss

## 2017-10-12 NOTE — Telephone Encounter (Signed)
Left a detailed message that per Jinny Blossom, [facility] okay to continue with current dose of Xarelto 20 mg once a day and to call back with any questions or concerns.

## 2017-10-18 NOTE — Assessment & Plan Note (Deleted)
2002-2003: Left breast cancer treated with lumpectomy, chemotherapy, radiation September 2018: Recurrent left breast cancer presenting and skin nodule biopsy proven to have dermal invasion ER 100%, PR 90%, HER-2 negative  CT CAP: 09/07/17: No Met disease, 3 mm lung nodule non specific.  Plan: 1. Mastectomy 2. Oncotype Dx testing 3. Anastrozole 1 mg daily X 5-7 years

## 2017-10-19 ENCOUNTER — Ambulatory Visit: Payer: Medicare Other | Admitting: Hematology and Oncology

## 2017-10-20 ENCOUNTER — Telehealth: Payer: Self-pay | Admitting: Hematology and Oncology

## 2017-10-20 NOTE — Telephone Encounter (Signed)
Scheduled appt per 12/27 sch msg - spoke with patient regarding appts.  °

## 2017-10-26 NOTE — H&P (Signed)
Cynthia Herrera is an 72 y.o. female.   Chief Complaint: breast cancer HPI: The patient is a 72 y.o. yrs old wf here for pre operative history and physical prior to surgery for left breast cancer.  She has been diagnosed with LEFT breast cancer with skin involvement. She had multiple skin biopsies 09/21/17 which showed sparse dermatitis with no evidence of malignancy at 11, 1, 4 and 6 o'clock breast skin biopsies. She will likely require a left latissimus dorsi flap for chest wall coverage. She recently had an upper respiratory infection and surgery had to be delayed.  The plan is to have a left mastectomy with possible primary closure of the left breast skin vs. Skin graft to coverage vs. Left latissimus dorsi flap for coverage. She may also have a right breast reduction to improve her symmetry if she does not require a latissimus flap for coverage. She is on Xarelto for chronic A-fib and has been instructed to stop this 2 days prior to surgery.    History:  She went for routine screening mammogram and was found to have an abnormal calcification in the lower inner quadrant of the RIGHT breast 1.5 cm.  In 2002 she had a LEFT lumpectomy and chemo/radiation for cancer.  She was concerned about a red area in the upper left breast.  She underwent a right breast lumpectomy and excision of left breast mass on 08/15/17 by Dr. [name].  The path showed a LEFT invasive breast cancer ER/PR positive and Her-2 negative. Path showed a complex sclerosing lesion. She is on anticoagulation for atrial fibrillation.  She had airway issues with one of her previous surgeries.  Her husband died ~ 5 year ago.  Past Medical History:  Diagnosis Date  . Allergy    seasonal  . Arthritis   . Breast cancer ([place]) 2002   left breast  . Breast cancer ([place]) 07/14/2017   Right breast  . Cataracts, bilateral    immature  . Chronic back pain    lumbar stenosis  . Chronic systolic CHF (congestive heart failure) (HCC)    takes  Furosemide and Aldactone daily  . Complication of anesthesia    last surgery iv med when going to sleep"burned as injected"  . COPD (chronic obstructive pulmonary disease) (HCC)    no inhalers   . DCM (dilated cardiomyopathy) ([place])    EF 15-20% ? tachycardia induced - EF 50-55% on echo 2015  . Diabetes mellitus without complication ([place])    recent dx  . Dysrhythmia    afib  . Gallstones   . GERD (gastroesophageal reflux disease)    once in a while;depends on what she eats  . History of bronchitis 15+yrs ago  . Hyperlipidemia   . Hypertension    takes Losartan and Metoprolol.   . Joint pain   . Muscle spasm    takes Flexeril daily as needed   . Nocturia   . Osteopenia   . Persistent atrial fibrillation ([place])    s/p TEE DCCV and repeat DCCV 11/14/2013  . Personal history of chemotherapy   . Personal history of colonic polyps    adenomas 03 and 08  . Personal history of radiation therapy   . Pneumonia    hx  . Vitamin D deficiency    takes Vit D    Past Surgical History:  Procedure Laterality Date  . BACK SURGERY  01/2014  . BREAST LUMPECTOMY  2002   left breast  . BREAST LUMPECTOMY WITH RADIOACTIVE  SEED LOCALIZATION Right 08/15/2017   Procedure: RIGHT BREAST LUMPECTOMY WITH RADIOACTIVE SEED LOCALIZATION;  Surgeon: Jovita Kussmaul, MD;  Location: [place];  Service: General;  Laterality: Right;  . CARDIAC CATHETERIZATION  2015  . CARDIOVERSION N/A 11/12/2013   Procedure: CARDIOVERSION;  Surgeon: Thayer Headings, MD;  Location: [facility] ENDOSCOPY;  Service: Cardiovascular;  Laterality: N/A;  10:08  Dr. Marissa Nestle, anesthesia present, Lido   2m,  propofol 553m IV for elective cardioversion....Dr. NaCathie Oldenelievered synch 120 joules with successful cardioversion to NSR  . CARDIOVERSION N/A 11/12/2013   Procedure: CARDIOVERSION;  Surgeon: PhThayer HeadingsMD;  [facility] [place] [facility];  Laterality: N/A;  . CARDIOVERSION N/A 11/14/2013   Procedure: CARDIOVERSION  (BEDSIDE);  Surgeon: DaLarey DresserMD;  [facility]: [facility];  Laterality: N/A;  . CHOLECYSTECTOMY  2004  . COLONOSCOPY  2003, September 2008, April 01, 2011   adenomas 03 and 08, polyp 12, diverticulosis  . COLONOSCOPY WITH PROPOFOL N/A 11/16/2016   Procedure: COLONOSCOPY WITH PROPOFOL;  Surgeon: CaGatha MayerMD;  [facility];  Service: Endoscopy;  Laterality: N/A;  . JOINT REPLACEMENT Bilateral 06 lft, 17 rt   totally replacement surgery knees  . LEFT AND RIGHT HEART CATHETERIZATION WITH CORONARY ANGIOGRAM N/A 02/01/2014   Procedure: LEFT AND RIGHT HEART CATHETERIZATION WITH CORONARY ANGIOGRAM;  Surgeon: TrSueanne MargaritaMD;  [facility];  Service: Cardiovascular;  Laterality: N/A;  . LUMBAR LAMINECTOMY/DECOMPRESSION MICRODISCECTOMY N/A 03/19/2014   Procedure: LUMBAR LAMINECTOMY/DECOMPRESSION MICRODISCECTOMY 4 LEVEL;  Surgeon: HeKristeen MissMD;  [facility];  Service: Neurosurgery;  Laterality: N/A;  L1-2 L2-3 L3-4 L4-5 Laminectomy/Foraminotomy  . MASS EXCISION Left 08/15/2017   Procedure: EXCISION OF LEFT BREAST MASS;  Surgeon: ToJovita KussmaulMD;  [place] Service: General;  Laterality: Left;  . Marland KitchenASTECTOMY  July 2002   left  . port a cath placed  2002  . PORT-A-CATH REMOVAL  2003  . TEE WITHOUT CARDIOVERSION N/A 11/12/2013   Procedure: TRANSESOPHAGEAL ECHOCARDIOGRAM (TEE);  Surgeon: PhThayer HeadingsMD;  [place] Service: Cardiovascular;  Laterality: N/A;  pt b/p low, pt buccal membranes very dry, lips scapped, pt c/o thirst. NPO since MN and iv FLUIDS TOTAL INFUSING AT TOTAL 20ML HR....  Dr. NaCathie Oldenrder allow NS to bolus during procedure....very dry,NS bolus 250 ml total..pt responding well to meds..  . TOTAL KNEE ARTHROPLASTY  2006  . TOTAL KNEE ARTHROPLASTY Right 05/10/2016   Procedure: RIGHT TOTAL KNEE ARTHROPLASTY;  Surgeon: FrGaynelle ArabianMD;  [facility];  Service: Orthopedics;  Laterality: Right;   With adductor block  . TUBAL LIGATION      Family History  Problem Relation Age of Onset  . Dementia Sister   . Diabetes Sister   . Alzheimer's disease Sister   . Diabetes Brother        x 2  . Heart disease Brother   . Irritable bowel syndrome Brother   . Liver cancer Mother   . Diabetes Mother   . Pancreatic cancer Mother   . Diabetes Father   . Heart disease Father    Social History:  reports that she quit smoking about 16 years ago. she has never used smokeless tobacco. She reports that she does not drink alcohol or use drugs.  Allergies:  Allergies  Allergen Reactions  . Ambien [Zolpidem] Other (See Comments)    ? Hallucinations, up walking around  . Codeine Phosphate Nausea And  Vomiting  . Medrol [Methylprednisolone] Other (See Comments)    Felt really weird w the high dose oral steroid, but tolerates low doses or oral steroid    No medications prior to admission.    No results found for this or any previous visit (from the past 48 hour(s)). No results found.  Review of Systems  Constitutional: Negative.   HENT: Negative.   Eyes: Negative.   Respiratory: Negative.   Cardiovascular: Negative.   Gastrointestinal: Negative.   Genitourinary: Negative.   Musculoskeletal: Negative.   Skin: Negative.   Neurological: Negative.   Psychiatric/Behavioral: Negative.     There were no vitals taken for this visit. Physical Exam  Constitutional: She is oriented to person, place, and time. She appears well-developed and well-nourished.  HENT:  Head: Normocephalic and atraumatic.  Eyes: Conjunctivae and EOM are normal. Pupils are equal, round, and reactive to light.  Cardiovascular: Normal rate.  Respiratory: Effort normal.  GI: Soft.  Neurological: She is alert and oriented to person, place, and time.  Skin: Skin is warm. No erythema.  Psychiatric: She has a normal mood and affect. Her behavior is normal. Judgment and thought content normal.      Assessment/Plan Possible reconstruction of left breast / chest wall with skin graft, primary closure or Latissimus muscle flap for closure after mastectomy.  [place], DO 10/26/2017, 10:35 AM

## 2017-10-26 NOTE — Progress Notes (Signed)
Cynthia Herrera            09/28/2017                          [id number] - [place],  - [address]. AT [address] [place] [address] Phone: [phone] Fax: [phone]              Your procedure is scheduled on January 3            Report to [place] at 0830 A.M.            Call this number if you have problems the morning of surgery:            [phone]             Remember:            Do not eat food or drink liquids after midnight.  Continue all medications as directed by your physician except follow these medication instructions before surgery below  Please complete your PRE-SURGERY ENSURE that was given to before you leave your house the morning of surgery.  Please, if able, drink it in one setting. DO NOT SIP.              Take these medicines the morning of surgery with A SIP OF WATER  fluticasone (FLONASE)  nebivolol (BYSTOLIC)  traMADol (ULTRAM)  If needed  7 days prior to surgery STOP taking any Aspirin(unless otherwise instructed by your surgeon), Aleve, Naproxen, Ibuprofen, Motrin, Advil, Goody's, BC's, all herbal medications, fish oil, and all vitamins  FOLLOW PHYSICIANS INSTRUCTIONS ABOUT XARELTO   WHAT DO I DO ABOUT MY DIABETES MEDICATION?    Do not take oral diabetes medicines (pills) the morning of surgery. metFORMIN (GLUCOPHAGE)    HOW TO MANAGE YOUR DIABETES BEFORE AND AFTER SURGERY  Why is it important to control my blood sugar before and after surgery?  Improving blood sugar levels before and after surgery helps healing and can limit problems.  A way of improving blood sugar control is eating a healthy diet by: ?  Eating less sugar and carbohydrates ?  Increasing activity/exercise ?  Talking with your doctor about reaching your blood sugar goals  High blood sugars (greater than 180 mg/dL) can raise your risk of infections and  slow your recovery, so you will need to focus on controlling your diabetes during the weeks before surgery.  Make sure that the doctor who takes care of your diabetes knows about your planned surgery including the date and location.  How do I manage my blood sugar before surgery?  Check your blood sugar at least 4 times a day, starting 2 days before surgery, to make sure that the level is not too high or low. ? Check your blood sugar the morning of your surgery when you wake up and every 2 hours until you get to the Short Stay unit.  If your blood sugar is less than 70 mg/dL, you will need to treat for low blood sugar: ? Do not take insulin. ? Treat a low blood sugar (less than 70 mg/dL) with  cup of clear juice (cranberry or apple), 4glucose tablets, OR glucose gel. ? Recheck blood sugar in 15 minutes after treatment (to make sure it is greater than 70 mg/dL). If your blood sugar is not greater than 70 mg/dL on recheck, call [phone] for further  instructions.  Report your blood sugar to the short stay nurse when you get to Short Stay.   If you are admitted to the hospital after surgery: ? Your blood sugar will be checked by the staff and you will probably be given insulin after surgery (instead of oral diabetes medicines) to make sure you have good blood sugar levels. ? The goal for blood sugar control after surgery is 80-180 mg/dL.               Do not wear jewelry, make-up or nail polish.            Do not wear lotions, powders, or perfumes, or deoderant.            Do not shave 48 hours prior to surgery.              Do not bring valuables to the hospital.            [facility] is not responsible for any belongings or valuables.  Contacts, dentures or bridgework may not be worn into surgery.  Leave your suitcase in the car.  After surgery it may be brought to your room.  For patients admitted to the hospital, discharge time will be determined by your treatment  team.  Patients discharged the day of surgery will not be allowed to drive home.    Special instructions:   [place]- Preparing For Surgery  Before surgery, you can play an important role. Because skin is not sterile, your skin needs to be as free of germs as possible. You can reduce the number of germs on your skin by washing with CHG (chlorahexidine gluconate) Soap before surgery.  CHG is an antiseptic cleaner which kills germs and bonds with the skin to continue killing germs even after washing.  Please do not use if you have an allergy to CHG or antibacterial soaps. If your skin becomes reddened/irritated stop using the CHG.  Do not shave (including legs and underarms) for at least 48 hours prior to first CHG shower. It is OK to shave your face.  Please follow these instructions carefully.                                                                                                                     1. Shower the NIGHT BEFORE SURGERY and the MORNING OF SURGERY with CHG.   2. If you chose to wash your hair, wash your hair first as usual with your normal shampoo.  3. After you shampoo, rinse your hair and body thoroughly to remove the shampoo.  4. Use CHG as you would any other liquid soap. You can apply CHG directly to the skin and wash gently with a scrungie or a clean washcloth.   5. Apply the CHG Soap to your body ONLY FROM THE NECK DOWN.  Do not use on open wounds or open sores. Avoid contact with your eyes, ears, mouth and genitals (private parts). Wash Face and genitals (private  parts)  with your normal soap.  6. Wash thoroughly, paying special attention to the area where your surgery will be performed.  7. Thoroughly rinse your body with warm water from the neck down.  8. DO NOT shower/wash with your normal soap after using and rinsing off the CHG Soap.  9. Pat yourself dry with a CLEAN TOWEL.  10. Wear CLEAN PAJAMAS to bed the night before surgery,  wear comfortable clothes the morning of surgery  11. Place CLEAN SHEETS on your bed the night of your first shower and DO NOT SLEEP WITH PETS.    Day of Surgery: Do not apply any deodorants/lotions. Please wear clean clothes to the hospital/surgery center.

## 2017-10-27 ENCOUNTER — Encounter (HOSPITAL_COMMUNITY)
Admission: RE | Disposition: A | Discharge status: Home / Self Care | Payer: Self-pay | Source: Ambulatory Visit | Attending: General Surgery

## 2017-10-27 ENCOUNTER — Other Ambulatory Visit: Payer: Self-pay

## 2017-10-27 ENCOUNTER — Ambulatory Visit (HOSPITAL_COMMUNITY)
Admission: RE | Admit: 2017-10-27 | Discharge: 2017-10-28 | Disposition: A | Discharge status: Home / Self Care | Payer: Medicare Other | Source: Ambulatory Visit | Attending: General Surgery | Admitting: General Surgery

## 2017-10-27 ENCOUNTER — Inpatient Hospital Stay (HOSPITAL_COMMUNITY): Payer: Medicare Other | Admitting: Anesthesiology

## 2017-10-27 ENCOUNTER — Encounter (HOSPITAL_COMMUNITY): Payer: Self-pay | Admitting: *Deleted

## 2017-10-27 ENCOUNTER — Ambulatory Visit: Payer: Self-pay | Admitting: Plastic Surgery

## 2017-10-27 DIAGNOSIS — Z79899 Other long term (current) drug therapy: Secondary | ICD-10-CM | POA: Diagnosis not present

## 2017-10-27 DIAGNOSIS — Z923 Personal history of irradiation: Secondary | ICD-10-CM | POA: Insufficient documentation

## 2017-10-27 DIAGNOSIS — Z170 Estrogen receptor positive status [ER+]: Secondary | ICD-10-CM | POA: Diagnosis not present

## 2017-10-27 DIAGNOSIS — Z7984 Long term (current) use of oral hypoglycemic drugs: Secondary | ICD-10-CM | POA: Insufficient documentation

## 2017-10-27 DIAGNOSIS — E119 Type 2 diabetes mellitus without complications: Secondary | ICD-10-CM | POA: Insufficient documentation

## 2017-10-27 DIAGNOSIS — Z9221 Personal history of antineoplastic chemotherapy: Secondary | ICD-10-CM | POA: Diagnosis not present

## 2017-10-27 DIAGNOSIS — C50912 Malignant neoplasm of unspecified site of left female breast: Secondary | ICD-10-CM | POA: Diagnosis not present

## 2017-10-27 DIAGNOSIS — I110 Hypertensive heart disease with heart failure: Secondary | ICD-10-CM | POA: Diagnosis not present

## 2017-10-27 DIAGNOSIS — M1990 Unspecified osteoarthritis, unspecified site: Secondary | ICD-10-CM | POA: Insufficient documentation

## 2017-10-27 DIAGNOSIS — I4891 Unspecified atrial fibrillation: Secondary | ICD-10-CM | POA: Diagnosis not present

## 2017-10-27 DIAGNOSIS — I509 Heart failure, unspecified: Secondary | ICD-10-CM | POA: Insufficient documentation

## 2017-10-27 DIAGNOSIS — D0512 Intraductal carcinoma in situ of left breast: Principal | ICD-10-CM | POA: Insufficient documentation

## 2017-10-27 DIAGNOSIS — K219 Gastro-esophageal reflux disease without esophagitis: Secondary | ICD-10-CM | POA: Diagnosis not present

## 2017-10-27 DIAGNOSIS — I5042 Chronic combined systolic (congestive) and diastolic (congestive) heart failure: Secondary | ICD-10-CM | POA: Diagnosis not present

## 2017-10-27 DIAGNOSIS — Z87891 Personal history of nicotine dependence: Secondary | ICD-10-CM | POA: Insufficient documentation

## 2017-10-27 DIAGNOSIS — I2510 Atherosclerotic heart disease of native coronary artery without angina pectoris: Secondary | ICD-10-CM | POA: Insufficient documentation

## 2017-10-27 DIAGNOSIS — Z7901 Long term (current) use of anticoagulants: Secondary | ICD-10-CM | POA: Insufficient documentation

## 2017-10-27 DIAGNOSIS — J449 Chronic obstructive pulmonary disease, unspecified: Secondary | ICD-10-CM | POA: Insufficient documentation

## 2017-10-27 DIAGNOSIS — G8918 Other acute postprocedural pain: Secondary | ICD-10-CM | POA: Diagnosis not present

## 2017-10-27 DIAGNOSIS — C50812 Malignant neoplasm of overlapping sites of left female breast: Secondary | ICD-10-CM | POA: Diagnosis not present

## 2017-10-27 HISTORY — PX: TOTAL MASTECTOMY: SHX6129

## 2017-10-27 HISTORY — PX: MASTECTOMY: SHX3

## 2017-10-27 HISTORY — DX: Other seasonal allergic rhinitis: J30.2

## 2017-10-27 HISTORY — DX: Malignant neoplasm of unspecified site of left female breast: C50.912

## 2017-10-27 HISTORY — DX: Type 2 diabetes mellitus without complications: E11.9

## 2017-10-27 LAB — PROTIME-INR
INR: 0.98
Prothrombin Time: 12.8 seconds (ref 11.4–15.2)

## 2017-10-27 LAB — BASIC METABOLIC PANEL
Anion gap: 9 (ref 5–15)
BUN: 23 mg/dL — AB (ref 6–20)
CALCIUM: 9.2 mg/dL (ref 8.9–10.3)
CO2: 23 mmol/L (ref 22–32)
Chloride: 104 mmol/L (ref 101–111)
Creatinine, Ser: 1.02 mg/dL — ABNORMAL HIGH (ref 0.44–1.00)
GFR calc Af Amer: >60 mL/min (ref >60)
GFR, EST NON AFRICAN AMERICAN: 54 mL/min — AB (ref >60)
Glucose, Bld: 193 mg/dL — ABNORMAL HIGH (ref 65–99)
POTASSIUM: 3.5 mmol/L (ref 3.5–5.1)
SODIUM: 136 mmol/L (ref 135–145)

## 2017-10-27 LAB — CBC
HCT: 37.9 % (ref 36.0–46.0)
Hemoglobin: 12.2 g/dL (ref 12.0–15.0)
MCH: 29.3 pg (ref 26.0–34.0)
MCHC: 32.2 g/dL (ref 30.0–36.0)
MCV: 90.9 fL (ref 78.0–100.0)
PLATELETS: 346 10*3/uL (ref 150–400)
RBC: 4.17 MIL/uL (ref 3.87–5.11)
RDW: 14.2 % (ref 11.5–15.5)
WBC: 8.7 10*3/uL (ref 4.0–10.5)

## 2017-10-27 LAB — HEMOGLOBIN A1C
HEMOGLOBIN A1C: 6.2 % — AB (ref 4.8–5.6)
Mean Plasma Glucose: 131.24 mg/dL

## 2017-10-27 LAB — GLUCOSE, CAPILLARY
GLUCOSE-CAPILLARY: 143 mg/dL — AB (ref 65–99)
GLUCOSE-CAPILLARY: 249 mg/dL — AB (ref 65–99)
Glucose-Capillary: 201 mg/dL — ABNORMAL HIGH (ref 65–99)
Glucose-Capillary: 149 mg/dL — ABNORMAL HIGH (ref 65–99)

## 2017-10-27 SURGERY — MASTECTOMY, SIMPLE
Anesthesia: Regional | Site: Breast | Laterality: Left

## 2017-10-27 MED ORDER — INSULIN ASPART 100 UNIT/ML ~~LOC~~ SOLN
0.0000 [IU] | Freq: Three times a day (TID) | SUBCUTANEOUS | Status: DC
  Administered 2017-10-28: 3 [IU] via SUBCUTANEOUS

## 2017-10-27 MED ORDER — ONDANSETRON HCL 4 MG/2ML IJ SOLN
INTRAMUSCULAR | Status: AC
  Filled 2017-10-27: qty 2

## 2017-10-27 MED ORDER — CEFAZOLIN SODIUM-DEXTROSE 2-4 GM/100ML-% IV SOLN
2.0000 g | INTRAVENOUS | Status: AC
  Administered 2017-10-27: 2 g via INTRAVENOUS
  Filled 2017-10-27: qty 100

## 2017-10-27 MED ORDER — POLYMYXIN B SULFATE 500000 UNITS IJ SOLR: INTRAMUSCULAR | Status: DC | PRN

## 2017-10-27 MED ORDER — FENTANYL CITRATE (PF) 100 MCG/2ML IJ SOLN
INTRAMUSCULAR | Status: DC | PRN
  Administered 2017-10-27: 250 ug via INTRAVENOUS

## 2017-10-27 MED ORDER — PANTOPRAZOLE SODIUM 40 MG IV SOLR
40.0000 mg | Freq: Every day | INTRAVENOUS | Status: DC
  Administered 2017-10-27: 40 mg via INTRAVENOUS
  Filled 2017-10-27: qty 40

## 2017-10-27 MED ORDER — PHENYLEPHRINE 40 MCG/ML (10ML) SYRINGE FOR IV PUSH (FOR BLOOD PRESSURE SUPPORT)
PREFILLED_SYRINGE | INTRAVENOUS | Status: AC
  Filled 2017-10-27: qty 10

## 2017-10-27 MED ORDER — CHLORHEXIDINE GLUCONATE CLOTH 2 % EX PADS: 6.0000 | MEDICATED_PAD | Freq: Once | CUTANEOUS | Status: DC

## 2017-10-27 MED ORDER — EPHEDRINE 5 MG/ML INJ
INTRAVENOUS | Status: AC
  Filled 2017-10-27: qty 10

## 2017-10-27 MED ORDER — EPHEDRINE SULFATE 50 MG/ML IJ SOLN
INTRAMUSCULAR | Status: DC | PRN
  Administered 2017-10-27: 10 mg via INTRAVENOUS
  Administered 2017-10-27: 5 mg via INTRAVENOUS
  Administered 2017-10-27: 10 mg via INTRAVENOUS

## 2017-10-27 MED ORDER — ONDANSETRON HCL 4 MG/2ML IJ SOLN: 4.0000 mg | Freq: Four times a day (QID) | INTRAMUSCULAR | Status: DC | PRN

## 2017-10-27 MED ORDER — 0.9 % SODIUM CHLORIDE (POUR BTL) OPTIME
TOPICAL | Status: DC | PRN
  Administered 2017-10-27: 1000 mL

## 2017-10-27 MED ORDER — MIDAZOLAM HCL 2 MG/2ML IJ SOLN
INTRAMUSCULAR | Status: AC
  Filled 2017-10-27: qty 2

## 2017-10-27 MED ORDER — PHENYLEPHRINE HCL 10 MG/ML IJ SOLN
INTRAMUSCULAR | Status: DC | PRN
  Administered 2017-10-27: 40 ug via INTRAVENOUS
  Administered 2017-10-27: 80 ug via INTRAVENOUS

## 2017-10-27 MED ORDER — NEBIVOLOL HCL 2.5 MG PO TABS
2.5000 mg | ORAL_TABLET | Freq: Every day | ORAL | Status: DC
  Filled 2017-10-27: qty 1

## 2017-10-27 MED ORDER — DEXAMETHASONE SODIUM PHOSPHATE 10 MG/ML IJ SOLN
INTRAMUSCULAR | Status: AC
  Filled 2017-10-27: qty 1

## 2017-10-27 MED ORDER — KCL IN DEXTROSE-NACL 20-5-0.9 MEQ/L-%-% IV SOLN
INTRAVENOUS | Status: DC
  Administered 2017-10-27: 18:00:00 via INTRAVENOUS
  Filled 2017-10-27: qty 1000

## 2017-10-27 MED ORDER — BUPIVACAINE-EPINEPHRINE (PF) 0.25% -1:200000 IJ SOLN
INTRAMUSCULAR | Status: AC
  Filled 2017-10-27: qty 30

## 2017-10-27 MED ORDER — BUPIVACAINE-EPINEPHRINE 0.25% -1:200000 IJ SOLN: INTRAMUSCULAR | Status: DC | PRN

## 2017-10-27 MED ORDER — SUGAMMADEX SODIUM 200 MG/2ML IV SOLN
INTRAVENOUS | Status: AC
  Filled 2017-10-27: qty 2

## 2017-10-27 MED ORDER — INSULIN ASPART 100 UNIT/ML ~~LOC~~ SOLN
5.0000 [IU] | Freq: Once | SUBCUTANEOUS | Status: AC
  Administered 2017-10-27: 5 [IU] via SUBCUTANEOUS

## 2017-10-27 MED ORDER — LIDOCAINE 2% (20 MG/ML) 5 ML SYRINGE
INTRAMUSCULAR | Status: AC
  Filled 2017-10-27: qty 5

## 2017-10-27 MED ORDER — METHOCARBAMOL 500 MG PO TABS
500.0000 mg | ORAL_TABLET | Freq: Four times a day (QID) | ORAL | Status: DC | PRN
Start: 2017-10-27 — End: 2017-10-28

## 2017-10-27 MED ORDER — PROPOFOL 10 MG/ML IV BOLUS
INTRAVENOUS | Status: DC | PRN
  Administered 2017-10-27: 100 mg via INTRAVENOUS
  Administered 2017-10-27: 40 mg via INTRAVENOUS

## 2017-10-27 MED ORDER — ONDANSETRON HCL 4 MG/2ML IJ SOLN
INTRAMUSCULAR | Status: DC | PRN
  Administered 2017-10-27: 4 mg via INTRAVENOUS

## 2017-10-27 MED ORDER — LACTATED RINGERS IV SOLN
INTRAVENOUS | Status: DC
  Administered 2017-10-27 (×2): via INTRAVENOUS

## 2017-10-27 MED ORDER — ONDANSETRON HCL 4 MG/2ML IJ SOLN
4.0000 mg | Freq: Once | INTRAMUSCULAR | Status: AC
  Administered 2017-10-27: 4 mg via INTRAVENOUS

## 2017-10-27 MED ORDER — SUCCINYLCHOLINE CHLORIDE 200 MG/10ML IV SOSY
PREFILLED_SYRINGE | INTRAVENOUS | Status: AC
  Filled 2017-10-27: qty 10

## 2017-10-27 MED ORDER — ONDANSETRON 4 MG PO TBDP: 4.0000 mg | ORAL_TABLET | Freq: Four times a day (QID) | ORAL | Status: DC | PRN

## 2017-10-27 MED ORDER — FENTANYL CITRATE (PF) 100 MCG/2ML IJ SOLN
INTRAMUSCULAR | Status: AC
  Administered 2017-10-27: 100 ug via INTRAVENOUS
  Filled 2017-10-27: qty 2

## 2017-10-27 MED ORDER — SUCCINYLCHOLINE CHLORIDE 20 MG/ML IJ SOLN
INTRAMUSCULAR | Status: DC | PRN
  Administered 2017-10-27: 120 mg via INTRAVENOUS

## 2017-10-27 MED ORDER — FENTANYL CITRATE (PF) 100 MCG/2ML IJ SOLN
100.0000 ug | Freq: Once | INTRAMUSCULAR | Status: AC
  Administered 2017-10-27: 100 ug via INTRAVENOUS

## 2017-10-27 MED ORDER — PROPOFOL 10 MG/ML IV BOLUS
INTRAVENOUS | Status: AC
  Filled 2017-10-27: qty 20

## 2017-10-27 MED ORDER — ACETAMINOPHEN 500 MG PO TABS
1000.0000 mg | ORAL_TABLET | ORAL | Status: DC
  Filled 2017-10-27: qty 2

## 2017-10-27 MED ORDER — ONDANSETRON HCL 4 MG/2ML IJ SOLN
INTRAMUSCULAR | Status: AC
  Administered 2017-10-27: 4 mg via INTRAVENOUS
  Filled 2017-10-27: qty 2

## 2017-10-27 MED ORDER — HEPARIN SODIUM (PORCINE) 5000 UNIT/ML IJ SOLN: 5000.0000 [IU] | Freq: Three times a day (TID) | INTRAMUSCULAR | Status: DC

## 2017-10-27 MED ORDER — HYDROCODONE-ACETAMINOPHEN 5-325 MG PO TABS: 1.0000 | ORAL_TABLET | ORAL | Status: DC | PRN

## 2017-10-27 MED ORDER — MIDAZOLAM HCL 2 MG/2ML IJ SOLN
2.0000 mg | Freq: Once | INTRAMUSCULAR | Status: AC
  Administered 2017-10-27: 2 mg via INTRAVENOUS

## 2017-10-27 MED ORDER — SPIRONOLACTONE 25 MG PO TABS
25.0000 mg | ORAL_TABLET | Freq: Every day | ORAL | Status: DC
  Administered 2017-10-27: 25 mg via ORAL
  Filled 2017-10-27: qty 1

## 2017-10-27 MED ORDER — ROCURONIUM BROMIDE 100 MG/10ML IV SOLN
INTRAVENOUS | Status: DC | PRN
  Administered 2017-10-27: 50 mg via INTRAVENOUS

## 2017-10-27 MED ORDER — POTASSIUM CHLORIDE CRYS ER 20 MEQ PO TBCR
20.0000 meq | EXTENDED_RELEASE_TABLET | Freq: Every day | ORAL | Status: DC
  Administered 2017-10-27: 20 meq via ORAL
  Filled 2017-10-27: qty 1

## 2017-10-27 MED ORDER — FLUTICASONE PROPIONATE 50 MCG/ACT NA SUSP
1.0000 | Freq: Every evening | NASAL | Status: DC | PRN
  Filled 2017-10-27: qty 16

## 2017-10-27 MED ORDER — LIDOCAINE HCL (CARDIAC) 20 MG/ML IV SOLN
INTRAVENOUS | Status: DC | PRN
  Administered 2017-10-27: 100 mg via INTRAVENOUS

## 2017-10-27 MED ORDER — DEXAMETHASONE SODIUM PHOSPHATE 10 MG/ML IJ SOLN
INTRAMUSCULAR | Status: DC | PRN
  Administered 2017-10-27: 10 mg via INTRAVENOUS

## 2017-10-27 MED ORDER — FUROSEMIDE 20 MG PO TABS
20.0000 mg | ORAL_TABLET | Freq: Two times a day (BID) | ORAL | Status: DC
  Administered 2017-10-27 – 2017-10-28 (×2): 20 mg via ORAL
  Filled 2017-10-27 (×2): qty 1

## 2017-10-27 MED ORDER — DEXTROSE 5 % IV SOLN
3.0000 g | INTRAVENOUS | Status: DC
  Filled 2017-10-27: qty 3000

## 2017-10-27 MED ORDER — METFORMIN HCL 500 MG PO TABS
500.0000 mg | ORAL_TABLET | Freq: Every day | ORAL | Status: DC
  Administered 2017-10-28: 500 mg via ORAL
  Filled 2017-10-27: qty 1

## 2017-10-27 MED ORDER — LOSARTAN POTASSIUM 50 MG PO TABS
25.0000 mg | ORAL_TABLET | Freq: Two times a day (BID) | ORAL | Status: DC
  Administered 2017-10-27: 25 mg via ORAL
  Filled 2017-10-27: qty 1

## 2017-10-27 MED ORDER — RIVAROXABAN 20 MG PO TABS: 20.0000 mg | ORAL_TABLET | Freq: Every day | ORAL | Status: DC

## 2017-10-27 MED ORDER — GABAPENTIN 300 MG PO CAPS
300.0000 mg | ORAL_CAPSULE | ORAL | Status: AC
  Administered 2017-10-27: 300 mg via ORAL
  Filled 2017-10-27: qty 1

## 2017-10-27 MED ORDER — MORPHINE SULFATE (PF) 4 MG/ML IV SOLN: 1.0000 mg | INTRAVENOUS | Status: DC | PRN

## 2017-10-27 MED ORDER — MIDAZOLAM HCL 5 MG/5ML IJ SOLN
INTRAMUSCULAR | Status: DC | PRN
  Administered 2017-10-27: 2 mg via INTRAVENOUS

## 2017-10-27 MED ORDER — CELECOXIB 200 MG PO CAPS
200.0000 mg | ORAL_CAPSULE | ORAL | Status: AC
  Administered 2017-10-27: 200 mg via ORAL
  Filled 2017-10-27: qty 1

## 2017-10-27 MED ORDER — LORATADINE 10 MG PO TABS
10.0000 mg | ORAL_TABLET | Freq: Every day | ORAL | Status: DC
  Administered 2017-10-27: 10 mg via ORAL
  Filled 2017-10-27: qty 1

## 2017-10-27 MED ORDER — MIDAZOLAM HCL 2 MG/2ML IJ SOLN
INTRAMUSCULAR | Status: AC
  Administered 2017-10-27: 2 mg via INTRAVENOUS
  Filled 2017-10-27: qty 2

## 2017-10-27 MED ORDER — SUGAMMADEX SODIUM 200 MG/2ML IV SOLN
INTRAVENOUS | Status: DC | PRN
  Administered 2017-10-27: 250 mg via INTRAVENOUS

## 2017-10-27 MED ORDER — ROCURONIUM BROMIDE 10 MG/ML (PF) SYRINGE
PREFILLED_SYRINGE | INTRAVENOUS | Status: AC
  Filled 2017-10-27: qty 5

## 2017-10-27 MED ORDER — FENTANYL CITRATE (PF) 250 MCG/5ML IJ SOLN
INTRAMUSCULAR | Status: AC
  Filled 2017-10-27: qty 5

## 2017-10-27 SURGICAL SUPPLY — 92 items
ADH SKN CLS APL DERMABOND .7 (GAUZE/BANDAGES/DRESSINGS) ×4
APPLIER CLIP 9.375 MED OPEN (MISCELLANEOUS) ×8
APR CLP MED 9.3 20 MLT OPN (MISCELLANEOUS) ×4
BAG DECANTER FOR FLEXI CONT (MISCELLANEOUS) IMPLANT
BINDER BREAST LRG (GAUZE/BANDAGES/DRESSINGS) IMPLANT
BINDER BREAST XLRG (GAUZE/BANDAGES/DRESSINGS) IMPLANT
BIOPATCH RED 1 DISK 7.0 (GAUZE/BANDAGES/DRESSINGS) ×6 IMPLANT
BIOPATCH RED 1IN DISK 7.0MM (GAUZE/BANDAGES/DRESSINGS) ×1
BLADE SURG 10 STRL SS (BLADE) ×4 IMPLANT
BLADE SURG 15 STRL LF DISP TIS (BLADE) ×2 IMPLANT
BLADE SURG 15 STRL SS (BLADE) ×4
BNDG COHESIVE 4X5 TAN STRL (GAUZE/BANDAGES/DRESSINGS) IMPLANT
CANISTER SUCT 3000ML PPV (MISCELLANEOUS) ×8 IMPLANT
CHLORAPREP W/TINT 26ML (MISCELLANEOUS) ×8 IMPLANT
CLIP APPLIE 9.375 MED OPEN (MISCELLANEOUS) ×4 IMPLANT
CLOSURE WOUND 1/2 X4 (GAUZE/BANDAGES/DRESSINGS)
CONNECTOR 5 IN 1 STRAIGHT STRL (MISCELLANEOUS) ×4 IMPLANT
COVER SURGICAL LIGHT HANDLE (MISCELLANEOUS) ×8 IMPLANT
DECANTER SPIKE VIAL GLASS SM (MISCELLANEOUS) ×4 IMPLANT
DERMABOND ADVANCED (GAUZE/BANDAGES/DRESSINGS) ×4
DERMABOND ADVANCED .7 DNX12 (GAUZE/BANDAGES/DRESSINGS) ×4 IMPLANT
DEVICE DISSECT PLASMABLAD 3.0S (MISCELLANEOUS) ×1 IMPLANT
DRAIN CHANNEL 19F RND (DRAIN) ×6 IMPLANT
DRAPE HALF SHEET 40X57 (DRAPES) ×8 IMPLANT
DRAPE INCISE 23X17 IOBAN STRL (DRAPES)
DRAPE INCISE 23X17 STRL (DRAPES) IMPLANT
DRAPE INCISE IOBAN 23X17 STRL (DRAPES) IMPLANT
DRAPE INCISE IOBAN 85X60 (DRAPES) IMPLANT
DRAPE LAPAROSCOPIC ABDOMINAL (DRAPES) ×4 IMPLANT
DRAPE ORTHO SPLIT 77X108 STRL (DRAPES) ×8
DRAPE SURG ORHT 6 SPLT 77X108 (DRAPES) ×4 IMPLANT
DRAPE UTILITY XL STRL (DRAPES) ×8 IMPLANT
DRAPE WARM FLUID 44X44 (DRAPE) ×4 IMPLANT
DRSG MEPILEX BORDER 4X8 (GAUZE/BANDAGES/DRESSINGS) ×1 IMPLANT
DRSG PAD ABDOMINAL 8X10 ST (GAUZE/BANDAGES/DRESSINGS) ×5 IMPLANT
ELECT BLADE 4.0 EZ CLEAN MEGAD (MISCELLANEOUS) ×4
ELECT BLADE 6.5 EXT (BLADE) ×3 IMPLANT
ELECT CAUTERY BLADE 6.4 (BLADE) ×12 IMPLANT
ELECT COATED BLADE 2.86 ST (ELECTRODE) ×4 IMPLANT
ELECT REM PT RETURN 9FT ADLT (ELECTROSURGICAL) ×8
ELECTRODE BLDE 4.0 EZ CLN MEGD (MISCELLANEOUS) ×1 IMPLANT
ELECTRODE REM PT RTRN 9FT ADLT (ELECTROSURGICAL) ×4 IMPLANT
EVACUATOR SILICONE 100CC (DRAIN) ×6 IMPLANT
GAUZE SPONGE 4X4 12PLY STRL (GAUZE/BANDAGES/DRESSINGS) ×6 IMPLANT
GAUZE XEROFORM 5X9 LF (GAUZE/BANDAGES/DRESSINGS) ×1 IMPLANT
GLOVE BIO SURGEON STRL SZ 6.5 (GLOVE) ×6 IMPLANT
GLOVE BIO SURGEON STRL SZ7.5 (GLOVE) ×7 IMPLANT
GLOVE BIO SURGEONS STRL SZ 6.5 (GLOVE) ×2
GOWN STRL REUS W/ TWL LRG LVL3 (GOWN DISPOSABLE) ×12 IMPLANT
GOWN STRL REUS W/TWL LRG LVL3 (GOWN DISPOSABLE) ×24
KIT BASIN OR (CUSTOM PROCEDURE TRAY) ×8 IMPLANT
KIT MARKER MARGIN INK (KITS) ×3 IMPLANT
KIT ROOM TURNOVER OR (KITS) ×4 IMPLANT
LIGHT WAVEGUIDE WIDE FLAT (MISCELLANEOUS) IMPLANT
NEEDLE 22X1 1/2 (OR ONLY) (NEEDLE) ×4 IMPLANT
NS IRRIG 1000ML POUR BTL (IV SOLUTION) ×12 IMPLANT
PACK GENERAL/GYN (CUSTOM PROCEDURE TRAY) ×8 IMPLANT
PAD ABD 8X10 STRL (GAUZE/BANDAGES/DRESSINGS) ×2 IMPLANT
PAD ARMBOARD 7.5X6 YLW CONV (MISCELLANEOUS) ×16 IMPLANT
PENCIL BUTTON HOLSTER BLD 10FT (ELECTRODE) ×4 IMPLANT
PIN SAFETY STERILE (MISCELLANEOUS) ×3 IMPLANT
PLASMABLADE 3.0S (MISCELLANEOUS) ×4
SPECIMEN JAR X LARGE (MISCELLANEOUS) ×4 IMPLANT
SPONGE LAP 18X18 X RAY DECT (DISPOSABLE) IMPLANT
STAPLER VISISTAT 35W (STAPLE) ×4 IMPLANT
STOCKINETTE IMPERVIOUS 9X36 MD (GAUZE/BANDAGES/DRESSINGS) IMPLANT
STOPCOCK 4 WAY LG BORE MALE ST (IV SETS) IMPLANT
STRIP CLOSURE SKIN 1/2X4 (GAUZE/BANDAGES/DRESSINGS) IMPLANT
SUT ETHILON 2 0 FS 18 (SUTURE) ×2 IMPLANT
SUT ETHILON 3 0 FSL (SUTURE) ×1 IMPLANT
SUT MNCRL AB 3-0 PS2 18 (SUTURE) ×5 IMPLANT
SUT MNCRL AB 4-0 PS2 18 (SUTURE) ×2 IMPLANT
SUT MON AB 4-0 PC3 18 (SUTURE) ×4 IMPLANT
SUT MON AB 5-0 PS2 18 (SUTURE) ×8 IMPLANT
SUT SILK 3 0 SH 30 (SUTURE) ×6 IMPLANT
SUT VIC AB 3-0 54X BRD REEL (SUTURE) IMPLANT
SUT VIC AB 3-0 BRD 54 (SUTURE)
SUT VIC AB 3-0 PS2 18 (SUTURE)
SUT VIC AB 3-0 PS2 18XBRD (SUTURE) ×4 IMPLANT
SUT VIC AB 3-0 SH 18 (SUTURE) ×4 IMPLANT
SUT VIC AB 3-0 SH 8-18 (SUTURE) ×1 IMPLANT
SUT VIC AB 4-0 PS2 18 (SUTURE) ×6 IMPLANT
SYR BULB IRRIGATION 50ML (SYRINGE) ×4 IMPLANT
SYR CONTROL 10ML LL (SYRINGE) ×4 IMPLANT
TOWEL GREEN STERILE (TOWEL DISPOSABLE) ×4 IMPLANT
TOWEL GREEN STERILE FF (TOWEL DISPOSABLE) ×4 IMPLANT
TOWEL OR 17X24 6PK STRL BLUE (TOWEL DISPOSABLE) ×4 IMPLANT
TOWEL OR 17X26 10 PK STRL BLUE (TOWEL DISPOSABLE) ×4 IMPLANT
TRAY FOLEY W/METER SILVER 14FR (SET/KITS/TRAYS/PACK) ×3 IMPLANT
TUBE CONNECTING 12'X1/4 (SUCTIONS) ×1
TUBE CONNECTING 12X1/4 (SUCTIONS) ×3 IMPLANT
YANKAUER SUCT BULB TIP NO VENT (SUCTIONS) ×4 IMPLANT

## 2017-10-27 NOTE — Progress Notes (Signed)
Patient's last dose of Xarelto was evening on 12/31.  Dr. Conrad Hendricks and Dr. [name] aware.

## 2017-10-27 NOTE — Anesthesia Procedure Notes (Signed)
Procedure Name: Intubation Date/Time: 10/27/2017 11:50 AM Performed by: Scheryl Darter, CRNA Pre-anesthesia Checklist: Patient identified, Emergency Drugs available, Suction available and Patient being monitored Patient Re-evaluated:Patient Re-evaluated prior to induction Oxygen Delivery Method: Circle System Utilized Preoxygenation: Pre-oxygenation with 100% oxygen Induction Type: IV induction Ventilation: Mask ventilation without difficulty Laryngoscope Size: Miller and 2 Grade View: Grade II Tube type: Oral Number of attempts: 1 Airway Equipment and Method: Stylet and Oral airway Placement Confirmation: ETT inserted through vocal cords under direct vision,  positive ETCO2 and breath sounds checked- equal and bilateral Secured at: 22 cm Tube secured with: Tape Dental Injury: Teeth and Oropharynx as per pre-operative assessment

## 2017-10-27 NOTE — H&P (Signed)
Cynthia Herrera  Location: [facility] Patient #: [id number] DOB: 1946-08-25 Widowed / Language: Cleophus Molt / Race: White Female   History of Present Illness  The patient is a 72 year old female who presents for a follow-up for Breast cancer. The patient is a 72 year old white female who is about 2 weeks status post excision of a recurrence of her breast cancer in the upper outer portion of the left breast. She has had previous radiation for breast cancer. She returns today to talk about options for treatment. The cancer was ER and PR positive and HER-2 negative.   Allergies Codeine Phosphate *ANALGESICS - OPIOID*  MethylPREDNISolone *CORTICOSTEROIDS*  Zolpidem Tartrate *HYPNOTICS/SEDATIVES/SLEEP DISORDER AGENTS*  Allergies Reconciled   Medication History  Bystolic (2.'5MG'$  Tablet, Oral) Active. Atorvastatin Calcium ('40MG'$  Tablet, Oral) Active. Furosemide ('20MG'$  Tablet, Oral) Active. Klor-Con M20 [facility] Tablet ER, Oral) Active. Losartan Potassium ('25MG'$  Tablet, Oral) Active. MetFORMIN HCl ('500MG'$  Tablet, Oral) Active. Spironolactone ('25MG'$  Tablet, Oral) Active. Xarelto ('20MG'$  Tablet, Oral) Active. Medications Reconciled    Review of Systems General Not Present- Appetite Loss, Chills, Fatigue, Fever, Night Sweats, Weight Gain and Weight Loss. Skin Present- Rash. Not Present- Change in Wart/Mole, Dryness, Hives, Jaundice, New Lesions, Non-Healing Wounds and Ulcer. HEENT Present- Seasonal Allergies. Not Present- Earache, Hearing Loss, Hoarseness, Nose Bleed, Oral Ulcers, Ringing in the Ears, Sinus Pain, Sore Throat, Visual Disturbances, Wears glasses/contact lenses and Yellow Eyes. Respiratory Not Present- Bloody sputum, Chronic Cough, Difficulty Breathing, Snoring and Wheezing. Breast Not Present- Breast Mass, Breast Pain, Nipple Discharge and Skin Changes. Cardiovascular Present- Shortness of Breath. Not Present- Chest Pain, Difficulty Breathing Lying Down, Leg  Cramps, Palpitations, Rapid Heart Rate and Swelling of Extremities. Gastrointestinal Not Present- Abdominal Pain, Bloating, Bloody Stool, Change in Bowel Habits, Chronic diarrhea, Constipation, Difficulty Swallowing, Excessive gas, Gets full quickly at meals, Hemorrhoids, Indigestion, Nausea, Rectal Pain and Vomiting. Female Genitourinary Present- Urgency. Not Present- Frequency, Nocturia, Painful Urination and Pelvic Pain. Musculoskeletal Present- Back Pain. Not Present- Joint Pain, Joint Stiffness, Muscle Pain, Muscle Weakness and Swelling of Extremities. Neurological Present- Numbness. Not Present- Decreased Memory, Fainting, Headaches, Seizures, Tingling, Tremor, Trouble walking and Weakness. Psychiatric Not Present- Anxiety, Bipolar, Change in Sleep Pattern, Depression, Fearful and Frequent crying. Endocrine Present- Heat Intolerance. Not Present- Cold Intolerance, Excessive Hunger, Hair Changes, Hot flashes and New Diabetes. Hematology Present- Blood Thinners. Not Present- Easy Bruising, Excessive bleeding, Gland problems, HIV and Persistent Infections.  Vitals  Weight: 256 lb Height: 66in Body Surface Area: 2.22 m Body Mass Index: 41.32 kg/m  Temp.: 98.73F  Pulse: 84 (Regular)  BP: 142/84 (Sitting, Left Arm, Standard)       Physical Exam  General Mental Status-Alert. General Appearance-Consistent with stated age. Hydration-Well hydrated. Voice-Normal.  Head and Neck Head-normocephalic, atraumatic with no lesions or palpable masses. Trachea-midline. Thyroid Gland Characteristics - normal size and consistency.  Eye Eyeball - Bilateral-Extraocular movements intact. Sclera/Conjunctiva - Bilateral-No scleral icterus.  Chest and Lung Exam Chest and lung exam reveals -quiet, even and easy respiratory effort with no use of accessory muscles and on auscultation, normal breath sounds, no adventitious sounds and normal vocal  resonance. Inspection Chest Wall - Normal. Back - normal.  Breast Note: The incision on the upper outer left breast is healing nicely with no sign of infection or seroma.   Cardiovascular Cardiovascular examination reveals -normal heart sounds, regular rate and rhythm with no murmurs and normal pedal pulses bilaterally.  Abdomen Inspection Inspection of the abdomen reveals - No Hernias. Skin - Scar -  no surgical scars. Palpation/Percussion Palpation and Percussion of the abdomen reveal - Soft, Non Tender, No Rebound tenderness, No Rigidity (guarding) and No hepatosplenomegaly. Auscultation Auscultation of the abdomen reveals - Bowel sounds normal.  Neurologic Neurologic evaluation reveals -alert and oriented x 3 with no impairment of recent or remote memory. Mental Status-Normal.  Musculoskeletal Normal Exam - Left-Upper Extremity Strength Normal and Lower Extremity Strength Normal. Normal Exam - Right-Upper Extremity Strength Normal and Lower Extremity Strength Normal.  Lymphatic Head & Neck  General Head & Neck Lymphatics: Bilateral - Description - Normal. Axillary  General Axillary Region: Bilateral - Description - Normal. Tenderness - Non Tender. Femoral & Inguinal  Generalized Femoral & Inguinal Lymphatics: Bilateral - Description - Normal. Tenderness - Non Tender.     BREAST CANCER, LEFT (C50.912) Impression: The patient appears to have a recurrent cancer in the upper portion of the left breast. Since she has been previously radiated and the recommended treatment plan would be for mastectomy. She would still be a candidate for sentinel node mapping. Given the location of the recurrence and her previous scar there may be difficulty in covering the chest wall once the breast is removed. Because of this I will refer her to plastic surgery in case she requires a flap for coverage of the chest wall. I will then coordinate her surgery with plastic surgery as  quickly as possible. I have discussed with her in detail the risks and benefits of the operation as well as some of the technical aspects and she understands and wishes to proceed. If her Oncotype test is high risk and she may also require a Port-A-Cath. Current Plans Referred to Surgery - Plastic, for evaluation and follow up (Plastic Surgery). Routine.

## 2017-10-27 NOTE — Interval H&P Note (Signed)
History and Physical Interval Note:  10/27/2017 11:22 AM  Cynthia Herrera  has presented today for surgery, with the diagnosis of RECURRENT LEFT BREAST CANCER malignant neoplasm of overlapping sites of left breast  The various methods of treatment have been discussed with the patient and family. After consideration of risks, benefits and other options for treatment, the patient has consented to  Procedure(s): LEFT MASTECTOMY (Left) left breast and chest wall reconstruction with latissimus muscle reconstruction (Left) as a surgical intervention .  The patient's history has been reviewed, patient examined, no change in status, stable for surgery.  I have reviewed the patient's chart and labs.  Questions were answered to the patient's satisfaction.     TOTH III,PAUL S

## 2017-10-27 NOTE — Op Note (Signed)
10/27/2017  1:05 PM  PATIENT:  Cynthia Herrera  72 y.o. female  PRE-OPERATIVE DIAGNOSIS:  RECURRENT LEFT BREAST CANCER   POST-OPERATIVE DIAGNOSIS:  RECURRENT LEFT BREAST CANCER  PROCEDURE:  Procedure(s): LEFT MASTECTOMY WITH REEXCISION LEFT CHEST WALL RECURRENCE  SURGEON:  Surgeon(s) and Role: Panel 1:    * Jovita Kussmaul, MD - Primary Panel 2:    * Dillingham, Loel Lofty, DO - Primary  PHYSICIAN ASSISTANT:   ASSISTANTS: none   ANESTHESIA:   general  EBL:  minimal   BLOOD ADMINISTERED:none  DRAINS: (1) Jackson-Pratt drain(s) with closed bulb suction in the prepectoral space   LOCAL MEDICATIONS USED:  NONE  SPECIMEN:  Source of Specimen:  left mastectomy with stitch at chest wall recurrence site and reexcision left chest wall recurrence  DISPOSITION OF SPECIMEN:  PATHOLOGY  COUNTS:  YES  TOURNIQUET:  * No tourniquets in log *  DICTATION: .Dragon Dictation   After informed consent was obtained the patient was brought to the operating room and placed in the supine position on the operating table.  After adequate induction of general anesthesia the patient's left chest, breast, and axillary area were prepped with ChloraPrep, allowed to dry, and draped in usual sterile manner.  An appropriate timeout was performed.  The patient had a recurrence of breast cancer in the skin of her left upper chest wall.  This was previously excised with a positive microscopic margin.  At this point a elliptical incision was made around the nipple and areola complex.  The incision was carried through the skin and subcutaneous tissue sharply with the plasma blade.  Breast hooks were used to elevate the skin flaps anteriorly towards the ceiling.  Thin skin flaps were then created circumferentially between the breast tissue in the subcutaneous fat.  The this dissection was done with the plasma blade and carried all the way to the chest wall.  Next the breast was removed from the pectoralis muscle with  the pectoralis fascia.  The breast tissue was marked on the superior anterior surface where the scar from the previous excision of the chest wall recurrence was.  The rest of the breast was inked appropriately.  Next the previous scar on the upper chest wall was excised in elliptical fashion widely with a 15 blade knife.  Incision was carried through the skin and subcutaneous tissue sharply with the plasma blade until the entire area was removed down to the chest wall muscle.  This was marked with a long stitch on the medial skin and a short stitch on the superior skin.  This was sent to pathology also for evaluation.  Hemostasis was achieved using the plasma blade.  The wound was irrigated with copious amounts of saline.  A small stab incision was made near the anterior axillary line inferior to the operative area.  22 French round Blake drain was then brought through this opening into the operative site and placed along the chest wall.  The drain was anchored to the skin with a 2-0 silk stitch.  Next the upper chest wall site as well as the mastectomy flaps were grossly reapproximated with interrupted 3-0 Vicryl stitches.  The skin incisions were then closed with running 4-0 Monocryl subcuticular stitches.  Dermabond dressings were applied.  The patient tolerated the procedure well.  At the end of the case all needle sponge and instrument counts were correct.  Sterile dressings and a breast binder were applied.  The drain was placed to bulb suction and  there was a good seal.  The patient was then awakened and taken to recovery in stable condition.  Dr. Marla Roe was the co-surgeon for this case and her evaluation of the skin flaps and help with retraction was instrumental in the case.  PLAN OF CARE: Admit for overnight observation  PATIENT DISPOSITION:  PACU - hemodynamically stable.   Delay start of Pharmacological VTE agent (>24hrs) due to surgical blood loss or risk of bleeding: no

## 2017-10-27 NOTE — Interval H&P Note (Signed)
History and Physical Interval Note:  10/27/2017 10:26 AM  Cynthia Herrera  has presented today for surgery, with the diagnosis of RECURRENT LEFT BREAST CANCER malignant neoplasm of overlapping sites of left breast  The various methods of treatment have been discussed with the patient and family. After consideration of risks, benefits and other options for treatment, the patient has consented to  Procedure(s): LEFT MASTECTOMY (Left) left breast and chest wall reconstruction with latissimus muscle reconstruction (Left) as a surgical intervention .  The patient's history has been reviewed, patient examined, no change in status, stable for surgery.  I have reviewed the patient's chart and labs.  Questions were answered to the patient's satisfaction.     Loel Lofty Henri Baumler

## 2017-10-27 NOTE — Anesthesia Postprocedure Evaluation (Signed)
Anesthesia Post Note  Patient: Cynthia Herrera  Procedure(s) Performed: LEFT MASTECTOMY (Left Breast)     Patient location during evaluation: PACU Anesthesia Type: Regional Level of consciousness: awake and alert Pain management: pain level controlled Vital Signs Assessment: post-procedure vital signs reviewed and stable Respiratory status: spontaneous breathing, nonlabored ventilation, respiratory function stable and patient connected to nasal cannula oxygen Cardiovascular status: blood pressure returned to baseline and stable Postop Assessment: no apparent nausea or vomiting Anesthetic complications: no    Last Vitals:  Vitals:   10/27/17 1615 10/27/17 1643  BP:  129/63  Pulse: 69 70  Resp: 20   Temp:  (!) 36.3 C  SpO2: 100% 96%    Last Pain:  Vitals:   10/27/17 1643  TempSrc: Oral  PainSc:                  Atzin Buchta DAVID

## 2017-10-27 NOTE — Anesthesia Preprocedure Evaluation (Signed)
Anesthesia Evaluation  Patient identified by MRN, date of birth, ID band Patient awake    Reviewed: Allergy & Precautions, NPO status , Patient's Chart, lab work & pertinent test results  Airway Mallampati: I  TM Distance: >3 FB Neck ROM: Full    Dental   Pulmonary COPD, former smoker,    Pulmonary exam normal        Cardiovascular hypertension, Pt. on medications + CAD  Normal cardiovascular exam+ dysrhythmias Atrial Fibrillation      Neuro/Psych    GI/Hepatic GERD  Medicated and Controlled,  Endo/Other  diabetes, Type 2, Oral Hypoglycemic Agents  Renal/GU      Musculoskeletal   Abdominal   Peds  Hematology   Anesthesia Other Findings   Reproductive/Obstetrics                             Anesthesia Physical Anesthesia Plan  ASA: III  Anesthesia Plan: General   Post-op Pain Management:  Regional for Post-op pain   Induction:   PONV Risk Score and Plan: 2 and Ondansetron and Treatment may vary due to age or medical condition  Airway Management Planned: Oral ETT  Additional Equipment:   Intra-op Plan:   Post-operative Plan: Extubation in OR  Informed Consent: I have reviewed the patients History and Physical, chart, labs and discussed the procedure including the risks, benefits and alternatives for the proposed anesthesia with the patient or authorized representative who has indicated his/her understanding and acceptance.     Plan Discussed with: CRNA and Surgeon  Anesthesia Plan Comments:         Anesthesia Quick Evaluation

## 2017-10-27 NOTE — Transfer of Care (Signed)
Immediate Anesthesia Transfer of Care Note  Patient: Cynthia Herrera  Procedure(s) Performed: LEFT MASTECTOMY (Left Breast)  Patient Location: PACU  Anesthesia Type:General and Regional  Level of Consciousness: awake, alert , oriented and sedated  Airway & Oxygen Therapy: Patient Spontanous Breathing and Patient connected to face mask oxygen  Post-op Assessment: Report given to RN, Post -op Vital signs reviewed and stable and Patient moving all extremities  Post vital signs: Reviewed and stable  Last Vitals:  Vitals:   10/27/17 0948 10/27/17 1323  BP: (!) 136/48   Pulse: 76   Resp: 18   Temp:  (!) (P) 36.3 C  SpO2: 98%     Last Pain:  Vitals:   10/27/17 0948  TempSrc:   PainSc: 0-No pain      Patients Stated Pain Goal: 3 (27/61/47 0929)  Complications: No apparent anesthesia complications

## 2017-10-28 ENCOUNTER — Encounter (HOSPITAL_COMMUNITY): Payer: Self-pay | Admitting: General Surgery

## 2017-10-28 DIAGNOSIS — I110 Hypertensive heart disease with heart failure: Secondary | ICD-10-CM | POA: Diagnosis not present

## 2017-10-28 DIAGNOSIS — Z170 Estrogen receptor positive status [ER+]: Secondary | ICD-10-CM | POA: Diagnosis not present

## 2017-10-28 DIAGNOSIS — I509 Heart failure, unspecified: Secondary | ICD-10-CM | POA: Diagnosis not present

## 2017-10-28 DIAGNOSIS — Z923 Personal history of irradiation: Secondary | ICD-10-CM | POA: Diagnosis not present

## 2017-10-28 DIAGNOSIS — I4891 Unspecified atrial fibrillation: Secondary | ICD-10-CM | POA: Diagnosis not present

## 2017-10-28 DIAGNOSIS — Z79899 Other long term (current) drug therapy: Secondary | ICD-10-CM | POA: Diagnosis not present

## 2017-10-28 DIAGNOSIS — Z7901 Long term (current) use of anticoagulants: Secondary | ICD-10-CM | POA: Diagnosis not present

## 2017-10-28 DIAGNOSIS — J449 Chronic obstructive pulmonary disease, unspecified: Secondary | ICD-10-CM | POA: Diagnosis not present

## 2017-10-28 DIAGNOSIS — E119 Type 2 diabetes mellitus without complications: Secondary | ICD-10-CM | POA: Diagnosis not present

## 2017-10-28 DIAGNOSIS — M1990 Unspecified osteoarthritis, unspecified site: Secondary | ICD-10-CM | POA: Diagnosis not present

## 2017-10-28 DIAGNOSIS — D0512 Intraductal carcinoma in situ of left breast: Secondary | ICD-10-CM | POA: Diagnosis not present

## 2017-10-28 DIAGNOSIS — I2510 Atherosclerotic heart disease of native coronary artery without angina pectoris: Secondary | ICD-10-CM | POA: Diagnosis not present

## 2017-10-28 DIAGNOSIS — K219 Gastro-esophageal reflux disease without esophagitis: Secondary | ICD-10-CM | POA: Diagnosis not present

## 2017-10-28 DIAGNOSIS — Z9221 Personal history of antineoplastic chemotherapy: Secondary | ICD-10-CM | POA: Diagnosis not present

## 2017-10-28 DIAGNOSIS — Z7984 Long term (current) use of oral hypoglycemic drugs: Secondary | ICD-10-CM | POA: Diagnosis not present

## 2017-10-28 DIAGNOSIS — Z87891 Personal history of nicotine dependence: Secondary | ICD-10-CM | POA: Diagnosis not present

## 2017-10-28 LAB — GLUCOSE, CAPILLARY: Glucose-Capillary: 191 mg/dL — ABNORMAL HIGH (ref 65–99)

## 2017-10-28 MED ORDER — HYDROCODONE-ACETAMINOPHEN 5-325 MG PO TABS: 1.0000 | ORAL_TABLET | Freq: Four times a day (QID) | ORAL | 0 refills | Status: DC | PRN

## 2017-10-28 NOTE — Progress Notes (Signed)
1 Day Post-Op   Subjective/Chief Complaint: No complaints   Objective: Vital signs in last 24 hours: Temp:  [97.3 F (36.3 C)-98.2 F (36.8 C)] 97.6 F (36.4 C) (01/04 0400) Pulse Rate:  [69-79] 78 (01/03 2158) Resp:  [10-20] 18 (01/04 0400) BP: (107-161)/(48-74) 134/60 (01/04 0400) SpO2:  [94 %-100 %] 95 % (01/04 0400) Weight:  [115.2 kg (254 lb)] 115.2 kg (254 lb) (01/03 0832) Last BM Date: 10/26/17  Intake/Output from previous day: 01/03 0701 - 01/04 0700 In: 2983 [P.O.:1380; I.V.:1503; IV Piggyback:100] Out: 1805 [Urine:1675; Drains:30; Blood:100] Intake/Output this shift: No intake/output data recorded.  General appearance: alert and cooperative Resp: clear to auscultation bilaterally Chest wall: skin flaps look ok Cardio: regular rate and rhythm GI: soft, non-tender; bowel sounds normal; no masses,  no organomegaly  Lab Results:  Recent Labs    10/27/17 0806  WBC 8.7  HGB 12.2  HCT 37.9  PLT 346   BMET Recent Labs    10/27/17 0806  NA 136  K 3.5  CL 104  CO2 23  GLUCOSE 193*  BUN 23*  CREATININE 1.02*  CALCIUM 9.2   PT/INR Recent Labs    10/27/17 0806  LABPROT 12.8  INR 0.98   ABG No results for input(s): PHART, HCO3 in the last 72 hours.  Invalid input(s): PCO2, PO2  Studies/Results: No results found.  Anti-infectives: Anti-infectives (From admission, onward)   Start     Dose/Rate Route Frequency Ordered Stop   10/27/17 1048  polymyxin B 500,000 Units, bacitracin 50,000 Units in sodium chloride 0.9 % 500 mL irrigation  Status:  Discontinued       As needed 10/27/17 1048 10/27/17 1317   10/27/17 1030  ceFAZolin (ANCEF) IVPB 2g/100 mL premix     2 g 200 mL/hr over 30 Minutes Intravenous To ShortStay Surgical 10/27/17 0805 10/27/17 1153   10/27/17 0945  ceFAZolin (ANCEF) 3 g in dextrose 5 % 50 mL IVPB  Status:  Discontinued     3 g 130 mL/hr over 30 Minutes Intravenous On call to O.R. 10/27/17 0933 10/27/17 0934       Assessment/Plan: s/p Procedure(s): LEFT MASTECTOMY (Left) Advance diet Discharge  LOS: 1 day    TOTH III,Gia Lusher S 10/28/2017

## 2017-10-28 NOTE — Anesthesia Procedure Notes (Signed)
Anesthesia Regional Block: Pectoralis block   Pre-Anesthetic Checklist: ,, timeout performed, Correct Patient, Correct Site, Correct Laterality, Correct Procedure, Correct Position, site marked, Risks and benefits discussed,  Surgical consent,  Pre-op evaluation,  At surgeon's request and post-op pain management  Laterality: Left  Prep: chloraprep       Needles:  Injection technique: Single-shot     Needle Length: 9cm  Needle Gauge: 21     Additional Needles:   Narrative:  Start time: 10/27/2017 9:30 AM End time: 10/27/2017 9:40 AM Injection made incrementally with aspirations every 5 mL.  Performed by: Personally  Anesthesiologist: Lillia Abed, MD  Additional Notes: Monitors applied. Patient sedated. Sterile prep and drape,hand hygiene and sterile gloves were used. Relevant anatomy identified.Needle position confirmed.Local anesthetic injected incrementally after negative aspiration. Local anesthetic spread visualized. Vascular puncture avoided. No complications. Image printed for medical record.The patient tolerated the procedure well.

## 2017-10-28 NOTE — Addendum Note (Signed)
Addendum  created 10/28/17 1317 by Lillia Abed, MD   Child order released for a procedure order, Intraprocedure Blocks edited, Sign clinical note

## 2017-10-28 NOTE — Progress Notes (Signed)
Discharge home. Home discharge instructions given , no question verbalized.

## 2017-10-31 ENCOUNTER — Telehealth: Payer: Self-pay | Admitting: Internal Medicine

## 2017-10-31 NOTE — Telephone Encounter (Unsigned)
Copied from [place]. Topic: Quick Communication - See Telephone Encounter >> Oct 31, 2017 12:15 PM Boyd Kerbs wrote: CRM for notification. See Telephone encounter for:  Prescription needed for testing strips at [facility] on [address].  When she was in hospital her sugar was elevated. Has appts. For  Oncologist 1/15 and plastic surgery on 1/11.  Hospital on Thursday 1/3, but was not told to do a F/U with Dr. Raliegh Ip. Question if should come in.  10/31/17. She does not have any testing strips

## 2017-10-31 NOTE — Telephone Encounter (Signed)
Pt. Needs an order for One Touch Verio test strips. Thanks.

## 2017-11-01 ENCOUNTER — Other Ambulatory Visit: Payer: Self-pay | Admitting: Internal Medicine

## 2017-11-01 MED ORDER — GLUCOSE BLOOD VI STRP: ORAL_STRIP | 12 refills | Status: DC

## 2017-11-07 ENCOUNTER — Telehealth: Payer: Self-pay | Admitting: *Deleted

## 2017-11-07 ENCOUNTER — Inpatient Hospital Stay: Payer: Medicare Other | Attending: Hematology and Oncology | Admitting: Hematology and Oncology

## 2017-11-07 DIAGNOSIS — Z170 Estrogen receptor positive status [ER+]: Secondary | ICD-10-CM | POA: Diagnosis not present

## 2017-11-07 DIAGNOSIS — C50112 Malignant neoplasm of central portion of left female breast: Secondary | ICD-10-CM | POA: Diagnosis not present

## 2017-11-07 DIAGNOSIS — Z7984 Long term (current) use of oral hypoglycemic drugs: Secondary | ICD-10-CM | POA: Diagnosis not present

## 2017-11-07 DIAGNOSIS — Z7901 Long term (current) use of anticoagulants: Secondary | ICD-10-CM | POA: Diagnosis not present

## 2017-11-07 DIAGNOSIS — R918 Other nonspecific abnormal finding of lung field: Secondary | ICD-10-CM

## 2017-11-07 DIAGNOSIS — Z79899 Other long term (current) drug therapy: Secondary | ICD-10-CM

## 2017-11-07 DIAGNOSIS — Z9012 Acquired absence of left breast and nipple: Secondary | ICD-10-CM

## 2017-11-07 DIAGNOSIS — Z923 Personal history of irradiation: Secondary | ICD-10-CM | POA: Diagnosis not present

## 2017-11-07 DIAGNOSIS — Z8701 Personal history of pneumonia (recurrent): Secondary | ICD-10-CM

## 2017-11-07 DIAGNOSIS — Z9221 Personal history of antineoplastic chemotherapy: Secondary | ICD-10-CM | POA: Diagnosis not present

## 2017-11-07 NOTE — Telephone Encounter (Signed)
Ordered oncotype per Dr. Gudena.  Faxed requisition to pathology and confirmed receipt. 

## 2017-11-07 NOTE — Assessment & Plan Note (Signed)
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  Nonspecific pulmonary nodule left upper lobe 3 mm  Pathology counseling: I discussed the final pathology report of the patient provided  a copy of this report. I discussed the margins as well as lymph node surgeries. We also discussed the final staging along with previously performed ER/PR and HER-2/neu testing.  Plan: 1.  Oncotype DX testing 2. adjuvant chemotherapy based upon Oncotype  3.  Followed by antiestrogen therapy  Return to clinic based upon Oncotype test result.

## 2017-11-07 NOTE — Progress Notes (Signed)
Patient Care Team: Marletta Lor, MD as PCP - General  DIAGNOSIS:  Encounter Diagnosis  Name Primary?  . Malignant neoplasm of central portion of left breast in female, estrogen receptor positive ([place])     SUMMARY OF ONCOLOGIC HISTORY:   Malignant neoplasm of central portion of left breast in female, estrogen receptor positive ([place])   2002 Initial Diagnosis    Left breast cancer treated with lumpectomy followed by adjuvant chemotherapy (does not remember chemotherapy drugs) and radiation.  Did not take antiestrogen therapy      07/11/2017 Relapse/Recurrence    Left breast skin papule; biopsy proven to be invasive mammary carcinoma ER 100% PR 90% positive HER-2 negative ratio 1.64; Right breast 1.5 cm indeterminate calcifications biopsy proven to be complex sclerosing lesion      08/15/2017 Surgery    Left breast excision: Invasive carcinoma involving the dermis, ER 100%, PR 90%, HER-2 negative ratio 1.64.  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  She had a prolonged course of pneumonitis for which she was on multiple antibiotics.  Because of this her surgery was delayed.  She currently has a drain reports no major pain.  She is anxious to get the drain removed.  REVIEW OF SYSTEMS:   Constitutional: Denies fevers, chills or abnormal weight loss Eyes: Denies blurriness of vision Ears, nose, mouth, throat, and face: Denies  mucositis or sore throat Respiratory: Denies cough, dyspnea or wheezes Cardiovascular: Denies palpitation, chest discomfort Gastrointestinal:  Denies nausea, heartburn or change in bowel habits Skin: Denies abnormal skin rashes Lymphatics: Denies new lymphadenopathy or easy bruising Neurological:Denies numbness, tingling or new weaknesses Behavioral/Psych: Mood is stable, no new changes  Extremities: No lower extremity edema Breast: Recent left mastectomy All other systems were reviewed with the patient and are negative.  I have reviewed the past medical history, past surgical history, social history and family history with the patient and they are unchanged from previous note.  ALLERGIES:  is allergic to ambien [zolpidem]; codeine phosphate; and medrol [methylprednisolone].  MEDICATIONS:  Current Outpatient Medications  Medication Sig Dispense Refill  . amoxicillin (AMOXIL) 500 MG capsule Take 2,000 mg by mouth See admin instructions. Take 2000 mg 1 hour prior to dental appt.  5  . atorvastatin (LIPITOR) 40 MG tablet TAKE 1 TABLET (40 MG TOTAL) BY MOUTH AT BEDTIME. (Patient taking differently: TAKE 1 TABLET (40 MG TOTAL) BY MOUTH DAILY) 90 tablet 3  . cephALEXin (KEFLEX) 500 MG capsule Take 500 mg by mouth 4 (four) times daily.  0  . Cholecalciferol (VITAMIN D3) 2000 units capsule Take 2,000 Units by mouth daily.    . fexofenadine (ALLEGRA) 180 MG tablet Take 180 mg by mouth daily as needed for allergies or rhinitis.    . fluticasone (FLONASE) 50 MCG/ACT nasal spray Place 1 spray into both nostrils at bedtime as needed for allergies.     . furosemide (LASIX) 20 MG tablet Take 1 tablet (20 mg total)  by mouth 2 (two) times daily. (Patient taking differently: Take 40 mg by mouth daily. ) 180 tablet 3  . glucose blood test strip Use to check CBG AC and QHS. 200 each 12  . guaiFENesin (MUCINEX) 600 MG 12 hr tablet Take 600 mg by mouth 2 (two) times daily as needed for cough or to loosen  phlegm.    Marland Kitchen HYDROcodone-acetaminophen (NORCO/VICODIN) 5-325 MG tablet Take 1 tablet by mouth every 6 (six) hours as needed for pain.  0  . HYDROcodone-acetaminophen (NORCO/VICODIN) 5-325 MG tablet Take 1-2 tablets by mouth every 6 (six) hours as needed for moderate pain. 15 tablet 0  . KLOR-CON M20 20 MEQ tablet Take 1 tablet (20 mEq total) by mouth daily. 90 tablet 3  . losartan (COZAAR) 25 MG tablet TAKE 1 TABLET (25 MG TOTAL) BY MOUTH 2 (TWO) TIMES DAILY. 180 tablet 2  . metFORMIN (GLUCOPHAGE) 500 MG tablet TAKE 1 TABLET TWICE A DAY WITH A MEAL 180 tablet 1  . nebivolol (BYSTOLIC) 2.5 MG tablet Take 1 tablet (2.5 mg total) by mouth daily. 30 tablet 6  . spironolactone (ALDACTONE) 25 MG tablet Take 1 tablet (25 mg total) by mouth daily. 90 tablet 3  . traMADol (ULTRAM) 50 MG tablet Take 1-2 tablets (50-100 mg total) by mouth every 6 (six) hours as needed. (Patient taking differently: Take 50 mg by mouth daily as needed for moderate pain. ) 20 tablet 0  . XARELTO 20 MG TABS tablet Take 20 mg by mouth daily with supper.  6   No current facility-administered medications for this visit.     PHYSICAL EXAMINATION: ECOG PERFORMANCE STATUS: 1 - Symptomatic but completely ambulatory  Vitals:   11/07/17 1146  BP: (!) 146/69  Pulse: 80  Resp: 18  Temp: (!) 97.3 F (36.3 C)  SpO2: 92%   Filed Weights   11/07/17 1146  Weight: 250 lb (113.4 kg)    GENERAL:alert, no distress and comfortable SKIN: skin color, texture, turgor are normal, no rashes or significant lesions EYES: normal, Conjunctiva are pink and non-injected, sclera clear OROPHARYNX:no exudate, no erythema and lips, buccal mucosa, and tongue normal  NECK: supple, thyroid normal size, non-tender, without nodularity LYMPH:  no palpable lymphadenopathy in the cervical, axillary or inguinal LUNGS: clear to auscultation and percussion with normal breathing effort HEART: regular rate & rhythm and no murmurs and no lower extremity  edema ABDOMEN:abdomen soft, non-tender and normal bowel sounds MUSCULOSKELETAL:no cyanosis of digits and no clubbing  NEURO: alert & oriented x 3 with fluent speech, no focal motor/sensory deficits EXTREMITIES: No lower extremity edema   LABORATORY DATA:  I have reviewed the data as listed CMP Latest Ref Rng & Units 10/27/2017 09/29/2017 08/09/2017  Glucose 65 - 99 mg/dL 193(H) 217(H) 151(H)  BUN 6 - 20 mg/dL 23(H) 21(H) 15  Creatinine 0.44 - 1.00 mg/dL 1.02(H) 1.35(H) 1.14(H)  Sodium 135 - 145 mmol/L 136 136 138  Potassium 3.5 - 5.1 mmol/L 3.5 3.7 3.6  Chloride 101 - 111 mmol/L 104 102 105  CO2 22 - 32 mmol/L '23 24 26  '$ Calcium 8.9 - 10.3 mg/dL 9.2 9.2 9.2  Total Protein 6.0 - 8.3 g/dL - - -  Total Bilirubin 0.2 - 1.2 mg/dL - - -  Alkaline Phos 39 - 117 U/L - - -  AST 0 - 37 U/L - - -  ALT 0 - 35 U/L - - -    Lab Results  Component Value Date   WBC 8.7 10/27/2017  HGB 12.2 10/27/2017   HCT 37.9 10/27/2017   MCV 90.9 10/27/2017   PLT 346 10/27/2017   NEUTROABS 7.5 02/03/2017    ASSESSMENT & PLAN:  Malignant neoplasm of central portion of left breast in female, estrogen receptor positive ([place]) 10/27/2017: Left simple mastectomy: Recurrent multifocal IDC grade 2 largest spanning 3.5 cm, 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  Nonspecific pulmonary nodule left upper lobe 3 mm  Pathology counseling: I discussed the final pathology report of the patient provided  a copy of this report. I discussed the margins as well as lymph node surgeries. We also discussed the final staging along with previously performed ER/PR and HER-2/neu testing.  Plan: 1.  Oncotype DX testing 2. adjuvant chemotherapy based upon Oncotype  3.  Followed by antiestrogen therapy  Return to  clinic based upon Oncotype test result.   I spent 25 minutes talking to the patient of which more than half was spent in counseling and coordination of care.  No orders of the defined types were placed in this encounter.  The patient has a good understanding of the overall plan. she agrees with it. she will call with any problems that may develop before the next visit here.   Harriette Ohara, MD 11/07/17

## 2017-11-08 ENCOUNTER — Telehealth: Payer: Self-pay | Admitting: Hematology and Oncology

## 2017-11-08 NOTE — Telephone Encounter (Signed)
No 1/14 los.

## 2017-11-11 ENCOUNTER — Telehealth: Payer: Self-pay | Admitting: *Deleted

## 2017-11-11 NOTE — Telephone Encounter (Signed)
Received notification from [facility] that oncotype will be cancelled d/t unable to test recurrent breast cancer. Physician team notified. Called pt to discuss and to schedule appt with Dr.Gudena. Scheduled and confirmed appt on 1/21 at 9:15am to discuss treatment options. Denies further questions or needs at this time.

## 2017-11-14 ENCOUNTER — Telehealth: Payer: Self-pay | Admitting: Hematology and Oncology

## 2017-11-14 ENCOUNTER — Inpatient Hospital Stay (HOSPITAL_BASED_OUTPATIENT_CLINIC_OR_DEPARTMENT_OTHER): Payer: Medicare Other | Admitting: Hematology and Oncology

## 2017-11-14 DIAGNOSIS — Z9012 Acquired absence of left breast and nipple: Secondary | ICD-10-CM

## 2017-11-14 DIAGNOSIS — Z170 Estrogen receptor positive status [ER+]: Secondary | ICD-10-CM | POA: Diagnosis not present

## 2017-11-14 DIAGNOSIS — Z79899 Other long term (current) drug therapy: Secondary | ICD-10-CM

## 2017-11-14 DIAGNOSIS — R918 Other nonspecific abnormal finding of lung field: Secondary | ICD-10-CM | POA: Diagnosis not present

## 2017-11-14 DIAGNOSIS — Z923 Personal history of irradiation: Secondary | ICD-10-CM

## 2017-11-14 DIAGNOSIS — Z7901 Long term (current) use of anticoagulants: Secondary | ICD-10-CM | POA: Diagnosis not present

## 2017-11-14 DIAGNOSIS — Z8701 Personal history of pneumonia (recurrent): Secondary | ICD-10-CM | POA: Diagnosis not present

## 2017-11-14 DIAGNOSIS — C50112 Malignant neoplasm of central portion of left female breast: Secondary | ICD-10-CM

## 2017-11-14 DIAGNOSIS — Z7984 Long term (current) use of oral hypoglycemic drugs: Secondary | ICD-10-CM | POA: Diagnosis not present

## 2017-11-14 DIAGNOSIS — Z9221 Personal history of antineoplastic chemotherapy: Secondary | ICD-10-CM

## 2017-11-14 MED ORDER — LETROZOLE 2.5 MG PO TABS: 2.5000 mg | ORAL_TABLET | Freq: Every day | ORAL | 3 refills | Status: DC

## 2017-11-14 NOTE — Assessment & Plan Note (Signed)
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  Nonspecific pulmonary nodule left upper lobe 3 mm  Oncotype testing could not be performed because it was felt that the tumor was a recurrent cancer and Oncotype DX has no ability in this situation.   Recommendation: adjuvant antiestrogen therapy with letrozole 2.5 mg daily Chemotherapy would have some benefit in her situation primarily because of the multifocality and skin involvement.  We discussed the pros and cons of chemotherapy.  After listening to all the options, she decided against going through chemo.    Return to clinic in 3 months to assess toxicities of antiestrogen therapy

## 2017-11-14 NOTE — Telephone Encounter (Signed)
Gave patient AVs and calendar of upcoming April appointments.  °

## 2017-11-14 NOTE — Progress Notes (Signed)
 Patient Care Team: Herrera, Cynthia F, MD as PCP - General  DIAGNOSIS:  Encounter Diagnosis  Name Primary?  . Malignant neoplasm of central portion of left breast in female, estrogen receptor positive ([facility])     SUMMARY OF ONCOLOGIC HISTORY:   Malignant neoplasm of central portion of left breast in female, estrogen receptor positive ([facility])   2002 Initial Diagnosis    Left breast cancer treated with lumpectomy followed by adjuvant chemotherapy (does not remember chemotherapy drugs) and radiation.  Did not take antiestrogen therapy      07/11/2017 Relapse/Recurrence    Left breast skin papule; biopsy proven to be invasive mammary carcinoma ER 100% PR 90% positive HER-2 negative ratio 1.64; Right breast 1.5 cm indeterminate calcifications biopsy proven to be complex sclerosing lesion      08/15/2017 Surgery    Left breast excision: Invasive carcinoma involving the dermis, ER 100%, PR 90%, HER-2 negative ratio 1.64.  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  Unfortunately Oncotype DX could not be performed because this is a recurrent malignancy.  She is recovering very well from the surgery and is healing very well.  REVIEW OF SYSTEMS:   Constitutional: Denies fevers, chills or abnormal weight loss Eyes: Denies blurriness of vision Ears, nose, mouth, throat, and face: Denies mucositis or sore throat Respiratory: Denies cough, dyspnea or  wheezes Cardiovascular: Denies palpitation, chest discomfort Gastrointestinal:  Denies nausea, heartburn or change in bowel habits Skin: Denies abnormal skin rashes Lymphatics: Denies new lymphadenopathy or easy bruising Neurological:Denies numbness, tingling or new weaknesses Behavioral/Psych: Mood is stable, no new changes  Extremities: No lower extremity edema Breast: Left mastectomy All other systems were reviewed with the patient and are negative.  I have reviewed the past medical history, past surgical history, social history and family history with the patient and they are unchanged from previous note.  ALLERGIES:  is allergic to ambien [zolpidem]; codeine phosphate; and medrol [methylprednisolone].  MEDICATIONS:  Current Outpatient Medications  Medication Sig Dispense Refill  . amoxicillin (AMOXIL) 500 MG capsule Take 2,000 mg by mouth See admin instructions. Take 2000 mg 1 hour prior to dental appt.  5  . atorvastatin (LIPITOR) 40 MG tablet TAKE 1 TABLET (40 MG TOTAL) BY MOUTH AT BEDTIME. (Patient taking differently: TAKE 1 TABLET (40 MG TOTAL) BY MOUTH DAILY) 90 tablet 3  . Cholecalciferol (VITAMIN D3) 2000 units capsule Take 2,000 Units by mouth daily.    . fexofenadine (ALLEGRA) 180 MG tablet Take 180 mg by mouth daily as needed for allergies or rhinitis.    . fluticasone (FLONASE) 50 MCG/ACT nasal spray Place 1 spray into both nostrils at bedtime as needed for allergies.     . furosemide (LASIX) 20 MG tablet Take 1 tablet (20 mg total) by mouth 2 (two) times daily. (Patient taking differently: Take 40 mg by mouth daily. ) 180 tablet 3  . glucose blood test strip Use to check CBG AC and QHS. 200   each 12  . guaiFENesin (MUCINEX) 600 MG 12 hr tablet Take 600 mg by mouth 2 (two) times daily as needed for cough or to loosen phlegm.    Marland Kitchen KLOR-CON M20 20 MEQ tablet Take 1 tablet (20 mEq total) by mouth daily. 90 tablet 3  . letrozole (FEMARA) 2.5 MG tablet Take 1 tablet (2.5 mg  total) by mouth daily. 90 tablet 3  . losartan (COZAAR) 25 MG tablet TAKE 1 TABLET (25 MG TOTAL) BY MOUTH 2 (TWO) TIMES DAILY. 180 tablet 2  . metFORMIN (GLUCOPHAGE) 500 MG tablet TAKE 1 TABLET TWICE A DAY WITH A MEAL 180 tablet 1  . nebivolol (BYSTOLIC) 2.5 MG tablet Take 1 tablet (2.5 mg total) by mouth daily. 30 tablet 6  . spironolactone (ALDACTONE) 25 MG tablet Take 1 tablet (25 mg total) by mouth daily. 90 tablet 3  . traMADol (ULTRAM) 50 MG tablet Take 1-2 tablets (50-100 mg total) by mouth every 6 (six) hours as needed. (Patient taking differently: Take 50 mg by mouth daily as needed for moderate pain. ) 20 tablet 0  . XARELTO 20 MG TABS tablet Take 20 mg by mouth daily with supper.  6   No current facility-administered medications for this visit.     PHYSICAL EXAMINATION: ECOG PERFORMANCE STATUS: 1 - Symptomatic but completely ambulatory  Vitals:   11/14/17 0908  BP: 102/61  Pulse: 77  Resp: 20  Temp: (!) 97.4 F (36.3 C)  SpO2: 96%   Filed Weights   11/14/17 0908  Weight: 252 lb 1.6 oz (114.4 kg)    GENERAL:alert, no distress and comfortable SKIN: skin color, texture, turgor are normal, no rashes or significant lesions EYES: normal, Conjunctiva are pink and non-injected, sclera clear OROPHARYNX:no exudate, no erythema and lips, buccal mucosa, and tongue normal  NECK: supple, thyroid normal size, non-tender, without nodularity LYMPH:  no palpable lymphadenopathy in the cervical, axillary or inguinal LUNGS: clear to auscultation and percussion with normal breathing effort HEART: regular rate & rhythm and no murmurs and no lower extremity edema ABDOMEN:abdomen soft, non-tender and normal bowel sounds MUSCULOSKELETAL:no cyanosis of digits and no clubbing  NEURO: alert & oriented x 3 with fluent speech, no focal motor/sensory deficits EXTREMITIES: No lower extremity edema  LABORATORY DATA:  I have reviewed the data as listed CMP Latest Ref Rng & Units 10/27/2017  09/29/2017 08/09/2017  Glucose 65 - 99 mg/dL 193(H) 217(H) 151(H)  BUN 6 - 20 mg/dL 23(H) 21(H) 15  Creatinine 0.44 - 1.00 mg/dL 1.02(H) 1.35(H) 1.14(H)  Sodium 135 - 145 mmol/L 136 136 138  Potassium 3.5 - 5.1 mmol/L 3.5 3.7 3.6  Chloride 101 - 111 mmol/L 104 102 105  CO2 22 - 32 mmol/L _0 Calcium 8.9 - 10.3 mg/dL 9.2 9.2 9.2  Total Protein 6.0 - 8.3 g/dL - - -  Total Bilirubin 0.2 - 1.2 mg/dL - - -  Alkaline Phos 39 - 117 U/L - - -  AST 0 - 37 U/L - - -  ALT 0 - 35 U/L - - -    Lab Results  Component Value Date   WBC 8.7 10/27/2017   HGB 12.2 10/27/2017   HCT 37.9 10/27/2017   MCV 90.9 10/27/2017   PLT 346 10/27/2017   NEUTROABS 7.5 02/03/2017    ASSESSMENT & PLAN:  Malignant neoplasm of central portion of left breast in female, estrogen receptor positive ([place]) 10/27/2017: Left simple mastectomy: Recurrent multifocal IDC grade 2 largest spanning 3.5 cm, low  to high-grade DCIS, tumor involves nipple and epidermis, margins negative, left chest wall excision IDC involving the anterior margin ER 100%, PR 90%, HER-2 negative; T4 Nx stage 3b (2002: Left breast cancer treated with lumpectomy followed by adjuvant chemo and radiation, did not take antiestrogen therapy)  CT chest abdomen pelvis 09/07/2017: No specific evidence of metastases.  Nonspecific pulmonary nodule left upper lobe 3 mm  Oncotype testing could not be performed because it was felt that the tumor was a recurrent cancer and Oncotype DX has no ability in this situation.   Recommendation: adjuvant antiestrogen therapy with letrozole 2.5 mg daily Chemotherapy would have some benefit in her situation primarily because of the multifocality and skin involvement.  We discussed the pros and cons of chemotherapy.  After listening to all the options, she decided against going through chemo.  She is particularly not interested in any treatment that makes her lose her hair because of her previous experience with it.  Return  to clinic in 3 months to assess toxicities of antiestrogen therapy   I spent 25 minutes talking to the patient of which more than half was spent in counseling and coordination of care.  No orders of the defined types were placed in this encounter.  The patient has a good understanding of the overall plan. she agrees with it. she will call with any problems that may develop before the next visit here.   Viinay K Gudena, MD 11/14/17    

## 2017-12-02 DIAGNOSIS — N6489 Other specified disorders of breast: Secondary | ICD-10-CM | POA: Insufficient documentation

## 2017-12-15 ENCOUNTER — Other Ambulatory Visit: Payer: Self-pay | Admitting: General Surgery

## 2017-12-15 DIAGNOSIS — E041 Nontoxic single thyroid nodule: Secondary | ICD-10-CM

## 2017-12-22 ENCOUNTER — Ambulatory Visit
Admission: RE | Admit: 2017-12-22 | Discharge: 2017-12-22 | Disposition: A | Discharge status: Home / Self Care | Payer: Medicare Other | Source: Ambulatory Visit | Attending: General Surgery | Admitting: General Surgery

## 2017-12-22 DIAGNOSIS — E041 Nontoxic single thyroid nodule: Secondary | ICD-10-CM | POA: Diagnosis not present

## 2017-12-23 ENCOUNTER — Other Ambulatory Visit: Payer: Self-pay

## 2017-12-23 DIAGNOSIS — E041 Nontoxic single thyroid nodule: Secondary | ICD-10-CM | POA: Diagnosis not present

## 2017-12-23 MED ORDER — NEBIVOLOL HCL 2.5 MG PO TABS: 2.5000 mg | ORAL_TABLET | Freq: Every day | ORAL | 6 refills | Status: DC

## 2017-12-27 ENCOUNTER — Other Ambulatory Visit: Payer: Self-pay | Admitting: Otolaryngology

## 2017-12-27 ENCOUNTER — Ambulatory Visit: Payer: Self-pay | Admitting: Plastic Surgery

## 2017-12-27 DIAGNOSIS — E041 Nontoxic single thyroid nodule: Secondary | ICD-10-CM

## 2017-12-27 DIAGNOSIS — N651 Disproportion of reconstructed breast: Secondary | ICD-10-CM

## 2017-12-27 DIAGNOSIS — N6489 Other specified disorders of breast: Secondary | ICD-10-CM

## 2017-12-31 ENCOUNTER — Other Ambulatory Visit: Payer: Self-pay | Admitting: Cardiology

## 2018-01-02 ENCOUNTER — Other Ambulatory Visit: Payer: Self-pay

## 2018-01-02 ENCOUNTER — Encounter (HOSPITAL_COMMUNITY)
Admission: RE | Admit: 2018-01-02 | Discharge: 2018-01-02 | Disposition: A | Discharge status: Home / Self Care | Payer: Medicare Other | Source: Ambulatory Visit | Attending: Plastic Surgery | Admitting: Plastic Surgery

## 2018-01-02 ENCOUNTER — Encounter (HOSPITAL_COMMUNITY): Payer: Self-pay

## 2018-01-02 DIAGNOSIS — N651 Disproportion of reconstructed breast: Secondary | ICD-10-CM | POA: Diagnosis not present

## 2018-01-02 DIAGNOSIS — N6489 Other specified disorders of breast: Secondary | ICD-10-CM | POA: Diagnosis not present

## 2018-01-02 DIAGNOSIS — Z01818 Encounter for other preprocedural examination: Secondary | ICD-10-CM | POA: Diagnosis not present

## 2018-01-02 HISTORY — DX: Nontoxic single thyroid nodule: E04.1

## 2018-01-02 HISTORY — DX: Dyspnea, unspecified: R06.00

## 2018-01-02 LAB — BASIC METABOLIC PANEL
ANION GAP: 11 (ref 5–15)
BUN: 17 mg/dL (ref 6–20)
CALCIUM: 9.8 mg/dL (ref 8.9–10.3)
CO2: 22 mmol/L (ref 22–32)
Chloride: 105 mmol/L (ref 101–111)
Creatinine, Ser: 1.06 mg/dL — ABNORMAL HIGH (ref 0.44–1.00)
GFR calc non Af Amer: 51 mL/min — ABNORMAL LOW (ref >60)
GFR, EST AFRICAN AMERICAN: 59 mL/min — AB (ref >60)
GLUCOSE: 210 mg/dL — AB (ref 65–99)
POTASSIUM: 3.6 mmol/L (ref 3.5–5.1)
SODIUM: 138 mmol/L (ref 135–145)

## 2018-01-02 LAB — PROTIME-INR
INR: 1.1
PROTHROMBIN TIME: 14.1 s (ref 11.4–15.2)

## 2018-01-02 LAB — CBC
HEMATOCRIT: 37.8 % (ref 36.0–46.0)
HEMOGLOBIN: 12.5 g/dL (ref 12.0–15.0)
MCH: 29.8 pg (ref 26.0–34.0)
MCHC: 33.1 g/dL (ref 30.0–36.0)
MCV: 90.2 fL (ref 78.0–100.0)
Platelets: 335 10*3/uL (ref 150–400)
RBC: 4.19 MIL/uL (ref 3.87–5.11)
RDW: 14.1 % (ref 11.5–15.5)
WBC: 9.1 10*3/uL (ref 4.0–10.5)

## 2018-01-02 LAB — GLUCOSE, CAPILLARY: GLUCOSE-CAPILLARY: 235 mg/dL — AB (ref 65–99)

## 2018-01-02 LAB — HEMOGLOBIN A1C
HEMOGLOBIN A1C: 6.2 % — AB (ref 4.8–5.6)
Mean Plasma Glucose: 131.24 mg/dL

## 2018-01-02 NOTE — Pre-Procedure Instructions (Signed)
JACQUES WILLINGHAM  01/02/2018      [id number] - , [place] - [place]. AT [address] [place] [address] Phone: [phone] Fax: [phone]    Your procedure is scheduled on January 05, 2018.  Report to [facility] Admitting at 1000 A.M.  Call this number if you have problems the morning of surgery:  [phone]   Remember:  Do not eat food or drink liquids after midnight.  Take these medicines the morning of surgery with A SIP OF WATER nebivolol (bystolic), letrozole (femara), fexofenadine (allegra)-if needed, flonase nasal spray-if needed, tramadol (ultram)-if needed for pain.  Stop/resume xarelto as instructed by Engineer, production.  7 days prior to surgery STOP taking any Aspirin (unless otherwise instructed by your surgeon), Aleve, Naproxen, Ibuprofen, Motrin, Advil, Goody's, BC's, all herbal medications, fish oil, and all vitamins  Continue all other medications as instructed by your physician except follow the above medication instructions before surgery  WHAT DO I DO ABOUT MY DIABETES MEDICATION?  Marland Kitchen Do not take oral diabetes medicines (pills) the morning of surgery metformin (glucophage).  . The day of surgery, do not take other diabetes injectables, including Byetta (exenatide), Bydureon (exenatide ER), Victoza (liraglutide), or Trulicity (dulaglutide).  If your CBG is greater than 220 mg/dL, you may take  of your sliding scale (correction) dose of insulin.How to Manage Your Diabetes Before and After Surgery  Why is it important to control my blood sugar before and after surgery? . Improving blood sugar levels before and after surgery helps healing and can limit problems. . A way of improving blood sugar control is eating a healthy diet by: o  Eating less sugar and carbohydrates o  Increasing activity/exercise o  Talking with your doctor about reaching your blood sugar goals . High blood  sugars (greater than 180 mg/dL) can raise your risk of infections and slow your recovery, so you will need to focus on controlling your diabetes during the weeks before surgery. . Make sure that the doctor who takes care of your diabetes knows about your planned surgery including the date and location.  How do I manage my blood sugar before surgery? . Check your blood sugar at least 4 times a day, starting 2 days before surgery, to make sure that the level is not too high or low. o Check your blood sugar the morning of your surgery when you wake up and every 2 hours until you get to the Short Stay unit. . If your blood sugar is less than 70 mg/dL, you will need to treat for low blood sugar: o Do not take insulin. o Treat a low blood sugar (less than 70 mg/dL) with  cup of clear juice (cranberry or apple), 4 glucose tablets, OR glucose gel. Recheck blood sugar in 15 minutes after treatment (to make sure it is greater than 70 mg/dL). If your blood sugar is not greater than 70 mg/dL on recheck, call [phone] o  for further instructions. . Report your blood sugar to the short stay nurse when you get to Short Stay.  . If you are admitted to the hospital after surgery: o Your blood sugar will be checked by the staff and you will probably be given insulin after surgery (instead of oral diabetes medicines) to make sure you have good blood sugar levels. o The goal for blood sugar control after surgery is 80-180 mg/dL.  Reviewed and Endorsed by [facility] Patient Education  Committee, August 2015   Do not wear jewelry, make-up or nail polish.  Do not wear lotions, powders, or perfumes, or deodorant.  Do not shave 48 hours prior to surgery.    Do not bring valuables to the hospital.  [facility] is not responsible for any belongings or valuables.  Contacts, dentures or bridgework may not be worn into surgery.  Leave your suitcase in the car.  After surgery it may be brought to your room.  For  patients admitted to the hospital, discharge time will be determined by your treatment team.  Patients discharged the day of surgery will not be allowed to drive home.   Special instructions:  [place]- Preparing For Surgery  Before surgery, you can play an important role. Because skin is not sterile, your skin needs to be as free of germs as possible. You can reduce the number of germs on your skin by washing with CHG (chlorahexidine gluconate) Soap before surgery.  CHG is an antiseptic cleaner which kills germs and bonds with the skin to continue killing germs even after washing.  Please do not use if you have an allergy to CHG or antibacterial soaps. If your skin becomes reddened/irritated stop using the CHG.  Do not shave (including legs and underarms) for at least 48 hours prior to first CHG shower. It is OK to shave your face.  Please follow these instructions carefully.   1. Shower the NIGHT BEFORE SURGERY and the MORNING OF SURGERY with CHG.   2. If you chose to wash your hair, wash your hair first as usual with your normal shampoo.  3. After you shampoo, rinse your hair and body thoroughly to remove the shampoo.  4. Use CHG as you would any other liquid soap. You can apply CHG directly to the skin and wash gently with a scrungie or a clean washcloth.   5. Apply the CHG Soap to your body ONLY FROM THE NECK DOWN.  Do not use on open wounds or open sores. Avoid contact with your eyes, ears, mouth and genitals (private parts). Wash Face and genitals (private parts)  with your normal soap.  6. Wash thoroughly, paying special attention to the area where your surgery will be performed.  7. Thoroughly rinse your body with warm water from the neck down.  8. DO NOT shower/wash with your normal soap after using and rinsing off the CHG Soap.  9. Pat yourself dry with a CLEAN TOWEL.  10. Wear CLEAN PAJAMAS to bed the night before surgery, wear comfortable clothes the morning of  surgery  11. Place CLEAN SHEETS on your bed the night of your first shower and DO NOT SLEEP WITH PETS.  Day of Surgery: Do not apply any deodorants/lotions. Please wear clean clothes to the hospital/surgery center.    Please read over the following fact sheets that you were given. Pain Booklet, Coughing and Deep Breathing and Surgical Site Infection Prevention

## 2018-01-02 NOTE — Telephone Encounter (Signed)
Pt saw Dr Radford Pax 08/17/17. Last SCr was 1.02 on 10/27/17. Weight from 11/14/17 was 114.4Kg. CrCl was 90 mL/min. Will refill Xarelto 20mg .

## 2018-01-02 NOTE — Progress Notes (Signed)
PCP: Dr. Burnice Logan Cardiologist: Dr. Radford Pax  EKG: 08/15/2017 CXR: denies ECHO: 08/2014 Stress Test: denies Cardiac Cath: 2015  Reports occasionally checking her CBG at home, maybe 1-2 times per week.  Pt reports she was instructed to stop Xarelto after 01-02-18 dose  Patient denies shortness of breath, fever, cough, and chest pain at PAT appointment.  Patient verbalized understanding of instructions provided today at the PAT appointment.  Patient asked to review instructions at home and day of surgery.

## 2018-01-03 ENCOUNTER — Encounter (HOSPITAL_COMMUNITY): Payer: Self-pay | Admitting: Vascular Surgery

## 2018-01-03 NOTE — Progress Notes (Signed)
Anesthesia Chart Review:  Pt is a 72 year old female scheduled for R mammary reduction on 01/05/2018 with Audelia Hives, DO.   - PCP is Bluford Kaufmann, MD - Oncologist is Nicholas Lose, MD - Cardiologist is [name], MD, last office visit 08/17/17.   PMH includes:CAD (widely patent but calcified coronaries by 2015 cath), dilated cardiomyopathy, chronic CHF, atrial fibrillation, HTN, DM, hyperlipidemia, COPD, breast cancer, GERD.  - Former smoker. BMI 40.5.  - S/p L mastectomy 10/27/17. S/p R breast lumpectomy 08/15/17. 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 S/p lumbar laminectomy 03/19/14.   Medications include: Lipitor, Lasix, potassium, losartan, metformin, nebivolol, sprionolactone, xarelto. Last dose xarelto 01/02/18.   BP (!) 135/54   Pulse 69   Temp (!) 36.3 C   Resp 20   Ht 5\' 6"  (1.676 m)   Wt 251 lb 3.2 oz (113.9 kg)   SpO2 95%   BMI 40.54 kg/m     Preoperative labs reviewed. - HbA1c 6.2, glucose 210  EKG 08/15/17:  - NSR. Septal infarct, age undetermined. ST & T wave abnormality, consider anterior ischemia.  - Appears stable compared with EKG 08/11/16.   Echo 08/26/14: - Left ventricle: The cavity size was normal. Wall thickness wasnormal. Systolic function was normal. The estimated ejectionfraction was in the range of 50% to 55%. Wall motion was normal;there were no regional wall motion abnormalities. Dopplerparameters are consistent with abnormal left ventricularrelaxation (grade 1 diastolic dysfunction). - Aortic valve: Valve area (VTI): 2.06 cm^2. Valve area (Vmean):1.67 cm^2.  Cardiac cath 02/01/14: 1. Calcified vessels which are widely patent with no obstructive lesions  2. Mildly LV dysfunction EF 45%  3. Normal right heart pressures  REC: Continue current medications.  If no changes, I anticipate pt can proceed with surgery as scheduled.   Willeen Cass, FNP-BC [facility] Short Stay Surgical Center/Anesthesiology Phone: [phone] 01/03/2018 10:46  AM

## 2018-01-04 ENCOUNTER — Telehealth: Payer: Self-pay | Admitting: Cardiology

## 2018-01-04 NOTE — Telephone Encounter (Signed)
New message    STAT if patient feels like he/she is going to faint   1) Are you dizzy now?  SOME  2) Do you feel faint or have you passed out? NO  3) Do you have any other symptoms? NO  4) Have you checked your HR and BP (record if available)? 154/110, 144/54

## 2018-01-04 NOTE — Telephone Encounter (Signed)
Me   01/04/18 10:11 AM  Note    Spoke with patient. Pt c/o mild dizziness and lightheadedness this morning. Patient is scheduled for surgery tomorrow, during preop appt yesterday BP was 1554/110, few minutes later BP 144/54. Patient states she is a little nervous about her upcoming surgery. Patient denies cp, sob, or syncope. Per Dr. Radford Pax have patient f/u with her primary MD today for blood pressure check, pt states she has an appointment this morning with her surgeon at 11:30 and she will have the nurse recheck blood pressure. Informed patient to call back with any additional symptoms or concerns. She verbalized understanding and thankful for the call      Patient called back requesting an appt. Patient states during her OV today BP was 130s/59. She just feels a little lightheaded. She states surgery has been postponed until she is cleared by her cardiologist. Patient scheduled with Ellen Henri, [place] on 01/05/18. Patient in agreement with plan and thankful for the call.

## 2018-01-04 NOTE — Telephone Encounter (Signed)
Spoke with patient. Pt c/o mild dizziness and lightheadedness this morning. Patient is scheduled for surgery tomorrow, during preop appt BP was 1554/110, few minutes later BP 144/54. Patient states she is a little nervous about her up coming surgery. Patient denies cp, sob, or syncope. Per Dr. Radford Pax have patient f/u with her primary MD today for blood pressure check, pt states she has an appointment this morning with her surgeon at 11:30 and she will get office to recheck blood pressure. Informed patient to call back with any additional symptoms or concerns. She verbalized understanding and thankful for the call

## 2018-01-04 NOTE — Telephone Encounter (Signed)
NEW MESSAGE       [facility] Pre-operative Risk Assessment    Request for surgical clearance:  1. What type of surgery is being performed? THYROID BIOPSY   2. When is this surgery scheduled? TBD  3. What type of clearance is required (medical clearance vs. Pharmacy clearance to hold med vs. Both)? PHARMACY  4. Are there any medications that need to be held prior to surgery and how long? [place]  5. Practice name and name of physician performing surgery? [facility]  6. What is your office phone and fax number? FAX [phone], PHONE [phone] ATTN: SHELBY  7. Anesthesia type (None, local, MAC, general) ? [place] 01/04/2018, 9:39 AM  _________________________________________________________________   (provider comments below)

## 2018-01-04 NOTE — Telephone Encounter (Signed)
New message    Patient returning a call to nurse. Patient states she wants to discuss additional concerns. Please call

## 2018-01-05 ENCOUNTER — Encounter (HOSPITAL_COMMUNITY): Admission: RE | Payer: Self-pay | Source: Ambulatory Visit

## 2018-01-05 ENCOUNTER — Ambulatory Visit: Payer: Medicare Other | Admitting: Cardiology

## 2018-01-05 ENCOUNTER — Ambulatory Visit (HOSPITAL_COMMUNITY): Admission: RE | Admit: 2018-01-05 | Payer: Medicare Other | Source: Ambulatory Visit | Admitting: Plastic Surgery

## 2018-01-05 ENCOUNTER — Encounter: Payer: Self-pay | Admitting: Cardiology

## 2018-01-05 VITALS — BP 126/80 | HR 73 | Ht 66.0 in | Wt 252.0 lb

## 2018-01-05 DIAGNOSIS — B372 Candidiasis of skin and nail: Secondary | ICD-10-CM | POA: Insufficient documentation

## 2018-01-05 DIAGNOSIS — I481 Persistent atrial fibrillation: Secondary | ICD-10-CM

## 2018-01-05 DIAGNOSIS — N951 Menopausal and female climacteric states: Secondary | ICD-10-CM | POA: Insufficient documentation

## 2018-01-05 DIAGNOSIS — Z01818 Encounter for other preprocedural examination: Secondary | ICD-10-CM | POA: Diagnosis not present

## 2018-01-05 DIAGNOSIS — N950 Postmenopausal bleeding: Secondary | ICD-10-CM | POA: Insufficient documentation

## 2018-01-05 DIAGNOSIS — I4819 Other persistent atrial fibrillation: Secondary | ICD-10-CM

## 2018-01-05 SURGERY — MAMMOPLASTY, REDUCTION
Anesthesia: General | Laterality: Right

## 2018-01-05 NOTE — Progress Notes (Signed)
01/05/2018 Cynthia Herrera   03/24/1946  [id number]  Primary Physician Marletta Lor, MD Primary Cardiologist: Dr. Radford Pax   Reason for Visit/CC: Pre-op Clearance  HPI:  Cynthia Herrera is a 72 y.o. female who is being seen today for preoperative evaluation. She has a h/o breast CA s/p left mastectomy and was scheduled to undergo elective breast reconstruction, however procedure was postponed as pt started having difficulties with increased BP and new developments of dizziness. Pt also notes that she was recently found to have a thyroid nodule on imaging and will need to have a FNA biopsy and will also need clearance for that.    From a cardiac standpoint, she has been followed by Dr. Radford Pax. She has a h/o CAD. She was noted to have coronary calcifications on a CT scan but had a cath in 2015 that showed no obstructive CAD. LV gram at that time showed reduced EF at 45%. However systolic function improved. Her last echo 08/2014 showed normal LVEF at 50-55%. Other history includes atrial fibrillation. She has had several cardioversion's in the past but no recent recurrence. She is on chronic anticoagulation therapy with Xarelto. She also has a h/o HTN and DLD.   In addition to her reconstructive breast surgery, she will also need to undergo a thyroid biopsy.  EKG today shows NSR. HR 73 bpm. BP is well controlled at 126/80. She is currently asymptomatic. Her dizzy spells only last a few seconds at a time. Occurs with no particular pattern. Can occur at rest but also if she bends down to pick something up from the floor. Dizzy spells have not lasted more than just a few seconds and there are no associated palpitations, no chest pain, dyspnea, syncope/ near syncope. She had recent labs 01/02/18 which showed normal renal function. She notes her BP was high when she went in for preop assessment. Diastolic BP was initially 110 but improved after a moment of resting. She thinks she was likely anxious.    Pt notes that she can ambulate a flight of stairs w/o anginal symptoms.    Cardiac Studies [facility]  01/2014 Findings:  RA = 8/5 with mean 48mmHg RV = 29/3 with mean 66mmHg PA = 30/12 with mean 52mmHg PCW = 9/6 with mean 27mmHg Fick cardiac output/index = 5.76/2.72 Thermal cardiac output/index = 5.93/2.72 PVR =175 D/S, 372 D/DI FA sat = 93% PA sat = 68% RA sat = 64%  Ao Pressure:124/21mmHg LV Pressure: 124/2mmHg LVEDP: 36mmHg There was no signficant gradient across the aortic valve on pullback.  Left main:calcified but widely patent and bifurcates into an LAD and left circumflex  ZRA:[id number] but widely patent throughout its course to the apex.  It gives rise to a large bifurcating diagonal which is widely patent.  The LAD then gives rise to a second moderate sized diagonal which is patent.    LKT:[id number] but widely patent and traverses the AV groove.  It gives rise to a large OM which bifurcates into 2 daughter branches which are widely patent.   DSK:AJGOTL patent and gives rise to 2 acute RV marginal branches which are patent.  Distally it bifurcates into a PDA and PL branches which are patent.    LV-gram done in the RAO projection: Ejection fraction = 45%  Assessment: 1.  Calcified vessels which are widely patent with no obstructive lesions 2.  Mildly LV dysfunction EF 45% 3.  Normal right heart pressures    2D Echo 08/2014 Study Conclusions  -  Left ventricle: The cavity size was normal. Wall thickness was normal. Systolic function was normal. The estimated ejection fraction was in the range of 50% to 55%. Wall motion was normal; there were no regional wall motion abnormalities. Doppler parameters are consistent with abnormal left ventricular relaxation (grade 1 diastolic dysfunction). - Aortic valve: Valve area (VTI): 2.06 cm^2. Valve area (Vmean): 1.67 cm^2.  Current Meds  Medication Sig  . amoxicillin (AMOXIL) 500 MG capsule Take 2,000 mg  by mouth See admin instructions. Take 2000 mg 1 hour prior to dental appt.  Marland Kitchen atorvastatin (LIPITOR) 40 MG tablet TAKE 1 TABLET (40 MG TOTAL) BY MOUTH AT BEDTIME.  . cephALEXin (KEFLEX) 500 MG capsule Take 500 mg by mouth 4 (four) times daily.  . Cholecalciferol (VITAMIN D3) 2000 units capsule Take 2,000 Units by mouth daily.  . fexofenadine (ALLEGRA) 180 MG tablet Take 180 mg by mouth daily as needed for allergies or rhinitis.  . fluticasone (FLONASE) 50 MCG/ACT nasal spray Place 1 spray into both nostrils daily as needed for allergies.   . furosemide (LASIX) 20 MG tablet Take 1 tablet (20 mg total) by mouth 2 (two) times daily. (Patient taking differently: Take 40 mg by mouth daily. )  . glucose blood test strip Use to check CBG AC and QHS.  Marland Kitchen KLOR-CON M20 20 MEQ tablet Take 1 tablet (20 mEq total) by mouth daily.  Marland Kitchen letrozole (FEMARA) 2.5 MG tablet Take 1 tablet (2.5 mg total) by mouth daily.  Marland Kitchen losartan (COZAAR) 25 MG tablet TAKE 1 TABLET (25 MG TOTAL) BY MOUTH 2 (TWO) TIMES DAILY.  . metFORMIN (GLUCOPHAGE) 500 MG tablet TAKE 1 TABLET TWICE A DAY WITH A MEAL  . nebivolol (BYSTOLIC) 2.5 MG tablet Take 1 tablet (2.5 mg total) by mouth daily.  Marland Kitchen spironolactone (ALDACTONE) 25 MG tablet Take 1 tablet (25 mg total) by mouth daily.  . traMADol (ULTRAM) 50 MG tablet Take 1-2 tablets (50-100 mg total) by mouth every 6 (six) hours as needed.  Alveda Reasons 20 MG TABS tablet Take 20 mg by mouth daily with supper.  Alveda Reasons 20 MG TABS tablet TAKE 1 TABLET EVERY DAY WITH SUPPER   Allergies  Allergen Reactions  . Ambien [Zolpidem] Other (See Comments)    ? Hallucinations, up walking around  . Codeine Phosphate Nausea And Vomiting  . Medrol [Methylprednisolone] Other (See Comments)    Felt really weird w the high dose oral steroid, but tolerates low doses or oral steroid   Past Medical History:  Diagnosis Date  . Arthritis   . Breast cancer, left breast ([place]) 2002   "then treated w/chemo and  radiation"  . Breast cancer, left breast ([place]) 07/14/2017   "tx'd w/mastectomy"  . Chronic back pain    h/o lumbar stenosis; "no problem since my OR" (`10/27/2017)  . Chronic systolic CHF (congestive heart failure) (H[facility])    takes Furosemide and Aldactone daily  . Complication of anesthesia    last surgery iv med when going to sleep"burned as injected"  . COPD (chronic obstructive pulmonary disease) ([facility])    no inhalers   . DCM (dilated cardiomyopathy) ([place])    EF 15-20% ? tachycardia induced - EF 50-55% on echo 2015  . Dyspnea    with exertion  . Dysrhythmia    afib  . Gallstones   . GERD (gastroesophageal reflux disease)    once in a while;depends on what she eats  . History of bronchitis    "chronic when I smoked;  no problem since I quit in 2002" (10/27/2017)  . Hyperlipidemia   . Hypertension    takes Losartan and Metoprolol.   . Joint pain   . Muscle spasm    takes Flexeril daily as needed   . Nocturia   . Osteopenia   . Persistent atrial fibrillation ([place])    s/p TEE DCCV and repeat DCCV 11/14/2013  . Personal history of chemotherapy   . Personal history of colonic polyps    adenomas 03 and 08  . Personal history of radiation therapy   . Pneumonia    hx  . Seasonal allergies   . Thyroid nodule   . Type II diabetes mellitus ([place])   . Vitamin D deficiency    takes Vit D   Family History  Problem Relation Age of Onset  . Dementia Sister   . Diabetes Sister   . Alzheimer's disease Sister   . Diabetes Brother        x 2  . Heart disease Brother   . Irritable bowel syndrome Brother   . Liver cancer Mother   . Diabetes Mother   . Pancreatic cancer Mother   . Diabetes Father   . Heart disease Father    Past Surgical History:  Procedure Laterality Date  . AXILLARY LYMPH NODE DISSECTION Left 10/2001   persistent intramammary node Archie Endo 03/09/2011  . BACK SURGERY    . BREAST BIOPSY Left 2002  . BREAST BIOPSY Right 06/2017   "node bx was negative"  . BREAST  BIOPSY Left 06/2017   "positive for cancer"  . BREAST LUMPECTOMY WITH RADIOACTIVE SEED LOCALIZATION Right 08/15/2017   Procedure: RIGHT BREAST LUMPECTOMY WITH RADIOACTIVE SEED LOCALIZATION;  Surgeon: Jovita Kussmaul, MD;  Location: [place];  Service: General;  Laterality: Right;  . CARDIAC CATHETERIZATION  2015  . CARDIOVERSION N/A 11/12/2013   Procedure: CARDIOVERSION;  Surgeon: Thayer Headings, MD;  Location: [facility] ENDOSCOPY;  Service: Cardiovascular;  Laterality: N/A;  10:08  Dr. Marissa Nestle, anesthesia present, [place]   60mg ,  propofol 50mg , IV for elective cardioversion....Dr. Cathie Olden delievered synch 120 joules with successful cardioversion to NSR  . CARDIOVERSION N/A 11/12/2013   Procedure: CARDIOVERSION;  Surgeon: Thayer Headings, MD;  Location: [place];  Service: Cardiovascular;  Laterality: N/A;  . CARDIOVERSION N/A 11/14/2013   Procedure: CARDIOVERSION (BEDSIDE);  Surgeon: Larey Dresser, MD;  Location: [place];  Service: Cardiovascular;  Laterality: N/A;  . CATARACT EXTRACTION W/ INTRAOCULAR LENS  IMPLANT, BILATERAL Bilateral ~ 2016  . COLONOSCOPY  2003, September 2008, April 01, 2011   adenomas 03 and 08, polyp 12, diverticulosis  . COLONOSCOPY WITH PROPOFOL N/A 11/16/2016   Procedure: COLONOSCOPY WITH PROPOFOL;  Surgeon: Gatha Mayer, MD;  Location: [facility] ENDOSCOPY;  Service: Endoscopy;  Laterality: N/A;  . JOINT REPLACEMENT    . LAPAROSCOPIC CHOLECYSTECTOMY  2004  . LEFT AND RIGHT HEART CATHETERIZATION WITH CORONARY ANGIOGRAM N/A 02/01/2014   Procedure: LEFT AND RIGHT HEART CATHETERIZATION WITH CORONARY ANGIOGRAM;  Surgeon: Sueanne Margarita, MD;  Location: [place] CATH LAB;  Service: Cardiovascular;  Laterality: N/A;  . LUMBAR LAMINECTOMY/DECOMPRESSION MICRODISCECTOMY N/A 03/19/2014   Procedure: LUMBAR LAMINECTOMY/DECOMPRESSION MICRODISCECTOMY 4 LEVEL;  Surgeon: Kristeen Miss, MD;  Location: [place] NEURO ORS;  Service: Neurosurgery;  Laterality: N/A;  L1-2 L2-3 L3-4 L4-5 Laminectomy/Foraminotomy  . MASS  EXCISION Left 08/15/2017   Procedure: EXCISION OF LEFT BREAST MASS;  Surgeon: Jovita Kussmaul, MD;  Location: [place];  Service: General;  Laterality: Left;  Marland Kitchen MASTECTOMY  Left 10/27/2017  . MASTECTOMY PARTIAL / LUMPECTOMY W/ AXILLARY LYMPHADENECTOMY  05/08/2001   Archie Endo 03/09/2011  . PORT-A-CATH REMOVAL  2003  . PORTA CATH INSERTION  2002  . TEE WITHOUT CARDIOVERSION N/A 11/12/2013   Procedure: TRANSESOPHAGEAL ECHOCARDIOGRAM (TEE);  Surgeon: Thayer Headings, MD;  Location: [place];  Service: Cardiovascular;  Laterality: N/A;  pt b/p low, pt buccal membranes very dry, lips scapped, pt c/o thirst. NPO since MN and iv FLUIDS TOTAL INFUSING AT TOTAL 20ML HR....  Dr. Cathie Olden order allow NS to bolus during procedure....very dry,NS bolus 250 ml total..pt responding well to meds..  . TOTAL KNEE ARTHROPLASTY Left 2006  . TOTAL KNEE ARTHROPLASTY Right 05/10/2016   Procedure: RIGHT TOTAL KNEE ARTHROPLASTY;  Surgeon: Gaynelle Arabian, MD;  Location: [facility] ORS;  Service: Orthopedics;  Laterality: Right;  With adductor block  . TOTAL MASTECTOMY Left 10/27/2017   Procedure: LEFT MASTECTOMY;  Surgeon: Jovita Kussmaul, MD;  Location: [place];  Service: General;  Laterality: Left;  . TUBAL LIGATION     Social History   Socioeconomic History  . Marital status: Widowed    Spouse name: Not on file  . Number of children: 2  . Years of education: Not on file  . Highest education level: Not on file  Social Needs  . Financial resource strain: Not on file  . Food insecurity - worry: Not on file  . Food insecurity - inability: Not on file  . Transportation needs - medical: Not on file  . Transportation needs - non-medical: Not on file  Occupational History  . Occupation: retired  Tobacco Use  . Smoking status: Former Smoker    Packs/day: 1.50    Years: 35.00    Pack years: 52.50    Types: Cigarettes    Last attempt to quit: 08/24/2001    Years since quitting: 16.3  . Smokeless tobacco: Never Used  Substance and  Sexual Activity  . Alcohol use: No  . Drug use: No  . Sexual activity: No    Birth control/protection: Post-menopausal  Other Topics Concern  . Not on file  Social History Narrative  . Not on file     Review of Systems: General: negative for chills, fever, night sweats or weight changes.  Cardiovascular: negative for chest pain, dyspnea on exertion, edema, orthopnea, palpitations, paroxysmal nocturnal dyspnea or shortness of breath Dermatological: negative for rash Respiratory: negative for cough or wheezing Urologic: negative for hematuria Abdominal: negative for nausea, vomiting, diarrhea, bright red blood per rectum, melena, or hematemesis Neurologic: negative for visual changes, syncope, or dizziness All other systems reviewed and are otherwise negative except as noted above.   Physical Exam:  Blood pressure 126/80, pulse 73, height 5\' 6"  (1.676 m), weight 252 lb (114.3 kg).  General appearance: alert, cooperative and no distress Neck: no carotid bruit and no JVD Lungs: clear to auscultation bilaterally Heart: regular rate and rhythm, S1, S2 normal, no murmur, click, rub or gallop Extremities: extremities normal, atraumatic, no cyanosis or edema Pulses: 2+ and symmetric Skin: Skin color, texture, turgor normal. No rashes or lesions Neurologic: Grossly normal  EKG NSR 73 bpm -- personally reviewed   ASSESSMENT AND PLAN:   1. Coronary Artery Calcifications: noted on CT scan however subsequent LHC in 2015 showed no angiographic evidence of obstructive disease. Coronaries were noted to be widely patent. She denies anginal symptoms. She is able to ambulate a flight of stairs (4 METs of physical activity), w/o ischemic symptoms. No exertional CP. EKG  nonischemic. Continue medical therapy/ risk factor modification.   2. PAF: NSR on EKG. On Xarelto for stroke prophylaxis. She denies h/o stroke TIA. No h/o DVT/ PE. It would be ok to hold for procedures. Pharmacy will review chart in  pre-op and will give recs regarding how long Xarelto can be held.   3. Pre-op Evaluation: physical exam is benign. No murmurs and no carotid bruits noted on edam. EKG nonischemic. No anginal symptoms. She can ambulate a flight of stairs (4 METs of physical activity) w/o ischemic symptoms. No indication for further cardiac testing prior to noncardiac surgery. She can be cleared and would be of acceptable risk from a cardiac standpoint.   4. Dizziness: BP stable. No murmurs or carotid bruits. EKG normal. HR normal. Dizzy spells have not lasted more than just a few seconds and there are no associated palpitations, no chest pain, dyspnea, syncope/ near syncope. Symptoms sometimes reproduced if she bends over. I don't think this is cardiac related. No further w/u recommended. Pt reassured.   5. Thyroid Nodule: FNA biopsy pending.   6. Left Sided Breast Cancer: s/p left mastectomy. Pt scheduled to undergo left breast reconstruction surgery. Ok to proceed from a cardiac standpoint.    Follow-Up: continue routine f/u with Dr. Radford Pax. She has f/u with her in a few months.   Cynthia Herrera, MHS [facility] 01/05/2018 12:56 PM

## 2018-01-05 NOTE — Telephone Encounter (Signed)
Patient with diagnosis of Afib on Xarelto for anticoagulation.    Procedure: thyroid biopsy Date of procedure: TBD  CHADS2-VASc score of  6 (CHF, HTN, AGE, DM2, stroke/tia x 2, CAD, AGE, female)  CrCl 33ml/min  Per office protocol, patient can hold Xarelto for 24 hours prior to procedure.

## 2018-01-05 NOTE — Patient Instructions (Addendum)
Medication Instructions:  Your physician recommends that you continue on your current medications as directed. Please refer to the Current Medication list given to you today.  Labwork: NONE  Testing/Procedures: NONE  Follow-Up: As previously directed.  If you need a refill on your cardiac medications before your next appointment, please call your pharmacy.

## 2018-01-05 NOTE — Telephone Encounter (Signed)
   Pt was seen today in clinic for preop evaluation. She was doing well. No acute cardiac issues/ symptoms. She can be cleared for procedure w/o any further cardiac w/u.   Per office protocol, patient can hold Xarelto for 24 hours prior to procedure.    I will route to requesting MD and will remove from preop pool.

## 2018-01-16 ENCOUNTER — Other Ambulatory Visit: Payer: Self-pay | Admitting: Cardiology

## 2018-01-19 ENCOUNTER — Ambulatory Visit
Admission: RE | Admit: 2018-01-19 | Discharge: 2018-01-19 | Disposition: A | Discharge status: Home / Self Care | Payer: Medicare Other | Source: Ambulatory Visit | Attending: Otolaryngology | Admitting: Otolaryngology

## 2018-01-19 ENCOUNTER — Other Ambulatory Visit (HOSPITAL_COMMUNITY)
Admission: RE | Admit: 2018-01-19 | Discharge: 2018-01-19 | Disposition: A | Discharge status: Home / Self Care | Payer: Medicare Other | Source: Ambulatory Visit | Attending: Radiology | Admitting: Radiology

## 2018-01-19 DIAGNOSIS — E041 Nontoxic single thyroid nodule: Secondary | ICD-10-CM

## 2018-01-31 DIAGNOSIS — E041 Nontoxic single thyroid nodule: Secondary | ICD-10-CM | POA: Insufficient documentation

## 2018-02-01 DIAGNOSIS — Z853 Personal history of malignant neoplasm of breast: Secondary | ICD-10-CM | POA: Diagnosis not present

## 2018-02-01 DIAGNOSIS — Z01419 Encounter for gynecological examination (general) (routine) without abnormal findings: Secondary | ICD-10-CM | POA: Diagnosis not present

## 2018-02-07 ENCOUNTER — Telehealth: Payer: Self-pay | Admitting: Hematology and Oncology

## 2018-02-07 ENCOUNTER — Inpatient Hospital Stay: Payer: Medicare Other | Attending: Hematology and Oncology | Admitting: Hematology and Oncology

## 2018-02-07 DIAGNOSIS — Z170 Estrogen receptor positive status [ER+]: Secondary | ICD-10-CM | POA: Insufficient documentation

## 2018-02-07 DIAGNOSIS — Z79899 Other long term (current) drug therapy: Secondary | ICD-10-CM | POA: Diagnosis not present

## 2018-02-07 DIAGNOSIS — R0602 Shortness of breath: Secondary | ICD-10-CM | POA: Diagnosis not present

## 2018-02-07 DIAGNOSIS — R911 Solitary pulmonary nodule: Secondary | ICD-10-CM | POA: Diagnosis not present

## 2018-02-07 DIAGNOSIS — Z7901 Long term (current) use of anticoagulants: Secondary | ICD-10-CM | POA: Insufficient documentation

## 2018-02-07 DIAGNOSIS — Z79811 Long term (current) use of aromatase inhibitors: Secondary | ICD-10-CM | POA: Diagnosis not present

## 2018-02-07 DIAGNOSIS — Z7984 Long term (current) use of oral hypoglycemic drugs: Secondary | ICD-10-CM | POA: Insufficient documentation

## 2018-02-07 DIAGNOSIS — Z9012 Acquired absence of left breast and nipple: Secondary | ICD-10-CM | POA: Insufficient documentation

## 2018-02-07 DIAGNOSIS — Z9221 Personal history of antineoplastic chemotherapy: Secondary | ICD-10-CM | POA: Diagnosis not present

## 2018-02-07 DIAGNOSIS — C50112 Malignant neoplasm of central portion of left female breast: Secondary | ICD-10-CM | POA: Insufficient documentation

## 2018-02-07 NOTE — Assessment & Plan Note (Signed)
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 Nonspecific pulmonary nodule left upper lobe 3 mm  Oncotype testing could not be performed because it was felt that the tumor was a recurrent cancer and Oncotype DX has no ability in this situation.   Recommendation: adjuvant antiestrogen therapy with letrozole 2.5 mg daily started 11/14/2017 (I offered her systemic chemotherapy but patient declined)  Letrozole toxicities:  Return to clinic in 6 months for follow-up

## 2018-02-07 NOTE — Telephone Encounter (Signed)
Gave avs and calendar ° °

## 2018-02-07 NOTE — Progress Notes (Signed)
Patient Care Team: Marletta Lor, MD as PCP - General  DIAGNOSIS:  Encounter Diagnosis  Name Primary?  . Malignant neoplasm of central portion of left breast in female, estrogen receptor positive ([place])     SUMMARY OF ONCOLOGIC HISTORY:   Malignant neoplasm of central portion of left breast in female, estrogen receptor positive ([place])   2002 Initial Diagnosis    Left breast cancer treated with lumpectomy followed by adjuvant chemotherapy (does not remember chemotherapy drugs) and radiation.  Did not take antiestrogen therapy      07/11/2017 Relapse/Recurrence    Left breast skin papule; biopsy proven to be invasive mammary carcinoma ER 100% PR 90% positive HER-2 negative ratio 1.64; Right breast 1.5 cm indeterminate calcifications biopsy proven to be complex sclerosing lesion      08/15/2017 Surgery    Left breast excision: Invasive carcinoma involving the dermis, ER 100%, PR 90%, HER-2 negative ratio 1.64.  Right lumpectomy: Complex sclerosing lesion       10/27/2017 Surgery    Left simple mastectomy: Recurrent multifocal IDC grade 2 largest spanning 3.5 cm, low to high-grade DCIS, tumor involves nipple and epidermis, margins negative, left chest wall excision IDC involving the anterior margin ER 100%, PR 90%, HER-2 negative; T4 Nx stage 3b      11/14/2017 -  Anti-estrogen oral therapy    Letrozole 2.5 mg daily       CHIEF COMPLIANT: Follow-up on letrozole therapy  INTERVAL HISTORY: Cynthia Herrera is a 72 year old with above-mentioned history of recurrent left breast cancer.  Previously she had 2002 breast cancer and then recurred in January 2019.  She underwent left mastectomy and is currently on oral antiestrogen therapy with letrozole.  She has significant fatigue initially which got better.  She however feels short of breath and some chest tightness.  She is planning to see her cardiologist in 10 days.  REVIEW OF SYSTEMS:   Constitutional: Denies fevers, chills or  abnormal weight loss Eyes: Denies blurriness of vision Ears, nose, mouth, throat, and face: Denies mucositis or sore throat Respiratory: Denies cough, dyspnea or wheezes Cardiovascular: Denies palpitation, chest discomfort Gastrointestinal:  Denies nausea, heartburn or change in bowel habits Skin: Denies abnormal skin rashes Lymphatics: Denies new lymphadenopathy or easy bruising Neurological:Denies numbness, tingling or new weaknesses Behavioral/Psych: Mood is stable, no new changes  Extremities: No lower extremity edema Breast:  denies any pain or lumps or nodules in either breasts All other systems were reviewed with the patient and are negative.  I have reviewed the past medical history, past surgical history, social history and family history with the patient and they are unchanged from previous note.  ALLERGIES:  is allergic to ambien [zolpidem]; codeine phosphate; and medrol [methylprednisolone].  MEDICATIONS:  Current Outpatient Medications  Medication Sig Dispense Refill  . amoxicillin (AMOXIL) 500 MG capsule Take 2,000 mg by mouth See admin instructions. Take 2000 mg 1 hour prior to dental appt.  5  . atorvastatin (LIPITOR) 40 MG tablet TAKE 1 TABLET (40 MG TOTAL) BY MOUTH AT BEDTIME. 90 tablet 3  . cephALEXin (KEFLEX) 500 MG capsule Take 500 mg by mouth 4 (four) times daily.    . Cholecalciferol (VITAMIN D3) 2000 units capsule Take 2,000 Units by mouth daily.    . fexofenadine (ALLEGRA) 180 MG tablet Take 180 mg by mouth daily as needed for allergies or rhinitis.    . fluticasone (FLONASE) 50 MCG/ACT nasal spray Place 1 spray into both nostrils daily as needed for allergies.     Marland Kitchen  furosemide (LASIX) 20 MG tablet Take 1 tablet (20 mg total) by mouth 2 (two) times daily. (Patient taking differently: Take 40 mg by mouth daily. ) 180 tablet 3  . glucose blood test strip Use to check CBG AC and QHS. 200 each 12  . KLOR-CON M20 20 MEQ tablet Take 1 tablet (20 mEq total) by mouth  daily. 90 tablet 3  . letrozole (FEMARA) 2.5 MG tablet Take 1 tablet (2.5 mg total) by mouth daily. 90 tablet 3  . losartan (COZAAR) 25 MG tablet TAKE 1 TABLET BY MOUTH TWICE A DAY 180 tablet 3  . metFORMIN (GLUCOPHAGE) 500 MG tablet TAKE 1 TABLET TWICE A DAY WITH A MEAL 180 tablet 1  . nebivolol (BYSTOLIC) 2.5 MG tablet Take 1 tablet (2.5 mg total) by mouth daily. 30 tablet 6  . spironolactone (ALDACTONE) 25 MG tablet Take 1 tablet (25 mg total) by mouth daily. 90 tablet 3  . traMADol (ULTRAM) 50 MG tablet Take 1-2 tablets (50-100 mg total) by mouth every 6 (six) hours as needed. 20 tablet 0  . XARELTO 20 MG TABS tablet TAKE 1 TABLET EVERY DAY WITH SUPPER 30 tablet 6   No current facility-administered medications for this visit.     PHYSICAL EXAMINATION: ECOG PERFORMANCE STATUS: 1 - Symptomatic but completely ambulatory  Vitals:   02/07/18 1521  BP: 105/62  Pulse: 86  Resp: 18  Temp: (!) 97.5 F (36.4 C)  SpO2: 97%   Filed Weights   02/07/18 1521  Weight: 248 lb 1.6 oz (112.5 kg)    GENERAL:alert, no distress and comfortable SKIN: skin color, texture, turgor are normal, no rashes or significant lesions EYES: normal, Conjunctiva are pink and non-injected, sclera clear OROPHARYNX:no exudate, no erythema and lips, buccal mucosa, and tongue normal  NECK: supple, thyroid normal size, non-tender, without nodularity LYMPH:  no palpable lymphadenopathy in the cervical, axillary or inguinal LUNGS: clear to auscultation and percussion with normal breathing effort HEART: regular rate & rhythm and no murmurs and no lower extremity edema ABDOMEN:abdomen soft, non-tender and normal bowel sounds MUSCULOSKELETAL:no cyanosis of digits and no clubbing  NEURO: alert & oriented x 3 with fluent speech, no focal motor/sensory deficits EXTREMITIES: No lower extremity edema BREAST: No palpable masses or nodules in either right or left breasts. No palpable axillary supraclavicular or infraclavicular  adenopathy no breast tenderness or nipple discharge. (exam performed in the presence of a chaperone)  LABORATORY DATA:  I have reviewed the data as listed CMP Latest Ref Rng & Units 01/02/2018 10/27/2017 09/29/2017  Glucose 65 - 99 mg/dL 210(H) 193(H) 217(H)  BUN 6 - 20 mg/dL 17 23(H) 21(H)  Creatinine 0.44 - 1.00 mg/dL 1.06(H) 1.02(H) 1.35(H)  Sodium 135 - 145 mmol/L 138 136 136  Potassium 3.5 - 5.1 mmol/L 3.6 3.5 3.7  Chloride 101 - 111 mmol/L 105 104 102  CO2 22 - 32 mmol/L '22 23 24  '$ Calcium 8.9 - 10.3 mg/dL 9.8 9.2 9.2  Total Protein 6.0 - 8.3 g/dL - - -  Total Bilirubin 0.2 - 1.2 mg/dL - - -  Alkaline Phos 39 - 117 U/L - - -  AST 0 - 37 U/L - - -  ALT 0 - 35 U/L - - -    Lab Results  Component Value Date   WBC 9.1 01/02/2018   HGB 12.5 01/02/2018   HCT 37.8 01/02/2018   MCV 90.2 01/02/2018   PLT 335 01/02/2018   NEUTROABS 7.5 02/03/2017    ASSESSMENT &  PLAN:  Malignant neoplasm of central portion of left breast in female, estrogen receptor positive ([place]) 10/27/2017: Left simple mastectomy: Recurrent multifocal IDC grade 2 largest spanning 3.5 cm, low to high-grade DCIS, tumor involves nipple and epidermis, margins negative, left chest wall excision IDC involving the anterior margin ER 100%, PR 90%, HER-2 negative; T4 Nx stage 3b (2002: Left breast cancer treated with lumpectomy followed by adjuvant chemo and radiation, did not take antiestrogen therapy)  CT chest abdomen pelvis 09/07/2017: No specific evidence of metastases. Nonspecific pulmonary nodule left upper lobe 3 mm  Oncotype testing could not be performed because it was felt that the tumor was a recurrent cancer and Oncotype DX has no ability in this situation.   Recommendation: adjuvant antiestrogen therapy with letrozole 2.5 mg daily started 11/14/2017 (I offered her systemic chemotherapy but patient declined)  Letrozole toxicities: 1.  Fatigue  2. shortness of breath: I discussed with the patient to  discontinue letrozole therapy and see if her symptoms improve.  If her symptoms do improve then we can send for anastrozole.  If her symptoms do not get better then she will resume letrozole. Patient will call us to inform us about how she feels after discontinuing letrozole therapy for 2 weeks.  Return to clinic in 6 months for follow-up   No orders of the defined types were placed in this encounter.  The patient has a good understanding of the overall plan. she agrees with it. she will call with any problems that may develop before the next visit here.   Harriette Ohara, MD 02/07/18

## 2018-02-20 ENCOUNTER — Ambulatory Visit: Payer: Medicare Other | Admitting: Cardiology

## 2018-02-20 VITALS — BP 126/64 | HR 84 | Ht 66.0 in | Wt 250.0 lb

## 2018-02-20 DIAGNOSIS — I420 Dilated cardiomyopathy: Secondary | ICD-10-CM | POA: Diagnosis not present

## 2018-02-20 DIAGNOSIS — E785 Hyperlipidemia, unspecified: Secondary | ICD-10-CM

## 2018-02-20 DIAGNOSIS — I10 Essential (primary) hypertension: Secondary | ICD-10-CM

## 2018-02-20 DIAGNOSIS — R0602 Shortness of breath: Secondary | ICD-10-CM | POA: Diagnosis not present

## 2018-02-20 DIAGNOSIS — I481 Persistent atrial fibrillation: Secondary | ICD-10-CM | POA: Diagnosis not present

## 2018-02-20 DIAGNOSIS — I5042 Chronic combined systolic (congestive) and diastolic (congestive) heart failure: Secondary | ICD-10-CM | POA: Diagnosis not present

## 2018-02-20 DIAGNOSIS — I2510 Atherosclerotic heart disease of native coronary artery without angina pectoris: Secondary | ICD-10-CM | POA: Diagnosis not present

## 2018-02-20 DIAGNOSIS — I4819 Other persistent atrial fibrillation: Secondary | ICD-10-CM

## 2018-02-20 LAB — BASIC METABOLIC PANEL
BUN / CREAT RATIO: 14 (ref 12–28)
BUN: 15 mg/dL (ref 8–27)
CHLORIDE: 103 mmol/L (ref 96–106)
CO2: 23 mmol/L (ref 20–29)
Calcium: 9.8 mg/dL (ref 8.7–10.3)
Creatinine, Ser: 1.1 mg/dL — ABNORMAL HIGH (ref 0.57–1.00)
GFR calc non Af Amer: 50 mL/min/{1.73_m2} — ABNORMAL LOW (ref >59)
GFR, EST AFRICAN AMERICAN: 58 mL/min/{1.73_m2} — AB (ref >59)
GLUCOSE: 149 mg/dL — AB (ref 65–99)
POTASSIUM: 4.4 mmol/L (ref 3.5–5.2)
Sodium: 141 mmol/L (ref 134–144)

## 2018-02-20 MED ORDER — NEBIVOLOL HCL 5 MG PO TABS: 5.0000 mg | ORAL_TABLET | Freq: Every day | ORAL | 11 refills | Status: DC

## 2018-02-20 NOTE — Progress Notes (Addendum)
Cardiology Office Note:    Date:  02/20/2018   ID:  Cynthia Herrera, DOB 12-14-1945, MRN [id number]  PCP:  Marletta Lor, MD  Cardiologist:  No primary care provider on file.    Referring MD: Marletta Lor, MD   Chief Complaint  Patient presents with  . Atrial Fibrillation  . Hypertension    History of Present Illness:    Cynthia Herrera is a 72 y.o. female with a hx of ASCAD (coronary artery calcifications on CT scan and on cath with no obstructive lesions), nonischemic dilated CM EF now 50-55%, HTN, dyslipidemia, chronic anticoagulation, chronic systolic CHF and persistent atrial fibrillation s/p TEE/DCCV to NSR but failed to hold NSR. She was again cardioverted on 11/14/16 after more loading with amio but failed to convert. Later that day she spontaneously converted to NSR. She stopped amio due to severe dizziness. She has been maintaining NSR.  She is here today for followup and is doing well.  She denies any chest pain or pressure, SOB, DOE, PND, orthopnea, dizziness, palpitations or syncope. She is compliant with her meds and is tolerating meds with no SE.  She did have some SOB after starting the chemo drug for her breast CA but the med was held and sx improved somewhat.  The SOB now seems to have resolved.  She occasionally has some mild LE edema.     Past Medical History:  Diagnosis Date  . Arthritis   . Breast cancer, left breast ([place]) 2002   "then treated w/chemo and radiation"  . Breast cancer, left breast ([place]) 07/14/2017   "tx'd w/mastectomy"  . Chronic back pain    h/o lumbar stenosis; "no problem since my OR" (`10/27/2017)  . Chronic systolic CHF (congestive heart failure) ([facility])    takes Furosemide and Aldactone daily  . Complication of anesthesia    last surgery iv med when going to sleep"burned as injected"  . COPD (chronic obstructive pulmonary disease) ([facility])    no inhalers   . DCM (dilated cardiomyopathy) ([place])    EF 15-20% ? tachycardia  induced - EF 50-55% on echo 2015  . Dyspnea    with exertion  . Dysrhythmia    afib  . Gallstones   . GERD (gastroesophageal reflux disease)    once in a while;depends on what she eats  . History of bronchitis    "chronic when I smoked; no problem since I quit in 2002" (10/27/2017)  . Hyperlipidemia   . Hypertension    takes Losartan and Metoprolol.   . Joint pain   . Muscle spasm    takes Flexeril daily as needed   . Nocturia   . Osteopenia   . Persistent atrial fibrillation ([place])    s/p TEE DCCV and repeat DCCV 11/14/2013  . Personal history of chemotherapy   . Personal history of colonic polyps    adenomas 03 and 08  . Personal history of radiation therapy   . Pneumonia    hx  . Seasonal allergies   . Thyroid nodule   . Type II diabetes mellitus ([place])   . Vitamin D deficiency    takes Vit D    Past Surgical History:  Procedure Laterality Date  . AXILLARY LYMPH NODE DISSECTION Left 10/2001   persistent intramammary node Archie Endo 03/09/2011  . BACK SURGERY    . BREAST BIOPSY Left 2002  . BREAST BIOPSY Right 06/2017   "node bx was negative"  . BREAST BIOPSY Left 06/2017   "  positive for cancer"  . BREAST LUMPECTOMY WITH RADIOACTIVE SEED LOCALIZATION Right 08/15/2017   Procedure: RIGHT BREAST LUMPECTOMY WITH RADIOACTIVE SEED LOCALIZATION;  Surgeon: Jovita Kussmaul, MD;  Location: [facility];  Service: General;  Laterality: Right;  . CARDIAC CATHETERIZATION  2015  . CARDIOVERSION N/A 11/12/2013   Procedure: CARDIOVERSION;  Surgeon: Thayer Headings, MD;  Location[facility] ENDOSCOPY;  Service: Cardiovascular;  Laterality: N/A;  10:08  Dr. Marissa Nestle, anesthesia present, [place]   60mg ,  propofol 50mg , IV for elective cardioversion....Dr. Cathie Olden delievered synch 120 joules with successful cardioversion to NSR  . CARDIOVERSION N/A 11/12/2013   Procedure: CARDIOVERSION;  Surgeon: Thayer Headings, MD;  Location: [facility];  Service: Cardiovascular;  Laterality: N/A;  . CARDIOVERSION N/A  11/14/2013   Procedure: CARDIOVERSION (BEDSIDE);  Surgeon: Larey Dresser, MD;  Location: [facility];  Service: Cardiovascular;  Laterality: N/A;  . CATARACT EXTRACTION W/ INTRAOCULAR LENS  IMPLANT, BILATERAL Bilateral ~ 2016  . COLONOSCOPY  2003, September 2008, April 01, 2011   adenomas 03 and 08, polyp 12, diverticulosis  . COLONOSCOPY WITH PROPOFOL N/A 11/16/2016   Procedure: COLONOSCOPY WITH PROPOFOL;  Surgeon: Gatha Mayer, MD;  Location: [facility] ENDOSCOPY;  Service: Endoscopy;  Laterality: N/A;  . JOINT REPLACEMENT    . LAPAROSCOPIC CHOLECYSTECTOMY  2004  . LEFT AND RIGHT HEART CATHETERIZATION WITH CORONARY ANGIOGRAM N/A 02/01/2014   Procedure: LEFT AND RIGHT HEART CATHETERIZATION WITH CORONARY ANGIOGRAM;  Surgeon: Sueanne Margarita, MD;  Location: [facility] CATH LAB;  Service: Cardiovascular;  Laterality: N/A;  . LUMBAR LAMINECTOMY/DECOMPRESSION MICRODISCECTOMY N/A 03/19/2014   Procedure: LUMBAR LAMINECTOMY/DECOMPRESSION MICRODISCECTOMY 4 LEVEL;  Surgeon: Kristeen Miss, MD;  Location: [facility] NEURO ORS;  Service: Neurosurgery;  Laterality: N/A;  L1-2 L2-3 L3-4 L4-5 Laminectomy/Foraminotomy  . MASS EXCISION Left 08/15/2017   Procedure: EXCISION OF LEFT BREAST MASS;  Surgeon: Jovita Kussmaul, MD;  Location: [facility];  Service: General;  Laterality: Left;  Marland Kitchen MASTECTOMY Left 10/27/2017  . MASTECTOMY PARTIAL / LUMPECTOMY W/ AXILLARY LYMPHADENECTOMY  05/08/2001   Archie Endo 03/09/2011  . PORT-A-CATH REMOVAL  2003  . PORTA CATH INSERTION  2002  . TEE WITHOUT CARDIOVERSION N/A 11/12/2013   Procedure: TRANSESOPHAGEAL ECHOCARDIOGRAM (TEE);  Surgeon: Thayer Headings, MD;  Location: [facility];  Service: Cardiovascular;  Laterality: N/A;  pt b/p low, pt buccal membranes very dry, lips scapped, pt c/o thirst. NPO since MN and iv FLUIDS TOTAL INFUSING AT TOTAL 20ML HR....  Dr. Cathie Olden order allow NS to bolus during procedure....very dry,NS bolus 250 ml total..pt responding well to meds..  . TOTAL KNEE ARTHROPLASTY Left 2006  . TOTAL  KNEE ARTHROPLASTY Right 05/10/2016   Procedure: RIGHT TOTAL KNEE ARTHROPLASTY;  Surgeon: Gaynelle Arabian, MD;  Location: [facility] ORS;  Service: Orthopedics;  Laterality: Right;  With adductor block  . TOTAL MASTECTOMY Left 10/27/2017   Procedure: LEFT MASTECTOMY;  Surgeon: Jovita Kussmaul, MD;  Location: [facility];  Service: General;  Laterality: Left;  . TUBAL LIGATION      Current Medications: Current Meds  Medication Sig  . amoxicillin (AMOXIL) 500 MG capsule Take 2,000 mg by mouth See admin instructions. Take 2000 mg 1 hour prior to dental appt.  Marland Kitchen atorvastatin (LIPITOR) 40 MG tablet TAKE 1 TABLET (40 MG TOTAL) BY MOUTH AT BEDTIME.  . cephALEXin (KEFLEX) 500 MG capsule Take 500 mg by mouth 4 (four) times daily.  . Cholecalciferol (VITAMIN D3) 2000 units capsule Take 2,000 Units by mouth daily.  . fexofenadine (ALLEGRA) 180 MG tablet Take  180 mg by mouth daily as needed for allergies or rhinitis.  . fluticasone (FLONASE) 50 MCG/ACT nasal spray Place 1 spray into both nostrils daily as needed for allergies.   . furosemide (LASIX) 20 MG tablet Take 1 tablet (20 mg total) by mouth 2 (two) times daily. (Patient taking differently: Take 40 mg by mouth daily. )  . glucose blood test strip Use to check CBG AC and QHS.  Marland Kitchen KLOR-CON M20 20 MEQ tablet Take 1 tablet (20 mEq total) by mouth daily.  Marland Kitchen letrozole (FEMARA) 2.5 MG tablet Take 1 tablet (2.5 mg total) by mouth daily.  Marland Kitchen losartan (COZAAR) 25 MG tablet TAKE 1 TABLET BY MOUTH TWICE A DAY  . metFORMIN (GLUCOPHAGE) 500 MG tablet TAKE 1 TABLET TWICE A DAY WITH A MEAL  . nebivolol (BYSTOLIC) 2.5 MG tablet Take 1 tablet (2.5 mg total) by mouth daily.  Marland Kitchen spironolactone (ALDACTONE) 25 MG tablet Take 1 tablet (25 mg total) by mouth daily.  . traMADol (ULTRAM) 50 MG tablet Take 1-2 tablets (50-100 mg total) by mouth every 6 (six) hours as needed.  Alveda Reasons 20 MG TABS tablet TAKE 1 TABLET EVERY DAY WITH SUPPER     Allergies:   Ambien [zolpidem]; Codeine phosphate;  and Medrol [methylprednisolone]   Social History   Socioeconomic History  . Marital status: Widowed    Spouse name: Not on file  . Number of children: 2  . Years of education: Not on file  . Highest education level: Not on file  Occupational History  . Occupation: retired  Scientific laboratory technician  . Financial resource strain: Not on file  . Food insecurity:    Worry: Not on file    Inability: Not on file  . Transportation needs:    Medical: Not on file    Non-medical: Not on file  Tobacco Use  . Smoking status: Former Smoker    Packs/day: 1.50    Years: 35.00    Pack years: 52.50    Types: Cigarettes    Last attempt to quit: 08/24/2001    Years since quitting: 16.5  . Smokeless tobacco: Never Used  Substance and Sexual Activity  . Alcohol use: No  . Drug use: No  . Sexual activity: Never    Birth control/protection: Post-menopausal  Lifestyle  . Physical activity:    Days per week: Not on file    Minutes per session: Not on file  . Stress: Not on file  Relationships  . Social connections:    Talks on phone: Not on file    Gets together: Not on file    Attends religious service: Not on file    Active member of club or organization: Not on file    Attends meetings of clubs or organizations: Not on file    Relationship status: Not on file  Other Topics Concern  . Not on file  Social History Narrative  . Not on file     Family History: The patient's She family history includes Alzheimer's disease in her sister; Dementia in her sister; Diabetes in her brother, father, mother, and sister; Heart disease in her brother and father; Irritable bowel syndrome in her brother; Liver cancer in her mother; Pancreatic cancer in her mother.  ROS:   Please see the history of present illness.    ROS  All other systems reviewed and negative.   EKGs/Labs/Other Studies Reviewed:    The following studies were reviewed today: none  EKG:  EKG is ordered today  and showed atrial  fibrillation with RVR at 104bpm  Recent Labs: 01/02/2018: BUN 17; Creatinine, Ser 1.06; Hemoglobin 12.5; Platelets 335; Potassium 3.6; Sodium 138   Recent Lipid Panel    Component Value Date/Time   CHOL 119 02/03/2017 1207   TRIG 115.0 02/03/2017 1207   HDL 42.60 02/03/2017 1207   CHOLHDL 3 02/03/2017 1207   VLDL 23.0 02/03/2017 1207   LDLCALC 54 02/03/2017 1207    Physical Exam:    VS:  BP 126/64   Pulse 84   Ht 5\' 6"  (1.676 m)   Wt 250 lb (113.4 kg)   BMI 40.35 kg/m     Wt Readings from Last 3 Encounters:  02/20/18 250 lb (113.4 kg)  02/07/18 248 lb 1.6 oz (112.5 kg)  01/05/18 252 lb (114.3 kg)     GEN:  Well nourished, well developed in no acute distress HEENT: Normal NECK: No JVD; No carotid bruits LYMPHATICS: No lymphadenopathy CARDIAC: RRR, no murmurs, rubs, gallops RESPIRATORY:  Clear to auscultation without rales, wheezing or rhonchi  ABDOMEN: Soft, non-tender, non-distended MUSCULOSKELETAL:  No edema; No deformity  SKIN: Warm and dry NEUROLOGIC:  Alert and oriented x 3 PSYCHIATRIC:  Normal affect   ASSESSMENT:    1. Coronary artery disease involving native coronary artery of native heart without angina pectoris   2. Essential hypertension   3. Persistent atrial fibrillation ([place])   4. DCM (dilated cardiomyopathy) ([place])   5. Chronic combined systolic and diastolic CHF (congestive heart failure) ([place])   6. Dyslipidemia    PLAN:    In order of problems listed above:  1.  ASCAD - coronary artery calcifications on CT but no significant CAD on cath.    2.  HTN - BP is well controlled on exam today.  She will continue on Losartan 25mg  daily, spironolactone 25mg  daily and increasing Bystolic for HR control.    3.  Persistent atrial fibrillation - she is back in atrial fibrillation with RVR at 104bpm today.  She is asymptomatic.  She missed a dose of Xarelto on 3/29 for a thyroid bx but none since then so will set up DCCV for later this week.   She will  continue on Xarelto 20mg  daily and increase Bystolic to 5mg  daily.  Her creatinine was 1.06 and Hbg 12.5 on 01/02/2018.  4.  DCM - EF normalized at 50-55% by last echo  5.  Chronic combined systolic/diastolic CHF - she appears euvolemic on exam today. She will continue on Lasix 40mg  daily, spironolactone 25mg  daily, Losartan 25mg  daily and Bystolic daily.  Renal function stable on labs last month.   6.  Hyperlipidemia with LDL goal < 70.  I will check an FLP and ALT today.  She will continue on atorvastatin 40mg  daily.     Medication Adjustments/Labs and Tests Ordered: Current medicines are reviewed at length with the patient today.  Concerns regarding medicines are outlined above.  Orders Placed This Encounter  Procedures  . EKG 12-Lead   No orders of the defined types were placed in this encounter.   Signed, [name], MD  02/20/2018 10:22 AM    [place]

## 2018-02-20 NOTE — Patient Instructions (Addendum)
Medication Instructions:  Your physician has recommended you make the following change in your medication:  INCREASE: Bystolic to 5 mg one time a day  If you need a refill on your cardiac medications, please contact your pharmacy first.  Labwork: Today for basic metabolic panel   Your physician recommends that you return for lab work on Wednesday May 1st, 2019 for liver function and cholesterol   Testing/Procedures: Your physician has requested that you have an echocardiogram. Echocardiography is a painless test that uses sound waves to create images of your heart. It provides your doctor with information about the size and shape of your heart and how well your heart's chambers and valves are working. This procedure takes approximately one hour. There are no restrictions for this procedure.  Your physician has recommended that you have a Cardioversion (DCCV). Electrical Cardioversion uses a jolt of electricity to your heart either through paddles or wired patches attached to your chest. This is a controlled, usually prescheduled, procedure. Defibrillation is done under light anesthesia in the hospital, and you usually go home the day of the procedure. This is done to get your heart back into a normal rhythm. You are not awake for the procedure. Please see the instruction sheet given to you today.   Follow-Up: Your physician recommends that you schedule a follow-up appointment in: 2 weeks with PA   Your physician wants you to follow-up in: 6 months with Dr. Radford Pax. You will receive a reminder letter in the mail two months in advance. If you don't receive a letter, please call our office to schedule the follow-up appointment.  Any Other Special Instructions Will Be Listed Below (If Applicable).   Thank you for choosing [facility], RN  [phone]  If you need a refill on your cardiac medications before your next appointment, please call your pharmacy.

## 2018-02-20 NOTE — H&P (View-Only) (Signed)
Cardiology Office Note:    Date:  02/20/2018   ID:  Cynthia Herrera, DOB [birth year], MRN [id number]  PCP:  Marletta Lor, MD  Cardiologist:  No primary care provider on file.    Referring MD: Marletta Lor, MD   Chief Complaint  Patient presents with  . Atrial Fibrillation  . Hypertension    History of Present Illness:    Cynthia Herrera is a 72 y.o. female with a hx of ASCAD (coronary artery calcifications on CT scan and on cath with no obstructive lesions), nonischemic dilated CM EF now 50-55%, HTN, dyslipidemia, chronic anticoagulation, chronic systolic CHF and persistent atrial fibrillation s/p TEE/DCCV to NSR but failed to hold NSR. She was again cardioverted on 11/14/16 after more loading with amio but failed to convert. Later that day she spontaneously converted to NSR. She stopped amio due to severe dizziness. She has been maintaining NSR.  She is here today for followup and is doing well.  She denies any chest pain or pressure, SOB, DOE, PND, orthopnea, dizziness, palpitations or syncope. She is compliant with her meds and is tolerating meds with no SE.  She did have some SOB after starting the chemo drug for her breast CA but the med was held and sx improved somewhat.  The SOB now seems to have resolved.  She occasionally has some mild LE edema.     Past Medical History:  Diagnosis Date  . Arthritis   . Breast cancer, left breast ([place]) 2002   "then treated w/chemo and radiation"  . Breast cancer, left breast ([place]) 07/14/2017   "tx'd w/mastectomy"  . Chronic back pain    h/o lumbar stenosis; "no problem since my OR" (`10/27/2017)  . Chronic systolic CHF (congestive heart failure) (HCC)    takes Furosemide and Aldactone daily  . Complication of anesthesia    last surgery iv med when going to sleep"burned as injected"  . COPD (chronic obstructive pulmonary disease) (HCC)    no inhalers   . DCM (dilated cardiomyopathy) ([place])    EF 15-20% ? tachycardia  induced - EF 50-55% on echo 2015  . Dyspnea    with exertion  . Dysrhythmia    afib  . Gallstones   . GERD (gastroesophageal reflux disease)    once in a while;depends on what she eats  . History of bronchitis    "chronic when I smoked; no problem since I quit in 2002" (10/27/2017)  . Hyperlipidemia   . Hypertension    takes Losartan and Metoprolol.   . Joint pain   . Muscle spasm    takes Flexeril daily as needed   . Nocturia   . Osteopenia   . Persistent atrial fibrillation ([place])    s/p TEE DCCV and repeat DCCV 11/14/2013  . Personal history of chemotherapy   . Personal history of colonic polyps    adenomas 03 and 08  . Personal history of radiation therapy   . Pneumonia    hx  . Seasonal allergies   . Thyroid nodule   . Type II diabetes mellitus ([place])   . Vitamin D deficiency    takes Vit D    Past Surgical History:  Procedure Laterality Date  . AXILLARY LYMPH NODE DISSECTION Left 10/2001   persistent intramammary node Archie Endo 03/09/2011  . BACK SURGERY    . BREAST BIOPSY Left 2002  . BREAST BIOPSY Right 06/2017   "node bx was negative"  . BREAST BIOPSY Left 06/2017   "  positive for cancer"  . BREAST LUMPECTOMY WITH RADIOACTIVE SEED LOCALIZATION Right 08/15/2017   Procedure: RIGHT BREAST LUMPECTOMY WITH RADIOACTIVE SEED LOCALIZATION;  Surgeon: Jovita Kussmaul, MD;  Location: [place];  Service: General;  Laterality: Right;  . CARDIAC CATHETERIZATION  2015  . CARDIOVERSION N/A 11/12/2013   Procedure: CARDIOVERSION;  Surgeon: Thayer Headings, MD;  Location: [facility] ENDOSCOPY;  Service: Cardiovascular;  Laterality: N/A;  10:08  Dr. Marissa Nestle, anesthesia present, [place]   60mg ,  propofol 50mg , IV for elective cardioversion....Dr. Cathie Olden delievered synch 120 joules with successful cardioversion to NSR  . CARDIOVERSION N/A 11/12/2013   Procedure: CARDIOVERSION;  Surgeon: Thayer Headings, MD;  Location: [place];  Service: Cardiovascular;  Laterality: N/A;  . CARDIOVERSION N/A  11/14/2013   Procedure: CARDIOVERSION (BEDSIDE);  Surgeon: Larey Dresser, MD;  Location: [place];  Service: Cardiovascular;  Laterality: N/A;  . CATARACT EXTRACTION W/ INTRAOCULAR LENS  IMPLANT, BILATERAL Bilateral ~ 2016  . COLONOSCOPY  2003, September 2008, April 01, 2011   adenomas 03 and 08, polyp 12, diverticulosis  . COLONOSCOPY WITH PROPOFOL N/A 11/16/2016   Procedure: COLONOSCOPY WITH PROPOFOL;  Surgeon: Gatha Mayer, MD;  Location: [facility] ENDOSCOPY;  Service: Endoscopy;  Laterality: N/A;  . JOINT REPLACEMENT    . LAPAROSCOPIC CHOLECYSTECTOMY  2004  . LEFT AND RIGHT HEART CATHETERIZATION WITH CORONARY ANGIOGRAM N/A 02/01/2014   Procedure: LEFT AND RIGHT HEART CATHETERIZATION WITH CORONARY ANGIOGRAM;  Surgeon: Sueanne Margarita, MD;  Location: [facility] CATH LAB;  Service: Cardiovascular;  Laterality: N/A;  . LUMBAR LAMINECTOMY/DECOMPRESSION MICRODISCECTOMY N/A 03/19/2014   Procedure: LUMBAR LAMINECTOMY/DECOMPRESSION MICRODISCECTOMY 4 LEVEL;  Surgeon: Kristeen Miss, MD;  Location: [facility] NEURO ORS;  Service: Neurosurgery;  Laterality: N/A;  L1-2 L2-3 L3-4 L4-5 Laminectomy/Foraminotomy  . MASS EXCISION Left 08/15/2017   Procedure: EXCISION OF LEFT BREAST MASS;  Surgeon: Jovita Kussmaul, MD;  Location: [facility];  Service: General;  Laterality: Left;  Marland Kitchen MASTECTOMY Left 10/27/2017  . MASTECTOMY PARTIAL / LUMPECTOMY W/ AXILLARY LYMPHADENECTOMY  05/08/2001   Archie Endo 03/09/2011  . PORT-A-CATH REMOVAL  2003  . PORTA CATH INSERTION  2002  . TEE WITHOUT CARDIOVERSION N/A 11/12/2013   Procedure: TRANSESOPHAGEAL ECHOCARDIOGRAM (TEE);  Surgeon: Thayer Headings, MD;  Location: [place];  Service: Cardiovascular;  Laterality: N/A;  pt b/p low, pt buccal membranes very dry, lips scapped, pt c/o thirst. NPO since MN and iv FLUIDS TOTAL INFUSING AT TOTAL 20ML HR....  Dr. Cathie Olden order allow NS to bolus during procedure....very dry,NS bolus 250 ml total..pt responding well to meds..  . TOTAL KNEE ARTHROPLASTY Left 2006  . TOTAL  KNEE ARTHROPLASTY Right 05/10/2016   Procedure: RIGHT TOTAL KNEE ARTHROPLASTY;  Surgeon: Gaynelle Arabian, MD;  Location: [facility] ORS;  Service: Orthopedics;  Laterality: Right;  With adductor block  . TOTAL MASTECTOMY Left 10/27/2017   Procedure: LEFT MASTECTOMY;  Surgeon: Jovita Kussmaul, MD;  Location: [place];  Service: General;  Laterality: Left;  . TUBAL LIGATION      Current Medications: Current Meds  Medication Sig  . amoxicillin (AMOXIL) 500 MG capsule Take 2,000 mg by mouth See admin instructions. Take 2000 mg 1 hour prior to dental appt.  Marland Kitchen atorvastatin (LIPITOR) 40 MG tablet TAKE 1 TABLET (40 MG TOTAL) BY MOUTH AT BEDTIME.  . cephALEXin (KEFLEX) 500 MG capsule Take 500 mg by mouth 4 (four) times daily.  . Cholecalciferol (VITAMIN D3) 2000 units capsule Take 2,000 Units by mouth daily.  . fexofenadine (ALLEGRA) 180 MG tablet Take  180 mg by mouth daily as needed for allergies or rhinitis.  . fluticasone (FLONASE) 50 MCG/ACT nasal spray Place 1 spray into both nostrils daily as needed for allergies.   . furosemide (LASIX) 20 MG tablet Take 1 tablet (20 mg total) by mouth 2 (two) times daily. (Patient taking differently: Take 40 mg by mouth daily. )  . glucose blood test strip Use to check CBG AC and QHS.  Marland Kitchen KLOR-CON M20 20 MEQ tablet Take 1 tablet (20 mEq total) by mouth daily.  Marland Kitchen letrozole (FEMARA) 2.5 MG tablet Take 1 tablet (2.5 mg total) by mouth daily.  Marland Kitchen losartan (COZAAR) 25 MG tablet TAKE 1 TABLET BY MOUTH TWICE A DAY  . metFORMIN (GLUCOPHAGE) 500 MG tablet TAKE 1 TABLET TWICE A DAY WITH A MEAL  . nebivolol (BYSTOLIC) 2.5 MG tablet Take 1 tablet (2.5 mg total) by mouth daily.  Marland Kitchen spironolactone (ALDACTONE) 25 MG tablet Take 1 tablet (25 mg total) by mouth daily.  . traMADol (ULTRAM) 50 MG tablet Take 1-2 tablets (50-100 mg total) by mouth every 6 (six) hours as needed.  Alveda Reasons 20 MG TABS tablet TAKE 1 TABLET EVERY DAY WITH SUPPER     Allergies:   Ambien [zolpidem]; Codeine phosphate;  and Medrol [methylprednisolone]   Social History   Socioeconomic History  . Marital status: Widowed    Spouse name: Not on file  . Number of children: 2  . Years of education: Not on file  . Highest education level: Not on file  Occupational History  . Occupation: retired  Scientific laboratory technician  . Financial resource strain: Not on file  . Food insecurity:    Worry: Not on file    Inability: Not on file  . Transportation needs:    Medical: Not on file    Non-medical: Not on file  Tobacco Use  . Smoking status: Former Smoker    Packs/day: 1.50    Years: 35.00    Pack years: 52.50    Types: Cigarettes    Last attempt to quit: 08/24/2001    Years since quitting: 16.5  . Smokeless tobacco: Never Used  Substance and Sexual Activity  . Alcohol use: No  . Drug use: No  . Sexual activity: Never    Birth control/protection: Post-menopausal  Lifestyle  . Physical activity:    Days per week: Not on file    Minutes per session: Not on file  . Stress: Not on file  Relationships  . Social connections:    Talks on phone: Not on file    Gets together: Not on file    Attends religious service: Not on file    Active member of club or organization: Not on file    Attends meetings of clubs or organizations: Not on file    Relationship status: Not on file  Other Topics Concern  . Not on file  Social History Narrative  . Not on file     Family History: The patient's She family history includes Alzheimer's disease in her sister; Dementia in her sister; Diabetes in her brother, father, mother, and sister; Heart disease in her brother and father; Irritable bowel syndrome in her brother; Liver cancer in her mother; Pancreatic cancer in her mother.  ROS:   Please see the history of present illness.    ROS  All other systems reviewed and negative.   EKGs/Labs/Other Studies Reviewed:    The following studies were reviewed today: none  EKG:  EKG is ordered today  and showed atrial  fibrillation with RVR at 104bpm  Recent Labs: 01/02/2018: BUN 17; Creatinine, Ser 1.06; Hemoglobin 12.5; Platelets 335; Potassium 3.6; Sodium 138   Recent Lipid Panel    Component Value Date/Time   CHOL 119 02/03/2017 1207   TRIG 115.0 02/03/2017 1207   HDL 42.60 02/03/2017 1207   CHOLHDL 3 02/03/2017 1207   VLDL 23.0 02/03/2017 1207   LDLCALC 54 02/03/2017 1207    Physical Exam:    VS:  BP 126/64   Pulse 84   Ht 5\' 6"  (1.676 m)   Wt 250 lb (113.4 kg)   BMI 40.35 kg/m     Wt Readings from Last 3 Encounters:  02/20/18 250 lb (113.4 kg)  02/07/18 248 lb 1.6 oz (112.5 kg)  01/05/18 252 lb (114.3 kg)     GEN:  Well nourished, well developed in no acute distress HEENT: Normal NECK: No JVD; No carotid bruits LYMPHATICS: No lymphadenopathy CARDIAC: RRR, no murmurs, rubs, gallops RESPIRATORY:  Clear to auscultation without rales, wheezing or rhonchi  ABDOMEN: Soft, non-tender, non-distended MUSCULOSKELETAL:  No edema; No deformity  SKIN: Warm and dry NEUROLOGIC:  Alert and oriented x 3 PSYCHIATRIC:  Normal affect   ASSESSMENT:    1. Coronary artery disease involving native coronary artery of native heart without angina pectoris   2. Essential hypertension   3. Persistent atrial fibrillation ([place])   4. DCM (dilated cardiomyopathy) ([place])   5. Chronic combined systolic and diastolic CHF (congestive heart failure) ([place])   6. Dyslipidemia    PLAN:    In order of problems listed above:  1.  ASCAD - coronary artery calcifications on CT but no significant CAD on cath.    2.  HTN - BP is well controlled on exam today.  She will continue on Losartan 25mg  daily, spironolactone 25mg  daily and increasing Bystolic for HR control.    3.  Persistent atrial fibrillation - she is back in atrial fibrillation with RVR at 104bpm today.  She is asymptomatic.  She missed a dose of Xarelto on 3/29 for a thyroid bx but none since then so will set up DCCV for later this week.   She will  continue on Xarelto 20mg  daily and increase Bystolic to 5mg  daily.  Her creatinine was 1.06 and Hbg 12.5 on 01/02/2018.  4.  DCM - EF normalized at 50-55% by last echo  5.  Chronic combined systolic/diastolic CHF - she appears euvolemic on exam today. She will continue on Lasix 40mg  daily, spironolactone 25mg  daily, Losartan 25mg  daily and Bystolic daily.  Renal function stable on labs last month.   6.  Hyperlipidemia with LDL goal < 70.  I will check an FLP and ALT today.  She will continue on atorvastatin 40mg  daily.     Medication Adjustments/Labs and Tests Ordered: Current medicines are reviewed at length with the patient today.  Concerns regarding medicines are outlined above.  Orders Placed This Encounter  Procedures  . EKG 12-Lead   No orders of the defined types were placed in this encounter.   Signed, [name], MD  02/20/2018 10:22 AM    [place]

## 2018-02-22 ENCOUNTER — Ambulatory Visit (HOSPITAL_COMMUNITY)
Admission: RE | Admit: 2018-02-22 | Discharge: 2018-02-22 | Disposition: A | Discharge status: Home / Self Care | Payer: Medicare Other | Source: Ambulatory Visit | Attending: Cardiology | Admitting: Cardiology

## 2018-02-22 ENCOUNTER — Other Ambulatory Visit: Payer: Self-pay

## 2018-02-22 ENCOUNTER — Ambulatory Visit (HOSPITAL_COMMUNITY): Payer: Medicare Other | Admitting: Anesthesiology

## 2018-02-22 ENCOUNTER — Other Ambulatory Visit: Payer: Medicare Other

## 2018-02-22 ENCOUNTER — Encounter (HOSPITAL_COMMUNITY): Payer: Self-pay

## 2018-02-22 ENCOUNTER — Encounter (HOSPITAL_COMMUNITY)
Admission: RE | Disposition: A | Discharge status: Home / Self Care | Payer: Self-pay | Source: Ambulatory Visit | Attending: Cardiology

## 2018-02-22 DIAGNOSIS — I420 Dilated cardiomyopathy: Secondary | ICD-10-CM | POA: Insufficient documentation

## 2018-02-22 DIAGNOSIS — Z79899 Other long term (current) drug therapy: Secondary | ICD-10-CM | POA: Diagnosis not present

## 2018-02-22 DIAGNOSIS — I25119 Atherosclerotic heart disease of native coronary artery with unspecified angina pectoris: Secondary | ICD-10-CM | POA: Diagnosis not present

## 2018-02-22 DIAGNOSIS — Z833 Family history of diabetes mellitus: Secondary | ICD-10-CM | POA: Insufficient documentation

## 2018-02-22 DIAGNOSIS — E119 Type 2 diabetes mellitus without complications: Secondary | ICD-10-CM | POA: Diagnosis not present

## 2018-02-22 DIAGNOSIS — E785 Hyperlipidemia, unspecified: Secondary | ICD-10-CM | POA: Diagnosis not present

## 2018-02-22 DIAGNOSIS — Z9842 Cataract extraction status, left eye: Secondary | ICD-10-CM | POA: Diagnosis not present

## 2018-02-22 DIAGNOSIS — K219 Gastro-esophageal reflux disease without esophagitis: Secondary | ICD-10-CM | POA: Insufficient documentation

## 2018-02-22 DIAGNOSIS — Z9049 Acquired absence of other specified parts of digestive tract: Secondary | ICD-10-CM | POA: Diagnosis not present

## 2018-02-22 DIAGNOSIS — J449 Chronic obstructive pulmonary disease, unspecified: Secondary | ICD-10-CM | POA: Diagnosis not present

## 2018-02-22 DIAGNOSIS — Z9841 Cataract extraction status, right eye: Secondary | ICD-10-CM | POA: Insufficient documentation

## 2018-02-22 DIAGNOSIS — Z885 Allergy status to narcotic agent status: Secondary | ICD-10-CM | POA: Diagnosis not present

## 2018-02-22 DIAGNOSIS — M1990 Unspecified osteoarthritis, unspecified site: Secondary | ICD-10-CM | POA: Insufficient documentation

## 2018-02-22 DIAGNOSIS — I110 Hypertensive heart disease with heart failure: Secondary | ICD-10-CM | POA: Diagnosis not present

## 2018-02-22 DIAGNOSIS — Z9012 Acquired absence of left breast and nipple: Secondary | ICD-10-CM | POA: Insufficient documentation

## 2018-02-22 DIAGNOSIS — Z800 Family history of malignant neoplasm of digestive organs: Secondary | ICD-10-CM | POA: Insufficient documentation

## 2018-02-22 DIAGNOSIS — I481 Persistent atrial fibrillation: Secondary | ICD-10-CM | POA: Diagnosis not present

## 2018-02-22 DIAGNOSIS — I4891 Unspecified atrial fibrillation: Secondary | ICD-10-CM | POA: Diagnosis not present

## 2018-02-22 DIAGNOSIS — Z87891 Personal history of nicotine dependence: Secondary | ICD-10-CM | POA: Diagnosis not present

## 2018-02-22 DIAGNOSIS — Z86010 Personal history of colonic polyps: Secondary | ICD-10-CM | POA: Insufficient documentation

## 2018-02-22 DIAGNOSIS — M8580 Other specified disorders of bone density and structure, unspecified site: Secondary | ICD-10-CM | POA: Diagnosis not present

## 2018-02-22 DIAGNOSIS — E041 Nontoxic single thyroid nodule: Secondary | ICD-10-CM | POA: Diagnosis not present

## 2018-02-22 DIAGNOSIS — Z7901 Long term (current) use of anticoagulants: Secondary | ICD-10-CM | POA: Insufficient documentation

## 2018-02-22 DIAGNOSIS — Z923 Personal history of irradiation: Secondary | ICD-10-CM | POA: Diagnosis not present

## 2018-02-22 DIAGNOSIS — Z7984 Long term (current) use of oral hypoglycemic drugs: Secondary | ICD-10-CM | POA: Diagnosis not present

## 2018-02-22 DIAGNOSIS — I5042 Chronic combined systolic (congestive) and diastolic (congestive) heart failure: Secondary | ICD-10-CM | POA: Diagnosis not present

## 2018-02-22 DIAGNOSIS — Z96653 Presence of artificial knee joint, bilateral: Secondary | ICD-10-CM | POA: Insufficient documentation

## 2018-02-22 DIAGNOSIS — Z853 Personal history of malignant neoplasm of breast: Secondary | ICD-10-CM | POA: Insufficient documentation

## 2018-02-22 DIAGNOSIS — I5022 Chronic systolic (congestive) heart failure: Secondary | ICD-10-CM | POA: Diagnosis not present

## 2018-02-22 DIAGNOSIS — Z8379 Family history of other diseases of the digestive system: Secondary | ICD-10-CM | POA: Insufficient documentation

## 2018-02-22 DIAGNOSIS — Z820 Family history of epilepsy and other diseases of the nervous system: Secondary | ICD-10-CM | POA: Insufficient documentation

## 2018-02-22 DIAGNOSIS — Z9221 Personal history of antineoplastic chemotherapy: Secondary | ICD-10-CM | POA: Insufficient documentation

## 2018-02-22 DIAGNOSIS — Z9889 Other specified postprocedural states: Secondary | ICD-10-CM | POA: Insufficient documentation

## 2018-02-22 DIAGNOSIS — Z955 Presence of coronary angioplasty implant and graft: Secondary | ICD-10-CM | POA: Insufficient documentation

## 2018-02-22 DIAGNOSIS — Z8249 Family history of ischemic heart disease and other diseases of the circulatory system: Secondary | ICD-10-CM | POA: Insufficient documentation

## 2018-02-22 DIAGNOSIS — I2510 Atherosclerotic heart disease of native coronary artery without angina pectoris: Secondary | ICD-10-CM | POA: Diagnosis not present

## 2018-02-22 DIAGNOSIS — Z888 Allergy status to other drugs, medicaments and biological substances status: Secondary | ICD-10-CM | POA: Diagnosis not present

## 2018-02-22 HISTORY — PX: CARDIOVERSION: SHX1299

## 2018-02-22 SURGERY — CARDIOVERSION
Anesthesia: General

## 2018-02-22 MED ORDER — LIDOCAINE HCL (CARDIAC) PF 100 MG/5ML IV SOSY
PREFILLED_SYRINGE | INTRAVENOUS | Status: DC | PRN
  Administered 2018-02-22: 40 mg via INTRATRACHEAL

## 2018-02-22 MED ORDER — SODIUM CHLORIDE 0.9 % IV SOLN
INTRAVENOUS | Status: DC | PRN
  Administered 2018-02-22: 09:00:00 via INTRAVENOUS

## 2018-02-22 MED ORDER — PROPOFOL 10 MG/ML IV BOLUS
INTRAVENOUS | Status: DC | PRN
  Administered 2018-02-22: 50 mg via INTRAVENOUS
  Administered 2018-02-22: 20 mg via INTRAVENOUS

## 2018-02-22 NOTE — CV Procedure (Signed)
   Electrical Cardioversion Procedure Note Cynthia Herrera [id number] [birth year]  Procedure: Electrical Cardioversion Indications:  Atrial Fibrillation  Time Out: Verified patient identification, verified procedure,medications/allergies/relevent history reviewed, required imaging and test results available.  Performed  Procedure Details  The patient was NPO after midnight. Anesthesia was administered at the beside  by Dr.Foster with 79mg  of propofol and 40mg  Lidocaine.  Cardioversion was done with synchronized biphasic defibrillation with AP pads with 150watts but failed to convert to NSR.  Cardioversion was done with synchronized biphasic defibrillation with AP pads with 200watts.  The patient converted to normal sinus rhythm. The patient tolerated the procedure well   IMPRESSION:  Successful cardioversion of atrial fibrillation    Cynthia Herrera 02/22/2018, 9:28 AM

## 2018-02-22 NOTE — Discharge Instructions (Signed)
Electrical Cardioversion, Care After °This sheet gives you information about how to care for yourself after your procedure. Your health care provider may also give you more specific instructions. If you have problems or questions, contact your health care provider. °What can I expect after the procedure? °After the procedure, it is common to have: °· Some redness on the skin where the shocks were given. ° °Follow these instructions at home: °· Do not drive for 24 hours if you were given a medicine to help you relax (sedative). °· Take over-the-counter and prescription medicines only as told by your health care provider. °· Ask your health care provider how to check your pulse. Check it often. °· Rest for 48 hours after the procedure or as told by your health care provider. °· Avoid or limit your caffeine use as told by your health care provider. °Contact a health care provider if: °· You feel like your heart is beating too quickly or your pulse is not regular. °· You have a serious muscle cramp that does not go away. °Get help right away if: °· You have discomfort in your chest. °· You are dizzy or you feel faint. °· You have trouble breathing or you are short of breath. °· Your speech is slurred. °· You have trouble moving an arm or leg on one side of your body. °· Your fingers or toes turn cold or blue. °This information is not intended to replace advice given to you by your health care provider. Make sure you discuss any questions you have with your health care provider. °Document Released: 08/01/2013 Document Revised: 05/14/2016 Document Reviewed: 04/16/2016 °Elsevier Interactive Patient Education © 2018 [employer] ° °

## 2018-02-22 NOTE — Anesthesia Postprocedure Evaluation (Signed)
Anesthesia Post Note  Patient: Cynthia Herrera  Procedure(s) Performed: CARDIOVERSION (N/A )     Patient location during evaluation: PACU Anesthesia Type: General Level of consciousness: awake and alert Pain management: pain level controlled Vital Signs Assessment: post-procedure vital signs reviewed and stable Respiratory status: spontaneous breathing, nonlabored ventilation and respiratory function stable Cardiovascular status: blood pressure returned to baseline and stable Postop Assessment: no apparent nausea or vomiting Anesthetic complications: no    Last Vitals:  Vitals:   02/22/18 0855  BP: 120/70  Pulse: 100  Resp: 13  Temp: 36.6 C  SpO2: 93%    Last Pain:  Vitals:   02/22/18 0855  TempSrc: Oral  PainSc: 0-No pain                 Hideo Googe A.

## 2018-02-22 NOTE — Anesthesia Preprocedure Evaluation (Addendum)
Anesthesia Evaluation  Patient identified by MRN, date of birth, ID band Patient awake    Reviewed: Allergy & Precautions, NPO status , Patient's Chart, lab work & pertinent test results  History of Anesthesia Complications (+) history of anesthetic complications  Airway Mallampati: II  TM Distance: >3 FB Neck ROM: Full    Dental no notable dental hx. (+) Teeth Intact, Missing   Pulmonary shortness of breath and with exertion, pneumonia, resolved, COPD,  COPD inhaler, former smoker,    Pulmonary exam normal breath sounds clear to auscultation + decreased breath sounds      Cardiovascular hypertension, Pt. on medications and Pt. on home beta blockers + CAD, + Peripheral Vascular Disease and +CHF  + dysrhythmias Atrial Fibrillation  Rhythm:Irregular Rate:Normal + Systolic murmurs Dilated Cardiomyopathy LVEF 15-20% but now improved to 50-55% with therapy   Neuro/Psych negative psych ROS   GI/Hepatic Neg liver ROS, GERD  Medicated and Controlled,Hx/o colon polyps   Endo/Other  diabetes, Well Controlled, Type 2, Oral Hypoglycemic AgentsMorbid obesityLeft breast Ca- S/P chemo and RT Hyperlipidemia  Renal/GU negative Renal ROS  negative genitourinary   Musculoskeletal  (+) Arthritis , Osteoarthritis,  Chronic LBP Lumbar spinal stenosis   Abdominal (+) + obese,   Peds  Hematology Xarelto- last dose 4/30   Anesthesia Other Findings   Reproductive/Obstetrics                           Anesthesia Physical Anesthesia Plan  ASA: III  Anesthesia Plan: General   Post-op Pain Management:    Induction:   PONV Risk Score and Plan: 3 and Propofol infusion, Ondansetron and Treatment may vary due to age or medical condition  Airway Management Planned: Mask  Additional Equipment:   Intra-op Plan:   Post-operative Plan:   Informed Consent: I have reviewed the patients History and Physical, chart,  labs and discussed the procedure including the risks, benefits and alternatives for the proposed anesthesia with the patient or authorized representative who has indicated his/her understanding and acceptance.   Dental advisory given  Plan Discussed with: Anesthesiologist, Surgeon and CRNA  Anesthesia Plan Comments:         Anesthesia Quick Evaluation

## 2018-02-22 NOTE — Anesthesia Procedure Notes (Signed)
Procedure Name: General with mask airway Date/Time: 02/22/2018 9:30 AM Performed by: Mariea Clonts, CRNA Pre-anesthesia Checklist: Patient identified, Emergency Drugs available, Suction available, Patient being monitored and Timeout performed Patient Re-evaluated:Patient Re-evaluated prior to induction Oxygen Delivery Method: Ambu bag Preoxygenation: Pre-oxygenation with 100% oxygen

## 2018-02-22 NOTE — Transfer of Care (Signed)
Immediate Anesthesia Transfer of Care Note  Patient: Cynthia Herrera  Procedure(s) Performed: CARDIOVERSION (N/A )  Patient Location: PACU  Anesthesia Type:General  Level of Consciousness: awake, alert  and oriented  Airway & Oxygen Therapy: Patient Spontanous Breathing  Post-op Assessment: Report given to RN, Post -op Vital signs reviewed and stable and Patient moving all extremities X 4  Post vital signs: Reviewed and stable  Last Vitals:  Vitals Value Taken Time  BP    Temp    Pulse    Resp    SpO2      Last Pain:  Vitals:   02/22/18 0855  TempSrc: Oral  PainSc: 0-No pain         Complications: No apparent anesthesia complications

## 2018-02-22 NOTE — Interval H&P Note (Signed)
History and Physical Interval Note:  02/22/2018 9:27 AM  Cynthia Herrera  has presented today for surgery, with the diagnosis of a fib  The various methods of treatment have been discussed with the patient and family. After consideration of risks, benefits and other options for treatment, the patient has consented to  Procedure(s): CARDIOVERSION (N/A) as a surgical intervention .  The patient's history has been reviewed, patient examined, no change in status, stable for surgery.  I have reviewed the patient's chart and labs.  Questions were answered to the patient's satisfaction.     [name]

## 2018-02-23 ENCOUNTER — Encounter: Payer: Self-pay | Admitting: Podiatry

## 2018-02-23 ENCOUNTER — Telehealth: Payer: Self-pay | Admitting: *Deleted

## 2018-02-23 ENCOUNTER — Ambulatory Visit: Payer: Medicare Other | Admitting: Podiatry

## 2018-02-23 DIAGNOSIS — B353 Tinea pedis: Secondary | ICD-10-CM

## 2018-02-23 MED ORDER — FLUCONAZOLE 150 MG PO TABS
150.0000 mg | ORAL_TABLET | ORAL | 1 refills | Status: DC
Start: 2018-02-23 — End: 2018-03-21

## 2018-02-23 MED ORDER — CLOTRIMAZOLE-BETAMETHASONE 1-0.05 % EX CREA: 1.0000 "application " | TOPICAL_CREAM | Freq: Two times a day (BID) | CUTANEOUS | 0 refills | Status: DC

## 2018-02-23 NOTE — Telephone Encounter (Signed)
Received call from pt stating that she was on femara & was having trouble breathing & was told to hold x 10 days to see if improved.  She reports there was no difference with holding med & she went back on it.  She states that the problem was that she was in A-fib & had to go to hospital & is doing better now.  Message to Dr Lindi Adie.

## 2018-02-23 NOTE — Progress Notes (Signed)
Reports athlete's foot  Subjective:  Patient ID: Cynthia Herrera, female    DOB: 07-27-1946,  MRN: [id number]  Chief Complaint  Patient presents with  . Rash    b/l all over feet x 2 + years. Pt stated I've tried OTC athletes foot cream and seen 2 dermatologists and nothing has helped     72 y.o. female presents with the above complaint.  Infection on the bottom of both feet for over 2 years.  Has seen to dermatologist for this issue but given topical medications.  This states that they got better for a time but eventually came back.  Never had full continued resolution.  States that she was not given any oral medicines because she was told it would interfere with her blood thinner  Past Medical History:  Diagnosis Date  . Arthritis   . Breast cancer, left breast ([place]) 2002   "then treated w/chemo and radiation"  . Breast cancer, left breast ([place]) 07/14/2017   "tx'd w/mastectomy"  . Chronic back pain    h/o lumbar stenosis; "no problem since my OR" (`10/27/2017)  . Chronic systolic CHF (congestive heart failure) (H[facility])    takes Furosemide and Aldactone daily  . Complication of anesthesia    last surgery iv med when going to sleep"burned as injected"  . COPD (chronic obstructive pulmonary disease) ([facility])    no inhalers   . DCM (dilated cardiomyopathy) ([place])    EF 15-20% ? tachycardia induced - EF 50-55% on echo 2015  . Dyspnea    with exertion  . Dysrhythmia    afib  . Gallstones   . GERD (gastroesophageal reflux disease)    once in a while;depends on what she eats  . History of bronchitis    "chronic when I smoked; no problem since I quit in 2002" (10/27/2017)  . Hyperlipidemia   . Hypertension    takes Losartan and Metoprolol.   . Joint pain   . Muscle spasm    takes Flexeril daily as needed   . Nocturia   . Osteopenia   . Persistent atrial fibrillation ([place])    s/p TEE DCCV and repeat DCCV 11/14/2013  . Personal history of chemotherapy   . Personal history of colonic  polyps    adenomas 03 and 08  . Personal history of radiation therapy   . Pneumonia    hx  . Seasonal allergies   . Thyroid nodule   . Type II diabetes mellitus ([place])   . Vitamin D deficiency    takes Vit D   Past Surgical History:  Procedure Laterality Date  . AXILLARY LYMPH NODE DISSECTION Left 10/2001   persistent intramammary node Archie Endo 03/09/2011  . BACK SURGERY    . BREAST BIOPSY Left 2002  . BREAST BIOPSY Right 06/2017   "node bx was negative"  . BREAST BIOPSY Left 06/2017   "positive for cancer"  . BREAST LUMPECTOMY WITH RADIOACTIVE SEED LOCALIZATION Right 08/15/2017   Procedure: RIGHT BREAST LUMPECTOMY WITH RADIOACTIVE SEED LOCALIZATION;  Surgeon: Jovita Kussmaul, MD;  Location: [facility];  Service: General;  Laterality: Right;  . CARDIAC CATHETERIZATION  2015  . CARDIOVERSION N/A 11/12/2013   Procedure: CARDIOVERSION;  Surgeon: Thayer Headings, MD;  Location: [facility] ENDOSCOPY;  Service: Cardiovascular;  Laterality: N/A;  10:08  Dr. Marissa Nestle, anesthesia present, [place]   60mg ,  propofol 50mg , IV for elective cardioversion....Dr. Cathie Olden delievered synch 120 joules with successful cardioversion to NSR  . CARDIOVERSION N/A 11/12/2013   Procedure: CARDIOVERSION;  Surgeon: Thayer Headings, MD;  Location: [place];  Service: Cardiovascular;  Laterality: N/A;  . CARDIOVERSION N/A 11/14/2013   Procedure: CARDIOVERSION (BEDSIDE);  Surgeon: Larey Dresser, MD;  Location: [place];  Service: Cardiovascular;  Laterality: N/A;  . CARDIOVERSION N/A 02/22/2018   Procedure: CARDIOVERSION;  Surgeon: Sueanne Margarita, MD;  Location: [facility] ENDOSCOPY;  Service: Cardiovascular;  Laterality: N/A;  . CATARACT EXTRACTION W/ INTRAOCULAR LENS  IMPLANT, BILATERAL Bilateral ~ 2016  . COLONOSCOPY  2003, September 2008, April 01, 2011   adenomas 03 and 08, polyp 12, diverticulosis  . COLONOSCOPY WITH PROPOFOL N/A 11/16/2016   Procedure: COLONOSCOPY WITH PROPOFOL;  Surgeon: Gatha Mayer, MD;  Location: [facility] ENDOSCOPY;   Service: Endoscopy;  Laterality: N/A;  . JOINT REPLACEMENT    . LAPAROSCOPIC CHOLECYSTECTOMY  2004  . LEFT AND RIGHT HEART CATHETERIZATION WITH CORONARY ANGIOGRAM N/A 02/01/2014   Procedure: LEFT AND RIGHT HEART CATHETERIZATION WITH CORONARY ANGIOGRAM;  Surgeon: Sueanne Margarita, MD;  Location: [place] CATH LAB;  Service: Cardiovascular;  Laterality: N/A;  . LUMBAR LAMINECTOMY/DECOMPRESSION MICRODISCECTOMY N/A 03/19/2014   Procedure: LUMBAR LAMINECTOMY/DECOMPRESSION MICRODISCECTOMY 4 LEVEL;  Surgeon: Kristeen Miss, MD;  Location: [place] NEURO ORS;  Service: Neurosurgery;  Laterality: N/A;  L1-2 L2-3 L3-4 L4-5 Laminectomy/Foraminotomy  . MASS EXCISION Left 08/15/2017   Procedure: EXCISION OF LEFT BREAST MASS;  Surgeon: Jovita Kussmaul, MD;  Location: [place];  Service: General;  Laterality: Left;  Marland Kitchen MASTECTOMY Left 10/27/2017  . MASTECTOMY PARTIAL / LUMPECTOMY W/ AXILLARY LYMPHADENECTOMY  05/08/2001   Archie Endo 03/09/2011  . PORT-A-CATH REMOVAL  2003  . PORTA CATH INSERTION  2002  . TEE WITHOUT CARDIOVERSION N/A 11/12/2013   Procedure: TRANSESOPHAGEAL ECHOCARDIOGRAM (TEE);  Surgeon: Thayer Headings, MD;  Location: [place];  Service: Cardiovascular;  Laterality: N/A;  pt b/p low, pt buccal membranes very dry, lips scapped, pt c/o thirst. NPO since MN and iv FLUIDS TOTAL INFUSING AT TOTAL 20ML HR....  Dr. Cathie Olden order allow NS to bolus during procedure....very dry,NS bolus 250 ml total..pt responding well to meds..  . TOTAL KNEE ARTHROPLASTY Left 2006  . TOTAL KNEE ARTHROPLASTY Right 05/10/2016   Procedure: RIGHT TOTAL KNEE ARTHROPLASTY;  Surgeon: Gaynelle Arabian, MD;  Location: [facility] ORS;  Service: Orthopedics;  Laterality: Right;  With adductor block  . TOTAL MASTECTOMY Left 10/27/2017   Procedure: LEFT MASTECTOMY;  Surgeon: Jovita Kussmaul, MD;  Location: [place];  Service: General;  Laterality: Left;  . TUBAL LIGATION      Current Outpatient Medications:  .  amoxicillin (AMOXIL) 500 MG capsule, Take 2,000 mg by mouth  See admin instructions. Take 2000 mg by mouth 1 hour prior to dental appointment, Disp: , Rfl: 5 .  atorvastatin (LIPITOR) 40 MG tablet, TAKE 1 TABLET (40 MG TOTAL) BY MOUTH AT BEDTIME., Disp: 90 tablet, Rfl: 3 .  Cholecalciferol (VITAMIN D3) 2000 units capsule, Take 2,000 Units by mouth daily., Disp: , Rfl:  .  clotrimazole-betamethasone (LOTRISONE) cream, Apply 1 application topically 2 (two) times daily., Disp: 30 g, Rfl: 0 .  fexofenadine (ALLEGRA) 180 MG tablet, Take 180 mg by mouth daily as needed for allergies or rhinitis., Disp: , Rfl:  .  fluconazole (DIFLUCAN) 150 MG tablet, Take 1 tablet (150 mg total) by mouth once a week., Disp: 6 tablet, Rfl: 1 .  fluticasone (FLONASE) 50 MCG/ACT nasal spray, Place 1 spray into both nostrils daily as needed for allergies. , Disp: , Rfl:  .  furosemide (LASIX) 20 MG tablet,  Take 1 tablet (20 mg total) by mouth 2 (two) times daily., Disp: 180 tablet, Rfl: 3 .  glucose blood test strip, Use to check CBG AC and QHS. (Patient not taking: Reported on 02/20/2018), Disp: 200 each, Rfl: 12 .  KLOR-CON M20 20 MEQ tablet, Take 1 tablet (20 mEq total) by mouth daily., Disp: 90 tablet, Rfl: 3 .  letrozole (FEMARA) 2.5 MG tablet, Take 1 tablet (2.5 mg total) by mouth daily., Disp: 90 tablet, Rfl: 3 .  losartan (COZAAR) 25 MG tablet, TAKE 1 TABLET BY MOUTH TWICE A DAY, Disp: 180 tablet, Rfl: 3 .  metFORMIN (GLUCOPHAGE) 500 MG tablet, TAKE 1 TABLET TWICE A DAY WITH A MEAL, Disp: 180 tablet, Rfl: 1 .  nebivolol (BYSTOLIC) 5 MG tablet, Take 1 tablet (5 mg total) by mouth daily., Disp: 30 tablet, Rfl: 11 .  spironolactone (ALDACTONE) 25 MG tablet, Take 1 tablet (25 mg total) by mouth daily., Disp: 90 tablet, Rfl: 3 .  traMADol (ULTRAM) 50 MG tablet, Take 1-2 tablets (50-100 mg total) by mouth every 6 (six) hours as needed. (Patient taking differently: Take 50-100 mg by mouth every 6 (six) hours as needed for moderate pain. ), Disp: 20 tablet, Rfl: 0 .  XARELTO 20 MG TABS  tablet, TAKE 1 TABLET EVERY DAY WITH SUPPER, Disp: 30 tablet, Rfl: 6  Allergies  Allergen Reactions  . Ambien [Zolpidem] Other (See Comments)    Hallucinations, up walking around  . Codeine Phosphate Nausea And Vomiting  . Medrol [Methylprednisolone] Other (See Comments)    Felt really weird w the high dose oral steroid, but tolerates low doses or oral steroid   Review of Systems: Negative except as noted in the HPI. Denies N/V/F/Ch. Objective:  There were no vitals filed for this visit. General AA&O x3. Normal mood and affect.  Vascular Dorsalis pedis and posterior tibial pulses  present 2+ bilaterally  Capillary refill normal to all digits. Pedal hair growth normal.  Neurologic Epicritic sensation grossly present.  Dermatologic Xerosis with scaling to the plantar foot bilateral in a moccasin distribution educated on proper hygiene Interspaces clear of maceration. Nails appear thickened but overlying the nail polish prevents full exam  Orthopedic: MMT 5/5 in dorsiflexion, plantarflexion, inversion, and eversion. Normal joint ROM without pain or crepitus.   Assessment & Plan:  Patient was evaluated and treated and all questions answered.  Tinea pedis, onychomycosis  -Rx weekly fluconazole given recalcitrant to topical therapy.  No interactions with either her anticoagulant or her cancer medication. -Unable to eval nails today due to nail polish however patient states that she has nail fungus will monitor next visit patient advised to remove the polish  Return in about 5 weeks (around 03/30/2018) for Nail Fungus, Tinea.

## 2018-02-24 ENCOUNTER — Other Ambulatory Visit (HOSPITAL_COMMUNITY): Payer: Medicare Other

## 2018-02-28 ENCOUNTER — Telehealth: Payer: Self-pay | Admitting: Cardiology

## 2018-02-28 NOTE — Telephone Encounter (Signed)
Patient states on Sunday she was having some sob with walking around her house. She states the symptom would come and go. On Monday it had resolved and she felt fine. Patient denies chest pain, palpitations or syncope. Patient was being seen by Dr. Marla Roe, plastic surgeon for reconstructive surgery on right breast. Dr. Marla Roe recommended that patient follow up with cardiologist for recurrent afib. Patient had a successful cardioversion on 02/22/18. Patient is schedule for echo on 5/10 and has a f/u appt with Havery Moros on 03/06/18. She is on Xarelto 20 mg once a day. I informed patient to keep her follow up appt with Sharyn Lull and I will forward to Dr. Radford Pax for additional recommendations. She verbalized understanding and thankful for the call.

## 2018-02-28 NOTE — Telephone Encounter (Signed)
Agree with recommendations.  

## 2018-02-28 NOTE — Telephone Encounter (Signed)
Patient c/o Palpitations:  High priority if patient c/o lightheadedness, shortness of breath, or chest pain  1) How long have you had palpitations/irregular HR/ Afib? Are you having the symptoms now? Yes/no  2) Are you currently experiencing lightheadedness, SOB or CP? no  Do you have a history of afib (atrial fibrillation) or irregular heart rhythm? yes 3) Have you checked your BP or HR? (document readings if available): 113/58    4) Are you experiencing any other symptoms? When she is up moving around she get sob

## 2018-03-03 ENCOUNTER — Ambulatory Visit (HOSPITAL_COMMUNITY): Payer: Medicare Other | Attending: Cardiology

## 2018-03-03 ENCOUNTER — Other Ambulatory Visit: Payer: Medicare Other | Admitting: *Deleted

## 2018-03-03 ENCOUNTER — Other Ambulatory Visit: Payer: Self-pay

## 2018-03-03 DIAGNOSIS — I340 Nonrheumatic mitral (valve) insufficiency: Secondary | ICD-10-CM | POA: Diagnosis not present

## 2018-03-03 DIAGNOSIS — I481 Persistent atrial fibrillation: Secondary | ICD-10-CM | POA: Diagnosis not present

## 2018-03-03 DIAGNOSIS — I420 Dilated cardiomyopathy: Secondary | ICD-10-CM | POA: Diagnosis not present

## 2018-03-03 DIAGNOSIS — Z853 Personal history of malignant neoplasm of breast: Secondary | ICD-10-CM | POA: Diagnosis not present

## 2018-03-03 DIAGNOSIS — Z87891 Personal history of nicotine dependence: Secondary | ICD-10-CM | POA: Diagnosis not present

## 2018-03-03 DIAGNOSIS — I110 Hypertensive heart disease with heart failure: Secondary | ICD-10-CM | POA: Diagnosis not present

## 2018-03-03 DIAGNOSIS — E785 Hyperlipidemia, unspecified: Secondary | ICD-10-CM | POA: Insufficient documentation

## 2018-03-03 DIAGNOSIS — I2510 Atherosclerotic heart disease of native coronary artery without angina pectoris: Secondary | ICD-10-CM | POA: Diagnosis not present

## 2018-03-03 DIAGNOSIS — I4819 Other persistent atrial fibrillation: Secondary | ICD-10-CM

## 2018-03-03 DIAGNOSIS — I509 Heart failure, unspecified: Secondary | ICD-10-CM | POA: Insufficient documentation

## 2018-03-03 DIAGNOSIS — R0602 Shortness of breath: Secondary | ICD-10-CM | POA: Diagnosis not present

## 2018-03-03 DIAGNOSIS — E119 Type 2 diabetes mellitus without complications: Secondary | ICD-10-CM | POA: Insufficient documentation

## 2018-03-03 LAB — HEPATIC FUNCTION PANEL
ALT: 12 IU/L (ref 0–32)
AST: 14 IU/L (ref 0–40)
Albumin: 4.5 g/dL (ref 3.5–4.8)
Alkaline Phosphatase: 124 IU/L — ABNORMAL HIGH (ref 39–117)
Bilirubin Total: 0.7 mg/dL (ref 0.0–1.2)
Bilirubin, Direct: 0.23 mg/dL (ref 0.00–0.40)
Total Protein: 7 g/dL (ref 6.0–8.5)

## 2018-03-03 LAB — LIPID PANEL
CHOLESTEROL TOTAL: 121 mg/dL (ref 100–199)
Chol/HDL Ratio: 3.1 ratio (ref 0.0–4.4)
HDL: 39 mg/dL — AB (ref >39)
LDL Calculated: 56 mg/dL (ref 0–99)
TRIGLYCERIDES: 128 mg/dL (ref 0–149)
VLDL Cholesterol Cal: 26 mg/dL (ref 5–40)

## 2018-03-03 NOTE — Progress Notes (Signed)
Cardiology Office Note    Date:  03/06/2018   ID:  [name], [name] [birth year], MRN [id number]  PCP:  Marletta Lor, MD  Cardiologist: No primary care provider on file.  No chief complaint on file.   History of Present Illness:  Cynthia Herrera is a 72 y.o. female with history of Coronary calcifications on Coronary CT with nonobstructive CAD on cath, history of NICM which has improved LVEF 50-55% but now back down to 25%, HTN, dyslipidemia, PAF S/P TEE/DCCV but failed to hold NSR, repeat cardioversion 11/14/16 after more Amio load, but didn't convert, but converted later that day spontaneouly. Amio stopped secondary to severe dizziness. Last saw Dr. Radford Pax 02/20/18 and was back in Afib with RVR. She was on Xarelto and Bystolic increased. She underwent DCCV 02/22/18.  Patient was referred back to [place] by Dr. Marla Roe who plans to do breast reduction surgery on her 03/22/18 for recurrent Afib with RVR.  Patient comes in today and says she's going in/out of Afib. Gets short of breath with exertion-coming in here today. Goes away with rest.  2D echo on 03/03/2018 patient was back in atrial fibrillation and LVEF is now 25%.  Past Medical History:  Diagnosis Date  . Arthritis   . Breast cancer, left breast ([place]) 2002   "then treated w/chemo and radiation"  . Breast cancer, left breast ([place]) 07/14/2017   "tx'd w/mastectomy"  . Chronic back pain    h/o lumbar stenosis; "no problem since my OR" (`10/27/2017)  . Chronic systolic CHF (congestive heart failure) (HCC)    takes Furosemide and Aldactone daily  . Complication of anesthesia    last surgery iv med when going to sleep"burned as injected"  . COPD (chronic obstructive pulmonary disease) (HCC)    no inhalers   . DCM (dilated cardiomyopathy) ([place])    EF 15-20% ? tachycardia induced - EF 50-55% on echo 2015  . Dyspnea    with exertion  . Dysrhythmia    afib  . Gallstones   . GERD (gastroesophageal reflux disease)    once  in a while;depends on what she eats  . History of bronchitis    "chronic when I smoked; no problem since I quit in 2002" (10/27/2017)  . Hyperlipidemia   . Hypertension    takes Losartan and Metoprolol.   . Joint pain   . Muscle spasm    takes Flexeril daily as needed   . Nocturia   . Osteopenia   . Persistent atrial fibrillation ([place])    s/p TEE DCCV and repeat DCCV 11/14/2013  . Personal history of chemotherapy   . Personal history of colonic polyps    adenomas 03 and 08  . Personal history of radiation therapy   . Pneumonia    hx  . Seasonal allergies   . Thyroid nodule   . Type II diabetes mellitus ([place])   . Vitamin D deficiency    takes Vit D    Past Surgical History:  Procedure Laterality Date  . AXILLARY LYMPH NODE DISSECTION Left 10/2001   persistent intramammary node Archie Endo 03/09/2011  . BACK SURGERY    . BREAST BIOPSY Left 2002  . BREAST BIOPSY Right 06/2017   "node bx was negative"  . BREAST BIOPSY Left 06/2017   "positive for cancer"  . BREAST LUMPECTOMY WITH RADIOACTIVE SEED LOCALIZATION Right 08/15/2017   Procedure: RIGHT BREAST LUMPECTOMY WITH RADIOACTIVE SEED LOCALIZATION;  Surgeon: Jovita Kussmaul, MD;  Location: [facility];  Service:  General;  Laterality: Right;  . CARDIAC CATHETERIZATION  2015  . CARDIOVERSION N/A 11/12/2013   Procedure: CARDIOVERSION;  Surgeon: Thayer Headings, MD;  Location[facility] ENDOSCOPY;  Service: Cardiovascular;  Laterality: N/A;  10:08  Dr. Marissa Nestle, anesthesia present, [place]   60mg ,  propofol 50mg , IV for elective cardioversion....Dr. Cathie Olden delievered synch 120 joules with successful cardioversion to NSR  . CARDIOVERSION N/A 11/12/2013   Procedure: CARDIOVERSION;  Surgeon: Thayer Headings, MD;  Location: [facility];  Service: Cardiovascular;  Laterality: N/A;  . CARDIOVERSION N/A 11/14/2013   Procedure: CARDIOVERSION (BEDSIDE);  Surgeon: Larey Dresser, MD;  Location: [facility];  Service: Cardiovascular;  Laterality: N/A;  . CARDIOVERSION N/A  02/22/2018   Procedure: CARDIOVERSION;  Surgeon: Sueanne Margarita, MD;  Location: [facility] ENDOSCOPY;  Service: Cardiovascular;  Laterality: N/A;  . CATARACT EXTRACTION W/ INTRAOCULAR LENS  IMPLANT, BILATERAL Bilateral ~ 2016  . COLONOSCOPY  2003, September 2008, April 01, 2011   adenomas 03 and 08, polyp 12, diverticulosis  . COLONOSCOPY WITH PROPOFOL N/A 11/16/2016   Procedure: COLONOSCOPY WITH PROPOFOL;  Surgeon: Gatha Mayer, MD;  Location: [facility] ENDOSCOPY;  Service: Endoscopy;  Laterality: N/A;  . JOINT REPLACEMENT    . LAPAROSCOPIC CHOLECYSTECTOMY  2004  . LEFT AND RIGHT HEART CATHETERIZATION WITH CORONARY ANGIOGRAM N/A 02/01/2014   Procedure: LEFT AND RIGHT HEART CATHETERIZATION WITH CORONARY ANGIOGRAM;  Surgeon: Sueanne Margarita, MD;  Location: [facility] CATH LAB;  Service: Cardiovascular;  Laterality: N/A;  . LUMBAR LAMINECTOMY/DECOMPRESSION MICRODISCECTOMY N/A 03/19/2014   Procedure: LUMBAR LAMINECTOMY/DECOMPRESSION MICRODISCECTOMY 4 LEVEL;  Surgeon: Kristeen Miss, MD;  Location: [facility] NEURO ORS;  Service: Neurosurgery;  Laterality: N/A;  L1-2 L2-3 L3-4 L4-5 Laminectomy/Foraminotomy  . MASS EXCISION Left 08/15/2017   Procedure: EXCISION OF LEFT BREAST MASS;  Surgeon: Jovita Kussmaul, MD;  Location: [place] [facility];  Service: General;  Laterality: Left;  Marland Kitchen MASTECTOMY Left 10/27/2017  . MASTECTOMY PARTIAL / LUMPECTOMY W/ AXILLARY LYMPHADENECTOMY  05/08/2001   Archie Endo 03/09/2011  . PORT-A-CATH REMOVAL  2003  . PORTA CATH INSERTION  2002  . TEE WITHOUT CARDIOVERSION N/A 11/12/2013   Procedure: TRANSESOPHAGEAL ECHOCARDIOGRAM (TEE);  Surgeon: Thayer Headings, MD;  Location: [facility];  Service: Cardiovascular;  Laterality: N/A;  pt b/p low, pt buccal membranes very dry, lips scapped, pt c/o thirst. NPO since MN and iv FLUIDS TOTAL INFUSING AT TOTAL 20ML HR....  Dr. Cathie Olden order allow NS to bolus during procedure....very dry,NS bolus 250 ml total..pt responding well to meds..  . TOTAL KNEE ARTHROPLASTY Left 2006  . TOTAL KNEE  ARTHROPLASTY Right 05/10/2016   Procedure: RIGHT TOTAL KNEE ARTHROPLASTY;  Surgeon: Gaynelle Arabian, MD;  Location: [facility] ORS;  Service: Orthopedics;  Laterality: Right;  With adductor block  . TOTAL MASTECTOMY Left 10/27/2017   Procedure: LEFT MASTECTOMY;  Surgeon: Jovita Kussmaul, MD;  Location: [facility];  Service: General;  Laterality: Left;  . TUBAL LIGATION      Current Medications: Current Meds  Medication Sig  . amoxicillin (AMOXIL) 500 MG capsule Take 2,000 mg by mouth See admin instructions. Take 2000 mg by mouth 1 hour prior to dental appointment  . atorvastatin (LIPITOR) 40 MG tablet TAKE 1 TABLET (40 MG TOTAL) BY MOUTH AT BEDTIME.  Marland Kitchen Cholecalciferol (VITAMIN D3) 2000 units capsule Take 2,000 Units by mouth daily.  . clotrimazole-betamethasone (LOTRISONE) cream Apply 1 application topically 2 (two) times daily.  . fexofenadine (ALLEGRA) 180 MG tablet Take 180 mg by mouth daily as needed for allergies or rhinitis.  Marland Kitchen  fluconazole (DIFLUCAN) 150 MG tablet Take 1 tablet (150 mg total) by mouth once a week.  . fluticasone (FLONASE) 50 MCG/ACT nasal spray Place 1 spray into both nostrils daily as needed for allergies.   . furosemide (LASIX) 20 MG tablet Take 1 tablet (20 mg total) by mouth 2 (two) times daily.  Marland Kitchen glucose blood test strip Use to check CBG AC and QHS.  Marland Kitchen KLOR-CON M20 20 MEQ tablet Take 1 tablet (20 mEq total) by mouth daily.  Marland Kitchen letrozole (FEMARA) 2.5 MG tablet Take 1 tablet (2.5 mg total) by mouth daily.  Marland Kitchen losartan (COZAAR) 25 MG tablet TAKE 1 TABLET BY MOUTH TWICE A DAY  . metFORMIN (GLUCOPHAGE) 500 MG tablet TAKE 1 TABLET TWICE A DAY WITH A MEAL  . nebivolol (BYSTOLIC) 5 MG tablet Take 1 tablet (5 mg total) by mouth daily.  Marland Kitchen spironolactone (ALDACTONE) 25 MG tablet Take 1 tablet (25 mg total) by mouth daily.  . traMADol (ULTRAM) 50 MG tablet Take 1-2 tablets (50-100 mg total) by mouth every 6 (six) hours as needed. (Patient taking differently: Take 50-100 mg by mouth every 6 (six)  hours as needed for moderate pain. )  . XARELTO 20 MG TABS tablet TAKE 1 TABLET EVERY DAY WITH SUPPER  . [DISCONTINUED] nebivolol (BYSTOLIC) 5 MG tablet Take 1 tablet (5 mg total) by mouth daily.  . [DISCONTINUED] nebivolol (BYSTOLIC) 5 MG tablet Take 1.5 tablets (7.5 mg total) by mouth daily.     Allergies:   Ambien [zolpidem]; Codeine phosphate; and Medrol [methylprednisolone]   Social History   Socioeconomic History  . Marital status: Widowed    Spouse name: Not on file  . Number of children: 2  . Years of education: Not on file  . Highest education level: Not on file  Occupational History  . Occupation: retired  Scientific laboratory technician  . Financial resource strain: Not on file  . Food insecurity:    Worry: Not on file    Inability: Not on file  . Transportation needs:    Medical: Not on file    Non-medical: Not on file  Tobacco Use  . Smoking status: Former Smoker    Packs/day: 1.50    Years: 35.00    Pack years: 52.50    Types: Cigarettes    Last attempt to quit: 08/24/2001    Years since quitting: 16.5  . Smokeless tobacco: Never Used  Substance and Sexual Activity  . Alcohol use: No  . Drug use: No  . Sexual activity: Never    Birth control/protection: Post-menopausal  Lifestyle  . Physical activity:    Days per week: Not on file    Minutes per session: Not on file  . Stress: Not on file  Relationships  . Social connections:    Talks on phone: Not on file    Gets together: Not on file    Attends religious service: Not on file    Active member of club or organization: Not on file    Attends meetings of clubs or organizations: Not on file    Relationship status: Not on file  Other Topics Concern  . Not on file  Social History Narrative  . Not on file     Family History:  The patient's family history includes Alzheimer's disease in her sister; Dementia in her sister; Diabetes in her brother, father, mother, and sister; Heart disease in her brother and father;  Irritable bowel syndrome in her brother; Liver cancer in her mother; Pancreatic  cancer in her mother.   ROS:   Please see the history of present illness.    Review of Systems  Constitution: Negative.  HENT: Negative.   Eyes: Negative.   Cardiovascular: Positive for dyspnea on exertion, irregular heartbeat and palpitations.  Respiratory: Negative.   Hematologic/Lymphatic: Negative.   Musculoskeletal: Negative.  Negative for joint pain.  Gastrointestinal: Negative.   Genitourinary: Negative.   Neurological: Negative.    All other systems reviewed and are negative.   PHYSICAL EXAM:   VS:  BP 132/68   Pulse 67   Ht 5\' 6"  (1.676 m)   Wt 247 lb (112 kg)   SpO2 97%   BMI 39.87 kg/m   Physical Exam  GEN: Obese, in no acute distress  Neck: no JVD, carotid bruits, or masses Cardiac: Irregular irregular at 100 bpm Respiratory:  clear to auscultation bilaterally, normal work of breathing GI: soft, nontender, nondistended, + BS Ext: without cyanosis, clubbing, or edema, Good distal pulses bilaterally Neuro:  Alert and Oriented x 3 Psych: euthymic mood, full affect  Wt Readings from Last 3 Encounters:  03/06/18 247 lb (112 kg)  02/20/18 250 lb (113.4 kg)  02/07/18 248 lb 1.6 oz (112.5 kg)      Studies/Labs Reviewed:   EKG:  EKG is  ordered today.  The ekg ordered today demonstrates atrial fibrillation at 107 bpm  Recent Labs: 01/02/2018: Hemoglobin 12.5; Platelets 335 02/20/2018: BUN 15; Creatinine, Ser 1.10; Potassium 4.4; Sodium 141 03/03/2018: ALT 12   Lipid Panel    Component Value Date/Time   CHOL 121 03/03/2018 0947   TRIG 128 03/03/2018 0947   HDL 39 (L) 03/03/2018 0947   CHOLHDL 3.1 03/03/2018 0947   CHOLHDL 3 02/03/2017 1207   VLDL 23.0 02/03/2017 1207   LDLCALC 56 03/03/2018 0947    Additional studies/ records that were reviewed today include:   2D echo 5/10/2019Study Conclusions   - Left ventricle: The cavity size was mildly dilated. Wall   thickness was  increased in a pattern of mild LVH. Indeterminant   diastolic function (atrial fibrillation). The estimated ejection   fraction was 25%. Diffuse hypokinesis. - Aortic valve: There was no stenosis. - Mitral valve: Mildly calcified annulus. There was mild   regurgitation. - Left atrium: The atrium was mildly dilated. - Right ventricle: The cavity size was normal. Systolic function   was normal. - Right atrium: The atrium was mildly dilated. - Tricuspid valve: Peak RV-RA gradient (S): 26 mm Hg. - Pulmonary arteries: PA peak pressure: 29 mm Hg (S). - Inferior vena cava: The vessel was normal in size. The   respirophasic diameter changes were in the normal range (>= 50%),   consistent with normal central venous pressure. - Pericardium, extracardiac: A trivial pericardial effusion was   identified.   Impressions:   - The patient was in atrial fibrillation. Mildly dilated LV with   mild LV hypertrophy. EF 25%, diffuse hypokinesis. Normal RV size   and systolic function. Mild MR.    ASSESSMENT:    1. Persistent atrial fibrillation ([place])   2. DCM (dilated cardiomyopathy) ([place])   3. Chronic combined systolic and diastolic CHF (congestive heart failure) ([place])   4. Essential hypertension   5. Chronic anticoagulation- Xarelto      PLAN:  In order of problems listed above:  Persistent atrial fibrillation underwent cardioversion 02/22/2018 and back in atrial fibrillation now with tachycardia mediated dilated cardiomyopathy ejection fraction 25%.  Discussed with Dr. Radford Pax.  Increase Bystolic to 5  mg once daily and refer to A. fib clinic.  Recommend she put breast reduction surgery on hold until this gets straightened out.  Dilated cardiomyopathy ejection fraction 25% on echo 03/03/2018.  Patient is having shortness of breath with little activity but no evidence of heart failure on exam today.  2 g sodium diet given.  Dr. Radford Pax feels like she needs to be in normal sinus rhythm.  Chronic  systolic and diastolic CHF currently stable  Essential hypertension stable  Chronic anticoagulation with Xarelto.  She has not missed any doses.    Medication Adjustments/Labs and Tests Ordered: Current medicines are reviewed at length with the patient today.  Concerns regarding medicines are outlined above.  Medication changes, Labs and Tests ordered today are listed in the Patient Instructions below. Patient Instructions  Medication Instructions:  Your physician has recommended you make the following change in your medication:  1-INCREASE Bystolic 5 mg (1 tablet) by mouth daily  Labwork: NONE  Testing/Procedures: NONE  Follow-Up: Your physician recommends that you schedule a follow-up appointment with [facility] on 03/13/18 at 9:30 am.    If you need a refill on your cardiac medications before your next appointment, please call your pharmacy.    Low-Sodium Eating Plan Sodium, which is an element that makes up salt, helps you maintain a healthy balance of fluids in your body. Too much sodium can increase your blood pressure and cause fluid and waste to be held in your body. Your health care provider or dietitian may recommend following this plan if you have high blood pressure (hypertension), kidney disease, liver disease, or heart failure. Eating less sodium can help lower your blood pressure, reduce swelling, and protect your heart, liver, and kidneys. What are tips for following this plan? General guidelines  Most people on this plan should limit their sodium intake to 1,500-2,000 mg (milligrams) of sodium each day. Reading food labels  The Nutrition Facts label lists the amount of sodium in one serving of the food. If you eat more than one serving, you must multiply the listed amount of sodium by the number of servings.  Choose foods with less than 140 mg of sodium per serving.  Avoid foods with 300 mg of sodium or more per serving. Shopping  Look for  lower-sodium products, often labeled as "low-sodium" or "no salt added."  Always check the sodium content even if foods are labeled as "unsalted" or "no salt added".  Buy fresh foods. ? Avoid canned foods and premade or frozen meals. ? Avoid canned, cured, or processed meats  Buy breads that have less than 80 mg of sodium per slice. Cooking  Eat more home-cooked food and less restaurant, buffet, and fast food.  Avoid adding salt when cooking. Use salt-free seasonings or herbs instead of table salt or sea salt. Check with your health care provider or pharmacist before using salt substitutes.  Cook with plant-based oils, such as canola, sunflower, or olive oil. Meal planning  When eating at a restaurant, ask that your food be prepared with less salt or no salt, if possible.  Avoid foods that contain MSG (monosodium glutamate). MSG is sometimes added to [place] food, bouillon, and some canned foods. What foods are recommended? The items listed may not be a complete list. Talk with your dietitian about what dietary choices are best for you. Grains Low-sodium cereals, including oats, puffed wheat and rice, and shredded wheat. Low-sodium crackers. Unsalted rice. Unsalted pasta. Low-sodium bread. Whole-grain breads and whole-grain pasta.  Vegetables Fresh or frozen vegetables. "No salt added" canned vegetables. "No salt added" tomato sauce and paste. Low-sodium or reduced-sodium tomato and vegetable juice. Fruits Fresh, frozen, or canned fruit. Fruit juice. Meats and other protein foods Fresh or frozen (no salt added) meat, poultry, seafood, and fish. Low-sodium canned tuna and salmon. Unsalted nuts. Dried peas, beans, and lentils without added salt. Unsalted canned beans. Eggs. Unsalted nut butters. Dairy Milk. Soy milk. Cheese that is naturally low in sodium, such as ricotta cheese, fresh mozzarella, or Swiss cheese Low-sodium or reduced-sodium cheese. Cream cheese. Yogurt. Fats and  oils Unsalted butter. Unsalted margarine with no trans fat. Vegetable oils such as canola or olive oils. Seasonings and other foods Fresh and dried herbs and spices. Salt-free seasonings. Low-sodium mustard and ketchup. Sodium-free salad dressing. Sodium-free light mayonnaise. Fresh or refrigerated horseradish. Lemon juice. Vinegar. Homemade, reduced-sodium, or low-sodium soups. Unsalted popcorn and pretzels. Low-salt or salt-free chips. What foods are not recommended? The items listed may not be a complete list. Talk with your dietitian about what dietary choices are best for you. Grains Instant hot cereals. Bread stuffing, pancake, and biscuit mixes. Croutons. Seasoned rice or pasta mixes. Noodle soup cups. Boxed or frozen macaroni and cheese. Regular salted crackers. Self-rising flour. Vegetables Sauerkraut, pickled vegetables, and relishes. Olives. [place] fries. Onion rings. Regular canned vegetables (not low-sodium or reduced-sodium). Regular canned tomato sauce and paste (not low-sodium or reduced-sodium). Regular tomato and vegetable juice (not low-sodium or reduced-sodium). Frozen vegetables in sauces. Meats and other protein foods Meat or fish that is salted, canned, smoked, spiced, or pickled. Bacon, ham, sausage, hotdogs, corned beef, chipped beef, packaged lunch meats, salt pork, jerky, pickled herring, anchovies, regular canned tuna, sardines, salted nuts. Dairy Processed cheese and cheese spreads. Cheese curds. Blue cheese. Feta cheese. String cheese. Regular cottage cheese. Buttermilk. Canned milk. Fats and oils Salted butter. Regular margarine. Ghee. Bacon fat. Seasonings and other foods Onion salt, garlic salt, seasoned salt, table salt, and sea salt. Canned and packaged gravies. Worcestershire sauce. Tartar sauce. Barbecue sauce. Teriyaki sauce. Soy sauce, including reduced-sodium. Steak sauce. Fish sauce. Oyster sauce. Cocktail sauce. Horseradish that you find on the shelf.  Regular ketchup and mustard. Meat flavorings and tenderizers. Bouillon cubes. Hot sauce and Tabasco sauce. Premade or packaged marinades. Premade or packaged taco seasonings. Relishes. Regular salad dressings. Salsa. Potato and tortilla chips. Corn chips and puffs. Salted popcorn and pretzels. Canned or dried soups. Pizza. Frozen entrees and pot pies. Summary  Eating less sodium can help lower your blood pressure, reduce swelling, and protect your heart, liver, and kidneys.  Most people on this plan should limit their sodium intake to 1,500-2,000 mg (milligrams) of sodium each day.  Canned, boxed, and frozen foods are high in sodium. Restaurant foods, fast foods, and pizza are also very high in sodium. You also get sodium by adding salt to food.  Try to cook at home, eat more fresh fruits and vegetables, and eat less fast food, canned, processed, or prepared foods. This information is not intended to replace advice given to you by your health care provider. Make sure you discuss any questions you have with your health care provider. Document Released: 04/02/2002 Document Revised: 10/04/2016 Document Reviewed: 10/04/2016 Elsevier Interactive Patient Education  2018 [place], Ermalinda Barrios, [place]  03/06/2018 11:42 AM    [facility] [place], [place], [place]  [address] Phone: [phone]; Fax: [phone]

## 2018-03-06 ENCOUNTER — Encounter: Payer: Self-pay | Admitting: Physician Assistant

## 2018-03-06 ENCOUNTER — Ambulatory Visit: Payer: Medicare Other | Admitting: Physician Assistant

## 2018-03-06 VITALS — BP 132/68 | HR 67 | Ht 66.0 in | Wt 247.0 lb

## 2018-03-06 DIAGNOSIS — I4819 Other persistent atrial fibrillation: Secondary | ICD-10-CM

## 2018-03-06 DIAGNOSIS — Z7901 Long term (current) use of anticoagulants: Secondary | ICD-10-CM | POA: Diagnosis not present

## 2018-03-06 DIAGNOSIS — I420 Dilated cardiomyopathy: Secondary | ICD-10-CM | POA: Diagnosis not present

## 2018-03-06 DIAGNOSIS — I481 Persistent atrial fibrillation: Secondary | ICD-10-CM

## 2018-03-06 DIAGNOSIS — I10 Essential (primary) hypertension: Secondary | ICD-10-CM | POA: Diagnosis not present

## 2018-03-06 DIAGNOSIS — I5042 Chronic combined systolic (congestive) and diastolic (congestive) heart failure: Secondary | ICD-10-CM | POA: Diagnosis not present

## 2018-03-06 MED ORDER — NEBIVOLOL HCL 5 MG PO TABS: 7.5000 mg | ORAL_TABLET | Freq: Every day | ORAL | 3 refills | Status: DC

## 2018-03-06 MED ORDER — NEBIVOLOL HCL 5 MG PO TABS: 5.0000 mg | ORAL_TABLET | Freq: Every day | ORAL | 3 refills | Status: DC

## 2018-03-06 NOTE — Patient Instructions (Addendum)
Medication Instructions:  Your physician has recommended you make the following change in your medication:  1-INCREASE Bystolic 5 mg (1 tablet) by mouth daily  Labwork: NONE  Testing/Procedures: NONE  Follow-Up: Your physician recommends that you schedule a follow-up appointment with [facility] on 03/13/18 at 9:30 am.    If you need a refill on your cardiac medications before your next appointment, please call your pharmacy.    Low-Sodium Eating Plan Sodium, which is an element that makes up salt, helps you maintain a healthy balance of fluids in your body. Too much sodium can increase your blood pressure and cause fluid and waste to be held in your body. Your health care provider or dietitian may recommend following this plan if you have high blood pressure (hypertension), kidney disease, liver disease, or heart failure. Eating less sodium can help lower your blood pressure, reduce swelling, and protect your heart, liver, and kidneys. What are tips for following this plan? General guidelines  Most people on this plan should limit their sodium intake to 1,500-2,000 mg (milligrams) of sodium each day. Reading food labels  The Nutrition Facts label lists the amount of sodium in one serving of the food. If you eat more than one serving, you must multiply the listed amount of sodium by the number of servings.  Choose foods with less than 140 mg of sodium per serving.  Avoid foods with 300 mg of sodium or more per serving. Shopping  Look for lower-sodium products, often labeled as "low-sodium" or "no salt added."  Always check the sodium content even if foods are labeled as "unsalted" or "no salt added".  Buy fresh foods. ? Avoid canned foods and premade or frozen meals. ? Avoid canned, cured, or processed meats  Buy breads that have less than 80 mg of sodium per slice. Cooking  Eat more home-cooked food and less restaurant, buffet, and fast food.  Avoid adding salt  when cooking. Use salt-free seasonings or herbs instead of table salt or sea salt. Check with your health care provider or pharmacist before using salt substitutes.  Cook with plant-based oils, such as canola, sunflower, or olive oil. Meal planning  When eating at a restaurant, ask that your food be prepared with less salt or no salt, if possible.  Avoid foods that contain MSG (monosodium glutamate). MSG is sometimes added to [place] food, bouillon, and some canned foods. What foods are recommended? The items listed may not be a complete list. Talk with your dietitian about what dietary choices are best for you. Grains Low-sodium cereals, including oats, puffed wheat and rice, and shredded wheat. Low-sodium crackers. Unsalted rice. Unsalted pasta. Low-sodium bread. Whole-grain breads and whole-grain pasta. Vegetables Fresh or frozen vegetables. "No salt added" canned vegetables. "No salt added" tomato sauce and paste. Low-sodium or reduced-sodium tomato and vegetable juice. Fruits Fresh, frozen, or canned fruit. Fruit juice. Meats and other protein foods Fresh or frozen (no salt added) meat, poultry, seafood, and fish. Low-sodium canned tuna and salmon. Unsalted nuts. Dried peas, beans, and lentils without added salt. Unsalted canned beans. Eggs. Unsalted nut butters. Dairy Milk. Soy milk. Cheese that is naturally low in sodium, such as ricotta cheese, fresh mozzarella, or Swiss cheese Low-sodium or reduced-sodium cheese. Cream cheese. Yogurt. Fats and oils Unsalted butter. Unsalted margarine with no trans fat. Vegetable oils such as canola or olive oils. Seasonings and other foods Fresh and dried herbs and spices. Salt-free seasonings. Low-sodium mustard and ketchup. Sodium-free salad dressing. Sodium-free light mayonnaise.  Fresh or refrigerated horseradish. Lemon juice. Vinegar. Homemade, reduced-sodium, or low-sodium soups. Unsalted popcorn and pretzels. Low-salt or salt-free chips. What  foods are not recommended? The items listed may not be a complete list. Talk with your dietitian about what dietary choices are best for you. Grains Instant hot cereals. Bread stuffing, pancake, and biscuit mixes. Croutons. Seasoned rice or pasta mixes. Noodle soup cups. Boxed or frozen macaroni and cheese. Regular salted crackers. Self-rising flour. Vegetables Sauerkraut, pickled vegetables, and relishes. Olives. [place] fries. Onion rings. Regular canned vegetables (not low-sodium or reduced-sodium). Regular canned tomato sauce and paste (not low-sodium or reduced-sodium). Regular tomato and vegetable juice (not low-sodium or reduced-sodium). Frozen vegetables in sauces. Meats and other protein foods Meat or fish that is salted, canned, smoked, spiced, or pickled. Bacon, ham, sausage, hotdogs, corned beef, chipped beef, packaged lunch meats, salt pork, jerky, pickled herring, anchovies, regular canned tuna, sardines, salted nuts. Dairy Processed cheese and cheese spreads. Cheese curds. Blue cheese. Feta cheese. String cheese. Regular cottage cheese. Buttermilk. Canned milk. Fats and oils Salted butter. Regular margarine. Ghee. Bacon fat. Seasonings and other foods Onion salt, garlic salt, seasoned salt, table salt, and sea salt. Canned and packaged gravies. Worcestershire sauce. Tartar sauce. Barbecue sauce. Teriyaki sauce. Soy sauce, including reduced-sodium. Steak sauce. Fish sauce. Oyster sauce. Cocktail sauce. Horseradish that you find on the shelf. Regular ketchup and mustard. Meat flavorings and tenderizers. Bouillon cubes. Hot sauce and Tabasco sauce. Premade or packaged marinades. Premade or packaged taco seasonings. Relishes. Regular salad dressings. Salsa. Potato and tortilla chips. Corn chips and puffs. Salted popcorn and pretzels. Canned or dried soups. Pizza. Frozen entrees and pot pies. Summary  Eating less sodium can help lower your blood pressure, reduce swelling, and protect your  heart, liver, and kidneys.  Most people on this plan should limit their sodium intake to 1,500-2,000 mg (milligrams) of sodium each day.  Canned, boxed, and frozen foods are high in sodium. Restaurant foods, fast foods, and pizza are also very high in sodium. You also get sodium by adding salt to food.  Try to cook at home, eat more fresh fruits and vegetables, and eat less fast food, canned, processed, or prepared foods. This information is not intended to replace advice given to you by your health care provider. Make sure you discuss any questions you have with your health care provider. Document Released: 04/02/2002 Document Revised: 10/04/2016 Document Reviewed: 10/04/2016 Elsevier Interactive Patient Education  [employer].

## 2018-03-07 NOTE — Addendum Note (Signed)
Addended by: Rose Phi on: 03/07/2018 03:44 PM   Modules accepted: Orders

## 2018-03-10 ENCOUNTER — Other Ambulatory Visit: Payer: Self-pay | Admitting: Internal Medicine

## 2018-03-13 ENCOUNTER — Other Ambulatory Visit: Payer: Self-pay

## 2018-03-13 ENCOUNTER — Ambulatory Visit (HOSPITAL_COMMUNITY)
Admission: RE | Admit: 2018-03-13 | Discharge: 2018-03-13 | Disposition: A | Discharge status: Home / Self Care | Payer: Medicare Other | Source: Ambulatory Visit | Attending: Nurse Practitioner | Admitting: Nurse Practitioner

## 2018-03-13 ENCOUNTER — Encounter (HOSPITAL_COMMUNITY): Payer: Self-pay | Admitting: Nurse Practitioner

## 2018-03-13 VITALS — BP 114/64 | HR 111 | Ht 66.0 in | Wt 243.8 lb

## 2018-03-13 DIAGNOSIS — Z79899 Other long term (current) drug therapy: Secondary | ICD-10-CM | POA: Diagnosis not present

## 2018-03-13 DIAGNOSIS — E119 Type 2 diabetes mellitus without complications: Secondary | ICD-10-CM | POA: Insufficient documentation

## 2018-03-13 DIAGNOSIS — Z9889 Other specified postprocedural states: Secondary | ICD-10-CM | POA: Insufficient documentation

## 2018-03-13 DIAGNOSIS — Z7984 Long term (current) use of oral hypoglycemic drugs: Secondary | ICD-10-CM | POA: Insufficient documentation

## 2018-03-13 DIAGNOSIS — M8580 Other specified disorders of bone density and structure, unspecified site: Secondary | ICD-10-CM | POA: Insufficient documentation

## 2018-03-13 DIAGNOSIS — G8929 Other chronic pain: Secondary | ICD-10-CM | POA: Diagnosis not present

## 2018-03-13 DIAGNOSIS — Z833 Family history of diabetes mellitus: Secondary | ICD-10-CM | POA: Insufficient documentation

## 2018-03-13 DIAGNOSIS — Z79891 Long term (current) use of opiate analgesic: Secondary | ICD-10-CM | POA: Insufficient documentation

## 2018-03-13 DIAGNOSIS — Z885 Allergy status to narcotic agent status: Secondary | ICD-10-CM | POA: Insufficient documentation

## 2018-03-13 DIAGNOSIS — K219 Gastro-esophageal reflux disease without esophagitis: Secondary | ICD-10-CM | POA: Insufficient documentation

## 2018-03-13 DIAGNOSIS — Z7901 Long term (current) use of anticoagulants: Secondary | ICD-10-CM | POA: Diagnosis not present

## 2018-03-13 DIAGNOSIS — Z888 Allergy status to other drugs, medicaments and biological substances status: Secondary | ICD-10-CM | POA: Insufficient documentation

## 2018-03-13 DIAGNOSIS — Z853 Personal history of malignant neoplasm of breast: Secondary | ICD-10-CM | POA: Diagnosis not present

## 2018-03-13 DIAGNOSIS — I5022 Chronic systolic (congestive) heart failure: Secondary | ICD-10-CM | POA: Diagnosis not present

## 2018-03-13 DIAGNOSIS — Z820 Family history of epilepsy and other diseases of the nervous system: Secondary | ICD-10-CM | POA: Insufficient documentation

## 2018-03-13 DIAGNOSIS — E785 Hyperlipidemia, unspecified: Secondary | ICD-10-CM | POA: Insufficient documentation

## 2018-03-13 DIAGNOSIS — I4891 Unspecified atrial fibrillation: Secondary | ICD-10-CM

## 2018-03-13 DIAGNOSIS — I420 Dilated cardiomyopathy: Secondary | ICD-10-CM

## 2018-03-13 DIAGNOSIS — J449 Chronic obstructive pulmonary disease, unspecified: Secondary | ICD-10-CM | POA: Insufficient documentation

## 2018-03-13 DIAGNOSIS — Z9012 Acquired absence of left breast and nipple: Secondary | ICD-10-CM | POA: Insufficient documentation

## 2018-03-13 DIAGNOSIS — Z8249 Family history of ischemic heart disease and other diseases of the circulatory system: Secondary | ICD-10-CM | POA: Diagnosis not present

## 2018-03-13 DIAGNOSIS — I4819 Other persistent atrial fibrillation: Secondary | ICD-10-CM

## 2018-03-13 DIAGNOSIS — I481 Persistent atrial fibrillation: Secondary | ICD-10-CM | POA: Insufficient documentation

## 2018-03-13 DIAGNOSIS — I110 Hypertensive heart disease with heart failure: Secondary | ICD-10-CM | POA: Insufficient documentation

## 2018-03-13 DIAGNOSIS — M48061 Spinal stenosis, lumbar region without neurogenic claudication: Secondary | ICD-10-CM | POA: Diagnosis not present

## 2018-03-13 DIAGNOSIS — Z87891 Personal history of nicotine dependence: Secondary | ICD-10-CM | POA: Diagnosis not present

## 2018-03-13 DIAGNOSIS — Z96653 Presence of artificial knee joint, bilateral: Secondary | ICD-10-CM | POA: Diagnosis not present

## 2018-03-13 DIAGNOSIS — Z800 Family history of malignant neoplasm of digestive organs: Secondary | ICD-10-CM | POA: Insufficient documentation

## 2018-03-14 ENCOUNTER — Telehealth: Payer: Self-pay | Admitting: Pharmacist

## 2018-03-14 NOTE — Telephone Encounter (Signed)
Medication list reviewed in anticipation of upcoming Tikosyn initiation. Patient is taking fluconazole once a week, prescribed by her podiatrist, which is QTc prolonging. Would see how much longer patient's course is and if she is able to stop fluconazole. Otherwise no other significant drug interactions.  Patient is anticoagulated on Xarelto 20mg  daily on the appropriate dose, CrCl 59mL/min. Please ensure that patient has not missed any anticoagulation doses in the 3 weeks prior to Tikosyn initiation.   Patient will need to be counseled to avoid use of Benadryl while on Tikosyn and in the 2-3 days prior to Tikosyn initiation.

## 2018-03-14 NOTE — Progress Notes (Signed)
Primary Care Physician: Marletta Lor, MD Referring Physician:Dr. Promyse Ardito is a 72 y.o. female with a h/o Coronary calcifications on Coronary CT with nonobstructive CAD on cath, history of NICM which has improved LVEF 50-55% but now back down to 25%, HTN, dyslipidemia, PAF S/P TEE/DCCV but failed to hold NSR, repeat cardioversion 11/14/16 after more Amio load, but didn't convert, but converted later that day spontaneouly. Amio stopped secondary to severe dizziness in 2018.Marland Kitchen Last saw Dr. Radford Pax 02/20/18 and was back in Afib with RVR. She was on Xarelto and Bystolic increased. She underwent DCCV 02/22/18.  Patient was referred back to [place] by Dr. Marla Roe who plans to do breast reduction surgery on her 03/22/18 for recurrent Afib with RVR.   Patient comes in today and says she's going in/out of Afib. Gets short of breath with exertion-coming in here today. Goes away with rest.  2D echo on 03/03/2018 patient was back in atrial fibrillation and LVEF is now 25%.  She is being seen in the afib clinic for options to restore SR. She is afib today. Wants to put off breast reduction surgery until her heart rhythm is addressed. She does not drink alcohol,caffeine.  Today, she denies symptoms of palpitations, chest pain, shortness of breath, orthopnea, PND, lower extremity edema, dizziness, presyncope, syncope, or neurologic sequela. The patient is tolerating medications without difficulties and is otherwise without complaint today.   Past Medical History:  Diagnosis Date  . Arthritis   . Breast cancer, left breast ([place]) 2002   "then treated w/chemo and radiation"  . Breast cancer, left breast ([place]) 07/14/2017   "tx'd w/mastectomy"  . Chronic back pain    h/o lumbar stenosis; "no problem since my OR" (`10/27/2017)  . Chronic systolic CHF (congestive heart failure) (HCC)    takes Furosemide and Aldactone daily  . Complication of anesthesia    last surgery iv med when going to  sleep"burned as injected"  . COPD (chronic obstructive pulmonary disease) (HCC)    no inhalers   . DCM (dilated cardiomyopathy) ([place])    EF 15-20% ? tachycardia induced - EF 50-55% on echo 2015  . Dyspnea    with exertion  . Dysrhythmia    afib  . Gallstones   . GERD (gastroesophageal reflux disease)    once in a while;depends on what she eats  . History of bronchitis    "chronic when I smoked; no problem since I quit in 2002" (10/27/2017)  . Hyperlipidemia   . Hypertension    takes Losartan and Metoprolol.   . Joint pain   . Muscle spasm    takes Flexeril daily as needed   . Nocturia   . Osteopenia   . Persistent atrial fibrillation ([place])    s/p TEE DCCV and repeat DCCV 11/14/2013  . Personal history of chemotherapy   . Personal history of colonic polyps    adenomas 03 and 08  . Personal history of radiation therapy   . Pneumonia    hx  . Seasonal allergies   . Thyroid nodule   . Type II diabetes mellitus ([place])   . Vitamin D deficiency    takes Vit D   Past Surgical History:  Procedure Laterality Date  . AXILLARY LYMPH NODE DISSECTION Left 10/2001   persistent intramammary node Archie Endo 03/09/2011  . BACK SURGERY    . BREAST BIOPSY Left 2002  . BREAST BIOPSY Right 06/2017   "node bx was negative"  . BREAST BIOPSY  Left 06/2017   "positive for cancer"  . BREAST LUMPECTOMY WITH RADIOACTIVE SEED LOCALIZATION Right 08/15/2017   Procedure: RIGHT BREAST LUMPECTOMY WITH RADIOACTIVE SEED LOCALIZATION;  Surgeon: Jovita Kussmaul, MD;  Location: [place];  Service: General;  Laterality: Right;  . CARDIAC CATHETERIZATION  2015  . CARDIOVERSION N/A 11/12/2013   Procedure: CARDIOVERSION;  Surgeon: Thayer Headings, MD;  [facility] ENDOSCOPY;  Service: Cardiovascular;  Laterality: N/A;  10:08  Dr. Marissa Nestle, anesthesia present, [place]   60mg ,  propofol 50mg , IV for elective cardioversion....Dr. Cathie Olden delievered synch 120 joules with successful cardioversion to NSR  . CARDIOVERSION N/A  11/12/2013   Procedure: CARDIOVERSION;  Surgeon: Thayer Headings, MD;  Location: [place];  Service: Cardiovascular;  Laterality: N/A;  . CARDIOVERSION N/A 11/14/2013   Procedure: CARDIOVERSION (BEDSIDE);  Surgeon: Larey Dresser, MD;  Location: [place];  Service: Cardiovascular;  Laterality: N/A;  . CARDIOVERSION N/A 02/22/2018   Procedure: CARDIOVERSION;  Surgeon: Sueanne Margarita, MD;  Location[facility] ENDOSCOPY;  Service: Cardiovascular;  Laterality: N/A;  . CATARACT EXTRACTION W/ INTRAOCULAR LENS  IMPLANT, BILATERAL Bilateral ~ 2016  . COLONOSCOPY  2003, September 2008, April 01, 2011   adenomas 03 and 08, polyp 12, diverticulosis  . COLONOSCOPY WITH PROPOFOL N/A 11/16/2016   Procedure: COLONOSCOPY WITH PROPOFOL;  Surgeon: Gatha Mayer, MD;  Location: [facility] ENDOSCOPY;  Service: Endoscopy;  Laterality: N/A;  . JOINT REPLACEMENT    . LAPAROSCOPIC CHOLECYSTECTOMY  2004  . LEFT AND RIGHT HEART CATHETERIZATION WITH CORONARY ANGIOGRAM N/A 02/01/2014   Procedure: LEFT AND RIGHT HEART CATHETERIZATION WITH CORONARY ANGIOGRAM;  Surgeon: Sueanne Margarita, MD;  Location: [place] CATH LAB;  Service: Cardiovascular;  Laterality: N/A;  . LUMBAR LAMINECTOMY/DECOMPRESSION MICRODISCECTOMY N/A 03/19/2014   Procedure: LUMBAR LAMINECTOMY/DECOMPRESSION MICRODISCECTOMY 4 LEVEL;  Surgeon: Kristeen Miss, MD;  Location: [place] NEURO ORS;  Service: Neurosurgery;  Laterality: N/A;  L1-2 L2-3 L3-4 L4-5 Laminectomy/Foraminotomy  . MASS EXCISION Left 08/15/2017   Procedure: EXCISION OF LEFT BREAST MASS;  Surgeon: Jovita Kussmaul, MD;  Location: [place];  Service: General;  Laterality: Left;  Marland Kitchen MASTECTOMY Left 10/27/2017  . MASTECTOMY PARTIAL / LUMPECTOMY W/ AXILLARY LYMPHADENECTOMY  05/08/2001   Archie Endo 03/09/2011  . PORT-A-CATH REMOVAL  2003  . PORTA CATH INSERTION  2002  . TEE WITHOUT CARDIOVERSION N/A 11/12/2013   Procedure: TRANSESOPHAGEAL ECHOCARDIOGRAM (TEE);  Surgeon: Thayer Headings, MD;  Location: [place];  Service: Cardiovascular;   Laterality: N/A;  pt b/p low, pt buccal membranes very dry, lips scapped, pt c/o thirst. NPO since MN and iv FLUIDS TOTAL INFUSING AT TOTAL 20ML HR....  Dr. Cathie Olden order allow NS to bolus during procedure....very dry,NS bolus 250 ml total..pt responding well to meds..  . TOTAL KNEE ARTHROPLASTY Left 2006  . TOTAL KNEE ARTHROPLASTY Right 05/10/2016   Procedure: RIGHT TOTAL KNEE ARTHROPLASTY;  Surgeon: Gaynelle Arabian, MD;  Location: [facility] ORS;  Service: Orthopedics;  Laterality: Right;  With adductor block  . TOTAL MASTECTOMY Left 10/27/2017   Procedure: LEFT MASTECTOMY;  Surgeon: Jovita Kussmaul, MD;  Location: [place];  Service: General;  Laterality: Left;  . TUBAL LIGATION      Current Outpatient Medications  Medication Sig Dispense Refill  . amoxicillin (AMOXIL) 500 MG capsule Take 2,000 mg by mouth See admin instructions. Take 2000 mg by mouth 1 hour prior to dental appointment  5  . atorvastatin (LIPITOR) 40 MG tablet TAKE 1 TABLET (40 MG TOTAL) BY MOUTH AT BEDTIME. 90 tablet 3  .  Cholecalciferol (VITAMIN D3) 2000 units capsule Take 2,000 Units by mouth daily.    . clotrimazole-betamethasone (LOTRISONE) cream Apply 1 application topically 2 (two) times daily. 30 g 0  . fexofenadine (ALLEGRA) 180 MG tablet Take 180 mg by mouth daily as needed for allergies or rhinitis.    . fluconazole (DIFLUCAN) 150 MG tablet Take 1 tablet (150 mg total) by mouth once a week. 6 tablet 1  . fluticasone (FLONASE) 50 MCG/ACT nasal spray Place 1 spray into both nostrils daily as needed for allergies.     . furosemide (LASIX) 20 MG tablet Take 1 tablet (20 mg total) by mouth 2 (two) times daily. 180 tablet 3  . KLOR-CON M20 20 MEQ tablet Take 1 tablet (20 mEq total) by mouth daily. 90 tablet 3  . letrozole (FEMARA) 2.5 MG tablet Take 1 tablet (2.5 mg total) by mouth daily. 90 tablet 3  . losartan (COZAAR) 25 MG tablet TAKE 1 TABLET BY MOUTH TWICE A DAY 180 tablet 3  . metFORMIN (GLUCOPHAGE) 500 MG tablet TAKE 1 TABLET  TWICE A DAY WITH A MEAL 180 tablet 1  . nebivolol (BYSTOLIC) 5 MG tablet Take 1 tablet (5 mg total) by mouth daily. 90 tablet 3  . spironolactone (ALDACTONE) 25 MG tablet Take 1 tablet (25 mg total) by mouth daily. 90 tablet 3  . traMADol (ULTRAM) 50 MG tablet Take 1-2 tablets (50-100 mg total) by mouth every 6 (six) hours as needed. (Patient taking differently: Take 50-100 mg by mouth every 6 (six) hours as needed for moderate pain. ) 20 tablet 0  . XARELTO 20 MG TABS tablet TAKE 1 TABLET EVERY DAY WITH SUPPER 30 tablet 6  . glucose blood test strip Use to check CBG AC and QHS. 200 each 12   No current facility-administered medications for this encounter.     Allergies  Allergen Reactions  . Ambien [Zolpidem] Other (See Comments)    Hallucinations, up walking around  . Codeine Phosphate Nausea And Vomiting  . Medrol [Methylprednisolone] Other (See Comments)    Felt really weird w the high dose oral steroid, but tolerates low doses or oral steroid    Social History   Socioeconomic History  . Marital status: Widowed    Spouse name: Not on file  . Number of children: 2  . Years of education: Not on file  . Highest education level: Not on file  Occupational History  . Occupation: retired  Scientific laboratory technician  . Financial resource strain: Not on file  . Food insecurity:    Worry: Not on file    Inability: Not on file  . Transportation needs:    Medical: Not on file    Non-medical: Not on file  Tobacco Use  . Smoking status: Former Smoker    Packs/day: 1.50    Years: 35.00    Pack years: 52.50    Types: Cigarettes    Last attempt to quit: 08/24/2001    Years since quitting: 16.5  . Smokeless tobacco: Never Used  Substance and Sexual Activity  . Alcohol use: No  . Drug use: No  . Sexual activity: Never    Birth control/protection: Post-menopausal  Lifestyle  . Physical activity:    Days per week: Not on file    Minutes per session: Not on file  . Stress: Not on file    Relationships  . Social connections:    Talks on phone: Not on file    Gets together: Not on file  Attends religious service: Not on file    Active member of club or organization: Not on file    Attends meetings of clubs or organizations: Not on file    Relationship status: Not on file  . Intimate partner violence:    Fear of current or ex partner: Not on file    Emotionally abused: Not on file    Physically abused: Not on file    Forced sexual activity: Not on file  Other Topics Concern  . Not on file  Social History Narrative  . Not on file    Family History  Problem Relation Age of Onset  . Dementia Sister   . Diabetes Sister   . Alzheimer's disease Sister   . Diabetes Brother        x 2  . Heart disease Brother   . Irritable bowel syndrome Brother   . Liver cancer Mother   . Diabetes Mother   . Pancreatic cancer Mother   . Diabetes Father   . Heart disease Father     ROS- All systems are reviewed and negative except as per the HPI above  Physical Exam: Vitals:   03/13/18 0935  BP: 114/64  Pulse: (!) 111  Weight: 243 lb 12.8 oz (110.6 kg)  Height: 5\' 6"  (1.676 m)   Wt Readings from Last 3 Encounters:  03/13/18 243 lb 12.8 oz (110.6 kg)  03/06/18 247 lb (112 kg)  02/20/18 250 lb (113.4 kg)    Labs: Lab Results  Component Value Date   NA 141 02/20/2018   K 4.4 02/20/2018   CL 103 02/20/2018   CO2 23 02/20/2018   GLUCOSE 149 (H) 02/20/2018   BUN 15 02/20/2018   CREATININE 1.10 (H) 02/20/2018   CALCIUM 9.8 02/20/2018   Lab Results  Component Value Date   INR 1.10 01/02/2018   Lab Results  Component Value Date   CHOL 121 03/03/2018   HDL 39 (L) 03/03/2018   LDLCALC 56 03/03/2018   TRIG 128 03/03/2018     GEN- The patient is well appearing, alert and oriented x 3 today.   Head- normocephalic, atraumatic Eyes-  Sclera clear, conjunctiva pink Ears- hearing intact Oropharynx- clear Neck- supple, no JVP Lymph- no cervical  lymphadenopathy Lungs- Clear to ausculation bilaterally, normal work of breathing Heart-irregular rate and rhythm, no murmurs, rubs or gallops, PMI not laterally displaced GI- soft, NT, ND, + BS Extremities- no clubbing, cyanosis, or edema MS- no significant deformity or atrophy Skin- no rash or lesion Psych- euthymic mood, full affect Neuro- strength and sensation are intact  EKG-afib at 111 bpm, qrs int 92 ms, qtc at 492 ms Epic records reviewed Echo-Study Conclusions  - Left ventricle: The cavity size was mildly dilated. Wall   thickness was increased in a pattern of mild LVH. Indeterminant   diastolic function (atrial fibrillation). The estimated ejection   fraction was 25%. Diffuse hypokinesis. - Aortic valve: There was no stenosis. - Mitral valve: Mildly calcified annulus. There was mild   regurgitation. - Left atrium: The atrium was mildly dilated. - Right ventricle: The cavity size was normal. Systolic function   was normal. - Right atrium: The atrium was mildly dilated. - Tricuspid valve: Peak RV-RA gradient (S): 26 mm Hg. - Pulmonary arteries: PA peak pressure: 29 mm Hg (S). - Inferior vena cava: The vessel was normal in size. The   respirophasic diameter changes were in the normal range (>= 50%),   consistent with normal central venous pressure. -  Pericardium, extracardiac: A trivial pericardial effusion was   identified.  Impressions:  - The patient was in atrial fibrillation. Mildly dilated LV with   mild LV hypertrophy. EF 25%, diffuse hypokinesis. Normal RV size   and systolic function. Mild MR.  ------------------------------------------------------------------- Study data:  Comparison was made to the study of 08/26/2014.  Study status:  Routine.  Procedure:  The patient reported no pain pre or post test. Transthoracic echocardiography for left ventricular function evaluation. Image quality was adequate.  Study completion:  There were no complications.           Transthoracic echocardiography.  M-mode, complete 2D, spectral Doppler, and color Doppler.  Birthdate:  Patient birthdate: 30-Apr-1946.  Age:  Patient is 72 yr old.  Sex:  Gender: female.    BMI: 40.4 kg/m^2.  Blood pressure:     126/64  Patient status:  Outpatient.  Study date: Study date: 03/03/2018. Study time: 10:20 AM.  Location:  [facility]  -------------------------------------------------------------------  ------------------------------------------------------------------- Left ventricle:  The cavity size was mildly dilated. Wall thickness was increased in a pattern of mild LVH. Indeterminant diastolic function (atrial fibrillation). The estimated ejection fraction was 25%. Diffuse hypokinesis.  ------------------------------------------------------------------- Aortic valve:   Trileaflet; mildly calcified leaflets.  Doppler: There was no stenosis.   There was no regurgitation.  ------------------------------------------------------------------- Aorta:  Aortic root: The aortic root was normal in size. Ascending aorta: The ascending aorta was normal in size.  ------------------------------------------------------------------- Mitral valve:   Mildly calcified annulus.  Doppler:   There was no evidence for stenosis.   There was mild regurgitation.    Peak gradient (D): 8 mm Hg.  ------------------------------------------------------------------- Left atrium:  The atrium was mildly dilated.  ------------------------------------------------------------------- Right ventricle:  The cavity size was normal. Systolic function was normal.  ------------------------------------------------------------------- Pulmonic valve:    Structurally normal valve.   Cusp separation was normal.  Doppler:  Transvalvular velocity was within the normal range. There was trivial regurgitation.  ------------------------------------------------------------------- Tricuspid  valve:   Doppler:  There was trivial regurgitation.   ------------------------------------------------------------------- Right atrium:  The atrium was mildly dilated.  ------------------------------------------------------------------- Pericardium:  A trivial pericardial effusion was identified.  ------------------------------------------------------------------- Systemic veins: Inferior vena cava: The vessel was normal in size. The respirophasic diameter changes were in the normal range (>= 50%), consistent with normal central venous pressure.  -------------------------------------------------------------------     Assessment and Plan: 1. Persistent  afib Would be most beneficial to the pt to restore SR for EF of 25%, probably TMC Options discussed to restore SSR Amiodarone is out of the picture as she did not tolerate in 2018 With CAD and LV dysfunction, Tikosyn or sotalol her best options Precautions with these drugs discussed No benadryl use PharmD to review drugs QTc in SR was acceptable less than 440 ms Pt will check on price of tikosyn and get back with me regarding her plan Continue xarelto 20 mg a day for CHA2DS2VASc score of 5  Jackson Fetters C. Treyshon Buchanon, [facility] [address] [place], [place] [id number] [phone]

## 2018-03-15 ENCOUNTER — Other Ambulatory Visit (HOSPITAL_COMMUNITY): Payer: Medicare Other

## 2018-03-15 DIAGNOSIS — C50912 Malignant neoplasm of unspecified site of left female breast: Secondary | ICD-10-CM | POA: Diagnosis not present

## 2018-03-21 ENCOUNTER — Other Ambulatory Visit: Payer: Self-pay

## 2018-03-21 ENCOUNTER — Inpatient Hospital Stay (HOSPITAL_COMMUNITY)
Admission: AD | Admit: 2018-03-21 | Discharge: 2018-03-26 | DRG: 309 | Disposition: A | Discharge status: Home / Self Care | Payer: Medicare Other | Source: Ambulatory Visit | Attending: Internal Medicine | Admitting: Internal Medicine

## 2018-03-21 ENCOUNTER — Ambulatory Visit (HOSPITAL_COMMUNITY)
Admission: RE | Admit: 2018-03-21 | Discharge: 2018-03-21 | Disposition: A | Discharge status: Home / Self Care | Payer: Medicare Other | Source: Ambulatory Visit | Attending: Nurse Practitioner | Admitting: Nurse Practitioner

## 2018-03-21 ENCOUNTER — Encounter (HOSPITAL_COMMUNITY): Payer: Self-pay | Admitting: Nurse Practitioner

## 2018-03-21 VITALS — BP 114/64 | HR 100 | Ht 66.0 in | Wt 240.0 lb

## 2018-03-21 DIAGNOSIS — Z79899 Other long term (current) drug therapy: Secondary | ICD-10-CM

## 2018-03-21 DIAGNOSIS — Z5181 Encounter for therapeutic drug level monitoring: Secondary | ICD-10-CM

## 2018-03-21 DIAGNOSIS — Z87891 Personal history of nicotine dependence: Secondary | ICD-10-CM

## 2018-03-21 DIAGNOSIS — I10 Essential (primary) hypertension: Secondary | ICD-10-CM | POA: Diagnosis not present

## 2018-03-21 DIAGNOSIS — I5022 Chronic systolic (congestive) heart failure: Secondary | ICD-10-CM | POA: Diagnosis not present

## 2018-03-21 DIAGNOSIS — Z7984 Long term (current) use of oral hypoglycemic drugs: Secondary | ICD-10-CM

## 2018-03-21 DIAGNOSIS — J449 Chronic obstructive pulmonary disease, unspecified: Secondary | ICD-10-CM | POA: Diagnosis not present

## 2018-03-21 DIAGNOSIS — K219 Gastro-esophageal reflux disease without esophagitis: Secondary | ICD-10-CM | POA: Diagnosis not present

## 2018-03-21 DIAGNOSIS — I4819 Other persistent atrial fibrillation: Secondary | ICD-10-CM | POA: Diagnosis present

## 2018-03-21 DIAGNOSIS — Z7901 Long term (current) use of anticoagulants: Secondary | ICD-10-CM

## 2018-03-21 DIAGNOSIS — Z9012 Acquired absence of left breast and nipple: Secondary | ICD-10-CM | POA: Diagnosis not present

## 2018-03-21 DIAGNOSIS — I4581 Long QT syndrome: Secondary | ICD-10-CM | POA: Diagnosis not present

## 2018-03-21 DIAGNOSIS — I110 Hypertensive heart disease with heart failure: Secondary | ICD-10-CM | POA: Diagnosis present

## 2018-03-21 DIAGNOSIS — Z9221 Personal history of antineoplastic chemotherapy: Secondary | ICD-10-CM | POA: Diagnosis not present

## 2018-03-21 DIAGNOSIS — Z853 Personal history of malignant neoplasm of breast: Secondary | ICD-10-CM | POA: Diagnosis not present

## 2018-03-21 DIAGNOSIS — I420 Dilated cardiomyopathy: Secondary | ICD-10-CM | POA: Diagnosis present

## 2018-03-21 DIAGNOSIS — E785 Hyperlipidemia, unspecified: Secondary | ICD-10-CM | POA: Diagnosis present

## 2018-03-21 DIAGNOSIS — Z96653 Presence of artificial knee joint, bilateral: Secondary | ICD-10-CM | POA: Diagnosis not present

## 2018-03-21 DIAGNOSIS — E119 Type 2 diabetes mellitus without complications: Secondary | ICD-10-CM | POA: Diagnosis present

## 2018-03-21 DIAGNOSIS — E663 Overweight: Secondary | ICD-10-CM

## 2018-03-21 DIAGNOSIS — Z923 Personal history of irradiation: Secondary | ICD-10-CM

## 2018-03-21 DIAGNOSIS — I481 Persistent atrial fibrillation: Principal | ICD-10-CM | POA: Diagnosis present

## 2018-03-21 LAB — BASIC METABOLIC PANEL
ANION GAP: 13 (ref 5–15)
BUN: 24 mg/dL — ABNORMAL HIGH (ref 6–20)
CHLORIDE: 103 mmol/L (ref 101–111)
CO2: 21 mmol/L — AB (ref 22–32)
Calcium: 9.9 mg/dL (ref 8.9–10.3)
Creatinine, Ser: 1.37 mg/dL — ABNORMAL HIGH (ref 0.44–1.00)
GFR calc Af Amer: 43 mL/min — ABNORMAL LOW (ref >60)
GFR calc non Af Amer: 38 mL/min — ABNORMAL LOW (ref >60)
GLUCOSE: 144 mg/dL — AB (ref 65–99)
Potassium: 4 mmol/L (ref 3.5–5.1)
Sodium: 137 mmol/L (ref 135–145)

## 2018-03-21 LAB — GLUCOSE, CAPILLARY
GLUCOSE-CAPILLARY: 146 mg/dL — AB (ref 65–99)
Glucose-Capillary: 156 mg/dL — ABNORMAL HIGH (ref 65–99)
Glucose-Capillary: 119 mg/dL — ABNORMAL HIGH (ref 65–99)

## 2018-03-21 LAB — MAGNESIUM: Magnesium: 1.6 mg/dL — ABNORMAL LOW (ref 1.7–2.4)

## 2018-03-21 MED ORDER — DOFETILIDE 500 MCG PO CAPS
500.0000 ug | ORAL_CAPSULE | Freq: Two times a day (BID) | ORAL | Status: DC
  Administered 2018-03-22 (×2): 500 ug via ORAL
  Filled 2018-03-21 (×2): qty 1

## 2018-03-21 MED ORDER — SODIUM CHLORIDE 0.9% FLUSH
3.0000 mL | Freq: Two times a day (BID) | INTRAVENOUS | Status: DC
  Administered 2018-03-21 – 2018-03-24 (×4): 3 mL via INTRAVENOUS

## 2018-03-21 MED ORDER — CLOTRIMAZOLE-BETAMETHASONE 1-0.05 % EX CREA
1.0000 "application " | TOPICAL_CREAM | Freq: Two times a day (BID) | CUTANEOUS | Status: DC
  Administered 2018-03-21 – 2018-03-26 (×10): 1 via TOPICAL
  Filled 2018-03-21: qty 15

## 2018-03-21 MED ORDER — SODIUM CHLORIDE 0.9 % IV SOLN: 250.0000 mL | INTRAVENOUS | Status: DC | PRN

## 2018-03-21 MED ORDER — LOSARTAN POTASSIUM 25 MG PO TABS
25.0000 mg | ORAL_TABLET | Freq: Two times a day (BID) | ORAL | Status: DC
  Administered 2018-03-21 – 2018-03-26 (×10): 25 mg via ORAL
  Filled 2018-03-21 (×10): qty 1

## 2018-03-21 MED ORDER — POTASSIUM CHLORIDE CRYS ER 20 MEQ PO TBCR
20.0000 meq | EXTENDED_RELEASE_TABLET | Freq: Every day | ORAL | Status: DC
Start: 2018-03-21 — End: 2018-03-26
  Administered 2018-03-21 – 2018-03-26 (×6): 20 meq via ORAL
  Filled 2018-03-21 (×7): qty 1

## 2018-03-21 MED ORDER — RIVAROXABAN 20 MG PO TABS
20.0000 mg | ORAL_TABLET | Freq: Every day | ORAL | Status: DC
  Administered 2018-03-21 – 2018-03-25 (×5): 20 mg via ORAL
  Filled 2018-03-21 (×5): qty 1

## 2018-03-21 MED ORDER — SPIRONOLACTONE 25 MG PO TABS
25.0000 mg | ORAL_TABLET | Freq: Every day | ORAL | Status: DC
  Administered 2018-03-22 – 2018-03-26 (×5): 25 mg via ORAL
  Filled 2018-03-21 (×5): qty 1

## 2018-03-21 MED ORDER — NEBIVOLOL HCL 5 MG PO TABS
5.0000 mg | ORAL_TABLET | Freq: Every day | ORAL | Status: DC
  Administered 2018-03-22 – 2018-03-26 (×5): 5 mg via ORAL
  Filled 2018-03-21 (×5): qty 1

## 2018-03-21 MED ORDER — VITAMIN D 1000 UNITS PO TABS
2000.0000 [IU] | ORAL_TABLET | Freq: Every day | ORAL | Status: DC
  Administered 2018-03-22 – 2018-03-26 (×5): 2000 [IU] via ORAL
  Filled 2018-03-21 (×10): qty 2

## 2018-03-21 MED ORDER — MAGNESIUM SULFATE 4 GM/100ML IV SOLN
4.0000 g | Freq: Once | INTRAVENOUS | Status: AC
  Administered 2018-03-21: 4 g via INTRAVENOUS
  Filled 2018-03-21: qty 100

## 2018-03-21 MED ORDER — FUROSEMIDE 20 MG PO TABS
20.0000 mg | ORAL_TABLET | Freq: Two times a day (BID) | ORAL | Status: DC
  Administered 2018-03-22 – 2018-03-26 (×9): 20 mg via ORAL
  Filled 2018-03-21 (×9): qty 1

## 2018-03-21 MED ORDER — LETROZOLE 2.5 MG PO TABS
2.5000 mg | ORAL_TABLET | Freq: Every day | ORAL | Status: DC
  Administered 2018-03-22 – 2018-03-26 (×5): 2.5 mg via ORAL
  Filled 2018-03-21 (×5): qty 1

## 2018-03-21 MED ORDER — SODIUM CHLORIDE 0.9% FLUSH: 3.0000 mL | INTRAVENOUS | Status: DC | PRN

## 2018-03-21 MED ORDER — DOFETILIDE 500 MCG PO CAPS: 500.0000 ug | ORAL_CAPSULE | Freq: Two times a day (BID) | ORAL | Status: DC

## 2018-03-21 MED ORDER — METFORMIN HCL 500 MG PO TABS
500.0000 mg | ORAL_TABLET | Freq: Two times a day (BID) | ORAL | Status: DC
  Administered 2018-03-21 – 2018-03-26 (×10): 500 mg via ORAL
  Filled 2018-03-21 (×10): qty 1

## 2018-03-21 MED ORDER — ATORVASTATIN CALCIUM 40 MG PO TABS
40.0000 mg | ORAL_TABLET | Freq: Every day | ORAL | Status: DC
  Administered 2018-03-21 – 2018-03-25 (×5): 40 mg via ORAL
  Filled 2018-03-21 (×5): qty 1

## 2018-03-21 NOTE — H&P (Addendum)
Cardiology Admission History and Physical:   Patient ID: Cynthia Herrera; MRN: [id number]; DOB: 23-Jan-1946   Admission date: 03/21/2018  Primary Care Provider: Marletta Lor, MD Primary Cardiologist: Dr. Radford Pax Primary Electrophysiologist:  new  Chief Complaint:  Tikosyn initiation  Patient Profile:   Cynthia Herrera is a 72 y.o. female with a history of non-obstructive CAD, NICM that had improved though down again by last echo (? tacy-mediated), HTN, HLD, chronic CHF (systolic), CBP, COPD, GERD, breast cancer treated, DM  History of Present Illness:   Cynthia Herrera has persistent AFib, historically failed amiodarone 2/2 dizziness, this was stopped back in 2018, she has had recurrent, symptomatic AFib, her EF reduced again with concerns may be tachy-related.  She was pending breaste reduction surgery though delayed 2/2 recurrent rapid Afib, she was seen by Gerrianne Scale, PA, in d/w dr. Radford Pax, felt efforts at rhythm management were needed and referred to the AFib clinic, was decided to pursue Tikosyn initiation.  Outside of her AFib, she is feeling OK.  She reports no missed dosed of her xarelto in the last 6 weeks.  She is in-fact though still on her Fluconazole once weekly regime, she has completed 4 of a 6 week course for foot infection.  She reports she took it last on Friday last week.    We discussed Tikosyn protocol, risks/benefits and she would like to proceed.  She is agreeable to DCCV if needed Thursday   Past Medical History:  Diagnosis Date  . Arthritis   . Breast cancer, left breast ([place]) 2002   "then treated w/chemo and radiation"  . Breast cancer, left breast ([place]) 07/14/2017   "tx'd w/mastectomy"  . Chronic back pain    h/o lumbar stenosis; "no problem since my OR" (`10/27/2017)  . Chronic systolic CHF (congestive heart failure) (HCC)    takes Furosemide and Aldactone daily  . Complication of anesthesia    last surgery iv med when going to sleep"burned as  injected"  . COPD (chronic obstructive pulmonary disease) (HCC)    no inhalers   . DCM (dilated cardiomyopathy) ([place])    EF 15-20% ? tachycardia induced - EF 50-55% on echo 2015  . Dyspnea    with exertion  . Dysrhythmia    afib  . Gallstones   . GERD (gastroesophageal reflux disease)    once in a while;depends on what she eats  . History of bronchitis    "chronic when I smoked; no problem since I quit in 2002" (10/27/2017)  . Hyperlipidemia   . Hypertension    takes Losartan and Metoprolol.   . Joint pain   . Muscle spasm    takes Flexeril daily as needed   . Nocturia   . Osteopenia   . Persistent atrial fibrillation ([place])    s/p TEE DCCV and repeat DCCV 11/14/2013  . Personal history of chemotherapy   . Personal history of colonic polyps    adenomas 03 and 08  . Personal history of radiation therapy   . Pneumonia    hx  . Seasonal allergies   . Thyroid nodule   . Type II diabetes mellitus ([place])   . Vitamin D deficiency    takes Vit D    Past Surgical History:  Procedure Laterality Date  . AXILLARY LYMPH NODE DISSECTION Left 10/2001   persistent intramammary node Archie Endo 03/09/2011  . BACK SURGERY    . BREAST BIOPSY Left 2002  . BREAST BIOPSY Right 06/2017   "node bx was  negative"  . BREAST BIOPSY Left 06/2017   "positive for cancer"  . BREAST LUMPECTOMY WITH RADIOACTIVE SEED LOCALIZATION Right 08/15/2017   Procedure: RIGHT BREAST LUMPECTOMY WITH RADIOACTIVE SEED LOCALIZATION;  Surgeon: Jovita Kussmaul, MD;  Location: [place];  Service: General;  Laterality: Right;  . CARDIAC CATHETERIZATION  2015  . CARDIOVERSION N/A 11/12/2013   Procedure: CARDIOVERSION;  Surgeon: Thayer Headings, MD;  Location: [facility] ENDOSCOPY;  Service: Cardiovascular;  Laterality: N/A;  10:08  Dr. Marissa Nestle, anesthesia present, [place]   60mg ,  propofol 50mg , IV for elective cardioversion....Dr. Cathie Olden delievered synch 120 joules with successful cardioversion to NSR  . CARDIOVERSION N/A 11/12/2013    Procedure: CARDIOVERSION;  Surgeon: Thayer Headings, MD;  Location: [place];  Service: Cardiovascular;  Laterality: N/A;  . CARDIOVERSION N/A 11/14/2013   Procedure: CARDIOVERSION (BEDSIDE);  Surgeon: Larey Dresser, MD;  Location: [place];  Service: Cardiovascular;  Laterality: N/A;  . CARDIOVERSION N/A 02/22/2018   Procedure: CARDIOVERSION;  Surgeon: Sueanne Margarita, MD;  Location: [facility] ENDOSCOPY;  Service: Cardiovascular;  Laterality: N/A;  . CATARACT EXTRACTION W/ INTRAOCULAR LENS  IMPLANT, BILATERAL Bilateral ~ 2016  . COLONOSCOPY  2003, September 2008, April 01, 2011   adenomas 03 and 08, polyp 12, diverticulosis  . COLONOSCOPY WITH PROPOFOL N/A 11/16/2016   Procedure: COLONOSCOPY WITH PROPOFOL;  Surgeon: Gatha Mayer, MD;  Location: [facility] ENDOSCOPY;  Service: Endoscopy;  Laterality: N/A;  . JOINT REPLACEMENT    . LAPAROSCOPIC CHOLECYSTECTOMY  2004  . LEFT AND RIGHT HEART CATHETERIZATION WITH CORONARY ANGIOGRAM N/A 02/01/2014   Procedure: LEFT AND RIGHT HEART CATHETERIZATION WITH CORONARY ANGIOGRAM;  Surgeon: Sueanne Margarita, MD;  Location: [place] CATH LAB;  Service: Cardiovascular;  Laterality: N/A;  . LUMBAR LAMINECTOMY/DECOMPRESSION MICRODISCECTOMY N/A 03/19/2014   Procedure: LUMBAR LAMINECTOMY/DECOMPRESSION MICRODISCECTOMY 4 LEVEL;  Surgeon: Kristeen Miss, MD;  Location: [place] NEURO ORS;  Service: Neurosurgery;  Laterality: N/A;  L1-2 L2-3 L3-4 L4-5 Laminectomy/Foraminotomy  . MASS EXCISION Left 08/15/2017   Procedure: EXCISION OF LEFT BREAST MASS;  Surgeon: Jovita Kussmaul, MD;  Location: [place];  Service: General;  Laterality: Left;  Marland Kitchen MASTECTOMY Left 10/27/2017  . MASTECTOMY PARTIAL / LUMPECTOMY W/ AXILLARY LYMPHADENECTOMY  05/08/2001   Archie Endo 03/09/2011  . PORT-A-CATH REMOVAL  2003  . PORTA CATH INSERTION  2002  . TEE WITHOUT CARDIOVERSION N/A 11/12/2013   Procedure: TRANSESOPHAGEAL ECHOCARDIOGRAM (TEE);  Surgeon: Thayer Headings, MD;  Location: [facility];  Service: Cardiovascular;  Laterality:  N/A;  pt b/p low, pt buccal membranes very dry, lips scapped, pt c/o thirst. NPO since MN and iv FLUIDS TOTAL INFUSING AT TOTAL 20ML HR....  Dr. Cathie Olden order allow NS to bolus during procedure....very dry,NS bolus 250 ml total..pt responding well to meds..  . TOTAL KNEE ARTHROPLASTY Left 2006  . TOTAL KNEE ARTHROPLASTY Right 05/10/2016   Procedure: RIGHT TOTAL KNEE ARTHROPLASTY;  Surgeon: Gaynelle Arabian, MD;  Location: [facility] ORS;  Service: Orthopedics;  Laterality: Right;  With adductor block  . TOTAL MASTECTOMY Left 10/27/2017   Procedure: LEFT MASTECTOMY;  Surgeon: Jovita Kussmaul, MD;  Location: [place];  Service: General;  Laterality: Left;  . TUBAL LIGATION       Medications Prior to Admission: Prior to Admission medications   Medication Sig Start Date End Date Taking? Authorizing Provider  amoxicillin (AMOXIL) 500 MG capsule Take 2,000 mg by mouth See admin instructions. Take 2000 mg by mouth 1 hour prior to dental appointment 10/12/17  Yes [provider]  atorvastatin (LIPITOR) 40 MG tablet TAKE 1 TABLET (40 MG TOTAL) BY MOUTH AT BEDTIME. 03/14/17  Yes Turner, Eber Hong, MD  Cholecalciferol (VITAMIN D3) 2000 units capsule Take 2,000 Units by mouth daily.   Yes [provider]  clotrimazole-betamethasone (LOTRISONE) cream Apply 1 application topically 2 (two) times daily. 02/23/18  Yes Evelina Bucy, DPM  fexofenadine (ALLEGRA) 180 MG tablet Take 180 mg by mouth daily as needed for allergies or rhinitis.   Yes [provider]  fluconazole (DIFLUCAN) 150 MG tablet Take 1 tablet (150 mg total) by mouth once a week. Patient not taking: Reported on 03/21/2018 02/23/18  Yes Evelina Bucy, DPM  fluticasone [facility]) 50 MCG/ACT nasal spray Place 1 spray into both nostrils daily as needed for allergies.    Yes [provider]  furosemide (LASIX) 20 MG tablet Take 1 tablet (20 mg total) by mouth 2 (two) times daily. 09/14/17  Yes Turner, Tressia Miners R, MD  glucose blood test  strip Use to check CBG AC and QHS. 11/01/17  Yes Marletta Lor, MD  KLOR-CON M20 20 MEQ tablet Take 1 tablet (20 mEq total) by mouth daily. 09/27/17  Yes Turner, Eber Hong, MD  letrozole (FEMARA) 2.5 MG tablet Take 1 tablet (2.5 mg total) by mouth daily. 11/14/17  Yes Nicholas Lose, MD  losartan (COZAAR) 25 MG tablet TAKE 1 TABLET BY MOUTH TWICE A DAY 01/17/18  Yes Turner, Eber Hong, MD  metFORMIN (GLUCOPHAGE) 500 MG tablet TAKE 1 TABLET TWICE A DAY WITH A MEAL 03/14/18  Yes Marletta Lor, MD  nebivolol (BYSTOLIC) 5 MG tablet Take 1 tablet (5 mg total) by mouth daily. 03/06/18  Yes Imogene Burn, PA-C  spironolactone (ALDACTONE) 25 MG tablet Take 1 tablet (25 mg total) by mouth daily. 09/13/17  Yes Turner, Eber Hong, MD  traMADol (ULTRAM) 50 MG tablet Take 1-2 tablets (50-100 mg total) by mouth every 6 (six) hours as needed. Patient taking differently: Take 50-100 mg by mouth every 6 (six) hours as needed for moderate pain.  08/15/17  Yes Autumn Messing III, MD  XARELTO 20 MG TABS tablet TAKE 1 TABLET EVERY DAY WITH SUPPER 01/02/18  Yes Sueanne Margarita, MD     Allergies:    Allergies  Allergen Reactions  . Ambien [Zolpidem] Other (See Comments)    Hallucinations, up walking around  . Codeine Phosphate Nausea And Vomiting  . Medrol [Methylprednisolone] Other (See Comments)    Felt really weird w the high dose oral steroid, but tolerates low doses or oral steroid    Social History:   Social History   Socioeconomic History  . Marital status: Widowed    Spouse name: Not on file  . Number of children: 2  . Years of education: Not on file  . Highest education level: Not on file  Occupational History  . Occupation: retired  Scientific laboratory technician  . Financial resource strain: Not on file  . Food insecurity:    Worry: Not on file    Inability: Not on file  . Transportation needs:    Medical: Not on file    Non-medical: Not on file  Tobacco Use  . Smoking status: Former Smoker    Packs/day:  1.50    Years: 35.00    Pack years: 52.50    Types: Cigarettes    Last attempt to quit: 08/24/2001    Years since quitting: 16.5  . Smokeless tobacco: Never Used  Substance and Sexual Activity  . Alcohol  use: No  . Drug use: No  . Sexual activity: Never    Birth control/protection: Post-menopausal  Lifestyle  . Physical activity:    Days per week: Not on file    Minutes per session: Not on file  . Stress: Not on file  Relationships  . Social connections:    Talks on phone: Not on file    Gets together: Not on file    Attends religious service: Not on file    Active member of club or organization: Not on file    Attends meetings of clubs or organizations: Not on file    Relationship status: Not on file  . Intimate partner violence:    Fear of current or ex partner: Not on file    Emotionally abused: Not on file    Physically abused: Not on file    Forced sexual activity: Not on file  Other Topics Concern  . Not on file  Social History Narrative  . Not on file    Family History:   The patient's family history includes Alzheimer's disease in her sister; Dementia in her sister; Diabetes in her brother, father, mother, and sister; Heart disease in her brother and father; Irritable bowel syndrome in her brother; Liver cancer in her mother; Pancreatic cancer in her mother.    ROS:  Please see the history of present illness.  All other ROS reviewed and negative.     Physical Exam/Data:  There were no vitals filed for this visit. No intake or output data in the 24 hours ending 03/21/18 1410 There were no vitals filed for this visit. There is no height or weight on file to calculate BMI.  General:  Well nourished, well developed, in no acute distress HEENT: normal Lymph: no adenopathy Neck: no JVD Endocrine:  No thryomegaly Vascular: No carotid bruits Cardiac:  iRRR; no murmurs, gallops or rubs Lungs:  CTA b/l, no wheezing, rhonchi or rales  Abd: soft, nontender, no  hepatomegaly  Ext: no edema Musculoskeletal:  No deformities Skin: warm and dry  Neuro: no focal abnormalities noted Psych:  Normal affect    EKG:  Reviewed today's and 02/22/18 EKG's with Dr. Rayann Heman, feels QTc is OK to proceed with intiation  Relevant CV Studies:  2D echo 03/03/2018 Study Conclusions - Left ventricle: The cavity size was mildly dilated. Wall thickness was increased in a pattern of mild LVH. Indeterminant diastolic function (atrial fibrillation). The estimated ejection fraction was 25%. Diffuse hypokinesis. - Aortic valve: There was no stenosis. - Mitral valve: Mildly calcified annulus. There was mild regurgitation. - Left atrium: The atrium was mildly dilated. - Right ventricle: The cavity size was normal. Systolic function was normal. - Right atrium: The atrium was mildly dilated. - Tricuspid valve: Peak RV-RA gradient (S): 26 mm Hg. - Pulmonary arteries: PA peak pressure: 29 mm Hg (S). - Inferior vena cava: The vessel was normal in size. The respirophasic diameter changes were in the normal range (>= 50%), consistent with normal central venous pressure. - Pericardium, extracardiac: A trivial pericardial effusion was identified. Impressions: - The patient was in atrial fibrillation. Mildly dilated LV with mild LV hypertrophy. EF 25%, diffuse hypokinesis. Normal RV size and systolic function. Mild MR.    Laboratory Data:  Chemistry Recent Labs  Lab 03/21/18 1133  NA 137  K 4.0  CL 103  CO2 21*  GLUCOSE 144*  BUN 24*  CREATININE 1.37*  CALCIUM 9.9  GFRNONAA 38*  GFRAA 43*  ANIONGAP 13  No results for input(s): PROT, ALBUMIN, AST, ALT, ALKPHOS, BILITOT in the last 168 hours. HematologyNo results for input(s): WBC, RBC, HGB, HCT, MCV, MCH, MCHC, RDW, PLT in the last 168 hours. Cardiac EnzymesNo results for input(s): TROPONINI in the last 168 hours. No results for input(s): TROPIPOC in the last 168 hours.  BNPNo results for  input(s): BNP, PROBNP in the last 168 hours.  DDimer No results for input(s): DDIMER in the last 168 hours.  Radiology/Studies:  No results found.  Assessment and Plan:   1. Persistent AFib, here for Tikosn initiation     CHA2DS2Vasc is 6, on Xarelto     K+ 4.0     Mag 1.6 (will order replacement)     Creat 1.37 (Calc CrCl is 64)     EKGs reviewed with Dr. Rayann Heman, OK to proceed  I have reviewed patient use of Diflucan with RPH here, 1/2 life is 30 hours to as long as 50 hours in elderly, recommends at least waiting 4 days (three 1/2 lives).   Discussed with the patient, she prefers to pursue Tikosyn rather then waiting to complete the 6 week course of Diflkucan and will discuss with  Her podiatrist next week, though states doing much better, reviewed Lotrisone cream with RPH, OK to continue  Discussed with Dr. Rayann Heman, will plan to start Tikosyn tomorrow, after 4 days without Diflucan  2. DM     Continue home regime  3. HTN     Continue home meds             For questions or updates, please contact [facility] Please consult [email or web address] for contact info under Cardiology/STEMI.    Signed, Baldwin Jamaica, PA-C  03/21/2018 2:10 PM    I have seen, examined the patient, and reviewed the above assessment and plan.  Changes to above are made where necessary.  On exam, overweight.  IRRR.  Pt with refractory afib.  Will admit for initiation of tikosyn.  She has previously declined sleep study.  I discussed at length with her.  I agree with Dr Radford Pax that sleep study is indicated.  Will wait until tomorrow to initiate tikosyn, after fluconazole has appropriately washed out.  Co Sign: Thompson Grayer, MD 03/21/2018 10:26 PM

## 2018-03-21 NOTE — Progress Notes (Signed)
Primary Care Physician: Marletta Lor, MD Referring Physician:Dr. Shiza Cynthia Herrera is a 72 y.o. female with a h/o Coronary calcifications on Coronary CT with nonobstructive CAD on cath, history of NICM which has improved LVEF 50-55% but now back down to 25%, HTN, dyslipidemia, PAF S/P TEE/DCCV but failed to hold NSR, repeat cardioversion 11/14/16 after more Amio load, but didn't convert, but converted later that day spontaneouly. Amio stopped secondary to severe dizziness in 2018. Last saw Dr. Radford Pax 02/20/18 and was back in Afib with RVR. She was on Xarelto and Bystolic increased. She underwent DCCV 02/22/18.  Patient was referred back to [place] by Dr. Marla Roe who planned to do breast reduction surgery on her 03/22/18 but she was back in  Afib with RVR.   Pt was seen in afib clinic, 5/20 and says she's going in/out of Afib, wants options to stay in SR. Gets short of breath with exertion. Goes away with rest.  2D echo on 03/03/2018 patient was back in atrial fibrillation and LVEF is now 25%.Wants to put off breast reduction surgery until her heart rhythm is addressed. She does not drink alcohol,caffeine. [employer] and although she will be in donut hole like next month, she was told still price would be  around $92 a month,  pt can afford that.  Today, she denies symptoms of palpitations, chest pain, shortness of breath, orthopnea, PND, lower extremity edema, dizziness, presyncope, syncope, or neurologic sequela. The patient is tolerating medications without difficulties and is otherwise without complaint today.   Past Medical History:  Diagnosis Date  . Arthritis   . Breast cancer, left breast ([place]) 2002   "then treated w/chemo and radiation"  . Breast cancer, left breast ([place]) 07/14/2017   "tx'd w/mastectomy"  . Chronic back pain    h/o lumbar stenosis; "no problem since my OR" (`10/27/2017)  . Chronic systolic CHF (congestive heart failure) (HCC)    takes  Furosemide and Aldactone daily  . Complication of anesthesia    last surgery iv med when going to sleep"burned as injected"  . COPD (chronic obstructive pulmonary disease) (HCC)    no inhalers   . DCM (dilated cardiomyopathy) ([place])    EF 15-20% ? tachycardia induced - EF 50-55% on echo 2015  . Dyspnea    with exertion  . Dysrhythmia    afib  . Gallstones   . GERD (gastroesophageal reflux disease)    once in a while;depends on what she eats  . History of bronchitis    "chronic when I smoked; no problem since I quit in 2002" (10/27/2017)  . Hyperlipidemia   . Hypertension    takes Losartan and Metoprolol.   . Joint pain   . Muscle spasm    takes Flexeril daily as needed   . Nocturia   . Osteopenia   . Persistent atrial fibrillation ([place])    s/p TEE DCCV and repeat DCCV 11/14/2013  . Personal history of chemotherapy   . Personal history of colonic polyps    adenomas 03 and 08  . Personal history of radiation therapy   . Pneumonia    hx  . Seasonal allergies   . Thyroid nodule   . Type II diabetes mellitus ([place])   . Vitamin D deficiency    takes Vit D   Past Surgical History:  Procedure Laterality Date  . AXILLARY LYMPH NODE DISSECTION Left 10/2001   persistent intramammary node Archie Endo 03/09/2011  . BACK SURGERY    .  BREAST BIOPSY Left 2002  . BREAST BIOPSY Right 06/2017   "node bx was negative"  . BREAST BIOPSY Left 06/2017   "positive for cancer"  . BREAST LUMPECTOMY WITH RADIOACTIVE SEED LOCALIZATION Right 08/15/2017   Procedure: RIGHT BREAST LUMPECTOMY WITH RADIOACTIVE SEED LOCALIZATION;  Surgeon: Jovita Kussmaul, MD;  Location: [facility];  Service: General;  Laterality: Right;  . CARDIAC CATHETERIZATION  2015  . CARDIOVERSION N/A 11/12/2013   Procedure: CARDIOVERSION;  Surgeon: Thayer Headings, MD;  Location[facility] ENDOSCOPY;  Service: Cardiovascular;  Laterality: N/A;  10:08  Dr. Marissa Nestle, anesthesia present, [place]   60mg ,  propofol 50mg , IV for elective cardioversion....Dr.  Cathie Olden delievered synch 120 joules with successful cardioversion to NSR  . CARDIOVERSION N/A 11/12/2013   Procedure: CARDIOVERSION;  Surgeon: Thayer Headings, MD;  Location: [facility];  Service: Cardiovascular;  Laterality: N/A;  . CARDIOVERSION N/A 11/14/2013   Procedure: CARDIOVERSION (BEDSIDE);  Surgeon: Larey Dresser, MD;  Location: [facility];  Service: Cardiovascular;  Laterality: N/A;  . CARDIOVERSION N/A 02/22/2018   Procedure: CARDIOVERSION;  Surgeon: Sueanne Margarita, MD;  Location: [facility] ENDOSCOPY;  Service: Cardiovascular;  Laterality: N/A;  . CATARACT EXTRACTION W/ INTRAOCULAR LENS  IMPLANT, BILATERAL Bilateral ~ 2016  . COLONOSCOPY  2003, September 2008, April 01, 2011   adenomas 03 and 08, polyp 12, diverticulosis  . COLONOSCOPY WITH PROPOFOL N/A 11/16/2016   Procedure: COLONOSCOPY WITH PROPOFOL;  Surgeon: Gatha Mayer, MD;  Location: [facility] ENDOSCOPY;  Service: Endoscopy;  Laterality: N/A;  . JOINT REPLACEMENT    . LAPAROSCOPIC CHOLECYSTECTOMY  2004  . LEFT AND RIGHT HEART CATHETERIZATION WITH CORONARY ANGIOGRAM N/A 02/01/2014   Procedure: LEFT AND RIGHT HEART CATHETERIZATION WITH CORONARY ANGIOGRAM;  Surgeon: Sueanne Margarita, MD;  Location: [facility] CATH LAB;  Service: Cardiovascular;  Laterality: N/A;  . LUMBAR LAMINECTOMY/DECOMPRESSION MICRODISCECTOMY N/A 03/19/2014   Procedure: LUMBAR LAMINECTOMY/DECOMPRESSION MICRODISCECTOMY 4 LEVEL;  Surgeon: Kristeen Miss, MD;  Location: [place] [facility] NEURO ORS;  Service: Neurosurgery;  Laterality: N/A;  L1-2 L2-3 L3-4 L4-5 Laminectomy/Foraminotomy  . MASS EXCISION Left 08/15/2017   Procedure: EXCISION OF LEFT BREAST MASS;  Surgeon: Jovita Kussmaul, MD;  Location: [facility];  Service: General;  Laterality: Left;  Marland Kitchen MASTECTOMY Left 10/27/2017  . MASTECTOMY PARTIAL / LUMPECTOMY W/ AXILLARY LYMPHADENECTOMY  05/08/2001   Archie Endo 03/09/2011  . PORT-A-CATH REMOVAL  2003  . PORTA CATH INSERTION  2002  . TEE WITHOUT CARDIOVERSION N/A 11/12/2013   Procedure: TRANSESOPHAGEAL  ECHOCARDIOGRAM (TEE);  Surgeon: Thayer Headings, MD;  Location: [facility];  Service: Cardiovascular;  Laterality: N/A;  pt b/p low, pt buccal membranes very dry, lips scapped, pt c/o thirst. NPO since MN and iv FLUIDS TOTAL INFUSING AT TOTAL 20ML HR....  Dr. Cathie Olden order allow NS to bolus during procedure....very dry,NS bolus 250 ml total..pt responding well to meds..  . TOTAL KNEE ARTHROPLASTY Left 2006  . TOTAL KNEE ARTHROPLASTY Right 05/10/2016   Procedure: RIGHT TOTAL KNEE ARTHROPLASTY;  Surgeon: Gaynelle Arabian, MD;  Location: [facility] ORS;  Service: Orthopedics;  Laterality: Right;  With adductor block  . TOTAL MASTECTOMY Left 10/27/2017   Procedure: LEFT MASTECTOMY;  Surgeon: Jovita Kussmaul, MD;  Location: [place];  Service: General;  Laterality: Left;  . TUBAL LIGATION      Current Outpatient Medications  Medication Sig Dispense Refill  . amoxicillin (AMOXIL) 500 MG capsule Take 2,000 mg by mouth See admin instructions. Take 2000 mg by mouth 1 hour prior to dental appointment  5  .  atorvastatin (LIPITOR) 40 MG tablet TAKE 1 TABLET (40 MG TOTAL) BY MOUTH AT BEDTIME. 90 tablet 3  . Cholecalciferol (VITAMIN D3) 2000 units capsule Take 2,000 Units by mouth daily.    . clotrimazole-betamethasone (LOTRISONE) cream Apply 1 application topically 2 (two) times daily. 30 g 0  . fexofenadine (ALLEGRA) 180 MG tablet Take 180 mg by mouth daily as needed for allergies or rhinitis.    . fluticasone (FLONASE) 50 MCG/ACT nasal spray Place 1 spray into both nostrils daily as needed for allergies.     . furosemide (LASIX) 20 MG tablet Take 1 tablet (20 mg total) by mouth 2 (two) times daily. 180 tablet 3  . glucose blood test strip Use to check CBG AC and QHS. 200 each 12  . KLOR-CON M20 20 MEQ tablet Take 1 tablet (20 mEq total) by mouth daily. 90 tablet 3  . letrozole (FEMARA) 2.5 MG tablet Take 1 tablet (2.5 mg total) by mouth daily. 90 tablet 3  . losartan (COZAAR) 25 MG tablet TAKE 1 TABLET BY MOUTH TWICE A  DAY 180 tablet 3  . metFORMIN (GLUCOPHAGE) 500 MG tablet TAKE 1 TABLET TWICE A DAY WITH A MEAL 180 tablet 1  . nebivolol (BYSTOLIC) 5 MG tablet Take 1 tablet (5 mg total) by mouth daily. 90 tablet 3  . spironolactone (ALDACTONE) 25 MG tablet Take 1 tablet (25 mg total) by mouth daily. 90 tablet 3  . traMADol (ULTRAM) 50 MG tablet Take 1-2 tablets (50-100 mg total) by mouth every 6 (six) hours as needed. (Patient taking differently: Take 50-100 mg by mouth every 6 (six) hours as needed for moderate pain. ) 20 tablet 0  . XARELTO 20 MG TABS tablet TAKE 1 TABLET EVERY DAY WITH SUPPER 30 tablet 6  . fluconazole (DIFLUCAN) 150 MG tablet Take 1 tablet (150 mg total) by mouth once a week. (Patient not taking: Reported on 03/21/2018) 6 tablet 1   No current facility-administered medications for this encounter.     Allergies  Allergen Reactions  . Ambien [Zolpidem] Other (See Comments)    Hallucinations, up walking around  . Codeine Phosphate Nausea And Vomiting  . Medrol [Methylprednisolone] Other (See Comments)    Felt really weird w the high dose oral steroid, but tolerates low doses or oral steroid    Social History   Socioeconomic History  . Marital status: Widowed    Spouse name: Not on file  . Number of children: 2  . Years of education: Not on file  . Highest education level: Not on file  Occupational History  . Occupation: retired  Scientific laboratory technician  . Financial resource strain: Not on file  . Food insecurity:    Worry: Not on file    Inability: Not on file  . Transportation needs:    Medical: Not on file    Non-medical: Not on file  Tobacco Use  . Smoking status: Former Smoker    Packs/day: 1.50    Years: 35.00    Pack years: 52.50    Types: Cigarettes    Last attempt to quit: 08/24/2001    Years since quitting: 16.5  . Smokeless tobacco: Never Used  Substance and Sexual Activity  . Alcohol use: No  . Drug use: No  . Sexual activity: Never    Birth control/protection:  Post-menopausal  Lifestyle  . Physical activity:    Days per week: Not on file    Minutes per session: Not on file  . Stress: Not on  file  Relationships  . Social connections:    Talks on phone: Not on file    Gets together: Not on file    Attends religious service: Not on file    Active member of club or organization: Not on file    Attends meetings of clubs or organizations: Not on file    Relationship status: Not on file  . Intimate partner violence:    Fear of current or ex partner: Not on file    Emotionally abused: Not on file    Physically abused: Not on file    Forced sexual activity: Not on file  Other Topics Concern  . Not on file  Social History Narrative  . Not on file    Family History  Problem Relation Age of Onset  . Dementia Sister   . Diabetes Sister   . Alzheimer's disease Sister   . Diabetes Brother        x 2  . Heart disease Brother   . Irritable bowel syndrome Brother   . Liver cancer Mother   . Diabetes Mother   . Pancreatic cancer Mother   . Diabetes Father   . Heart disease Father     ROS- All systems are reviewed and negative except as per the HPI above  Physical Exam: Vitals:   03/21/18 1121  BP: 114/64  Pulse: 100  Weight: 240 lb (108.9 kg)  Height: 5\' 6"  (1.676 m)   Wt Readings from Last 3 Encounters:  03/21/18 240 lb (108.9 kg)  03/13/18 243 lb 12.8 oz (110.6 kg)  03/06/18 247 lb (112 kg)    Labs: Lab Results  Component Value Date   NA 137 03/21/2018   K 4.0 03/21/2018   CL 103 03/21/2018   CO2 21 (L) 03/21/2018   GLUCOSE 144 (H) 03/21/2018   BUN 24 (H) 03/21/2018   CREATININE 1.37 (H) 03/21/2018   CALCIUM 9.9 03/21/2018   MG 1.6 (L) 03/21/2018   Lab Results  Component Value Date   INR 1.10 01/02/2018   Lab Results  Component Value Date   CHOL 121 03/03/2018   HDL 39 (L) 03/03/2018   LDLCALC 56 03/03/2018   TRIG 128 03/03/2018     GEN- The patient is well appearing, alert and oriented x 3 today.   Head-  normocephalic, atraumatic Eyes-  Sclera clear, conjunctiva pink Ears- hearing intact Oropharynx- clear Neck- supple, no JVP Lymph- no cervical lymphadenopathy Lungs- Clear to ausculation bilaterally, normal work of breathing Heart-irregular rate and rhythm, no murmurs, rubs or gallops, PMI not laterally displaced GI- soft, NT, ND, + BS Extremities- no clubbing, cyanosis, or edema MS- no significant deformity or atrophy Skin- no rash or lesion Psych- euthymic mood, full affect Neuro- strength and sensation are intact  EKG-afib at 111 bpm, qrs int 92 ms, qtc at 492 ms Epic records reviewed Echo-Study Conclusions  - Left ventricle: The cavity size was mildly dilated. Wall   thickness was increased in a pattern of mild LVH. Indeterminant   diastolic function (atrial fibrillation). The estimated ejection   fraction was 25%. Diffuse hypokinesis. - Aortic valve: There was no stenosis. - Mitral valve: Mildly calcified annulus. There was mild   regurgitation. - Left atrium: The atrium was mildly dilated. - Right ventricle: The cavity size was normal. Systolic function   was normal. - Right atrium: The atrium was mildly dilated. - Tricuspid valve: Peak RV-RA gradient (S): 26 mm Hg. - Pulmonary arteries: PA peak pressure: 29 mm Hg (  S). - Inferior vena cava: The vessel was normal in size. The   respirophasic diameter changes were in the normal range (>= 50%),   consistent with normal central venous pressure. - Pericardium, extracardiac: A trivial pericardial effusion was   identified.  Impressions:  - The patient was in atrial fibrillation. Mildly dilated LV with   mild LV hypertrophy. EF 25%, diffuse hypokinesis. Normal RV size   and systolic function. Mild MR.  ------------------------------------------------------------------- Study data:  Comparison was made to the study of 08/26/2014.  Study status:  Routine.  Procedure:  The patient reported no pain pre or post test.  Transthoracic echocardiography for left ventricular function evaluation. Image quality was adequate.  Study completion:  There were no complications.          Transthoracic echocardiography.  M-mode, complete 2D, spectral Doppler, and color Doppler.  Birthdate:  Patient birthdate: [birth year].  Age:  Patient is 72 yr old.  Sex:  Gender: female.    BMI: 40.4 kg/m^2.  Blood pressure:     126/64  Patient status:  Outpatient.  Study date: Study date: 03/03/2018. Study time: 10:20 AM.  Location:   [facility]  -------------------------------------------------------------------  ------------------------------------------------------------------- Left ventricle:  The cavity size was mildly dilated. Wall thickness was increased in a pattern of mild LVH. Indeterminant diastolic function (atrial fibrillation). The estimated ejection fraction was 25%. Diffuse hypokinesis.  ------------------------------------------------------------------- Aortic valve:   Trileaflet; mildly calcified leaflets.  Doppler: There was no stenosis.   There was no regurgitation.  ------------------------------------------------------------------- Aorta:  Aortic root: The aortic root was normal in size. Ascending aorta: The ascending aorta was normal in size.  ------------------------------------------------------------------- Mitral valve:   Mildly calcified annulus.  Doppler:   There was no evidence for stenosis.   There was mild regurgitation.    Peak gradient (D): 8 mm Hg.  ------------------------------------------------------------------- Left atrium:  The atrium was mildly dilated.  ------------------------------------------------------------------- Right ventricle:  The cavity size was normal. Systolic function was normal.  ------------------------------------------------------------------- Pulmonic valve:    Structurally normal valve.   Cusp separation was normal.  Doppler:  Transvalvular  velocity was within the normal range. There was trivial regurgitation.  ------------------------------------------------------------------- Tricuspid valve:   Doppler:  There was trivial regurgitation.   ------------------------------------------------------------------- Right atrium:  The atrium was mildly dilated.  ------------------------------------------------------------------- Pericardium:  A trivial pericardial effusion was identified.  ------------------------------------------------------------------- Systemic veins: Inferior vena cava: The vessel was normal in size. The respirophasic diameter changes were in the normal range (>= 50%), consistent with normal central venous pressure.  -------------------------------------------------------------------     Assessment and Plan: 1. Persistent  afib Would be most beneficial to the pt to restore SR for EF of 25%, probably TMC Options discussed to restore SR Amiodarone is out of the picture as she did not tolerate in 2018 With CAD and LV dysfunction, Tikosyn or sotalol her best options Precautions with these drugs discussed Pt states that he can afford tikosyn No benadryl use PharmD  reviewed drugs, pt taking is not taking fluconazole currently  QTc in SR was acceptable less than 440 ms Continue xarelto 20 mg a day for CHA2DS2VASc score of 5 Labs today with Kt at 4.0, mag at 1.6( will need to be repleated) and crcl cal at 63.790 but at 79.45 based on crcl in the recent past at 1.10  Cynthia Herrera, [facility] [address] [place], [place] [address] [phone]

## 2018-03-21 NOTE — Progress Notes (Addendum)
Pharmacy Review for Dofetilide (Tikosyn) Initiation  Admit Complaint: 72 y.o. female admitted 03/21/2018 with atrial fibrillation to be initiated on dofetilide.   Assessment:  Patient Exclusion Criteria: If any screening criteria checked as "Yes", then  patient  should NOT receive dofetilide until criteria item is corrected. If "Yes" please indicate correction plan.  YES  NO Patient  Exclusion Criteria Correction Plan  [x]  []  Baseline QTc interval is greater than or equal to 440 msec. IF above YES box checked dofetilide contraindicated unless patient has ICD; then may proceed if QTc 500-550 msec or with known ventricular conduction abnormalities may proceed with QTc 550-600 msec. QTc = 474 Reviewed by Dr Rayann Heman and okay to proceed   [x]  []  Magnesium level is less than 1.8 mEq/l : Last magnesium:  Lab Results  Component Value Date   MG 1.6 (L) 03/21/2018       Receiving 4 g IV tonight and will recheck in morning before first dose  []  [x]  Potassium level is less than 4 mEq/l : Last potassium:  Lab Results  Component Value Date   K 4.0 03/21/2018         []  [x]  Patient is known or suspected to have a digoxin level greater than 2 ng/ml: Lab Results  Component Value Date   DIGOXIN 0.8 11/23/2013      []  [x]  Creatinine clearance less than 20 ml/min (calculated using Cockcroft-Gault, actual body weight and serum creatinine): Estimated Creatinine Clearance: 46.4 mL/min (A) (by C-G formula based on SCr of 1.37 mg/dL (H)).    []  [x]  Patient has received drugs known to prolong the QT intervals within the last 48 hours (phenothiazines, tricyclics or tetracyclic antidepressants, erythromycin, H-1 antihistamines, cisapride, fluoroquinolones, azithromycin). Drugs not listed above may have an, as yet, undetected potential to prolong the QT interval, updated information on QT prolonging agents is available at this website:QT prolonging agents   []  [x]  Patient received a dose of hydrochlorothiazide  (Oretic) alone or in any combination including triamterene (Dyazide, Maxzide) in the last 48 hours.   []  [x]  Patient received a medication known to increase dofetilide plasma concentrations prior to initial dofetilide dose:  . Trimethoprim (Primsol, Proloprim) in the last 36 hours . Verapamil (Calan, Verelan) in the last 36 hours or a sustained release dose in the last 72 hours . Megestrol (Megace) in the last 5 days  . Cimetidine (Tagamet) in the last 6 hours . Ketoconazole (Nizoral) in the last 24 hours . Itraconazole (Sporanox) in the last 48 hours  . Prochlorperazine (Compazine) in the last 36 hours  Fluconazole on PTA list however patient attests to not taking currently (will have 4 days between stop date and starting of tikosyn on 5/29)  []  [x]  Patient is known to have a history of torsades de pointes; congenital or acquired long QT syndromes.   []  [x]  Patient has received a Class 1 antiarrhythmic with less than 2 half-lives since last dose. (Disopyramide, Quinidine, Procainamide, Lidocaine, Mexiletine, Flecainide, Propafenone)   []  [x]  Patient has received amiodarone therapy in the past 3 months or amiodarone level is greater than 0.3 ng/ml.    Patient has been appropriately anticoagulated with Xarelto.  Ordering provider was confirmed at [email or web address] if they are not listed on the [facility] Prescribers list.  Goal of Therapy: Follow renal function, electrolytes, potential drug interactions, and dose adjustment. Provide education and 1 week supply at discharge.  Plan:  [x]   Physician selected initial dose within range recommended for patients level of  renal function - will monitor for response.  []   Physician selected initial dose outside of range recommended for patients level of renal function - will discuss if the dose should be altered at this time.   Select One Calculated CrCl  Dose q12h  [x]  > 60 ml/min 500 mcg  []  40-60 ml/min 250 mcg  []  20-40 ml/min 125 mcg    2. Follow up QTc after the first 5 doses, renal function, electrolytes (K & Mg) daily x 3 days, dose adjustment, success of initiation and facilitate 1 week discharge supply as clinically indicated.  3. Initiate Tikosyn education video (Call [phone] and ask for video # [phone]).  4. Place Enrollment Form on the chart for discharge supply of dofetilide.    Doylene Canard, PharmD Clinical Pharmacist  Pager: [phone] Phone: [phone] 4:40 PM 03/21/2018

## 2018-03-22 ENCOUNTER — Ambulatory Visit (HOSPITAL_COMMUNITY): Admission: RE | Admit: 2018-03-22 | Payer: Medicare Other | Source: Ambulatory Visit | Admitting: Plastic Surgery

## 2018-03-22 ENCOUNTER — Other Ambulatory Visit: Payer: Self-pay

## 2018-03-22 ENCOUNTER — Encounter (HOSPITAL_COMMUNITY): Admission: RE | Payer: Self-pay | Source: Ambulatory Visit

## 2018-03-22 ENCOUNTER — Encounter (HOSPITAL_COMMUNITY): Payer: Self-pay

## 2018-03-22 LAB — GLUCOSE, CAPILLARY
Glucose-Capillary: 114 mg/dL — ABNORMAL HIGH (ref 65–99)
Glucose-Capillary: 125 mg/dL — ABNORMAL HIGH (ref 65–99)
Glucose-Capillary: 91 mg/dL (ref 65–99)

## 2018-03-22 LAB — BASIC METABOLIC PANEL
Anion gap: 11 (ref 5–15)
BUN: 29 mg/dL — AB (ref 6–20)
CHLORIDE: 103 mmol/L (ref 101–111)
CO2: 24 mmol/L (ref 22–32)
CREATININE: 1.17 mg/dL — AB (ref 0.44–1.00)
Calcium: 9.4 mg/dL (ref 8.9–10.3)
GFR calc Af Amer: 53 mL/min — ABNORMAL LOW (ref >60)
GFR calc non Af Amer: 45 mL/min — ABNORMAL LOW (ref >60)
GLUCOSE: 121 mg/dL — AB (ref 65–99)
POTASSIUM: 4.2 mmol/L (ref 3.5–5.1)
SODIUM: 138 mmol/L (ref 135–145)

## 2018-03-22 LAB — MAGNESIUM: MAGNESIUM: 2.4 mg/dL (ref 1.7–2.4)

## 2018-03-22 SURGERY — MAMMOPLASTY, REDUCTION
Anesthesia: General | Site: Breast | Laterality: Right

## 2018-03-22 MED ORDER — PNEUMOCOCCAL VAC POLYVALENT 25 MCG/0.5ML IJ INJ
0.5000 mL | INJECTION | INTRAMUSCULAR | Status: DC
  Filled 2018-03-22: qty 0.5

## 2018-03-22 MED ORDER — SODIUM CHLORIDE 0.9 % IV SOLN
250.0000 mL | INTRAVENOUS | Status: DC
  Administered 2018-03-23: 250 mL via INTRAVENOUS

## 2018-03-22 MED ORDER — SODIUM CHLORIDE 0.9% FLUSH: 3.0000 mL | INTRAVENOUS | Status: DC | PRN

## 2018-03-22 MED ORDER — SODIUM CHLORIDE 0.9% FLUSH
3.0000 mL | Freq: Two times a day (BID) | INTRAVENOUS | Status: DC
  Administered 2018-03-22 – 2018-03-26 (×8): 3 mL via INTRAVENOUS

## 2018-03-22 NOTE — Progress Notes (Signed)
Post Tikosyn EKG reviewed with Dr. Rayann Heman.   Telemetry reviewed with noting only occ PVC's.   Will continue with 591mcg dose this evening.  Tommye Standard, PA-C

## 2018-03-22 NOTE — Discharge Instructions (Addendum)
You have an appointment set up with the [facility].  Multiple studies have shown that being followed by a dedicated atrial fibrillation clinic in addition to the standard care you receive from your other physicians improves health. We believe that enrollment in the atrial fibrillation clinic will allow [place] to better care for you.   The phone number to the [facility] is [phone]. The clinic is staffed Monday through Friday from 8:30am to 5pm.  Parking Directions: The clinic is located in the Heart and Vascular Building connected to [facility]. 1)From [address] turn on to [address] and go to the 3rd entrance  (Heart and Vascular entrance) on the right. 2)Look to the right for Heart &Vascular Parking Garage. 3)A code for the entrance is required please call the clinic to receive this.   4)Take the elevators to the 1st floor. Registration is in the room with the glass walls at the end of the hallway.  If you have any trouble parking or locating the clinic, please dont hesitate to call [phone].  Information on my medicine - XARELTO (Rivaroxaban)  This medication education was reviewed with me or my healthcare representative as part of my discharge preparation.    Why was Xarelto prescribed for you? Xarelto was prescribed for you to reduce the risk of a blood clot forming that can cause a stroke if you have a medical condition called atrial fibrillation (a type of irregular heartbeat).  What do you need to know about xarelto ? Take your Xarelto ONCE DAILY at the same time every day with your evening meal. If you have difficulty swallowing the tablet whole, you may crush it and mix in applesauce just prior to taking your dose.  Take Xarelto exactly as prescribed by your doctor and DO NOT stop taking Xarelto without talking to the doctor who prescribed the medication.  Stopping without other stroke prevention medication to take the  place of Xarelto may increase your risk of developing a clot that causes a stroke.  Refill your prescription before you run out.  After discharge, you should have regular check-up appointments with your healthcare provider that is prescribing your Xarelto.  In the future your dose may need to be changed if your kidney function or weight changes by a significant amount.  What do you do if you miss a dose? If you are taking Xarelto ONCE DAILY and you miss a dose, take it as soon as you remember on the same day then continue your regularly scheduled once daily regimen the next day. Do not take two doses of Xarelto at the same time or on the same day.   Important Safety Information A possible side effect of Xarelto is bleeding. You should call your healthcare provider right away if you experience any of the following: ? Bleeding from an injury or your nose that does not stop. ? Unusual colored urine (red or dark brown) or unusual colored stools (red or black). ? Unusual bruising for unknown reasons. ? A serious fall or if you hit your head (even if there is no bleeding).  Some medicines may interact with Xarelto and might increase your risk of bleeding while on Xarelto. To help avoid this, consult your healthcare provider or pharmacist prior to using any new prescription or non-prescription medications, including herbals, vitamins, non-steroidal anti-inflammatory drugs (NSAIDs) and supplements.  This website has more information on Xarelto: [email or web address]   FOLLOW UP WITH PODIATRIST

## 2018-03-22 NOTE — Progress Notes (Addendum)
Progress Note  Patient Name: Cynthia Herrera Date of Encounter: 03/22/2018  Primary Cardiologist: Dr. Radford Pax  Subjective   No complaints, no CP, palpitations, or SOB  Inpatient Medications    Scheduled Meds: . atorvastatin  40 mg Oral q1800  . cholecalciferol  2,000 Units Oral Daily  . clotrimazole-betamethasone  1 application Topical BID  . dofetilide  500 mcg Oral BID  . furosemide  20 mg Oral BID  . letrozole  2.5 mg Oral Daily  . losartan  25 mg Oral BID  . metFORMIN  500 mg Oral BID WC  . nebivolol  5 mg Oral Daily  . [START ON 03/23/2018] pneumococcal 23 valent vaccine  0.5 mL Intramuscular Tomorrow-1000  . potassium chloride SA  20 mEq Oral Daily  . rivaroxaban  20 mg Oral Q supper  . sodium chloride flush  3 mL Intravenous Q12H  . spironolactone  25 mg Oral Daily   Continuous Infusions: . sodium chloride     PRN Meds: sodium chloride, sodium chloride flush   Vital Signs    Vitals:   03/21/18 1417 03/21/18 2145 03/22/18 0644 03/22/18 0800  BP: 122/90 128/82 (!) 105/58 100/67  Pulse: 100 (!) 115 66   Resp:  (!) 21 18   Temp: 97.7 F (36.5 C) 98 F (36.7 C) 98.4 F (36.9 C)   TempSrc: Oral Oral Oral   SpO2: 96% 97% 95%   Weight:   240 lb 11.2 oz (109.2 kg)   Height:        Intake/Output Summary (Last 24 hours) at 03/22/2018 1030 Last data filed at 03/22/2018 1610 Gross per 24 hour  Intake 925 ml  Output -  Net 925 ml   Filed Weights   03/21/18 1411 03/22/18 0644  Weight: 240 lb 4.8 oz (109 kg) 240 lb 11.2 oz (109.2 kg)    Telemetry    AFib w/CVR - Personally Reviewed  ECG    No new EKG  - Personally Reviewed  Physical Exam   GEN: No acute distress.   Neck: No JVD Cardiac: iRRR, no murmurs, rubs, or gallops.  Respiratory: CTA b/l GI: Soft, nontender, non-distended  MS: No edema; No deformity. Neuro:  Nonfocal  Psych: Normal affect   Labs    Chemistry Recent Labs  Lab 03/21/18 1133 03/22/18 0538  NA 137 138  K 4.0 4.2    CL 103 103  CO2 21* 24  GLUCOSE 144* 121*  BUN 24* 29*  CREATININE 1.37* 1.17*  CALCIUM 9.9 9.4  GFRNONAA 38* 45*  GFRAA 43* 53*  ANIONGAP 13 11     HematologyNo results for input(s): WBC, RBC, HGB, HCT, MCV, MCH, MCHC, RDW, PLT in the last 168 hours.  Cardiac EnzymesNo results for input(s): TROPONINI in the last 168 hours. No results for input(s): TROPIPOC in the last 168 hours.   BNPNo results for input(s): BNP, PROBNP in the last 168 hours.   DDimer No results for input(s): DDIMER in the last 168 hours.   Radiology    No results found.  Cardiac Studies   2D echo 03/03/2018 Study Conclusions - Left ventricle: The cavity size was mildly dilated. Wall thickness was increased in a pattern of mild LVH. Indeterminant diastolic function (atrial fibrillation). The estimated ejection fraction was 25%. Diffuse hypokinesis. - Aortic valve: There was no stenosis. - Mitral valve: Mildly calcified annulus. There was mild regurgitation. - Left atrium: The atrium was mildly dilated. - Right ventricle: The cavity size was normal. Systolic function  was normal. - Right atrium: The atrium was mildly dilated. - Tricuspid valve: Peak RV-RA gradient (S): 26 mm Hg. - Pulmonary arteries: PA peak pressure: 29 mm Hg (S). - Inferior vena cava: The vessel was normal in size. The respirophasic diameter changes were in the normal range (>= 50%), consistent with normal central venous pressure. - Pericardium, extracardiac: A trivial pericardial effusion was identified. Impressions: - The patient was in atrial fibrillation. Mildly dilated LV with mild LV hypertrophy. EF 25%, diffuse hypokinesis. Normal RV size and systolic function. Mild MR.  Patient Profile     72 y.o. female with a history of non-obstructive CAD, NICM that had improved though down again by last echo (? tacy-mediated), HTN, HLD, chronic CHF (systolic), CBP, COPD, GERD, breast cancer treated, DM, persistent  AFib, admitted for Tikosyn initiation  Assessment & Plan    1. Persistent AFib, here for Tikosn initiation     CHA2DS2Vasc is 6, on Xarelto     K+ 4.0     Mag 1.6 (will order replacement)     Creat 1.37 (Calc CrCl is 64)     EKGs were reviewed with Dr. Rayann Heman yesterday, OK to proceed     1st dose today given to allow 4 days without Diflucan     Follow EKGs closely     Plan DCCV tomorrow afternoon  I have reviewed patient use of Diflucan with RPH here, 1/2 life is 30 hours to as long as 50 hours in elderly, recommends at least waiting 4 days (three 1/2 lives).   Discussed with the patient, she prefered to pursue Tikosyn rather then waiting to complete the 6 week course of Diflucan and will discuss with her podiatrist next week, though states doing much better, reviewed Lotrisone cream with RPH, OK to continue   2. DM     Continue home regime  3. HTN     Continue home meds    For questions or updates, please contact [place] Please consult [email or web address] for contact info under Cardiology/STEMI.      Signed, Baldwin Jamaica, PA-C  03/22/2018, 10:30 AM    I have seen, examined the patient, and reviewed the above assessment and plan.  Changes to above are made where necessary.  On exam, iRRR.  Starting tikosyn this am.  Will follow qt closely.  Consider cardioversion tomorrow if still in afib.  Co Sign: Thompson Grayer, MD 03/22/2018 9:18 PM

## 2018-03-23 ENCOUNTER — Encounter (HOSPITAL_COMMUNITY)
Admission: AD | Disposition: A | Discharge status: Home / Self Care | Payer: Self-pay | Source: Ambulatory Visit | Attending: Internal Medicine

## 2018-03-23 LAB — MAGNESIUM: Magnesium: 2 mg/dL (ref 1.7–2.4)

## 2018-03-23 LAB — BASIC METABOLIC PANEL
Anion gap: 11 (ref 5–15)
BUN: 27 mg/dL — AB (ref 6–20)
CALCIUM: 9.5 mg/dL (ref 8.9–10.3)
CHLORIDE: 104 mmol/L (ref 101–111)
CO2: 24 mmol/L (ref 22–32)
CREATININE: 1.11 mg/dL — AB (ref 0.44–1.00)
GFR calc non Af Amer: 48 mL/min — ABNORMAL LOW (ref >60)
GFR, EST AFRICAN AMERICAN: 56 mL/min — AB (ref >60)
GLUCOSE: 118 mg/dL — AB (ref 65–99)
Potassium: 3.7 mmol/L (ref 3.5–5.1)
Sodium: 139 mmol/L (ref 135–145)

## 2018-03-23 SURGERY — CARDIOVERSION
Anesthesia: General

## 2018-03-23 MED ORDER — POTASSIUM CHLORIDE CRYS ER 20 MEQ PO TBCR
30.0000 meq | EXTENDED_RELEASE_TABLET | Freq: Once | ORAL | Status: AC
  Administered 2018-03-23: 30 meq via ORAL
  Filled 2018-03-23: qty 1

## 2018-03-23 MED ORDER — DOFETILIDE 250 MCG PO CAPS: 250.0000 ug | ORAL_CAPSULE | Freq: Two times a day (BID) | ORAL | Status: DC

## 2018-03-23 MED ORDER — DOFETILIDE 500 MCG PO CAPS
500.0000 ug | ORAL_CAPSULE | Freq: Two times a day (BID) | ORAL | Status: DC
  Administered 2018-03-23: 500 ug via ORAL
  Filled 2018-03-23: qty 1

## 2018-03-23 NOTE — Care Management Note (Addendum)
Case Management Note  Patient Details  Name: Cynthia Herrera MRN: [id number] Date of Birth: 07/29/1946  Subjective/Objective: Pt presented for Tikosyn Load. Benefits Check completed and CM will make pt aware of cost. CM will assist with the Rx for 7 day supply no refills via [facility]. Pt will need additional Rx with refills once stable to transition home. No further needs from CM at this time.                    Action/Plan:  S/W  [facility]  [facility] RX # [id number]   1. TIKOSYN 500 MCG BID  [place] # [phone]    2.DOFETILIDE 500 MCG BID  COVER- YES  CO-PAY- $ 70.76  TIER- 4 DRUG  PRIOR APPROVAL- NO  DEDUCTIBLE : HAS BEEN MET / PATIENT IN COVERAGE GAP   M/O FOR 90 DAY SUPPLY $ 454.41  [facility]ERRED PHARMACY : [facility]   Expected Discharge Date:                  Expected Discharge Plan:  Home/Self Care  In-House Referral:  NA  Discharge planning Services  CM Consult, Medication Assistance  Post Acute Care Choice:  NA Choice offered to:  NA  DME Arranged:  N/A DME Agency:  NA  HH Arranged:  NA HH Agency:  NA  Status of Service:  Completed, signed off  If discussed at [place] of Stay Meetings, dates discussed:    Additional Comments: 1133 03-23-18 Jacqlyn Krauss, RN,BSN [phone] CM did speak with pt and she uses [facility]- Tikosyn in stock. No further needs from CM at this time.  Bethena Roys, RN 03/23/2018, 9:51 AM

## 2018-03-23 NOTE — Progress Notes (Addendum)
Progress Note  Patient Name: Cynthia Herrera Date of Encounter: 03/23/2018  Primary Cardiologist: Dr. Radford Pax  Subjective   No complaints, no CP, palpitations, or SOB  Inpatient Medications    Scheduled Meds: . atorvastatin  40 mg Oral q1800  . cholecalciferol  2,000 Units Oral Daily  . clotrimazole-betamethasone  1 application Topical BID  . furosemide  20 mg Oral BID  . letrozole  2.5 mg Oral Daily  . losartan  25 mg Oral BID  . metFORMIN  500 mg Oral BID WC  . nebivolol  5 mg Oral Daily  . pneumococcal 23 valent vaccine  0.5 mL Intramuscular Tomorrow-1000  . potassium chloride  30 mEq Oral Once  . potassium chloride SA  20 mEq Oral Daily  . rivaroxaban  20 mg Oral Q supper  . sodium chloride flush  3 mL Intravenous Q12H  . sodium chloride flush  3 mL Intravenous Q12H  . spironolactone  25 mg Oral Daily   Continuous Infusions: . sodium chloride    . sodium chloride     PRN Meds: sodium chloride, sodium chloride flush, sodium chloride flush   Vital Signs    Vitals:   03/22/18 0800 03/22/18 1503 03/22/18 2059 03/23/18 0645  BP: 100/67 119/78 113/71 112/65  Pulse:  (!) 109 96 65  Resp:  17 16 17   Temp:   (!) 97.5 F (36.4 C) 97.9 F (36.6 C)  TempSrc:  Oral Oral Oral  SpO2:  99% 96% 97%  Weight:    239 lb 6.4 oz (108.6 kg)  Height:        Intake/Output Summary (Last 24 hours) at 03/23/2018 0819 Last data filed at 03/22/2018 2017 Gross per 24 hour  Intake 963 ml  Output -  Net 963 ml   Filed Weights   03/21/18 1411 03/22/18 0644 03/23/18 0645  Weight: 240 lb 4.8 oz (109 kg) 240 lb 11.2 oz (109.2 kg) 239 lb 6.4 oz (108.6 kg)    Telemetry    AFib >>> SR, she had post termination pause 6.5 sec, sinus rates 60's, occ PVC's, more frequent this AM, Personally Reviewed  ECG    This AM her Sinus EKG is SR 65bpm, QTc is borderline by my measurement  - Personally Reviewed  Physical Exam   GEN: No acute distress.   Neck: No JVD Cardiac: RRR, no  murmurs, rubs, or gallops.  Respiratory: CTA b/l GI: Soft, nontender, non-distended  MS: No edema; No deformity. Neuro:  Nonfocal  Psych: Normal affect   Labs    Chemistry Recent Labs  Lab 03/21/18 1133 03/22/18 0538 03/23/18 0353  NA 137 138 139  K 4.0 4.2 3.7  CL 103 103 104  CO2 21* 24 24  GLUCOSE 144* 121* 118*  BUN 24* 29* 27*  CREATININE 1.37* 1.17* 1.11*  CALCIUM 9.9 9.4 9.5  GFRNONAA 38* 45* 48*  GFRAA 43* 53* 56*  ANIONGAP 13 11 11      HematologyNo results for input(s): WBC, RBC, HGB, HCT, MCV, MCH, MCHC, RDW, PLT in the last 168 hours.  Cardiac EnzymesNo results for input(s): TROPONINI in the last 168 hours. No results for input(s): TROPIPOC in the last 168 hours.   BNPNo results for input(s): BNP, PROBNP in the last 168 hours.   DDimer No results for input(s): DDIMER in the last 168 hours.   Radiology    No results found.  Cardiac Studies   2D echo 03/03/2018 Study Conclusions - Left ventricle: The cavity size was  mildly dilated. Wall thickness was increased in a pattern of mild LVH. Indeterminant diastolic function (atrial fibrillation). The estimated ejection fraction was 25%. Diffuse hypokinesis. - Aortic valve: There was no stenosis. - Mitral valve: Mildly calcified annulus. There was mild regurgitation. - Left atrium: The atrium was mildly dilated. - Right ventricle: The cavity size was normal. Systolic function was normal. - Right atrium: The atrium was mildly dilated. - Tricuspid valve: Peak RV-RA gradient (S): 26 mm Hg. - Pulmonary arteries: PA peak pressure: 29 mm Hg (S). - Inferior vena cava: The vessel was normal in size. The respirophasic diameter changes were in the normal range (>= 50%), consistent with normal central venous pressure. - Pericardium, extracardiac: A trivial pericardial effusion was identified. Impressions: - The patient was in atrial fibrillation. Mildly dilated LV with mild LV hypertrophy. EF  25%, diffuse hypokinesis. Normal RV size and systolic function. Mild MR.  Patient Profile     72 y.o. female with a history of non-obstructive CAD, NICM that had improved though down again by last echo (? tacy-mediated), HTN, HLD, chronic CHF (systolic), CBP, COPD, GERD, breast cancer treated, DM, persistent AFib, admitted for Tikosyn initiation  Assessment & Plan    1. Persistent AFib, here for Tikosn initiation     CHA2DS2Vasc is 6, on Xarelto     K+ 3.7, replaced     Mag 2.0     Creat 1.11 (stable, calc CrCl is 78)     I have reviewed EKGs with Dr. Rayann Heman this AM will continue 551mcg dosing of drug this AM       I have reviewed patient use of Diflucan with RPH here, 1/2 life is 30 hours to as long as 50 hours in elderly, recommended at least waiting 4 days (three 1/2 lives).   Discussed with the patient, she prefered to pursue Tikosyn rather then waiting to complete the 6 week course of Diflucan and will discuss with her podiatrist next week, though states doing much better, reviewed Lotrisone cream with RPH, OK to continue   2. DM     Continue home regime  3. HTN     Continue home meds    For questions or updates, please contact [place] Please consult [email or web address] for contact info under Cardiology/STEMI.      Signed, Baldwin Jamaica, PA-C  03/23/2018, 8:19 AM    I have seen, examined the patient, and reviewed the above assessment and plan.  Changes to above are made where necessary.  On exam, RRR.  QT has lengthened.  Will hold tikosyn tonight and resume 250 mcg BID in AM.  Will need to follow QT closely.  Hope to discharge on Saturday.  Co Sign: Thompson Grayer, MD 03/23/2018 9:21 PM

## 2018-03-23 NOTE — Progress Notes (Addendum)
Patient converted to SR after a 6:60 second pause tonight.  MD was notified.  I will keep monitoring the patient.

## 2018-03-24 LAB — BASIC METABOLIC PANEL
Anion gap: 5 (ref 5–15)
BUN: 29 mg/dL — AB (ref 6–20)
CHLORIDE: 107 mmol/L (ref 101–111)
CO2: 25 mmol/L (ref 22–32)
Calcium: 9.3 mg/dL (ref 8.9–10.3)
Creatinine, Ser: 1.13 mg/dL — ABNORMAL HIGH (ref 0.44–1.00)
GFR calc Af Amer: 55 mL/min — ABNORMAL LOW (ref >60)
GFR calc non Af Amer: 47 mL/min — ABNORMAL LOW (ref >60)
GLUCOSE: 114 mg/dL — AB (ref 65–99)
POTASSIUM: 4.1 mmol/L (ref 3.5–5.1)
Sodium: 137 mmol/L (ref 135–145)

## 2018-03-24 LAB — GLUCOSE, CAPILLARY: GLUCOSE-CAPILLARY: 117 mg/dL — AB (ref 65–99)

## 2018-03-24 LAB — MAGNESIUM: Magnesium: 1.8 mg/dL (ref 1.7–2.4)

## 2018-03-24 MED ORDER — MAGNESIUM SULFATE 2 GM/50ML IV SOLN
2.0000 g | Freq: Once | INTRAVENOUS | Status: AC
  Administered 2018-03-24: 2 g via INTRAVENOUS
  Filled 2018-03-24: qty 50

## 2018-03-24 MED ORDER — DOFETILIDE 250 MCG PO CAPS
250.0000 ug | ORAL_CAPSULE | Freq: Two times a day (BID) | ORAL | Status: DC
  Administered 2018-03-24: 250 ug via ORAL
  Filled 2018-03-24: qty 1

## 2018-03-24 NOTE — Care Management Important Message (Signed)
Important Message  Patient Details  Name: Cynthia Herrera MRN: [id number] Date of Birth: 04-25-1946   Medicare Important Message Given:  Yes    Signe Tackitt P Avyaan Summer 03/24/2018, 3:02 PM

## 2018-03-24 NOTE — Progress Notes (Addendum)
EKG this afternoon reviewed, SR, 62bpm,  QT has improved, I measure QT at 411ms, corrects to 456ms.  I discussed with Dr. Rayann Heman, will resume Tikosyn at 212mcg BID tonight, follow QT closely.  Tommye Standard, PA-C

## 2018-03-24 NOTE — Progress Notes (Signed)
Pharmacy Review for Dofetilide (Tikosyn) Initiation  Admit Complaint: 72 y.o. female admitted 03/21/2018 with atrial fibrillation to be initiated on dofetilide.   Assessment: Doses were held since start time on 5/28 due to prolonged QTc - has decreased today. Discussed and okay to give per EP.  Patient Exclusion Criteria: If any screening criteria checked as "Yes", then  patient  should NOT receive dofetilide until criteria item is corrected. If "Yes" please indicate correction plan. YES  NO Patient  Exclusion Criteria Correction Plan  [x]  []  Baseline QTc interval is greater than or equal to 440 msec. IF above YES box checked dofetilide contraindicated unless patient has ICD; then may proceed if QTc 500-550 msec or with known ventricular conduction abnormalities may proceed with QTc 550-600 msec. QTc = 0.52 QTc 488 - MD aware and starting reduced dose and monitoring QTc  []  [x]  Magnesium level is less than 1.8 mEq/l : Last magnesium:  Lab Results  Component Value Date   MG 1.8 03/24/2018       Received 2 g IV once after labs this morning  []  [x]  Potassium level is less than 4 mEq/l : Last potassium:  Lab Results  Component Value Date   K 4.1 03/24/2018         []  [x]  Patient is known or suspected to have a digoxin level greater than 2 ng/ml: Lab Results  Component Value Date   DIGOXIN 0.8 11/23/2013      []  [x]  Creatinine clearance less than 20 ml/min (calculated using Cockcroft-Gault, actual body weight and serum creatinine): Estimated Creatinine Clearance: 56 mL/min (A) (by C-G formula based on SCr of 1.13 mg/dL (H)).    []  [x]  Patient has received drugs known to prolong the QT intervals within the last 48 hours (phenothiazines, tricyclics or tetracyclic antidepressants, erythromycin, H-1 antihistamines, cisapride, fluoroquinolones, azithromycin). Drugs not listed above may have an, as yet, undetected potential to prolong the QT interval, updated information on QT prolonging  agents is available at this website:QT prolonging agents   []  [x]  Patient received a dose of hydrochlorothiazide (Oretic) alone or in any combination including triamterene (Dyazide, Maxzide) in the last 48 hours.   []  [x]  Patient received a medication known to increase dofetilide plasma concentrations prior to initial dofetilide dose:  . Trimethoprim (Primsol, Proloprim) in the last 36 hours . Verapamil (Calan, Verelan) in the last 36 hours or a sustained release dose in the last 72 hours . Megestrol (Megace) in the last 5 days  . Cimetidine (Tagamet) in the last 6 hours . Ketoconazole (Nizoral) in the last 24 hours . Itraconazole (Sporanox) in the last 48 hours  . Prochlorperazine (Compazine) in the last 36 hours    []  [x]  Patient is known to have a history of torsades de pointes; congenital or acquired long QT syndromes.   []  [x]  Patient has received a Class 1 antiarrhythmic with less than 2 half-lives since last dose. (Disopyramide, Quinidine, Procainamide, Lidocaine, Mexiletine, Flecainide, Propafenone)   []  [x]  Patient has received amiodarone therapy in the past 3 months or amiodarone level is greater than 0.3 ng/ml.    Patient has been appropriately anticoagulated with Xarelto.  Doylene Canard, PharmD Clinical Pharmacist  Pager: [phone] Phone: [phone] 5:26 PM 03/24/2018

## 2018-03-24 NOTE — Progress Notes (Addendum)
Progress Note  Patient Name: Cynthia Herrera Date of Encounter: 03/24/2018  Primary Cardiologist: Dr. Radford Pax  Subjective   No complaints, no CP, palpitations, or SOB  Inpatient Medications    Scheduled Meds: . atorvastatin  40 mg Oral q1800  . cholecalciferol  2,000 Units Oral Daily  . clotrimazole-betamethasone  1 application Topical BID  . furosemide  20 mg Oral BID  . letrozole  2.5 mg Oral Daily  . losartan  25 mg Oral BID  . metFORMIN  500 mg Oral BID WC  . nebivolol  5 mg Oral Daily  . pneumococcal 23 valent vaccine  0.5 mL Intramuscular Tomorrow-1000  . potassium chloride SA  20 mEq Oral Daily  . rivaroxaban  20 mg Oral Q supper  . sodium chloride flush  3 mL Intravenous Q12H  . sodium chloride flush  3 mL Intravenous Q12H  . spironolactone  25 mg Oral Daily   Continuous Infusions: . sodium chloride    . sodium chloride 1 mL/hr at 03/23/18 1600   PRN Meds: sodium chloride, sodium chloride flush, sodium chloride flush   Vital Signs    Vitals:   03/23/18 1354 03/23/18 2332 03/24/18 0631 03/24/18 0631  BP: (!) 91/58 108/77  (!) 109/56  Pulse: (!) 59 61  60  Resp:  19  17  Temp: 97.8 F (36.6 C) (!) 97.5 F (36.4 C)  97.7 F (36.5 C)  TempSrc: Oral Oral  Oral  SpO2: 96% 99%  99%  Weight:   238 lb (108 kg)   Height:        Intake/Output Summary (Last 24 hours) at 03/24/2018 0944 Last data filed at 03/23/2018 1832 Gross per 24 hour  Intake 683.43 ml  Output -  Net 683.43 ml   Filed Weights   03/22/18 0644 03/23/18 0645 03/24/18 0631  Weight: 240 lb 11.2 oz (109.2 kg) 239 lb 6.4 oz (108.6 kg) 238 lb (108 kg)    Telemetry    SR, occ PVC's (known), one brief PAT, Personally Reviewed  ECG    SR, 64bpm, QTc remains prolonged - Personally Reviewed  Physical Exam   GEN: No acute distress.   Neck: No JVD Cardiac: RRR, no murmurs, rubs, or gallops.  Respiratory: CTA b/l GI: Soft, nontender, non-distended  MS: No edema; No deformity. Neuro:   Nonfocal  Psych: Normal affect   Labs    Chemistry Recent Labs  Lab 03/22/18 0538 03/23/18 0353 03/24/18 0338  NA 138 139 137  K 4.2 3.7 4.1  CL 103 104 107  CO2 24 24 25   GLUCOSE 121* 118* 114*  BUN 29* 27* 29*  CREATININE 1.17* 1.11* 1.13*  CALCIUM 9.4 9.5 9.3  GFRNONAA 45* 48* 47*  GFRAA 53* 56* 55*  ANIONGAP 11 11 5      HematologyNo results for input(s): WBC, RBC, HGB, HCT, MCV, MCH, MCHC, RDW, PLT in the last 168 hours.  Cardiac EnzymesNo results for input(s): TROPONINI in the last 168 hours. No results for input(s): TROPIPOC in the last 168 hours.   BNPNo results for input(s): BNP, PROBNP in the last 168 hours.   DDimer No results for input(s): DDIMER in the last 168 hours.   Radiology    No results found.  Cardiac Studies   2D echo 03/03/2018 Study Conclusions - Left ventricle: The cavity size was mildly dilated. Wall thickness was increased in a pattern of mild LVH. Indeterminant diastolic function (atrial fibrillation). The estimated ejection fraction was 25%. Diffuse hypokinesis. - Aortic  valve: There was no stenosis. - Mitral valve: Mildly calcified annulus. There was mild regurgitation. - Left atrium: The atrium was mildly dilated. - Right ventricle: The cavity size was normal. Systolic function was normal. - Right atrium: The atrium was mildly dilated. - Tricuspid valve: Peak RV-RA gradient (S): 26 mm Hg. - Pulmonary arteries: PA peak pressure: 29 mm Hg (S). - Inferior vena cava: The vessel was normal in size. The respirophasic diameter changes were in the normal range (>= 50%), consistent with normal central venous pressure. - Pericardium, extracardiac: A trivial pericardial effusion was identified. Impressions: - The patient was in atrial fibrillation. Mildly dilated LV with mild LV hypertrophy. EF 25%, diffuse hypokinesis. Normal RV size and systolic function. Mild MR.  Patient Profile     72 y.o. female with a  history of non-obstructive CAD, NICM that had improved though down again by last echo (? tacy-mediated), HTN, HLD, chronic CHF (systolic), CBP, COPD, GERD, breast cancer treated, DM, persistent AFib, admitted for Tikosyn initiation  Assessment & Plan    1. Persistent AFib, here for Tikosn initiation     CHA2DS2Vasc is 6, on Xarelto     K+ 4.1     Mag 1.8 (replaced)     Creat 1.13 (stable)     I have reviewed EKGs with Dr. Rayann Heman this AM QT remains prolonged despite no drug last PM       Plan for EKG this afternoon to guide therapy decision, hopefully can resume this evening.      Dr. Rayann Heman discussed with the patient, will need to stay to Sunday if drug resumed, possibility will not be able to use Tikosyn      I have reviewed patient use of Diflucan with RPH here, 1/2 life is 30 hours to as long as 50 hours in elderly, recommended at least waiting 4 days (three 1/2 lives).   Discussed with the patient, she prefered to pursue Tikosyn rather then waiting to complete the 6 week course of Diflucan and will discuss with her podiatrist next week, though states doing much better, reviewed Lotrisone cream with RPH, OK to continue   2. DM     Continue home regime  3. HTN     Continue home meds    For questions or updates, please contact [place] Please consult [email or web address] for contact info under Cardiology/STEMI.      Signed, Baldwin Jamaica, PA-C  03/24/2018, 9:44 AM    I have seen, examined the patient, and reviewed the above assessment and plan.  Changes to above are made where necessary.  On exam, RRR.  Unfortunately, QT is still too long (520 msec).  Will hold another dose of tikosyn this am (didn't take last night either) and repeat ekg this afternoon.  Consider tikosyn 250 mcg starting tonight if qt allows.  I had a frank conversation with her that I worry that she may not be a tikosyn candidate long term.  Will try to continue tikosyn tonight if her QT allows.  If her QT  remains prolonged, may have to consider short term amiodarone and ablation.   Assuming we are able to continue tikosyn, she will remain in the hospital until at least Sunday, if not Monday. If her QT is too long, she may go home tomorrow without any further tikosyn.  Co Sign: Thompson Grayer, MD 03/24/2018 9:55 AM

## 2018-03-25 DIAGNOSIS — Z5181 Encounter for therapeutic drug level monitoring: Secondary | ICD-10-CM

## 2018-03-25 DIAGNOSIS — Z79899 Other long term (current) drug therapy: Secondary | ICD-10-CM

## 2018-03-25 LAB — BASIC METABOLIC PANEL
ANION GAP: 9 (ref 5–15)
BUN: 26 mg/dL — ABNORMAL HIGH (ref 6–20)
CALCIUM: 9.6 mg/dL (ref 8.9–10.3)
CO2: 26 mmol/L (ref 22–32)
CREATININE: 1.13 mg/dL — AB (ref 0.44–1.00)
Chloride: 101 mmol/L (ref 101–111)
GFR calc non Af Amer: 47 mL/min — ABNORMAL LOW (ref >60)
GFR, EST AFRICAN AMERICAN: 55 mL/min — AB (ref >60)
Glucose, Bld: 115 mg/dL — ABNORMAL HIGH (ref 65–99)
Potassium: 4.2 mmol/L (ref 3.5–5.1)
Sodium: 136 mmol/L (ref 135–145)

## 2018-03-25 LAB — MAGNESIUM: Magnesium: 2.1 mg/dL (ref 1.7–2.4)

## 2018-03-25 MED ORDER — DOFETILIDE 125 MCG PO CAPS
125.0000 ug | ORAL_CAPSULE | Freq: Two times a day (BID) | ORAL | Status: DC
  Administered 2018-03-25 (×2): 125 ug via ORAL
  Filled 2018-03-25 (×2): qty 1

## 2018-03-25 NOTE — Progress Notes (Signed)
QTC 525 3 hours after pt took [employer]

## 2018-03-25 NOTE — Progress Notes (Signed)
QTC 471, Tikosyn administered.

## 2018-03-25 NOTE — Progress Notes (Signed)
QTC 492 before Tikosyn, 505 three hours after patient took Tikosyn.

## 2018-03-25 NOTE — Progress Notes (Signed)
Progress Note  Patient Name: Cynthia Herrera Date of Encounter: 03/25/2018  Primary Cardiologist: Dr. Radford Pax  Subjective   NO complaints, feeling well.  Inpatient Medications    Scheduled Meds: . atorvastatin  40 mg Oral q1800  . cholecalciferol  2,000 Units Oral Daily  . clotrimazole-betamethasone  1 application Topical BID  . dofetilide  125 mcg Oral BID  . furosemide  20 mg Oral BID  . letrozole  2.5 mg Oral Daily  . losartan  25 mg Oral BID  . metFORMIN  500 mg Oral BID WC  . nebivolol  5 mg Oral Daily  . pneumococcal 23 valent vaccine  0.5 mL Intramuscular Tomorrow-1000  . potassium chloride SA  20 mEq Oral Daily  . rivaroxaban  20 mg Oral Q supper  . sodium chloride flush  3 mL Intravenous Q12H  . sodium chloride flush  3 mL Intravenous Q12H  . spironolactone  25 mg Oral Daily   Continuous Infusions: . sodium chloride    . sodium chloride 1 mL/hr at 03/23/18 1600   PRN Meds: sodium chloride, sodium chloride flush, sodium chloride flush   Vital Signs    Vitals:   03/24/18 0631 03/24/18 1459 03/24/18 1944 03/25/18 0510  BP: (!) 109/56 106/60 (!) 102/57 (!) 116/52  Pulse: 60 63 65 61  Resp: 17  (!) 22 (!) 22  Temp: 97.7 F (36.5 C) (!) 97.4 F (36.3 C) 97.7 F (36.5 C) 98.4 F (36.9 C)  TempSrc: Oral Oral Oral Oral  SpO2: 99% 96% 99% 94%  Weight:    237 lb 8 oz (107.7 kg)  Height:        Intake/Output Summary (Last 24 hours) at 03/25/2018 0830 Last data filed at 03/24/2018 1845 Gross per 24 hour  Intake 480 ml  Output -  Net 480 ml   Filed Weights   03/23/18 0645 03/24/18 0631 03/25/18 0510  Weight: 239 lb 6.4 oz (108.6 kg) 238 lb (108 kg) 237 lb 8 oz (107.7 kg)    Telemetry    SR, Personally Reviewed  ECG    SR, QTc remains prolonged - Personally Reviewed  Physical Exam   GEN: Well nourished, well developed, in no acute distress  HEENT: normal  Neck: no JVD, carotid bruits, or masses Cardiac: RRR; no murmurs, rubs, or gallops,no  edema  Respiratory:  clear to auscultation bilaterally, normal work of breathing GI: soft, nontender, nondistended, + BS MS: no deformity or atrophy  Skin: warm and dry Neuro:  Strength and sensation are intact Psych: euthymic mood, full affect   Labs    Chemistry Recent Labs  Lab 03/23/18 0353 03/24/18 0338 03/25/18 0511  NA 139 137 136  K 3.7 4.1 4.2  CL 104 107 101  CO2 24 25 26   GLUCOSE 118* 114* 115*  BUN 27* 29* 26*  CREATININE 1.11* 1.13* 1.13*  CALCIUM 9.5 9.3 9.6  GFRNONAA 48* 47* 47*  GFRAA 56* 55* 55*  ANIONGAP 11 5 9      HematologyNo results for input(s): WBC, RBC, HGB, HCT, MCV, MCH, MCHC, RDW, PLT in the last 168 hours.  Cardiac EnzymesNo results for input(s): TROPONINI in the last 168 hours. No results for input(s): TROPIPOC in the last 168 hours.   BNPNo results for input(s): BNP, PROBNP in the last 168 hours.   DDimer No results for input(s): DDIMER in the last 168 hours.   Radiology    No results found.  Cardiac Studies   2D echo 03/03/2018 Study  Conclusions - Left ventricle: The cavity size was mildly dilated. Wall thickness was increased in a pattern of mild LVH. Indeterminant diastolic function (atrial fibrillation). The estimated ejection fraction was 25%. Diffuse hypokinesis. - Aortic valve: There was no stenosis. - Mitral valve: Mildly calcified annulus. There was mild regurgitation. - Left atrium: The atrium was mildly dilated. - Right ventricle: The cavity size was normal. Systolic function was normal. - Right atrium: The atrium was mildly dilated. - Tricuspid valve: Peak RV-RA gradient (S): 26 mm Hg. - Pulmonary arteries: PA peak pressure: 29 mm Hg (S). - Inferior vena cava: The vessel was normal in size. The respirophasic diameter changes were in the normal range (>= 50%), consistent with normal central venous pressure. - Pericardium, extracardiac: A trivial pericardial effusion was identified. Impressions: -  The patient was in atrial fibrillation. Mildly dilated LV with mild LV hypertrophy. EF 25%, diffuse hypokinesis. Normal RV size and systolic function. Mild MR.  Patient Profile     72 y.o. female with a history of non-obstructive CAD, NICM that had improved though down again by last echo (? tacy-mediated), HTN, HLD, chronic CHF (systolic), CBP, COPD, GERD, breast cancer treated, DM, persistent AFib, admitted for Tikosyn initiation  Assessment & Plan    1. Persistent AFib, here for Tikosn initiation: CHADS2VASc 6 on Xarelto. K 4.2, Mg 2.1. QTc remains prolonged on ECG. Nadia Viar decrease dose to 125 mcg.      2. DM     Continue home meds  3. HTN     Continue home meds   For questions or updates, please contact [place] Please consult [email or web address] for contact info under Cardiology/STEMI.      Signed, Darell Saputo Meredith Leeds, MD  03/25/2018, 8:30 AM

## 2018-03-26 LAB — BASIC METABOLIC PANEL
Anion gap: 9 (ref 5–15)
BUN: 25 mg/dL — AB (ref 6–20)
CHLORIDE: 102 mmol/L (ref 101–111)
CO2: 24 mmol/L (ref 22–32)
Calcium: 9.6 mg/dL (ref 8.9–10.3)
Creatinine, Ser: 1.07 mg/dL — ABNORMAL HIGH (ref 0.44–1.00)
GFR calc Af Amer: 59 mL/min — ABNORMAL LOW (ref >60)
GFR calc non Af Amer: 51 mL/min — ABNORMAL LOW (ref >60)
GLUCOSE: 111 mg/dL — AB (ref 65–99)
Potassium: 3.8 mmol/L (ref 3.5–5.1)
Sodium: 135 mmol/L (ref 135–145)

## 2018-03-26 LAB — MAGNESIUM: Magnesium: 2.1 mg/dL (ref 1.7–2.4)

## 2018-03-26 MED ORDER — AMIODARONE HCL 200 MG PO TABS: 200.0000 mg | ORAL_TABLET | Freq: Every day | ORAL | 11 refills | Status: DC

## 2018-03-26 NOTE — Progress Notes (Signed)
Plan for discharge today. QTc prolonged on 130mcg tikosyn. Improved today after holding dose. Plan for discharge with amiodarone and outpatient follow up for possible ablation discussion.  Allegra Lai, MD

## 2018-03-26 NOTE — Progress Notes (Signed)
Patient received discharge information and acknowledged understanding of it. Patient IV was removed.  

## 2018-03-26 NOTE — Discharge Summary (Addendum)
Discharge Summary    Patient ID: Cynthia Herrera,  MRN: [id number], DOB/AGE: 73-May-1947 73 y.o.  Admit date: 03/21/2018 Discharge date: 03/26/2018  Primary Care Provider: Marletta Lor Primary Cardiologist: Dr Radford Pax Primary Electrophysiologist: Dr Rayann Heman  Discharge Diagnoses    Principal Problem:   Visit for monitoring Tikosyn therapy Active Problems:   Persistent atrial fibrillation (HCC)   Allergies Allergies  Allergen Reactions  . Tikosyn [Dofetilide]     Prolonged QT/QTc  . Ambien [Zolpidem] Other (See Comments)    Hallucinations, up walking around  . Codeine Phosphate Nausea And Vomiting  . Medrol [Methylprednisolone] Other (See Comments)    Felt really weird w the high dose oral steroid, but tolerates low doses or oral steroid    Diagnostic Studies/Procedures    Serial ECGs _____________   History of Present Illness     72 y.o. female with a history of non-obstructive CAD, NICM that had improved though down again by last echo (? tachycardia-mediated), HTN, HLD, chronic CHF (systolic), CBP, COPD, GERD, breast cancer treated, DM, persistent AFib,CHADS2VASc 6 on Xarelto.   Cynthia Herrera has persistent AFib, historically failed amiodarone 2/2 dizziness, this was stopped back in 2018, she has had recurrent, symptomatic AFib. She was admitted for Tikosyn initiation on 03/21/2018.  Hospital Course     Consultants: None   She was initially started on Tikosyn 500 mcg twice daily.  Her potassium and magnesium were supplemented as needed.  She had been on Diflucan, but this was stopped and Tikosyn was not initiated until she had been off it for 4 days.  Her telemetry and her QT/QTc intervals were followed closely.  On 530, her QT/QTC was 532/522, prolonged from baseline.  The Tikosyn was held.  Her QT/QTc improved to 480/488.  She was restarted on Tikosyn at 250 mcg twice daily.  On 250 mcg, her QTC went from 492 up to the 505, 3 hours after the medication was  given.  The dose was decreased to 125 mcg twice daily.    3 hours after Tikosyn 125 mcg was given, her QTC went from 470 want up to 525 ms.  Dr. Curt Bears reviewed the data and felt that Tikosyn was not an appropriate drug for her.  He felt the best option was re-challenging her on amiodarone and using dose adjustments to minimize side effects.  She is currently maintaining sinus rhythm with occasional PVCs.  She Micaila Ziemba follow-up with her podiatrist to discuss resuming the Diflucan versus other treatment.  No further inpatient work-up is indicated and she is considered stable for discharge, to follow-up as an outpatient. _____________  Discharge Vitals Blood pressure 114/66, pulse 64, temperature 97.6 F (36.4 C), temperature source Oral, resp. rate (!) 21, height 5\' 6"  (1.676 m), weight 236 lb 12.8 oz (107.4 kg), SpO2 97 %.  Filed Weights   03/24/18 0631 03/25/18 0510 03/26/18 0524  Weight: 238 lb (108 kg) 237 lb 8 oz (107.7 kg) 236 lb 12.8 oz (107.4 kg)  General: Well developed, well nourished, female in no acute distress Head: Eyes PERRLA, No xanthomas.   Normocephalic and atraumatic  Lungs: Clear bilaterally to auscultation. Heart: HRRR S1 S2, without MRG.  Pulses are 2+ & equal.No JVD. Abdomen: Bowel sounds are present, abdomen soft and non-tender without masses or  hernias noted. Msk: Normal strength and tone for age. Extremities: No clubbing, cyanosis or edema.    Skin:  No rashes or lesions noted. Neuro: Alert and oriented X 3. Psych:  Kermit Balo  affect, responds appropriately   Labs & Radiologic Studies    Basic Metabolic Panel Recent Labs    03/25/18 0511 03/26/18 0521  NA 136 135  K 4.2 3.8  CL 101 102  CO2 26 24  GLUCOSE 115* 111*  BUN 26* 25*  CREATININE 1.13* 1.07*  CALCIUM 9.6 9.6  MG 2.1 2.1  _____________   Disposition   Pt is being discharged home today in good condition.  Follow-up Plans & Appointments    Follow-up Information    [facility] Follow up on 03/30/2018.   Specialty:  Cardiology Why:  11:30AM Contact information: [address] [id number] [place] [phone]       Thompson Grayer, MD Follow up on 04/24/2018.   Specialty:  Cardiology Why:  12:30PM Contact information: [place] [address] [phone]        Marletta Lor, MD .   Specialty:  Internal Medicine Contact information: [place] [address] [phone]          Discharge Instructions    Diet - low sodium heart healthy   Complete by:  As directed    Increase activity slowly   Complete by:  As directed       Discharge Medications   Allergies as of 03/26/2018      Reactions   Tikosyn [dofetilide]    Prolonged QT/QTc   Ambien [zolpidem] Other (See Comments)   Hallucinations, up walking around   Codeine Phosphate Nausea And Vomiting   Medrol [methylprednisolone] Other (See Comments)   Felt really weird w the high dose oral steroid, but tolerates low doses or oral steroid      Medication List    TAKE these medications   amiodarone 200 MG tablet Commonly known as:  PACERONE Take 1 tablet (200 mg total) by mouth daily. 2 tabs bid x 1 week, 1 tab bid x 1 week, then 1 tab daily   amoxicillin 500 MG capsule Commonly known as:  AMOXIL Take 2,000 mg by mouth See admin instructions. Take 2000 mg by mouth 1 hour prior to dental appointment   atorvastatin 40 MG tablet Commonly known as:  LIPITOR TAKE 1 TABLET (40 MG TOTAL) BY MOUTH AT BEDTIME.   clotrimazole-betamethasone cream Commonly known as:  LOTRISONE Apply 1 application topically 2 (two) times daily.   fexofenadine 180 MG tablet Commonly known as:  ALLEGRA Take 180 mg by mouth daily as needed for allergies or rhinitis.   fluticasone 50 MCG/ACT nasal spray Commonly known as:  FLONASE Place 1 spray into both nostrils daily as needed for allergies.     furosemide 20 MG tablet Commonly known as:  LASIX Take 1 tablet (20 mg total) by mouth 2 (two) times daily.   glucose blood test strip Use to check CBG AC and QHS.   KLOR-CON M20 20 MEQ tablet Generic drug:  potassium chloride SA Take 1 tablet (20 mEq total) by mouth daily.   letrozole 2.5 MG tablet Commonly known as:  FEMARA Take 1 tablet (2.5 mg total) by mouth daily.   losartan 25 MG tablet Commonly known as:  COZAAR TAKE 1 TABLET BY MOUTH TWICE A DAY   metFORMIN 500 MG tablet Commonly known as:  GLUCOPHAGE TAKE 1 TABLET TWICE A DAY WITH A MEAL   nebivolol 5 MG tablet Commonly known as:  BYSTOLIC Take 1 tablet (5 mg total) by mouth daily.   spironolactone 25  MG tablet Commonly known as:  ALDACTONE Take 1 tablet (25 mg total) by mouth daily.   traMADol 50 MG tablet Commonly known as:  ULTRAM Take 1-2 tablets (50-100 mg total) by mouth every 6 (six) hours as needed. What changed:  reasons to take this   Vitamin D3 2000 units capsule Take 2,000 Units by mouth daily.   XARELTO 20 MG Tabs tablet Generic drug:  rivaroxaban TAKE 1 TABLET EVERY DAY WITH SUPPER         Outstanding Labs/Studies   None  Duration of Discharge Encounter   Greater than 30 minutes including physician time.  Alcario Drought Barrett NP 03/26/2018, 1:53 PM  I have seen and examined this patient with Rosaria Ferries.  Agree with above, note added to reflect my findings.  On exam, RRR, no murmurs, lungs clear. Admit for tikosyn loading. QTc prolonged on 125 mcg and thus not tolerated. Plan to start amiodarone and follow up in clinic for discussion of ablation.  Dametri Ozburn M. Shawnie Nicole MD 03/26/2018 3:10 PM

## 2018-03-27 ENCOUNTER — Telehealth: Payer: Self-pay

## 2018-03-27 NOTE — Telephone Encounter (Signed)
Left VM regarding referral for the PREP at [facility]

## 2018-03-28 NOTE — Consult Note (Signed)
            [facility] CM Primary Care Navigator  03/28/2018  Cynthia Herrera 1946/08/11 [id number]   Attempt to seepatient at the bedsideto identify possible discharge needs but she was alreadydischargedhome over the weekend.   PerMD note,patient was seen for Tikosyn loading for persistent atrial fibrillation.  Primary care provider's office is listed as providing transition of care (TOC) follow-up.  Patient has discharge instruction to follow-up with primary care provider, [facility] on 03/30/18 and cardiology on 04/24/18.   For additional questions please contact:  Edwena Felty A. Aveion Nguyen, BSN, RN-BC [facility] PRIMARY CARE Navigator Cell: [phone]

## 2018-03-30 ENCOUNTER — Ambulatory Visit: Payer: Medicare Other | Admitting: Podiatry

## 2018-03-30 ENCOUNTER — Ambulatory Visit (HOSPITAL_COMMUNITY)
Admit: 2018-03-30 | Discharge: 2018-03-30 | Disposition: A | Discharge status: Home / Self Care | Payer: Medicare Other | Attending: Nurse Practitioner | Admitting: Nurse Practitioner

## 2018-03-30 ENCOUNTER — Encounter (HOSPITAL_COMMUNITY): Payer: Self-pay | Admitting: Nurse Practitioner

## 2018-03-30 ENCOUNTER — Telehealth (HOSPITAL_COMMUNITY): Payer: Self-pay | Admitting: Cardiology

## 2018-03-30 VITALS — BP 120/68 | HR 81 | Ht 66.0 in | Wt 239.0 lb

## 2018-03-30 DIAGNOSIS — Z853 Personal history of malignant neoplasm of breast: Secondary | ICD-10-CM | POA: Diagnosis not present

## 2018-03-30 DIAGNOSIS — E559 Vitamin D deficiency, unspecified: Secondary | ICD-10-CM | POA: Insufficient documentation

## 2018-03-30 DIAGNOSIS — Z96653 Presence of artificial knee joint, bilateral: Secondary | ICD-10-CM | POA: Diagnosis not present

## 2018-03-30 DIAGNOSIS — B351 Tinea unguium: Secondary | ICD-10-CM

## 2018-03-30 DIAGNOSIS — E119 Type 2 diabetes mellitus without complications: Secondary | ICD-10-CM | POA: Diagnosis not present

## 2018-03-30 DIAGNOSIS — Z7984 Long term (current) use of oral hypoglycemic drugs: Secondary | ICD-10-CM | POA: Insufficient documentation

## 2018-03-30 DIAGNOSIS — Z888 Allergy status to other drugs, medicaments and biological substances status: Secondary | ICD-10-CM | POA: Insufficient documentation

## 2018-03-30 DIAGNOSIS — I4819 Other persistent atrial fibrillation: Secondary | ICD-10-CM

## 2018-03-30 DIAGNOSIS — I481 Persistent atrial fibrillation: Secondary | ICD-10-CM | POA: Diagnosis not present

## 2018-03-30 DIAGNOSIS — Z7901 Long term (current) use of anticoagulants: Secondary | ICD-10-CM | POA: Diagnosis not present

## 2018-03-30 DIAGNOSIS — I420 Dilated cardiomyopathy: Secondary | ICD-10-CM | POA: Insufficient documentation

## 2018-03-30 DIAGNOSIS — K219 Gastro-esophageal reflux disease without esophagitis: Secondary | ICD-10-CM | POA: Insufficient documentation

## 2018-03-30 DIAGNOSIS — I2510 Atherosclerotic heart disease of native coronary artery without angina pectoris: Secondary | ICD-10-CM | POA: Diagnosis not present

## 2018-03-30 DIAGNOSIS — Z9012 Acquired absence of left breast and nipple: Secondary | ICD-10-CM | POA: Diagnosis not present

## 2018-03-30 DIAGNOSIS — J449 Chronic obstructive pulmonary disease, unspecified: Secondary | ICD-10-CM | POA: Diagnosis not present

## 2018-03-30 DIAGNOSIS — M8580 Other specified disorders of bone density and structure, unspecified site: Secondary | ICD-10-CM | POA: Diagnosis not present

## 2018-03-30 DIAGNOSIS — I110 Hypertensive heart disease with heart failure: Secondary | ICD-10-CM | POA: Insufficient documentation

## 2018-03-30 DIAGNOSIS — E785 Hyperlipidemia, unspecified: Secondary | ICD-10-CM | POA: Diagnosis not present

## 2018-03-30 DIAGNOSIS — B353 Tinea pedis: Secondary | ICD-10-CM

## 2018-03-30 DIAGNOSIS — Z87891 Personal history of nicotine dependence: Secondary | ICD-10-CM | POA: Diagnosis not present

## 2018-03-30 DIAGNOSIS — I5022 Chronic systolic (congestive) heart failure: Secondary | ICD-10-CM | POA: Diagnosis not present

## 2018-03-30 DIAGNOSIS — Z79899 Other long term (current) drug therapy: Secondary | ICD-10-CM | POA: Insufficient documentation

## 2018-03-30 NOTE — Telephone Encounter (Signed)
Pts myoview was scheduled for 04/06/18 and 04/10/18.  Pt made aware of appt date and time by [employer].

## 2018-03-30 NOTE — Telephone Encounter (Signed)
New Message Patient left a message stating that someone called her to schedule a myoview. She voiced that she is currently still in A-fib and doesn't feel that she will be able to do the test. Please f/u and advise patient.

## 2018-03-30 NOTE — Telephone Encounter (Signed)
Please call the pt back and schedule her lexiscan (doesn't require walking on the treadmill).  As indicated below, Dr Radford Pax noted that the pt is in afib, she has a diagnosis of persistent a-fib, and that's why she is obtaining the lexiscan, to rule out ischemia (muscle damage to the heart due to poor perfusion).  Pt also saw Roderic Palau NP in [facility] today, and she also advised for the pt to proceed with ordered lexiscan.  Please call her back and schedule this.  Send lexiscan date to Dr Theodosia Blender RN.   Notes recorded by Sueanne Margarita, MD on 03/05/2018 at 3:17 PM EDT Patient was back in atrial fibrillation. 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 I would also like her to have a Lexiscan myoview to rule out ischemia.

## 2018-03-30 NOTE — Progress Notes (Signed)
  Subjective:  Patient ID: Cynthia Herrera, female    DOB: 03-Feb-1946,  MRN: [id number]  No chief complaint on file.  72 y.o. female returns for the above complaint.  States that her feet got better after taking fluconazole however had her meds changed and she was advised that she should not take the fluconazole.  Now she can only screams.  Objective:  There were no vitals filed for this visit. General AA&O x3. Normal mood and affect.  Vascular Pedal pulses palpable.  Neurologic Epicritic sensation grossly intact.  Dermatologic No open lesions. Skin normal texture and turgor.  Plantar xerosis with scaling resolved plantar xerosis only remains.  Nails x10 with yellow-brown discoloration thickening but some proximal clearing of the hallux nails  Orthopedic: No pain to palpation either foot.   Assessment & Plan:  Patient was evaluated and treated and all questions answered.  Tinea pedis and onychomycosis -Improved will avoid further fluconazole due to oral interactions.  Would benefit from topical antifungal cream.  Advised Penlac or patient did not wish to avoid putting toenail polish on her toenails.  Return in about 2 months (around 05/30/2018) for Nail Fungus.

## 2018-03-30 NOTE — Progress Notes (Signed)
Primary Care Physician: Marletta Lor, MD Referring Physician:Dr. Leeanna Slaby is a 72 y.o. female with a h/o Coronary calcifications on Coronary CT with nonobstructive CAD on cath, history of NICM which has improved LVEF 50-55% but now back down to 25%, HTN, dyslipidemia, PAF S/P TEE/DCCV but failed to hold NSR, repeat cardioversion 11/14/16 after more Amio load, but didn't convert, but converted later that day spontaneouly. Amio stopped secondary to severe dizziness in 2018. Last saw Dr. Radford Pax 02/20/18 and was back in Afib with RVR. She was on Xarelto and Bystolic increased. She underwent DCCV 02/22/18.  Patient was referred back to [place] by Dr. Marla Roe who planned to do breast reduction surgery on her 03/22/18 but she was back in  Afib with RVR.   Pt was seen in afib clinic, 5/20 and says she's going in/out of Afib, wants options to stay in SR. Gets short of breath with exertion. Goes away with rest.  2D echo on 03/03/2018 patient was back in atrial fibrillation and LVEF is now 25%.Wants to put off breast reduction surgery until her heart rhythm is addressed. She does not drink alcohol,caffeine. [employer] and although she will be in donut hole like next month, she was told still price would be  around $92 a month,  pt can afford that.  Pt was admitted forTikosyn and is in clinic 1 week later following hospitalization. Unfortunately, her qt prolonged and Tikosyn had to be stopped. It was determined to try amiodarone short term until she can be taken for ablation. She has appointment pending with Dr. Rayann Heman 7/1 to further discuss. She left hospital in [place] but is in afib rate controlled today.  Today, she denies symptoms of palpitations, chest pain, shortness of breath, orthopnea, PND, lower extremity edema, dizziness, presyncope, syncope, or neurologic sequela. The patient is tolerating medications without difficulties and is otherwise without complaint today.    Past Medical History:  Diagnosis Date  . Arthritis   . Breast cancer, left breast ([place]) 2002   "then treated w/chemo and radiation"  . Breast cancer, left breast ([place]) 07/14/2017   "tx'd w/mastectomy"  . Chronic back pain    h/o lumbar stenosis; "no problem since my OR" (`10/27/2017)  . Chronic systolic CHF (congestive heart failure) (HCC)    takes Furosemide and Aldactone daily  . Complication of anesthesia    last surgery iv med when going to sleep"burned as injected"  . COPD (chronic obstructive pulmonary disease) (H[facility])    no inhalers   . DCM (dilated cardiomyopathy) ([place])    EF 15-20% ? tachycardia induced - EF 50-55% on echo 2015  . Dyspnea    with exertion  . Dysrhythmia    afib  . Gallstones   . GERD (gastroesophageal reflux disease)    once in a while;depends on what she eats  . History of bronchitis    "chronic when I smoked; no problem since I quit in 2002" (10/27/2017)  . Hyperlipidemia   . Hypertension    takes Losartan and Metoprolol.   . Joint pain   . Muscle spasm    takes Flexeril daily as needed   . Nocturia   . Osteopenia   . Persistent atrial fibrillation ([place])    s/p TEE DCCV and repeat DCCV 11/14/2013  . Personal history of chemotherapy   . Personal history of colonic polyps    adenomas 03 and 08  . Personal history of radiation therapy   . Pneumonia  hx  . Seasonal allergies   . Thyroid nodule   . Type II diabetes mellitus ([place])   . Vitamin D deficiency    takes Vit D   Past Surgical History:  Procedure Laterality Date  . AXILLARY LYMPH NODE DISSECTION Left 10/2001   persistent intramammary node Archie Endo 03/09/2011  . BACK SURGERY    . BREAST BIOPSY Left 2002  . BREAST BIOPSY Right 06/2017   "node bx was negative"  . BREAST BIOPSY Left 06/2017   "positive for cancer"  . BREAST LUMPECTOMY WITH RADIOACTIVE SEED LOCALIZATION Right 08/15/2017   Procedure: RIGHT BREAST LUMPECTOMY WITH RADIOACTIVE SEED LOCALIZATION;  Surgeon: Jovita Kussmaul,  MD;  Location: [place];  Service: General;  Laterality: Right;  . CARDIAC CATHETERIZATION  2015  . CARDIOVERSION N/A 11/12/2013   Procedure: CARDIOVERSION;  Surgeon: Thayer Headings, MD;  Location: [facility] ENDOSCOPY;  Service: Cardiovascular;  Laterality: N/A;  10:08  Dr. Marissa Nestle, anesthesia present, [place]   60mg ,  propofol 50mg , IV for elective cardioversion....Dr. Cathie Olden delievered synch 120 joules with successful cardioversion to NSR  . CARDIOVERSION N/A 11/12/2013   Procedure: CARDIOVERSION;  Surgeon: Thayer Headings, MD;  Location: [place];  Service: Cardiovascular;  Laterality: N/A;  . CARDIOVERSION N/A 11/14/2013   Procedure: CARDIOVERSION (BEDSIDE);  Surgeon: Larey Dresser, MD;  Location: [place];  Service: Cardiovascular;  Laterality: N/A;  . CARDIOVERSION N/A 02/22/2018   Procedure: CARDIOVERSION;  Surgeon: Sueanne Margarita, MD;  Location[facility] ENDOSCOPY;  Service: Cardiovascular;  Laterality: N/A;  . CATARACT EXTRACTION W/ INTRAOCULAR LENS  IMPLANT, BILATERAL Bilateral ~ 2016  . COLONOSCOPY  2003, September 2008, April 01, 2011   adenomas 03 and 08, polyp 12, diverticulosis  . COLONOSCOPY WITH PROPOFOL N/A 11/16/2016   Procedure: COLONOSCOPY WITH PROPOFOL;  Surgeon: Gatha Mayer, MD;  Location: [facility] ENDOSCOPY;  Service: Endoscopy;  Laterality: N/A;  . JOINT REPLACEMENT    . LAPAROSCOPIC CHOLECYSTECTOMY  2004  . LEFT AND RIGHT HEART CATHETERIZATION WITH CORONARY ANGIOGRAM N/A 02/01/2014   Procedure: LEFT AND RIGHT HEART CATHETERIZATION WITH CORONARY ANGIOGRAM;  Surgeon: Sueanne Margarita, MD;  Location: [place] CATH LAB;  Service: Cardiovascular;  Laterality: N/A;  . LUMBAR LAMINECTOMY/DECOMPRESSION MICRODISCECTOMY N/A 03/19/2014   Procedure: LUMBAR LAMINECTOMY/DECOMPRESSION MICRODISCECTOMY 4 LEVEL;  Surgeon: Kristeen Miss, MD;  Location: [place] NEURO ORS;  Service: Neurosurgery;  Laterality: N/A;  L1-2 L2-3 L3-4 L4-5 Laminectomy/Foraminotomy  . MASS EXCISION Left 08/15/2017   Procedure: EXCISION OF LEFT BREAST  MASS;  Surgeon: Jovita Kussmaul, MD;  Location: [place];  Service: General;  Laterality: Left;  Marland Kitchen MASTECTOMY Left 10/27/2017  . MASTECTOMY PARTIAL / LUMPECTOMY W/ AXILLARY LYMPHADENECTOMY  05/08/2001   Archie Endo 03/09/2011  . PORT-A-CATH REMOVAL  2003  . PORTA CATH INSERTION  2002  . TEE WITHOUT CARDIOVERSION N/A 11/12/2013   Procedure: TRANSESOPHAGEAL ECHOCARDIOGRAM (TEE);  Surgeon: Thayer Headings, MD;  Location: [place];  Service: Cardiovascular;  Laterality: N/A;  pt b/p low, pt buccal membranes very dry, lips scapped, pt c/o thirst. NPO since MN and iv FLUIDS TOTAL INFUSING AT TOTAL 20ML HR....  Dr. Cathie Olden order allow NS to bolus during procedure....very dry,NS bolus 250 ml total..pt responding well to meds..  . TOTAL KNEE ARTHROPLASTY Left 2006  . TOTAL KNEE ARTHROPLASTY Right 05/10/2016   Procedure: RIGHT TOTAL KNEE ARTHROPLASTY;  Surgeon: Gaynelle Arabian, MD;  Location: [facility] ORS;  Service: Orthopedics;  Laterality: Right;  With adductor block  . TOTAL MASTECTOMY Left 10/27/2017   Procedure: LEFT MASTECTOMY;  Surgeon: Jovita Kussmaul, MD;  Location: [place];  Service: General;  Laterality: Left;  . TUBAL LIGATION      Current Outpatient Medications  Medication Sig Dispense Refill  . amiodarone (PACERONE) 200 MG tablet Take 1 tablet (200 mg total) by mouth daily. 2 tabs bid x 1 week, 1 tab bid x 1 week, then 1 tab daily 60 tablet 11  . amoxicillin (AMOXIL) 500 MG capsule Take 2,000 mg by mouth See admin instructions. Take 2000 mg by mouth 1 hour prior to dental appointment  5  . atorvastatin (LIPITOR) 40 MG tablet TAKE 1 TABLET (40 MG TOTAL) BY MOUTH AT BEDTIME. 90 tablet 3  . Cholecalciferol (VITAMIN D3) 2000 units capsule Take 2,000 Units by mouth daily.    . clotrimazole-betamethasone (LOTRISONE) cream Apply 1 application topically 2 (two) times daily. 30 g 0  . fexofenadine (ALLEGRA) 180 MG tablet Take 180 mg by mouth daily as needed for allergies or rhinitis.    . fluticasone (FLONASE) 50  MCG/ACT nasal spray Place 1 spray into both nostrils daily as needed for allergies.     . furosemide (LASIX) 20 MG tablet Take 1 tablet (20 mg total) by mouth 2 (two) times daily. 180 tablet 3  . glucose blood test strip Use to check CBG AC and QHS. 200 each 12  . KLOR-CON M20 20 MEQ tablet Take 1 tablet (20 mEq total) by mouth daily. 90 tablet 3  . letrozole (FEMARA) 2.5 MG tablet Take 1 tablet (2.5 mg total) by mouth daily. 90 tablet 3  . losartan (COZAAR) 25 MG tablet TAKE 1 TABLET BY MOUTH TWICE A DAY 180 tablet 3  . metFORMIN (GLUCOPHAGE) 500 MG tablet TAKE 1 TABLET TWICE A DAY WITH A MEAL 180 tablet 1  . nebivolol (BYSTOLIC) 5 MG tablet Take 1 tablet (5 mg total) by mouth daily. 90 tablet 3  . spironolactone (ALDACTONE) 25 MG tablet Take 1 tablet (25 mg total) by mouth daily. 90 tablet 3  . traMADol (ULTRAM) 50 MG tablet Take 1-2 tablets (50-100 mg total) by mouth every 6 (six) hours as needed. (Patient taking differently: Take 50-100 mg by mouth every 6 (six) hours as needed for moderate pain. ) 20 tablet 0  . XARELTO 20 MG TABS tablet TAKE 1 TABLET EVERY DAY WITH SUPPER 30 tablet 6   No current facility-administered medications for this encounter.     Allergies  Allergen Reactions  . Tikosyn [Dofetilide]     Prolonged QT/QTc  . Ambien [Zolpidem] Other (See Comments)    Hallucinations, up walking around  . Codeine Phosphate Nausea And Vomiting  . Medrol [Methylprednisolone] Other (See Comments)    Felt really weird w the high dose oral steroid, but tolerates low doses or oral steroid    Social History   Socioeconomic History  . Marital status: Widowed    Spouse name: Not on file  . Number of children: 2  . Years of education: Not on file  . Highest education level: Not on file  Occupational History  . Occupation: retired  Scientific laboratory technician  . Financial resource strain: Not on file  . Food insecurity:    Worry: Not on file    Inability: Not on file  . Transportation needs:      Medical: Not on file    Non-medical: Not on file  Tobacco Use  . Smoking status: Former Smoker    Packs/day: 1.50    Years: 35.00    Pack years: 52.50  Types: Cigarettes    Last attempt to quit: 08/24/2001    Years since quitting: 16.6  . Smokeless tobacco: Never Used  Substance and Sexual Activity  . Alcohol use: No  . Drug use: No  . Sexual activity: Never    Birth control/protection: Post-menopausal  Lifestyle  . Physical activity:    Days per week: Not on file    Minutes per session: Not on file  . Stress: Not on file  Relationships  . Social connections:    Talks on phone: Not on file    Gets together: Not on file    Attends religious service: Not on file    Active member of club or organization: Not on file    Attends meetings of clubs or organizations: Not on file    Relationship status: Not on file  . Intimate partner violence:    Fear of current or ex partner: Not on file    Emotionally abused: Not on file    Physically abused: Not on file    Forced sexual activity: Not on file  Other Topics Concern  . Not on file  Social History Narrative  . Not on file    Family History  Problem Relation Age of Onset  . Dementia Sister   . Diabetes Sister   . Alzheimer's disease Sister   . Diabetes Brother        x 2  . Heart disease Brother   . Irritable bowel syndrome Brother   . Liver cancer Mother   . Diabetes Mother   . Pancreatic cancer Mother   . Diabetes Father   . Heart disease Father     ROS- All systems are reviewed and negative except as per the HPI above  Physical Exam: Vitals:   03/30/18 1120  BP: 120/68  Pulse: 81  Weight: 239 lb (108.4 kg)  Height: 5\' 6"  (1.676 m)   Wt Readings from Last 3 Encounters:  03/30/18 239 lb (108.4 kg)  03/26/18 236 lb 12.8 oz (107.4 kg)  03/21/18 240 lb (108.9 kg)    Labs: Lab Results  Component Value Date   NA 135 03/26/2018   K 3.8 03/26/2018   CL 102 03/26/2018   CO2 24 03/26/2018   GLUCOSE  111 (H) 03/26/2018   BUN 25 (H) 03/26/2018   CREATININE 1.07 (H) 03/26/2018   CALCIUM 9.6 03/26/2018   MG 2.1 03/26/2018   Lab Results  Component Value Date   INR 1.10 01/02/2018   Lab Results  Component Value Date   CHOL 121 03/03/2018   HDL 39 (L) 03/03/2018   LDLCALC 56 03/03/2018   TRIG 128 03/03/2018     GEN- The patient is well appearing, alert and oriented x 3 today.   Head- normocephalic, atraumatic Eyes-  Sclera clear, conjunctiva pink Ears- hearing intact Oropharynx- clear Neck- supple, no JVP Lymph- no cervical lymphadenopathy Lungs- Clear to ausculation bilaterally, normal work of breathing Heart-irregular rate and rhythm, no murmurs, rubs or gallops, PMI not laterally displaced GI- soft, NT, ND, + BS Extremities- no clubbing, cyanosis, or edema MS- no significant deformity or atrophy Skin- no rash or lesion Psych- euthymic mood, full affect Neuro- strength and sensation are intact  EKG-afib at 81 bpm, qrs int 90 ms, qtc at 466 ms Epic records reviewed Echo-Study Conclusions  - Left ventricle: The cavity size was mildly dilated. Wall   thickness was increased in a pattern of mild LVH. Indeterminant   diastolic function (atrial fibrillation). The estimated  ejection   fraction was 25%. Diffuse hypokinesis. - Aortic valve: There was no stenosis. - Mitral valve: Mildly calcified annulus. There was mild   regurgitation. - Left atrium: The atrium was mildly dilated. - Right ventricle: The cavity size was normal. Systolic function   was normal. - Right atrium: The atrium was mildly dilated. - Tricuspid valve: Peak RV-RA gradient (S): 26 mm Hg. - Pulmonary arteries: PA peak pressure: 29 mm Hg (S). - Inferior vena cava: The vessel was normal in size. The   respirophasic diameter changes were in the normal range (>= 50%),   consistent with normal central venous pressure. - Pericardium, extracardiac: A trivial pericardial effusion was    identified.  Impressions:  - The patient was in atrial fibrillation. Mildly dilated LV with   mild LV hypertrophy. EF 25%, diffuse hypokinesis. Normal RV size   and systolic function. Mild MR.  ------------------------------------------------------------------- Study data:  Comparison was made to the study of 08/26/2014.  Study status:  Routine.  Procedure:  The patient reported no pain pre or post test. Transthoracic echocardiography for left ventricular function evaluation. Image quality was adequate.  Study completion:  There were no complications.          Transthoracic echocardiography.  M-mode, complete 2D, spectral Doppler, and color Doppler.  Birthdate:  Patient birthdate: [birth year].  Age:  Patient is 72 yr old.  Sex:  Gender: female.    BMI: 40.4 kg/m^2.  Blood pressure:     126/64  Patient status:  Outpatient.  Study date: Study date: 03/03/2018. Study time: 10:20 AM.  Location:  [place] [facility]  -------------------------------------------------------------------  ------------------------------------------------------------------- Left ventricle:  The cavity size was mildly dilated. Wall thickness was increased in a pattern of mild LVH. Indeterminant diastolic function (atrial fibrillation). The estimated ejection fraction was 25%. Diffuse hypokinesis.  ------------------------------------------------------------------- Aortic valve:   Trileaflet; mildly calcified leaflets.  Doppler: There was no stenosis.   There was no regurgitation.  ------------------------------------------------------------------- Aorta:  Aortic root: The aortic root was normal in size. Ascending aorta: The ascending aorta was normal in size.  ------------------------------------------------------------------- Mitral valve:   Mildly calcified annulus.  Doppler:   There was no evidence for stenosis.   There was mild regurgitation.    Peak gradient (D): 8 mm  Hg.  ------------------------------------------------------------------- Left atrium:  The atrium was mildly dilated.  ------------------------------------------------------------------- Right ventricle:  The cavity size was normal. Systolic function was normal.  ------------------------------------------------------------------- Pulmonic valve:    Structurally normal valve.   Cusp separation was normal.  Doppler:  Transvalvular velocity was within the normal range. There was trivial regurgitation.  ------------------------------------------------------------------- Tricuspid valve:   Doppler:  There was trivial regurgitation.   ------------------------------------------------------------------- Right atrium:  The atrium was mildly dilated.  ------------------------------------------------------------------- Pericardium:  A trivial pericardial effusion was identified.  ------------------------------------------------------------------- Systemic veins: Inferior vena cava: The vessel was normal in size. The respirophasic diameter changes were in the normal range (>= 50%), consistent with normal central venous pressure.  -------------------------------------------------------------------     Assessment and Plan: 1. Persistent  afib Failed tikosyn and is loading on amiodarone She did not tolerate in 2018 for dizziness and fatigue but so far is tolerating, hopefully short term to bridge thru ablation In afib today but rate controlled qtc is 466 ms  Continue xarelto 20 mg a day for CHA2DS2VASc score of 5  Is pending f/u with Dr. Rayann Heman 7/1 afib clinic as needed  Butch Penny C. [facility] [place],  [place] [address] [phone]

## 2018-03-31 NOTE — Telephone Encounter (Signed)
User: Cherie Dark A Date/time: 03/30/18 2:04 PM  Comment: Called pt and lmsg for her to CB to sch [email or web address].Vassie Moment  Context:  Outcome: Left Message  Phone number: [phone] Phone Type: Home Phone  Comm. type: Telephone Call type: Outgoing  Contact: Kenn File J Relation to patient: Self

## 2018-04-03 ENCOUNTER — Telehealth (HOSPITAL_COMMUNITY): Payer: Self-pay | Admitting: *Deleted

## 2018-04-03 ENCOUNTER — Ambulatory Visit (INDEPENDENT_AMBULATORY_CARE_PROVIDER_SITE_OTHER): Payer: Medicare Other | Admitting: Internal Medicine

## 2018-04-03 ENCOUNTER — Encounter: Payer: Self-pay | Admitting: Internal Medicine

## 2018-04-03 VITALS — BP 106/80 | HR 77 | Temp 97.6°F | Wt 240.0 lb

## 2018-04-03 DIAGNOSIS — I10 Essential (primary) hypertension: Secondary | ICD-10-CM

## 2018-04-03 DIAGNOSIS — I4819 Other persistent atrial fibrillation: Secondary | ICD-10-CM

## 2018-04-03 DIAGNOSIS — E1151 Type 2 diabetes mellitus with diabetic peripheral angiopathy without gangrene: Secondary | ICD-10-CM | POA: Diagnosis not present

## 2018-04-03 DIAGNOSIS — I481 Persistent atrial fibrillation: Secondary | ICD-10-CM

## 2018-04-03 DIAGNOSIS — I70209 Unspecified atherosclerosis of native arteries of extremities, unspecified extremity: Secondary | ICD-10-CM

## 2018-04-03 NOTE — Progress Notes (Signed)
Subjective:    Patient ID: Cynthia Herrera, female    DOB: 09-25-1946, 72 y.o.   MRN: [id number]  HPI Admit date: 03/21/2018 Discharge date: 03/26/2018   72 y.o.femalewith a history of non-obstructive CAD, NICM that had improved though down again by last echo (? tachycardia-mediated), HTN, HLD, chronic CHF (systolic), CBP, COPD, GERD, breast cancer treated, DM, persistent AFib,CHADS2VASc 6 on Xarelto.   [name] persistent AFib, historically failed amiodarone 2/2 dizziness, this was stopped back in 2018, she has had recurrent, symptomatic AFib. She was admitted for Tikosyn initiation on 03/21/2018.  Lab Results  Component Value Date   HGBA1C 6.2 (H) 01/02/2018   Patient seen today following a recent hospital discharge 8 days ago.  She has been seen in follow-up by cardiology.  She is scheduled for a nuclear stress test later this week.  She has type 2 diabetes which has been controlled with metformin therapy only.  She has a history of chronic diastolic heart failure.  No new concerns or complaints.  Amiodarone has been restarted.  She is scheduled for follow-up cardiology visit next month.  Past Medical History:  Diagnosis Date  . Arthritis   . Breast cancer, left breast ([place]) 2002   "then treated w/chemo and radiation"  . Breast cancer, left breast ([place]) 07/14/2017   "tx'd w/mastectomy"  . Chronic back pain    h/o lumbar stenosis; "no problem since my OR" (`10/27/2017)  . Chronic systolic CHF (congestive heart failure) (HCC)    takes Furosemide and Aldactone daily  . Complication of anesthesia    last surgery iv med when going to sleep"burned as injected"  . COPD (chronic obstructive pulmonary disease) ([facility])    no inhalers   . DCM (dilated cardiomyopathy) ([place])    EF 15-20% ? tachycardia induced - EF 50-55% on echo 2015  . Dyspnea    with exertion  . Dysrhythmia    afib  . Gallstones   . GERD (gastroesophageal reflux disease)    once in a while;depends on  what she eats  . History of bronchitis    "chronic when I smoked; no problem since I quit in 2002" (10/27/2017)  . Hyperlipidemia   . Hypertension    takes Losartan and Metoprolol.   . Joint pain   . Muscle spasm    takes Flexeril daily as needed   . Nocturia   . Osteopenia   . Persistent atrial fibrillation ([place])    s/p TEE DCCV and repeat DCCV 11/14/2013  . Personal history of chemotherapy   . Personal history of colonic polyps    adenomas 03 and 08  . Personal history of radiation therapy   . Pneumonia    hx  . Seasonal allergies   . Thyroid nodule   . Type II diabetes mellitus ([place])   . Vitamin D deficiency    takes Vit D     Social History   Socioeconomic History  . Marital status: Widowed    Spouse name: Not on file  . Number of children: 2  . Years of education: Not on file  . Highest education level: Not on file  Occupational History  . Occupation: retired  Scientific laboratory technician  . Financial resource strain: Not on file  . Food insecurity:    Worry: Not on file    Inability: Not on file  . Transportation needs:    Medical: Not on file    Non-medical: Not on file  Tobacco Use  . Smoking status: Former  Smoker    Packs/day: 1.50    Years: 35.00    Pack years: 52.50    Types: Cigarettes    Last attempt to quit: 08/24/2001    Years since quitting: 16.6  . Smokeless tobacco: Never Used  Substance and Sexual Activity  . Alcohol use: No  . Drug use: No  . Sexual activity: Never    Birth control/protection: Post-menopausal  Lifestyle  . Physical activity:    Days per week: Not on file    Minutes per session: Not on file  . Stress: Not on file  Relationships  . Social connections:    Talks on phone: Not on file    Gets together: Not on file    Attends religious service: Not on file    Active member of club or organization: Not on file    Attends meetings of clubs or organizations: Not on file    Relationship status: Not on file  . Intimate partner violence:     Fear of current or ex partner: Not on file    Emotionally abused: Not on file    Physically abused: Not on file    Forced sexual activity: Not on file  Other Topics Concern  . Not on file  Social History Narrative  . Not on file    Past Surgical History:  Procedure Laterality Date  . AXILLARY LYMPH NODE DISSECTION Left 10/2001   persistent intramammary node Archie Endo 03/09/2011  . BACK SURGERY    . BREAST BIOPSY Left 2002  . BREAST BIOPSY Right 06/2017   "node bx was negative"  . BREAST BIOPSY Left 06/2017   "positive for cancer"  . BREAST LUMPECTOMY WITH RADIOACTIVE SEED LOCALIZATION Right 08/15/2017   Procedure: RIGHT BREAST LUMPECTOMY WITH RADIOACTIVE SEED LOCALIZATION;  Surgeon: Jovita Kussmaul, MD;  Location: [facility];  Service: General;  Laterality: Right;  . CARDIAC CATHETERIZATION  2015  . CARDIOVERSION N/A 11/12/2013   Procedure: CARDIOVERSION;  Surgeon: Thayer Headings, MD;  Location: [facility] ENDOSCOPY;  Service: Cardiovascular;  Laterality: N/A;  10:08  Dr. Marissa Nestle, anesthesia present, [place]   60mg ,  propofol 50mg , IV for elective cardioversion....Dr. Cathie Olden delievered synch 120 joules with successful cardioversion to NSR  . CARDIOVERSION N/A 11/12/2013   Procedure: CARDIOVERSION;  Surgeon: Thayer Headings, MD;  Location: [facility];  Service: Cardiovascular;  Laterality: N/A;  . CARDIOVERSION N/A 11/14/2013   Procedure: CARDIOVERSION (BEDSIDE);  Surgeon: Larey Dresser, MD;  Location: [facility];  Service: Cardiovascular;  Laterality: N/A;  . CARDIOVERSION N/A 02/22/2018   Procedure: CARDIOVERSION;  Surgeon: Sueanne Margarita, MD;  Location: [facility] ENDOSCOPY;  Service: Cardiovascular;  Laterality: N/A;  . CATARACT EXTRACTION W/ INTRAOCULAR LENS  IMPLANT, BILATERAL Bilateral ~ 2016  . COLONOSCOPY  2003, September 2008, April 01, 2011   adenomas 03 and 08, polyp 12, diverticulosis  . COLONOSCOPY WITH PROPOFOL N/A 11/16/2016   Procedure: COLONOSCOPY WITH PROPOFOL;  Surgeon: Gatha Mayer, MD;   Location: [facility] ENDOSCOPY;  Service: Endoscopy;  Laterality: N/A;  . JOINT REPLACEMENT    . LAPAROSCOPIC CHOLECYSTECTOMY  2004  . LEFT AND RIGHT HEART CATHETERIZATION WITH CORONARY ANGIOGRAM N/A 02/01/2014   Procedure: LEFT AND RIGHT HEART CATHETERIZATION WITH CORONARY ANGIOGRAM;  Surgeon: Sueanne Margarita, MD;  Location: [facility] CATH LAB;  Service: Cardiovascular;  Laterality: N/A;  . LUMBAR LAMINECTOMY/DECOMPRESSION MICRODISCECTOMY N/A 03/19/2014   Procedure: LUMBAR LAMINECTOMY/DECOMPRESSION MICRODISCECTOMY 4 LEVEL;  Surgeon: Kristeen Miss, MD;  Location: [facility] NEURO ORS;  Service: Neurosurgery;  Laterality: N/A;  L1-2 L2-3 L3-4 L4-5 Laminectomy/Foraminotomy  . MASS EXCISION Left 08/15/2017   Procedure: EXCISION OF LEFT BREAST MASS;  Surgeon: Jovita Kussmaul, MD;  Location: [facility];  Service: General;  Laterality: Left;  Marland Kitchen MASTECTOMY Left 10/27/2017  . MASTECTOMY PARTIAL / LUMPECTOMY W/ AXILLARY LYMPHADENECTOMY  05/08/2001   Archie Endo 03/09/2011  . PORT-A-CATH REMOVAL  2003  . PORTA CATH INSERTION  2002  . TEE WITHOUT CARDIOVERSION N/A 11/12/2013   Procedure: TRANSESOPHAGEAL ECHOCARDIOGRAM (TEE);  Surgeon: Thayer Headings, MD;  Location: [facility];  Service: Cardiovascular;  Laterality: N/A;  pt b/p low, pt buccal membranes very dry, lips scapped, pt c/o thirst. NPO since MN and iv FLUIDS TOTAL INFUSING AT TOTAL 20ML HR....  Dr. Cathie Olden order allow NS to bolus during procedure....very dry,NS bolus 250 ml total..pt responding well to meds..  . TOTAL KNEE ARTHROPLASTY Left 2006  . TOTAL KNEE ARTHROPLASTY Right 05/10/2016   Procedure: RIGHT TOTAL KNEE ARTHROPLASTY;  Surgeon: Gaynelle Arabian, MD;  Location: [facility] ORS;  Service: Orthopedics;  Laterality: Right;  With adductor block  . TOTAL MASTECTOMY Left 10/27/2017   Procedure: LEFT MASTECTOMY;  Surgeon: Jovita Kussmaul, MD;  Location: [facility];  Service: General;  Laterality: Left;  . TUBAL LIGATION      Family History  Problem Relation Age of Onset  . Dementia Sister   .  Diabetes Sister   . Alzheimer's disease Sister   . Diabetes Brother        x 2  . Heart disease Brother   . Irritable bowel syndrome Brother   . Liver cancer Mother   . Diabetes Mother   . Pancreatic cancer Mother   . Diabetes Father   . Heart disease Father     Allergies  Allergen Reactions  . Tikosyn [Dofetilide]     Prolonged QT/QTc  . Ambien [Zolpidem] Other (See Comments)    Hallucinations, up walking around  . Codeine Phosphate Nausea And Vomiting  . Medrol [Methylprednisolone] Other (See Comments)    Felt really weird w the high dose oral steroid, but tolerates low doses or oral steroid    Current Outpatient Medications on File Prior to Visit  Medication Sig Dispense Refill  . amiodarone (PACERONE) 200 MG tablet Take 1 tablet (200 mg total) by mouth daily. 2 tabs bid x 1 week, 1 tab bid x 1 week, then 1 tab daily 60 tablet 11  . amoxicillin (AMOXIL) 500 MG capsule Take 2,000 mg by mouth See admin instructions. Take 2000 mg by mouth 1 hour prior to dental appointment  5  . atorvastatin (LIPITOR) 40 MG tablet TAKE 1 TABLET (40 MG TOTAL) BY MOUTH AT BEDTIME. 90 tablet 3  . BYSTOLIC 2.5 MG tablet Take 2.5 mg by mouth daily.  6  . Cholecalciferol (VITAMIN D3) 2000 units capsule Take 2,000 Units by mouth daily.    . clotrimazole-betamethasone (LOTRISONE) cream Apply 1 application topically 2 (two) times daily. 30 g 0  . fexofenadine (ALLEGRA) 180 MG tablet Take 180 mg by mouth daily as needed for allergies or rhinitis.    . fluticasone (FLONASE) 50 MCG/ACT nasal spray Place 1 spray into both nostrils daily as needed for allergies.     . furosemide (LASIX) 20 MG tablet Take 1 tablet (20 mg total) by mouth 2 (two) times daily. 180 tablet 3  . glucose blood test strip Use to check CBG AC and QHS. 200 each 12  . KLOR-CON M20 20 MEQ tablet Take 1 tablet (20 mEq total)  by mouth daily. 90 tablet 3  . letrozole (FEMARA) 2.5 MG tablet Take 1 tablet (2.5 mg total) by mouth daily. 90  tablet 3  . losartan (COZAAR) 25 MG tablet TAKE 1 TABLET BY MOUTH TWICE A DAY 180 tablet 3  . metFORMIN (GLUCOPHAGE) 500 MG tablet TAKE 1 TABLET TWICE A DAY WITH A MEAL 180 tablet 1  . nebivolol (BYSTOLIC) 5 MG tablet Take 1 tablet (5 mg total) by mouth daily. 90 tablet 3  . spironolactone (ALDACTONE) 25 MG tablet Take 1 tablet (25 mg total) by mouth daily. 90 tablet 3  . traMADol (ULTRAM) 50 MG tablet Take 1-2 tablets (50-100 mg total) by mouth every 6 (six) hours as needed. (Patient taking differently: Take 50-100 mg by mouth every 6 (six) hours as needed for moderate pain. ) 20 tablet 0  . XARELTO 20 MG TABS tablet TAKE 1 TABLET EVERY DAY WITH SUPPER 30 tablet 6   No current facility-administered medications on file prior to visit.     BP 106/80 (BP Location: Right Arm, Patient Position: Sitting, Cuff Size: Normal)   Pulse 77   Temp 97.6 F (36.4 C) (Oral)   Wt 240 lb (108.9 kg)   SpO2 99%   BMI 38.74 kg/m      Review of Systems  Constitutional: Negative.   HENT: Negative for congestion, dental problem, hearing loss, rhinorrhea, sinus pressure, sore throat and tinnitus.   Eyes: Negative for pain, discharge and visual disturbance.  Respiratory: Negative for cough and shortness of breath.   Cardiovascular: Negative for chest pain, palpitations and leg swelling.  Gastrointestinal: Negative for abdominal distention, abdominal pain, blood in stool, constipation, diarrhea, nausea and vomiting.  Genitourinary: Negative for difficulty urinating, dysuria, flank pain, frequency, hematuria, pelvic pain, urgency, vaginal bleeding, vaginal discharge and vaginal pain.  Musculoskeletal: Negative for arthralgias, gait problem and joint swelling.  Skin: Negative for rash.  Neurological: Negative for dizziness, syncope, speech difficulty, weakness, numbness and headaches.  Hematological: Negative for adenopathy.  Psychiatric/Behavioral: Negative for agitation, behavioral problems and dysphoric  mood. The patient is not nervous/anxious.        Objective:   Physical Exam  Constitutional: She is oriented to person, place, and time. She appears well-developed and well-nourished.  HENT:  Head: Normocephalic.  Right Ear: External ear normal.  Left Ear: External ear normal.  Mouth/Throat: Oropharynx is clear and moist.  Eyes: Pupils are equal, round, and reactive to light. Conjunctivae and EOM are normal.  Neck: Normal range of motion. Neck supple. No thyromegaly present.  Cardiovascular: Normal rate, normal heart sounds and intact distal pulses.  Irregular rhythm with controlled ventricular response  Pulmonary/Chest: Effort normal and breath sounds normal.  Abdominal: Soft. Bowel sounds are normal. She exhibits no mass. There is no tenderness.  Musculoskeletal: Normal range of motion.  Lymphadenopathy:    She has no cervical adenopathy.  Neurological: She is alert and oriented to person, place, and time.  Skin: Skin is warm and dry. No rash noted.  Psychiatric: She has a normal mood and affect. Her behavior is normal.          Assessment & Plan:  Persistent atrial fibrillation.  Cardiology follow-up as scheduled.  Continue chronic anticoagulation  Coronary artery disease.  Patient is scheduled for nuclear stress test later this week  Diabetes mellitus.  Continue metformin therapy  Chronic combined systolic and diastolic heart failure stable  Follow-up 3 months Cardiology follow-up as scheduled  Marletta Lor

## 2018-04-03 NOTE — Telephone Encounter (Signed)
Patient given detailed instructions per Myocardial Perfusion Study Information Sheet for the test on 04/06/18 at 1230. Patient notified to arrive 15 minutes early and that it is imperative to arrive on time for appointment to keep from having the test rescheduled.  If you need to cancel or reschedule your appointment, please call the office within 24 hours of your appointment. . Patient verbalized understanding.Aasir Daigler, Ranae Palms

## 2018-04-03 NOTE — Patient Instructions (Signed)
Limit your sodium (Salt) intake   Please check your hemoglobin A1c every 3-6  Months    It is important that you exercise regularly, at least 20 minutes 3 to 4 times per week.  If you develop chest pain or shortness of breath seek  medical attention.  Return in 3 months for follow-up

## 2018-04-05 ENCOUNTER — Telehealth (HOSPITAL_COMMUNITY): Payer: Self-pay

## 2018-04-05 ENCOUNTER — Telehealth: Payer: Self-pay

## 2018-04-05 NOTE — Telephone Encounter (Signed)
Spoke to Ms. Kingsbury and she will be coming on 04/13/18 to [place] to enroll for the AFIB PREP group that will be at the [facility].

## 2018-04-05 NOTE — Telephone Encounter (Signed)
Detailed instructions given and the patient stated that she understood and will be here tomorrow for her Lexiscan Myoview test. S.Sonji Starkes EMTP

## 2018-04-06 ENCOUNTER — Ambulatory Visit (HOSPITAL_COMMUNITY): Payer: Medicare Other | Attending: Cardiology

## 2018-04-06 DIAGNOSIS — I420 Dilated cardiomyopathy: Secondary | ICD-10-CM

## 2018-04-06 DIAGNOSIS — I4891 Unspecified atrial fibrillation: Secondary | ICD-10-CM

## 2018-04-06 MED ORDER — REGADENOSON 0.4 MG/5ML IV SOLN
0.4000 mg | Freq: Once | INTRAVENOUS | Status: AC
  Administered 2018-04-06: 0.4 mg via INTRAVENOUS

## 2018-04-06 MED ORDER — TECHNETIUM TC 99M TETROFOSMIN IV KIT
32.0000 | PACK | Freq: Once | INTRAVENOUS | Status: AC | PRN
  Administered 2018-04-06: 32 via INTRAVENOUS
  Filled 2018-04-06: qty 32

## 2018-04-10 ENCOUNTER — Ambulatory Visit (HOSPITAL_COMMUNITY): Payer: Medicare Other | Attending: Cardiology

## 2018-04-10 LAB — MYOCARDIAL PERFUSION IMAGING
CHL CUP NUCLEAR SDS: 1
CSEPPHR: 69 {beats}/min
LHR: 0.4
LV dias vol: 191 mL (ref 46–106)
LV sys vol: 113 mL
NUC STRESS TID: 1.04
Rest HR: 59 {beats}/min
SRS: 8
SSS: 9

## 2018-04-10 MED ORDER — TECHNETIUM TC 99M TETROFOSMIN IV KIT
31.2000 | PACK | Freq: Once | INTRAVENOUS | Status: AC | PRN
  Administered 2018-04-10: 31.2 via INTRAVENOUS
  Filled 2018-04-10: qty 32

## 2018-04-20 ENCOUNTER — Other Ambulatory Visit: Payer: Self-pay | Admitting: Cardiology

## 2018-04-24 ENCOUNTER — Ambulatory Visit: Payer: Medicare Other | Admitting: Internal Medicine

## 2018-04-24 ENCOUNTER — Encounter: Payer: Self-pay | Admitting: Internal Medicine

## 2018-04-24 VITALS — BP 114/62 | HR 69 | Ht 66.0 in | Wt 238.0 lb

## 2018-04-24 DIAGNOSIS — I481 Persistent atrial fibrillation: Secondary | ICD-10-CM | POA: Diagnosis not present

## 2018-04-24 DIAGNOSIS — I420 Dilated cardiomyopathy: Secondary | ICD-10-CM

## 2018-04-24 DIAGNOSIS — I10 Essential (primary) hypertension: Secondary | ICD-10-CM

## 2018-04-24 DIAGNOSIS — R400 Somnolence: Secondary | ICD-10-CM | POA: Diagnosis not present

## 2018-04-24 DIAGNOSIS — I4819 Other persistent atrial fibrillation: Secondary | ICD-10-CM

## 2018-04-24 MED ORDER — AMIODARONE HCL 200 MG PO TABS: 200.0000 mg | ORAL_TABLET | Freq: Every day | ORAL | 3 refills | Status: DC

## 2018-04-24 NOTE — Patient Instructions (Addendum)
Medication Instructions:  Your physician recommends that you continue on your current medications as directed. Please refer to the Current Medication list given to you today.  Labwork: None ordered  Testing/Procedures: Your physician has recommended that you have a sleep study. This test records several body functions during sleep, including: brain activity, eye movement, oxygen and carbon dioxide blood levels, heart rate and rhythm, breathing rate and rhythm, the flow of air through your mouth and nose, snoring, body muscle movements, and chest and belly movement.  Someone will contact you after we have pre certified it with your insurance.  Follow-Up: Your physician recommends that you schedule a follow-up appointment in: 3 months with Roderic Palau, NP in the AFib clinic.   * If you need a refill on your cardiac medications before your next appointment, please call your pharmacy.   *Please note that any paperwork needing to be filled out by the provider will need to be addressed at the front desk prior to seeing the provider. Please note that any FMLA, disability or other documents regarding health condition is subject to a $25.00 charge that must be received prior to completion of paperwork in the form of a money order or check.  Thank you for choosing [facility]!!

## 2018-04-24 NOTE — Progress Notes (Signed)
PCP: Marletta Lor, MD Primary Cardiologist: Dr Radford Pax Primary EP: Dr Rayann Heman  Cynthia Herrera is a 72 y.o. female who presents today for routine electrophysiology followup.  Since last being seen in our clinic, the patient reports doing very well.  Continues to have some afib.  + fatigue.  + poor exercise tolerance.  Does not feel well rested upon waking.  Today, she denies symptoms of palpitations, chest pain, shortness of breath,  lower extremity edema, dizziness, presyncope, or syncope.  The patient is otherwise without complaint today.   Past Medical History:  Diagnosis Date  . Arthritis   . Breast cancer, left breast ([place]) 2002   "then treated w/chemo and radiation"  . Breast cancer, left breast ([place]) 07/14/2017   "tx'd w/mastectomy"  . Chronic back pain    h/o lumbar stenosis; "no problem since my OR" (`10/27/2017)  . Chronic systolic CHF (congestive heart failure) (HCC)    takes Furosemide and Aldactone daily  . Complication of anesthesia    last surgery iv med when going to sleep"burned as injected"  . COPD (chronic obstructive pulmonary disease) (HCC)    no inhalers   . DCM (dilated cardiomyopathy) ([place])    EF 15-20% ? tachycardia induced - EF 50-55% on echo 2015  . Dyspnea    with exertion  . Dysrhythmia    afib  . Gallstones   . GERD (gastroesophageal reflux disease)    once in a while;depends on what she eats  . History of bronchitis    "chronic when I smoked; no problem since I quit in 2002" (10/27/2017)  . Hyperlipidemia   . Hypertension    takes Losartan and Metoprolol.   . Joint pain   . Muscle spasm    takes Flexeril daily as needed   . Nocturia   . Osteopenia   . Persistent atrial fibrillation ([place])    s/p TEE DCCV and repeat DCCV 11/14/2013  . Personal history of chemotherapy   . Personal history of colonic polyps    adenomas 03 and 08  . Personal history of radiation therapy   . Pneumonia    hx  . Seasonal allergies   . Thyroid nodule   .  Type II diabetes mellitus ([place])   . Vitamin D deficiency    takes Vit D   Past Surgical History:  Procedure Laterality Date  . AXILLARY LYMPH NODE DISSECTION Left 10/2001   persistent intramammary node Archie Endo 03/09/2011  . BACK SURGERY    . BREAST BIOPSY Left 2002  . BREAST BIOPSY Right 06/2017   "node bx was negative"  . BREAST BIOPSY Left 06/2017   "positive for cancer"  . BREAST LUMPECTOMY WITH RADIOACTIVE SEED LOCALIZATION Right 08/15/2017   Procedure: RIGHT BREAST LUMPECTOMY WITH RADIOACTIVE SEED LOCALIZATION;  Surgeon: Jovita Kussmaul, MD;  Location: [place];  Service: General;  Laterality: Right;  . CARDIAC CATHETERIZATION  2015  . CARDIOVERSION N/A 11/12/2013   Procedure: CARDIOVERSION;  Surgeon: Thayer Headings, MD;  Location: [facility] ENDOSCOPY;  Service: Cardiovascular;  Laterality: N/A;  10:08  Dr. Marissa Nestle, anesthesia present, Lido   60mg ,  propofol 50mg , IV for elective cardioversion....Dr. Cathie Olden delievered synch 120 joules with successful cardioversion to NSR  . CARDIOVERSION N/A 11/12/2013   Procedure: CARDIOVERSION;  Surgeon: Thayer Headings, MD;  Location: [place];  Service: Cardiovascular;  Laterality: N/A;  . CARDIOVERSION N/A 11/14/2013   Procedure: CARDIOVERSION (BEDSIDE);  Surgeon: Larey Dresser, MD;  Location: [place];  Service: Cardiovascular;  Laterality: N/A;  . CARDIOVERSION N/A 02/22/2018   Procedure: CARDIOVERSION;  Surgeon: Sueanne Margarita, MD;  Location: [place] ENDOSCOPY;  Service: Cardiovascular;  Laterality: N/A;  . CATARACT EXTRACTION W/ INTRAOCULAR LENS  IMPLANT, BILATERAL Bilateral ~ 2016  . COLONOSCOPY  2003, September 2008, April 01, 2011   adenomas 03 and 08, polyp 12, diverticulosis  . COLONOSCOPY WITH PROPOFOL N/A 11/16/2016   Procedure: COLONOSCOPY WITH PROPOFOL;  Surgeon: Gatha Mayer, MD;  [facility]: [facility] ENDOSCOPY;  Service: Endoscopy;  Laterality: N/A;  . JOINT REPLACEMENT    . LAPAROSCOPIC CHOLECYSTECTOMY  2004  . LEFT AND RIGHT HEART CATHETERIZATION  WITH CORONARY ANGIOGRAM N/A 02/01/2014   Procedure: LEFT AND RIGHT HEART CATHETERIZATION WITH CORONARY ANGIOGRAM;  Surgeon: Sueanne Margarita, MD;  Location: [place] CATH LAB;  Service: Cardiovascular;  Laterality: N/A;  . LUMBAR LAMINECTOMY/DECOMPRESSION MICRODISCECTOMY N/A 03/19/2014   Procedure: LUMBAR LAMINECTOMY/DECOMPRESSION MICRODISCECTOMY 4 LEVEL;  Surgeon: Kristeen Miss, MD;  Location: [place] NEURO ORS;  Service: Neurosurgery;  Laterality: N/A;  L1-2 L2-3 L3-4 L4-5 Laminectomy/Foraminotomy  . MASS EXCISION Left 08/15/2017   Procedure: EXCISION OF LEFT BREAST MASS;  Surgeon: Jovita Kussmaul, MD;  Location: [place];  Service: General;  Laterality: Left;  Marland Kitchen MASTECTOMY Left 10/27/2017  . MASTECTOMY PARTIAL / LUMPECTOMY W/ AXILLARY LYMPHADENECTOMY  05/08/2001   Archie Endo 03/09/2011  . PORT-A-CATH REMOVAL  2003  . PORTA CATH INSERTION  2002  . TEE WITHOUT CARDIOVERSION N/A 11/12/2013   Procedure: TRANSESOPHAGEAL ECHOCARDIOGRAM (TEE);  Surgeon: Thayer Headings, MD;  Location: [place];  Service: Cardiovascular;  Laterality: N/A;  pt b/p low, pt buccal membranes very dry, lips scapped, pt c/o thirst. NPO since MN and iv FLUIDS TOTAL INFUSING AT TOTAL 20ML HR....  Dr. Cathie Olden order allow NS to bolus during procedure....very dry,NS bolus 250 ml total..pt responding well to meds..  . TOTAL KNEE ARTHROPLASTY Left 2006  . TOTAL KNEE ARTHROPLASTY Right 05/10/2016   Procedure: RIGHT TOTAL KNEE ARTHROPLASTY;  Surgeon: Gaynelle Arabian, MD;  [facility] ORS;  Service: Orthopedics;  Laterality: Right;  With adductor block  . TOTAL MASTECTOMY Left 10/27/2017   Procedure: LEFT MASTECTOMY;  Surgeon: Jovita Kussmaul, MD;  Location: [place];  Service: General;  Laterality: Left;  . TUBAL LIGATION      ROS- all systems are reviewed and negatives except as per HPI above  Current Outpatient Medications  Medication Sig Dispense Refill  . amiodarone (PACERONE) 200 MG tablet Take 1 tablet (200 mg total) by mouth daily. 2 tabs bid x 1  week, 1 tab bid x 1 week, then 1 tab daily 60 tablet 11  . amoxicillin (AMOXIL) 500 MG capsule Take 2,000 mg by mouth See admin instructions. Take 2000 mg by mouth 1 hour prior to dental appointment  5  . atorvastatin (LIPITOR) 40 MG tablet TAKE 1 TABLET BY MOUTH EVERYDAY AT BEDTIME 90 tablet 1  . BYSTOLIC 2.5 MG tablet Take 5 mg by mouth daily.   6  . Cholecalciferol (VITAMIN D3) 2000 units capsule Take 2,000 Units by mouth daily.    . clotrimazole-betamethasone (LOTRISONE) cream Apply 1 application topically 2 (two) times daily. 30 g 0  . fexofenadine (ALLEGRA) 180 MG tablet Take 180 mg by mouth daily as needed for allergies or rhinitis.    . fluticasone (FLONASE) 50 MCG/ACT nasal spray Place 1 spray into both nostrils daily as needed for allergies.     . furosemide (LASIX) 20 MG tablet Take 1 tablet (20 mg total) by mouth 2 (  two) times daily. 180 tablet 3  . glucose blood test strip Use to check CBG AC and QHS. 200 each 12  . KLOR-CON M20 20 MEQ tablet Take 1 tablet (20 mEq total) by mouth daily. 90 tablet 3  . letrozole (FEMARA) 2.5 MG tablet Take 1 tablet (2.5 mg total) by mouth daily. 90 tablet 3  . losartan (COZAAR) 25 MG tablet TAKE 1 TABLET BY MOUTH TWICE A DAY 180 tablet 3  . metFORMIN (GLUCOPHAGE) 500 MG tablet TAKE 1 TABLET TWICE A DAY WITH A MEAL 180 tablet 1  . spironolactone (ALDACTONE) 25 MG tablet Take 1 tablet (25 mg total) by mouth daily. 90 tablet 3  . traMADol (ULTRAM) 50 MG tablet Take 1-2 tablets (50-100 mg total) by mouth every 6 (six) hours as needed. (Patient taking differently: Take 50-100 mg by mouth every 6 (six) hours as needed for moderate pain. ) 20 tablet 0  . XARELTO 20 MG TABS tablet TAKE 1 TABLET EVERY DAY WITH SUPPER 30 tablet 6  . nebivolol (BYSTOLIC) 5 MG tablet Take 1 tablet (5 mg total) by mouth daily. (Patient not taking: Reported on 04/24/2018) 90 tablet 3   No current facility-administered medications for this visit.     Physical Exam: Vitals:    04/24/18 1328  BP: 114/62  Pulse: 69  SpO2: 92%  Weight: 238 lb (108 kg)  Height: 5\' 6"  (1.676 m)    GEN- The patient is well appearing, alert and oriented x 3 today.   Head- normocephalic, atraumatic Eyes-  Sclera clear, conjunctiva pink Ears- hearing intact Oropharynx- clear Lungs- Clear to ausculation bilaterally, normal work of breathing Heart- Regular rate and rhythm, no murmurs, rubs or gallops, PMI not laterally displaced GI- soft, NT, ND, + BS Extremities- no clubbing, cyanosis, or edema  Wt Readings from Last 3 Encounters:  04/24/18 238 lb (108 kg)  04/06/18 240 lb (108.9 kg)  04/03/18 240 lb (108.9 kg)    EKG tracing ordered today is personally reviewed and shows sinus rhythm 69 bpm, PR 168 msec, Qtc 452 msec  Echo 03/03/18- mild LVH, EF 25%, mild LA enlargement  Assessment and Plan:  1. Persistent afib Doing reasonably well with amiodarone currently She has ongoing issues with fatigue. Failed medical therapy with tikosyn due to qt prolongation. I did offer ablation today, she declines. chads2vasc score is 5.  On xarelto. We discussed lifestyle modification at length  2. HTN Stable No change required today  3. Nonischemic CM Per Estella Husk' note 03/06/18, felt to be partially due to afib (previously normalized with sinus rhythm)   4. Fatigue, does not feel well rested in am Sleep study ordered  5. Obesity Body mass index is 38.41 kg/m. Lifestyle modification encouraged She is enrolled in [facility] AF program  Follow-up in AF clinic in 3 months and with Dr Radford Pax going forward. If she decides to proceed with ablation then I am happy to see her again at that time.  Thompson Grayer MD, [facility] 04/24/2018 1:55 PM

## 2018-04-25 ENCOUNTER — Telehealth: Payer: Self-pay | Admitting: *Deleted

## 2018-04-25 NOTE — Telephone Encounter (Signed)
Staff message sent to [place]. No PA required per [facility] web portal. Ok to schedule.

## 2018-04-25 NOTE — Telephone Encounter (Signed)
-----   Message from Stanton Kidney, RN sent at 04/24/2018  3:54 PM EDT ----- Regarding: Sleep Study Please arrange sleep study Dx: Daytime fatigue, AFib  Pt reports that she does not sleep at night.  She is usually up all night and sleeps in the morning.  She is requesting a morning/daytime sleep study.  Epworth Sleepiness Scale: 1.   2 2.   1 3.   0 4.   0 5.   3 6.   0 7.   0 8.   0 ----------------- Total = 6

## 2018-04-26 ENCOUNTER — Telehealth: Payer: Self-pay | Admitting: *Deleted

## 2018-04-26 NOTE — Telephone Encounter (Signed)
-----   Message from Lauralee Evener, [place] sent at 04/25/2018  4:18 PM EDT ----- Regarding: RE: Sleep Study Ok to schedule no PA REQUIRED.Marland Kitchen ----- Message ----- From: Stanton Kidney, RN Sent: 04/24/2018   3:54 PM To: Stanton Kidney, RN, [facility] Sleep Studies Subject: Sleep Study                                    Please arrange sleep study Dx: Daytime fatigue, AFib  Pt reports that she does not sleep at night.  She is usually up all night and sleeps in the morning.  She is requesting a morning/daytime sleep study.  Epworth Sleepiness Scale: 1.   2 2.   1 3.   0 4.   0 5.   3 6.   0 7.   0 8.   0 ----------------- Total = 6

## 2018-04-26 NOTE — Telephone Encounter (Signed)
Patient is scheduled for lab study on 05/24/18. Patient understands her sleep study will be done at [facility] sleep lab. Patient understands she will receive a sleep packet in a week or so. Patient understands to call if she does not receive the sleep packet in a timely manner. Patient agrees with treatment and thanked me for call.

## 2018-05-01 NOTE — Progress Notes (Signed)
[place] Report   Patient Details  Name: CEDRICA BRUNE MRN: [id number] Date of Birth: August 20, 1946 Age: 72 y.o. PCP: Marletta Lor, MD  Vitals:   04/13/18 1544  BP: 122/78  Pulse: 60  Resp: 18  SpO2: 97%  Weight: 239 lb 9.6 oz (108.7 kg)     [facility] Eval - 05/01/18 1500      Referral    Referring Provider  AFIB clinic    Reason for referral  Obesitity/Overweight;Hypertension;High Cholesterol;Inactivity;Cancer;Orthopedic;Diabetes    Program Start Date  04/13/18      Information for Trainer   Goals  "Build core strength, improve AFIB to whatever degree I can, to walk to [employer] & back w/o losing breath"    Current Exercise  "some walking"    Orthopedic Concerns  "back pain"    Pertinent Medical History  LT mastectomy, stenosis-thoracic surgery 2015, needs lumbar surgery    Current Barriers  "back and breathing"    Medications that affect exercise  Beta blocker      Timed Up and Go (TUGS)   Timed Up and Go  High risk >13 seconds 14.37   14.37     Mobility and Daily Activities   I find it easy to walk up or down two or more flights of stairs.  1    I have no trouble taking out the trash.  4    I do housework such as vacuuming and dusting on my own without difficulty.  1    I can easily lift a gallon of milk (8lbs).  4    I can easily walk a mile.  1    I have no trouble reaching into high cupboards or reaching down to pick up something from the floor.  2    I do not have trouble doing out-door work such as Armed forces logistics/support/administrative officer, raking leaves, or gardening.  1      Mobility and Daily Activities   I feel younger than my age.  4    I feel independent.  4    I feel energetic.  3    I live an active life.   2    I feel strong.  4    I feel healthy.  3    I feel active as other people my age.  2      How fit and strong are you.   Fit and Strong Total Score  36      Past Medical History:  Diagnosis Date  . Arthritis   . Breast  cancer, left breast ([place]) 2002   "then treated w/chemo and radiation"  . Breast cancer, left breast ([place]) 07/14/2017   "tx'd w/mastectomy"  . Chronic back pain    h/o lumbar stenosis; "no problem since my OR" (`10/27/2017)  . Chronic systolic CHF (congestive heart failure) (HCC)    takes Furosemide and Aldactone daily  . Complication of anesthesia    last surgery iv med when going to sleep"burned as injected"  . COPD (chronic obstructive pulmonary disease) (HCC)    no inhalers   . DCM (dilated cardiomyopathy) ([place])    EF 15-20% ? tachycardia induced - EF 50-55% on echo 2015  . Dyspnea    with exertion  . Dysrhythmia    afib  . Gallstones   . GERD (gastroesophageal reflux disease)    once in a while;depends on what she eats  . History of bronchitis    "chronic when  I smoked; no problem since I quit in 2002" (10/27/2017)  . Hyperlipidemia   . Hypertension    takes Losartan and Metoprolol.   . Joint pain   . Muscle spasm    takes Flexeril daily as needed   . Nocturia   . Osteopenia   . Persistent atrial fibrillation ([place])    s/p TEE DCCV and repeat DCCV 11/14/2013  . Personal history of chemotherapy   . Personal history of colonic polyps    adenomas 03 and 08  . Personal history of radiation therapy   . Pneumonia    hx  . Seasonal allergies   . Thyroid nodule   . Type II diabetes mellitus ([place])   . Vitamin D deficiency    takes Vit D   Past Surgical History:  Procedure Laterality Date  . AXILLARY LYMPH NODE DISSECTION Left 10/2001   persistent intramammary node Archie Endo 03/09/2011  . BACK SURGERY    . BREAST BIOPSY Left 2002  . BREAST BIOPSY Right 06/2017   "node bx was negative"  . BREAST BIOPSY Left 06/2017   "positive for cancer"  . BREAST LUMPECTOMY WITH RADIOACTIVE SEED LOCALIZATION Right 08/15/2017   Procedure: RIGHT BREAST LUMPECTOMY WITH RADIOACTIVE SEED LOCALIZATION;  Surgeon: Jovita Kussmaul, MD;  Location: [place];  Service: General;  Laterality: Right;  .  CARDIAC CATHETERIZATION  2015  . CARDIOVERSION N/A 11/12/2013   Procedure: CARDIOVERSION;  Surgeon: Thayer Headings, MD;  Location: [facility] ENDOSCOPY;  Service: Cardiovascular;  Laterality: N/A;  10:08  Dr. Marissa Nestle, anesthesia present, Lido   60mg ,  propofol 50mg , IV for elective cardioversion....Dr. Cathie Olden delievered synch 120 joules with successful cardioversion to NSR  . CARDIOVERSION N/A 11/12/2013   Procedure: CARDIOVERSION;  Surgeon: Thayer Headings, MD;  Location: [place];  Service: Cardiovascular;  Laterality: N/A;  . CARDIOVERSION N/A 11/14/2013   Procedure: CARDIOVERSION (BEDSIDE);  Surgeon: Larey Dresser, MD;  Location: [place];  Service: Cardiovascular;  Laterality: N/A;  . CARDIOVERSION N/A 02/22/2018   Procedure: CARDIOVERSION;  Surgeon: Sueanne Margarita, MD;  Location: [facility] ENDOSCOPY;  Service: Cardiovascular;  Laterality: N/A;  . CATARACT EXTRACTION W/ INTRAOCULAR LENS  IMPLANT, BILATERAL Bilateral ~ 2016  . COLONOSCOPY  2003, September 2008, April 01, 2011   adenomas 03 and 08, polyp 12, diverticulosis  . COLONOSCOPY WITH PROPOFOL N/A 11/16/2016   Procedure: COLONOSCOPY WITH PROPOFOL;  Surgeon: Gatha Mayer, MD;  [facility] ENDOSCOPY;  Service: Endoscopy;  Laterality: N/A;  . JOINT REPLACEMENT    . LAPAROSCOPIC CHOLECYSTECTOMY  2004  . LEFT AND RIGHT HEART CATHETERIZATION WITH CORONARY ANGIOGRAM N/A 02/01/2014   Procedure: LEFT AND RIGHT HEART CATHETERIZATION WITH CORONARY ANGIOGRAM;  Surgeon: Sueanne Margarita, MD;  Location: [place] CATH LAB;  Service: Cardiovascular;  Laterality: N/A;  . LUMBAR LAMINECTOMY/DECOMPRESSION MICRODISCECTOMY N/A 03/19/2014   Procedure: LUMBAR LAMINECTOMY/DECOMPRESSION MICRODISCECTOMY 4 LEVEL;  Surgeon: Kristeen Miss, MD;  Location: [place] NEURO ORS;  Service: Neurosurgery;  Laterality: N/A;  L1-2 L2-3 L3-4 L4-5 Laminectomy/Foraminotomy  . MASS EXCISION Left 08/15/2017   Procedure: EXCISION OF LEFT BREAST MASS;  Surgeon: Jovita Kussmaul, MD;  Location: [place];  Service:  General;  Laterality: Left;  Marland Kitchen MASTECTOMY Left 10/27/2017  . MASTECTOMY PARTIAL / LUMPECTOMY W/ AXILLARY LYMPHADENECTOMY  05/08/2001   Archie Endo 03/09/2011  . PORT-A-CATH REMOVAL  2003  . PORTA CATH INSERTION  2002  . TEE WITHOUT CARDIOVERSION N/A 11/12/2013   Procedure: TRANSESOPHAGEAL ECHOCARDIOGRAM (TEE);  Surgeon: Thayer Headings, MD;  Location: [place];  Service: Cardiovascular;  Laterality: N/A;  pt b/p low, pt buccal membranes very dry, lips scapped, pt c/o thirst. NPO since MN and iv FLUIDS TOTAL INFUSING AT TOTAL 20ML HR....  Dr. Cathie Olden order allow NS to bolus during procedure....very dry,NS bolus 250 ml total..pt responding well to meds..  . TOTAL KNEE ARTHROPLASTY Left 2006  . TOTAL KNEE ARTHROPLASTY Right 05/10/2016   Procedure: RIGHT TOTAL KNEE ARTHROPLASTY;  Surgeon: Gaynelle Arabian, MD;  [facility];  Service: Orthopedics;  Laterality: Right;  With adductor block  . TOTAL MASTECTOMY Left 10/27/2017   Procedure: LEFT MASTECTOMY;  Surgeon: Jovita Kussmaul, MD;  Location: [place];  Service: General;  Laterality: Left;  . TUBAL LIGATION     Social History   Tobacco Use  Smoking Status Former Smoker  . Packs/day: 1.50  . Years: 35.00  . Pack years: 52.50  . Types: Cigarettes  . Last attempt to quit: 08/24/2001  . Years since quitting: 16.6  Smokeless Tobacco Never Used     Ms. Mcanelly is set to begin the "AFIB" PREP group at the [facility] on Tues/Thurs from 11:30-12:30 for 12 weeks starting on 05/09/18.   Vanita Ingles 05/01/2018, 3:50 PM

## 2018-05-22 ENCOUNTER — Ambulatory Visit (INDEPENDENT_AMBULATORY_CARE_PROVIDER_SITE_OTHER): Payer: Medicare Other | Admitting: Internal Medicine

## 2018-05-22 ENCOUNTER — Encounter: Payer: Self-pay | Admitting: Internal Medicine

## 2018-05-22 ENCOUNTER — Ambulatory Visit: Payer: Medicare Other

## 2018-05-22 VITALS — BP 100/60 | HR 60 | Ht 66.0 in | Wt 237.0 lb

## 2018-05-22 VITALS — BP 104/60 | HR 60

## 2018-05-22 DIAGNOSIS — I259 Chronic ischemic heart disease, unspecified: Secondary | ICD-10-CM | POA: Diagnosis not present

## 2018-05-22 DIAGNOSIS — Z0000 Encounter for general adult medical examination without abnormal findings: Secondary | ICD-10-CM

## 2018-05-22 DIAGNOSIS — I2510 Atherosclerotic heart disease of native coronary artery without angina pectoris: Secondary | ICD-10-CM

## 2018-05-22 DIAGNOSIS — I481 Persistent atrial fibrillation: Secondary | ICD-10-CM | POA: Diagnosis not present

## 2018-05-22 DIAGNOSIS — I4819 Other persistent atrial fibrillation: Secondary | ICD-10-CM

## 2018-05-22 LAB — TROPONIN I: TNIDX: 0 ug/l (ref 0.00–0.06)

## 2018-05-22 MED ORDER — NEBIVOLOL HCL 5 MG PO TABS
2.5000 mg | ORAL_TABLET | Freq: Every day | ORAL | 3 refills | Status: DC
Start: 2018-05-22 — End: 2018-05-23

## 2018-05-22 NOTE — Progress Notes (Signed)
Subjective:    Patient ID: Cynthia Herrera, female    DOB: [birth year], 72 y.o.   MRN: [id number]  HPI  72 year old patient who is seen today as a work in. She was scheduled for a subsequent Medicare wellness visit and was noted to have an episode of fullness in the chest associated with weakness shortness of breath and some lightheadedness.  The symptom complex lasted about 20 minutes.  The patient is followed close by cardiology with a history of Combined systolic and diastolic heart failure as well as paroxysmal atrial fibrillation. She was hospitalized last month and did have a myocardial perfusion imaging study performed approximately 5 weeks ago.  This revealed a slightly improved ejection fraction and no evidence of ischemia. Her symptoms began at approximately 130 today before her arrival to our office.  On arrival her blood pressure was 86/50 with a pulse rate of 44.  This improved to a maximum blood pressure of 100/60 with a pulse rate of 58 She states that she has had many similar episodes since her diagnosis of paroxysmal atrial fibrillation but this symptom complex was a bit more severe and longer acting.  Presently she feels well and is pain-free  EKGs were reviewed.  Today's EKG revealed a sinus bradycardia at the rate of 57 there were some inverted T waves in aVL as well as the anteroseptal leads.  Prior EKGs have revealed similar ST-T wave changes  Bystolic Has recently increased from 2.5 mg to 5 mg daily    Past Medical History:  Diagnosis Date  . Arthritis   . Breast cancer, left breast ([place]) 2002   "then treated w/chemo and radiation"  . Breast cancer, left breast ([place]) 07/14/2017   "tx'd w/mastectomy"  . Chronic back pain    h/o lumbar stenosis; "no problem since my OR" (`10/27/2017)  . Chronic systolic CHF (congestive heart failure) (HCC)    takes Furosemide and Aldactone daily  . Complication of anesthesia    last surgery iv med when going to sleep"burned as  injected"  . COPD (chronic obstructive pulmonary disease) (H[facility])    no inhalers   . DCM (dilated cardiomyopathy) ([place])    EF 15-20% ? tachycardia induced - EF 50-55% on echo 2015  . Dyspnea    with exertion  . Dysrhythmia    afib  . Gallstones   . GERD (gastroesophageal reflux disease)    once in a while;depends on what she eats  . History of bronchitis    "chronic when I smoked; no problem since I quit in 2002" (10/27/2017)  . Hyperlipidemia   . Hypertension    takes Losartan and Metoprolol.   . Joint pain   . Muscle spasm    takes Flexeril daily as needed   . Nocturia   . Osteopenia   . Persistent atrial fibrillation ([place])    s/p TEE DCCV and repeat DCCV 11/14/2013  . Personal history of chemotherapy   . Personal history of colonic polyps    adenomas 03 and 08  . Personal history of radiation therapy   . Pneumonia    hx  . Seasonal allergies   . Thyroid nodule   . Type II diabetes mellitus ([place])   . Vitamin D deficiency    takes Vit D     Social History   Socioeconomic History  . Marital status: Widowed    Spouse name: Not on file  . Number of children: 2  . Years of education: Not on file  .  Highest education level: Not on file  Occupational History  . Occupation: retired  Scientific laboratory technician  . Financial resource strain: Not on file  . Food insecurity:    Worry: Not on file    Inability: Not on file  . Transportation needs:    Medical: Not on file    Non-medical: Not on file  Tobacco Use  . Smoking status: Former Smoker    Packs/day: 1.50    Years: 35.00    Pack years: 52.50    Types: Cigarettes    Last attempt to quit: 08/24/2001    Years since quitting: 16.7  . Smokeless tobacco: Never Used  Substance and Sexual Activity  . Alcohol use: No  . Drug use: No  . Sexual activity: Never    Birth control/protection: Post-menopausal  Lifestyle  . Physical activity:    Days per week: Not on file    Minutes per session: Not on file  . Stress: Not on file    Relationships  . Social connections:    Talks on phone: Not on file    Gets together: Not on file    Attends religious service: Not on file    Active member of club or organization: Not on file    Attends meetings of clubs or organizations: Not on file    Relationship status: Not on file  . Intimate partner violence:    Fear of current or ex partner: Not on file    Emotionally abused: Not on file    Physically abused: Not on file    Forced sexual activity: Not on file  Other Topics Concern  . Not on file  Social History Narrative  . Not on file    Past Surgical History:  Procedure Laterality Date  . AXILLARY LYMPH NODE DISSECTION Left 10/2001   persistent intramammary node Archie Endo 03/09/2011  . BACK SURGERY    . BREAST BIOPSY Left 2002  . BREAST BIOPSY Right 06/2017   "node bx was negative"  . BREAST BIOPSY Left 06/2017   "positive for cancer"  . BREAST LUMPECTOMY WITH RADIOACTIVE SEED LOCALIZATION Right 08/15/2017   Procedure: RIGHT BREAST LUMPECTOMY WITH RADIOACTIVE SEED LOCALIZATION;  Surgeon: Jovita Kussmaul, MD;  Location: [facility];  Service: General;  Laterality: Right;  . CARDIAC CATHETERIZATION  2015  . CARDIOVERSION N/A 11/12/2013   Procedure: CARDIOVERSION;  Surgeon: Thayer Headings, MD;  Location: [facility] ENDOSCOPY;  Service: Cardiovascular;  Laterality: N/A;  10:08  Dr. Marissa Nestle, anesthesia present, [place]   60mg ,  propofol 50mg , IV for elective cardioversion....Dr. Cathie Olden delievered synch 120 joules with successful cardioversion to NSR  . CARDIOVERSION N/A 11/12/2013   Procedure: CARDIOVERSION;  Surgeon: Thayer Headings, MD;  Location: [facility];  Service: Cardiovascular;  Laterality: N/A;  . CARDIOVERSION N/A 11/14/2013   Procedure: CARDIOVERSION (BEDSIDE);  Surgeon: Larey Dresser, MD;  Location: [facility];  Service: Cardiovascular;  Laterality: N/A;  . CARDIOVERSION N/A 02/22/2018   Procedure: CARDIOVERSION;  Surgeon: Sueanne Margarita, MD;  Location: [facility] ENDOSCOPY;  Service:  Cardiovascular;  Laterality: N/A;  . CATARACT EXTRACTION W/ INTRAOCULAR LENS  IMPLANT, BILATERAL Bilateral ~ 2016  . COLONOSCOPY  2003, September 2008, April 01, 2011   adenomas 03 and 08, polyp 12, diverticulosis  . COLONOSCOPY WITH PROPOFOL N/A 11/16/2016   Procedure: COLONOSCOPY WITH PROPOFOL;  Surgeon: Gatha Mayer, MD;  Location: [facility] ENDOSCOPY;  Service: Endoscopy;  Laterality: N/A;  . JOINT REPLACEMENT    . LAPAROSCOPIC CHOLECYSTECTOMY  2004  .  LEFT AND RIGHT HEART CATHETERIZATION WITH CORONARY ANGIOGRAM N/A 02/01/2014   Procedure: LEFT AND RIGHT HEART CATHETERIZATION WITH CORONARY ANGIOGRAM;  Surgeon: Sueanne Margarita, MD;  Location: [facility] CATH LAB;  Service: Cardiovascular;  Laterality: N/A;  . LUMBAR LAMINECTOMY/DECOMPRESSION MICRODISCECTOMY N/A 03/19/2014   Procedure: LUMBAR LAMINECTOMY/DECOMPRESSION MICRODISCECTOMY 4 LEVEL;  Surgeon: Kristeen Miss, MD;  Location: [place] [facility] NEURO ORS;  Service: Neurosurgery;  Laterality: N/A;  L1-2 L2-3 L3-4 L4-5 Laminectomy/Foraminotomy  . MASS EXCISION Left 08/15/2017   Procedure: EXCISION OF LEFT BREAST MASS;  Surgeon: Jovita Kussmaul, MD;  Location: [facility];  Service: General;  Laterality: Left;  Marland Kitchen MASTECTOMY Left 10/27/2017  . MASTECTOMY PARTIAL / LUMPECTOMY W/ AXILLARY LYMPHADENECTOMY  05/08/2001   Archie Endo 03/09/2011  . PORT-A-CATH REMOVAL  2003  . PORTA CATH INSERTION  2002  . TEE WITHOUT CARDIOVERSION N/A 11/12/2013   Procedure: TRANSESOPHAGEAL ECHOCARDIOGRAM (TEE);  Surgeon: Thayer Headings, MD;  Location: [facility];  Service: Cardiovascular;  Laterality: N/A;  pt b/p low, pt buccal membranes very dry, lips scapped, pt c/o thirst. NPO since MN and iv FLUIDS TOTAL INFUSING AT TOTAL 20ML HR....  Dr. Cathie Olden order allow NS to bolus during procedure....very dry,NS bolus 250 ml total..pt responding well to meds..  . TOTAL KNEE ARTHROPLASTY Left 2006  . TOTAL KNEE ARTHROPLASTY Right 05/10/2016   Procedure: RIGHT TOTAL KNEE ARTHROPLASTY;  Surgeon: Gaynelle Arabian, MD;   Location: [facility] ORS;  Service: Orthopedics;  Laterality: Right;  With adductor block  . TOTAL MASTECTOMY Left 10/27/2017   Procedure: LEFT MASTECTOMY;  Surgeon: Jovita Kussmaul, MD;  Location: [place];  Service: General;  Laterality: Left;  . TUBAL LIGATION      Family History  Problem Relation Age of Onset  . Dementia Sister   . Diabetes Sister   . Alzheimer's disease Sister   . Diabetes Brother        x 2  . Heart disease Brother   . Irritable bowel syndrome Brother   . Liver cancer Mother   . Diabetes Mother   . Pancreatic cancer Mother   . Diabetes Father   . Heart disease Father     Allergies  Allergen Reactions  . Tikosyn [Dofetilide]     Prolonged QT/QTc  . Ambien [Zolpidem] Other (See Comments)    Hallucinations, up walking around  . Codeine Phosphate Nausea And Vomiting  . Medrol [Methylprednisolone] Other (See Comments)    Felt really weird w the high dose oral steroid, but tolerates low doses or oral steroid    Current Outpatient Medications on File Prior to Visit  Medication Sig Dispense Refill  . amiodarone (PACERONE) 200 MG tablet Take 1 tablet (200 mg total) by mouth daily. 90 tablet 3  . amoxicillin (AMOXIL) 500 MG capsule Take 2,000 mg by mouth See admin instructions. Take 2000 mg by mouth 1 hour prior to dental appointment  5  . atorvastatin (LIPITOR) 40 MG tablet TAKE 1 TABLET BY MOUTH EVERYDAY AT BEDTIME 90 tablet 1  . Cholecalciferol (VITAMIN D3) 2000 units capsule Take 2,000 Units by mouth daily.    . clotrimazole-betamethasone (LOTRISONE) cream Apply 1 application topically 2 (two) times daily. 30 g 0  . fexofenadine (ALLEGRA) 180 MG tablet Take 180 mg by mouth daily as needed for allergies or rhinitis.    . fluticasone (FLONASE) 50 MCG/ACT nasal spray Place 1 spray into both nostrils daily as needed for allergies.     . furosemide (LASIX) 20 MG tablet Take 1 tablet (20 mg total)  by mouth 2 (two) times daily. 180 tablet 3  . glucose blood test strip Use to  check CBG AC and QHS. 200 each 12  . KLOR-CON M20 20 MEQ tablet Take 1 tablet (20 mEq total) by mouth daily. 90 tablet 3  . letrozole (FEMARA) 2.5 MG tablet Take 1 tablet (2.5 mg total) by mouth daily. 90 tablet 3  . losartan (COZAAR) 25 MG tablet TAKE 1 TABLET BY MOUTH TWICE A DAY 180 tablet 3  . metFORMIN (GLUCOPHAGE) 500 MG tablet TAKE 1 TABLET TWICE A DAY WITH A MEAL 180 tablet 1  . spironolactone (ALDACTONE) 25 MG tablet Take 1 tablet (25 mg total) by mouth daily. 90 tablet 3  . traMADol (ULTRAM) 50 MG tablet Take 1-2 tablets (50-100 mg total) by mouth every 6 (six) hours as needed. (Patient taking differently: Take 50-100 mg by mouth every 6 (six) hours as needed for moderate pain. ) 20 tablet 0  . XARELTO 20 MG TABS tablet TAKE 1 TABLET EVERY DAY WITH SUPPER 30 tablet 6   No current facility-administered medications on file prior to visit.     There were no vitals taken for this visit.    Review of Systems  Constitutional: Negative.   Respiratory: Positive for chest tightness and shortness of breath.   Cardiovascular: Positive for chest pain.  Neurological: Positive for dizziness, weakness and light-headedness.       Objective:   Physical Exam  Constitutional: She is oriented to person, place, and time. She appears well-developed and well-nourished. No distress.   Blood pressure 104/54 pulse 60  HENT:  Head: Normocephalic.  Right Ear: External ear normal.  Left Ear: External ear normal.  Mouth/Throat: Oropharynx is clear and moist.  Eyes: Pupils are equal, round, and reactive to light. Conjunctivae and EOM are normal.  Neck: Normal range of motion. Neck supple. No thyromegaly present.  Cardiovascular: Normal rate, regular rhythm, normal heart sounds and intact distal pulses.  Pulmonary/Chest: Effort normal and breath sounds normal.  Abdominal: Soft. Bowel sounds are normal. She exhibits no mass. There is no tenderness.  Musculoskeletal: Normal range of motion.    Lymphadenopathy:    She has no cervical adenopathy.  Neurological: She is alert and oriented to person, place, and time.  Skin: Skin is warm and dry. No rash noted.  Psychiatric: She has a normal mood and affect. Her behavior is normal.          Assessment & Plan:   Chest fullness associated with shortness of breath weakness and lightheadedness. Symptoms have resolved.  Possible angina precipitated by bradycardia and hypotension. Will check a troponin I level.  She report any new or worsening symptoms and will report to the ED if she becomes symptomatic.  She has had a very recent myocardial perfusion study that revealed no evidence of ischemia Paroxysmal atrial fibrillation Diabetes mellitus   cardiology follow-up as scheduled Check troponin Decrease Bystolic to 2.5 mg daily  Marletta Lor 's

## 2018-05-22 NOTE — Progress Notes (Signed)
Subjective:   Cynthia Herrera is a 72 y.o. female who presents for Medicare Annual (Subsequent) preventive examination.  Reports health as fair Children Spouse has passed on February 15, 2012   Just seen Dr. Rayann Heman; persistent Atrial fib  Dr Raliegh Ip 06/10-  Mammogram was abn; right breast was fine Cancer was back in the left breast;  Had chemo and radiation the 1st 2002  Took the breast out  Preventative now  Class for exercising at the [facility] her hip; on machine to warm her up  Taking tramadol (can't take NSAID)  Plus puppy died- Corge  Spouse died in 02-15-12  Other corge died in 02-02-2023 Back surgery in 2015-03-18 Both knees replaced   Last 6 years has been tough  Diet bMI 38  Chol/hdl 3.1  A1c 6.2 on metformin Not a sweet eater  Monitoring potatoes and bread Lost 15 lbs   Exercise Hurt on machine and will start off slow   Diabetic eye exam 06/2017   There are no preventive care reminders to display for this patient.  Mammogram 06/2017 Dexa  Due / will defer today due to pressure in chest  Pap 02/2012   Colonoscopy 1.2018 and due 10/2021  Discussed the shingrix - deferred due to symptoms noted below  Cardiac Risk Factors include: advanced age (>48men, >45 women);diabetes mellitus;dyslipidemia;family history of premature cardiovascular disease;hypertension;obesity (BMI >30kg/m2)     Objective:     Vitals: BP 100/60   Pulse 60   Ht 5\' 6"  (1.676 m)   Wt 237 lb (107.5 kg)   SpO2 95%   BMI 38.25 kg/m   Body mass index is 38.25 kg/m.  Advanced Directives 03/21/2018 01/02/2018 10/27/2017 09/29/2017 09/01/2017 08/09/2017 11/16/2016  Does Patient Have a Medical Advance Directive? No Yes Yes Yes Yes Yes Yes  Type of Advance Directive - Living will;Healthcare Power of Attorney Living will Living will;Healthcare Power of Attorney Living will Living will [place];Living will  Does patient want to make changes to medical advance directive? - - No - Patient declined - -  No - Patient declined -  Copy of [place] in Chart? - Yes - Yes - - No - copy requested  Would patient like information on creating a medical advance directive? No - Patient declined - - - - - -  Pre-existing out of facility DNR order (yellow form or pink MOST form) - - - - - - -    Tobacco Social History   Tobacco Use  Smoking Status Former Smoker  . Packs/day: 1.50  . Years: 35.00  . Pack years: 52.50  . Types: Cigarettes  . Last attempt to quit: 08/24/2001  . Years since quitting: 16.7  Smokeless Tobacco Never Used     Counseling given: Not Answered   Clinical Intake:    Past Medical History:  Diagnosis Date  . Arthritis   . Breast cancer, left breast ([place]) Feb 14, 2001   "then treated w/chemo and radiation"  . Breast cancer, left breast ([place]) 07/14/2017   "tx'd w/mastectomy"  . Chronic back pain    h/o lumbar stenosis; "no problem since my OR" (`10/27/2017)  . Chronic systolic CHF (congestive heart failure) (HCC)    takes Furosemide and Aldactone daily  . Complication of anesthesia    last surgery iv med when going to sleep"burned as injected"  . COPD (chronic obstructive pulmonary disease) (HCC)    no inhalers   . DCM (dilated cardiomyopathy) ([place])  EF 15-20% ? tachycardia induced - EF 50-55% on echo 2015  . Dyspnea    with exertion  . Dysrhythmia    afib  . Gallstones   . GERD (gastroesophageal reflux disease)    once in a while;depends on what she eats  . History of bronchitis    "chronic when I smoked; no problem since I quit in 2002" (10/27/2017)  . Hyperlipidemia   . Hypertension    takes Losartan and Metoprolol.   . Joint pain   . Muscle spasm    takes Flexeril daily as needed   . Nocturia   . Osteopenia   . Persistent atrial fibrillation ([place])    s/p TEE DCCV and repeat DCCV 11/14/2013  . Personal history of chemotherapy   . Personal history of colonic polyps    adenomas 03 and 08  . Personal history of radiation therapy   .  Pneumonia    hx  . Seasonal allergies   . Thyroid nodule   . Type II diabetes mellitus ([place])   . Vitamin D deficiency    takes Vit D   Past Surgical History:  Procedure Laterality Date  . AXILLARY LYMPH NODE DISSECTION Left 10/2001   persistent intramammary node Archie Endo 03/09/2011  . BACK SURGERY    . BREAST BIOPSY Left 2002  . BREAST BIOPSY Right 06/2017   "node bx was negative"  . BREAST BIOPSY Left 06/2017   "positive for cancer"  . BREAST LUMPECTOMY WITH RADIOACTIVE SEED LOCALIZATION Right 08/15/2017   Procedure: RIGHT BREAST LUMPECTOMY WITH RADIOACTIVE SEED LOCALIZATION;  Surgeon: Jovita Kussmaul, MD;  Location: [place];  Service: General;  Laterality: Right;  . CARDIAC CATHETERIZATION  2015  . CARDIOVERSION N/A 11/12/2013   Procedure: CARDIOVERSION;  Surgeon: Thayer Headings, MD;  [facility]COPY;  Service: Cardiovascular;  Laterality: N/A;  10:08  Dr. Marissa Nestle, anesthesia present, [place]   60mg ,  propofol 50mg , IV for elective cardioversion....Dr. Cathie Olden delievered synch 120 joules with successful cardioversion to NSR  . CARDIOVERSION N/A 11/12/2013   Procedure: CARDIOVERSION;  Surgeon: Thayer Headings, MD;  Location: [place];  Service: Cardiovascular;  Laterality: N/A;  . CARDIOVERSION N/A 11/14/2013   Procedure: CARDIOVERSION (BEDSIDE);  Surgeon: Larey Dresser, MD;  Location: [place];  Service: Cardiovascular;  Laterality: N/A;  . CARDIOVERSION N/A 02/22/2018   Procedure: CARDIOVERSION;  Surgeon: Sueanne Margarita, MD;  Location: [facility] ENDOSCOPY;  Service: Cardiovascular;  Laterality: N/A;  . CATARACT EXTRACTION W/ INTRAOCULAR LENS  IMPLANT, BILATERAL Bilateral ~ 2016  . COLONOSCOPY  2003, September 2008, April 01, 2011   adenomas 03 and 08, polyp 12, diverticulosis  . COLONOSCOPY WITH PROPOFOL N/A 11/16/2016   Procedure: COLONOSCOPY WITH PROPOFOL;  Surgeon: Gatha Mayer, MD;  Location: [facility] ENDOSCOPY;  Service: Endoscopy;  Laterality: N/A;  . JOINT REPLACEMENT    . LAPAROSCOPIC  CHOLECYSTECTOMY  2004  . LEFT AND RIGHT HEART CATHETERIZATION WITH CORONARY ANGIOGRAM N/A 02/01/2014   Procedure: LEFT AND RIGHT HEART CATHETERIZATION WITH CORONARY ANGIOGRAM;  Surgeon: Sueanne Margarita, MD;  Location: [place] CATH LAB;  Service: Cardiovascular;  Laterality: N/A;  . LUMBAR LAMINECTOMY/DECOMPRESSION MICRODISCECTOMY N/A 03/19/2014   Procedure: LUMBAR LAMINECTOMY/DECOMPRESSION MICRODISCECTOMY 4 LEVEL;  Surgeon: Kristeen Miss, MD;  Location: [place] NEURO ORS;  Service: Neurosurgery;  Laterality: N/A;  L1-2 L2-3 L3-4 L4-5 Laminectomy/Foraminotomy  . MASS EXCISION Left 08/15/2017   Procedure: EXCISION OF LEFT BREAST MASS;  Surgeon: Jovita Kussmaul, MD;  Location: [place];  Service: General;  Laterality:  Left;  . MASTECTOMY Left 10/27/2017  . MASTECTOMY PARTIAL / LUMPECTOMY W/ AXILLARY LYMPHADENECTOMY  05/08/2001   Archie Endo 03/09/2011  . PORT-A-CATH REMOVAL  2003  . PORTA CATH INSERTION  2002  . TEE WITHOUT CARDIOVERSION N/A 11/12/2013   Procedure: TRANSESOPHAGEAL ECHOCARDIOGRAM (TEE);  Surgeon: Thayer Headings, MD;  Location: [place];  Service: Cardiovascular;  Laterality: N/A;  pt b/p low, pt buccal membranes very dry, lips scapped, pt c/o thirst. NPO since MN and iv FLUIDS TOTAL INFUSING AT TOTAL 20ML HR....  Dr. Cathie Olden order allow NS to bolus during procedure....very dry,NS bolus 250 ml total..pt responding well to meds..  . TOTAL KNEE ARTHROPLASTY Left 2006  . TOTAL KNEE ARTHROPLASTY Right 05/10/2016   Procedure: RIGHT TOTAL KNEE ARTHROPLASTY;  Surgeon: Gaynelle Arabian, MD;  Location: [facility] ORS;  Service: Orthopedics;  Laterality: Right;  With adductor block  . TOTAL MASTECTOMY Left 10/27/2017   Procedure: LEFT MASTECTOMY;  Surgeon: Jovita Kussmaul, MD;  Location: [place];  Service: General;  Laterality: Left;  . TUBAL LIGATION     Family History  Problem Relation Age of Onset  . Dementia Sister   . Diabetes Sister   . Alzheimer's disease Sister   . Diabetes Brother        x 2  . Heart disease  Brother   . Irritable bowel syndrome Brother   . Liver cancer Mother   . Diabetes Mother   . Pancreatic cancer Mother   . Diabetes Father   . Heart disease Father    Social History   Socioeconomic History  . Marital status: Widowed    Spouse name: Not on file  . Number of children: 2  . Years of education: Not on file  . Highest education level: Not on file  Occupational History  . Occupation: retired  Scientific laboratory technician  . Financial resource strain: Not on file  . Food insecurity:    Worry: Not on file    Inability: Not on file  . Transportation needs:    Medical: Not on file    Non-medical: Not on file  Tobacco Use  . Smoking status: Former Smoker    Packs/day: 1.50    Years: 35.00    Pack years: 52.50    Types: Cigarettes    Last attempt to quit: 08/24/2001    Years since quitting: 16.7  . Smokeless tobacco: Never Used  Substance and Sexual Activity  . Alcohol use: No  . Drug use: No  . Sexual activity: Never    Birth control/protection: Post-menopausal  Lifestyle  . Physical activity:    Days per week: Not on file    Minutes per session: Not on file  . Stress: Not on file  Relationships  . Social connections:    Talks on phone: Not on file    Gets together: Not on file    Attends religious service: Not on file    Active member of club or organization: Not on file    Attends meetings of clubs or organizations: Not on file    Relationship status: Not on file  Other Topics Concern  . Not on file  Social History Narrative  . Not on file    Outpatient Encounter Medications as of 05/22/2018  Medication Sig  . amiodarone (PACERONE) 200 MG tablet Take 1 tablet (200 mg total) by mouth daily.  Marland Kitchen amoxicillin (AMOXIL) 500 MG capsule Take 2,000 mg by mouth See admin instructions. Take 2000 mg by mouth 1 hour prior to  dental appointment  . atorvastatin (LIPITOR) 40 MG tablet TAKE 1 TABLET BY MOUTH EVERYDAY AT BEDTIME  . Cholecalciferol (VITAMIN D3) 2000 units capsule  Take 2,000 Units by mouth daily.  . clotrimazole-betamethasone (LOTRISONE) cream Apply 1 application topically 2 (two) times daily.  . fexofenadine (ALLEGRA) 180 MG tablet Take 180 mg by mouth daily as needed for allergies or rhinitis.  . fluticasone (FLONASE) 50 MCG/ACT nasal spray Place 1 spray into both nostrils daily as needed for allergies.   . furosemide (LASIX) 20 MG tablet Take 1 tablet (20 mg total) by mouth 2 (two) times daily.  Marland Kitchen glucose blood test strip Use to check CBG AC and QHS.  Marland Kitchen KLOR-CON M20 20 MEQ tablet Take 1 tablet (20 mEq total) by mouth daily.  Marland Kitchen letrozole (FEMARA) 2.5 MG tablet Take 1 tablet (2.5 mg total) by mouth daily.  Marland Kitchen losartan (COZAAR) 25 MG tablet TAKE 1 TABLET BY MOUTH TWICE A DAY  . metFORMIN (GLUCOPHAGE) 500 MG tablet TAKE 1 TABLET TWICE A DAY WITH A MEAL  . nebivolol (BYSTOLIC) 5 MG tablet Take 1 tablet (5 mg total) by mouth daily.  Marland Kitchen spironolactone (ALDACTONE) 25 MG tablet Take 1 tablet (25 mg total) by mouth daily.  . traMADol (ULTRAM) 50 MG tablet Take 1-2 tablets (50-100 mg total) by mouth every 6 (six) hours as needed. (Patient taking differently: Take 50-100 mg by mouth every 6 (six) hours as needed for moderate pain. )  . XARELTO 20 MG TABS tablet TAKE 1 TABLET EVERY DAY WITH SUPPER  . [DISCONTINUED] BYSTOLIC 2.5 MG tablet Take 5 mg by mouth daily.    No facility-administered encounter medications on file as of 05/22/2018.     Activities of Daily Living In your present state of health, do you have any difficulty performing the following activities: 05/22/2018 03/21/2018  Hearing? N N  Comment - -  Vision? N N  Difficulty concentrating or making decisions? N N  Walking or climbing stairs? N N  Dressing or bathing? N N  Doing errands, shopping? N Y  Conservation officer, nature and eating ? N -  Using the Toilet? N -  In the past six months, have you accidently leaked urine? N -  Do you have problems with loss of bowel control? N -  Managing your Medications?  N -  Managing your Finances? N -  Housekeeping or managing your Housekeeping? N -  Some recent data might be hidden    Patient Care Team: Marletta Lor, MD as PCP - Bethena Roys, MD as PCP - Electrophysiology (Cardiology)    Assessment:   This is a routine wellness examination for [place].  Exercise Activities and Dietary recommendations Current Exercise Habits: Structured exercise class  Goals    . Patient Stated     Plan to have a dog by winter     . Patient Stated     Exercise; go low and slow Tuesday and Thursday and tell whomever that you in rehab       Fall Risk Fall Risk  05/22/2018 09/01/2017 02/03/2017 11/10/2015 03/15/2014  Falls in the past year? No No No No No     Depression Screen PHQ 2/9 Scores 05/22/2018 02/03/2017 11/10/2015 03/15/2014  PHQ - 2 Score 0 0 0 0     Cognitive Function MMSE - Mini Mental State Exam 05/22/2018  Not completed: (No Data)     Ad8 score reviewed for issues:  Issues making decisions:  Less interest in hobbies / activities:  Repeats questions, stories (family complaining):  Trouble using ordinary gadgets (microwave, computer, phone):  Forgets the month or year:   Mismanaging finances:   Remembering appts:  Daily problems with thinking and/or memory: Ad8 score is=0        Immunization History  Administered Date(s) Administered  . Influenza Split 09/29/2011, 07/20/2012  . Influenza Whole 08/14/2008  . Influenza,inj,Quad PF,6+ Mos 10/23/2013, 08/20/2014, 08/14/2015, 08/27/2016  . Pneumococcal Conjugate-13 10/23/2013  . Pneumococcal Polysaccharide-23 06/18/2011  . Tdap 06/18/2011  . Zoster 07/20/2012   Screening Tests Health Maintenance  Topic Date Due  . FOOT EXAM  07/23/2018 (Originally 05/03/2017)  . Hepatitis C Screening  08/24/2018 (Originally 1946-04-16)  . DEXA SCAN  05/23/2019 (Originally 10/02/2010)  . INFLUENZA VACCINE  05/25/2018  . HEMOGLOBIN A1C  07/05/2018  . OPHTHALMOLOGY EXAM   07/18/2018  . MAMMOGRAM  07/12/2019  . TETANUS/TDAP  06/17/2021  . COLONOSCOPY  11/16/2021  . PNA vac Low Risk Adult  Completed        Plan:      PCP Notes   Health Maintenance Eye exam scheduled for sept Foot exam and dexa deferred due to new symptoms Hep C deferred as well   Abnormal Screens  The patient c/o of "weak episode" prior to coming today. Had to deep breath and resolved in 15 minutes. Had low bp of 86 / 50 60 and later 100 60  Rate 44 at the first BP check Radial pulse slow;  Rate corrected to 60 apical and Bp  100/60 States she still feels pressure in her chest  She felt this a long time ago. This has not gone away so she is concerned Dr. Raliegh Ip opened up a slot to see her today    Referrals  none  Patient concerns; Had one episode of atrial fib this am;  Set down and did deep breathing and this went away. Lasted 15 minutes;  No chest pain, (later c/o of pressure under left breast  Lots of grief issues   Nurse Concerns; As noted  Next PCP apt Had OV worked in today        I have personally reviewed and noted the following in the patient's chart:   . Medical and social history . Use of alcohol, tobacco or illicit drugs  . Current medications and supplements . Functional ability and status . Nutritional status . Physical activity . Advanced directives . List of other physicians . Hospitalizations, surgeries, and ER visits in previous 12 months . Vitals . Screenings to include cognitive, depression, and falls . Referrals and appointments  In addition, I have reviewed and discussed with patient certain preventive protocols, quality metrics, and best practice recommendations. A written personalized care plan for preventive services as well as general preventive health recommendations were provided to patient.     Wynetta Fines, RN  05/22/2018   72 year old patient who has a complicated cardiac history who was seen today for a subsequent  Medicare wellness visit.  Findings reviewed and agree. The patient experienced earlier today an episode of chest tightness with shortness of breath lightheadedness and weakness.  The patient was also evaluated for acute coronary insufficiency.  Marletta Lor

## 2018-05-22 NOTE — Patient Instructions (Signed)
  Cynthia Herrera , Thank you for taking time to come for your Medicare Wellness Visit. I appreciate your ongoing commitment to your health goals. Please review the following plan we discussed and let me know if I can assist you in the future.   These are the goals we discussed: Goals    . Patient Stated     Plan to have a dog by winter     . Patient Stated     Exercise; go low and slow Tuesday and Thursday and tell whomever that you in rehab       This is a list of the screening recommended for you and due dates:  Health Maintenance  Topic Date Due  . Complete foot exam   07/23/2018*  .  Hepatitis C: One time screening is recommended by Center for Disease Control  (CDC) for  adults born from [birth year] through 1966.   08/24/2018*  . DEXA scan (bone density measurement)  05/23/2019*  . Flu Shot  05/25/2018  . Hemoglobin A1C  07/05/2018  . Eye exam for diabetics  07/18/2018  . Mammogram  07/12/2019  . Tetanus Vaccine  06/17/2021  . Colon Cancer Screening  11/16/2021  . Pneumonia vaccines  Completed  *Topic was postponed. The date shown is not the original due date.

## 2018-05-22 NOTE — Patient Instructions (Signed)
Decrease bisoprolol to   2-1/2 mg once daily  Call or return to clinic prn if these symptoms worsen or fail to improve as anticipated.  Cardiology follow-up as scheduled  Report any new or worsening symptoms

## 2018-05-23 ENCOUNTER — Telehealth: Payer: Self-pay | Admitting: Internal Medicine

## 2018-05-23 MED ORDER — NEBIVOLOL HCL 2.5 MG PO TABS: 2.5000 mg | ORAL_TABLET | Freq: Every day | ORAL | 3 refills | Status: DC

## 2018-05-23 NOTE — Telephone Encounter (Signed)
Spoke to pharmacist to cancel Rx and the corrected Rx was sent. Pt notified. No further action needed!

## 2018-05-23 NOTE — Telephone Encounter (Signed)
Copied from [place] [phone]. Topic: General - Other >> May 23, 2018 10:14 AM Lennox Solders wrote: Reason for CRM: pt is calling and bystolic 5 mg was sent to pharm instead of 2.5 mg. Please send 2.5 mg to [facility]. Pt was seen yesterday

## 2018-05-24 ENCOUNTER — Ambulatory Visit (HOSPITAL_BASED_OUTPATIENT_CLINIC_OR_DEPARTMENT_OTHER): Payer: Medicare Other | Attending: Internal Medicine | Admitting: Cardiology

## 2018-05-24 VITALS — Ht 66.0 in | Wt 238.0 lb

## 2018-05-24 DIAGNOSIS — G4736 Sleep related hypoventilation in conditions classified elsewhere: Secondary | ICD-10-CM | POA: Diagnosis not present

## 2018-05-24 DIAGNOSIS — I110 Hypertensive heart disease with heart failure: Secondary | ICD-10-CM | POA: Insufficient documentation

## 2018-05-24 DIAGNOSIS — I481 Persistent atrial fibrillation: Secondary | ICD-10-CM | POA: Insufficient documentation

## 2018-05-24 DIAGNOSIS — E119 Type 2 diabetes mellitus without complications: Secondary | ICD-10-CM | POA: Insufficient documentation

## 2018-05-24 DIAGNOSIS — G4733 Obstructive sleep apnea (adult) (pediatric): Secondary | ICD-10-CM | POA: Diagnosis not present

## 2018-05-24 DIAGNOSIS — I509 Heart failure, unspecified: Secondary | ICD-10-CM | POA: Diagnosis not present

## 2018-05-24 DIAGNOSIS — I493 Ventricular premature depolarization: Secondary | ICD-10-CM | POA: Diagnosis not present

## 2018-05-24 DIAGNOSIS — E669 Obesity, unspecified: Secondary | ICD-10-CM | POA: Diagnosis not present

## 2018-05-24 DIAGNOSIS — J449 Chronic obstructive pulmonary disease, unspecified: Secondary | ICD-10-CM | POA: Insufficient documentation

## 2018-05-24 DIAGNOSIS — R400 Somnolence: Secondary | ICD-10-CM

## 2018-05-24 DIAGNOSIS — R0683 Snoring: Secondary | ICD-10-CM | POA: Insufficient documentation

## 2018-05-24 DIAGNOSIS — Z6838 Body mass index (BMI) 38.0-38.9, adult: Secondary | ICD-10-CM | POA: Diagnosis not present

## 2018-05-24 DIAGNOSIS — G4710 Hypersomnia, unspecified: Secondary | ICD-10-CM | POA: Insufficient documentation

## 2018-05-24 DIAGNOSIS — I4819 Other persistent atrial fibrillation: Secondary | ICD-10-CM

## 2018-05-24 NOTE — Progress Notes (Signed)
[facility] PREP Weekly Session   Patient Details  Name: Cynthia Herrera MRN: [id number] Date of Birth: Jul 09, 1946 Age: 72 y.o. PCP: Marletta Lor, MD  Vitals:   05/23/18 1354  Weight: 235 lb (106.6 kg)    [facility] Weekly seesion - 05/24/18 1300      Weekly Session   Topic Discussed  Healthy eating tips    Minutes exercised this week  180 minutes cardio   cardio   Classes attended to date  2      Nutrition celebrations:"more water & veggies"  Vanita Ingles 05/24/2018, 1:55 PM

## 2018-05-24 NOTE — Progress Notes (Signed)
[facility] PREP Weekly Session   Patient Details  Name: Cynthia Herrera MRN: [id number] Date of Birth: [birth year] Age: 72 y.o. PCP: Marletta Lor, MD  Vitals:   05/16/18 1251  Weight: 238 lb 3.7 oz (108.1 kg)    [facility] [employer] Weekly seesion - 05/24/18 1200      Weekly Session   Topic Discussed  Other ways to be active    Classes attended to date  1       Fun things you did since last meeting:"went out of town" Things you are grateful for"Family" Nutrition celebrations:"ate out twice"  Vanita Ingles 05/24/2018, 12:51 PM

## 2018-05-25 ENCOUNTER — Ambulatory Visit: Payer: Self-pay | Admitting: Podiatry

## 2018-05-25 ENCOUNTER — Telehealth: Payer: Self-pay | Admitting: *Deleted

## 2018-05-25 DIAGNOSIS — R400 Somnolence: Secondary | ICD-10-CM

## 2018-05-25 NOTE — Procedures (Signed)
   Patient Name:Cynthia Herrera, Cynthia Herrera Date:10/11/2017 05/24/2018   Gender: Female  D.O.B: [birth year]  Age (years): 61  Referring Provider: [name] MD, ABSM  Height (inches): 66  Interpreting Physician: [name] MD, ABSM  Weight (lbs): 238  RPSGT: Laren Everts  BMI: 47  MRN: [id number]  Neck Size: 15.50   CLINICAL INFORMATION  Sleep Study Type: NPSG Indication for sleep study: Congestive Heart Failure, COPD, Diabetes, Excessive Daytime Sleepiness, Hypertension, Obesity Epworth Sleepiness Score: 4  SLEEP STUDY TECHNIQUE  As per the AASM Manual for the Scoring of Sleep and Associated Events v2.3 (April 2016) with a hypopnea requiring 4% desaturations. The channels recorded and monitored were frontal, central and occipital EEG, electrooculogram (EOG), submentalis EMG (chin), nasal and oral airflow, thoracic and abdominal wall motion, anterior tibialis EMG, snore microphone, electrocardiogram, and pulse oximetry.  MEDICATIONS  Medications self-administered by patient taken the night of the study : N/A  SLEEP ARCHITECTURE  The study was initiated at 10:36:19 PM and ended at 4:40:23 AM. Sleep onset time was 112.2 minutes and the sleep efficiency was 29.3%%. The total sleep time was 106.5 minutes. Stage REM latency was N/A minutes. The patient spent 31.0%% of the night in stage N1 sleep, 69.0%% in stage N2 sleep, 0.0%% in stage N3 and 0% in REM. Alpha intrusion was absent. Supine sleep was 100.00%.  RESPIRATORY PARAMETERS  The overall apnea/hypopnea index (AHI) was 47.3 per hour. There were 8 total apneas, including 8 obstructive, 0 central and 0 mixed apneas. There were 76 hypopneas and 24 RERAs. The AHI during Stage REM sleep was N/A per hour. AHI while supine was 47.3 per hour. The mean oxygen saturation was 90.0%. The minimum SpO2 during sleep was 79.0%. moderate snoring was noted during this study.  CARDIAC DATA  The 2 lead EKG demonstrated sinus rhythm. The mean  heart rate was 60.6 beats per minute. Other EKG findings include: PVCs.   LEG MOVEMENT DATA  The total PLMS were 0 with a resulting PLMS index of 0.0. Associated arousal with leg movement index was 0.0 .  IMPRESSIONS  - Severe obstructive sleep apnea occurred during this study (AHI = 47.3/h). - No significant central sleep apnea occurred during this study (CAI = 0.0/h).  - Moderate oxygen desaturation was noted during this study (Min O2 = 79.0%).  - The patient snored with moderate snoring volume.  - EKG findings include PVCs.  - Clinically significant periodic limb movements did not occur during sleep. No significant associated arousals.  DIAGNOSIS  - Obstructive Sleep Apnea (327.23 [G47.33 ICD-10])  - Nocturnal Hypoxemia (327.26 [G47.36 ICD-10])  RECOMMENDATIONS  - Therapeutic CPAP titration to determine optimal pressure required to alleviate sleep disordered breathing.  - Positional therapy avoiding supine position during sleep.  - Avoid alcohol, sedatives and other CNS depressants that may worsen sleep apnea and disrupt normal sleep architecture.  - Sleep hygiene should be reviewed to assess factors that may improve sleep quality.  - Weight management and regular exercise should be initiated or continued if appropriate.  [Electronically signed] 05/25/2018 09:03 AM [name] MD, ABSM  Diplomate, American Board of Sleep Medicine

## 2018-05-25 NOTE — Telephone Encounter (Signed)
-----   Message from Sueanne Margarita, MD sent at 05/25/2018  9:05 AM EDT ----- Please let patient know that they have sleep apnea and recommend CPAP titration. Please set up titration in the sleep lab.

## 2018-05-25 NOTE — Telephone Encounter (Signed)
Informed patient of sleep study results and patient understanding was verbalized. Patient understands her sleep study showed they have sleep apnea and recommend CPAP titration. Pt is aware and agreeable to these results. 

## 2018-05-29 ENCOUNTER — Telehealth: Payer: Self-pay | Admitting: *Deleted

## 2018-05-29 NOTE — Telephone Encounter (Signed)
Staff message sent to Cynthia Herrera ok to schedule CPAP titration study. Per [facility] web portal no PA is required. Decision VO:[id number].

## 2018-05-29 NOTE — Telephone Encounter (Signed)
-----   Message from Freada Bergeron, [place] sent at 05/25/2018  5:37 PM EDT ----- Regarding: pre cert they have sleep apnea and recommend CPAP titration

## 2018-05-31 NOTE — Progress Notes (Signed)
[facility] PREP Weekly Session   Patient Details  Name: Cynthia Herrera MRN: [id number] Date of Birth: Apr 26, 1946 Age: 72 y.o. PCP: Marletta Lor, MD  Vitals:   05/30/18 1503  Weight: 241 lb (109.3 kg)    [facility] Weekly seesion - 05/31/18 1500      Weekly Session   Topic Discussed  Health habits      Fun things you did since last meeting:"party" Things you are grateful for:"family"  Vanita Ingles 05/31/2018, 3:04 PM

## 2018-06-01 ENCOUNTER — Encounter: Payer: Self-pay | Admitting: *Deleted

## 2018-06-01 ENCOUNTER — Telehealth: Payer: Self-pay | Admitting: *Deleted

## 2018-06-01 NOTE — Telephone Encounter (Signed)
Patient is scheduled for CPAP Titration on 06/23/18. Patient understands her titration study will be done at [facility] sleep lab. Patient understands she will receive a letter in a week or so detailing appointment, date, time, and location. Patient understands to call if she does not receive the letter  in a timely manner. Patient agrees with treatment and thanked me for call.

## 2018-06-01 NOTE — Telephone Encounter (Signed)
-----   Message from Lauralee Evener, [place] sent at 05/29/2018  2:47 PM EDT ----- Regarding: RE: pre cert Per [employer] no PA is required. Ok to schedule titration study. Decision [id number]. ----- Message ----- From: Freada Bergeron, CMA Sent: 05/25/2018   5:37 PM To: [facility] Subject: pre cert                                       they have sleep apnea and recommend CPAP titration

## 2018-06-07 NOTE — Progress Notes (Signed)
[facility] PREP Weekly Session   Patient Details  Name: Cynthia Herrera MRN: [id number] Date of Birth: 1946-01-05 Age: 72 y.o. PCP: Marletta Lor, MD  Vitals:   06/05/18 1307  Weight: 239 lb (108.4 kg)    [facility] Weekly seesion - 06/07/18 1300      Weekly Session   Topic Discussed  Restaurant Eating      Fun things you did since last meeting:"Visited w/great grandchild" Barriers:"over eating"   Vanita Ingles 06/07/2018, 1:07 PM

## 2018-06-15 ENCOUNTER — Ambulatory Visit (HOSPITAL_COMMUNITY)
Admission: RE | Admit: 2018-06-15 | Discharge: 2018-06-15 | Disposition: A | Discharge status: Home / Self Care | Payer: Medicare Other | Source: Ambulatory Visit | Attending: Internal Medicine | Admitting: Internal Medicine

## 2018-06-15 VITALS — BP 100/52 | HR 65 | Wt 239.8 lb

## 2018-06-15 DIAGNOSIS — M549 Dorsalgia, unspecified: Secondary | ICD-10-CM | POA: Insufficient documentation

## 2018-06-15 DIAGNOSIS — M1990 Unspecified osteoarthritis, unspecified site: Secondary | ICD-10-CM | POA: Insufficient documentation

## 2018-06-15 DIAGNOSIS — K219 Gastro-esophageal reflux disease without esophagitis: Secondary | ICD-10-CM | POA: Diagnosis not present

## 2018-06-15 DIAGNOSIS — E669 Obesity, unspecified: Secondary | ICD-10-CM | POA: Diagnosis not present

## 2018-06-15 DIAGNOSIS — I420 Dilated cardiomyopathy: Secondary | ICD-10-CM | POA: Diagnosis not present

## 2018-06-15 DIAGNOSIS — Z79899 Other long term (current) drug therapy: Secondary | ICD-10-CM | POA: Insufficient documentation

## 2018-06-15 DIAGNOSIS — I110 Hypertensive heart disease with heart failure: Secondary | ICD-10-CM | POA: Diagnosis not present

## 2018-06-15 DIAGNOSIS — Z79891 Long term (current) use of opiate analgesic: Secondary | ICD-10-CM | POA: Diagnosis not present

## 2018-06-15 DIAGNOSIS — Z7984 Long term (current) use of oral hypoglycemic drugs: Secondary | ICD-10-CM | POA: Diagnosis not present

## 2018-06-15 DIAGNOSIS — I5042 Chronic combined systolic (congestive) and diastolic (congestive) heart failure: Secondary | ICD-10-CM

## 2018-06-15 DIAGNOSIS — G8929 Other chronic pain: Secondary | ICD-10-CM | POA: Diagnosis not present

## 2018-06-15 DIAGNOSIS — E559 Vitamin D deficiency, unspecified: Secondary | ICD-10-CM | POA: Diagnosis not present

## 2018-06-15 DIAGNOSIS — Z6838 Body mass index (BMI) 38.0-38.9, adult: Secondary | ICD-10-CM | POA: Diagnosis not present

## 2018-06-15 DIAGNOSIS — E119 Type 2 diabetes mellitus without complications: Secondary | ICD-10-CM | POA: Insufficient documentation

## 2018-06-15 DIAGNOSIS — Z792 Long term (current) use of antibiotics: Secondary | ICD-10-CM | POA: Insufficient documentation

## 2018-06-15 DIAGNOSIS — Z853 Personal history of malignant neoplasm of breast: Secondary | ICD-10-CM | POA: Insufficient documentation

## 2018-06-15 DIAGNOSIS — I482 Chronic atrial fibrillation: Secondary | ICD-10-CM | POA: Diagnosis not present

## 2018-06-15 DIAGNOSIS — E785 Hyperlipidemia, unspecified: Secondary | ICD-10-CM | POA: Diagnosis not present

## 2018-06-15 DIAGNOSIS — N959 Unspecified menopausal and perimenopausal disorder: Secondary | ICD-10-CM | POA: Insufficient documentation

## 2018-06-15 DIAGNOSIS — Z888 Allergy status to other drugs, medicaments and biological substances status: Secondary | ICD-10-CM | POA: Insufficient documentation

## 2018-06-15 DIAGNOSIS — Z9221 Personal history of antineoplastic chemotherapy: Secondary | ICD-10-CM | POA: Diagnosis not present

## 2018-06-15 DIAGNOSIS — Z8249 Family history of ischemic heart disease and other diseases of the circulatory system: Secondary | ICD-10-CM | POA: Insufficient documentation

## 2018-06-15 DIAGNOSIS — Z923 Personal history of irradiation: Secondary | ICD-10-CM | POA: Diagnosis not present

## 2018-06-15 DIAGNOSIS — R9431 Abnormal electrocardiogram [ECG] [EKG]: Secondary | ICD-10-CM | POA: Insufficient documentation

## 2018-06-15 DIAGNOSIS — Z885 Allergy status to narcotic agent status: Secondary | ICD-10-CM | POA: Insufficient documentation

## 2018-06-15 DIAGNOSIS — G4733 Obstructive sleep apnea (adult) (pediatric): Secondary | ICD-10-CM | POA: Diagnosis not present

## 2018-06-15 DIAGNOSIS — Z833 Family history of diabetes mellitus: Secondary | ICD-10-CM | POA: Insufficient documentation

## 2018-06-15 DIAGNOSIS — J449 Chronic obstructive pulmonary disease, unspecified: Secondary | ICD-10-CM | POA: Insufficient documentation

## 2018-06-15 DIAGNOSIS — Z87891 Personal history of nicotine dependence: Secondary | ICD-10-CM | POA: Insufficient documentation

## 2018-06-15 DIAGNOSIS — Z800 Family history of malignant neoplasm of digestive organs: Secondary | ICD-10-CM | POA: Insufficient documentation

## 2018-06-15 DIAGNOSIS — Z9010 Acquired absence of unspecified breast and nipple: Secondary | ICD-10-CM | POA: Insufficient documentation

## 2018-06-15 DIAGNOSIS — Z7901 Long term (current) use of anticoagulants: Secondary | ICD-10-CM | POA: Insufficient documentation

## 2018-06-15 DIAGNOSIS — I5022 Chronic systolic (congestive) heart failure: Secondary | ICD-10-CM | POA: Diagnosis not present

## 2018-06-15 DIAGNOSIS — I10 Essential (primary) hypertension: Secondary | ICD-10-CM | POA: Diagnosis not present

## 2018-06-15 DIAGNOSIS — R001 Bradycardia, unspecified: Secondary | ICD-10-CM | POA: Insufficient documentation

## 2018-06-15 DIAGNOSIS — Z818 Family history of other mental and behavioral disorders: Secondary | ICD-10-CM | POA: Insufficient documentation

## 2018-06-15 NOTE — Progress Notes (Signed)
Advanced Heart Failure Clinic Consult Note   Referring Physician: Dr. Radford Pax PCP: Marletta Lor, MD PCP-Cardiologist: No primary care provider on file.   HPI:  Cynthia Herrera is a 72 y.o. female with h/o chronic systolic HF (EF 69%), h/o breast cancer treated with chemo/radiation, COPD, obesity, persistent Afib, OSA on CPAP, and HTN referred by Dr. Lajoyce Lauber for HF evaluation.   Had chemo/radiation for breast cancer in 2002. Not sure if she got adriamycin.   Cath 2015  Left main:calcified but widely patent and bifurcates into an LAD and left circumflex CVE:LFYBOFBPZ but widely patent throughout its course to the apex.  It gives rise to a large bifurcating diagonal which is widely patent.  The LAD then gives rise to a second moderate sized diagonal which is patent.   WCH:ENIDPOEUM but widely patent and traverses the AV groove.  It gives rise to a large OM which bifurcates into 2 daughter branches which are widely patent.  PNT:IRWERX patent and gives rise to 2 acute RV marginal branches which are patent.  Distally it bifurcates into a PDA and PL branches which are patent.   LV-gram done in the RAO projection: Ejection fraction = 45%  Echo 08/2014 LVEF 50-55%  Referred to Afib clinic in 02/2018 when noted to be in Afib with decreased LVEF EF 25%. Admitted 04/13/18 for Tikosyn, but failed due to QT prolongation. Rechallenged on amiodarone (previously failed due to dizziness.)  Last seen by Dr. Rayann Heman 04/24/2018. Continued to have intermittent Afib but was noted to be in NSR that day. Complained of fatigue and poor exercise tolerance, but otherwise was doing well.   She presents today to for HF consultation at the request of Dr. Radford Pax. Feeling OK this am but says she is exhausted and and has a hard time doing anything. Started [facility] program in July. Had an episode of hypotension and bradycardia to 44 at PCP. BB cut back. She gets by with her ADLs. OK as far as SOB, but has poor energy.  She does OK on flat ground or down steps, but has SOB walking up. She denies PND/Orthopnea. She denies peripheral edema and hasn't needed any lasix. Denies any exertional chest pain, but has occasional chest pressure. She has severe OSA (AHI 47) and is scheduled for CPAP titration.   Echo 03/03/2018 LVEF 25%, mild LAE, mild MR, PA peak pressure 29 mm Hg.  Echo 08/2014 LVEF 50-55%  Myoview 04/10/18 LVEF 41%, No reversible ischemia.  Review of Systems: [y] = yes, [ ]  = no   General: Weight gain [ ] ; Weight loss [ ] ; Anorexia [ ] ; Fatigue [y]; Fever [ ] ; Chills [ ] ; Weakness [ ]   Cardiac: Chest pain/pressure [ ] ; Resting SOB [ ] ; Exertional SOB [y]; Orthopnea [ ] ; Pedal Edema [ ] ; Palpitations [ ] ; Syncope [ ] ; Presyncope [ ] ; Paroxysmal nocturnal dyspnea[ ]   Pulmonary: Cough [ ] ; Wheezing[ ] ; Hemoptysis[ ] ; Sputum [ ] ; Snoring Blue.Reese ]  GI: Vomiting[ ] ; Dysphagia[ ] ; Melena[ ] ; Hematochezia [ ] ; Heartburn[ ] ; Abdominal pain [ ] ; Constipation [ ] ; Diarrhea [ ] ; BRBPR [ ]   GU: Hematuria[ ] ; Dysuria [ ] ; Nocturia[ ]   Vascular: Pain in legs with walking [ ] ; Pain in feet with lying flat [ ] ; Non-healing sores [ ] ; Stroke [ ] ; TIA [ ] ; Slurred speech [ ] ;  Neuro: Headaches[ ] ; Vertigo[ ] ; Seizures[ ] ; Paresthesias[ ] ;Blurred vision [ ] ; Diplopia [ ] ; Vision changes [ ]   Ortho/Skin: Arthritis Blue.Reese ]; Joint  pain [ y]; Muscle pain [ ] ; Joint swelling [ ] ; Back Pain [ ] ; Rash [ ]   Psych: Depression[ ] ; Anxiety[ ]   Heme: Bleeding problems [ ] ; Clotting disorders [ ] ; Anemia [ ]   Endocrine: Diabetes [ ] ; Thyroid dysfunction[ ]    Past Medical History:  Diagnosis Date  . Arthritis   . Breast cancer, left breast ([place]) 2002   "then treated w/chemo and radiation"  . Breast cancer, left breast ([place]) 07/14/2017   "tx'd w/mastectomy"  . Chronic back pain    h/o lumbar stenosis; "no problem since my OR" (`10/27/2017)  . Chronic systolic CHF (congestive heart failure) (H[facility])    takes Furosemide and Aldactone daily  .  Complication of anesthesia    last surgery iv med when going to sleep"burned as injected"  . COPD (chronic obstructive pulmonary disease) ([facility])    no inhalers   . DCM (dilated cardiomyopathy) ([place])    EF 15-20% ? tachycardia induced - EF 50-55% on echo 2015  . Dyspnea    with exertion  . Dysrhythmia    afib  . Gallstones   . GERD (gastroesophageal reflux disease)    once in a while;depends on what she eats  . History of bronchitis    "chronic when I smoked; no problem since I quit in 2002" (10/27/2017)  . Hyperlipidemia   . Hypertension    takes Losartan and Metoprolol.   . Joint pain   . Muscle spasm    takes Flexeril daily as needed   . Nocturia   . Osteopenia   . Persistent atrial fibrillation ([place])    s/p TEE DCCV and repeat DCCV 11/14/2013  . Personal history of chemotherapy   . Personal history of colonic polyps    adenomas 03 and 08  . Personal history of radiation therapy   . Pneumonia    hx  . Seasonal allergies   . Thyroid nodule   . Type II diabetes mellitus ([place])   . Vitamin D deficiency    takes Vit D    Current Outpatient Medications  Medication Sig Dispense Refill  . amiodarone (PACERONE) 200 MG tablet Take 1 tablet (200 mg total) by mouth daily. 90 tablet 3  . amoxicillin (AMOXIL) 500 MG capsule Take 2,000 mg by mouth See admin instructions. Take 2000 mg by mouth 1 hour prior to dental appointment  5  . atorvastatin (LIPITOR) 40 MG tablet TAKE 1 TABLET BY MOUTH EVERYDAY AT BEDTIME 90 tablet 1  . Cholecalciferol (VITAMIN D3) 2000 units capsule Take 2,000 Units by mouth daily.    . clotrimazole-betamethasone (LOTRISONE) cream Apply 1 application topically 2 (two) times daily. 30 g 0  . fexofenadine (ALLEGRA) 180 MG tablet Take 180 mg by mouth daily as needed for allergies or rhinitis.    . fluticasone (FLONASE) 50 MCG/ACT nasal spray Place 1 spray into both nostrils daily as needed for allergies.     . furosemide (LASIX) 20 MG tablet Take 1 tablet (20 mg  total) by mouth 2 (two) times daily. 180 tablet 3  . glucose blood test strip Use to check CBG AC and QHS. 200 each 12  . KLOR-CON M20 20 MEQ tablet Take 1 tablet (20 mEq total) by mouth daily. 90 tablet 3  . letrozole (FEMARA) 2.5 MG tablet Take 1 tablet (2.5 mg total) by mouth daily. 90 tablet 3  . losartan (COZAAR) 25 MG tablet TAKE 1 TABLET BY MOUTH TWICE A DAY 180 tablet 3  . metFORMIN (GLUCOPHAGE) 500  MG tablet TAKE 1 TABLET TWICE A DAY WITH A MEAL 180 tablet 1  . nebivolol (BYSTOLIC) 2.5 MG tablet Take 1 tablet (2.5 mg total) by mouth daily. 90 tablet 3  . spironolactone (ALDACTONE) 25 MG tablet Take 1 tablet (25 mg total) by mouth daily. 90 tablet 3  . traMADol (ULTRAM) 50 MG tablet Take 1-2 tablets (50-100 mg total) by mouth every 6 (six) hours as needed. (Patient taking differently: Take 50-100 mg by mouth every 6 (six) hours as needed for moderate pain. ) 20 tablet 0  . XARELTO 20 MG TABS tablet TAKE 1 TABLET EVERY DAY WITH SUPPER 30 tablet 6   No current facility-administered medications for this encounter.     Allergies  Allergen Reactions  . Tikosyn [Dofetilide]     Prolonged QT/QTc  . Ambien [Zolpidem] Other (See Comments)    Hallucinations, up walking around  . Codeine Phosphate Nausea And Vomiting  . Medrol [Methylprednisolone] Other (See Comments)    Felt really weird w the high dose oral steroid, but tolerates low doses or oral steroid      Social History   Socioeconomic History  . Marital status: Widowed    Spouse name: Not on file  . Number of children: 2  . Years of education: Not on file  . Highest education level: Not on file  Occupational History  . Occupation: retired  Scientific laboratory technician  . Financial resource strain: Not on file  . Food insecurity:    Worry: Not on file    Inability: Not on file  . Transportation needs:    Medical: Not on file    Non-medical: Not on file  Tobacco Use  . Smoking status: Former Smoker    Packs/day: 1.50    Years: 35.00     Pack years: 52.50    Types: Cigarettes    Last attempt to quit: 08/24/2001    Years since quitting: 16.8  . Smokeless tobacco: Never Used  Substance and Sexual Activity  . Alcohol use: No  . Drug use: No  . Sexual activity: Never    Birth control/protection: Post-menopausal  Lifestyle  . Physical activity:    Days per week: Not on file    Minutes per session: Not on file  . Stress: Not on file  Relationships  . Social connections:    Talks on phone: Not on file    Gets together: Not on file    Attends religious service: Not on file    Active member of club or organization: Not on file    Attends meetings of clubs or organizations: Not on file    Relationship status: Not on file  . Intimate partner violence:    Fear of current or ex partner: Not on file    Emotionally abused: Not on file    Physically abused: Not on file    Forced sexual activity: Not on file  Other Topics Concern  . Not on file  Social History Narrative  . Not on file      Family History  Problem Relation Age of Onset  . Dementia Sister   . Diabetes Sister   . Alzheimer's disease Sister   . Diabetes Brother        x 2  . Heart disease Brother   . Irritable bowel syndrome Brother   . Liver cancer Mother   . Diabetes Mother   . Pancreatic cancer Mother   . Diabetes Father   . Heart disease Father  Vitals:   06/15/18 1135  BP: (!) 100/52  Pulse: 65  SpO2: 97%  Weight: 108.8 kg (239 lb 12.8 oz)     PHYSICAL EXAM: General:  Well appearing. No respiratory difficulty HEENT: normal anicteric Neck: supple. no JVD. Carotids 2+ bilat; no bruits. No lymphadenopathy or thyromegaly appreciated. Cor: PMI nondisplaced. Regular rate & rhythm. No rubs, gallops or murmurs. Lungs: clear no wheeze Abdomen: obese soft, nontender, nondistended. No hepatosplenomegaly. No bruits or masses. Good bowel sounds. Extremities: no cyanosis, clubbing, rash, edema Neuro: alert & oriented x 3, cranial nerves  grossly intact. moves all 4 extremities w/o difficulty. Affect pleasant.  ECG: SB 54. Narrow QRS Personally reviewe   ASSESSMENT & PLAN:  1. Chronic systolic CHF, NICM - Suspect tachy-mediated. EF has already improved with control of her Afib - Echo 03/03/2018 LVEF 25%, mild LAE, mild MR, PA peak pressure 29 mm Hg.  - EF improved to 41% on Myoview 03/2018. - NYHA III symptoms.  - Volume status stable on exam.   - Continue lasix 20 mg BID - Continue losartan 25 mg BID - Continue spironolactone 25 mg daily.  - Continue nebivolol 2.5 mg daily.  2. Persistant Afib - EKG today shows Sinus Brady 54 bpm with occasional PVCs.  - Continue amiodarone per EP. - She is on Xarelto 20 mg daily.  3. HTN - Controlled on current meds 4. Obesity - Body mass index is 38.7 kg/m.  5. Severe OSA on CPAP - AHI 47 Has CPAP titration 06/23/18 6. COPD - No wheeze.  - Stopped smoking 08/24/2001 7. H/o breast cancer treated with chemo/radiation 2002 - With recurrence and mastectomy in January 2019 - Doesn't remember ever getting Adriamycin  Shirley Friar, PA-C 06/15/18   Patient seen and examined with the above-signed Advanced Practice Provider and/or Housestaff. I personally reviewed laboratory data, imaging studies and relevant notes. I independently examined the patient and formulated the important aspects of the plan. I have edited the note to reflect any of my changes or salient points. I have personally discussed the plan with the patient and/or family.  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 Cannot exclude component of remote of chemotoxicity or ischemia - though cath normal in 2015 and recent Myoview ok. EF seems to be improving with control of AF - with EF 41% on Myoview. She is struggling with profound fatigue and thinks it may be related to [facility] however OSA likely playing a role. We discussed options: 1) continue amio or 2) ablation. Doubt ablation will be  very succesful with untreated OSA. Would treat OSA first and then re-evaluate. She agrees  If still fatigued in 2-3 months of CPAP can reconsider ablation, Recheck echo to reassess EF. Continue current med regimen. Volume status ok. EF 41% too high for Entresto currently. Await echo results.   Glori Bickers, MD  12:48 PM

## 2018-06-15 NOTE — Patient Instructions (Signed)
Your physician has requested that you have an echocardiogram. Echocardiography is a painless test that uses sound waves to create images of your heart. It provides your doctor with information about the size and shape of your heart and how well your heart's chambers and valves are working. This procedure takes approximately one hour. There are no restrictions for this procedure.   [place], THEY WILL CALL YOU TO SCHEDULE  Your physician recommends that you schedule a follow-up appointment in: 2-3 months

## 2018-06-19 DIAGNOSIS — C50912 Malignant neoplasm of unspecified site of left female breast: Secondary | ICD-10-CM | POA: Diagnosis not present

## 2018-06-20 ENCOUNTER — Encounter: Payer: Self-pay | Admitting: Internal Medicine

## 2018-06-20 ENCOUNTER — Ambulatory Visit (INDEPENDENT_AMBULATORY_CARE_PROVIDER_SITE_OTHER): Payer: Medicare Other | Admitting: Internal Medicine

## 2018-06-20 VITALS — BP 100/70 | HR 60 | Temp 97.7°F | Wt 237.2 lb

## 2018-06-20 DIAGNOSIS — S76011A Strain of muscle, fascia and tendon of right hip, initial encounter: Secondary | ICD-10-CM

## 2018-06-20 MED ORDER — HYDROCODONE-ACETAMINOPHEN 5-325 MG PO TABS: 1.0000 | ORAL_TABLET | Freq: Four times a day (QID) | ORAL | 0 refills | Status: AC | PRN

## 2018-06-20 NOTE — Progress Notes (Signed)
Subjective:    Patient ID: Cynthia Herrera, female    DOB: 1945-12-14, 72 y.o.   MRN: [id number]  HPI  72 year old patient who has a history of dilated cardiomyopathy and paroxysmal atrial fibrillation.  She has been undergoing cardiac rehab at the [facility] for the past 4 to 5 weeks.  Over the past 3 weeks she has developed worsening right hip discomfort.  She also has had some right knee discomfort as well. Pain is alleviated by rest and aggravated by standing and walking.  She also gets relief when she sleeps on her right side with pressure over the right hip area. She has used tramadol without benefit.  She is on chronic anticoagulation due to atrial fibrillation and has avoided anti-inflammatory medications  Past Medical History:  Diagnosis Date  . Arthritis   . Breast cancer, left breast ([place]) 2002   "then treated w/chemo and radiation"  . Breast cancer, left breast ([place]) 07/14/2017   "tx'd w/mastectomy"  . Chronic back pain    h/o lumbar stenosis; "no problem since my OR" (`10/27/2017)  . Chronic systolic CHF (congestive heart failure) (HCC)    takes Furosemide and Aldactone daily  . Complication of anesthesia    last surgery iv med when going to sleep"burned as injected"  . COPD (chronic obstructive pulmonary disease) (HCC)    no inhalers   . DCM (dilated cardiomyopathy) ([place])    EF 15-20% ? tachycardia induced - EF 50-55% on echo 2015  . Dyspnea    with exertion  . Dysrhythmia    afib  . Gallstones   . GERD (gastroesophageal reflux disease)    once in a while;depends on what she eats  . History of bronchitis    "chronic when I smoked; no problem since I quit in 2002" (10/27/2017)  . Hyperlipidemia   . Hypertension    takes Losartan and Metoprolol.   . Joint pain   . Muscle spasm    takes Flexeril daily as needed   . Nocturia   . Osteopenia   . Persistent atrial fibrillation ([place])    s/p TEE DCCV and repeat DCCV 11/14/2013  . Personal history of chemotherapy   .  Personal history of colonic polyps    adenomas 03 and 08  . Personal history of radiation therapy   . Pneumonia    hx  . Seasonal allergies   . Thyroid nodule   . Type II diabetes mellitus ([place])   . Vitamin D deficiency    takes Vit D     Social History   Socioeconomic History  . Marital status: Widowed    Spouse name: Not on file  . Number of children: 2  . Years of education: Not on file  . Highest education level: Not on file  Occupational History  . Occupation: retired  Scientific laboratory technician  . Financial resource strain: Not on file  . Food insecurity:    Worry: Not on file    Inability: Not on file  . Transportation needs:    Medical: Not on file    Non-medical: Not on file  Tobacco Use  . Smoking status: Former Smoker    Packs/day: 1.50    Years: 35.00    Pack years: 52.50    Types: Cigarettes    Last attempt to quit: 08/24/2001    Years since quitting: 16.8  . Smokeless tobacco: Never Used  Substance and Sexual Activity  . Alcohol use: No  . Drug use: No  .  Sexual activity: Never    Birth control/protection: Post-menopausal  Lifestyle  . Physical activity:    Days per week: Not on file    Minutes per session: Not on file  . Stress: Not on file  Relationships  . Social connections:    Talks on phone: Not on file    Gets together: Not on file    Attends religious service: Not on file    Active member of club or organization: Not on file    Attends meetings of clubs or organizations: Not on file    Relationship status: Not on file  . Intimate partner violence:    Fear of current or ex partner: Not on file    Emotionally abused: Not on file    Physically abused: Not on file    Forced sexual activity: Not on file  Other Topics Concern  . Not on file  Social History Narrative  . Not on file    Past Surgical History:  Procedure Laterality Date  . AXILLARY LYMPH NODE DISSECTION Left 10/2001   persistent intramammary node Archie Endo 03/09/2011  . BACK SURGERY     . BREAST BIOPSY Left 2002  . BREAST BIOPSY Right 06/2017   "node bx was negative"  . BREAST BIOPSY Left 06/2017   "positive for cancer"  . BREAST LUMPECTOMY WITH RADIOACTIVE SEED LOCALIZATION Right 08/15/2017   Procedure: RIGHT BREAST LUMPECTOMY WITH RADIOACTIVE SEED LOCALIZATION;  Surgeon: Jovita Kussmaul, MD;  Location: [facility];  Service: General;  Laterality: Right;  . CARDIAC CATHETERIZATION  2015  . CARDIOVERSION N/A 11/12/2013   Procedure: CARDIOVERSION;  Surgeon: Thayer Headings, MD;  Location: [facility] ENDOSCOPY;  Service: Cardiovascular;  Laterality: N/A;  10:08  Dr. Marissa Nestle, anesthesia present, [place]   60mg ,  propofol 50mg , IV for elective cardioversion....Dr. Cathie Olden delievered synch 120 joules with successful cardioversion to NSR  . CARDIOVERSION N/A 11/12/2013   Procedure: CARDIOVERSION;  Surgeon: Thayer Headings, MD;  Location: [place];  Service: Cardiovascular;  Laterality: N/A;  . CARDIOVERSION N/A 11/14/2013   Procedure: CARDIOVERSION (BEDSIDE);  Surgeon: Larey Dresser, MD;  Location: [facility] [facility];  Service: Cardiovascular;  Laterality: N/A;  . CARDIOVERSION N/A 02/22/2018   Procedure: CARDIOVERSION;  Surgeon: Sueanne Margarita, MD;  Location: [facility] ENDOSCOPY;  Service: Cardiovascular;  Laterality: N/A;  . CATARACT EXTRACTION W/ INTRAOCULAR LENS  IMPLANT, BILATERAL Bilateral ~ 2016  . COLONOSCOPY  2003, September 2008, April 01, 2011   adenomas 03 and 08, polyp 12, diverticulosis  . COLONOSCOPY WITH PROPOFOL N/A 11/16/2016   Procedure: COLONOSCOPY WITH PROPOFOL;  Surgeon: Gatha Mayer, MD;  Location: [facility] ENDOSCOPY;  Service: Endoscopy;  Laterality: N/A;  . JOINT REPLACEMENT    . LAPAROSCOPIC CHOLECYSTECTOMY  2004  . LEFT AND RIGHT HEART CATHETERIZATION WITH CORONARY ANGIOGRAM N/A 02/01/2014   Procedure: LEFT AND RIGHT HEART CATHETERIZATION WITH CORONARY ANGIOGRAM;  Surgeon: Sueanne Margarita, MD;  Location: [facility] CATH LAB;  Service: Cardiovascular;  Laterality: N/A;  . LUMBAR  LAMINECTOMY/DECOMPRESSION MICRODISCECTOMY N/A 03/19/2014   Procedure: LUMBAR LAMINECTOMY/DECOMPRESSION MICRODISCECTOMY 4 LEVEL;  Surgeon: Kristeen Miss, MD;  Location: [place] NEURO ORS;  Service: Neurosurgery;  Laterality: N/A;  L1-2 L2-3 L3-4 L4-5 Laminectomy/Foraminotomy  . MASS EXCISION Left 08/15/2017   Procedure: EXCISION OF LEFT BREAST MASS;  Surgeon: Jovita Kussmaul, MD;  Location: [facility];  Service: General;  Laterality: Left;  Marland Kitchen MASTECTOMY Left 10/27/2017  . MASTECTOMY PARTIAL / LUMPECTOMY W/ AXILLARY LYMPHADENECTOMY  05/08/2001   Archie Endo 03/09/2011  . PORT-A-CATH REMOVAL  2003  . PORTA CATH INSERTION  2002  . TEE WITHOUT CARDIOVERSION N/A 11/12/2013   Procedure: TRANSESOPHAGEAL ECHOCARDIOGRAM (TEE);  Surgeon: Thayer Headings, MD;  Location: [facility];  Service: Cardiovascular;  Laterality: N/A;  pt b/p low, pt buccal membranes very dry, lips scapped, pt c/o thirst. NPO since MN and iv FLUIDS TOTAL INFUSING AT TOTAL 20ML HR....  Dr. Cathie Olden order allow NS to bolus during procedure....very dry,NS bolus 250 ml total..pt responding well to meds..  . TOTAL KNEE ARTHROPLASTY Left 2006  . TOTAL KNEE ARTHROPLASTY Right 05/10/2016   Procedure: RIGHT TOTAL KNEE ARTHROPLASTY;  Surgeon: Gaynelle Arabian, MD;  Location: [facility] ORS;  Service: Orthopedics;  Laterality: Right;  With adductor block  . TOTAL MASTECTOMY Left 10/27/2017   Procedure: LEFT MASTECTOMY;  Surgeon: Jovita Kussmaul, MD;  Location: [place];  Service: General;  Laterality: Left;  . TUBAL LIGATION      Family History  Problem Relation Age of Onset  . Dementia Sister   . Diabetes Sister   . Alzheimer's disease Sister   . Diabetes Brother        x 2  . Heart disease Brother   . Irritable bowel syndrome Brother   . Liver cancer Mother   . Diabetes Mother   . Pancreatic cancer Mother   . Diabetes Father   . Heart disease Father     Allergies  Allergen Reactions  . Tikosyn [Dofetilide]     Prolonged QT/QTc  . Ambien [Zolpidem] Other (See  Comments)    Hallucinations, up walking around  . Codeine Phosphate Nausea And Vomiting  . Medrol [Methylprednisolone] Other (See Comments)    Felt really weird w the high dose oral steroid, but tolerates low doses or oral steroid    Current Outpatient Medications on File Prior to Visit  Medication Sig Dispense Refill  . amiodarone (PACERONE) 200 MG tablet Take 1 tablet (200 mg total) by mouth daily. 90 tablet 3  . amoxicillin (AMOXIL) 500 MG capsule Take 2,000 mg by mouth See admin instructions. Take 2000 mg by mouth 1 hour prior to dental appointment  5  . atorvastatin (LIPITOR) 40 MG tablet TAKE 1 TABLET BY MOUTH EVERYDAY AT BEDTIME 90 tablet 1  . Cholecalciferol (VITAMIN D3) 2000 units capsule Take 2,000 Units by mouth daily.    . clotrimazole-betamethasone (LOTRISONE) cream Apply 1 application topically 2 (two) times daily. 30 g 0  . fexofenadine (ALLEGRA) 180 MG tablet Take 180 mg by mouth daily as needed for allergies or rhinitis.    . fluticasone (FLONASE) 50 MCG/ACT nasal spray Place 1 spray into both nostrils daily as needed for allergies.     . furosemide (LASIX) 20 MG tablet Take 1 tablet (20 mg total) by mouth 2 (two) times daily. 180 tablet 3  . glucose blood test strip Use to check CBG AC and QHS. 200 each 12  . KLOR-CON M20 20 MEQ tablet Take 1 tablet (20 mEq total) by mouth daily. 90 tablet 3  . letrozole (FEMARA) 2.5 MG tablet Take 1 tablet (2.5 mg total) by mouth daily. 90 tablet 3  . losartan (COZAAR) 25 MG tablet TAKE 1 TABLET BY MOUTH TWICE A DAY 180 tablet 3  . metFORMIN (GLUCOPHAGE) 500 MG tablet TAKE 1 TABLET TWICE A DAY WITH A MEAL 180 tablet 1  . nebivolol (BYSTOLIC) 2.5 MG tablet Take 1 tablet (2.5 mg total) by mouth daily. 90 tablet 3  . spironolactone (ALDACTONE) 25 MG tablet Take 1 tablet (25  mg total) by mouth daily. 90 tablet 3  . traMADol (ULTRAM) 50 MG tablet Take 1-2 tablets (50-100 mg total) by mouth every 6 (six) hours as needed. (Patient taking  differently: Take 50-100 mg by mouth every 6 (six) hours as needed for moderate pain. ) 20 tablet 0  . XARELTO 20 MG TABS tablet TAKE 1 TABLET EVERY DAY WITH SUPPER 30 tablet 6   No current facility-administered medications on file prior to visit.     BP 100/70 (BP Location: Right Arm, Patient Position: Sitting, Cuff Size: Large)   Pulse 60   Temp 97.7 F (36.5 C) (Oral)   Wt 237 lb 3.2 oz (107.6 kg)   SpO2 100%   BMI 38.29 kg/m     Review of Systems  Musculoskeletal:       Right hip and right knee pain of 3 weeks duration       Objective:   Physical Exam  Constitutional: She appears well-developed and well-nourished. No distress.  Musculoskeletal:  Fairly full range of motion of the right hip.  Some discomfort with external rotation.  No tenderness over the greater trochanter area          Assessment & Plan:   Right hip pain.  Suspect overuse issue.  Patient will moderate her activities Tramadol has not been effective.  Patient was given a prescription for hydrocodone which she states she can take at low dose with food without difficulty Dilated cardiomyopathy.  Patient scheduled for follow-up 2D echocardiogram tomorrow.  Cardiology follow-up as scheduled  Marletta Lor

## 2018-06-20 NOTE — Patient Instructions (Signed)
Muscle Strain A muscle strain is an injury that occurs when a muscle is stretched beyond its normal length. Usually a small number of muscle fibers are torn when this happens. Muscle strain is rated in degrees. First-degree strains have the least amount of muscle fiber tearing and pain. Second-degree and third-degree strains have increasingly more tearing and pain. Usually, recovery from muscle strain takes 1-2 weeks. Complete healing takes 5-6 weeks. What are the causes? Muscle strain happens when a sudden, violent force placed on a muscle stretches it too far. This may occur with lifting, sports, or a fall. What increases the risk? Muscle strain is especially common in athletes. What are the signs or symptoms? At the site of the muscle strain, there may be:  Pain.  Bruising.  Swelling.  Difficulty using the muscle due to pain or lack of normal function.  How is this diagnosed? Your health care provider will perform a physical exam and ask about your medical history. How is this treated? Often, the best treatment for a muscle strain is resting, icing, and applying cold compresses to the injured area. Follow these instructions at home:  Use the PRICE method of treatment to promote muscle healing during the first 2-3 days after your injury. The PRICE method involves: ? Protecting the muscle from being injured again. ? Restricting your activity and resting the injured body part. ? Icing your injury. To do this, put ice in a plastic bag. Place a towel between your skin and the bag. Then, apply the ice and leave it on from 15-20 minutes each hour. After the third day, switch to moist heat packs. ? Apply compression to the injured area with a splint or elastic bandage. Be careful not to wrap it too tightly. This may interfere with blood circulation or increase swelling. ? Elevate the injured body part above the level of your heart as often as you can.  Only take over-the-counter or  prescription medicines for pain, discomfort, or fever as directed by your health care provider.  Warming up prior to exercise helps to prevent future muscle strains. Contact a health care provider if:  You have increasing pain or swelling in the injured area.  You have numbness, tingling, or a significant loss of strength in the injured area. This information is not intended to replace advice given to you by your health care provider. Make sure you discuss any questions you have with your health care provider. Document Released: 10/11/2005 Document Revised: 03/18/2016 Document Reviewed: 05/10/2013 Elsevier Interactive Patient Education  2017 [employer]  

## 2018-06-21 ENCOUNTER — Ambulatory Visit (HOSPITAL_COMMUNITY): Payer: Medicare Other | Attending: Cardiology

## 2018-06-21 ENCOUNTER — Other Ambulatory Visit: Payer: Self-pay

## 2018-06-21 DIAGNOSIS — I340 Nonrheumatic mitral (valve) insufficiency: Secondary | ICD-10-CM | POA: Insufficient documentation

## 2018-06-21 DIAGNOSIS — I110 Hypertensive heart disease with heart failure: Secondary | ICD-10-CM | POA: Insufficient documentation

## 2018-06-21 DIAGNOSIS — G8929 Other chronic pain: Secondary | ICD-10-CM | POA: Insufficient documentation

## 2018-06-21 DIAGNOSIS — I4891 Unspecified atrial fibrillation: Secondary | ICD-10-CM | POA: Diagnosis not present

## 2018-06-21 DIAGNOSIS — M549 Dorsalgia, unspecified: Secondary | ICD-10-CM | POA: Insufficient documentation

## 2018-06-21 DIAGNOSIS — G4733 Obstructive sleep apnea (adult) (pediatric): Secondary | ICD-10-CM | POA: Insufficient documentation

## 2018-06-21 DIAGNOSIS — J449 Chronic obstructive pulmonary disease, unspecified: Secondary | ICD-10-CM | POA: Diagnosis not present

## 2018-06-21 DIAGNOSIS — E119 Type 2 diabetes mellitus without complications: Secondary | ICD-10-CM | POA: Diagnosis not present

## 2018-06-21 DIAGNOSIS — I5042 Chronic combined systolic (congestive) and diastolic (congestive) heart failure: Secondary | ICD-10-CM

## 2018-06-21 DIAGNOSIS — E785 Hyperlipidemia, unspecified: Secondary | ICD-10-CM | POA: Diagnosis not present

## 2018-06-23 ENCOUNTER — Encounter (HOSPITAL_BASED_OUTPATIENT_CLINIC_OR_DEPARTMENT_OTHER): Payer: Medicare Other

## 2018-06-23 NOTE — Progress Notes (Signed)
[facility] PREP Weekly Session   Patient Details  Name: Cynthia Herrera MRN: [id number] Date of Birth: Oct 19, 1946 Age: 72 y.o. PCP: Marletta Lor, MD  Vitals:   06/13/18 1406  Weight: 240 lb (108.9 kg)    [facility] Weekly seesion - 06/23/18 1400      Weekly Session   Minutes exercised this week  20 minutes   cardio     Fun things you did since last meeting:"out of town" Things you are grateful for:"to be alive"  Vanita Ingles 06/23/2018, 2:06 PM

## 2018-06-28 ENCOUNTER — Telehealth: Payer: Self-pay | Admitting: Family Medicine

## 2018-06-28 DIAGNOSIS — M549 Dorsalgia, unspecified: Secondary | ICD-10-CM

## 2018-06-28 DIAGNOSIS — M25559 Pain in unspecified hip: Secondary | ICD-10-CM

## 2018-06-28 NOTE — Telephone Encounter (Signed)
Please advise 

## 2018-06-28 NOTE — Telephone Encounter (Signed)
Please schedule orthopedic consultation

## 2018-06-28 NOTE — Telephone Encounter (Signed)
Copied from [place] [id number]. Topic: Referral - Request >> Jun 28, 2018  3:03 PM Scherrie Gerlach wrote: Reason for CRM: pt states she saw Dr Raliegh Ip 8/27 and is still having pain in leg and back.  Pt states for sure in center of bottom and hip. Pt thinks she needs to have a scan or MRI. Please advise.

## 2018-06-29 NOTE — Telephone Encounter (Signed)
Ortho in place! PT notified. No further action needed!

## 2018-07-04 ENCOUNTER — Ambulatory Visit (INDEPENDENT_AMBULATORY_CARE_PROVIDER_SITE_OTHER): Payer: Medicare Other | Admitting: Internal Medicine

## 2018-07-04 ENCOUNTER — Encounter: Payer: Self-pay | Admitting: Internal Medicine

## 2018-07-04 VITALS — BP 100/80 | HR 54 | Temp 97.9°F | Wt 234.6 lb

## 2018-07-04 DIAGNOSIS — I420 Dilated cardiomyopathy: Secondary | ICD-10-CM

## 2018-07-04 DIAGNOSIS — I70209 Unspecified atherosclerosis of native arteries of extremities, unspecified extremity: Secondary | ICD-10-CM | POA: Diagnosis not present

## 2018-07-04 DIAGNOSIS — E1151 Type 2 diabetes mellitus with diabetic peripheral angiopathy without gangrene: Secondary | ICD-10-CM | POA: Diagnosis not present

## 2018-07-04 DIAGNOSIS — I10 Essential (primary) hypertension: Secondary | ICD-10-CM

## 2018-07-04 DIAGNOSIS — E785 Hyperlipidemia, unspecified: Secondary | ICD-10-CM

## 2018-07-04 LAB — POCT GLYCOSYLATED HEMOGLOBIN (HGB A1C): HEMOGLOBIN A1C: 5.2 % (ref 4.0–5.6)

## 2018-07-04 NOTE — Patient Instructions (Addendum)
Limit your sodium (Salt) intake   Please check your hemoglobin A1c every 3-6 months  Orthopedic follow-up as scheduled  Cardiology follow-up as scheduled

## 2018-07-04 NOTE — Progress Notes (Signed)
Subjective:    Patient ID: Cynthia Herrera, female    DOB: 10/27/1945, 72 y.o.   MRN: [id number]  HPI  Lab Results  Component Value Date   HGBA1C 6.2 (H) 01/02/2018    72 year old patient who is seen today for follow-up of essential hypertension and type 2 diabetes  Lab Results  Component Value Date   HGBA1C 5.2 07/04/2018    She is doing quite well although still having some right hip and right knee pain.  She has orthopedic follow-up scheduled for next week.  She also has a history of spinal stenosis and has follow-up scheduled with Dr. Ellene Route next month  Otherwise doing well.  She is followed by cardiology with a history of dilated cardiomyopathy.  Denies any pulmonary complaints.  She is followed by oncology with history of breast cancer.  Past Medical History:  Diagnosis Date  . Arthritis   . Breast cancer, left breast ([place]) 2002   "then treated w/chemo and radiation"  . Breast cancer, left breast ([place]) 07/14/2017   "tx'd w/mastectomy"  . Chronic back pain    h/o lumbar stenosis; "no problem since my OR" (`10/27/2017)  . Chronic systolic CHF (congestive heart failure) (HCC)    takes Furosemide and Aldactone daily  . Complication of anesthesia    last surgery iv med when going to sleep"burned as injected"  . COPD (chronic obstructive pulmonary disease) (HCC)    no inhalers   . DCM (dilated cardiomyopathy) ([place])    EF 15-20% ? tachycardia induced - EF 50-55% on echo 2015  . Dyspnea    with exertion  . Dysrhythmia    afib  . Gallstones   . GERD (gastroesophageal reflux disease)    once in a while;depends on what she eats  . History of bronchitis    "chronic when I smoked; no problem since I quit in 2002" (10/27/2017)  . Hyperlipidemia   . Hypertension    takes Losartan and Metoprolol.   . Joint pain   . Muscle spasm    takes Flexeril daily as needed   . Nocturia   . Osteopenia   . Persistent atrial fibrillation ([place])    s/p TEE DCCV and repeat DCCV  11/14/2013  . Personal history of chemotherapy   . Personal history of colonic polyps    adenomas 03 and 08  . Personal history of radiation therapy   . Pneumonia    hx  . Seasonal allergies   . Thyroid nodule   . Type II diabetes mellitus ([place])   . Vitamin D deficiency    takes Vit D     Social History   Socioeconomic History  . Marital status: Widowed    Spouse name: Not on file  . Number of children: 2  . Years of education: Not on file  . Highest education level: Not on file  Occupational History  . Occupation: retired  Scientific laboratory technician  . Financial resource strain: Not on file  . Food insecurity:    Worry: Not on file    Inability: Not on file  . Transportation needs:    Medical: Not on file    Non-medical: Not on file  Tobacco Use  . Smoking status: Former Smoker    Packs/day: 1.50    Years: 35.00    Pack years: 52.50    Types: Cigarettes    Last attempt to quit: 08/24/2001    Years since quitting: 16.8  . Smokeless tobacco: Never Used  Substance and  Sexual Activity  . Alcohol use: No  . Drug use: No  . Sexual activity: Never    Birth control/protection: Post-menopausal  Lifestyle  . Physical activity:    Days per week: Not on file    Minutes per session: Not on file  . Stress: Not on file  Relationships  . Social connections:    Talks on phone: Not on file    Gets together: Not on file    Attends religious service: Not on file    Active member of club or organization: Not on file    Attends meetings of clubs or organizations: Not on file    Relationship status: Not on file  . Intimate partner violence:    Fear of current or ex partner: Not on file    Emotionally abused: Not on file    Physically abused: Not on file    Forced sexual activity: Not on file  Other Topics Concern  . Not on file  Social History Narrative  . Not on file    Past Surgical History:  Procedure Laterality Date  . AXILLARY LYMPH NODE DISSECTION Left 10/2001   persistent  intramammary node Archie Endo 03/09/2011  . BACK SURGERY    . BREAST BIOPSY Left 2002  . BREAST BIOPSY Right 06/2017   "node bx was negative"  . BREAST BIOPSY Left 06/2017   "positive for cancer"  . BREAST LUMPECTOMY WITH RADIOACTIVE SEED LOCALIZATION Right 08/15/2017   Procedure: RIGHT BREAST LUMPECTOMY WITH RADIOACTIVE SEED LOCALIZATION;  Surgeon: Jovita Kussmaul, MD;  Location: [place];  Service: General;  Laterality: Right;  . CARDIAC CATHETERIZATION  2015  . CARDIOVERSION N/A 11/12/2013   Procedure: CARDIOVERSION;  Surgeon: Thayer Headings, MD;  Location: [facility] ENDOSCOPY;  Service: Cardiovascular;  Laterality: N/A;  10:08  Dr. Marissa Nestle, anesthesia present, Lido   60mg ,  propofol 50mg , IV for elective cardioversion....Dr. Cathie Olden delievered synch 120 joules with successful cardioversion to NSR  . CARDIOVERSION N/A 11/12/2013   Procedure: CARDIOVERSION;  Surgeon: Thayer Headings, MD;  Location: [place];  Service: Cardiovascular;  Laterality: N/A;  . CARDIOVERSION N/A 11/14/2013   Procedure: CARDIOVERSION (BEDSIDE);  Surgeon: Larey Dresser, MD;  Location: [place];  Service: Cardiovascular;  Laterality: N/A;  . CARDIOVERSION N/A 02/22/2018   Procedure: CARDIOVERSION;  Surgeon: Sueanne Margarita, MD;  Location: [facility] ENDOSCOPY;  Service: Cardiovascular;  Laterality: N/A;  . CATARACT EXTRACTION W/ INTRAOCULAR LENS  IMPLANT, BILATERAL Bilateral ~ 2016  . COLONOSCOPY  2003, September 2008, April 01, 2011   adenomas 03 and 08, polyp 12, diverticulosis  . COLONOSCOPY WITH PROPOFOL N/A 11/16/2016   Procedure: COLONOSCOPY WITH PROPOFOL;  Surgeon: Gatha Mayer, MD;  Location: [facility] ENDOSCOPY;  Service: Endoscopy;  Laterality: N/A;  . JOINT REPLACEMENT    . LAPAROSCOPIC CHOLECYSTECTOMY  2004  . LEFT AND RIGHT HEART CATHETERIZATION WITH CORONARY ANGIOGRAM N/A 02/01/2014   Procedure: LEFT AND RIGHT HEART CATHETERIZATION WITH CORONARY ANGIOGRAM;  Surgeon: Sueanne Margarita, MD;  Location: [place] CATH LAB;  Service: Cardiovascular;   Laterality: N/A;  . LUMBAR LAMINECTOMY/DECOMPRESSION MICRODISCECTOMY N/A 03/19/2014   Procedure: LUMBAR LAMINECTOMY/DECOMPRESSION MICRODISCECTOMY 4 LEVEL;  Surgeon: Kristeen Miss, MD;  Location: [place] NEURO ORS;  Service: Neurosurgery;  Laterality: N/A;  L1-2 L2-3 L3-4 L4-5 Laminectomy/Foraminotomy  . MASS EXCISION Left 08/15/2017   Procedure: EXCISION OF LEFT BREAST MASS;  Surgeon: Jovita Kussmaul, MD;  Location: [place];  Service: General;  Laterality: Left;  Marland Kitchen MASTECTOMY Left 10/27/2017  . MASTECTOMY PARTIAL /  LUMPECTOMY W/ AXILLARY LYMPHADENECTOMY  05/08/2001   Archie Endo 03/09/2011  . PORT-A-CATH REMOVAL  2003  . PORTA CATH INSERTION  2002  . TEE WITHOUT CARDIOVERSION N/A 11/12/2013   Procedure: TRANSESOPHAGEAL ECHOCARDIOGRAM (TEE);  Surgeon: Thayer Headings, MD;  Location: [place];  Service: Cardiovascular;  Laterality: N/A;  pt b/p low, pt buccal membranes very dry, lips scapped, pt c/o thirst. NPO since MN and iv FLUIDS TOTAL INFUSING AT TOTAL 20ML HR....  Dr. Cathie Olden order allow NS to bolus during procedure....very dry,NS bolus 250 ml total..pt responding well to meds..  . TOTAL KNEE ARTHROPLASTY Left 2006  . TOTAL KNEE ARTHROPLASTY Right 05/10/2016   Procedure: RIGHT TOTAL KNEE ARTHROPLASTY;  Surgeon: Gaynelle Arabian, MD;  Location[facility];  Service: Orthopedics;  Laterality: Right;  With adductor block  . TOTAL MASTECTOMY Left 10/27/2017   Procedure: LEFT MASTECTOMY;  Surgeon: Jovita Kussmaul, MD;  Location: [place];  Service: General;  Laterality: Left;  . TUBAL LIGATION      Family History  Problem Relation Age of Onset  . Dementia Sister   . Diabetes Sister   . Alzheimer's disease Sister   . Diabetes Brother        x 2  . Heart disease Brother   . Irritable bowel syndrome Brother   . Liver cancer Mother   . Diabetes Mother   . Pancreatic cancer Mother   . Diabetes Father   . Heart disease Father     Allergies  Allergen Reactions  . Tikosyn [Dofetilide]     Prolonged QT/QTc  .  Ambien [Zolpidem] Other (See Comments)    Hallucinations, up walking around  . Codeine Phosphate Nausea And Vomiting  . Medrol [Methylprednisolone] Other (See Comments)    Felt really weird w the high dose oral steroid, but tolerates low doses or oral steroid    Current Outpatient Medications on File Prior to Visit  Medication Sig Dispense Refill  . amiodarone (PACERONE) 200 MG tablet Take 1 tablet (200 mg total) by mouth daily. 90 tablet 3  . amoxicillin (AMOXIL) 500 MG capsule Take 2,000 mg by mouth See admin instructions. Take 2000 mg by mouth 1 hour prior to dental appointment  5  . atorvastatin (LIPITOR) 40 MG tablet TAKE 1 TABLET BY MOUTH EVERYDAY AT BEDTIME 90 tablet 1  . Cholecalciferol (VITAMIN D3) 2000 units capsule Take 2,000 Units by mouth daily.    . clotrimazole-betamethasone (LOTRISONE) cream Apply 1 application topically 2 (two) times daily. 30 g 0  . fexofenadine (ALLEGRA) 180 MG tablet Take 180 mg by mouth daily as needed for allergies or rhinitis.    . fluticasone (FLONASE) 50 MCG/ACT nasal spray Place 1 spray into both nostrils daily as needed for allergies.     . furosemide (LASIX) 20 MG tablet Take 1 tablet (20 mg total) by mouth 2 (two) times daily. 180 tablet 3  . glucose blood test strip Use to check CBG AC and QHS. 200 each 12  . KLOR-CON M20 20 MEQ tablet Take 1 tablet (20 mEq total) by mouth daily. 90 tablet 3  . letrozole (FEMARA) 2.5 MG tablet Take 1 tablet (2.5 mg total) by mouth daily. 90 tablet 3  . losartan (COZAAR) 25 MG tablet TAKE 1 TABLET BY MOUTH TWICE A DAY 180 tablet 3  . metFORMIN (GLUCOPHAGE) 500 MG tablet TAKE 1 TABLET TWICE A DAY WITH A MEAL 180 tablet 1  . nebivolol (BYSTOLIC) 2.5 MG tablet Take 1 tablet (2.5 mg total) by mouth  daily. 90 tablet 3  . spironolactone (ALDACTONE) 25 MG tablet Take 1 tablet (25 mg total) by mouth daily. 90 tablet 3  . traMADol (ULTRAM) 50 MG tablet Take 1-2 tablets (50-100 mg total) by mouth every 6 (six) hours as  needed. (Patient taking differently: Take 50-100 mg by mouth every 6 (six) hours as needed for moderate pain. ) 20 tablet 0  . XARELTO 20 MG TABS tablet TAKE 1 TABLET EVERY DAY WITH SUPPER 30 tablet 6   No current facility-administered medications on file prior to visit.     BP 100/80 (BP Location: Right Arm, Patient Position: Sitting, Cuff Size: Large)   Pulse (!) 54   Temp 97.9 F (36.6 C) (Oral)   Wt 234 lb 9.6 oz (106.4 kg)   SpO2 97%   BMI 37.87 kg/m     Review of Systems  Constitutional: Negative.   HENT: Negative for congestion, dental problem, hearing loss, rhinorrhea, sinus pressure, sore throat and tinnitus.   Eyes: Negative for pain, discharge and visual disturbance.  Respiratory: Negative for cough and shortness of breath.   Cardiovascular: Negative for chest pain, palpitations and leg swelling.  Gastrointestinal: Negative for abdominal distention, abdominal pain, blood in stool, constipation, diarrhea, nausea and vomiting.  Genitourinary: Negative for difficulty urinating, dysuria, flank pain, frequency, hematuria, pelvic pain, urgency, vaginal bleeding, vaginal discharge and vaginal pain.  Musculoskeletal: Positive for arthralgias, back pain and gait problem. Negative for joint swelling.  Skin: Negative for rash.  Neurological: Negative for dizziness, syncope, speech difficulty, weakness, numbness and headaches.  Hematological: Negative for adenopathy.  Psychiatric/Behavioral: Negative for agitation, behavioral problems and dysphoric mood. The patient is not nervous/anxious.        Objective:   Physical Exam  Constitutional: She is oriented to person, place, and time. She appears well-developed and well-nourished. No distress.  Blood pressure well controlled Weight 234  HENT:  Head: Normocephalic.  Right Ear: External ear normal.  Left Ear: External ear normal.  Mouth/Throat: Oropharynx is clear and moist.  Eyes: Pupils are equal, round, and reactive to light.  Conjunctivae and EOM are normal.  Neck: Normal range of motion. Neck supple. No thyromegaly present.  Cardiovascular: Normal rate, regular rhythm and normal heart sounds.  Pulmonary/Chest: Effort normal and breath sounds normal.  Abdominal: Soft. Bowel sounds are normal. She exhibits no mass. There is no tenderness.  Musculoskeletal: Normal range of motion.  Lymphadenopathy:    She has no cervical adenopathy.  Neurological: She is alert and oriented to person, place, and time.  Skin: Skin is warm and dry. No rash noted.  Psychiatric: She has a normal mood and affect. Her behavior is normal.          Assessment & Plan:   Diabetes mellitus.  Excellent control.  Continue metformin History of atrial fibrillation.  Continue anticoagulation Essential hypertension stable Combined systolic and diastolic heart failure.  Compensated.  Follow-up cardiology History of breast cancer.  Follow-up oncology  Medications updated Patient will follow up with a new PCP in 4 to 6 months  Marletta Lor

## 2018-07-11 ENCOUNTER — Ambulatory Visit (INDEPENDENT_AMBULATORY_CARE_PROVIDER_SITE_OTHER): Payer: Self-pay

## 2018-07-11 ENCOUNTER — Encounter (INDEPENDENT_AMBULATORY_CARE_PROVIDER_SITE_OTHER): Payer: Self-pay | Admitting: Orthopaedic Surgery

## 2018-07-11 ENCOUNTER — Ambulatory Visit (INDEPENDENT_AMBULATORY_CARE_PROVIDER_SITE_OTHER): Payer: Medicare Other | Admitting: Orthopaedic Surgery

## 2018-07-11 VITALS — BP 146/73 | HR 67 | Ht 66.0 in | Wt 231.0 lb

## 2018-07-11 DIAGNOSIS — M545 Low back pain: Secondary | ICD-10-CM | POA: Diagnosis not present

## 2018-07-11 DIAGNOSIS — M25551 Pain in right hip: Secondary | ICD-10-CM | POA: Diagnosis not present

## 2018-07-11 DIAGNOSIS — M7061 Trochanteric bursitis, right hip: Secondary | ICD-10-CM | POA: Diagnosis not present

## 2018-07-11 NOTE — Progress Notes (Signed)
Office Visit Note   Patient: Cynthia Herrera           Date of Birth: 1946/09/05           MRN: [id number] Visit Date: 07/11/2018              Requested by: Marletta Lor, MD [place],  [id number] PCP: Marletta Lor, MD   Assessment & Plan: Visit Diagnoses:  1. Pain in right hip   2. Trochanteric bursitis, right hip     Plan: Right trochanteric injection performed with good relief.  She can return if she has persistent problems.  Follow-Up Instructions: No follow-ups on file.   Orders:  Orders Placed This Encounter  Procedures  . Large Joint Inj  . XR HIP UNILAT W OR W/O PELVIS 2-3 VIEWS RIGHT  . XR Lumbar Spine 2-3 Views   No orders of the defined types were placed in this encounter.     Procedures: Large Joint Inj on 07/11/2018 4:30 PM Medications: 0.5 mL lidocaine 1 %; 2 mL bupivacaine 0.25 %; 40 mg methylPREDNISolone acetate 40 MG/ML      Clinical Data: No additional findings.   Subjective: Chief Complaint  Patient presents with  . Right Hip - Pain    HPI 72 year old female with right lateral hip pain started 5 weeks ago.  She started getting sore when she is going to the [facility] and doing cardio fitness.  She states is gradually gotten worse the point where now she is limping and she is taking one half of a Norco 5/325 daily as well as muscle relaxant.  Previous lumbar surgery by Dr. Ellene Route in 2015.  This was a 4 level lumbar decompression with microdiscectomy.  Patient is also had total knee arthroplasty on the right by Dr. Posey Pronto which gives her problems.  Left total knee arthroplasty by Dr. Onnie Graham doing well.  Review of Systems positive osteopenia dyslipidemia, COPD, chronic Xarelto, hypertension, breast cancer, lumbar spinal stenosis post decompression, coronary artery disease with calcification seen on CT scan, type 2 diabetes with PAD.  Hypertension, breast cancer, right lateral hip pain.   Objective: Vital  Signs: BP (!) 146/73   Pulse 67   Ht 5\' 6"  (1.676 m)   Wt 231 lb (104.8 kg)   BMI 37.28 kg/m   Physical Exam  Constitutional: She is oriented to person, place, and time. She appears well-developed.  HENT:  Head: Normocephalic.  Right Ear: External ear normal.  Left Ear: External ear normal.  Eyes: Pupils are equal, round, and reactive to light.  Neck: No tracheal deviation present. No thyromegaly present.  Cardiovascular: Normal rate.  Pulmonary/Chest: Effort normal.  Abdominal: Soft.  Neurological: She is alert and oriented to person, place, and time.  Skin: Skin is warm and dry.  Psychiatric: She has a normal mood and affect. Her behavior is normal.    Ortho Exam patient has exquisite tenderness of the right greater trochanter.  No abduction weakness no hip flexion weakness negative logroll to the hips no hip flexion contracture.  Knee and ankle jerk are intact.  She is able to ambulate with only trace Trendelenburg gait.  Opposite left trochanter is nontender minimal sciatic notch tenderness well-healed lumbar incision.  Specialty Comments:  No specialty comments available.  Imaging: No results found.   PMFS History: Patient Active Problem List   Diagnosis Date Noted  . Trochanteric bursitis, right hip 07/14/2018  . Visit for monitoring Tikosyn therapy 03/21/2018  .  Candidiasis of skin 01/05/2018  . Menopausal syndrome 01/05/2018  . Postmenopausal bleeding 01/05/2018  . Postoperative breast asymmetry 12/02/2017  . Recurrent breast cancer, left ([place]) 10/27/2017  . 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, estrogen receptor positive ([place]) 09/19/2017  . History of left breast cancer 09/19/2017  . Malignant neoplasm of central portion of left breast in female, estrogen receptor positive ([place]) 09/01/2017  . Diabetes mellitus with peripheral vascular disease ([place]) 05/03/2016  . Diabetes mellitus with coincident hypertension ([place]) 05/03/2016  . Diabetes  type 2 with atherosclerosis of arteries of extremities ([place]) 11/10/2015  . Excessive daytime sleepiness 03/12/2015  . CAD (coronary artery disease), native coronary artery 04/05/2014  . Lumbar stenosis 03/19/2014  . Persistent atrial fibrillation ([place])   . DCM (dilated cardiomyopathy) ([place])   . Chronic combined systolic and diastolic CHF (congestive heart failure) ([place])   . Chronic anticoagulation- Xarelto 11/16/2013  . Family history of coronary artery disease 11/08/2013  . Coronary artery calcification seen on CAT scan 11/08/2013  . Spinal stenosis of lumbar region 12/18/2012  . BACK PAIN 01/08/2010  . BACK PAIN WITH RADICULOPATHY 02/28/2008  . OSTEOPENIA 05/17/2007  . Dyslipidemia 05/03/2007  . Essential hypertension 05/03/2007  . COPD 05/03/2007  . BREAST CANCER, HX OF 05/03/2007  . History of colonic polyps 05/03/2007   Past Medical History:  Diagnosis Date  . Arthritis   . Breast cancer, left breast ([place]) 2002   "then treated w/chemo and radiation"  . Breast cancer, left breast ([place]) 07/14/2017   "tx'd w/mastectomy"  . Chronic back pain    h/o lumbar stenosis; "no problem since my OR" (`10/27/2017)  . Chronic systolic CHF (congestive heart failure) (HCC)    takes Furosemide and Aldactone daily  . Complication of anesthesia    last surgery iv med when going to sleep"burned as injected"  . COPD (chronic obstructive pulmonary disease) (HCC)    no inhalers   . DCM (dilated cardiomyopathy) ([place])    EF 15-20% ? tachycardia induced - EF 50-55% on echo 2015  . Dyspnea    with exertion  . Dysrhythmia    afib  . Gallstones   . GERD (gastroesophageal reflux disease)    once in a while;depends on what she eats  . History of bronchitis    "chronic when I smoked; no problem since I quit in 2002" (10/27/2017)  . Hyperlipidemia   . Hypertension    takes Losartan and Metoprolol.   . Joint pain   . Muscle spasm    takes Flexeril daily as needed   . Nocturia   . Osteopenia   .  Persistent atrial fibrillation ([place])    s/p TEE DCCV and repeat DCCV 11/14/2013  . Personal history of chemotherapy   . Personal history of colonic polyps    adenomas 03 and 08  . Personal history of radiation therapy   . Pneumonia    hx  . Seasonal allergies   . Thyroid nodule   . Type II diabetes mellitus ([place])   . Vitamin D deficiency    takes Vit D    Family History  Problem Relation Age of Onset  . Dementia Sister   . Diabetes Sister   . Alzheimer's disease Sister   . Diabetes Brother        x 2  . Heart disease Brother   . Irritable bowel syndrome Brother   . Liver cancer Mother   . Diabetes Mother   . Pancreatic cancer Mother   .  Diabetes Father   . Heart disease Father     Past Surgical History:  Procedure Laterality Date  . AXILLARY LYMPH NODE DISSECTION Left 10/2001   persistent intramammary node Archie Endo 03/09/2011  . BACK SURGERY    . BREAST BIOPSY Left 2002  . BREAST BIOPSY Right 06/2017   "node bx was negative"  . BREAST BIOPSY Left 06/2017   "positive for cancer"  . BREAST LUMPECTOMY WITH RADIOACTIVE SEED LOCALIZATION Right 08/15/2017   Procedure: RIGHT BREAST LUMPECTOMY WITH RADIOACTIVE SEED LOCALIZATION;  Surgeon: Jovita Kussmaul, MD;  Location: [place];  Service: General;  Laterality: Right;  . CARDIAC CATHETERIZATION  2015  . CARDIOVERSION N/A 11/12/2013   Procedure: CARDIOVERSION;  Surgeon: Thayer Headings, MD;  Location: [facility] ENDOSCOPY;  Service: Cardiovascular;  Laterality: N/A;  10:08  Dr. Marissa Nestle, anesthesia present, L[place]   60mg ,  propofol 50mg , IV for elective cardioversion....Dr. Cathie Olden delievered synch 120 joules with successful cardioversion to NSR  . CARDIOVERSION N/A 11/12/2013   Procedure: CARDIOVERSION;  Surgeon: Thayer Headings, MD;  Location: [place];  Service: Cardiovascular;  Laterality: N/A;  . CARDIOVERSION N/A 11/14/2013   Procedure: CARDIOVERSION (BEDSIDE);  Surgeon: Larey Dresser, MD;  Location: [place];  Service: Cardiovascular;   Laterality: N/A;  . CARDIOVERSION N/A 02/22/2018   Procedure: CARDIOVERSION;  Surgeon: Sueanne Margarita, MD;  Location: [facility] ENDOSCOPY;  Service: Cardiovascular;  Laterality: N/A;  . CATARACT EXTRACTION W/ INTRAOCULAR LENS  IMPLANT, BILATERAL Bilateral ~ 2016  . COLONOSCOPY  2003, September 2008, April 01, 2011   adenomas 03 and 08, polyp 12, diverticulosis  . COLONOSCOPY WITH PROPOFOL N/A 11/16/2016   Procedure: COLONOSCOPY WITH PROPOFOL;  Surgeon: Gatha Mayer, MD;  [facility] ENDOS[facility];  Service: Endoscopy;  Laterality: N/A;  . JOINT REPLACEMENT    . LAPAROSCOPIC CHOLECYSTECTOMY  2004  . LEFT AND RIGHT HEART CATHETERIZATION WITH CORONARY ANGIOGRAM N/A 02/01/2014   Procedure: LEFT AND RIGHT HEART CATHETERIZATION WITH CORONARY ANGIOGRAM;  Surgeon: Sueanne Margarita, MD;  Location: [place] CATH LAB;  Service: Cardiovascular;  Laterality: N/A;  . LUMBAR LAMINECTOMY/DECOMPRESSION MICRODISCECTOMY N/A 03/19/2014   Procedure: LUMBAR LAMINECTOMY/DECOMPRESSION MICRODISCECTOMY 4 LEVEL;  Surgeon: Kristeen Miss, MD;  Location: [place] NEURO ORS;  Service: Neurosurgery;  Laterality: N/A;  L1-2 L2-3 L3-4 L4-5 Laminectomy/Foraminotomy  . MASS EXCISION Left 08/15/2017   Procedure: EXCISION OF LEFT BREAST MASS;  Surgeon: Jovita Kussmaul, MD;  Location: [place];  Service: General;  Laterality: Left;  Marland Kitchen MASTECTOMY Left 10/27/2017  . MASTECTOMY PARTIAL / LUMPECTOMY W/ AXILLARY LYMPHADENECTOMY  05/08/2001   Archie Endo 03/09/2011  . PORT-A-CATH REMOVAL  2003  . PORTA CATH INSERTION  2002  . TEE WITHOUT CARDIOVERSION N/A 11/12/2013   Procedure: TRANSESOPHAGEAL ECHOCARDIOGRAM (TEE);  Surgeon: Thayer Headings, MD;  Location: [place];  Service: Cardiovascular;  Laterality: N/A;  pt b/p low, pt buccal membranes very dry, lips scapped, pt c/o thirst. NPO since MN and iv FLUIDS TOTAL INFUSING AT TOTAL 20ML HR....  Dr. Cathie Olden order allow NS to bolus during procedure....very dry,NS bolus 250 ml total..pt responding well to meds..  . TOTAL KNEE  ARTHROPLASTY Left 2006  . TOTAL KNEE ARTHROPLASTY Right 05/10/2016   Procedure: RIGHT TOTAL KNEE ARTHROPLASTY;  Surgeon: Gaynelle Arabian, MD;  [facility];  Service: Orthopedics;  Laterality: Right;  With adductor block  . TOTAL MASTECTOMY Left 10/27/2017   Procedure: LEFT MASTECTOMY;  Surgeon: Jovita Kussmaul, MD;  Location: [place];  Service: General;  Laterality: Left;  . TUBAL LIGATION  Social History   Occupational History  . Occupation: retired  Tobacco Use  . Smoking status: Former Smoker    Packs/day: 1.50    Years: 35.00    Pack years: 52.50    Types: Cigarettes    Last attempt to quit: 08/24/2001    Years since quitting: 16.8  . Smokeless tobacco: Never Used  Substance and Sexual Activity  . Alcohol use: No  . Drug use: No  . Sexual activity: Never    Birth control/protection: Post-menopausal

## 2018-07-14 ENCOUNTER — Encounter (INDEPENDENT_AMBULATORY_CARE_PROVIDER_SITE_OTHER): Payer: Self-pay | Admitting: Orthopaedic Surgery

## 2018-07-14 DIAGNOSIS — M7061 Trochanteric bursitis, right hip: Secondary | ICD-10-CM | POA: Insufficient documentation

## 2018-07-14 MED ORDER — METHYLPREDNISOLONE ACETATE 40 MG/ML IJ SUSP
40.0000 mg | INTRAMUSCULAR | Status: AC | PRN
  Administered 2018-07-11: 40 mg via INTRA_ARTICULAR

## 2018-07-14 MED ORDER — BUPIVACAINE HCL 0.25 % IJ SOLN
2.0000 mL | INTRAMUSCULAR | Status: AC | PRN
  Administered 2018-07-11: 2 mL via INTRA_ARTICULAR

## 2018-07-14 MED ORDER — LIDOCAINE HCL 1 % IJ SOLN
0.5000 mL | INTRAMUSCULAR | Status: AC | PRN
  Administered 2018-07-11: .5 mL

## 2018-07-17 ENCOUNTER — Telehealth (INDEPENDENT_AMBULATORY_CARE_PROVIDER_SITE_OTHER): Payer: Self-pay | Admitting: Radiology

## 2018-07-17 NOTE — Telephone Encounter (Signed)
Patient left message requesting copy of x-rays from 07/11/18 OV with Dr. Lorin Mercy.  Patient is aware CD is ready for pickup at the front desk with charge.

## 2018-07-20 ENCOUNTER — Encounter: Payer: Self-pay | Admitting: Internal Medicine

## 2018-07-20 LAB — HM DIABETES EYE EXAM

## 2018-07-21 ENCOUNTER — Encounter: Payer: Self-pay | Admitting: Internal Medicine

## 2018-07-21 ENCOUNTER — Encounter (HOSPITAL_BASED_OUTPATIENT_CLINIC_OR_DEPARTMENT_OTHER): Payer: Medicare Other

## 2018-07-24 ENCOUNTER — Encounter: Payer: Self-pay | Admitting: Internal Medicine

## 2018-07-28 ENCOUNTER — Other Ambulatory Visit: Payer: Self-pay | Admitting: Neurological Surgery

## 2018-07-28 DIAGNOSIS — M25551 Pain in right hip: Secondary | ICD-10-CM

## 2018-08-01 ENCOUNTER — Encounter (HOSPITAL_COMMUNITY): Payer: Self-pay | Admitting: Nurse Practitioner

## 2018-08-01 ENCOUNTER — Ambulatory Visit (HOSPITAL_COMMUNITY)
Admission: RE | Admit: 2018-08-01 | Discharge: 2018-08-01 | Disposition: A | Discharge status: Home / Self Care | Payer: Medicare Other | Source: Ambulatory Visit | Attending: Nurse Practitioner | Admitting: Nurse Practitioner

## 2018-08-01 VITALS — BP 118/58 | HR 51 | Ht 66.0 in | Wt 229.0 lb

## 2018-08-01 DIAGNOSIS — Z9012 Acquired absence of left breast and nipple: Secondary | ICD-10-CM | POA: Insufficient documentation

## 2018-08-01 DIAGNOSIS — Z7901 Long term (current) use of anticoagulants: Secondary | ICD-10-CM | POA: Insufficient documentation

## 2018-08-01 DIAGNOSIS — I4891 Unspecified atrial fibrillation: Secondary | ICD-10-CM | POA: Diagnosis not present

## 2018-08-01 DIAGNOSIS — Z79891 Long term (current) use of opiate analgesic: Secondary | ICD-10-CM | POA: Diagnosis not present

## 2018-08-01 DIAGNOSIS — I420 Dilated cardiomyopathy: Secondary | ICD-10-CM | POA: Diagnosis not present

## 2018-08-01 DIAGNOSIS — Z853 Personal history of malignant neoplasm of breast: Secondary | ICD-10-CM | POA: Diagnosis not present

## 2018-08-01 DIAGNOSIS — Z833 Family history of diabetes mellitus: Secondary | ICD-10-CM | POA: Insufficient documentation

## 2018-08-01 DIAGNOSIS — Z96653 Presence of artificial knee joint, bilateral: Secondary | ICD-10-CM | POA: Insufficient documentation

## 2018-08-01 DIAGNOSIS — I110 Hypertensive heart disease with heart failure: Secondary | ICD-10-CM | POA: Diagnosis not present

## 2018-08-01 DIAGNOSIS — R001 Bradycardia, unspecified: Secondary | ICD-10-CM | POA: Insufficient documentation

## 2018-08-01 DIAGNOSIS — Z87891 Personal history of nicotine dependence: Secondary | ICD-10-CM | POA: Insufficient documentation

## 2018-08-01 DIAGNOSIS — Z9889 Other specified postprocedural states: Secondary | ICD-10-CM | POA: Insufficient documentation

## 2018-08-01 DIAGNOSIS — Z888 Allergy status to other drugs, medicaments and biological substances status: Secondary | ICD-10-CM | POA: Diagnosis not present

## 2018-08-01 DIAGNOSIS — Z7984 Long term (current) use of oral hypoglycemic drugs: Secondary | ICD-10-CM | POA: Insufficient documentation

## 2018-08-01 DIAGNOSIS — Z79899 Other long term (current) drug therapy: Secondary | ICD-10-CM | POA: Insufficient documentation

## 2018-08-01 DIAGNOSIS — J449 Chronic obstructive pulmonary disease, unspecified: Secondary | ICD-10-CM | POA: Diagnosis not present

## 2018-08-01 DIAGNOSIS — Z9851 Tubal ligation status: Secondary | ICD-10-CM | POA: Insufficient documentation

## 2018-08-01 DIAGNOSIS — K219 Gastro-esophageal reflux disease without esophagitis: Secondary | ICD-10-CM | POA: Insufficient documentation

## 2018-08-01 DIAGNOSIS — E119 Type 2 diabetes mellitus without complications: Secondary | ICD-10-CM | POA: Insufficient documentation

## 2018-08-01 DIAGNOSIS — I4811 Longstanding persistent atrial fibrillation: Secondary | ICD-10-CM

## 2018-08-01 DIAGNOSIS — Z8249 Family history of ischemic heart disease and other diseases of the circulatory system: Secondary | ICD-10-CM | POA: Diagnosis not present

## 2018-08-01 DIAGNOSIS — I4819 Other persistent atrial fibrillation: Secondary | ICD-10-CM

## 2018-08-01 DIAGNOSIS — E785 Hyperlipidemia, unspecified: Secondary | ICD-10-CM | POA: Diagnosis not present

## 2018-08-01 DIAGNOSIS — Z885 Allergy status to narcotic agent status: Secondary | ICD-10-CM | POA: Insufficient documentation

## 2018-08-01 LAB — COMPREHENSIVE METABOLIC PANEL
ALK PHOS: 99 U/L (ref 38–126)
ALT: 28 U/L (ref 0–44)
ANION GAP: 11 (ref 5–15)
AST: 30 U/L (ref 15–41)
Albumin: 3.7 g/dL (ref 3.5–5.0)
BILIRUBIN TOTAL: 1.1 mg/dL (ref 0.3–1.2)
BUN: 26 mg/dL — ABNORMAL HIGH (ref 8–23)
CO2: 23 mmol/L (ref 22–32)
Calcium: 10 mg/dL (ref 8.9–10.3)
Chloride: 104 mmol/L (ref 98–111)
Creatinine, Ser: 1.47 mg/dL — ABNORMAL HIGH (ref 0.44–1.00)
GFR, EST AFRICAN AMERICAN: 40 mL/min — AB (ref >60)
GFR, EST NON AFRICAN AMERICAN: 34 mL/min — AB (ref >60)
Glucose, Bld: 108 mg/dL — ABNORMAL HIGH (ref 70–99)
POTASSIUM: 4.4 mmol/L (ref 3.5–5.1)
Sodium: 138 mmol/L (ref 135–145)
TOTAL PROTEIN: 6.5 g/dL (ref 6.5–8.1)

## 2018-08-01 LAB — TSH: TSH: 0.488 u[IU]/mL (ref 0.350–4.500)

## 2018-08-01 NOTE — Progress Notes (Signed)
Primary Care Physician: Marletta Lor, MD Referring Physician: Hansel Starling   Cynthia Herrera is a 72 y.o. female with a h/o afib for f/u in afib cliinic for amiodarone use. She feels her afib burden is low. She is not having any issues with xarelto.  Today, she denies symptoms of palpitations, chest pain, shortness of breath, orthopnea, PND, lower extremity edema, dizziness, presyncope, syncope, or neurologic sequela. The patient is tolerating medications without difficulties and is otherwise without complaint today.   Past Medical History:  Diagnosis Date  . Arthritis   . Breast cancer, left breast ([place]) 2002   "then treated w/chemo and radiation"  . Breast cancer, left breast ([place]) 07/14/2017   "tx'd w/mastectomy"  . Chronic back pain    h/o lumbar stenosis; "no problem since my OR" (`10/27/2017)  . Chronic systolic CHF (congestive heart failure) (HCC)    takes Furosemide and Aldactone daily  . Complication of anesthesia    last surgery iv med when going to sleep"burned as injected"  . COPD (chronic obstructive pulmonary disease) (HCC)    no inhalers   . DCM (dilated cardiomyopathy) ([place])    EF 15-20% ? tachycardia induced - EF 50-55% on echo 2015  . Dyspnea    with exertion  . Dysrhythmia    afib  . Gallstones   . GERD (gastroesophageal reflux disease)    once in a while;depends on what she eats  . History of bronchitis    "chronic when I smoked; no problem since I quit in 2002" (10/27/2017)  . Hyperlipidemia   . Hypertension    takes Losartan and Metoprolol.   . Joint pain   . Muscle spasm    takes Flexeril daily as needed   . Nocturia   . Osteopenia   . Persistent atrial fibrillation    s/p TEE DCCV and repeat DCCV 11/14/2013  . Personal history of chemotherapy   . Personal history of colonic polyps    adenomas 03 and 08  . Personal history of radiation therapy   . Pneumonia    hx  . Seasonal allergies   . Thyroid nodule   . Type II diabetes mellitus  ([place])   . Vitamin D deficiency    takes Vit D   Past Surgical History:  Procedure Laterality Date  . AXILLARY LYMPH NODE DISSECTION Left 10/2001   persistent intramammary node Archie Endo 03/09/2011  . BACK SURGERY    . BREAST BIOPSY Left 2002  . BREAST BIOPSY Right 06/2017   "node bx was negative"  . BREAST BIOPSY Left 06/2017   "positive for cancer"  . BREAST LUMPECTOMY WITH RADIOACTIVE SEED LOCALIZATION Right 08/15/2017   Procedure: RIGHT BREAST LUMPECTOMY WITH RADIOACTIVE SEED LOCALIZATION;  Surgeon: Jovita Kussmaul, MD;  Location: [place];  Service: General;  Laterality: Right;  . CARDIAC CATHETERIZATION  2015  . CARDIOVERSION N/A 11/12/2013   Procedure: CARDIOVERSION;  Surgeon: Thayer Headings, MD;  Location: [facility] ENDOSCOPY;  Service: Cardiovascular;  Laterality: N/A;  10:08  Dr. Marissa Nestle, anesthesia present, [place]   60mg ,  propofol 50mg , IV for elective cardioversion....Dr. Cathie Olden delievered synch 120 joules with successful cardioversion to NSR  . CARDIOVERSION N/A 11/12/2013   Procedure: CARDIOVERSION;  Surgeon: Thayer Headings, MD;  Location: [place];  Service: Cardiovascular;  Laterality: N/A;  . CARDIOVERSION N/A 11/14/2013   Procedure: CARDIOVERSION (BEDSIDE);  Surgeon: Larey Dresser, MD;  Location: [place];  Service: Cardiovascular;  Laterality: N/A;  . CARDIOVERSION N/A 02/22/2018   Procedure:  CARDIOVERSION;  Surgeon: Sueanne Margarita, MD;  Location: [facility] ENDOSCOPY;  Service: Cardiovascular;  Laterality: N/A;  . CATARACT EXTRACTION W/ INTRAOCULAR LENS  IMPLANT, BILATERAL Bilateral ~ 2016  . COLONOSCOPY  2003, September 2008, April 01, 2011   adenomas 03 and 08, polyp 12, diverticulosis  . COLONOSCOPY WITH PROPOFOL N/A 11/16/2016   Procedure: COLONOSCOPY WITH PROPOFOL;  Surgeon: Gatha Mayer, MD;  Location: [facility] ENDOSCOPY;  Service: Endoscopy;  Laterality: N/A;  . JOINT REPLACEMENT    . LAPAROSCOPIC CHOLECYSTECTOMY  2004  . LEFT AND RIGHT HEART CATHETERIZATION WITH CORONARY ANGIOGRAM N/A  02/01/2014   Procedure: LEFT AND RIGHT HEART CATHETERIZATION WITH CORONARY ANGIOGRAM;  Surgeon: Sueanne Margarita, MD;  Location: [place] CATH LAB;  Service: Cardiovascular;  Laterality: N/A;  . LUMBAR LAMINECTOMY/DECOMPRESSION MICRODISCECTOMY N/A 03/19/2014   Procedure: LUMBAR LAMINECTOMY/DECOMPRESSION MICRODISCECTOMY 4 LEVEL;  Surgeon: Kristeen Miss, MD;  Location: [place] NEURO ORS;  Service: Neurosurgery;  Laterality: N/A;  L1-2 L2-3 L3-4 L4-5 Laminectomy/Foraminotomy  . MASS EXCISION Left 08/15/2017   Procedure: EXCISION OF LEFT BREAST MASS;  Surgeon: Jovita Kussmaul, MD;  Location: [place];  Service: General;  Laterality: Left;  Marland Kitchen MASTECTOMY Left 10/27/2017  . MASTECTOMY PARTIAL / LUMPECTOMY W/ AXILLARY LYMPHADENECTOMY  05/08/2001   Archie Endo 03/09/2011  . PORT-A-CATH REMOVAL  2003  . PORTA CATH INSERTION  2002  . TEE WITHOUT CARDIOVERSION N/A 11/12/2013   Procedure: TRANSESOPHAGEAL ECHOCARDIOGRAM (TEE);  Surgeon: Thayer Headings, MD;  Location: [facility];  Service: Cardiovascular;  Laterality: N/A;  pt b/p low, pt buccal membranes very dry, lips scapped, pt c/o thirst. NPO since MN and iv FLUIDS TOTAL INFUSING AT TOTAL 20ML HR....  Dr. Cathie Olden order allow NS to bolus during procedure....very dry,NS bolus 250 ml total..pt responding well to meds..  . TOTAL KNEE ARTHROPLASTY Left 2006  . TOTAL KNEE ARTHROPLASTY Right 05/10/2016   Procedure: RIGHT TOTAL KNEE ARTHROPLASTY;  Surgeon: Gaynelle Arabian, MD;  Location: [facility] ORS;  Service: Orthopedics;  Laterality: Right;  With adductor block  . TOTAL MASTECTOMY Left 10/27/2017   Procedure: LEFT MASTECTOMY;  Surgeon: Jovita Kussmaul, MD;  Location: [place];  Service: General;  Laterality: Left;  . TUBAL LIGATION      Current Outpatient Medications  Medication Sig Dispense Refill  . amiodarone (PACERONE) 200 MG tablet Take 1 tablet (200 mg total) by mouth daily. 90 tablet 3  . amoxicillin (AMOXIL) 500 MG capsule Take 2,000 mg by mouth See admin instructions. Take 2000 mg by  mouth 1 hour prior to dental appointment  5  . atorvastatin (LIPITOR) 40 MG tablet TAKE 1 TABLET BY MOUTH EVERYDAY AT BEDTIME 90 tablet 1  . Cholecalciferol (VITAMIN D3) 2000 units capsule Take 2,000 Units by mouth daily.    . clotrimazole-betamethasone (LOTRISONE) cream Apply 1 application topically 2 (two) times daily. 30 g 0  . fexofenadine (ALLEGRA) 180 MG tablet Take 180 mg by mouth daily as needed for allergies or rhinitis.    . fluticasone (FLONASE) 50 MCG/ACT nasal spray Place 1 spray into both nostrils daily as needed for allergies.     . furosemide (LASIX) 20 MG tablet Take 1 tablet (20 mg total) by mouth 2 (two) times daily. 180 tablet 3  . glucose blood test strip Use to check CBG AC and QHS. 200 each 12  . KLOR-CON M20 20 MEQ tablet Take 1 tablet (20 mEq total) by mouth daily. 90 tablet 3  . letrozole (FEMARA) 2.5 MG tablet Take 1 tablet (2.5 mg total)  by mouth daily. 90 tablet 3  . losartan (COZAAR) 25 MG tablet TAKE 1 TABLET BY MOUTH TWICE A DAY 180 tablet 3  . metFORMIN (GLUCOPHAGE) 500 MG tablet TAKE 1 TABLET TWICE A DAY WITH A MEAL 180 tablet 1  . nebivolol (BYSTOLIC) 2.5 MG tablet Take 1 tablet (2.5 mg total) by mouth daily. 90 tablet 3  . spironolactone (ALDACTONE) 25 MG tablet Take 1 tablet (25 mg total) by mouth daily. 90 tablet 3  . traMADol (ULTRAM) 50 MG tablet Take 1-2 tablets (50-100 mg total) by mouth every 6 (six) hours as needed. (Patient taking differently: Take 50-100 mg by mouth every 6 (six) hours as needed for moderate pain. ) 20 tablet 0  . XARELTO 20 MG TABS tablet TAKE 1 TABLET EVERY DAY WITH SUPPER 30 tablet 6   No current facility-administered medications for this encounter.     Allergies  Allergen Reactions  . Tikosyn [Dofetilide]     Prolonged QT/QTc  . Ambien [Zolpidem] Other (See Comments)    Hallucinations, up walking around  . Codeine Phosphate Nausea And Vomiting  . Medrol [Methylprednisolone] Other (See Comments)    Felt really weird w the  high dose oral steroid, but tolerates low doses or oral steroid    Social History   Socioeconomic History  . Marital status: Widowed    Spouse name: Not on file  . Number of children: 2  . Years of education: Not on file  . Highest education level: Not on file  Occupational History  . Occupation: retired  Scientific laboratory technician  . Financial resource strain: Not on file  . Food insecurity:    Worry: Not on file    Inability: Not on file  . Transportation needs:    Medical: Not on file    Non-medical: Not on file  Tobacco Use  . Smoking status: Former Smoker    Packs/day: 1.50    Years: 35.00    Pack years: 52.50    Types: Cigarettes    Last attempt to quit: 08/24/2001    Years since quitting: 16.9  . Smokeless tobacco: Never Used  Substance and Sexual Activity  . Alcohol use: No  . Drug use: No  . Sexual activity: Never    Birth control/protection: Post-menopausal  Lifestyle  . Physical activity:    Days per week: Not on file    Minutes per session: Not on file  . Stress: Not on file  Relationships  . Social connections:    Talks on phone: Not on file    Gets together: Not on file    Attends religious service: Not on file    Active member of club or organization: Not on file    Attends meetings of clubs or organizations: Not on file    Relationship status: Not on file  . Intimate partner violence:    Fear of current or ex partner: Not on file    Emotionally abused: Not on file    Physically abused: Not on file    Forced sexual activity: Not on file  Other Topics Concern  . Not on file  Social History Narrative  . Not on file    Family History  Problem Relation Age of Onset  . Dementia Sister   . Diabetes Sister   . Alzheimer's disease Sister   . Diabetes Brother        x 2  . Heart disease Brother   . Irritable bowel syndrome Brother   . Liver  cancer Mother   . Diabetes Mother   . Pancreatic cancer Mother   . Diabetes Father   . Heart disease Father      ROS- All systems are reviewed and negative except as per the HPI above  Physical Exam: Vitals:   08/01/18 1457  BP: (!) 118/58  Pulse: (!) 51  Weight: 103.9 kg  Height: 5\' 6"  (1.676 m)   Wt Readings from Last 3 Encounters:  08/01/18 103.9 kg  07/11/18 104.8 kg  07/04/18 106.4 kg    Labs: Lab Results  Component Value Date   NA 135 03/26/2018   K 3.8 03/26/2018   CL 102 03/26/2018   CO2 24 03/26/2018   GLUCOSE 111 (H) 03/26/2018   BUN 25 (H) 03/26/2018   CREATININE 1.07 (H) 03/26/2018   CALCIUM 9.6 03/26/2018   MG 2.1 03/26/2018   Lab Results  Component Value Date   INR 1.10 01/02/2018   Lab Results  Component Value Date   CHOL 121 03/03/2018   HDL 39 (L) 03/03/2018   LDLCALC 56 03/03/2018   TRIG 128 03/03/2018     GEN- The patient is well appearing, alert and oriented x 3 today.   Head- normocephalic, atraumatic Eyes-  Sclera clear, conjunctiva pink Ears- hearing intact Oropharynx- clear Neck- supple, no JVP Lymph- no cervical lymphadenopathy Lungs- Clear to ausculation bilaterally, normal work of breathing Heart- Slow regular rate and rhythm, no murmurs, rubs or gallops, PMI not laterally displaced GI- soft, NT, ND, + BS Extremities- no clubbing, cyanosis, or edema MS- no significant deformity or atrophy Skin- no rash or lesion Psych- euthymic mood, full affect Neuro- strength and sensation are intact  EKG- Sinus brady at 51 bpm, pr int 160 ms, qrs int 84 ms, qtc 376 ms Epic records reviewed    Assessment and Plan: 1. Afib  Appears to be staying in SR Continue amiodarone 200 mg qd Cmet, tsh today  2. CHA2DS2VASc of at least 5 Continue xarekto 20 md daily  3. LV dysfunction  EF has improved with NSR  Being followed by Dr. Haroldine Laws  4. Bradycardia Continue to watch Asymptomatic   May have to stop bystolic if become symptomatic   F/u with Dr. Danton Sewer. Haroldine Laws as scheduled afib clinic as needed  Geroge Baseman. Ferdinando Lodge, [facility] [address] [place], [place] [address] [phone]

## 2018-08-02 ENCOUNTER — Ambulatory Visit (HOSPITAL_COMMUNITY): Payer: Medicare Other | Admitting: Nurse Practitioner

## 2018-08-09 ENCOUNTER — Inpatient Hospital Stay: Payer: Medicare Other | Attending: Hematology and Oncology | Admitting: Hematology and Oncology

## 2018-08-09 VITALS — BP 120/43 | HR 57 | Temp 97.8°F | Resp 17 | Ht 66.0 in | Wt 229.1 lb

## 2018-08-09 DIAGNOSIS — R5383 Other fatigue: Secondary | ICD-10-CM | POA: Diagnosis not present

## 2018-08-09 DIAGNOSIS — Z170 Estrogen receptor positive status [ER+]: Secondary | ICD-10-CM | POA: Diagnosis not present

## 2018-08-09 DIAGNOSIS — Z7984 Long term (current) use of oral hypoglycemic drugs: Secondary | ICD-10-CM | POA: Diagnosis not present

## 2018-08-09 DIAGNOSIS — Z9221 Personal history of antineoplastic chemotherapy: Secondary | ICD-10-CM | POA: Diagnosis not present

## 2018-08-09 DIAGNOSIS — R911 Solitary pulmonary nodule: Secondary | ICD-10-CM | POA: Diagnosis not present

## 2018-08-09 DIAGNOSIS — Z23 Encounter for immunization: Secondary | ICD-10-CM | POA: Insufficient documentation

## 2018-08-09 DIAGNOSIS — I509 Heart failure, unspecified: Secondary | ICD-10-CM

## 2018-08-09 DIAGNOSIS — R0602 Shortness of breath: Secondary | ICD-10-CM | POA: Diagnosis not present

## 2018-08-09 DIAGNOSIS — R232 Flushing: Secondary | ICD-10-CM | POA: Diagnosis not present

## 2018-08-09 DIAGNOSIS — Z9012 Acquired absence of left breast and nipple: Secondary | ICD-10-CM

## 2018-08-09 DIAGNOSIS — Z79811 Long term (current) use of aromatase inhibitors: Secondary | ICD-10-CM

## 2018-08-09 DIAGNOSIS — C50112 Malignant neoplasm of central portion of left female breast: Secondary | ICD-10-CM

## 2018-08-09 DIAGNOSIS — Z79899 Other long term (current) drug therapy: Secondary | ICD-10-CM | POA: Diagnosis not present

## 2018-08-09 MED ORDER — INFLUENZA VAC SPLIT QUAD 0.5 ML IM SUSY
PREFILLED_SYRINGE | INTRAMUSCULAR | Status: AC
  Filled 2018-08-09: qty 0.5

## 2018-08-09 MED ORDER — INFLUENZA VAC SPLIT QUAD 0.5 ML IM SUSY
0.5000 mL | PREFILLED_SYRINGE | Freq: Once | INTRAMUSCULAR | Status: AC
  Administered 2018-08-09: 0.5 mL via INTRAMUSCULAR

## 2018-08-09 NOTE — Assessment & Plan Note (Signed)
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 Nonspecific pulmonary nodule left upper lobe 3 mm  Oncotype testing could not be performed because it was felt that the tumor was a recurrent cancer and Oncotype DX has no ability in this situation.   Recommendation: adjuvant antiestrogen therapy with letrozole 2.5 mg daily started 11/14/2017 (I offered her systemic chemotherapy but patient declined)  Letrozole toxicities: 1.  Hot flashes and sweats Patient has chronic fatigue because of her cardiac issues.  She is otherwise tolerating letrozole well.  Breast cancer surveillance: 1.  Mammogram will need to be scheduled.  I will send a message to the breast center 2. breast exam 08/09/2018: Benign  Return to clinic in 1 year for follow-up

## 2018-08-09 NOTE — Progress Notes (Signed)
Patient Care Team: Marletta Lor, MD as PCP - Bethena Roys, MD as PCP - Electrophysiology (Cardiology)  DIAGNOSIS:  Encounter Diagnosis  Name Primary?  . Malignant neoplasm of central portion of left breast in female, estrogen receptor positive ([place]) Yes    SUMMARY OF ONCOLOGIC HISTORY:   Malignant neoplasm of central portion of left breast in female, estrogen receptor positive ([place])   2002 Initial Diagnosis    Left breast cancer treated with lumpectomy followed by adjuvant chemotherapy (does not remember chemotherapy drugs) and radiation.  Did not take antiestrogen therapy    07/11/2017 Relapse/Recurrence    Left breast skin papule; biopsy proven to be invasive mammary carcinoma ER 100% PR 90% positive HER-2 negative ratio 1.64; Right breast 1.5 cm indeterminate calcifications biopsy proven to be complex sclerosing lesion    08/15/2017 Surgery    Left breast excision: Invasive carcinoma involving the dermis, ER 100%, PR 90%, HER-2 negative ratio 1.64.  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  She does complain of fatigue as well as profound hot flashes and sweats.  These happen when she is in an enclosed environment.  Denies any pain or discomfort in the breast.  She has had chronic shortness of breath and has been going to cardiology for congestive heart failure.  REVIEW OF SYSTEMS:     Constitutional: Denies fevers, chills or abnormal weight loss Eyes: Denies blurriness of vision Ears, nose, mouth, throat, and face: Denies mucositis or sore throat Respiratory: Denies cough, dyspnea or wheezes Cardiovascular: Denies palpitation, chest discomfort Gastrointestinal:  Denies nausea, heartburn or change in bowel habits Skin: Denies abnormal skin rashes Lymphatics: Denies new lymphadenopathy or easy bruising Neurological:Denies numbness, tingling or new weaknesses Behavioral/Psych: Mood is stable, no new changes  Extremities: No lower extremity edema Breast:  denies any pain or lumps or nodules in either breasts All other systems were reviewed with the patient and are negative.  I have reviewed the past medical history, past surgical history, social history and family history with the patient and they are unchanged from previous note.  ALLERGIES:  is allergic to tikosyn [dofetilide]; ambien [zolpidem]; codeine phosphate; and medrol [methylprednisolone].  MEDICATIONS:  Current Outpatient Medications  Medication Sig Dispense Refill  . amiodarone (PACERONE) 200 MG tablet Take 1 tablet (200 mg total) by mouth daily. 90 tablet 3  . amoxicillin (AMOXIL) 500 MG capsule Take 2,000 mg by mouth See admin instructions. Take 2000 mg by mouth 1 hour prior to dental appointment  5  . atorvastatin (LIPITOR) 40 MG tablet TAKE 1 TABLET BY MOUTH EVERYDAY AT BEDTIME 90 tablet 1  . Cholecalciferol (VITAMIN D3) 2000 units capsule Take 2,000 Units by mouth daily.    . clotrimazole-betamethasone (LOTRISONE) cream Apply 1 application topically 2 (two) times daily. 30 g 0  . fexofenadine (ALLEGRA) 180 MG tablet Take 180 mg by mouth daily as needed for allergies or rhinitis.    Marland Kitchen  fluticasone (FLONASE) 50 MCG/ACT nasal spray Place 1 spray into both nostrils daily as needed for allergies.     . furosemide (LASIX) 20 MG tablet Take 1 tablet (20 mg total) by mouth 2 (two) times daily. 180 tablet 3  .  glucose blood test strip Use to check CBG AC and QHS. 200 each 12  . KLOR-CON M20 20 MEQ tablet Take 1 tablet (20 mEq total) by mouth daily. 90 tablet 3  . letrozole (FEMARA) 2.5 MG tablet Take 1 tablet (2.5 mg total) by mouth daily. 90 tablet 3  . losartan (COZAAR) 25 MG tablet TAKE 1 TABLET BY MOUTH TWICE A DAY 180 tablet 3  . metFORMIN (GLUCOPHAGE) 500 MG tablet TAKE 1 TABLET TWICE A DAY WITH A MEAL 180 tablet 1  . nebivolol (BYSTOLIC) 2.5 MG tablet Take 1 tablet (2.5 mg total) by mouth daily. 90 tablet 3  . spironolactone (ALDACTONE) 25 MG tablet Take 1 tablet (25 mg total) by mouth daily. 90 tablet 3  . traMADol (ULTRAM) 50 MG tablet Take 1-2 tablets (50-100 mg total) by mouth every 6 (six) hours as needed. (Patient taking differently: Take 50-100 mg by mouth every 6 (six) hours as needed for moderate pain. ) 20 tablet 0  . XARELTO 20 MG TABS tablet TAKE 1 TABLET EVERY DAY WITH SUPPER 30 tablet 6   No current facility-administered medications for this visit.     PHYSICAL EXAMINATION: ECOG PERFORMANCE STATUS: 1 - Symptomatic but completely ambulatory  Vitals:   08/09/18 1532  BP: (!) 120/43  Pulse: (!) 57  Resp: 17  Temp: 97.8 F (36.6 C)  SpO2: 97%   Filed Weights   08/09/18 1532  Weight: 229 lb 1.6 oz (103.9 kg)    GENERAL:alert, no distress and comfortable SKIN: skin color, texture, turgor are normal, no rashes or significant lesions EYES: normal, Conjunctiva are pink and non-injected, sclera clear OROPHARYNX:no exudate, no erythema and lips, buccal mucosa, and tongue normal  NECK: supple, thyroid normal size, non-tender, without nodularity LYMPH:  no palpable lymphadenopathy in the cervical, axillary or inguinal LUNGS: clear to auscultation and percussion with normal breathing effort HEART: regular rate & rhythm and no murmurs and no lower extremity edema ABDOMEN:abdomen soft, non-tender and normal bowel sounds MUSCULOSKELETAL:no cyanosis of digits and no clubbing    NEURO: alert & oriented x 3 with fluent speech, no focal motor/sensory deficits EXTREMITIES: No lower extremity edema BREAST: No palpable masses or nodules in either right or left breasts. No palpable axillary supraclavicular or infraclavicular adenopathy no breast tenderness or nipple discharge. (exam performed in the presence of a chaperone)  LABORATORY DATA:  I have reviewed the data as listed CMP Latest Ref Rng & Units 08/01/2018 03/26/2018 03/25/2018  Glucose 70 - 99 mg/dL 108(H) 111(H) 115(H)  BUN 8 - 23 mg/dL 26(H) 25(H) 26(H)  Creatinine 0.44 - 1.00 mg/dL 1.47(H) 1.07(H) 1.13(H)  Sodium 135 - 145 mmol/L 138 135 136  Potassium 3.5 - 5.1 mmol/L 4.4 3.8 4.2  Chloride 98 - 111 mmol/L 104 102 101  CO2 22 - 32 mmol/L '23 24 26  '$ Calcium 8.9 - 10.3 mg/dL 10.0 9.6 9.6  Total Protein 6.5 - 8.1 g/dL 6.5 - -  Total Bilirubin 0.3 - 1.2 mg/dL 1.1 - -  Alkaline Phos 38 - 126 U/L 99 - -  AST 15 - 41 U/L 30 - -  ALT 0 - 44 U/L 28 - -    Lab Results  Component Value Date   WBC  9.1 01/02/2018   HGB 12.5 01/02/2018   HCT 37.8 01/02/2018   MCV 90.2 01/02/2018   PLT 335 01/02/2018   NEUTROABS 7.5 02/03/2017    ASSESSMENT & PLAN:  Malignant neoplasm of central portion of left breast in female, estrogen receptor positive ([place]) 10/27/2017: Left simple mastectomy: Recurrent multifocal IDC grade 2 largest spanning 3.5 cm, 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 Nonspecific pulmonary nodule left upper lobe 3 mm  Oncotype testing could not be performed because it was felt that the tumor was a recurrent cancer and Oncotype DX has no ability in this situation.    Recommendation: adjuvant antiestrogen therapy with letrozole 2.5 mg daily started 11/14/2017 (I offered her systemic chemotherapy but patient declined)  Letrozole toxicities: 1.  Hot flashes and sweats Patient has chronic fatigue because of her cardiac issues.  She is otherwise tolerating letrozole well.  Breast cancer surveillance: 1.  Mammogram will need to be scheduled.  I will send a message to the breast center 2. breast exam 08/09/2018: Benign  Return to clinic in 1 year for follow-up  Orders Placed This Encounter  Procedures  . MM DIAG BREAST TOMO BILATERAL    Standing Status:   Future    Standing Expiration Date:   08/10/2019    Order Specific Question:   Reason for Exam (SYMPTOM  OR DIAGNOSIS REQUIRED)    Answer:   Annual mammograms    Order Specific Question:   Preferred imaging location?    Answer:   [facility]   The patient has a good understanding of the overall plan. she agrees with it. she will call with any problems that may develop before the next visit here.   Harriette Ohara, MD 08/09/18

## 2018-08-10 ENCOUNTER — Other Ambulatory Visit: Payer: Self-pay | Admitting: Hematology and Oncology

## 2018-08-10 ENCOUNTER — Telehealth: Payer: Self-pay | Admitting: Hematology and Oncology

## 2018-08-10 DIAGNOSIS — Z1231 Encounter for screening mammogram for malignant neoplasm of breast: Secondary | ICD-10-CM

## 2018-08-10 NOTE — Telephone Encounter (Signed)
Mailed pt calendar  °

## 2018-08-13 ENCOUNTER — Ambulatory Visit
Admission: RE | Admit: 2018-08-13 | Discharge: 2018-08-13 | Disposition: A | Discharge status: Home / Self Care | Payer: Medicare Other | Source: Ambulatory Visit | Attending: Neurological Surgery | Admitting: Neurological Surgery

## 2018-08-13 DIAGNOSIS — M25551 Pain in right hip: Secondary | ICD-10-CM

## 2018-08-21 ENCOUNTER — Ambulatory Visit
Admission: RE | Admit: 2018-08-21 | Discharge: 2018-08-21 | Disposition: A | Discharge status: Home / Self Care | Payer: Medicare Other | Source: Ambulatory Visit | Attending: Hematology and Oncology | Admitting: Hematology and Oncology

## 2018-08-21 DIAGNOSIS — Z1231 Encounter for screening mammogram for malignant neoplasm of breast: Secondary | ICD-10-CM

## 2018-08-21 NOTE — Progress Notes (Signed)
Cardiology Office Note:    Date:  08/22/2018   ID:  Cynthia Herrera, DOB 1946-06-19, MRN [id number]  PCP:  Marletta Lor, MD  Cardiologist:  No primary care provider on file.    Referring MD: Marletta Lor, MD   Chief Complaint  Patient presents with  . Sleep Apnea  . Congestive Heart Failure  . Cardiomyopathy  . Hypertension  . Atrial Fibrillation    History of Present Illness:    Cynthia Herrera is a 72 y.o. 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 She was again cardioverted on 1/21/18after more loading with amio but failed to convert. Later that day she spontaneously converted to NSR. She stopped amio due to severe dizziness.   I saw her back in April 2018 she was back in atrial fibrillation with RVR.  She underwent repeat cardioversion to sinus rhythm on 02/22/2018.  Myoview 03/2018 showed EF improved  with no ischemia.  Repeat 2D echocardiogram 06/21/2018 showed improvement in LV function with EF improved from 25% up to 35 to 40%. She was seen by Dr. Haroldine Laws in August for chronic systolic heart failure with nonischemic dilated tachycardia mediated A. fib.  She was near heart association class III symptoms angina.  Stable at that time she was continued on spironolactone, Bystolic, losartan and Lasix.  She was seen by Roderic Palau in A. fib clinic in early October and was maintaining sinus rhythm on amiodarone.    She underwent sleep study showing severe obstructive sleep apnea with an AHI 47.3/h and no significant central sleep apnea with moderate oxygen desaturations as low as 79%.  She is scheduled to have her CPAP titration at sleep lab done tonight.  She is here today for followup.  She denies any chest pain  or pressure, SOB, DOE, PND, orthopnea, LE edema, dizziness, palpitations or syncope. She is compliant with her meds and is tolerating meds with no SE.  Her main complaint is that of ongoing fatigue.     Past Medical History:  Diagnosis Date  . Arthritis   . Breast cancer, left breast ([place]) 2002   "then treated w/chemo and radiation"  . Breast cancer, left breast ([place]) 07/14/2017   "tx'd w/mastectomy"  . Chronic back pain    h/o lumbar stenosis; "no problem since my OR" (`10/27/2017)  . Chronic systolic CHF (congestive heart failure) (H[facility])    takes Furosemide and Aldactone daily  . Complication of anesthesia    last surgery iv med when going to sleep"burned as injected"  . COPD (chronic obstructive pulmonary disease) ([facility])    no inhalers   . DCM (dilated cardiomyopathy) ([place])    EF 15-20% ? tachycardia induced - EF 50-55% on echo 2015  . Dyspnea    with exertion  . Dysrhythmia    afib  . Gallstones   . GERD (gastroesophageal reflux disease)    once in a while;depends on what she eats  . History of bronchitis    "chronic when I smoked; no problem since I quit in 2002" (10/27/2017)  . Hyperlipidemia   . Hypertension    takes Losartan and Metoprolol.   . Joint pain   . Muscle spasm    takes Flexeril daily as needed   . Nocturia   .  OSA (obstructive sleep apnea) 08/22/2018   Severe obstructive sleep apnea with an AHI 47.3/h and no significant central sleep apnea with moderate oxygen desaturations as low as 79%  . Osteopenia   . Persistent atrial fibrillation    s/p TEE DCCV and repeat DCCV 11/14/2013  . Personal history of chemotherapy   . Personal history of colonic polyps    adenomas 03 and 08  . Personal history of radiation therapy   . Pneumonia    hx  . Seasonal allergies   . Thyroid nodule   . Type II diabetes mellitus ([place])   . Vitamin D deficiency    takes Vit D    Past Surgical History:  Procedure Laterality Date  . AXILLARY LYMPH NODE DISSECTION Left 10/2001    persistent intramammary node Archie Endo 03/09/2011  . BACK SURGERY    . BREAST BIOPSY Left 2002  . BREAST BIOPSY Right 06/2017   "node bx was negative"  . BREAST BIOPSY Left 06/2017   "positive for cancer"  . BREAST LUMPECTOMY WITH RADIOACTIVE SEED LOCALIZATION Right 08/15/2017   Procedure: RIGHT BREAST LUMPECTOMY WITH RADIOACTIVE SEED LOCALIZATION;  Surgeon: Jovita Kussmaul, MD;  Location: [facility];  Service: General;  Laterality: Right;  . CARDIAC CATHETERIZATION  2015  . CARDIOVERSION N/A 11/12/2013   Procedure: CARDIOVERSION;  Surgeon: Thayer Headings, MD;  Location: [facility] ENDOSCOPY;  Service: Cardiovascular;  Laterality: N/A;  10:08  Dr. Marissa Nestle, anesthesia present, [place]   60mg ,  propofol 50mg , IV for elective cardioversion....Dr. Cathie Olden delievered synch 120 joules with successful cardioversion to NSR  . CARDIOVERSION N/A 11/12/2013   Procedure: CARDIOVERSION;  Surgeon: Thayer Headings, MD;  Location: [place];  Service: Cardiovascular;  Laterality: N/A;  . CARDIOVERSION N/A 11/14/2013   Procedure: CARDIOVERSION (BEDSIDE);  Surgeon: Larey Dresser, MD;  Location: [place];  Service: Cardiovascular;  Laterality: N/A;  . CARDIOVERSION N/A 02/22/2018   Procedure: CARDIOVERSION;  Surgeon: Sueanne Margarita, MD;  Location: [facility] ENDOSCOPY;  Service: Cardiovascular;  Laterality: N/A;  . CATARACT EXTRACTION W/ INTRAOCULAR LENS  IMPLANT, BILATERAL Bilateral ~ 2016  . COLONOSCOPY  2003, September 2008, April 01, 2011   adenomas 03 and 08, polyp 12, diverticulosis  . COLONOSCOPY WITH PROPOFOL N/A 11/16/2016   Procedure: COLONOSCOPY WITH PROPOFOL;  Surgeon: Gatha Mayer, MD;  Location: [facility] ENDOSCOPY;  Service: Endoscopy;  Laterality: N/A;  . JOINT REPLACEMENT    . LAPAROSCOPIC CHOLECYSTECTOMY  2004  . LEFT AND RIGHT HEART CATHETERIZATION WITH CORONARY ANGIOGRAM N/A 02/01/2014   Procedure: LEFT AND RIGHT HEART CATHETERIZATION WITH CORONARY ANGIOGRAM;  Surgeon: Sueanne Margarita, MD;  Location: [place] CATH LAB;  Service:  Cardiovascular;  Laterality: N/A;  . LUMBAR LAMINECTOMY/DECOMPRESSION MICRODISCECTOMY N/A 03/19/2014   Procedure: LUMBAR LAMINECTOMY/DECOMPRESSION MICRODISCECTOMY 4 LEVEL;  Surgeon: Kristeen Miss, MD;  Location: [place] NEURO ORS;  Service: Neurosurgery;  Laterality: N/A;  L1-2 L2-3 L3-4 L4-5 Laminectomy/Foraminotomy  . MASS EXCISION Left 08/15/2017   Procedure: EXCISION OF LEFT BREAST MASS;  Surgeon: Jovita Kussmaul, MD;  Location: [place];  Service: General;  Laterality: Left;  Marland Kitchen MASTECTOMY Left 10/27/2017  . MASTECTOMY PARTIAL / LUMPECTOMY W/ AXILLARY LYMPHADENECTOMY  05/08/2001   Archie Endo 03/09/2011  . PORT-A-CATH REMOVAL  2003  . PORTA CATH INSERTION  2002  . TEE WITHOUT CARDIOVERSION N/A 11/12/2013   Procedure: TRANSESOPHAGEAL ECHOCARDIOGRAM (TEE);  Surgeon: Thayer Headings, MD;  Location: [place];  Service: Cardiovascular;  Laterality: N/A;  pt b/p low, pt buccal membranes very dry, lips scapped, pt  c/o thirst. NPO since MN and iv FLUIDS TOTAL INFUSING AT TOTAL 20ML HR....  Dr. Cathie Olden order allow NS to bolus during procedure....very dry,NS bolus 250 ml total..pt responding well to meds..  . TOTAL KNEE ARTHROPLASTY Left 2006  . TOTAL KNEE ARTHROPLASTY Right 05/10/2016   Procedure: RIGHT TOTAL KNEE ARTHROPLASTY;  Surgeon: Gaynelle Arabian, MD;  Location: [facility] ORS;  Service: Orthopedics;  Laterality: Right;  With adductor block  . TOTAL MASTECTOMY Left 10/27/2017   Procedure: LEFT MASTECTOMY;  Surgeon: Jovita Kussmaul, MD;  Location: [place];  Service: General;  Laterality: Left;  . TUBAL LIGATION      Current Medications: Current Meds  Medication Sig  . amiodarone (PACERONE) 200 MG tablet Take 1 tablet (200 mg total) by mouth daily.  Marland Kitchen amoxicillin (AMOXIL) 500 MG capsule Take 2,000 mg by mouth See admin instructions. Take 2000 mg by mouth 1 hour prior to dental appointment  . atorvastatin (LIPITOR) 40 MG tablet TAKE 1 TABLET BY MOUTH EVERYDAY AT BEDTIME  . Cholecalciferol (VITAMIN D3) 2000 units capsule  Take 2,000 Units by mouth daily.  . clotrimazole-betamethasone (LOTRISONE) cream Apply 1 application topically 2 (two) times daily.  . fexofenadine (ALLEGRA) 180 MG tablet Take 180 mg by mouth daily as needed for allergies or rhinitis.  . fluticasone (FLONASE) 50 MCG/ACT nasal spray Place 1 spray into both nostrils daily as needed for allergies.   . furosemide (LASIX) 20 MG tablet Take 1 tablet (20 mg total) by mouth 2 (two) times daily.  Marland Kitchen glucose blood test strip Use to check CBG AC and QHS.  Marland Kitchen KLOR-CON M20 20 MEQ tablet Take 1 tablet (20 mEq total) by mouth daily.  Marland Kitchen letrozole (FEMARA) 2.5 MG tablet Take 1 tablet (2.5 mg total) by mouth daily.  Marland Kitchen losartan (COZAAR) 25 MG tablet TAKE 1 TABLET BY MOUTH TWICE A DAY  . metFORMIN (GLUCOPHAGE) 500 MG tablet TAKE 1 TABLET TWICE A DAY WITH A MEAL  . nebivolol (BYSTOLIC) 2.5 MG tablet Take 1 tablet (2.5 mg total) by mouth daily.  Marland Kitchen spironolactone (ALDACTONE) 25 MG tablet Take 1 tablet (25 mg total) by mouth daily.  . traMADol (ULTRAM) 50 MG tablet Take 1-2 tablets (50-100 mg total) by mouth every 6 (six) hours as needed. (Patient taking differently: Take 50-100 mg by mouth every 6 (six) hours as needed for moderate pain. )  . XARELTO 20 MG TABS tablet TAKE 1 TABLET EVERY DAY WITH SUPPER     Allergies:   Tikosyn [dofetilide]; Zolpidem; Codeine; Codeine phosphate; and Methylprednisolone   Social History   Socioeconomic History  . Marital status: Widowed    Spouse name: Not on file  . Number of children: 2  . Years of education: Not on file  . Highest education level: Not on file  Occupational History  . Occupation: retired  Scientific laboratory technician  . Financial resource strain: Not on file  . Food insecurity:    Worry: Not on file    Inability: Not on file  . Transportation needs:    Medical: Not on file    Non-medical: Not on file  Tobacco Use  . Smoking status: Former Smoker    Packs/day: 1.50    Years: 35.00    Pack years: 52.50    Types:  Cigarettes    Last attempt to quit: 08/24/2001    Years since quitting: 17.0  . Smokeless tobacco: Never Used  Substance and Sexual Activity  . Alcohol use: No  . Drug use: No  .  Sexual activity: Never    Birth control/protection: Post-menopausal  Lifestyle  . Physical activity:    Days per week: Not on file    Minutes per session: Not on file  . Stress: Not on file  Relationships  . Social connections:    Talks on phone: Not on file    Gets together: Not on file    Attends religious service: Not on file    Active member of club or organization: Not on file    Attends meetings of clubs or organizations: Not on file    Relationship status: Not on file  Other Topics Concern  . Not on file  Social History Narrative  . Not on file     Family History: The patient's family history includes Alzheimer's disease in her sister; Dementia in her sister; Diabetes in her brother, father, mother, and sister; Heart disease in her brother and father; Irritable bowel syndrome in her brother; Liver cancer in her mother; Pancreatic cancer in her mother.  ROS:   Please see the history of present illness.    ROS  All other systems reviewed and negative.   EKGs/Labs/Other Studies Reviewed:    The following studies were reviewed today: 2D echo and stress test  EKG:  EKG is not ordered today.   Recent Labs: 01/02/2018: Hemoglobin 12.5; Platelets 335 03/26/2018: Magnesium 2.1 08/01/2018: ALT 28; BUN 26; Creatinine, Ser 1.47; Potassium 4.4; Sodium 138; TSH 0.488   Recent Lipid Panel    Component Value Date/Time   CHOL 121 03/03/2018 0947   TRIG 128 03/03/2018 0947   HDL 39 (L) 03/03/2018 0947   CHOLHDL 3.1 03/03/2018 0947   CHOLHDL 3 02/03/2017 1207   VLDL 23.0 02/03/2017 1207   LDLCALC 56 03/03/2018 0947    Physical Exam:    VS:  BP 124/68   Pulse 70   Ht 5\' 6"  (1.676 m)   Wt 231 lb 12.8 oz (105.1 kg)   SpO2 93%   BMI 37.41 kg/m     Wt Readings from Last 3 Encounters:    08/22/18 231 lb 12.8 oz (105.1 kg)  08/09/18 229 lb 1.6 oz (103.9 kg)  08/01/18 229 lb (103.9 kg)     GEN:  Well nourished, well developed in no acute distress HEENT: Normal NECK: No JVD; No carotid bruits LYMPHATICS: No lymphadenopathy CARDIAC: RRR, no murmurs, rubs, gallops RESPIRATORY:  Clear to auscultation without rales, wheezing or rhonchi  ABDOMEN: Soft, non-tender, non-distended MUSCULOSKELETAL:  No edema; No deformity  SKIN: Warm and dry NEUROLOGIC:  Alert and oriented x 3 PSYCHIATRIC:  Normal affect   ASSESSMENT:    1. Chronic combined systolic and diastolic CHF (congestive heart failure) ([place])   2. DCM (dilated cardiomyopathy) (HCC)   3. Persistent atrial fibrillation   4. Essential hypertension   5. OSA (obstructive sleep apnea)    PLAN:    In order of problems listed above:  1.  Chronic combined systolic/diastolic CHF - she appears euvolemic on exam today.  Her weight is stable.  She is NYHA Class II.  She still complains of fatigue but could be related to her OSA.  She will continue on Lasix 20mg  BID.  She will continue on Bystolic 2.5mg  daily, spironolactone 25mg  daily, Losartan 25mg  BID. Has had bradycardia with higher doses of BB.  Could consider addition of Entresto as EF was 35-40% on echo.  Will see if sx improve after starting CPAP.    2.  DCM - suspect tachycardia mediated.  Cath  2015 with normal cors.  Treated with chemo/XRT in 2002 for breast CA but does not think she got Adriamycin.  EF 45% at time of cath in 2015 but dropped to 25% in 02/2018 at time of afib with RVR reoccurrence.  Echo 05/2018 showed improvement in EF from 25% now up to 35-40%.  Continue ARB/BB and diuretics.   3.   Persistent atrial fibrillation - Now maintaining NSR on Amio.  Failed Tikosyn 03/2018 due to QT prolongation. She has significant fatigue but suspect this is more due to severe OSA.  Plan to get on CPAP and if fatigue not improved in a few months then refer back to EP to consider  Afib ablation and get off Amio.   She is up to date on her TSH and LFTs but will get PFTs with DLCO since she is on Amio.   4.  HTN - BP is controlled on exam today.  She will continue on Losartan 25mg  BID, spiro 25mg  daily and Bysotlic 2.5mg  daily.    5.  OSA - PSG showed  severe obstructive sleep apnea with an AHI 47.3/h and no significant central sleep apnea with moderate oxygen desaturations as low as 79%.  She is scheduled to have her CPAP titration at sleep lab done tonight.  I will see her back in 10 weeks to see how she is doing on PAP therapy.     Medication Adjustments/Labs and Tests Ordered: Current medicines are reviewed at length with the patient today.  Concerns regarding medicines are outlined above.  No orders of the defined types were placed in this encounter.  No orders of the defined types were placed in this encounter.   Signed, [name], MD  08/22/2018 10:16 AM    [place]

## 2018-08-22 ENCOUNTER — Encounter: Payer: Self-pay | Admitting: Cardiology

## 2018-08-22 ENCOUNTER — Ambulatory Visit (HOSPITAL_BASED_OUTPATIENT_CLINIC_OR_DEPARTMENT_OTHER): Payer: Medicare Other | Attending: Cardiology | Admitting: Cardiology

## 2018-08-22 ENCOUNTER — Ambulatory Visit: Payer: Medicare Other | Admitting: Cardiology

## 2018-08-22 VITALS — Ht 66.0 in | Wt 228.0 lb

## 2018-08-22 VITALS — BP 124/68 | HR 70 | Ht 66.0 in | Wt 231.8 lb

## 2018-08-22 DIAGNOSIS — I4819 Other persistent atrial fibrillation: Secondary | ICD-10-CM

## 2018-08-22 DIAGNOSIS — I10 Essential (primary) hypertension: Secondary | ICD-10-CM

## 2018-08-22 DIAGNOSIS — I5042 Chronic combined systolic (congestive) and diastolic (congestive) heart failure: Secondary | ICD-10-CM

## 2018-08-22 DIAGNOSIS — G4733 Obstructive sleep apnea (adult) (pediatric): Secondary | ICD-10-CM | POA: Diagnosis not present

## 2018-08-22 DIAGNOSIS — I420 Dilated cardiomyopathy: Secondary | ICD-10-CM | POA: Diagnosis not present

## 2018-08-22 DIAGNOSIS — R400 Somnolence: Secondary | ICD-10-CM | POA: Diagnosis present

## 2018-08-22 HISTORY — DX: Obstructive sleep apnea (adult) (pediatric): G47.33

## 2018-08-22 NOTE — Patient Instructions (Addendum)
Medication Instructions:  Your physician recommends that you continue on your current medications as directed. Please refer to the Current Medication list given to you today.  If you need a refill on your cardiac medications before your next appointment, please call your pharmacy.   Lab work:  If you have labs (blood work) drawn today and your tests are completely normal, you will receive your results only by: Marland Kitchen MyChart Message (if you have MyChart) OR . A paper copy in the mail If you have any lab test that is abnormal or we need to change your treatment, we will call you to review the results.  Testing/Procedures: Your physician has recommended that you have a pulmonary function test. Pulmonary Function Tests are a group of tests that measure how well air moves in and out of your lungs.  Follow-Up: 10 week follow up with Dr. Radford Pax

## 2018-08-23 ENCOUNTER — Encounter (HOSPITAL_COMMUNITY): Payer: Self-pay | Admitting: Internal Medicine

## 2018-08-23 ENCOUNTER — Ambulatory Visit (HOSPITAL_COMMUNITY)
Admission: RE | Admit: 2018-08-23 | Discharge: 2018-08-23 | Disposition: A | Discharge status: Home / Self Care | Payer: Medicare Other | Source: Ambulatory Visit | Attending: Internal Medicine | Admitting: Internal Medicine

## 2018-08-23 ENCOUNTER — Other Ambulatory Visit: Payer: Self-pay

## 2018-08-23 ENCOUNTER — Encounter: Payer: Self-pay | Admitting: Cardiology

## 2018-08-23 VITALS — BP 119/58 | HR 65 | Wt 227.0 lb

## 2018-08-23 DIAGNOSIS — I110 Hypertensive heart disease with heart failure: Secondary | ICD-10-CM | POA: Diagnosis not present

## 2018-08-23 DIAGNOSIS — I420 Dilated cardiomyopathy: Secondary | ICD-10-CM | POA: Diagnosis not present

## 2018-08-23 DIAGNOSIS — Z79899 Other long term (current) drug therapy: Secondary | ICD-10-CM | POA: Insufficient documentation

## 2018-08-23 DIAGNOSIS — J449 Chronic obstructive pulmonary disease, unspecified: Secondary | ICD-10-CM | POA: Diagnosis not present

## 2018-08-23 DIAGNOSIS — E559 Vitamin D deficiency, unspecified: Secondary | ICD-10-CM | POA: Insufficient documentation

## 2018-08-23 DIAGNOSIS — I428 Other cardiomyopathies: Secondary | ICD-10-CM | POA: Insufficient documentation

## 2018-08-23 DIAGNOSIS — I2584 Coronary atherosclerosis due to calcified coronary lesion: Secondary | ICD-10-CM | POA: Insufficient documentation

## 2018-08-23 DIAGNOSIS — E119 Type 2 diabetes mellitus without complications: Secondary | ICD-10-CM | POA: Insufficient documentation

## 2018-08-23 DIAGNOSIS — Z9221 Personal history of antineoplastic chemotherapy: Secondary | ICD-10-CM | POA: Diagnosis not present

## 2018-08-23 DIAGNOSIS — I5022 Chronic systolic (congestive) heart failure: Secondary | ICD-10-CM | POA: Diagnosis not present

## 2018-08-23 DIAGNOSIS — E785 Hyperlipidemia, unspecified: Secondary | ICD-10-CM | POA: Diagnosis not present

## 2018-08-23 DIAGNOSIS — Z853 Personal history of malignant neoplasm of breast: Secondary | ICD-10-CM | POA: Diagnosis not present

## 2018-08-23 DIAGNOSIS — M62838 Other muscle spasm: Secondary | ICD-10-CM | POA: Insufficient documentation

## 2018-08-23 DIAGNOSIS — I5042 Chronic combined systolic (congestive) and diastolic (congestive) heart failure: Secondary | ICD-10-CM | POA: Diagnosis not present

## 2018-08-23 DIAGNOSIS — Z8249 Family history of ischemic heart disease and other diseases of the circulatory system: Secondary | ICD-10-CM | POA: Insufficient documentation

## 2018-08-23 DIAGNOSIS — Z87891 Personal history of nicotine dependence: Secondary | ICD-10-CM | POA: Diagnosis not present

## 2018-08-23 DIAGNOSIS — I4819 Other persistent atrial fibrillation: Secondary | ICD-10-CM | POA: Insufficient documentation

## 2018-08-23 DIAGNOSIS — Z808 Family history of malignant neoplasm of other organs or systems: Secondary | ICD-10-CM | POA: Diagnosis not present

## 2018-08-23 DIAGNOSIS — Z9010 Acquired absence of unspecified breast and nipple: Secondary | ICD-10-CM | POA: Insufficient documentation

## 2018-08-23 DIAGNOSIS — Z923 Personal history of irradiation: Secondary | ICD-10-CM | POA: Diagnosis not present

## 2018-08-23 DIAGNOSIS — I480 Paroxysmal atrial fibrillation: Secondary | ICD-10-CM

## 2018-08-23 DIAGNOSIS — Z885 Allergy status to narcotic agent status: Secondary | ICD-10-CM | POA: Insufficient documentation

## 2018-08-23 DIAGNOSIS — Z7901 Long term (current) use of anticoagulants: Secondary | ICD-10-CM | POA: Diagnosis not present

## 2018-08-23 DIAGNOSIS — G4733 Obstructive sleep apnea (adult) (pediatric): Secondary | ICD-10-CM | POA: Diagnosis not present

## 2018-08-23 DIAGNOSIS — Z6836 Body mass index (BMI) 36.0-36.9, adult: Secondary | ICD-10-CM | POA: Diagnosis not present

## 2018-08-23 DIAGNOSIS — K219 Gastro-esophageal reflux disease without esophagitis: Secondary | ICD-10-CM | POA: Diagnosis not present

## 2018-08-23 DIAGNOSIS — E669 Obesity, unspecified: Secondary | ICD-10-CM | POA: Diagnosis not present

## 2018-08-23 DIAGNOSIS — Z7984 Long term (current) use of oral hypoglycemic drugs: Secondary | ICD-10-CM | POA: Insufficient documentation

## 2018-08-23 LAB — BASIC METABOLIC PANEL
ANION GAP: 9 (ref 5–15)
BUN: 18 mg/dL (ref 8–23)
CALCIUM: 9.7 mg/dL (ref 8.9–10.3)
CHLORIDE: 104 mmol/L (ref 98–111)
CO2: 26 mmol/L (ref 22–32)
CREATININE: 1.42 mg/dL — AB (ref 0.44–1.00)
GFR calc non Af Amer: 36 mL/min — ABNORMAL LOW (ref >60)
GFR, EST AFRICAN AMERICAN: 42 mL/min — AB (ref >60)
Glucose, Bld: 100 mg/dL — ABNORMAL HIGH (ref 70–99)
Potassium: 4.2 mmol/L (ref 3.5–5.1)
Sodium: 139 mmol/L (ref 135–145)

## 2018-08-23 NOTE — Addendum Note (Signed)
Encounter addended by: Billie Lade, CMA on: 08/23/2018 3:10 PM  Actions taken: Visit diagnoses modified, Order list changed, Diagnosis association updated

## 2018-08-23 NOTE — Procedures (Signed)
   Patient Name: Cynthia Herrera, Cynthia Herrera Study Date: 08/22/2018 Gender: Female D.O.B: Feb 10, 1946 Age (years): 41 Referring Provider: [name] MD, ABSM Height (inches): 66 Interpreting Physician: [name] MD, ABSM Weight (lbs): 228 RPSGT: Carolin Coy BMI: 37 MRN: [id number] Neck Size: 15.00  CLINICAL INFORMATION  The patient is referred for a CPAP titration to treat sleep apnea.  SLEEP STUDY TECHNIQUE  As per the AASM Manual for the Scoring of Sleep and Associated Events v2.3 (April 2016) with a hypopnea requiring 4% desaturations. The channels recorded and monitored were frontal, central and occipital EEG, electrooculogram (EOG), submentalis EMG (chin), nasal and oral airflow, thoracic and abdominal wall motion, anterior tibialis EMG, snore microphone, electrocardiogram, and pulse oximetry. Continuous positive airway pressure (CPAP) was initiated at the beginning of the study and titrated to treat sleep-disordered breathing.  MEDICATIONS  Medications self-administered by patient taken the night of the study : N/A  TECHNICIAN COMMENTS  Comments added by technician: PATIENT WAS ORDERED AS A CPAP TITRATION. Comments added by scorer: N/A  RESPIRATORY PARAMETERS  Optimal PAP Pressure (cm):15 AHI at Optimal Pressure (/hr):0.0 Overall Minimal O2 (%):84.0 Supine % at Optimal Pressure (%):100 Minimal O2 at Optimal Pressure (%):96.0  SLEEP ARCHITECTURE  The study was initiated at 10:52:34 PM and ended at 4:58:05 AM. Sleep onset time was 39.7 minutes and the sleep efficiency was 53.1%%. The total sleep time was 194 minutes. The patient spent 10.6%% of the night in stage N1 sleep, 64.9%% in stage N2 sleep, 0.3%% in stage N3 and 24.2% in REM.Stage REM latency was 60.0 minutes Wake after sleep onset was 131.8. Alpha intrusion was absent. Supine sleep was 99.48%.  CARDIAC DATA  The 2 lead EKG demonstrated sinus rhythm. The mean heart rate was 62.5 beats per minute. Other EKG findings  include: PVCs.  LEG MOVEMENT DATA  The total Periodic Limb Movements of Sleep (PLMS) were 0. The PLMS index was 0.0. A PLMS index of <15 is considered normal in adults.  IMPRESSIONS   - The optimal PAP pressure was 15 cm of water. - Central sleep apnea was not noted during this titration (CAI = 0.9/h). - Moderate oxygen desaturations were observed during this titration (min O2 = 84.0%). - The patient snored with soft snoring volume during this titration study. - 2-lead EKG demonstrated: PVCs - Clinically significant periodic limb movements were not noted during this study. Arousals associated with PLMs were rare.  DIAGNOSIS   - Obstructive Sleep Apnea (327.23 [G47.33 ICD-10])  RECOMMENDATIONS  - Trial of CPAP therapy on 15 cm H2O with a Small size Resmed Full Face Mask AirFit F30 mask and heated humidification. - Avoid alcohol, sedatives and other CNS depressants that may worsen sleep apnea and disrupt normal sleep architecture. - Sleep hygiene should be reviewed to assess factors that may improve sleep quality. - Weight management and regular exercise should be initiated or continued. - Return to Sleep Center for re-evaluation after 15 weeks of therapy  [Electronically signed] 08/23/2018 07:59 PM  [name] MD, ABSM Diplomate, American Board of Sleep Medicine

## 2018-08-23 NOTE — Patient Instructions (Signed)
Your physician has requested that you have an echocardiogram. Echocardiography is a painless test that uses sound waves to create images of your heart. It provides your doctor with information about the size and shape of your heart and how well your heart's chambers and valves are working. This procedure takes approximately one hour. There are no restrictions for this procedure.  Your physician recommends that you schedule a follow-up appointment in: 4 months  

## 2018-08-23 NOTE — Addendum Note (Signed)
Encounter addended by: Valeda Malm, RN on: 08/23/2018 2:10 PM  Actions taken: Charge Capture section accepted

## 2018-08-23 NOTE — Progress Notes (Signed)
[facility] Consult Note   Referring Physician: Dr. Radford Pax PCP: Marletta Lor, MD PCP-Cardiologist: No primary care provider on file.   HPI:  OLAMAE FERRARA is a 72 y.o. female with h/o chronic systolic HF (EF 03%), h/o breast cancer treated with chemo/radiation, COPD, obesity, persistent Afib, OSA on CPAP, and HTN referred by Dr. Lajoyce Lauber for HF evaluation.   Had chemo/radiation for breast cancer in 2002. Not sure if she got adriamycin.   Cath 2015  Left main:calcified but widely patent and bifurcates into an LAD and left circumflex JKK:[id number] but widely patent throughout its course to the apex.  It gives rise to a large bifurcating diagonal which is widely patent.  The LAD then gives rise to a second moderate sized diagonal which is patent.   ZJI:[id number] but widely patent and traverses the AV groove.  It gives rise to a large OM which bifurcates into 2 daughter branches which are widely patent.  FBP:ZWCHEN patent and gives rise to 2 acute RV marginal branches which are patent.  Distally it bifurcates into a PDA and PL branches which are patent.   LV-gram done in the RAO projection: Ejection fraction = 45%  Echo 08/2014 LVEF 50-55%  Referred to Afib clinic in 02/2018 when noted to be in Afib with decreased LVEF EF 25%. Admitted 04/13/18 for Tikosyn, but failed due to QT prolongation. Rechallenged on amiodarone (previously failed due to dizziness.)  Last seen by Dr. Rayann Heman 04/24/2018. Continued to have intermittent Afib but was noted to be in NSR that day. Complained of fatigue and poor exercise tolerance, but otherwise was doing well.   She returns today for HF follow up. She has severe OSA (AHI 47).  Last visit, she was encouraged to try CPAP for a few months to see if it helped with her fatigue with plans to try for ablation if she remained fatigued. Last night had split night titration study. Denies swelling, orthopnea or PND. No CP. No LE edema but says not  losing weight off her middle. No palpitations or AF lately. On xarelto without bleeding    Echo 06/21/18: EF 35-40%, grade 1 DD, mild MR Echo 03/03/2018 LVEF 25%, mild LAE, mild MR, PA peak pressure 29 mm Hg.  Echo 08/2014 LVEF 50-55%  Myoview 04/10/18 LVEF 41%, No reversible ischemia.  Review of systems complete and found to be negative unless listed in HPI.   Past Medical History:  Diagnosis Date  . Arthritis   . Breast cancer, left breast ([place]) 2002   "then treated w/chemo and radiation"  . Breast cancer, left breast ([place]) 07/14/2017   "tx'd w/mastectomy"  . Chronic back pain    h/o lumbar stenosis; "no problem since my OR" (`10/27/2017)  . Chronic systolic CHF (congestive heart failure) ([facility])    takes Furosemide and Aldactone daily  . Complication of anesthesia    last surgery iv med when going to sleep"burned as injected"  . COPD (chronic obstructive pulmonary disease) ([facility])    no inhalers   . DCM (dilated cardiomyopathy) ([place])    EF 15-20% ? tachycardia induced - EF 50-55% on echo 2015  . Dyspnea    with exertion  . Dysrhythmia    afib  . Gallstones   . GERD (gastroesophageal reflux disease)    once in a while;depends on what she eats  . History of bronchitis    "chronic when I smoked; no problem since I quit in 2002" (10/27/2017)  . Hyperlipidemia   .  Hypertension    takes Losartan and Metoprolol.   . Joint pain   . Muscle spasm    takes Flexeril daily as needed   . Nocturia   . OSA (obstructive sleep apnea) 08/22/2018   Severe obstructive sleep apnea with an AHI 47.3/h and no significant central sleep apnea with moderate oxygen desaturations as low as 79%  . Osteopenia   . Persistent atrial fibrillation    s/p TEE DCCV and repeat DCCV 11/14/2013  . Personal history of chemotherapy   . Personal history of colonic polyps    adenomas 03 and 08  . Personal history of radiation therapy   . Pneumonia    hx  . Seasonal allergies   . Thyroid nodule   . Type II  diabetes mellitus ([place])   . Vitamin D deficiency    takes Vit D    Current Outpatient Medications  Medication Sig Dispense Refill  . amiodarone (PACERONE) 200 MG tablet Take 1 tablet (200 mg total) by mouth daily. 90 tablet 3  . amoxicillin (AMOXIL) 500 MG capsule Take 2,000 mg by mouth See admin instructions. Take 2000 mg by mouth 1 hour prior to dental appointment  5  . atorvastatin (LIPITOR) 40 MG tablet TAKE 1 TABLET BY MOUTH EVERYDAY AT BEDTIME 90 tablet 1  . Cholecalciferol (VITAMIN D3) 2000 units capsule Take 2,000 Units by mouth daily.    . clotrimazole-betamethasone (LOTRISONE) cream Apply 1 application topically 2 (two) times daily. 30 g 0  . fexofenadine (ALLEGRA) 180 MG tablet Take 180 mg by mouth daily as needed for allergies or rhinitis.    . fluticasone (FLONASE) 50 MCG/ACT nasal spray Place 1 spray into both nostrils daily as needed for allergies.     . furosemide (LASIX) 20 MG tablet Take 1 tablet (20 mg total) by mouth 2 (two) times daily. 180 tablet 3  . glucose blood test strip Use to check CBG AC and QHS. 200 each 12  . KLOR-CON M20 20 MEQ tablet Take 1 tablet (20 mEq total) by mouth daily. 90 tablet 3  . letrozole (FEMARA) 2.5 MG tablet Take 1 tablet (2.5 mg total) by mouth daily. 90 tablet 3  . losartan (COZAAR) 25 MG tablet TAKE 1 TABLET BY MOUTH TWICE A DAY 180 tablet 3  . metFORMIN (GLUCOPHAGE) 500 MG tablet TAKE 1 TABLET TWICE A DAY WITH A MEAL 180 tablet 1  . nebivolol (BYSTOLIC) 2.5 MG tablet Take 1 tablet (2.5 mg total) by mouth daily. 90 tablet 3  . spironolactone (ALDACTONE) 25 MG tablet Take 1 tablet (25 mg total) by mouth daily. 90 tablet 3  . traMADol (ULTRAM) 50 MG tablet Take 1-2 tablets (50-100 mg total) by mouth every 6 (six) hours as needed. 20 tablet 0  . XARELTO 20 MG TABS tablet TAKE 1 TABLET EVERY DAY WITH SUPPER 30 tablet 6   No current facility-administered medications for this encounter.     Allergies  Allergen Reactions  . Tikosyn  [Dofetilide]     Prolonged QT/QTc  . Zolpidem Other (See Comments)    Hallucinations, up walking around ? Hallucinations, up walking around  . Codeine Other (See Comments)  . Codeine Phosphate Nausea And Vomiting  . Methylprednisolone Other (See Comments)    Felt really weird w the high dose oral steroid, but tolerates low doses or oral steroid Felt really weird w the high dose oral steroid, but tolerates low doses or oral steroid      Social History   Socioeconomic  History  . Marital status: Widowed    Spouse name: Not on file  . Number of children: 2  . Years of education: Not on file  . Highest education level: Not on file  Occupational History  . Occupation: retired  Scientific laboratory technician  . Financial resource strain: Not on file  . Food insecurity:    Worry: Not on file    Inability: Not on file  . Transportation needs:    Medical: Not on file    Non-medical: Not on file  Tobacco Use  . Smoking status: Former Smoker    Packs/day: 1.50    Years: 35.00    Pack years: 52.50    Types: Cigarettes    Last attempt to quit: 08/24/2001    Years since quitting: 17.0  . Smokeless tobacco: Never Used  Substance and Sexual Activity  . Alcohol use: No  . Drug use: No  . Sexual activity: Never    Birth control/protection: Post-menopausal  Lifestyle  . Physical activity:    Days per week: Not on file    Minutes per session: Not on file  . Stress: Not on file  Relationships  . Social connections:    Talks on phone: Not on file    Gets together: Not on file    Attends religious service: Not on file    Active member of club or organization: Not on file    Attends meetings of clubs or organizations: Not on file    Relationship status: Not on file  . Intimate partner violence:    Fear of current or ex partner: Not on file    Emotionally abused: Not on file    Physically abused: Not on file    Forced sexual activity: Not on file  Other Topics Concern  . Not on file  Social  History Narrative  . Not on file      Family History  Problem Relation Age of Onset  . Dementia Sister   . Diabetes Sister   . Alzheimer's disease Sister   . Diabetes Brother        x 2  . Heart disease Brother   . Irritable bowel syndrome Brother   . Liver cancer Mother   . Diabetes Mother   . Pancreatic cancer Mother   . Diabetes Father   . Heart disease Father     Vitals:   08/23/18 1341  BP: (!) 119/58  Pulse: 65  SpO2: 96%  Weight: 103 kg (227 lb)    Wt Readings from Last 3 Encounters:  08/23/18 103 kg (227 lb)  08/22/18 103.4 kg (228 lb)  08/22/18 105.1 kg (231 lb 12.8 oz)    PHYSICAL EXAM: General:  Well appearing. No resp difficulty HEENT: normal Neck: supple. no JVD. Carotids 2+ bilat; no bruits. No lymphadenopathy or thryomegaly appreciated. Cor: PMI nondisplaced. Regular rate & rhythm. No rubs, gallops or murmurs. Lungs: clear Abdomen: obese, soft, nontender, nondistended. No hepatosplenomegaly. No bruits or masses. Good bowel sounds. Extremities: no cyanosis, clubbing, rash, edema Neuro: alert & orientedx3, cranial nerves grossly intact. moves all 4 extremities w/o difficulty. Affect pleasant   ASSESSMENT & PLAN:  1. Chronic systolic CHF, NICM - Suspect tachy-mediated. EF has already improved with control of her Afib - Echo 03/03/2018 LVEF 25%, mild LAE, mild MR, PA peak pressure 29 mm Hg.  - Cath 2015 normal coronaries - EF improved to 41% on Myoview 03/2018. - Echo 05/2018: EF 35-40%, grade 1 DD - NYHA II symptoms.  -  Volume status looks good - Continue lasix 20 mg BID - Continue losartan 25 mg BID. Will defer switching to [facility] for now because she is in the donut hole - Continue spironolactone 25 mg daily.  - Continue nebivolol 2.5 mg daily. (unable to tolerate 5mg  due to severe bradycardia) - Overall improved with maintaining NSR. NYHA II. Volume status looks good. Maybe even a little dry on recent labs. Will repeat BMET today. If creatinine  still slightly elevated can drop lasix back to once a day. Will repeat echo in 4 months if EF still < 40% will switch to [facility] when out of the donut hole.  - Check BMET 2. Paroxysmal Afib - Regular on exam.  - Continue amiodarone 200 mg daily. Followed by Dr. Rayann Heman  - She is on Xarelto 20 mg daily. No bleeding  3. HTN - Blood pressure well controlled. Continue current regimen. 4. Obesity - Body mass index is 36.64 kg/m.  5. Severe OSA on CPAP - AHI 47 Has CPAP titration 06/23/18 - Saw Dr Radford Pax yesterday. 6. COPD - No wheeze.  - Stopped smoking 08/24/2001 7. H/o breast cancer treated with chemo/radiation 2002 - With recurrence and mastectomy in January 2019 - Doesn't remember ever getting Adriamycin - Follows with Dr Rosalee Kaufman, MD  2:04 PM

## 2018-08-23 NOTE — Addendum Note (Signed)
Encounter addended by: Valeda Malm, RN on: 08/23/2018 2:09 PM  Actions taken: Diagnosis association updated, Order list changed, Sign clinical note

## 2018-08-24 ENCOUNTER — Ambulatory Visit (INDEPENDENT_AMBULATORY_CARE_PROVIDER_SITE_OTHER): Payer: Medicare Other | Admitting: Internal Medicine

## 2018-08-24 DIAGNOSIS — I4819 Other persistent atrial fibrillation: Secondary | ICD-10-CM

## 2018-08-24 LAB — PULMONARY FUNCTION TEST
DL/VA % pred: 73 %
DL/VA: 3.68 ml/min/mmHg/L
DLCO unc % pred: 58 %
DLCO unc: 15.65 ml/min/mmHg
FEF 25-75 Post: 3.14 L/s
FEF 25-75 Pre: 2.82 L/s
FEF2575-%Change-Post: 11 %
FEF2575-%Pred-Post: 163 %
FEF2575-%Pred-Pre: 146 %
FEV1-%Change-Post: 1 %
FEV1-%Pred-Post: 96 %
FEV1-%Pred-Pre: 95 %
FEV1-Post: 2.32 L
FEV1-Pre: 2.28 L
FEV1FVC-%Change-Post: 3 %
FEV1FVC-%Pred-Pre: 109 %
FEV6-%Change-Post: -2 %
FEV6-%Pred-Post: 88 %
FEV6-%Pred-Pre: 90 %
FEV6-Post: 2.69 L
FEV6-Pre: 2.75 L
FEV6FVC-%Change-Post: 0 %
FEV6FVC-%Pred-Post: 104 %
FEV6FVC-%Pred-Pre: 104 %
FVC-%Change-Post: -1 %
FVC-%Pred-Post: 85 %
FVC-%Pred-Pre: 87 %
FVC-Post: 2.71 L
FVC-Pre: 2.77 L
Post FEV1/FVC ratio: 86 %
Post FEV6/FVC ratio: 100 %
Pre FEV1/FVC ratio: 83 %
Pre FEV6/FVC Ratio: 100 %
RV % pred: 84 %
RV: 1.96 L
TLC % pred: 85 %
TLC: 4.56 L

## 2018-08-24 NOTE — Progress Notes (Signed)
PFT done today. 

## 2018-08-25 ENCOUNTER — Telehealth: Payer: Self-pay | Admitting: *Deleted

## 2018-08-25 NOTE — Telephone Encounter (Signed)
-----   Message from Cynthia Margarita, MD sent at 08/23/2018  8:02 PM EDT ----- Please let patient know that they had a successful PAP titration and let DME know that orders are in EPIC.  Please set up 10 week OV with me.

## 2018-08-25 NOTE — Telephone Encounter (Signed)
Informed patient of sleep study results and patient understanding was verbalized. Patient understands her sleep study showed they had a successful PAP titration and orders are in EPIC. Upon patient request DME selection is [facility]. Patient understands she will be contacted by [facility] to set up her cpap. Patient understands to call if [facility] does not contact her with new setup in a timely manner. Patient understands they will be called once confirmation has been received from [facility] that they have received their new machine to schedule 10 week follow up appointment.  [facility] notified of new cpap order  Please add to airview Patient was grateful for the call and thanked me.

## 2018-08-28 ENCOUNTER — Telehealth: Payer: Self-pay

## 2018-08-28 DIAGNOSIS — R942 Abnormal results of pulmonary function studies: Secondary | ICD-10-CM

## 2018-08-28 NOTE — Telephone Encounter (Signed)
Left voice mail to call back 

## 2018-08-28 NOTE — Telephone Encounter (Signed)
-----   Message from Sueanne Margarita, MD sent at 08/24/2018  7:53 PM EDT ----- DLCO on PFTs has decreased and patient is on Amio - please refer to pulmonary for evaluation - Dr. Chase Caller

## 2018-08-29 NOTE — Telephone Encounter (Signed)
Spoke with the patient, she understood the PFT results and is referred to Pulmonology. Sending message to [facility].

## 2018-09-07 ENCOUNTER — Other Ambulatory Visit: Payer: Self-pay | Admitting: Internal Medicine

## 2018-09-09 ENCOUNTER — Other Ambulatory Visit: Payer: Self-pay | Admitting: Cardiology

## 2018-09-18 ENCOUNTER — Other Ambulatory Visit: Payer: Self-pay | Admitting: Cardiology

## 2018-09-19 ENCOUNTER — Ambulatory Visit (INDEPENDENT_AMBULATORY_CARE_PROVIDER_SITE_OTHER): Payer: Medicare Other | Admitting: Orthopaedic Surgery

## 2018-09-19 ENCOUNTER — Encounter (INDEPENDENT_AMBULATORY_CARE_PROVIDER_SITE_OTHER): Payer: Self-pay | Admitting: Orthopaedic Surgery

## 2018-09-19 VITALS — BP 125/75 | HR 59 | Ht 66.0 in | Wt 231.0 lb

## 2018-09-19 DIAGNOSIS — S76011A Strain of muscle, fascia and tendon of right hip, initial encounter: Secondary | ICD-10-CM | POA: Diagnosis not present

## 2018-09-19 NOTE — Progress Notes (Addendum)
Office Visit Note   Patient: Cynthia Herrera           Date of Birth: 12/13/1945           MRN: [id number] Visit Date: 09/19/2018              Requested by: No referring provider defined for this encounter. PCP: Patient, No Pcp Per   Assessment & Plan: Visit Diagnoses:  1. Tear of right gluteus medius tendon     Plan: Patient symptoms are improved with some weight loss and increase activities.  We discussed using exercise bike which should not bother her gluteus medius tendon.  Pathophysiology discussed MRI images were reviewed with patient I gave her a copy of the report she can return if she has increased symptoms.  I discussed at this point no surgery is indicated for her hip, and she can return if she has further problems.  Follow-Up Instructions: No follow-ups on file.   Orders:  No orders of the defined types were placed in this encounter.  No orders of the defined types were placed in this encounter.     Procedures: No procedures performed   Clinical Data: No additional findings.   Subjective: Chief Complaint  Patient presents with  . Right Hip - Pain, Follow-up    HPI returns.  She had an MRI scan ordered by Dr. Ellene Route which showed partial gluteus medius tear.  She is lost some weight and is rested some and states her hip pain is significantly improved.  She continues to have problems with her back and had previous 4 level decompression surgery.  She states she is going back to see Dr. Ellene Route to discuss possible lumbar surgery.  Lateral hip pain that she was having on the right has significantly improved and gives her minimal problems at this point.  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  MRI did show some evidence of lateral labral tear.  Review of Systems 14 point systems updated unchanged from 07/11/2018 other than as mentioned in HPI.   Objective: Vital Signs: BP 125/75   Pulse (!) 59   Ht  5\' 6"  (1.676 m)   Wt 231 lb (104.8 kg)   BMI 37.28 kg/m   Physical Exam  Constitutional: She is oriented to person, place, and time. She appears well-developed.  HENT:  Head: Normocephalic.  Right Ear: External ear normal.  Left Ear: External ear normal.  Eyes: Pupils are equal, round, and reactive to light.  Neck: No tracheal deviation present. No thyromegaly present.  Cardiovascular: Normal rate.  Pulmonary/Chest: Effort normal.  Abdominal: Soft.  Neurological: She is alert and oriented to person, place, and time.  Skin: Skin is warm and dry.  Psychiatric: She has a normal mood and affect. Her behavior is normal.    Ortho Exam well-healed lumbar incision patient ambulates slightly flexed position.  Negative logroll to the hip negative Trendelenburg gait today. Specialty Comments:  No specialty comments available.  Imaging: CLINICAL DATA:  Right hip pain radiating into the groin for the past 3 months.  EXAM: MR OF THE RIGHT HIP WITHOUT CONTRAST  TECHNIQUE: Multiplanar, multisequence MR imaging was performed. No intravenous contrast was administered.  COMPARISON:  Pelvic x-rays dated July 11, 2018. CT abdomen pelvis dated September 07, 2017.  FINDINGS: Bones: There is no evidence of acute fracture, dislocation or avascular necrosis. There is an eccentric 1.1 cm oval, well-defined, predominantly T2 hyperintense, T1 hypointense lesion  in the anterior inferior right femoral head. There are areas of normal fatty marrow within the lesion on T1 images. There is no surrounding marrow edema. This lesion is unchanged dating back to CT from November 2018, and has the appearance of an enchondroma. The visualized sacroiliac joints and symphysis pubis appear normal.  Articular cartilage and labrum  Articular cartilage: There are few areas of superficial cartilage irregularity overlying the anterior superior acetabulum. No subchondral signal abnormality.  Labrum:  Fluid signal extending into the substance of the superior labrum. Degenerative signal within the anterosuperior labrum.  Joint or bursal effusion  Joint effusion: No significant hip joint effusion.  Bursae: Small amount of fluid in the right greater trochanteric bursa.  Muscles and tendons  Muscles and tendons: High-grade partial tear of the right gluteus medius tendon with up to 1.6 cm of retraction. Edema within the right gluteus medius muscle. The left gluteal, bilateral hamstring, bilateral iliopsoas tendons are unremarkable. No muscle atrophy.  Other findings  Miscellaneous: The visualized internal pelvic contents appear unremarkable.  IMPRESSION: 1. High-grade partial, near complete tear of the right gluteus medius tendon. 2. Mild right greater trochanteric bursitis. 3. Abnormal signal extending into the substance of the right superior labrum, suspicious for tear.   Electronically Signed   By: Titus Dubin M.D.   On: 08/14/2018 08:20    PMFS History: Patient Active Problem List   Diagnosis Date Noted  . OSA (obstructive sleep apnea) 08/22/2018  . Trochanteric bursitis, right hip 07/14/2018  . Visit for monitoring Tikosyn therapy 03/21/2018  . Candidiasis of skin 01/05/2018  . Menopausal syndrome 01/05/2018  . Postmenopausal bleeding 01/05/2018  . Postoperative breast asymmetry 12/02/2017  . Recurrent breast cancer, left ([place]) 10/27/2017  . 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 History of left breast cancer 09/19/2017  . Malignant neoplasm of central portion of left breast in female, estrogen receptor positive ([place]) 09/01/2017  . Diabetes mellitus with peripheral vascular disease ([place]) 05/03/2016  . Diabetes mellitus with coincident hypertension ([place]) 05/03/2016  . 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 Excessive daytime sleepiness  03/12/2015  . Lumbar stenosis 03/19/2014  . Persistent atrial fibrillation   . DCM (dilated cardiomyopathy) ([place])   . Chronic combined systolic and diastolic CHF (congestive heart failure) ([place])   . Chronic anticoagulation- Xarelto 11/16/2013  . Family history of coronary artery disease 11/08/2013  . Coronary artery calcification seen on CAT scan 11/08/2013  . Spinal stenosis of lumbar region 12/18/2012  . BACK PAIN 01/08/2010  . BACK PAIN WITH RADICULOPATHY 02/28/2008  . OSTEOPENIA 05/17/2007  . Dyslipidemia 05/03/2007  . Essential hypertension 05/03/2007  . COPD 05/03/2007  . BREAST CANCER, HX OF 05/03/2007  . History of colonic polyps 05/03/2007   Past Medical History:  Diagnosis Date  . Arthritis   . Breast cancer, left breast ([place]) 2002   "then treated w/chemo and radiation"  . Breast cancer, left breast ([place]) 07/14/2017   "tx'd w/mastectomy"  . Chronic back pain    h/o lumbar stenosis; "no problem since my OR" (`10/27/2017)  . Chronic systolic CHF (congestive heart failure) ([place])   . Complication of anesthesia    last surgery iv med when going to sleep"burned as injected"  . COPD (chronic obstructive pulmonary disease) ([facility])    no inhalers   . DCM (dilated cardiomyopathy) ([place])    EF 15-20% ? tachycardia induced - EF 35-40% by echo 05/2018  .  Dyspnea    with exertion  . Gallstones   . GERD (gastroesophageal reflux disease)    once in a while;depends on what she eats  . History of bronchitis    "chronic when I smoked; no problem since I quit in 2002" (10/27/2017)  . Hyperlipidemia   . Hypertension    takes Losartan and Metoprolol.   . Joint pain   . Muscle spasm    takes Flexeril daily as needed   . Nocturia   . OSA (obstructive sleep apnea) 08/22/2018   Severe obstructive sleep apnea with an AHI 47.3/h and no significant central sleep apnea with moderate oxygen desaturations as low as 79%  . Osteopenia   . Persistent atrial fibrillation    s/p TEE DCCV and repeat  DCCV 11/14/2013 and 02/2018  . Personal history of colonic polyps    adenomas 03 and 08  . Pneumonia    hx  . Seasonal allergies   . Thyroid nodule   . Type II diabetes mellitus ([place])   . Vitamin D deficiency    takes Vit D    Family History  Problem Relation Age of Onset  . Dementia Sister   . Diabetes Sister   . Alzheimer's disease Sister   . Diabetes Brother        x 2  . Heart disease Brother   . Irritable bowel syndrome Brother   . Liver cancer Mother   . Diabetes Mother   . Pancreatic cancer Mother   . Diabetes Father   . Heart disease Father     Past Surgical History:  Procedure Laterality Date  . AXILLARY LYMPH NODE DISSECTION Left 10/2001   persistent intramammary node Archie Endo 03/09/2011  . BACK SURGERY    . BREAST BIOPSY Left 2002  . BREAST BIOPSY Right 06/2017   "node bx was negative"  . BREAST BIOPSY Left 06/2017   "positive for cancer"  . BREAST LUMPECTOMY WITH RADIOACTIVE SEED LOCALIZATION Right 08/15/2017   Procedure: RIGHT BREAST LUMPECTOMY WITH RADIOACTIVE SEED LOCALIZATION;  Surgeon: Jovita Kussmaul, MD;  Location: [facility];  Service: General;  Laterality: Right;  . CARDIAC CATHETERIZATION  2015  . CARDIOVERSION N/A 11/12/2013   Procedure: CARDIOVERSION;  Surgeon: Thayer Headings, MD;  [facility];  Service: Cardiovascular;  Laterality: N/A;  10:08  Dr. Marissa Nestle, anesthesia present, [place]   60mg ,  propofol 50mg , IV for elective cardioversion....Dr. Cathie Olden delievered synch 120 joules with successful cardioversion to NSR  . CARDIOVERSION N/A 11/12/2013   Procedure: CARDIOVERSION;  Surgeon: Thayer Headings, MD;  Location: [facility];  Service: Cardiovascular;  Laterality: N/A;  . CARDIOVERSION N/A 11/14/2013   Procedure: CARDIOVERSION (BEDSIDE);  Surgeon: Larey Dresser, MD;  Location: [place];  Service: Cardiovascular;  Laterality: N/A;  . CARDIOVERSION N/A 02/22/2018   Procedure: CARDIOVERSION;  Surgeon: Sueanne Margarita, MD;  Location: [facility] ENDOSCOPY;   Service: Cardiovascular;  Laterality: N/A;  . CATARACT EXTRACTION W/ INTRAOCULAR LENS  IMPLANT, BILATERAL Bilateral ~ 2016  . COLONOSCOPY  2003, September 2008, April 01, 2011   adenomas 03 and 08, polyp 12, diverticulosis  . COLONOSCOPY WITH PROPOFOL N/A 11/16/2016   Procedure: COLONOSCOPY WITH PROPOFOL;  Surgeon: Gatha Mayer, MD;  Location: [facility] ENDOSCOPY;  Service: Endoscopy;  Laterality: N/A;  . JOINT REPLACEMENT    . LAPAROSCOPIC CHOLECYSTECTOMY  2004  . LEFT AND RIGHT HEART CATHETERIZATION WITH CORONARY ANGIOGRAM N/A 02/01/2014   Procedure: LEFT AND RIGHT HEART CATHETERIZATION WITH CORONARY ANGIOGRAM;  Surgeon: Eber Hong  Radford Pax, MD;  Location: [facility] CATH LAB;  Service: Cardiovascular;  Laterality: N/A;  . LUMBAR LAMINECTOMY/DECOMPRESSION MICRODISCECTOMY N/A 03/19/2014   Procedure: LUMBAR LAMINECTOMY/DECOMPRESSION MICRODISCECTOMY 4 LEVEL;  Surgeon: Kristeen Miss, MD;  Location: [facility] NEURO ORS;  Service: Neurosurgery;  Laterality: N/A;  L1-2 L2-3 L3-4 L4-5 Laminectomy/Foraminotomy  . MASS EXCISION Left 08/15/2017   Procedure: EXCISION OF LEFT BREAST MASS;  Surgeon: Jovita Kussmaul, MD;  Location: [facility];  Service: General;  Laterality: Left;  [place] MASTECTOMY Left 10/27/2017  . MASTECTOMY PARTIAL / LUMPECTOMY W/ AXILLARY LYMPHADENECTOMY  05/08/2001   Archie Endo 03/09/2011  . PORT-A-CATH REMOVAL  2003  . PORTA CATH INSERTION  2002  . TEE WITHOUT CARDIOVERSION N/A 11/12/2013   Procedure: TRANSESOPHAGEAL ECHOCARDIOGRAM (TEE);  Surgeon: Thayer Headings, MD;  Location: [facility];  Service: Cardiovascular;  Laterality: N/A;  pt b/p low, pt buccal membranes very dry, lips scapped, pt c/o thirst. NPO since MN and iv FLUIDS TOTAL INFUSING AT TOTAL 20ML HR....  Dr. Cathie Olden order allow NS to bolus during procedure....very dry,NS bolus 250 ml total..pt responding well to meds..  . TOTAL KNEE ARTHROPLASTY Left 2006  . TOTAL KNEE ARTHROPLASTY Right 05/10/2016   Procedure: RIGHT TOTAL KNEE ARTHROPLASTY;  Surgeon: Gaynelle Arabian, MD;  Location: [facility] ORS;  Service: Orthopedics;  Laterality: Right;  With adductor block  . TOTAL MASTECTOMY Left 10/27/2017   Procedure: LEFT MASTECTOMY;  Surgeon: Jovita Kussmaul, MD;  Location: [place];  Service: General;  Laterality: Left;  . TUBAL LIGATION     Social History   Occupational History  . Occupation: retired  Tobacco Use  . Smoking status: Former Smoker    Packs/day: 1.50    Years: 35.00    Pack years: 52.50    Types: Cigarettes    Last attempt to quit: 08/24/2001    Years since quitting: 17.0  . Smokeless tobacco: Never Used  Substance and Sexual Activity  . Alcohol use: No  . Drug use: No  . Sexual activity: Never    Birth control/protection: Post-menopausal

## 2018-09-20 ENCOUNTER — Encounter: Payer: Self-pay | Admitting: Internal Medicine

## 2018-09-20 ENCOUNTER — Ambulatory Visit (INDEPENDENT_AMBULATORY_CARE_PROVIDER_SITE_OTHER): Payer: Medicare Other | Admitting: Internal Medicine

## 2018-09-20 VITALS — BP 106/52 | Ht 66.0 in | Wt 228.0 lb

## 2018-09-20 DIAGNOSIS — Z9229 Personal history of other drug therapy: Secondary | ICD-10-CM

## 2018-09-20 DIAGNOSIS — R0602 Shortness of breath: Secondary | ICD-10-CM | POA: Diagnosis not present

## 2018-09-20 DIAGNOSIS — Z87891 Personal history of nicotine dependence: Secondary | ICD-10-CM | POA: Diagnosis not present

## 2018-09-20 DIAGNOSIS — R942 Abnormal results of pulmonary function studies: Secondary | ICD-10-CM | POA: Diagnosis not present

## 2018-09-20 LAB — NITRIC OXIDE: NITRIC OXIDE: 21

## 2018-09-20 NOTE — Addendum Note (Signed)
Addended by: Parke Poisson E on: 09/20/2018 01:12 PM   Modules accepted: Orders

## 2018-09-20 NOTE — Progress Notes (Signed)
Subjective:    Patient ID: Cynthia Herrera, female    DOB: 02-11-1946, 72 y.o.   MRN: [id number]  PCP Patient, No Pcp Per   HPI IOV 09/20/2018  Chief Complaint  Patient presents with  . Consult    referred by [name] MD for abnormal PFTs. pt notes some dyspnea with exertion that is at baseline.   72 year old obese lady with chronic systolic heart failure EF 35% and atrial fibrillation on chronic amiodarone therapy 200 mg a day for the last several years according to history.  She tells me that she has insidious onset of shortness of breath for the last few years.  It has been so gradual in onset she does not remember when but she believes it is a few years.  Definitely not several years.  She feels its insidious worsening of shortness of breath.  Initially she denied any associated cough but then later she said for the last several months has had some cough occasional with some yellow sputum.  But definitely her shortness of breath is progressive very mildly and present on exertion relieved by rest no associated chest pain.  No orthopnea proximal nocturnal dyspnea or worsening edema or change in weight.  No fever or chills.  She had pulmonary function test that showed isolated reduction in diffusion capacity.  I personally visualized the scan.  Rest of lab work-up as mentioned below.  Of note, in the last 3 weeks she has had acute worsening of chronic back pain but she believes this is unrelated to her dyspnea.   Exam nitric oxide today in the office: 09/20/2018 - 21 ppb and normal  She has had 2 CT scans of the chest 1 in 2014 December that I personally visualized and shows diffuse groundglass opacities.  She has another CT scan in #2018 there is more recent that I personally visualized and this shows essentially clear lung fields except scattered lung nodules.  She had a cardiac stress test June 2019 that is reported as low risk  Echocardiogram August 2019 with chronic systolic  heart failure with EF 35% and grade 1 diastolic dysfunction  Blood work October 2019 with a creatinine 1.42 mg percent and a low GFR of 36 and a hemoglobin of 12.5 g% that is all stable in March 2019   Results for ESTALENE, BERGEY (MRN [id number]) as of 09/20/2018 12:08  Ref. Range 08/24/2018 08:49  FVC-Pre Latest Units: L 2.77  FVC-%Pred-Pre Latest Units: % 87  FEV1-Pre Latest Units: L 2.28  FEV1-%Pred-Pre Latest Units: % 95  Pre FEV1/FVC ratio Latest Units: % 83  FEV1FVC-%Pred-Pre Latest Units: % 109   Results for BAYAN, HEDSTROM (MRN [id number]) as of 09/20/2018 12:08  Ref. Range 08/24/2018 08:49  TLC Latest Units: L 4.56  TLC % pred Latest Units: % 85   Results for MAFALDA, MCGINNISS (MRN [id number]) as of 09/20/2018 12:08  Ref. Range 08/24/2018 08:49  DLCO unc Latest Units: ml/min/mmHg 15.65  DLCO unc % pred Latest Units: % 58    has a past medical history of Arthritis, Breast cancer, left breast ([place]) (2002), Breast cancer, left breast ([place]) (07/14/2017), Chronic back pain, Chronic systolic CHF (congestive heart failure) ([place]), Complication of anesthesia, COPD (chronic obstructive pulmonary disease) ([place]), DCM (dilated cardiomyopathy) ([place]), Dyspnea, Gallstones, GERD (gastroesophageal reflux disease), History of bronchitis, Hyperlipidemia, Hypertension, Joint pain, Muscle spasm, Nocturia, OSA (obstructive sleep apnea) (08/22/2018), Osteopenia, Persistent atrial fibrillation, Personal history of colonic polyps, Pneumonia, Seasonal allergies, Thyroid nodule, Type II  diabetes mellitus ([place]), and Vitamin D deficiency.   reports that she quit smoking about 17 years ago. Her smoking use included cigarettes. She has a 52.50 pack-year smoking history. She has never used smokeless tobacco.  Past Surgical History:  Procedure Laterality Date  . AXILLARY LYMPH NODE DISSECTION Left 10/2001   persistent intramammary node Archie Endo 03/09/2011  . BACK SURGERY    . BREAST BIOPSY Left 2002  .  BREAST BIOPSY Right 06/2017   "node bx was negative"  . BREAST BIOPSY Left 06/2017   "positive for cancer"  . BREAST LUMPECTOMY WITH RADIOACTIVE SEED LOCALIZATION Right 08/15/2017   Procedure: RIGHT BREAST LUMPECTOMY WITH RADIOACTIVE SEED LOCALIZATION;  Surgeon: Jovita Kussmaul, MD;  Location: [place];  Service: General;  Laterality: Right;  . CARDIAC CATHETERIZATION  2015  . CARDIOVERSION N/A 11/12/2013   Procedure: CARDIOVERSION;  Surgeon: Thayer Headings, MD;  Location: [facility] ENDOSCOPY;  Service: Cardiovascular;  Laterality: N/A;  10:08  Dr. Marissa Nestle, anesthesia present, [place]   60mg ,  propofol 50mg , IV for elective cardioversion....Dr. Cathie Olden delievered synch 120 joules with successful cardioversion to NSR  . CARDIOVERSION N/A 11/12/2013   Procedure: CARDIOVERSION;  Surgeon: Thayer Headings, MD;  Location: [place];  Service: Cardiovascular;  Laterality: N/A;  . CARDIOVERSION N/A 11/14/2013   Procedure: CARDIOVERSION (BEDSIDE);  Surgeon: Larey Dresser, MD;  Location: [place];  Service: Cardiovascular;  Laterality: N/A;  . CARDIOVERSION N/A 02/22/2018   Procedure: CARDIOVERSION;  Surgeon: Sueanne Margarita, MD;  Location: [facility] ENDOSCOPY;  Service: Cardiovascular;  Laterality: N/A;  . CATARACT EXTRACTION W/ INTRAOCULAR LENS  IMPLANT, BILATERAL Bilateral ~ 2016  . COLONOSCOPY  2003, September 2008, April 01, 2011   adenomas 03 and 08, polyp 12, diverticulosis  . COLONOSCOPY WITH PROPOFOL N/A 11/16/2016   Procedure: COLONOSCOPY WITH PROPOFOL;  Surgeon: Gatha Mayer, MD;  Location: [facility] ENDOSCOPY;  Service: Endoscopy;  Laterality: N/A;  . JOINT REPLACEMENT    . LAPAROSCOPIC CHOLECYSTECTOMY  2004  . LEFT AND RIGHT HEART CATHETERIZATION WITH CORONARY ANGIOGRAM N/A 02/01/2014   Procedure: LEFT AND RIGHT HEART CATHETERIZATION WITH CORONARY ANGIOGRAM;  Surgeon: Sueanne Margarita, MD;  Location: [place] CATH LAB;  Service: Cardiovascular;  Laterality: N/A;  . LUMBAR LAMINECTOMY/DECOMPRESSION MICRODISCECTOMY N/A 03/19/2014     Procedure: LUMBAR LAMINECTOMY/DECOMPRESSION MICRODISCECTOMY 4 LEVEL;  Surgeon: Kristeen Miss, MD;  Location: [place] NEURO ORS;  Service: Neurosurgery;  Laterality: N/A;  L1-2 L2-3 L3-4 L4-5 Laminectomy/Foraminotomy  . MASS EXCISION Left 08/15/2017   Procedure: EXCISION OF LEFT BREAST MASS;  Surgeon: Jovita Kussmaul, MD;  Location: [place];  Service: General;  Laterality: Left;  Marland Kitchen MASTECTOMY Left 10/27/2017  . MASTECTOMY PARTIAL / LUMPECTOMY W/ AXILLARY LYMPHADENECTOMY  05/08/2001   Archie Endo 03/09/2011  . PORT-A-CATH REMOVAL  2003  . PORTA CATH INSERTION  2002  . TEE WITHOUT CARDIOVERSION N/A 11/12/2013   Procedure: TRANSESOPHAGEAL ECHOCARDIOGRAM (TEE);  Surgeon: Thayer Headings, MD;  Location: [place];  Service: Cardiovascular;  Laterality: N/A;  pt b/p low, pt buccal membranes very dry, lips scapped, pt c/o thirst. NPO since MN and iv FLUIDS TOTAL INFUSING AT TOTAL 20ML HR....  Dr. Cathie Olden order allow NS to bolus during procedure....very dry,NS bolus 250 ml total..pt responding well to meds..  . TOTAL KNEE ARTHROPLASTY Left 2006  . TOTAL KNEE ARTHROPLASTY Right 05/10/2016   Procedure: RIGHT TOTAL KNEE ARTHROPLASTY;  Surgeon: Gaynelle Arabian, MD;  Location: [facility] ORS;  Service: Orthopedics;  Laterality: Right;  With adductor block  . TOTAL MASTECTOMY Left  10/27/2017   Procedure: LEFT MASTECTOMY;  Surgeon: Jovita Kussmaul, MD;  Location: [place];  Service: General;  Laterality: Left;  . TUBAL LIGATION      Allergies  Allergen Reactions  . Tikosyn [Dofetilide]     Prolonged QT/QTc  . Zolpidem Other (See Comments)    Hallucinations, up walking around ? Hallucinations, up walking around  . Codeine Other (See Comments)  . Codeine Phosphate Nausea And Vomiting  . Methylprednisolone Other (See Comments)    Felt really weird w the high dose oral steroid, but tolerates low doses or oral steroid Felt really weird w the high dose oral steroid, but tolerates low doses or oral steroid    Immunization History   Administered Date(s) Administered  . Influenza Split 09/29/2011, 07/20/2012  . Influenza Whole 08/14/2008  . Influenza,inj,Quad PF,6+ Mos 10/23/2013, 08/20/2014, 08/14/2015, 08/27/2016, 08/09/2018  . Pneumococcal Conjugate-13 10/23/2013  . Pneumococcal Polysaccharide-23 06/18/2011  . Tdap 06/18/2011  . Zoster 07/20/2012    Family History  Problem Relation Age of Onset  . Dementia Sister   . Diabetes Sister   . Alzheimer's disease Sister   . Diabetes Brother        x 2  . Heart disease Brother   . Irritable bowel syndrome Brother   . Liver cancer Mother   . Diabetes Mother   . Pancreatic cancer Mother   . Diabetes Father   . Heart disease Father      Current Outpatient Medications:  .  amiodarone (PACERONE) 200 MG tablet, Take 1 tablet (200 mg total) by mouth daily., Disp: 90 tablet, Rfl: 3 .  amoxicillin (AMOXIL) 500 MG capsule, Take 2,000 mg by mouth See admin instructions. Take 2000 mg by mouth 1 hour prior to dental appointment, Disp: , Rfl: 5 .  atorvastatin (LIPITOR) 40 MG tablet, TAKE 1 TABLET BY MOUTH EVERYDAY AT BEDTIME, Disp: 90 tablet, Rfl: 1 .  Cholecalciferol (VITAMIN D3) 2000 units capsule, Take 2,000 Units by mouth daily., Disp: , Rfl:  .  clotrimazole-betamethasone (LOTRISONE) cream, Apply 1 application topically 2 (two) times daily., Disp: 30 g, Rfl: 0 .  fexofenadine (ALLEGRA) 180 MG tablet, Take 180 mg by mouth daily as needed for allergies or rhinitis., Disp: , Rfl:  .  fluticasone (FLONASE) 50 MCG/ACT nasal spray, Place 1 spray into both nostrils daily as needed for allergies. , Disp: , Rfl:  .  furosemide (LASIX) 20 MG tablet, TAKE 1 TABLET BY MOUTH TWICE A DAY, Disp: 180 tablet, Rfl: 2 .  glucose blood test strip, Use to check CBG AC and QHS., Disp: 200 each, Rfl: 12 .  KLOR-CON M20 20 MEQ tablet, Take 1 tablet (20 mEq total) by mouth daily., Disp: 90 tablet, Rfl: 3 .  letrozole (FEMARA) 2.5 MG tablet, Take 1 tablet (2.5 mg total) by mouth daily., Disp:  90 tablet, Rfl: 3 .  losartan (COZAAR) 25 MG tablet, TAKE 1 TABLET BY MOUTH TWICE A DAY, Disp: 180 tablet, Rfl: 3 .  metFORMIN (GLUCOPHAGE) 500 MG tablet, TAKE 1 TABLET TWICE A DAY WITH A MEAL, Disp: 180 tablet, Rfl: 1 .  nebivolol (BYSTOLIC) 2.5 MG tablet, Take 1 tablet (2.5 mg total) by mouth daily., Disp: 90 tablet, Rfl: 3 .  spironolactone (ALDACTONE) 25 MG tablet, TAKE 1 TABLET BY MOUTH EVERY DAY, Disp: 90 tablet, Rfl: 2 .  traMADol (ULTRAM) 50 MG tablet, Take 1-2 tablets (50-100 mg total) by mouth every 6 (six) hours as needed., Disp: 20 tablet, Rfl: 0 .  XARELTO  20 MG TABS tablet, TAKE 1 TABLET EVERY DAY WITH SUPPER, Disp: 30 tablet, Rfl: 6    Review of Systems  Constitutional: Negative for fever and unexpected weight change.  HENT: Negative for congestion, dental problem, ear pain, nosebleeds, postnasal drip, rhinorrhea, sinus pressure, sneezing, sore throat and trouble swallowing.   Eyes: Negative for redness and itching.  Respiratory: Negative for cough, chest tightness, shortness of breath and wheezing.   Cardiovascular: Negative for palpitations and leg swelling.  Gastrointestinal: Negative for nausea and vomiting.  Genitourinary: Negative for dysuria.  Musculoskeletal: Negative for joint swelling.  Skin: Negative for rash.  Neurological: Negative for headaches.  Hematological: Does not bruise/bleed easily.  Psychiatric/Behavioral: Negative for dysphoric mood. The patient is not nervous/anxious.        Objective:   Physical Exam  Constitutional: She is oriented to person, place, and time. She appears well-developed and well-nourished. No distress.  HENT:  Head: Normocephalic and atraumatic.  Right Ear: External ear normal.  Left Ear: External ear normal.  Mouth/Throat: Oropharynx is clear and moist. No oropharyngeal exudate.  Eyes: Pupils are equal, round, and reactive to light. Conjunctivae and EOM are normal. Right eye exhibits no discharge. Left eye exhibits no  discharge. No scleral icterus.  Neck: Normal range of motion. Neck supple. No JVD present. No tracheal deviation present. No thyromegaly present.  Cardiovascular: Normal rate, regular rhythm, normal heart sounds and intact distal pulses. Exam reveals no gallop and no friction rub.  No murmur heard. Pulmonary/Chest: Effort normal and breath sounds normal. No respiratory distress. She has no wheezes. She has no rales. She exhibits no tenderness.  ?  Basal wheezing crackles  Abdominal: Soft. Bowel sounds are normal. She exhibits no distension and no mass. There is no tenderness. There is no rebound and no guarding.  Visceral obesity present with chronic back scars  Musculoskeletal: Normal range of motion. She exhibits no edema or tenderness.  Scar in the back and DJD  Lymphadenopathy:    She has no cervical adenopathy.  Neurological: She is alert and oriented to person, place, and time. She has normal reflexes. No cranial nerve deficit. She exhibits normal muscle tone. Coordination normal.  Skin: Skin is warm and dry. No rash noted. She is not diaphoretic. No erythema. No pallor.  Psychiatric: She has a normal mood and affect. Her behavior is normal. Judgment and thought content normal.  Vitals reviewed.   Vitals:   09/20/18 1202  BP: (!) 106/52  SpO2: 95%  Weight: 228 lb (103.4 kg)  Height: 5\' 6"  (1.676 m)    Estimated body mass index is 36.8 kg/m as calculated from the following:   Height as of this encounter: 5\' 6"  (1.676 m).   Weight as of this encounter: 228 lb (103.4 kg).       Assessment & Plan:     ICD-10-CM   1. Shortness of breath R06.02   2. Abnormal pulmonary function test R94.2   3. History of amiodarone therapy Z92.29   4. Stopped smoking with greater than 40 pack year history Z87.891     Based on a history of amiodarone therapy and isolated reduction in diffusion capacity need to rule out any toxic effects of amiodarone on her lungs although independently she  could be at risk for interstitial lung disease and with previous smoking emphysema needs to be ruled out  Best to get a high-resolution CT scan of the chest and then decide accordingly.     SIGNATURE    Dr.  Brand Males, M.D., F.C.C.P,  Pulmonary and Critical Care Medicine Staff Physician, [facility] Director - Interstitial Lung Disease  Program  Pulmonary [place] at [place], [place], [address]  Pager: [phone], If no answer or between  15:00h - 7:00h: call [phone] Telephone: [phone]  12:31 PM 09/20/2018

## 2018-09-20 NOTE — Patient Instructions (Addendum)
ICD-10-CM   1. Shortness of breath R06.02   2. Abnormal pulmonary function test R94.2   3. History of amiodarone therapy Z92.29   4. Stopped smoking with greater than 40 pack year history Z87.891    Need to rule out any pulmonary inflammation  Need to rule out emphysema from previous smoking  PLAN Do HRCT supine and prone  - will call with results to decide next step

## 2018-09-28 ENCOUNTER — Other Ambulatory Visit: Payer: Self-pay | Admitting: Cardiology

## 2018-09-28 MED ORDER — KLOR-CON M20 20 MEQ PO TBCR: 20.0000 meq | EXTENDED_RELEASE_TABLET | Freq: Every day | ORAL | 3 refills | Status: DC

## 2018-10-03 ENCOUNTER — Ambulatory Visit (INDEPENDENT_AMBULATORY_CARE_PROVIDER_SITE_OTHER)
Admission: RE | Admit: 2018-10-03 | Discharge: 2018-10-03 | Disposition: A | Discharge status: Home / Self Care | Payer: Medicare Other | Source: Ambulatory Visit | Attending: Adult Health | Admitting: Adult Health

## 2018-10-03 DIAGNOSIS — R0602 Shortness of breath: Secondary | ICD-10-CM | POA: Diagnosis not present

## 2018-10-05 ENCOUNTER — Telehealth: Payer: Self-pay | Admitting: Internal Medicine

## 2018-10-05 DIAGNOSIS — R0602 Shortness of breath: Secondary | ICD-10-CM

## 2018-10-05 NOTE — Telephone Encounter (Signed)
  Let [name] know that there might be some lung inflammation on CT. Unclear cause  PLAN Serum: ESR, ACE, ANA, DS-DNA, RF, anti-CCP,  ANCA screen, MPO, PR-3, Total CK,  Aldolase,  ENP Panel ( ensure includes -> scl-70, ssA, ssB, anti-RNP, anti-JO-1, anti-smith antibody, anti-centromere, anti-histone) , , & Hypersensitivity Pneumonitis Panel  Do above 1 week before appt atleast  ILD questionnaire  Return for followup - ILD clinic to discuss above results  Ct Chest High Resolution  Result Date: 10/03/2018 CLINICAL DATA:  72 year old female with history of shortness of breath. Former smoker. History of amiodarone use. Evaluate for interstitial lung disease. EXAM: CT CHEST WITHOUT CONTRAST TECHNIQUE: Multidetector CT imaging of the chest was performed following the standard protocol without intravenous contrast. High resolution imaging of the lungs, as well as inspiratory and expiratory imaging, was performed. COMPARISON:  Chest CT 09/07/2017. FINDINGS: Cardiovascular: Heart size is normal. There is no significant pericardial fluid, thickening or pericardial calcification. There is aortic atherosclerosis, as well as atherosclerosis of the great vessels of the mediastinum and the coronary arteries, including calcified atherosclerotic plaque in the left main, left anterior descending, left circumflex and right coronary arteries. Mediastinum/Nodes: No pathologically enlarged mediastinal or hilar lymph nodes. Please note that accurate exclusion of hilar adenopathy is limited on noncontrast CT scans. Esophagus is unremarkable in appearance. No axillary lymphadenopathy. Surgical clips in the left axilla from prior lymph node dissection. Lungs/Pleura: High-resolution images demonstrates some very mild rather diffuse ground-glass attenuation throughout the lungs bilaterally. No associated septal thickening, subpleural reticulation, parenchymal banding, traction bronchiectasis or frank honeycombing.  Inspiratory and expiratory imaging demonstrates some mild air trapping indicative of mild small airways disease. No acute consolidative airspace disease. No pleural effusions. Upper Abdomen: Aortic atherosclerosis. Spleen is incompletely imaged, but is clearly enlarged measuring up to 16.7 x 6.0 cm on axial images. Status post cholecystectomy. Musculoskeletal: Status post left modified radical mastectomy, new compared to the prior examination. Postoperative fluid collection associated with the left pectoral muscle measuring approximately 2.6 x 10.4 cm (axial image 71 of series 2). There are no aggressive appearing lytic or blastic lesions noted in the visualized portions of the skeleton. IMPRESSION: 1. Extremely subtle ground-glass attenuation noted throughout the lungs bilaterally. This is nonspecific. The possibility of early interstitial lung disease is not entirely excluded. If there's persistent clinical concern for interstitial lung disease, a repeat high-resolution chest CT would be recommended in 12 months to assess for temporal changes in the appearance of the lung parenchyma. 2. No imaging stigmata of amiodarone induced pulmonary toxicity. 3. Mild air trapping, indicative of mild small airways disease. 4. Aortic atherosclerosis, in addition to left main and 3 vessel coronary artery disease. Assessment for potential risk factor modification, dietary therapy or pharmacologic therapy may be warranted, if clinically indicated. 5. Splenomegaly. 6. New postoperative changes of left modified radical mastectomy with postoperative fluid collection associated with the left pectoralis musculature measuring 2.6 x 10.4 cm, likely a postoperative seroma. Aortic Atherosclerosis (ICD10-I70.0). Electronically Signed   By: Vinnie Langton M.D.   On: 10/03/2018 20:23

## 2018-10-05 NOTE — Telephone Encounter (Signed)
Called and spoke with pt letting her know the results of the HRCT. Stated to pt we needed her to get labwork done about 1 week prior to next OV. Per pt, she does not have a follow up OV scheduled. First avail appt in ILD clinc is not until 11/02/17. Due to the month of Dec 2019 being almost half over, scheduled pt's follow up 11/02/17 at 12pm.  Stated to pt that we needed her to get labwork done at least 1 week prior to [place]. Stated to pt she could come to our office either 1/1 or 1/2 to get labwork done and stated to her that she did not need to make an appt prior. Pt expressed understanding.  Also stated to pt I was going to mail questionnaire packet to her address for her to complete and then bring with her when she came for OV. Pt expressed understanding. Labs have been placed and questionnaire packet has been placed in the mail. Nothing further needed.

## 2018-10-12 ENCOUNTER — Other Ambulatory Visit: Payer: Self-pay | Admitting: Cardiology

## 2018-10-16 ENCOUNTER — Telehealth: Payer: Self-pay

## 2018-10-16 NOTE — Telephone Encounter (Signed)
   [facility] Pre-operative Risk Assessment    Request for surgical clearance:  1. What type of surgery is being performed?  L3-4 Translaminar lumbar epidural steroid injection   2. When is this surgery scheduled?  11/03/2018   3. What type of clearance is required (medical clearance vs. Pharmacy clearance to hold med vs. Both)?  both  4. Are there any medications that need to be held prior to surgery and how long? xarelto   5. Practice name and name of physician performing surgery?  [facility]   6. What is your office phone number [phone]    7.   What is your office fax number [phone]  8.   Anesthesia type (None, local, MAC, general) ?  local   Cynthia Herrera 10/16/2018, 11:20 AM  _________________________________________________________________   (provider comments below)

## 2018-10-17 NOTE — Telephone Encounter (Signed)
   Primary Cardiologist: [name], MD  CHF: Dr. Haroldine Laws  Chart reviewed as part of pre-operative protocol coverage. Patient was contacted 10/17/2018 in reference to pre-operative risk assessment for pending surgery as outlined below.  Cynthia Herrera was last seen on 08/23/18 by Dr. Haroldine Laws and 08/22/18 by Dr. Radford Pax. H/o chronic sCHF/NCIM suspected tachy-mediated, breast Ca, COPD, persistent afib, obesity, HTN. EF began to improve with restoration of NSR. F/u echo planned. Since that day, Cynthia Herrera denies any acute cardiac changes. She is seeing pulm for dyspnea, but otherwise denies any interim atrial fib. Therefore, no specific acute concern about holding anticoagulation for ESI.  I will forward to pharm for input how long to hold Xarelto prior to procedure, then final recs can be routed to surgeon.  Charlie Pitter, PA-C 10/17/2018, 4:07 PM

## 2018-10-19 NOTE — Telephone Encounter (Signed)
Patient with diagnosis of atrial fibrillation on Xarelto for anticoagulation.    Procedure: L3-4 translaminar lumbar epidural steroid injection Date of procedure: 11/03/2018  CHADS2-VASc score of  6 (CHF, HTN, AGE, DM2,, CAD,, female)  CrCl 57.6 Platelet count 335  Per office protocol, patient can hold Xarelto for 3 days prior to procedure.    If not bridging, patient should restart Xarelto on the evening of procedure or day after, at discretion of procedure MD

## 2018-10-19 NOTE — Telephone Encounter (Signed)
   Primary Cardiologist: [name], MD  Chart reviewed as part of pre-operative protocol coverage. Given past medical history and time since last visit, based on ACC/AHA guidelines, Cynthia Herrera would be at acceptable risk for the planned procedure without further cardiovascular testing.   I will route this recommendation to the requesting party via Epic fax function and remove from pre-op pool.  Please call with questions.   Case reviewed by our clinical pharmacist, "Per office protocol, patient can hold Xarelto for 3 days prior to procedure.  If not bridging, patient should restart Xarelto on the evening of procedure or day after, at discretion of procedure MD"  Almyra Deforest, PA 10/19/2018, 2:29 PM

## 2018-10-19 NOTE — Telephone Encounter (Signed)
Called patient, advised how to hold Xarelto. Patient verbalized understanding. Clearance sent via Epic by PA.

## 2018-10-26 ENCOUNTER — Telehealth (HOSPITAL_COMMUNITY): Payer: Self-pay

## 2018-10-26 ENCOUNTER — Other Ambulatory Visit: Payer: Medicare Other

## 2018-10-26 DIAGNOSIS — R0602 Shortness of breath: Secondary | ICD-10-CM

## 2018-10-26 DIAGNOSIS — I5022 Chronic systolic (congestive) heart failure: Secondary | ICD-10-CM

## 2018-10-26 NOTE — Addendum Note (Signed)
Addended by: Suzzanne Cloud E on: 10/26/2018 02:06 PM   Modules accepted: Orders

## 2018-10-27 LAB — ANTI-SMITH ANTIBODY: ENA SM Ab Ser-aCnc: <1 AI

## 2018-10-27 LAB — ANTI-JO 1 ANTIBODY, IGG: Anti JO-1: <0.2 AI (ref 0.0–0.9)

## 2018-10-27 LAB — RNP ANTIBODIES: ENA RNP Ab: <0.2 AI (ref 0.0–0.9)

## 2018-10-27 NOTE — Telephone Encounter (Signed)
Opened in error

## 2018-10-31 ENCOUNTER — Other Ambulatory Visit: Payer: Self-pay

## 2018-10-31 ENCOUNTER — Ambulatory Visit: Payer: Medicare Other | Attending: Neurological Surgery | Admitting: Physical Therapy

## 2018-10-31 DIAGNOSIS — M5440 Lumbago with sciatica, unspecified side: Secondary | ICD-10-CM | POA: Insufficient documentation

## 2018-10-31 DIAGNOSIS — M6281 Muscle weakness (generalized): Secondary | ICD-10-CM | POA: Insufficient documentation

## 2018-10-31 DIAGNOSIS — G8929 Other chronic pain: Secondary | ICD-10-CM | POA: Diagnosis present

## 2018-10-31 DIAGNOSIS — R293 Abnormal posture: Secondary | ICD-10-CM | POA: Diagnosis present

## 2018-10-31 DIAGNOSIS — R262 Difficulty in walking, not elsewhere classified: Secondary | ICD-10-CM | POA: Diagnosis present

## 2018-10-31 LAB — SJOGREN'S SYNDROME ANTIBODS(SSA + SSB)
SSA (Ro) (ENA) Antibody, IgG: <1 AI
SSB (La) (ENA) Antibody, IgG: <1 AI

## 2018-10-31 LAB — HYPERSENSITIVITY PNUEMONITIS PROFILE
ASPERGILLUS FUMIGATUS: NEGATIVE
Faenia retivirgula: NEGATIVE
Pigeon Serum: NEGATIVE
S. VIRIDIS: NEGATIVE
T. CANDIDUS: NEGATIVE
T. VULGARIS: NEGATIVE

## 2018-10-31 LAB — ANA: Anti Nuclear Antibody(ANA): NEGATIVE

## 2018-10-31 LAB — RHEUMATOID FACTOR: Rheumatoid fact SerPl-aCnc: <14 IU/mL (ref <14)

## 2018-10-31 LAB — CYCLIC CITRUL PEPTIDE ANTIBODY, IGG: Cyclic Citrullin Peptide Ab: <16 UNITS

## 2018-10-31 LAB — CK TOTAL AND CKMB (NOT AT ARMC)
CK, MB: 0.8 ng/mL (ref 0–5.0)
Relative Index: 3.6 (ref 0–4.0)
Total CK: 22 U/L — ABNORMAL LOW (ref 29–143)

## 2018-10-31 LAB — MPO/PR-3 (ANCA) ANTIBODIES
Myeloperoxidase Abs: <1 AI
Serine Protease 3: <1 AI

## 2018-10-31 LAB — CENTROMERE ANTIBODIES: Centromere Ab Screen: <1 AI

## 2018-10-31 LAB — HISTONE ANTIBODIES, IGG, BLOOD

## 2018-10-31 LAB — ANTI-DNA ANTIBODY, DOUBLE-STRANDED: ds DNA Ab: 1 IU/mL

## 2018-10-31 LAB — ANTI-SCLERODERMA ANTIBODY: Scleroderma (Scl-70) (ENA) Antibody, IgG: <1 AI

## 2018-10-31 LAB — ALDOLASE

## 2018-10-31 LAB — ANGIOTENSIN CONVERTING ENZYME: Angiotensin-Converting Enzyme: 75 U/L — ABNORMAL HIGH (ref 9–67)

## 2018-10-31 LAB — ANCA SCREEN W REFLEX TITER: ANCA Screen: POSITIVE — AB

## 2018-10-31 NOTE — Patient Instructions (Signed)
Posture Tips DO: - stand tall and erect - keep chin tucked in - keep head and shoulders in alignment - check posture regularly in mirror or large window - pull head back against headrest in car seat;  Change your position often.  Sit with lumbar support. DON'T: - slouch or slump while watching TV or reading - sit, stand or lie in one position  for too long;  Sitting is especially hard on the spine so if you sit at a desk/use the computer, then stand up often!   Copyright  VHI. All rights reserved.  Posture - Standing   Good posture is important. Avoid slouching and forward head thrust. Maintain curve in low back and align ears over shoul- ders, hips over ankles.  Pull your belly button in toward your back bone.   Copyright  VHI. All rights reserved.  Posture - Sitting   Sit upright, head facing forward. Try using a roll to support lower back. Keep shoulders relaxed, and avoid rounded back. Keep hips level with knees. Avoid crossing legs for long periods.   Copyright  VHI. All rights reserved.  Sleeping on Back  Place pillow under knees. A pillow with cervical support and a roll around waist are also helpful. Copyright  VHI. All rights reserved.  Sleeping on Side Place pillow between knees. Use cervical support under neck and a roll around waist as needed. Copyright  VHI. All rights reserved.   Sleeping on Stomach   If this is the only desirable sleeping position, place pillow under lower legs, and under stomach or chest as needed.  Posture - Sitting   Sit upright, head facing forward. Try using a roll to support lower back. Keep shoulders relaxed, and avoid rounded back. Keep hips level with knees. Avoid crossing legs for long periods. Stand to Sit / Sit to Stand   To sit: Bend knees to lower self onto front edge of chair, then scoot back on seat. To stand: Reverse sequence by placing one foot forward, and scoot to front of seat. Use rocking motion to stand up.   Work  Height and Reach  Ideal work height is no more than 2 to 4 inches below elbow level when standing, and at elbow level when sitting. Reaching should be limited to arm's length, with elbows slightly bent.  Bending  Bend at hips and knees, not back. Keep feet shoulder-width apart.    Posture - Standing   Good posture is important. Avoid slouching and forward head thrust. Maintain curve in low back and align ears over shoul- ders, hips over ankles.  Alternating Positions   Alternate tasks and change positions frequently to reduce fatigue and muscle tension. Take rest breaks. Computer Work   Position work to Programmer, multimedia. Use proper work and seat height. Keep shoulders back and down, wrists straight, and elbows at right angles. Use chair that provides full back support. Add footrest and lumbar roll as needed.  Getting Into / Out of Car  Lower self onto seat, scoot back, then bring in one leg at a time. Reverse sequence to get out.  Dressing  Lie on back to pull socks or slacks over feet, or sit and bend leg while keeping back straight.    Housework - Sink  Place one foot on ledge of cabinet under sink when standing at sink for prolonged periods.   Pushing / Pulling  Pushing is preferable to pulling. Keep back in proper alignment, and use leg muscles to do the  work.  Energy manager and lift with both arms held against upper trunk. Tighten stomach muscles without holding breath. Use smooth movements to avoid jerking.  Avoid Twisting   Avoid twisting or bending back. Pivot around using foot movements, and bend at knees if needed when reaching for articles.  Carrying Luggage   Distribute weight evenly on both sides. Use a cart whenever possible. Do not twist trunk. Move body as a unit.   Lifting Principles .Maintain proper posture and head alignment. .Slide object as close as possible before lifting. .Move obstacles out of the way. .Test before lifting; ask for  help if too heavy. .Tighten stomach muscles without holding breath. .Use smooth movements; do not jerk. .Use legs to do the work, and pivot with feet. .Distribute the work load symmetrically and close to the center of trunk. .Push instead of pull whenever possible.   Ask For Help   Ask for help and delegate to others when possible. Coordinate your movements when lifting together, and maintain the low back curve.  Log Roll   Lying on back, bend left knee and place left arm across chest. Roll all in one movement to the right. Reverse to roll to the left. Always move as one unit. Housework - Sweeping  Use long-handled equipment to avoid stooping.   Housework - Wiping  Position yourself as close as possible to reach work surface. Avoid straining your back.  Laundry - Unloading Wash   To unload small items at bottom of washer, lift leg opposite to arm being used to reach.  [place] close to area to be raked. Use arm movements to do the work. Keep back straight and avoid twisting.     Cart  When reaching into cart with one arm, lift opposite leg to keep back straight.   Getting Into / Out of Bed  Lower self to lie down on one side by raising legs and lowering head at the same time. Use arms to assist moving without twisting. Bend both knees to roll onto back if desired. To sit up, start from lying on side, and use same move-ments in reverse. Housework - Vacuuming  Hold the vacuum with arm held at side. Step back and forth to move it, keeping head up. Avoid twisting.   Laundry - IT consultant so that bending and twisting can be avoided.   Laundry - Unloading Dryer  Squat down to reach into clothes dryer or use a reacher.  Gardening - Weeding / Probation officer or Kneel. Knee pads may be helpful.                   Voncille Lo, PT Certified Exercise Expert for the Aging Adult  10/31/18 4:41 PM Phone:  [phone] Fax: [phone]

## 2018-10-31 NOTE — Therapy (Signed)
[place], [place], [address] Phone: [phone]   Fax:  [phone]  Physical Therapy Evaluation  Patient Details  Name: Cynthia Herrera MRN: [id number] Date of Birth: 30-Oct-1945 Referring Provider (PT): Kristeen Miss MD   Encounter Date: 10/31/2018  PT End of Session - 10/31/18 1638    Visit Number  1    Number of Visits  13    Date for PT Re-Evaluation  12/12/18    Authorization Type  [employer] Medicare.  Progress note on 10th visit and KX modifier on 15 th    PT Start Time  1555    PT Stop Time  1643    PT Time Calculation (min)  48 min    Activity Tolerance  Patient tolerated treatment well    Behavior During Therapy  WFL for tasks assessed/performed       Past Medical History:  Diagnosis Date  . Arthritis   . Breast cancer, left breast ([facility]) 2002   "then treated w/chemo and radiation"  . Breast cancer, left breast ([facility]) 07/14/2017   "tx'd w/mastectomy"  . Chronic back pain    h/o lumbar stenosis; "no problem since my OR" (`10/27/2017)  . Chronic systolic CHF (congestive heart failure) ([facility])   . Complication of anesthesia    last surgery iv med when going to sleep"burned as injected"  . COPD (chronic obstructive pulmonary disease) ([facility])    no inhalers   . DCM (dilated cardiomyopathy) ([facility])    EF 15-20% ? tachycardia induced - EF 35-40% by echo 05/2018  . Dyspnea    with exertion  . Gallstones   . GERD (gastroesophageal reflux disease)    once in a while;depends on what she eats  . History of bronchitis    "chronic when I smoked; no problem since I quit in 2002" (10/27/2017)  . Hyperlipidemia   . Hypertension    takes Losartan and Metoprolol.   . Joint pain   . Muscle spasm    takes Flexeril daily as needed   . Nocturia   . OSA (obstructive sleep apnea) 08/22/2018   Severe obstructive sleep apnea with an AHI 47.3/h and no significant central sleep apnea with moderate oxygen desaturations as  low as 79%  . Osteopenia   . Persistent atrial fibrillation    s/p TEE DCCV and repeat DCCV 11/14/2013 and 02/2018  . Personal history of colonic polyps    adenomas 03 and 08  . Pneumonia    hx  . Seasonal allergies   . Thyroid nodule   . Type II diabetes mellitus ([facility])   . Vitamin D deficiency    takes Vit D    Past Surgical History:  Procedure Laterality Date  . AXILLARY LYMPH NODE DISSECTION Left 10/2001   persistent intramammary node Archie Endo 03/09/2011  . BACK SURGERY    . BREAST BIOPSY Left 2002  . BREAST BIOPSY Right 06/2017   "node bx was negative"  . BREAST BIOPSY Left 06/2017   "positive for cancer"  . BREAST LUMPECTOMY WITH RADIOACTIVE SEED LOCALIZATION Right 08/15/2017   Procedure: RIGHT BREAST LUMPECTOMY WITH RADIOACTIVE SEED LOCALIZATION;  Surgeon: Jovita Kussmaul, MD;  Location: [facility];  Service: General;  Laterality: Right;  . CARDIAC CATHETERIZATION  2015  . CARDIOVERSION N/A 11/12/2013   Procedure: CARDIOVERSION;  Surgeon: Thayer Headings, MD;  Location: [facility] ENDOSCOPY;  Service: Cardiovascular;  Laterality: N/A;  10:08  Dr. Marissa Nestle, anesthesia present, [place]   60mg ,  propofol 50mg ,  IV for elective cardioversion....Dr. Cathie Olden delievered synch 120 joules with successful cardioversion to NSR  . CARDIOVERSION N/A 11/12/2013   Procedure: CARDIOVERSION;  Surgeon: Thayer Headings, MD;  Location: [facility];  Service: Cardiovascular;  Laterality: N/A;  . CARDIOVERSION N/A 11/14/2013   Procedure: CARDIOVERSION (BEDSIDE);  Surgeon: Larey Dresser, MD;  Location: [facility];  Service: Cardiovascular;  Laterality: N/A;  . CARDIOVERSION N/A 02/22/2018   Procedure: CARDIOVERSION;  Surgeon: Sueanne Margarita, MD;  Location: [facility] ENDOSCOPY;  Service: Cardiovascular;  Laterality: N/A;  . CATARACT EXTRACTION W/ INTRAOCULAR LENS  IMPLANT, BILATERAL Bilateral ~ 2016  . COLONOSCOPY  2003, September 2008, April 01, 2011   adenomas 03 and 08, polyp 12, diverticulosis  . COLONOSCOPY WITH PROPOFOL N/A  11/16/2016   Procedure: COLONOSCOPY WITH PROPOFOL;  Surgeon: Gatha Mayer, MD;  Location: [facility] ENDOSCOPY;  Service: Endoscopy;  Laterality: N/A;  . JOINT REPLACEMENT    . LAPAROSCOPIC CHOLECYSTECTOMY  2004  . LEFT AND RIGHT HEART CATHETERIZATION WITH CORONARY ANGIOGRAM N/A 02/01/2014   Procedure: LEFT AND RIGHT HEART CATHETERIZATION WITH CORONARY ANGIOGRAM;  Surgeon: Sueanne Margarita, MD;  Location: [facility] CATH LAB;  Service: Cardiovascular;  Laterality: N/A;  . LUMBAR LAMINECTOMY/DECOMPRESSION MICRODISCECTOMY N/A 03/19/2014   Procedure: LUMBAR LAMINECTOMY/DECOMPRESSION MICRODISCECTOMY 4 LEVEL;  Surgeon: Kristeen Miss, MD;  Location: [facility] NEURO ORS;  Service: Neurosurgery;  Laterality: N/A;  L1-2 L2-3 L3-4 L4-5 Laminectomy/Foraminotomy  . MASS EXCISION Left 08/15/2017   Procedure: EXCISION OF LEFT BREAST MASS;  Surgeon: Jovita Kussmaul, MD;  Location: [facility];  Service: General;  Laterality: Left;  Marland Kitchen MASTECTOMY Left 10/27/2017  . MASTECTOMY PARTIAL / LUMPECTOMY W/ AXILLARY LYMPHADENECTOMY  05/08/2001   Archie Endo 03/09/2011  . PORT-A-CATH REMOVAL  2003  . PORTA CATH INSERTION  2002  . TEE WITHOUT CARDIOVERSION N/A 11/12/2013   Procedure: TRANSESOPHAGEAL ECHOCARDIOGRAM (TEE);  Surgeon: Thayer Headings, MD;  Location: [facility];  Service: Cardiovascular;  Laterality: N/A;  pt b/p low, pt buccal membranes very dry, lips scapped, pt c/o thirst. NPO since MN and iv FLUIDS TOTAL INFUSING AT TOTAL 20ML HR....  Dr. Cathie Olden order allow NS to bolus during procedure....very dry,NS bolus 250 ml total..pt responding well to meds..  . TOTAL KNEE ARTHROPLASTY Left 2006  . TOTAL KNEE ARTHROPLASTY Right 05/10/2016   Procedure: RIGHT TOTAL KNEE ARTHROPLASTY;  Surgeon: Gaynelle Arabian, MD;  Location: [facility] ORS;  Service: Orthopedics;  Laterality: Right;  With adductor block  . TOTAL MASTECTOMY Left 10/27/2017   Procedure: LEFT MASTECTOMY;  Surgeon: Jovita Kussmaul, MD;  Location: [facility];  Service: General;  Laterality: Left;  . TUBAL  LIGATION      There were no vitals filed for this visit.   Subjective Assessment - 10/31/18 1600    Subjective  When I walk on a hard surface.  My back starts hurting. I have a new puppy, a Corgi, standing too long, difficult to do my household chores.  cant sweep my hard wood floors. I also started a [employer] class for people with A-fib    Pertinent History  Bil TKA left 2006, right 2016, Breast CA x 2  Masectomy 10/2017,  A fib,   2014 4 level fusion. with [facility].  See extensive medical history, Osteopenia    Limitations  Standing;House hold activities    How long can you sit comfortably?  unlimited    How long can you stand comfortably?  20 minutes    How long can you walk comfortably?  20 minutes  Diagnostic tests  xray, MRI  of low backCt scan of hip    Patient Stated Goals  I would like to build up my core and stand for at least an hour to get things done    Currently in Pain?  Yes    Pain Score  8    sitting 0/10   Pain Location  Back    Pain Orientation  Left;Right;Mid    Pain Descriptors / Indicators  Aching    Pain Type  Chronic pain    Pain Radiating Towards  sometimes runs down to my left knee at times but not presently    Pain Onset  More than a month ago    Pain Frequency  Intermittent    Aggravating Factors   standing more than 20 minutes, household sweeping, cannot do steps without pain    Pain Relieving Factors  sitting down         [facility] PT Assessment - 10/31/18 1626      Assessment   Medical Diagnosis  Lumbar Spondylosis    Referring Provider (PT)  Kristeen Miss MD    Onset Date/Surgical Date  --   back pain for 2013   Hand Dominance  Right    Next MD Visit  will return to [place] if needed    Prior Therapy  None , pre hab before fusion surgery      Precautions   Precautions  None      Restrictions   Weight Bearing Restrictions  No      Balance Screen   Has the patient fallen in the past 6 months  No    Has the patient had a decrease in activity level  because of a fear of falling?   No    Is the patient reluctant to leave their home because of a fear of falling?   No      Home Environment   Living Environment  Private residence    Living Arrangements  Alone    Type of [place] to enter    Entrance Stairs-Number of Steps  1    [place]  One level      Prior Function   Level of Independence  Independent      Cognition   Overall Cognitive Status  Within Functional Limits for tasks assessed      Observation/Other Assessments   Focus on Therapeutic Outcomes (FOTO)   Foto Intake 46% limitation 54% predicted 45%      Sensation   Light Touch  Appears Intact    Additional Comments  some numbness in bil knees right TKR , but left due to back problems before surgery      Sit to Stand   Comments  16.31 sec normal 13 or less      Posture/Postural Control   Posture/Postural Control  Postural limitations    Postural Limitations  Anterior pelvic tilt;Rounded Shoulders;Forward head;Flexed trunk;Weight shift left    Posture Comments  right pelvic level elevated      ROM / Strength   AROM / PROM / Strength  AROM;Strength      AROM   Overall AROM   Deficits    Lumbar Flexion  80    Lumbar Extension  10    Lumbar - Right Side Bend  13    Lumbar - Left Side Bend  25    Lumbar - Right Rotation  25 % available  Lumbar - Left Rotation  25 % available range      Strength   Overall Strength  Deficits    Right Hip Flexion  4-/5    Right Hip Extension  3-/5    Right Hip ABduction  2/5    Left Hip Extension  3-/5    Left Hip ABduction  4-/5    Right Knee Flexion  4+/5    Right Knee Extension  4+/5    Left Knee Flexion  4+/5    Left Knee Extension  4+/5    Lumbar Flexion  3-/5    Lumbar Extension  3-/5      Flexibility   Hamstrings  right 60 left 55      Palpation   Palpation comment  tenderness over bil lumbar paraspinals and gluteals especially over right gluteal med.      Special Tests    Special  Tests  Lumbar    Lumbar Tests  Slump Test;Straight Leg Raise      Slump test   Findings  Negative    Comment  bil      Straight Leg Raise   Findings  Negative    Comment  bil      Transfers   Comments  Pt with difficulty coming from prone to quadriped and needed mod to max assist.       Ambulation/Gait   Gait Pattern  Lateral hip instability;Decreased hip/knee flexion - left;Decreased arm swing - left;Decreased arm swing - right;Step-through pattern    Ambulation Surface  Level    Gait velocity  2.44ft/sec    Gait Comments  wide base  and trunk lean to left with ambulation.                Objective measurements completed on examination: See above findings.      [place] Adult PT Treatment/Exercise - 10/31/18 1626      Self-Care   Self-Care  Posture;ADL's;Lifting    ADL's  explained use of broom with proper body mechanics    Lifting  verbally explained    Posture  Initial sitting and standing              PT Education - 10/31/18 1707    Education Details  POC Explanation of findings initial posture/body mechanics , gave handout for ADL/body mechanics    Person(s) Educated  Patient    Methods  Explanation;Demonstration    Comprehension  Verbalized understanding;Returned demonstration       PT Short Term Goals - 10/31/18 1607      PT SHORT TERM GOAL #1   Title  Pt will be Independent with initial HEP    Time  3    Period  Weeks    Status  New    Target Date  11/21/18      PT SHORT TERM GOAL #2   Title  "Report pain decrease from 8/10 to  4/10.    Time  3    Period  Weeks    Status  New    Target Date  11/21/18      PT SHORT TERM GOAL #3   Title  "Demonstrate understanding of proper sitting posture and be more conscious of position and posture throughout the day.     Time  3    Period  Weeks    Status  New    Target Date  11/21/18        PT Long Term Goals - 10/31/18 1643  PT LONG TERM GOAL #1   Title  "Pt will be independent with  advanced HEP.     Time  6    Period  Weeks    Status  New    Target Date  12/12/18      PT LONG TERM GOAL #2   Title  Pt will be made aware of community resources after PT RX for continued and safe exercise    Time  6    Period  Weeks    Status  New    Target Date  12/12/18      PT LONG TERM GOAL #3   Title  Pt will demonstrate rising from the floor in a safe and injury free technique    Baseline  Pt unable to get to quadriped independently    Time  6    Period  Weeks    Status  New    Target Date  12/12/18      PT LONG TERM GOAL #4   Title  Pt will be able perform household chores such as sweeping with 3/10 pain or less for 30 minutes or more    Baseline  unable to stand for 20 minutes    Time  6    Period  Weeks    Status  New    Target Date  12/12/18      PT LONG TERM GOAL #5   Title  "FOTO will improve from 54%limitation   to 45% limitation     indicating improved functional mobility.     Time  6    Period  Weeks    Status  New    Target Date  12/12/18      PT LONG TERM GOAL #6   Title  "Pt will improve bil hip  extensor/flexor/abduction  strength to >/= 4/5 with </= 2/10 pain to promote safety with walking/standing activities    Time  6    Period  Weeks    Status  New    Target Date  12/12/18             Plan - 10/31/18 1633    Clinical Impression Statement  73 yo with extensive history for orthopedic problems, with multi level (4) low back fusion and bil TKR. and a recent high grade partial tear of medius  gluteal tear on right side. and she was attending a class at the [facility] [place] where she injured her hip.  Pt is unable to sweep or perform household chores or stand longer than 20 minutes due to her back pain which can rise as high as 8/10.  Pt has muscle weakness, pain and stiffness in spine/hips which contribute to her difficulty with walking and performing household chores.  Pt will benefit from skilled PT to address impairments     Clinical  Presentation  Evolving    Clinical Decision Making  Moderate    Rehab Potential  Good    PT Frequency  2x / week    PT Duration  6 weeks    PT Treatment/Interventions  ADLs/Self Care Home Management;Cryotherapy;Electrical Stimulation;Iontophoresis 4mg /ml Dexamethasone;Moist Heat;Ultrasound;Gait training;Stair training;Functional mobility training;Neuromuscular re-education;Therapeutic exercise;Therapeutic activities;Manual techniques;Patient/family education;Passive range of motion;Dry needling;Taping;Joint Manipulations    PT Next Visit Plan  Go over ADL/handout, initial HEP    Consulted and Agree with Plan of Care  Patient       Patient will benefit from skilled therapeutic intervention in order to improve the following deficits and impairments:  Pain, Obesity, Postural dysfunction, Improper body mechanics, Decreased activity tolerance, Decreased mobility, Decreased range of motion, Difficulty walking, Decreased strength, Hypomobility, Impaired flexibility, Increased muscle spasms, Increased fascial restricitons  Visit Diagnosis: Chronic midline low back pain with sciatica, sciatica laterality unspecified  Muscle weakness (generalized)  Abnormal posture  Difficulty in walking, not elsewhere classified     Problem List Patient Active Problem List   Diagnosis Date Noted  . OSA (obstructive sleep apnea) 08/22/2018  . Trochanteric bursitis, right hip 07/14/2018  . Visit for monitoring Tikosyn therapy 03/21/2018  . Candidiasis of skin 01/05/2018  . Menopausal syndrome 01/05/2018  . Postmenopausal bleeding 01/05/2018  . Postoperative breast asymmetry 12/02/2017  . Recurrent breast cancer, left ([place]) 10/27/2017  . 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 History of left breast cancer 09/19/2017  . Malignant neoplasm of central portion of left breast in female, estrogen receptor positive ([place]) 09/01/2017  . Diabetes  mellitus with peripheral vascular disease ([place]) 05/03/2016  . Diabetes mellitus with coincident hypertension ([place]) 05/03/2016  . 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 Excessive daytime sleepiness 03/12/2015  . Lumbar stenosis 03/19/2014  . Persistent atrial fibrillation   . DCM (dilated cardiomyopathy) ([place])   . Chronic combined systolic and diastolic CHF (congestive heart failure) ([place])   . Chronic anticoagulation- Xarelto 11/16/2013  . Family history of coronary artery disease 11/08/2013  . Coronary artery calcification seen on CAT scan 11/08/2013  . Spinal stenosis of lumbar region 12/18/2012  . BACK PAIN 01/08/2010  . BACK PAIN WITH RADICULOPATHY 02/28/2008  . OSTEOPENIA 05/17/2007  . Dyslipidemia 05/03/2007  . Essential hypertension 05/03/2007  . COPD 05/03/2007  . BREAST CANCER, HX OF 05/03/2007  . History of colonic polyps 05/03/2007    Voncille Lo, PT Certified Exercise Expert for the Aging Adult  10/31/18 5:28 PM Phone: [phone] Fax: [facility] [address] [place], [place], [address] Phone: [phone]   Fax:  [phone]  Name: Cynthia Herrera MRN: [id number] Date of Birth: 1946-08-01

## 2018-11-02 ENCOUNTER — Ambulatory Visit: Payer: Medicare Other | Admitting: Cardiology

## 2018-11-02 ENCOUNTER — Encounter: Payer: Self-pay | Admitting: Internal Medicine

## 2018-11-02 ENCOUNTER — Telehealth: Payer: Self-pay | Admitting: Internal Medicine

## 2018-11-02 ENCOUNTER — Ambulatory Visit (INDEPENDENT_AMBULATORY_CARE_PROVIDER_SITE_OTHER): Payer: Medicare Other | Admitting: Internal Medicine

## 2018-11-02 VITALS — BP 138/62 | HR 58

## 2018-11-02 DIAGNOSIS — Z923 Personal history of irradiation: Secondary | ICD-10-CM

## 2018-11-02 DIAGNOSIS — Z9229 Personal history of other drug therapy: Secondary | ICD-10-CM

## 2018-11-02 DIAGNOSIS — R0602 Shortness of breath: Secondary | ICD-10-CM | POA: Diagnosis not present

## 2018-11-02 DIAGNOSIS — Z87891 Personal history of nicotine dependence: Secondary | ICD-10-CM

## 2018-11-02 DIAGNOSIS — R942 Abnormal results of pulmonary function studies: Secondary | ICD-10-CM | POA: Diagnosis not present

## 2018-11-02 NOTE — Patient Instructions (Addendum)
ICD-10-CM   1. Shortness of breath R06.02   2. Abnormal pulmonary function test R94.2   3. History of amiodarone therapy Z92.29   4. Stopped smoking with greater than 40 pack year history Z87.891   5. Personal history of radiation therapy Z92.3     Based on #2, #3, #4, and #5 - you are a candidate for pulmonary fibrosis aka interstitial lung diseae  High-resolution CT scan of the chest October 03, 2018 is suggestive that you might have interstitial lung disease.  Alternatively, these findings could be due to congestive heart failure although clinically I doubt it because EF has improved, you look "dry" on exam and shortness of breath worsening is only recently58  Plan  -Consideration of interstitial lung disease or pulmonary fibrosis involves stopping amiodarone, committing yourself to prednisone with or without some form of diagnostic lung procedure that would involve a lung biopsy  -Obviously this pathway of management  has implications for heart disease, and cardiac risk  -Therefore, it might be better to the heart failure service rules out any pulmonary congestion into your lungs because of the heart (they might have to consider what is called a right heart catheterization].  I will write to Dr. Linna Hoff about this  -Meanwhile we will continue to monitor the situation closely  -For now continue amiodarone  Follow-up -2-3 months do spirometry and DLCO -Return to see Dr. Chase Caller in [facility] clinic in 2-3 months

## 2018-11-02 NOTE — Telephone Encounter (Signed)
Cynthia Herrera 8 is chronic systolic heart failure.  She has been on amiodarone.  She has abnormal pulmonary function test with reduced diffusion.  Radiology suspects early ILD.  She looks euvolemic but if indeed she has ILD then she will have to stop amiodarone .  She also tells me that she did not tolerate the alternative to amiodarone not otherwise specified.  She also tells me that she can only tolerate low-dose of prednisone.  She is also reluctant to have invasive lung procedures to look at etiology of interstitial lung disease.  Therefore the interstitial lung disease pathway is fraught with certain level of morbidity and reluctance on part of the patient and cardiac risk issues.  Therefore, wondering if she should be subject to a right heart catheterization to make sure she indeed is euvolemic  Thank you  You are seeing her in March 2020   SIGNATURE    Dr. Brand Males, M.D., F.C.C.P,  Pulmonary and Critical Care Medicine Staff Physician, [place] Director - Interstitial Lung Disease  Program  Pulmonary [place] at [place], [place], [address]  Pager: [phone], If no answer or between  15:00h - 7:00h: call [phone] Telephone: [phone]  12:54 PM 11/02/2018

## 2018-11-02 NOTE — Progress Notes (Signed)
Patient ID: Cynthia Herrera, female    DOB: [birth year], 73 y.o.   MRN: [id number]  PCP Patient, No Pcp Per   HPI IOV 09/20/2018  Chief Complaint  Patient presents with  . Consult    referred by [name] MD for abnormal PFTs. pt notes some dyspnea with exertion that is at baseline.   73 year old obese lady with chronic systolic heart failure EF < 35% (aug 2019(, < 25 in may 2019, march 2015) and atrial fibrillation on chronic amiodarone therapy 200 mg a day for the last several years according to history.  She tells me that she has insidious onset of shortness of breath for the last few years.  It has been so gradual in onset she does not remember when but she believes it is a few years.  Definitely not several years.  She feels its insidious worsening of shortness of breath.  Initially she denied any associated cough but then later she said for the last several months has had some cough occasional with some yellow sputum.  But definitely her shortness of breath is progressive very mildly and present on exertion relieved by rest no associated chest pain.  No orthopnea proximal nocturnal dyspnea or worsening edema or change in weight.  No fever or chills.  She had pulmonary function test that showed isolated reduction in diffusion capacity.  I personally visualized the scan.  Rest of lab work-up as mentioned below.  Of note, in the last 3 weeks she has had acute worsening of chronic back pain but she believes this is unrelated to her dyspnea.   Exam nitric oxide today in the office: 09/20/2018 - 21 ppb and normal  She has had 2 CT scans of the chest 1 in 2014 December that I personally visualized and shows diffuse groundglass opacities.  She has another CT scan in #2018 there is more recent that I personally visualized and this shows essentially clear lung fields except scattered lung nodules.  She had a cardiac stress test June 2019 that is reported as low risk  Echocardiogram August  2019 with chronic systolic heart failure with EF 35% and grade 1 diastolic dysfunction  Blood work October 2019 with a creatinine 1.42 mg percent and a low GFR of 36 and a hemoglobin of 12.5 g% that is all stable in March 2019   OV 11/02/2018  Subjective:  Patient ID: Cynthia Herrera, female , DOB: 1946/06/18 , age 75 y.o. , MRN: [id number] , ADDRESS: [address] [place] [facility] [address]   11/02/2018 -   Chief Complaint  Patient presents with  . Follow-up    Pt states she has been doing good since last visit. States her breathing is about the same. Has an occ cough from allergies.     HPI Cynthia Herrera 73 y.o. -has history of amiodarone therapy, radiation therapy and previous smoking.  She was referred to [place] for abnormal pulmonary function test low DLCO.  We did a high-resolution CT chest and this showed possibility of early ILD [indeterminate pattern for UIP which means less than 40% chance that this is indeed pathological UIP/IPF].  We then subjected autoimmune vasculitis panel early January 2020 and this is returned normal.  She is here to discuss all the test results.   [facility] Integrated Comprehensive ILD Questionnaire  Symptoms: She is very categorical that she has had chronic systolic heart failure for many years and ejection fraction recently has improved but the shortness of breath is  only present for the last several months.  Insidious onset and gradually getting worse in the face of improving ejection fraction.  Currently has level 5 dyspnea for walking up stairs and walking up a hill.  And level for dyspnea for walking on level at her own pace.  At rest she has level 1 dyspnea and for ADLs she has level 2 dyspnea.  She denies any cough or wheezing or any other symptoms   Past Medical History : She has chronic systolic heart failure.  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  Denies any asthma or autoimmune or vasculitis.  Denies sleep apnea.  Denies  any thyroid disease stroke or seizures.  Denies hepatitis.   ROS: Positive for fatigue but no arthralgia.  Over the last several months she is lost 25 pounds.  No nausea vomiting or heartburn or rash or ulcers   FAMILY HISTORY of LUNG DISEASE: No family for pulmonary fibrosis but dad and sister had COPD   EXPOSURE HISTORY: She did smoke from 1968 2000 to 1-1/2 packs a day.  She is lived in the same house.  Does not vape or use marijuana or cocaine.   HOME and HOBBY DETAILS : [place].  Age of the home is 81 years.  Over a year ago with the rains she had to like extensive water standing under the house but there is no mold exposure.  Does not use CPAP.  Does not use neb machine.  Does use a steam iron but does not have a mold.  No fountain inside the house no pet birds.  No use of feather blankets or pillows.  No mold in the [facility] duct.  Does not play any wind instruments.  Does do gardening but she uses potted soil.  The soil is never damp.  No damp exposure with gardening   OCCUPATIONAL HISTORY (122 questions) : Positive for gardening but no mold exposure.  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  In the 1970s and 1980s worked at  [employer] and was exposed to [employer].  She is worked in a dusty environment with furniture for 20 years   [place] (27 items): Got radiation therapy to the breast between 2002 and 2003.  Got cancer chemotherapy at that time.  Has been on amiodarone now for the last few years        Results for NASHIA, Cynthia Herrera (MRN [id number]) as of 09/20/2018 12:08  Ref. Range 08/24/2018 08:49  FVC-Pre Latest Units: L 2.77  FVC-%Pred-Pre Latest Units: % 87  FEV1-Pre Latest Units: L 2.28  FEV1-%Pred-Pre Latest Units: % 95  Pre FEV1/FVC ratio Latest Units: % 83  FEV1FVC-%Pred-Pre Latest Units: % 109   Results for Cynthia, Herrera (MRN [id number]) as of 09/20/2018 12:08  Ref. Range  08/24/2018 08:49  TLC Latest Units: L 4.56  TLC % pred Latest Units: % 85   Results for Cynthia, Herrera (MRN [id number]) as of 09/20/2018 12:08  Ref. Range 08/24/2018 08:49  DLCO unc Latest Units: ml/min/mmHg 15.65  DLCO unc % pred Latest Units: % 58    Simple office walk 250 feet x  3 laps goal with forehead probe 11/02/2018   O2 used Room air  Number laps completed 2 and stopped due to dyspnea, and back pain  Comments about pace nomral  Resting Pulse Ox/HR 99% and 58/min  Final Pulse Ox/HR  98% and 93/min  Desaturated </= 88% no  Desaturated <= 3% points no  Got Tachycardic >/= 90/min yes  Symptoms at end of test dyspne and back pain  Miscellaneous comments Stopped at 2 laps       CT chest high resolution 1210/19 IMPRESSION: 1. Extremely subtle ground-glass attenuation noted throughout the lungs bilaterally. This is nonspecific. The possibility of early interstitial lung disease is not entirely excluded. If there's persistent clinical concern for interstitial lung disease, a repeat high-resolution chest CT would be recommended in 12 months to assess for temporal changes in the appearance of the lung parenchyma. 2. No imaging stigmata of amiodarone induced pulmonary toxicity. 3. Mild air trapping, indicative of mild small airways disease. 4. Aortic atherosclerosis, in addition to left main and 3 vessel coronary artery disease. Assessment for potential risk factor modification, dietary therapy or pharmacologic therapy may be warranted, if clinically indicated. 5. Splenomegaly. 6. New postoperative changes of left modified radical mastectomy with postoperative fluid collection associated with the left pectoralis musculature measuring 2.6 x 10.4 cm, likely a postoperative seroma.  Aortic Atherosclerosis (ICD10-I70.0).   Electronically Signed   By: Vinnie Langton M.D.   On: 10/03/2018 20:23    Results for GARI, TROVATO (MRN [id number]) as of 11/02/2018  12:17  Ref. Range 10/26/2018 14:06 10/26/2018 14:15  ASPERGILLUS FUMIGATUS Latest Ref Range: NEGATIVE  NEGATIVE   [place] Serum Latest Ref Range: NEGATIVE  NEGATIVE   Anti Nuclear Antibody(ANA) Latest Ref Range: NEGATIVE  NEGATIVE   Angiotensin-Converting Enzyme Latest Ref Range: 9 - 67 U/L 75 (H)   Anti JO-1 Latest Ref Range: 0.0 - 0.9 AI <0.2   CENTROMERE AB SCREEN Latest Ref Range: <1.0 NEG AI <2.7 NEG   Cyclic Citrullin Peptide Ab Latest Units: UNITS <16   ds DNA Ab Latest Units: IU/mL 1   ENA RNP Ab Latest Ref Range: 0.0 - 0.9 AI <0.2   Myeloperoxidase Abs Latest Units: AI <1.0   Serine Protease 3 Latest Units: AI <1.0   RA Latex Turbid. Latest Ref Range: <14 IU/mL <14   ENA SM Ab Ser-aCnc Latest Ref Range: <1.0 NEG AI  <1.0 NEG  SSA (Ro) (ENA) Antibody, IgG Latest Ref Range: <1.0 NEG AI <1.0 NEG   SSB (La) (ENA) Antibody, IgG Latest Ref Range: <1.0 NEG AI <1.0 NEG   Scleroderma (Scl-70) (ENA) Antibody, IgG Latest Ref Range: <1.0 NEG AI <1.0 NEG     ROS - per HPI     has a past medical history of Arthritis, Breast cancer, left breast ([place]) (2002), Breast cancer, left breast ([place]) (07/14/2017), Chronic back pain, Chronic systolic CHF (congestive heart failure) (HCC), Complication of anesthesia, COPD (chronic obstructive pulmonary disease) ([place]), DCM (dilated cardiomyopathy) ([place]), Dyspnea, Gallstones, GERD (gastroesophageal reflux disease), History of bronchitis, Hyperlipidemia, Hypertension, Joint pain, Muscle spasm, Nocturia, OSA (obstructive sleep apnea) (08/22/2018), Osteopenia, Persistent atrial fibrillation, Personal history of colonic polyps, Pneumonia, Seasonal allergies, Thyroid nodule, Type II diabetes mellitus ([place]), and Vitamin D deficiency.   reports that she quit smoking about 17 years ago. Her smoking use included cigarettes. She has a 52.50 pack-year smoking history. She has never used smokeless tobacco.  Past Surgical History:  Procedure Laterality Date  . AXILLARY  LYMPH NODE DISSECTION Left 10/2001   persistent intramammary node Archie Endo 03/09/2011  . BACK SURGERY    . BREAST BIOPSY Left 2002  . BREAST BIOPSY Right 06/2017   "node bx was negative"  . BREAST BIOPSY Left 06/2017   "positive for cancer"  .  BREAST LUMPECTOMY WITH RADIOACTIVE SEED LOCALIZATION Right 08/15/2017   Procedure: RIGHT BREAST LUMPECTOMY WITH RADIOACTIVE SEED LOCALIZATION;  Surgeon: Jovita Kussmaul, MD;  Location: [place];  Service: General;  Laterality: Right;  . CARDIAC CATHETERIZATION  2015  . CARDIOVERSION N/A 11/12/2013   Procedure: CARDIOVERSION;  Surgeon: Thayer Headings, MD;  Location: [facility] ENDOSCOPY;  Service: Cardiovascular;  Laterality: N/A;  10:08  Dr. Marissa Nestle, anesthesia present, [place]   60mg ,  propofol 50mg , IV for elective cardioversion....Dr. Cathie Olden delievered synch 120 joules with successful cardioversion to NSR  . CARDIOVERSION N/A 11/12/2013   Procedure: CARDIOVERSION;  Surgeon: Thayer Headings, MD;  Location: [place];  Service: Cardiovascular;  Laterality: N/A;  . CARDIOVERSION N/A 11/14/2013   Procedure: CARDIOVERSION (BEDSIDE);  Surgeon: Larey Dresser, MD;  Location: [place];  Service: Cardiovascular;  Laterality: N/A;  . CARDIOVERSION N/A 02/22/2018   Procedure: CARDIOVERSION;  Surgeon: Sueanne Margarita, MD;  Location: [facility] ENDOSCOPY;  Service: Cardiovascular;  Laterality: N/A;  . CATARACT EXTRACTION W/ INTRAOCULAR LENS  IMPLANT, BILATERAL Bilateral ~ 2016  . COLONOSCOPY  2003, September 2008, April 01, 2011   adenomas 03 and 08, polyp 12, diverticulosis  . COLONOSCOPY WITH PROPOFOL N/A 11/16/2016   Procedure: COLONOSCOPY WITH PROPOFOL;  Surgeon: Gatha Mayer, MD;  Location: [facility] ENDOSCOPY;  Service: Endoscopy;  Laterality: N/A;  . JOINT REPLACEMENT    . LAPAROSCOPIC CHOLECYSTECTOMY  2004  . LEFT AND RIGHT HEART CATHETERIZATION WITH CORONARY ANGIOGRAM N/A 02/01/2014   Procedure: LEFT AND RIGHT HEART CATHETERIZATION WITH CORONARY ANGIOGRAM;  Surgeon: Sueanne Margarita, MD;   Location: [place] CATH LAB;  Service: Cardiovascular;  Laterality: N/A;  . LUMBAR LAMINECTOMY/DECOMPRESSION MICRODISCECTOMY N/A 03/19/2014   Procedure: LUMBAR LAMINECTOMY/DECOMPRESSION MICRODISCECTOMY 4 LEVEL;  Surgeon: Kristeen Miss, MD;  Location: [place] NEURO ORS;  Service: Neurosurgery;  Laterality: N/A;  L1-2 L2-3 L3-4 L4-5 Laminectomy/Foraminotomy  . MASS EXCISION Left 08/15/2017   Procedure: EXCISION OF LEFT BREAST MASS;  Surgeon: Jovita Kussmaul, MD;  Location: [place];  Service: General;  Laterality: Left;  Marland Kitchen MASTECTOMY Left 10/27/2017  . MASTECTOMY PARTIAL / LUMPECTOMY W/ AXILLARY LYMPHADENECTOMY  05/08/2001   Archie Endo 03/09/2011  . PORT-A-CATH REMOVAL  2003  . PORTA CATH INSERTION  2002  . TEE WITHOUT CARDIOVERSION N/A 11/12/2013   Procedure: TRANSESOPHAGEAL ECHOCARDIOGRAM (TEE);  Surgeon: Thayer Headings, MD;  Location: [place];  Service: Cardiovascular;  Laterality: N/A;  pt b/p low, pt buccal membranes very dry, lips scapped, pt c/o thirst. NPO since MN and iv FLUIDS TOTAL INFUSING AT TOTAL 20ML HR....  Dr. Cathie Olden order allow NS to bolus during procedure....very dry,NS bolus 250 ml total..pt responding well to meds..  . TOTAL KNEE ARTHROPLASTY Left 2006  . TOTAL KNEE ARTHROPLASTY Right 05/10/2016   Procedure: RIGHT TOTAL KNEE ARTHROPLASTY;  Surgeon: Gaynelle Arabian, MD;  Location: [facility] ORS;  Service: Orthopedics;  Laterality: Right;  With adductor block  . TOTAL MASTECTOMY Left 10/27/2017   Procedure: LEFT MASTECTOMY;  Surgeon: Jovita Kussmaul, MD;  Location: [place];  Service: General;  Laterality: Left;  . TUBAL LIGATION      Allergies  Allergen Reactions  . Tikosyn [Dofetilide]     Prolonged QT/QTc  . Zolpidem Other (See Comments)    Hallucinations, up walking around ? Hallucinations, up walking around  . Codeine Other (See Comments)  . Codeine Phosphate Nausea And Vomiting  . Methylprednisolone Other (See Comments)    Felt really weird w the high dose oral steroid, but tolerates low doses  or oral steroid Felt really weird w the high dose oral steroid, but tolerates low doses or oral steroid    Immunization History  Administered Date(s) Administered  . Influenza Split 09/29/2011, 07/20/2012  . Influenza Whole 08/14/2008  . Influenza,inj,Quad PF,6+ Mos 10/23/2013, 08/20/2014, 08/14/2015, 08/27/2016, 08/09/2018  . Pneumococcal Conjugate-13 10/23/2013  . Pneumococcal Polysaccharide-23 06/18/2011  . Tdap 06/18/2011  . Zoster 07/20/2012    Family History  Problem Relation Age of Onset  . Dementia Sister   . Diabetes Sister   . Alzheimer's disease Sister   . Diabetes Brother        x 2  . Heart disease Brother   . Irritable bowel syndrome Brother   . Liver cancer Mother   . Diabetes Mother   . Pancreatic cancer Mother   . Diabetes Father   . Heart disease Father      Current Outpatient Medications:  .  amiodarone (PACERONE) 200 MG tablet, Take 1 tablet (200 mg total) by mouth daily., Disp: 90 tablet, Rfl: 3 .  atorvastatin (LIPITOR) 40 MG tablet, TAKE 1 TABLET AT BEDTIME, Disp: 90 tablet, Rfl: 2 .  Cholecalciferol (VITAMIN D3) 2000 units capsule, Take 2,000 Units by mouth daily., Disp: , Rfl:  .  clotrimazole-betamethasone (LOTRISONE) cream, Apply 1 application topically 2 (two) times daily., Disp: 30 g, Rfl: 0 .  fexofenadine (ALLEGRA) 180 MG tablet, Take 180 mg by mouth daily as needed for allergies or rhinitis., Disp: , Rfl:  .  fluticasone (FLONASE) 50 MCG/ACT nasal spray, Place 1 spray into both nostrils daily as needed for allergies. , Disp: , Rfl:  .  furosemide (LASIX) 20 MG tablet, TAKE 1 TABLET BY MOUTH TWICE A DAY, Disp: 180 tablet, Rfl: 2 .  glucose blood test strip, Use to check CBG AC and QHS., Disp: 200 each, Rfl: 12 .  KLOR-CON M20 20 MEQ tablet, Take 1 tablet (20 mEq total) by mouth daily., Disp: 90 tablet, Rfl: 3 .  letrozole (FEMARA) 2.5 MG tablet, Take 1 tablet (2.5 mg total) by mouth daily., Disp: 90 tablet, Rfl: 3 .  losartan (COZAAR) 25 MG  tablet, TAKE 1 TABLET BY MOUTH TWICE A DAY, Disp: 180 tablet, Rfl: 3 .  metFORMIN (GLUCOPHAGE) 500 MG tablet, TAKE 1 TABLET TWICE A DAY WITH A MEAL, Disp: 180 tablet, Rfl: 1 .  nebivolol (BYSTOLIC) 2.5 MG tablet, Take 1 tablet (2.5 mg total) by mouth daily., Disp: 90 tablet, Rfl: 3 .  spironolactone (ALDACTONE) 25 MG tablet, TAKE 1 TABLET BY MOUTH EVERY DAY, Disp: 90 tablet, Rfl: 2 .  traMADol (ULTRAM) 50 MG tablet, Take 1-2 tablets (50-100 mg total) by mouth every 6 (six) hours as needed., Disp: 20 tablet, Rfl: 0 .  XARELTO 20 MG TABS tablet, TAKE 1 TABLET EVERY DAY WITH SUPPER, Disp: 30 tablet, Rfl: 6 .  amoxicillin (AMOXIL) 500 MG capsule, Take 2,000 mg by mouth See admin instructions. Take 2000 mg by mouth 1 hour prior to dental appointment, Disp: , Rfl: 5      Objective:   Vitals:   11/02/18 1157  BP: 138/62  Pulse: (!) 58  SpO2: 99%    Estimated body mass index is 36.8 kg/m as calculated from the following:   Height as of 09/20/18: 5\' 6"  (1.676 m).   Weight as of 09/20/18: 228 lb (103.4 kg).  @WEIGHTCHANGE @  There were no vitals filed for this visit.   Physical Exam  General Appearance:    Alert, cooperative, no distress, appears stated age -  yes , Deconditioned looking - no , OBESE  - yes, Sitting on Wheelchair -  no  Head:    Normocephalic, without obvious abnormality, atraumatic  Eyes:    PERRL, conjunctiva/corneas clear,  Ears:    Normal TM's and external ear canals, both ears  Nose:   Nares normal, septum midline, mucosa normal, no drainage    or sinus tenderness. OXYGEN ON  - no . Patient is @ ra   Throat:   Lips, mucosa, and tongue normal; teeth and gums normal. Cyanosis on lips - no  Neck:   Supple, symmetrical, trachea midline, no adenopathy;    thyroid:  no enlargement/tenderness/nodules; no carotid   bruit or JVD  Back:     Symmetric, no curvature, ROM normal, no CVA tenderness  Lungs:     Distress - no , Wheeze no, Barrell Chest - no, Purse lip breathing -  no, Crackles - maybe   Chest Wall:    No tenderness or deformity.    Heart:    Regular rate and rhythm, S1 and S2 normal, no rub   or gallop, Murmur - no  Breast Exam:    NOT DONE  Abdomen:     Soft, non-tender, bowel sounds active all four quadrants,    no masses, no organomegaly. Visceral obesity - yes  Genitalia:   NOT DONE  Rectal:   NOT DONE  Extremities:   Extremities - normal, Has Cane - no, Clubbing - no, Edema - no  Pulses:   2+ and symmetric all extremities  Skin:   Stigmata of Connective Tissue Disease - no  Lymph nodes:   Cervical, supraclavicular, and axillary nodes normal  Psychiatric:  Neurologic:   Pleasant - yes, Anxious - no, Flat affect - no  CAm-ICU - neg, Alert and Oriented x 3 - yes, Moves all 4s - yes, Speech - normal, Cognition - intact           Assessment:       ICD-10-CM   1. Shortness of breath R06.02 Pulmonary function test  2. Abnormal pulmonary function test R94.2   3. History of amiodarone therapy Z92.29   4. Stopped smoking with greater than 40 pack year history Z87.891   5. Personal history of radiation therapy Z92.3    She is at risk candidate for interstitial lung disease given her history of progressive shortness of breath in the face of improving ejection fraction, low DLCO and history of devious radiation, previous smoking, history of amiodarone therapy that ongoing.  However, radiologist not fully convinced she has ILD.  The calling for early subtle changes.  This could be ILD but also wonder if this could be systolic heart failure with pulmonary congestion although clinically she is euvolemic.  If she has ILD then she will have to come off amiodarone even though there is no evidence of amiodarone toxicity elsewhere in the body.  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  The other alternative would be to do empiric prednisone which again she says she can only low-dose .  Overall the consideration of ILD opens her up to not satisfactory options pulmonary and cardiac management together.  Therefore it may be best heart failure services ensures that she is indeed euvolemic with consideration of a right heart catheterization  a good physical exam at the clinic visit  For now at least we can certainly monitor the situation.  I will see her again in a few months with spirometry and DLCO  For now okay to continue amiodarone    Plan:     Patient Instructions     ICD-10-CM   1. Shortness of breath R06.02   2. Abnormal pulmonary function test R94.2   3. History of amiodarone therapy Z92.29   4. Stopped smoking with greater than 40 pack year history Z87.891   5. Personal history of radiation therapy Z92.3     Based on #2, #3, #4, and #5 - you are a candidate for pulmonary fibrosis aka interstitial lung diseae  High-resolution CT scan of the chest October 03, 2018 is suggestive that you might have interstitial lung disease.  Alternatively, these findings could be due to congestive heart failure although clinically I doubt it because EF has improved, you look "dry" on exam and shortness of breath worsening is only recently58  Plan  -Consideration of interstitial lung disease or pulmonary fibrosis involves stopping amiodarone, committing yourself to prednisone with or without some form of diagnostic lung procedure that would involve a lung biopsy  -Obviously this pathway of management  has implications for heart disease, and cardiac risk  -Therefore, it might be better to the heart failure service rules out any pulmonary congestion into your lungs because of the heart (they might have to consider what is called a right heart catheterization].  I will write to Dr. Linna Hoff about this  -Meanwhile we will continue to monitor the situation  closely  -For now continue amiodarone  Follow-up -2-3 months do spirometry and DLCO -Return to see Dr. Chase Caller in ILD clinic in 2-3 months   > 50% of this > 25 min visit spent in face to face counseling or coordination of care - by this undersigned MD - Dr Brand Males. This includes one or more of the following documented above: discussion of test results, diagnostic or treatment recommendations, prognosis, risks and benefits of management options, instructions, education, compliance or risk-factor reduction   SIGNATURE    Dr. Brand Males, M.D., F.C.C.P,  Pulmonary and Critical Care Medicine Staff Physician, [place] Director - Interstitial Lung Disease  Program  Pulmonary [place] at [place], [place], [address]  Pager: [phone], If no answer or between  15:00h - 7:00h: call [phone] Telephone: [phone]  12:50 PM 11/02/2018

## 2018-11-06 NOTE — Telephone Encounter (Signed)
She failed medical therapy with Phyllis Ginger previously due to QT prolongation.  Amiodarone is pretty much her only medicine options.  I agree with you that we should probably stop it.  I think ablation is her best option.  She declined ablation when I saw her last.  Sonia Baller,  Please call Ms Turek and offer follow-up with me to discuss ablation as an option.

## 2018-11-06 NOTE — Telephone Encounter (Signed)
thanks

## 2018-11-06 NOTE — Telephone Encounter (Signed)
Cynthia Herrera - Dr. Rayann Heman is following her for her AF and amio. That said, she did not tolerate AF well and low EF because of it. She had RHC in 5/19 and pressures looked good. I think the main decision is whether or not we need to stop her amio and consider another agent. I have cc'd Jeneen Rinks on this as well for his input. - dan

## 2018-11-07 ENCOUNTER — Ambulatory Visit: Payer: Medicare Other | Admitting: Physical Therapy

## 2018-11-07 ENCOUNTER — Encounter: Payer: Self-pay | Admitting: Physical Therapy

## 2018-11-07 DIAGNOSIS — M5440 Lumbago with sciatica, unspecified side: Principal | ICD-10-CM

## 2018-11-07 DIAGNOSIS — M6281 Muscle weakness (generalized): Secondary | ICD-10-CM

## 2018-11-07 DIAGNOSIS — R262 Difficulty in walking, not elsewhere classified: Secondary | ICD-10-CM

## 2018-11-07 DIAGNOSIS — G8929 Other chronic pain: Secondary | ICD-10-CM

## 2018-11-07 DIAGNOSIS — R293 Abnormal posture: Secondary | ICD-10-CM

## 2018-11-07 NOTE — Telephone Encounter (Signed)
Thanks Linna Hoff and Jeneen Rinks. I will reassess patient in march 2020  Best  MR

## 2018-11-07 NOTE — Patient Instructions (Signed)
   Sit to stand is strengthening   PELVIC TILT Strength and stretch  Lie on back, legs bent. Exhale, tilting top of pelvis back, pubic bone up, to flatten lower back. Inhale, rolling pelvis opposite way, top forward, pubic bone down, arch in back. Repeat __10__ times. Do __2__ sessions per day. Copyright  VHI. All rights reserved.    Isometric Hold With Pelvic Floor (Hook-Lying) Strengthen  Lie with hips and knees bent. Slowly inhale, and then exhale. Pull navel toward spine and tighten pelvic floor. Hold for __10_ seconds. Continue to breathe in and out during hold. Rest for _10__ seconds. Repeat __10_ times. Do __2_ times a day.   Knee Fold strengthen  Lie on back, legs bent, arms by sides. Exhale, lifting knee to chest. Inhale, returning. Keep abdominals flat, navel to spine. Repeat __10__ times, alternating legs. Do __2__ sessions per day.  Knee Drop Strengthen  Keep pelvis stable. Without rotating hips, slowly drop knee to side, pause, return to center, bring knee across midline toward opposite hip. Feel obliques engaging. Repeat for ___10_ times each leg.   Bridge Strengthen   Lie back, legs bent. Inhale, pressing hips up. Keeping ribs in, lengthen lower back. Exhale, rolling down along spine from top. Repeat _15 x 2___ times. Do __1-2__ sessions per day.     Copyright  VHI. All rights reserved. Knee to Chest (Flexion)   Pull knee toward chest. Feel stretch in lower back or buttock area. Breathing deeply, Hold _15_20__ seconds. Repeat with other knee. Repeat __3__ times. Do ____ sessions per day.  http://gt2.exer.us/225   Copyright  VHI. All rights reserved.   Lower Trunk Rotation Stretch   Keeping back flat and feet together, rotate knees to left side. Hold _20-30 ___ seconds. Repeat __2__ times per set. Do __1__ sets per session. Do __1-2__ sessions per day.   Voncille Lo, PT Certified Exercise Expert for the Aging Adult  11/07/18 9:22 AM Phone:  [phone] Fax: [phone]

## 2018-11-07 NOTE — Therapy (Signed)
[place], [place], [address] Phone: [phone]   Fax:  [phone]  Physical Therapy Treatment  Patient Details  Name: Cynthia Herrera MRN: [id number] Date of Birth: 02-10-1946 Referring Provider (PT): Kristeen Miss MD   Encounter Date: 11/07/2018  PT End of Session - 11/07/18 0839    Visit Number  2    Number of Visits  13    Date for PT Re-Evaluation  12/12/18    Authorization Type  [employer] Medicare.  Progress note on 10th visit and KX modifier on 15 th    PT Start Time  [phone]    PT Stop Time  0930    PT Time Calculation (min)  48 min    Activity Tolerance  Patient tolerated treatment well    Behavior During Therapy  [facility] for tasks assessed/performed       Past Medical History:  Diagnosis Date  . Arthritis   . Breast cancer, left breast ([place]) 2002   "then treated w/chemo and radiation"  . Breast cancer, left breast ([place]) 07/14/2017   "tx'd w/mastectomy"  . Chronic back pain    h/o lumbar stenosis; "no problem since my OR" (`10/27/2017)  . Chronic systolic CHF (congestive heart failure) ([place])   . Complication of anesthesia    last surgery iv med when going to sleep"burned as injected"  . COPD (chronic obstructive pulmonary disease) ([facility])    no inhalers   . DCM (dilated cardiomyopathy) ([place])    EF 15-20% ? tachycardia induced - EF 35-40% by echo 05/2018  . Dyspnea    with exertion  . Gallstones   . GERD (gastroesophageal reflux disease)    once in a while;depends on what she eats  . History of bronchitis    "chronic when I smoked; no problem since I quit in 2002" (10/27/2017)  . Hyperlipidemia   . Hypertension    takes Losartan and Metoprolol.   . Joint pain   . Muscle spasm    takes Flexeril daily as needed   . Nocturia   . OSA (obstructive sleep apnea) 08/22/2018   Severe obstructive sleep apnea with an AHI 47.3/h and no significant central sleep apnea with moderate oxygen desaturations as  low as 79%  . Osteopenia   . Persistent atrial fibrillation    s/p TEE DCCV and repeat DCCV 11/14/2013 and 02/2018  . Personal history of colonic polyps    adenomas 03 and 08  . Pneumonia    hx  . Seasonal allergies   . Thyroid nodule   . Type II diabetes mellitus ([place])   . Vitamin D deficiency    takes Vit D    Past Surgical History:  Procedure Laterality Date  . AXILLARY LYMPH NODE DISSECTION Left 10/2001   persistent intramammary node Archie Endo 03/09/2011  . BACK SURGERY    . BREAST BIOPSY Left 2002  . BREAST BIOPSY Right 06/2017   "node bx was negative"  . BREAST BIOPSY Left 06/2017   "positive for cancer"  . BREAST LUMPECTOMY WITH RADIOACTIVE SEED LOCALIZATION Right 08/15/2017   Procedure: RIGHT BREAST LUMPECTOMY WITH RADIOACTIVE SEED LOCALIZATION;  Surgeon: Jovita Kussmaul, MD;  Location: [place];  Service: General;  Laterality: Right;  . CARDIAC CATHETERIZATION  2015  . CARDIOVERSION N/A 11/12/2013   Procedure: CARDIOVERSION;  Surgeon: Thayer Headings, MD;  Location[facility] ENDOSCOPY;  Service: Cardiovascular;  Laterality: N/A;  10:08  Dr. Marissa Nestle, anesthesia present, [place]   60mg ,  propofol 50mg ,  IV for elective cardioversion....Dr. Cathie Olden delievered synch 120 joules with successful cardioversion to NSR  . CARDIOVERSION N/A 11/12/2013   Procedure: CARDIOVERSION;  Surgeon: Thayer Headings, MD;  Location: [place];  Service: Cardiovascular;  Laterality: N/A;  . CARDIOVERSION N/A 11/14/2013   Procedure: CARDIOVERSION (BEDSIDE);  Surgeon: Larey Dresser, MD;  Location: [place];  Service: Cardiovascular;  Laterality: N/A;  . CARDIOVERSION N/A 02/22/2018   Procedure: CARDIOVERSION;  Surgeon: Sueanne Margarita, MD;  Location: [facility] ENDOSCOPY;  Service: Cardiovascular;  Laterality: N/A;  . CATARACT EXTRACTION W/ INTRAOCULAR LENS  IMPLANT, BILATERAL Bilateral ~ 2016  . COLONOSCOPY  2003, September 2008, April 01, 2011   adenomas 03 and 08, polyp 12, diverticulosis  . COLONOSCOPY WITH PROPOFOL N/A  11/16/2016   Procedure: COLONOSCOPY WITH PROPOFOL;  Surgeon: Gatha Mayer, MD;  Location: [facility] ENDOSCOPY;  Service: Endoscopy;  Laterality: N/A;  . JOINT REPLACEMENT    . LAPAROSCOPIC CHOLECYSTECTOMY  2004  . LEFT AND RIGHT HEART CATHETERIZATION WITH CORONARY ANGIOGRAM N/A 02/01/2014   Procedure: LEFT AND RIGHT HEART CATHETERIZATION WITH CORONARY ANGIOGRAM;  Surgeon: Sueanne Margarita, MD;  Location: [place] CATH LAB;  Service: Cardiovascular;  Laterality: N/A;  . LUMBAR LAMINECTOMY/DECOMPRESSION MICRODISCECTOMY N/A 03/19/2014   Procedure: LUMBAR LAMINECTOMY/DECOMPRESSION MICRODISCECTOMY 4 LEVEL;  Surgeon: Kristeen Miss, MD;  Location: [place] NEURO ORS;  Service: Neurosurgery;  Laterality: N/A;  L1-2 L2-3 L3-4 L4-5 Laminectomy/Foraminotomy  . MASS EXCISION Left 08/15/2017   Procedure: EXCISION OF LEFT BREAST MASS;  Surgeon: Jovita Kussmaul, MD;  Location: [place];  Service: General;  Laterality: Left;  Marland Kitchen MASTECTOMY Left 10/27/2017  . MASTECTOMY PARTIAL / LUMPECTOMY W/ AXILLARY LYMPHADENECTOMY  05/08/2001   Archie Endo 03/09/2011  . PORT-A-CATH REMOVAL  2003  . PORTA CATH INSERTION  2002  . TEE WITHOUT CARDIOVERSION N/A 11/12/2013   Procedure: TRANSESOPHAGEAL ECHOCARDIOGRAM (TEE);  Surgeon: Thayer Headings, MD;  Location: [place];  Service: Cardiovascular;  Laterality: N/A;  pt b/p low, pt buccal membranes very dry, lips scapped, pt c/o thirst. NPO since MN and iv FLUIDS TOTAL INFUSING AT TOTAL 20ML HR....  Dr. Cathie Olden order allow NS to bolus during procedure....very dry,NS bolus 250 ml total..pt responding well to meds..  . TOTAL KNEE ARTHROPLASTY Left 2006  . TOTAL KNEE ARTHROPLASTY Right 05/10/2016   Procedure: RIGHT TOTAL KNEE ARTHROPLASTY;  Surgeon: Gaynelle Arabian, MD;  Location: [facility] ORS;  Service: Orthopedics;  Laterality: Right;  With adductor block  . TOTAL MASTECTOMY Left 10/27/2017   Procedure: LEFT MASTECTOMY;  Surgeon: Jovita Kussmaul, MD;  Location: [place];  Service: General;  Laterality: Left;  . TUBAL  LIGATION      There were no vitals filed for this visit.  Subjective Assessment - 11/07/18 0852    Subjective  I have no pain in my back and i did not do chores. only unloaded and loaded my dishwasher I did try to sweep    Pertinent History  Bil TKA left 2006, right 2016, Breast CA x 2  Masectomy 10/2017,  A fib,   2014 4 level fusion. with [facility].  See extensive medical history, Osteopenia    Limitations  Standing;House hold activities    Patient Stated Goals  I would like to build up my core and stand for at least an hour to get things done    Currently in Pain?  No/denies    Pain Score  0-No pain    Pain Location  Back    Pain Orientation  Left;Right;Mid  Pain Descriptors / Indicators  Aching    Pain Type  Chronic pain    Pain Onset  More than a month ago                       [facility] Adult PT Treatment/Exercise - 11/07/18 0856      Self-Care   Self-Care  ADL's;Lifting;Posture    ADL's  problem solved how to do     Lifting  explained and pt lifte 10 lb with VC and TC from mat level     Posture  sitting and standing posture , and how to pick up 10 lb with hip hinge      Lumbar Exercises: Stretches   Single Knee to Chest Stretch  3 reps;20 seconds;Right;Left    Lower Trunk Rotation  2 reps;30 seconds    Lower Trunk Rotation Limitations  toright and to left      Lumbar Exercises: Aerobic   Recumbent Bike  5 min level 2     Nustep  attempted Nustep but pt stated she hurt hip on nustep do DC      Lumbar Exercises: Supine   Ab Set  10 reps    AB Set Limitations  10 sec VC and TC    Pelvic Tilt  15 reps;5 seconds    Clam  10 reps    Clam Limitations  right and left   needs reinforcement    Bent Knee Raise  10 reps;3 seconds    Bent Knee Raise Limitations  right and left    Bridge  10 reps    Bridge Limitations  x 2    Other Supine Lumbar Exercises  sit to stand at sink 2 x 15 with VC for hip hinge             PT Education - 11/07/18 0927     Education Details  initial HEP with prepilates. SKTC, trunk rotation and bridge sit to stand. ADL, lifting and posture with demo of 10 lb    Person(s) Educated  Patient    Methods  Explanation;Demonstration;Tactile cues;Verbal cues;Handout    Comprehension  Verbalized understanding;Returned demonstration       PT Short Term Goals - 11/07/18 8315      PT SHORT TERM GOAL #1   Title  Pt will be Independent with initial HEP    Baseline  gave out initial HEP    Time  3    Period  Weeks    Status  On-going      PT SHORT TERM GOAL #2   Title  "Report pain decrease from 8/10 to  4/10.    Baseline  0/10 today 11-07-18    Time  3    Period  Weeks    Status  On-going      PT SHORT TERM GOAL #3   Title  "Demonstrate understanding of proper sitting posture and be more conscious of position and posture throughout the day.     Baseline  went over handout and reinforced hip  hinge    Time  3    Period  Weeks    Status  On-going        PT Long Term Goals - 10/31/18 1643      PT LONG TERM GOAL #1   Title  "Pt will be independent with advanced HEP.     Time  6    Period  Weeks    Status  New  Target Date  12/12/18      PT LONG TERM GOAL #2   Title  Pt will be made aware of community resources after PT RX for continued and safe exercise    Time  6    Period  Weeks    Status  New    Target Date  12/12/18      PT LONG TERM GOAL #3   Title  Pt will demonstrate rising from the floor in a safe and injury free technique    Baseline  Pt unable to get to quadriped independently    Time  6    Period  Weeks    Status  New    Target Date  12/12/18      PT LONG TERM GOAL #4   Title  Pt will be able perform household chores such as sweeping with 3/10 pain or less for 30 minutes or more    Baseline  unable to stand for 20 minutes    Time  6    Period  Weeks    Status  New    Target Date  12/12/18      PT LONG TERM GOAL #5   Title  "FOTO will improve from 54%limitation   to 45%  limitation     indicating improved functional mobility.     Time  6    Period  Weeks    Status  New    Target Date  12/12/18      PT LONG TERM GOAL #6   Title  "Pt will improve bil hip  extensor/flexor/abduction  strength to >/= 4/5 with </= 2/10 pain to promote safety with walking/standing activities    Time  6    Period  Weeks    Status  New    Target Date  12/12/18            Plan - 11/07/18 0936    Clinical Impression Statement  reviewed ADL, posture and body mechanics using 10 lb KB from mat.  Pt reveiwed all of handout.  Pt given initial HEP for basic back/ pre pilates and added bridging and sit to stand.  Pt iwth no pain in back today.  Will continue toward progression of goals.    Rehab Potential  Good    PT Frequency  2x / week    PT Duration  6 weeks    PT Treatment/Interventions  ADLs/Self Care Home Management;Cryotherapy;Electrical Stimulation;Iontophoresis 4mg /ml Dexamethasone;Moist Heat;Ultrasound;Gait training;Stair training;Functional mobility training;Neuromuscular re-education;Therapeutic exercise;Therapeutic activities;Manual techniques;Patient/family education;Passive range of motion;Dry needling;Taping;Joint Manipulations    PT Next Visit Plan  Review HEP and challenge.  go over        Patient will benefit from skilled therapeutic intervention in order to improve the following deficits and impairments:  Pain, Obesity, Postural dysfunction, Improper body mechanics, Decreased activity tolerance, Decreased mobility, Decreased range of motion, Difficulty walking, Decreased strength, Hypomobility, Impaired flexibility, Increased muscle spasms, Increased fascial restricitons  Visit Diagnosis: Chronic midline low back pain with sciatica, sciatica laterality unspecified  Muscle weakness (generalized)  Abnormal posture  Difficulty in walking, not elsewhere classified     Problem List Patient Active Problem List   Diagnosis Date Noted  . OSA (obstructive  sleep apnea) 08/22/2018  . Trochanteric bursitis, right hip 07/14/2018  . Visit for monitoring Tikosyn therapy 03/21/2018  . Candidiasis of skin 01/05/2018  . Menopausal syndrome 01/05/2018  . Postmenopausal bleeding 01/05/2018  . Postoperative breast asymmetry 12/02/2017  . Recurrent breast cancer, left ([place])  10/27/2017  . 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 History of left breast cancer 09/19/2017  . Malignant neoplasm of central portion of left breast in female, estrogen receptor positive ([place]) 09/01/2017  . Diabetes mellitus with peripheral vascular disease ([place]) 05/03/2016  . Diabetes mellitus with coincident hypertension ([place]) 05/03/2016  . Diabetes type 2 with atherosclerosis of arteries of extremities ([place]) 11/10/2015  . Excessive daytime sleepiness 03/12/2015  . Lumbar stenosis 03/19/2014  . Persistent atrial fibrillation   . DCM (dilated cardiomyopathy) ([place])   . Chronic combined systolic and diastolic CHF (congestive heart failure) ([place])   . Chronic anticoagulation- Xarelto 11/16/2013  . Family history of coronary artery disease 11/08/2013  . Coronary artery calcification seen on CAT scan 11/08/2013  . Spinal stenosis of lumbar region 12/18/2012  . BACK PAIN 01/08/2010  . BACK PAIN WITH RADICULOPATHY 02/28/2008  . OSTEOPENIA 05/17/2007  . Dyslipidemia 05/03/2007  . Essential hypertension 05/03/2007  . COPD 05/03/2007  . BREAST CANCER, HX OF 05/03/2007  . History of colonic polyps 05/03/2007   Voncille Lo, PT Certified Exercise Expert for the Aging Adult  11/07/18 9:39 AM Phone: [phone] Fax: [facility] [address] [place], [place], [address] Phone: [phone]   Fax:  [phone]  Name: Cynthia Herrera MRN: [id number] Date of Birth: Feb 20, 1946

## 2018-11-09 NOTE — Telephone Encounter (Signed)
Spoke with Pt.  Advised Pt to stop amiodarone.  Appt made to discuss tx options with Dr. Rayann Heman on December 04, 2018.    Advised to call office if any breakthrough palpitations or afib prior to OV.  Pt indicates understanding.  Willing to discuss ablation.

## 2018-11-12 ENCOUNTER — Other Ambulatory Visit: Payer: Self-pay | Admitting: Hematology and Oncology

## 2018-11-14 ENCOUNTER — Ambulatory Visit: Payer: Medicare Other | Admitting: Physical Therapy

## 2018-11-14 DIAGNOSIS — M6281 Muscle weakness (generalized): Secondary | ICD-10-CM

## 2018-11-14 DIAGNOSIS — G8929 Other chronic pain: Secondary | ICD-10-CM

## 2018-11-14 DIAGNOSIS — R293 Abnormal posture: Secondary | ICD-10-CM

## 2018-11-14 DIAGNOSIS — M5440 Lumbago with sciatica, unspecified side: Principal | ICD-10-CM

## 2018-11-14 DIAGNOSIS — R262 Difficulty in walking, not elsewhere classified: Secondary | ICD-10-CM

## 2018-11-14 NOTE — Therapy (Signed)
[place], [place], [address] Phone: [phone]   Fax:  [phone]  Physical Therapy Treatment  Patient Details  Name: Cynthia Herrera MRN: [id number] Date of Birth: 10-25-1946 Referring Provider (PT): Kristeen Miss MD   Encounter Date: 11/14/2018    Past Medical History:  Diagnosis Date  . Arthritis   . Breast cancer, left breast ([place]) 2002   "then treated w/chemo and radiation"  . Breast cancer, left breast ([place]) 07/14/2017   "tx'd w/mastectomy"  . Chronic back pain    h/o lumbar stenosis; "no problem since my OR" (`10/27/2017)  . Chronic systolic CHF (congestive heart failure) ([place])   . Complication of anesthesia    last surgery iv med when going to sleep"burned as injected"  . COPD (chronic obstructive pulmonary disease) (H[facility])    no inhalers   . DCM (dilated cardiomyopathy) ([place])    EF 15-20% ? tachycardia induced - EF 35-40% by echo 05/2018  . Dyspnea    with exertion  . Gallstones   . GERD (gastroesophageal reflux disease)    once in a while;depends on what she eats  . History of bronchitis    "chronic when I smoked; no problem since I quit in 2002" (10/27/2017)  . Hyperlipidemia   . Hypertension    takes Losartan and Metoprolol.   . Joint pain   . Muscle spasm    takes Flexeril daily as needed   . Nocturia   . OSA (obstructive sleep apnea) 08/22/2018   Severe obstructive sleep apnea with an AHI 47.3/h and no significant central sleep apnea with moderate oxygen desaturations as low as 79%  . Osteopenia   . Persistent atrial fibrillation    s/p TEE DCCV and repeat DCCV 11/14/2013 and 02/2018  . Personal history of colonic polyps    adenomas 03 and 08  . Pneumonia    hx  . Seasonal allergies   . Thyroid nodule   . Type II diabetes mellitus ([place])   . Vitamin D deficiency    takes Vit D    Past Surgical History:  Procedure Laterality Date  . AXILLARY LYMPH NODE DISSECTION Left  10/2001   persistent intramammary node Archie Endo 03/09/2011  . BACK SURGERY    . BREAST BIOPSY Left 2002  . BREAST BIOPSY Right 06/2017   "node bx was negative"  . BREAST BIOPSY Left 06/2017   "positive for cancer"  . BREAST LUMPECTOMY WITH RADIOACTIVE SEED LOCALIZATION Right 08/15/2017   Procedure: RIGHT BREAST LUMPECTOMY WITH RADIOACTIVE SEED LOCALIZATION;  Surgeon: Jovita Kussmaul, MD;  Location: [facility];  Service: General;  Laterality: Right;  . CARDIAC CATHETERIZATION  2015  . CARDIOVERSION N/A 11/12/2013   Procedure: CARDIOVERSION;  Surgeon: Thayer Headings, MD;  [facility] ENDOSCOPY;  Service: Cardiovascular;  Laterality: N/A;  10:08  Dr. Marissa Nestle, anesthesia present, [place]   60mg ,  propofol 50mg , IV for elective cardioversion....Dr. Cathie Olden delievered synch 120 joules with successful cardioversion to NSR  . CARDIOVERSION N/A 11/12/2013   Procedure: CARDIOVERSION;  Surgeon: Thayer Headings, MD;  Location: [facility];  Service: Cardiovascular;  Laterality: N/A;  . CARDIOVERSION N/A 11/14/2013   Procedure: CARDIOVERSION (BEDSIDE);  Surgeon: Larey Dresser, MD;  Location: [facility];  Service: Cardiovascular;  Laterality: N/A;  . CARDIOVERSION N/A 02/22/2018   Procedure: CARDIOVERSION;  Surgeon: Sueanne Margarita, MD;  Location: [facility];  Service: Cardiovascular;  Laterality: N/A;  . CATARACT EXTRACTION W/ INTRAOCULAR LENS  IMPLANT, BILATERAL Bilateral ~  2016  . COLONOSCOPY  2003, September 2008, April 01, 2011   adenomas 03 and 08, polyp 12, diverticulosis  . COLONOSCOPY WITH PROPOFOL N/A 11/16/2016   Procedure: COLONOSCOPY WITH PROPOFOL;  Surgeon: Gatha Mayer, MD;  Location: [facility] ENDOSCOPY;  Service: Endoscopy;  Laterality: N/A;  . JOINT REPLACEMENT    . LAPAROSCOPIC CHOLECYSTECTOMY  2004  . LEFT AND RIGHT HEART CATHETERIZATION WITH CORONARY ANGIOGRAM N/A 02/01/2014   Procedure: LEFT AND RIGHT HEART CATHETERIZATION WITH CORONARY ANGIOGRAM;  Surgeon: Sueanne Margarita, MD;  Location: [facility] CATH LAB;   Service: Cardiovascular;  Laterality: N/A;  . LUMBAR LAMINECTOMY/DECOMPRESSION MICRODISCECTOMY N/A 03/19/2014   Procedure: LUMBAR LAMINECTOMY/DECOMPRESSION MICRODISCECTOMY 4 LEVEL;  Surgeon: Kristeen Miss, MD;  Location: [facility] NEURO ORS;  Service: Neurosurgery;  Laterality: N/A;  L1-2 L2-3 L3-4 L4-5 Laminectomy/Foraminotomy  . MASS EXCISION Left 08/15/2017   Procedure: EXCISION OF LEFT BREAST MASS;  Surgeon: Jovita Kussmaul, MD;  Location: [facility];  Service: General;  Laterality: Left;  Marland Kitchen MASTECTOMY Left 10/27/2017  . MASTECTOMY PARTIAL / LUMPECTOMY W/ AXILLARY LYMPHADENECTOMY  05/08/2001   Archie Endo 03/09/2011  . PORT-A-CATH REMOVAL  2003  . PORTA CATH INSERTION  2002  . TEE WITHOUT CARDIOVERSION N/A 11/12/2013   Procedure: TRANSESOPHAGEAL ECHOCARDIOGRAM (TEE);  Surgeon: Thayer Headings, MD;  Location: [facility];  Service: Cardiovascular;  Laterality: N/A;  pt b/p low, pt buccal membranes very dry, lips scapped, pt c/o thirst. NPO since MN and iv FLUIDS TOTAL INFUSING AT TOTAL 20ML HR....  Dr. Cathie Olden order allow NS to bolus during procedure....very dry,NS bolus 250 ml total..pt responding well to meds..  . TOTAL KNEE ARTHROPLASTY Left 2006  . TOTAL KNEE ARTHROPLASTY Right 05/10/2016   Procedure: RIGHT TOTAL KNEE ARTHROPLASTY;  Surgeon: Gaynelle Arabian, MD;  Location: [facility] ORS;  Service: Orthopedics;  Laterality: Right;  With adductor block  . TOTAL MASTECTOMY Left 10/27/2017   Procedure: LEFT MASTECTOMY;  Surgeon: Jovita Kussmaul, MD;  Location: [facility];  Service: General;  Laterality: Left;  . TUBAL LIGATION      There were no vitals filed for this visit.  Subjective Assessment - 11/14/18 1426    Subjective  I am probably not going to do much because I don't feel well.  I have been seeing my heart DR because of my heart medication is not working.  I am seeing the lung Dr because i am having breathing difficulties because of my heart medication.  I offered patient to go home and she said yes. No PT today.  She  paid the co pay and did not stay.                                 PT Short Term Goals - 11/07/18 6967      PT SHORT TERM GOAL #1   Title  Pt will be Independent with initial HEP    Baseline  gave out initial HEP    Time  3    Period  Weeks    Status  On-going      PT SHORT TERM GOAL #2   Title  "Report pain decrease from 8/10 to  4/10.    Baseline  0/10 today 11-07-18    Time  3    Period  Weeks    Status  On-going      PT SHORT TERM GOAL #3   Title  "Demonstrate understanding of proper sitting posture and be  more conscious of position and posture throughout the day.     Baseline  went over handout and reinforced hip  hinge    Time  3    Period  Weeks    Status  On-going        PT Long Term Goals - 10/31/18 1643      PT LONG TERM GOAL #1   Title  "Pt will be independent with advanced HEP.     Time  6    Period  Weeks    Status  New    Target Date  12/12/18      PT LONG TERM GOAL #2   Title  Pt will be made aware of community resources after PT RX for continued and safe exercise    Time  6    Period  Weeks    Status  New    Target Date  12/12/18      PT LONG TERM GOAL #3   Title  Pt will demonstrate rising from the floor in a safe and injury free technique    Baseline  Pt unable to get to quadriped independently    Time  6    Period  Weeks    Status  New    Target Date  12/12/18      PT LONG TERM GOAL #4   Title  Pt will be able perform household chores such as sweeping with 3/10 pain or less for 30 minutes or more    Baseline  unable to stand for 20 minutes    Time  6    Period  Weeks    Status  New    Target Date  12/12/18      PT LONG TERM GOAL #5   Title  "FOTO will improve from 54%limitation   to 45% limitation     indicating improved functional mobility.     Time  6    Period  Weeks    Status  New    Target Date  12/12/18      PT LONG TERM GOAL #6   Title  "Pt will improve bil hip  extensor/flexor/abduction  strength  to >/= 4/5 with </= 2/10 pain to promote safety with walking/standing activities    Time  6    Period  Weeks    Status  New    Target Date  12/12/18            Plan - 11/14/18 1431    Clinical Impression Statement  No PT.  Patient is not feeling well.  Patient plans to cancel her appointment on Thursday.     PT Next Visit Plan  Review HEP and challenge.  go over  .  See if patient is feeling better.    Consulted and Agree with Plan of Care  Patient       Patient will benefit from skilled therapeutic intervention in order to improve the following deficits and impairments:     Visit Diagnosis: Chronic midline low back pain with sciatica, sciatica laterality unspecified  Muscle weakness (generalized)  Abnormal posture  Difficulty in walking, not elsewhere classified     Problem List Patient Active Problem List   Diagnosis Date Noted  . OSA (obstructive sleep apnea) 08/22/2018  . Trochanteric bursitis, right hip 07/14/2018  . Visit for monitoring Tikosyn therapy 03/21/2018  . Candidiasis of skin 01/05/2018  . Menopausal syndrome 01/05/2018  . Postmenopausal bleeding 01/05/2018  . Postoperative breast asymmetry 12/02/2017  .  Recurrent breast cancer, left ([place]) 10/27/2017  . 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 History of left breast cancer 09/19/2017  . Malignant neoplasm of central portion of left breast in female, estrogen receptor positive ([place]) 09/01/2017  . Diabetes mellitus with peripheral vascular disease ([place]) 05/03/2016  . Diabetes mellitus with coincident hypertension ([place]) 05/03/2016  . 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 Excessive daytime sleepiness 03/12/2015  . Lumbar stenosis 03/19/2014  . Persistent atrial fibrillation   . DCM (dilated cardiomyopathy) ([place])   . Chronic combined systolic and diastolic CHF (congestive heart failure)  ([place])   . Chronic anticoagulation- Xarelto 11/16/2013  . Family history of coronary artery disease 11/08/2013  . Coronary artery calcification seen on CAT scan 11/08/2013  . Spinal stenosis of lumbar region 12/18/2012  . BACK PAIN 01/08/2010  . BACK PAIN WITH RADICULOPATHY 02/28/2008  . OSTEOPENIA 05/17/2007  . Dyslipidemia 05/03/2007  . Essential hypertension 05/03/2007  . COPD 05/03/2007  . BREAST CANCER, HX OF 05/03/2007  . History of colonic polyps 05/03/2007    Macel Yearsley PTA 11/14/2018, 2:32 PM  [facility] [address] [place], [place], [address] Phone: [phone]   Fax:  [phone]  Name: MADELIN WESEMAN MRN: [id number] Date of Birth: [birth year]

## 2018-11-15 ENCOUNTER — Encounter: Payer: Self-pay | Admitting: Family Medicine

## 2018-11-15 ENCOUNTER — Ambulatory Visit (INDEPENDENT_AMBULATORY_CARE_PROVIDER_SITE_OTHER): Payer: Medicare Other | Admitting: Family Medicine

## 2018-11-15 VITALS — BP 110/64 | HR 72 | Temp 97.7°F | Wt 226.0 lb

## 2018-11-15 DIAGNOSIS — J0110 Acute frontal sinusitis, unspecified: Secondary | ICD-10-CM | POA: Diagnosis not present

## 2018-11-15 MED ORDER — AMOXICILLIN-POT CLAVULANATE 875-125 MG PO TABS: 1.0000 | ORAL_TABLET | Freq: Two times a day (BID) | ORAL | 0 refills | Status: AC

## 2018-11-15 NOTE — Patient Instructions (Signed)
Sinusitis, Adult  Sinusitis is inflammation of your sinuses. Sinuses are hollow spaces in the bones around your face. Your sinuses are located:   Around your eyes.   In the middle of your forehead.   Behind your nose.   In your cheekbones.  Mucus normally drains out of your sinuses. When your nasal tissues become inflamed or swollen, mucus can become trapped or blocked. This allows bacteria, viruses, and fungi to grow, which leads to infection. Most infections of the sinuses are caused by a virus.  Sinusitis can develop quickly. It can last for up to 4 weeks (acute) or for more than 12 weeks (chronic). Sinusitis often develops after a cold.  What are the causes?  This condition is caused by anything that creates swelling in the sinuses or stops mucus from draining. This includes:   Allergies.   Asthma.   Infection from bacteria or viruses.   Deformities or blockages in your nose or sinuses.   Abnormal growths in the nose (nasal polyps).   Pollutants, such as chemicals or irritants in the air.   Infection from fungi (rare).  What increases the risk?  You are more likely to develop this condition if you:   Have a weak body defense system (immune system).   Do a lot of swimming or diving.   Overuse nasal sprays.   Smoke.  What are the signs or symptoms?  The main symptoms of this condition are pain and a feeling of pressure around the affected sinuses. Other symptoms include:   Stuffy nose or congestion.   Thick drainage from your nose.   Swelling and warmth over the affected sinuses.   Headache.   Upper toothache.   A cough that may get worse at night.   Extra mucus that collects in the throat or the back of the nose (postnasal drip).   Decreased sense of smell and taste.   Fatigue.   A fever.   Sore throat.   Bad breath.  How is this diagnosed?  This condition is diagnosed based on:   Your symptoms.   Your medical history.   A physical exam.   Tests to find out if your condition is  acute or chronic. This may include:  ? Checking your nose for nasal polyps.  ? Viewing your sinuses using a device that has a light (endoscope).  ? Testing for allergies or bacteria.  ? Imaging tests, such as an MRI or CT scan.  In rare cases, a bone biopsy may be done to rule out more serious types of fungal sinus disease.  How is this treated?  Treatment for sinusitis depends on the cause and whether your condition is chronic or acute.   If caused by a virus, your symptoms should go away on their own within 10 days. You may be given medicines to relieve symptoms. They include:  ? Medicines that shrink swollen nasal passages (topical intranasal decongestants).  ? Medicines that treat allergies (antihistamines).  ? A spray that eases inflammation of the nostrils (topical intranasal corticosteroids).  ? Rinses that help get rid of thick mucus in your nose (nasal saline washes).   If caused by bacteria, your health care provider may recommend waiting to see if your symptoms improve. Most bacterial infections will get better without antibiotic medicine. You may be given antibiotics if you have:  ? A severe infection.  ? A weak immune system.   If caused by narrow nasal passages or nasal polyps, you may need   to have surgery.  Follow these instructions at home:  Medicines   Take, use, or apply over-the-counter and prescription medicines only as told by your health care provider. These may include nasal sprays.   If you were prescribed an antibiotic medicine, take it as told by your health care provider. Do not stop taking the antibiotic even if you start to feel better.  Hydrate and humidify     Drink enough fluid to keep your urine pale yellow. Staying hydrated will help to thin your mucus.   Use a cool mist humidifier to keep the humidity level in your home above 50%.   Inhale steam for 10-15 minutes, 3-4 times a day, or as told by your health care provider. You can do this in the bathroom while a hot shower is  running.   Limit your exposure to cool or dry air.  Rest   Rest as much as possible.   Sleep with your head raised (elevated).   Make sure you get enough sleep each night.  General instructions     Apply a warm, moist washcloth to your face 3-4 times a day or as told by your health care provider. This will help with discomfort.   Wash your hands often with soap and water to reduce your exposure to germs. If soap and water are not available, use hand sanitizer.   Do not smoke. Avoid being around people who are smoking (secondhand smoke).   Keep all follow-up visits as told by your health care provider. This is important.  Contact a health care provider if:   You have a fever.   Your symptoms get worse.   Your symptoms do not improve within 10 days.  Get help right away if:   You have a severe headache.   You have persistent vomiting.   You have severe pain or swelling around your face or eyes.   You have vision problems.   You develop confusion.   Your neck is stiff.   You have trouble breathing.  Summary   Sinusitis is soreness and inflammation of your sinuses. Sinuses are hollow spaces in the bones around your face.   This condition is caused by nasal tissues that become inflamed or swollen. The swelling traps or blocks the flow of mucus. This allows bacteria, viruses, and fungi to grow, which leads to infection.   If you were prescribed an antibiotic medicine, take it as told by your health care provider. Do not stop taking the antibiotic even if you start to feel better.   Keep all follow-up visits as told by your health care provider. This is important.  This information is not intended to replace advice given to you by your health care provider. Make sure you discuss any questions you have with your health care provider.  Document Released: 10/11/2005 Document Revised: 03/13/2018 Document Reviewed: 03/13/2018  Elsevier Interactive Patient Education  2019 [employer] Inc[employer]

## 2018-11-15 NOTE — Progress Notes (Signed)
Subjective:    Patient ID: Cynthia Herrera, female    DOB: 1945-11-08, 73 y.o.   MRN: [id number]  No chief complaint on file.   HPI Patient was seen today for acute concern.  Pt previously seen by Dr. Burnice Logan, but has yet to est care with a new provider.    Pt endorses feeling dizzzy/"swimmy in the head", pain in forehead, coughing up yellow phlegm, and blowing her nose frequently x 3 wks.  Pt denies HAs, ear pain/pressure, sore throat.  Pt has tried Human resources officer and generic flonase for her sympotms.  Pt had to stop using flonase nightly as it caused her nose to be sore.  Pt denies sick contacts.  Past Medical History:  Diagnosis Date  . Arthritis   . Breast cancer, left breast ([place]) 2002   "then treated w/chemo and radiation"  . Breast cancer, left breast ([place]) 07/14/2017   "tx'd w/mastectomy"  . Chronic back pain    h/o lumbar stenosis; "no problem since my OR" (`10/27/2017)  . Chronic systolic CHF (congestive heart failure) ([place])   . Complication of anesthesia    last surgery iv med when going to sleep"burned as injected"  . COPD (chronic obstructive pulmonary disease) ([facility])    no inhalers   . DCM (dilated cardiomyopathy) ([place])    EF 15-20% ? tachycardia induced - EF 35-40% by echo 05/2018  . Dyspnea    with exertion  . Gallstones   . GERD (gastroesophageal reflux disease)    once in a while;depends on what she eats  . History of bronchitis    "chronic when I smoked; no problem since I quit in 2002" (10/27/2017)  . Hyperlipidemia   . Hypertension    takes Losartan and Metoprolol.   . Joint pain   . Muscle spasm    takes Flexeril daily as needed   . Nocturia   . OSA (obstructive sleep apnea) 08/22/2018   Severe obstructive sleep apnea with an AHI 47.3/h and no significant central sleep apnea with moderate oxygen desaturations as low as 79%  . Osteopenia   . Persistent atrial fibrillation    s/p TEE DCCV and repeat DCCV 11/14/2013 and 02/2018  . Personal history of colonic  polyps    adenomas 03 and 08  . Pneumonia    hx  . Seasonal allergies   . Thyroid nodule   . Type II diabetes mellitus ([place])   . Vitamin D deficiency    takes Vit D    Allergies  Allergen Reactions  . Tikosyn [Dofetilide]     Prolonged QT/QTc  . Zolpidem Other (See Comments)    Hallucinations, up walking around ? Hallucinations, up walking around  . Codeine Other (See Comments)  . Codeine Phosphate Nausea And Vomiting  . Methylprednisolone Other (See Comments)    Felt really weird w the high dose oral steroid, but tolerates low doses or oral steroid Felt really weird w the high dose oral steroid, but tolerates low doses or oral steroid    ROS General: Denies fever, chills, night sweats, changes in weight, changes in appetite   +dizziness HEENT: Denies headaches, ear pain, changes in vision, rhinorrhea, sore throat  +pain in forehead, sinus congestion CV: Denies CP, palpitations, SOB, orthopnea Pulm: Denies SOB, wheezing  +cough GI: Denies abdominal pain, nausea, vomiting, diarrhea, constipation GU: Denies dysuria, hematuria, frequency, vaginal discharge Msk: Denies muscle cramps, joint pains Neuro: Denies weakness, numbness, tingling Skin: Denies rashes, bruising Psych: Denies depression, anxiety, hallucinations  Objective:  Blood pressure 110/64, pulse 72, temperature 97.7 F (36.5 C), temperature source Oral, weight 226 lb (102.5 kg), SpO2 98 %.  Gen. Pleasant, well-nourished, in no distress, normal affect   HEENT: [place]/AT, face symmetric, no scleral icterus, PERRLA,  TTP of frontal sinuses, nares patent without drainage, pharynx without erythema or exudate. No cervical lymphadenopathy. Lungs: no accessory muscle use, CTAB, no wheezes or rales Cardiovascular: RRR, no m/r/g, no peripheral edema Neuro:  A&Ox3, CN II-XII intact, normal gait  Wt Readings from Last 3 Encounters:  11/15/18 226 lb (102.5 kg)  09/20/18 228 lb (103.4 kg)  09/19/18 231 lb (104.8 kg)     Lab Results  Component Value Date   WBC 9.1 01/02/2018   HGB 12.5 01/02/2018   HCT 37.8 01/02/2018   PLT 335 01/02/2018   GLUCOSE 100 (H) 08/23/2018   CHOL 121 03/03/2018   TRIG 128 03/03/2018   HDL 39 (L) 03/03/2018   LDLCALC 56 03/03/2018   ALT 28 08/01/2018   AST 30 08/01/2018   NA 139 08/23/2018   K 4.2 08/23/2018   CL 104 08/23/2018   CREATININE 1.42 (H) 08/23/2018   BUN 18 08/23/2018   CO2 26 08/23/2018   TSH 0.488 08/01/2018   INR 1.10 01/02/2018   HGBA1C 5.2 07/04/2018   MICROALBUR 1.2 02/03/2017    Assessment/Plan:  Subacute frontal sinusitis  -Given handout - Plan: amoxicillin-clavulanate (AUGMENTIN) 875-125 MG tablet  F/u prn  Grier Mitts, MD

## 2018-11-16 ENCOUNTER — Encounter: Payer: Medicare Other | Admitting: Physical Therapy

## 2018-11-17 ENCOUNTER — Encounter: Payer: Self-pay | Admitting: Internal Medicine

## 2018-11-17 ENCOUNTER — Ambulatory Visit: Payer: Medicare Other | Admitting: Internal Medicine

## 2018-11-17 VITALS — BP 122/68 | HR 60 | Ht 66.0 in | Wt 228.0 lb

## 2018-11-17 DIAGNOSIS — I4819 Other persistent atrial fibrillation: Secondary | ICD-10-CM | POA: Diagnosis not present

## 2018-11-17 DIAGNOSIS — I480 Paroxysmal atrial fibrillation: Secondary | ICD-10-CM

## 2018-11-17 DIAGNOSIS — G4733 Obstructive sleep apnea (adult) (pediatric): Secondary | ICD-10-CM

## 2018-11-17 MED ORDER — METOPROLOL TARTRATE 50 MG PO TABS: 50.0000 mg | ORAL_TABLET | Freq: Once | ORAL | 3 refills | Status: DC

## 2018-11-17 NOTE — Patient Instructions (Addendum)
Medication Instructions:  Your physician recommends that you continue on your current medications as directed. Please refer to the Current Medication list given to you today. If you need a refill on your cardiac medications before your next appointment, please call your pharmacy.   Labwork: Today bmet, cbc  Testing/Procedures: Your physician has requested that you have cardiac CT. Cardiac computed tomography (CT) is a painless test that uses an x-ray machine to take clear, detailed pictures of your heart. For further information please visit [email or web address]. Please follow instruction sheet as given. You will get a call from our office to schedule the date for this test.  Your physician has recommended that you have an ablation. Catheter ablation is a medical procedure used to treat some cardiac arrhythmias (irregular heartbeats). During catheter ablation, a long, thin, flexible tube is put into a blood vessel in your groin (upper thigh), or neck. This tube is called an ablation catheter. It is then guided to your heart through the blood vessel. Radio frequency waves destroy small areas of heart tissue where abnormal heartbeats may cause an arrhythmia to start. Please see the instruction sheet given to you today.   Follow-Up: You will follow up with Roderic Palau, NP with the Atrial fibrillation (Afib) clinic 4 weeks after your ablation.  You will follow up with Dr. Rayann Heman 3 months after your procedure.  CARDIAC CT INSTRUCTIONS:  Please arrive at the [facility] main entrance of [facility] at xx:xx AM (30-45 minutes prior to test start time)  [facility] [place], [place] [address] [phone]  Proceed to the [facility] Radiology Department (First Floor).  Please follow these instructions carefully (unless otherwise directed):   On the Night Before the Test: . Be sure to Drink plenty of water. . Do not consume any caffeinated/decaffeinated  beverages or chocolate 12 hours prior to your test. . Do not take any antihistamines 12 hours prior to your test.  Metformin do not take 24 hours prior to test.  On the Day of the Test: . Drink plenty of water. Do not drink any water within one hour of the test. . Do not eat any food 4 hours prior to the test. . You may take your regular medications prior to the test.  . Take metoprolol (Lopressor) two hours prior to test. . HOLD Furosemide/Hydrochlorothiazide morning of the test.       After the Test: . Drink plenty of water. . After receiving IV contrast, you may experience a mild flushed feeling. This is normal. . On occasion, you may experience a mild rash up to 24 hours after the test. This is not dangerous. If this occurs, you can take Benadryl 25 mg and increase your fluid intake. . If you experience trouble breathing, this can be serious. If it is severe call 911 IMMEDIATELY. If it is mild, please call our office. . If you take any of these medications: Glipizide/Metformin, Avandament, Glucavance, please do not take 48 hours after completing test.   ABLATION INSTRUCTIONS:  Please arrive at the [facility] main entrance of [facility] at: 5:30 am. Do not eat or drink after midnight prior to procedure On the morning of your procedure do not take any medications. Plan for one night stay.  You will need someone to drive you home at discharge.

## 2018-11-17 NOTE — Progress Notes (Signed)
Electrophysiology Office Note Date: 11/17/2018  ID:  Cynthia Herrera, Cynthia Herrera [birth year], MRN [id number]  PCP: Isaac Bliss, Rayford Halsted, MD Primary Cardiologist: Dr Turner/Dr Haroldine Laws Electrophysiologist: Dr Rayann Heman  CC: Follow up for atrial fibrillation  Cynthia Herrera is a 73 y.o. female seen today for routine electrophysiology followup. Since last being seen in our clinic, her amiodarone was discontinued 2/2 worsening PFTs. She has not noticed any symptoms of atrial fibrillation in the interim. Of note, she has been diagnosed with OSA but has not been able to tolerate CPAP therapy. She continues to have shortness of breath and fatigue.  She denies chest pain, palpitations, PND, orthopnea, nausea, vomiting, syncope, edema, weight gain, or early satiety.  Past Medical History:  Diagnosis Date  . Arthritis   . Breast cancer, left breast ([place]) 2002   "then treated w/chemo and radiation"  . Breast cancer, left breast ([place]) 07/14/2017   "tx'd w/mastectomy"  . Chronic back pain    h/o lumbar stenosis; "no problem since my OR" (`10/27/2017)  . Chronic systolic CHF (congestive heart failure) ([place])   . Complication of anesthesia    last surgery iv med when going to sleep"burned as injected"  . COPD (chronic obstructive pulmonary disease) ([facility])    no inhalers   . DCM (dilated cardiomyopathy) ([place])    EF 15-20% ? tachycardia induced - EF 35-40% by echo 05/2018  . Dyspnea    with exertion  . Gallstones   . GERD (gastroesophageal reflux disease)    once in a while;depends on what she eats  . History of bronchitis    "chronic when I smoked; no problem since I quit in 2002" (10/27/2017)  . Hyperlipidemia   . Hypertension    takes Losartan and Metoprolol.   . Joint pain   . Muscle spasm    takes Flexeril daily as needed   . Nocturia   . OSA (obstructive sleep apnea) 08/22/2018   Severe obstructive sleep apnea with an AHI 47.3/h and no significant central sleep apnea with moderate  oxygen desaturations as low as 79%  . Osteopenia   . Persistent atrial fibrillation    s/p TEE DCCV and repeat DCCV 11/14/2013 and 02/2018  . Personal history of colonic polyps    adenomas 03 and 08  . Pneumonia    hx  . Seasonal allergies   . Thyroid nodule   . Type II diabetes mellitus ([place])   . Vitamin D deficiency    takes Vit D   Past Surgical History:  Procedure Laterality Date  . AXILLARY LYMPH NODE DISSECTION Left 10/2001   persistent intramammary node Archie Endo 03/09/2011  . BACK SURGERY    . BREAST BIOPSY Left 2002  . BREAST BIOPSY Right 06/2017   "node bx was negative"  . BREAST BIOPSY Left 06/2017   "positive for cancer"  . BREAST LUMPECTOMY WITH RADIOACTIVE SEED LOCALIZATION Right 08/15/2017   Procedure: RIGHT BREAST LUMPECTOMY WITH RADIOACTIVE SEED LOCALIZATION;  Surgeon: Jovita Kussmaul, MD;  Location: [place];  Service: General;  Laterality: Right;  . CARDIAC CATHETERIZATION  2015  . CARDIOVERSION N/A 11/12/2013   Procedure: CARDIOVERSION;  Surgeon: Thayer Headings, MD;  Location: [facility] ENDOSCOPY;  Service: Cardiovascular;  Laterality: N/A;  10:08  Dr. Marissa Nestle, anesthesia present, [place]   60mg ,  propofol 50mg , IV for elective cardioversion....Dr. Cathie Olden delievered synch 120 joules with successful cardioversion to NSR  . CARDIOVERSION N/A 11/12/2013   Procedure: CARDIOVERSION;  Surgeon: Thayer Headings, MD;  Location: [facility] ENDOSCOPY;  Service: Cardiovascular;  Laterality: N/A;  . CARDIOVERSION N/A 11/14/2013   Procedure: CARDIOVERSION (BEDSIDE);  Surgeon: Larey Dresser, MD;  Location: [facility];  Service: Cardiovascular;  Laterality: N/A;  . CARDIOVERSION N/A 02/22/2018   Procedure: CARDIOVERSION;  Surgeon: Sueanne Margarita, MD;  Location: [facility] ENDOSCOPY;  Service: Cardiovascular;  Laterality: N/A;  . CATARACT EXTRACTION W/ INTRAOCULAR LENS  IMPLANT, BILATERAL Bilateral ~ 2016  . COLONOSCOPY  2003, September 2008, April 01, 2011   adenomas 03 and 08, polyp 12, diverticulosis  .  COLONOSCOPY WITH PROPOFOL N/A 11/16/2016   Procedure: COLONOSCOPY WITH PROPOFOL;  Surgeon: Gatha Mayer, MD;  Location: [facility] ENDOSCOPY;  Service: Endoscopy;  Laterality: N/A;  . JOINT REPLACEMENT    . LAPAROSCOPIC CHOLECYSTECTOMY  2004  . LEFT AND RIGHT HEART CATHETERIZATION WITH CORONARY ANGIOGRAM N/A 02/01/2014   Procedure: LEFT AND RIGHT HEART CATHETERIZATION WITH CORONARY ANGIOGRAM;  Surgeon: Sueanne Margarita, MD;  Location: [facility] CATH LAB;  Service: Cardiovascular;  Laterality: N/A;  . LUMBAR LAMINECTOMY/DECOMPRESSION MICRODISCECTOMY N/A 03/19/2014   Procedure: LUMBAR LAMINECTOMY/DECOMPRESSION MICRODISCECTOMY 4 LEVEL;  Surgeon: Kristeen Miss, MD;  Location: [facility] NEURO ORS;  Service: Neurosurgery;  Laterality: N/A;  L1-2 L2-3 L3-4 L4-5 Laminectomy/Foraminotomy  . MASS EXCISION Left 08/15/2017   Procedure: EXCISION OF LEFT BREAST MASS;  Surgeon: Jovita Kussmaul, MD;  Location: [facility];  Service: General;  Laterality: Left;  Marland Kitchen MASTECTOMY Left 10/27/2017  . MASTECTOMY PARTIAL / LUMPECTOMY W/ AXILLARY LYMPHADENECTOMY  05/08/2001   Archie Endo 03/09/2011  . PORT-A-CATH REMOVAL  2003  . PORTA CATH INSERTION  2002  . TEE WITHOUT CARDIOVERSION N/A 11/12/2013   Procedure: TRANSESOPHAGEAL ECHOCARDIOGRAM (TEE);  Surgeon: Thayer Headings, MD;  Location: [facility];  Service: Cardiovascular;  Laterality: N/A;  pt b/p low, pt buccal membranes very dry, lips scapped, pt c/o thirst. NPO since MN and iv FLUIDS TOTAL INFUSING AT TOTAL 20ML HR....  Dr. Cathie Olden order allow NS to bolus during procedure....very dry,NS bolus 250 ml total..pt responding well to meds..  . TOTAL KNEE ARTHROPLASTY Left 2006  . TOTAL KNEE ARTHROPLASTY Right 05/10/2016   Procedure: RIGHT TOTAL KNEE ARTHROPLASTY;  Surgeon: Gaynelle Arabian, MD;  Location: [facility] ORS;  Service: Orthopedics;  Laterality: Right;  With adductor block  . TOTAL MASTECTOMY Left 10/27/2017   Procedure: LEFT MASTECTOMY;  Surgeon: Jovita Kussmaul, MD;  Location: [facility];  Service: General;   Laterality: Left;  . TUBAL LIGATION      Current Outpatient Medications  Medication Sig Dispense Refill  . amoxicillin (AMOXIL) 500 MG capsule Take 2,000 mg by mouth See admin instructions. Take 2000 mg by mouth 1 hour prior to dental appointment  5  . amoxicillin-clavulanate (AUGMENTIN) 875-125 MG tablet Take 1 tablet by mouth 2 (two) times daily for 7 days. 14 tablet 0  . atorvastatin (LIPITOR) 40 MG tablet TAKE 1 TABLET AT BEDTIME 90 tablet 2  . Cholecalciferol (VITAMIN D3) 2000 units capsule Take 2,000 Units by mouth daily.    . clotrimazole-betamethasone (LOTRISONE) cream Apply 1 application topically 2 (two) times daily. 30 g 0  . fexofenadine (ALLEGRA) 180 MG tablet Take 180 mg by mouth daily as needed for allergies or rhinitis.    . fluticasone (FLONASE) 50 MCG/ACT nasal spray Place 1 spray into both nostrils daily as needed for allergies.     . furosemide (LASIX) 20 MG tablet TAKE 1 TABLET BY MOUTH TWICE A DAY 180 tablet 2  . glucose blood test strip Use to check  CBG AC and QHS. 200 each 12  . KLOR-CON M20 20 MEQ tablet Take 1 tablet (20 mEq total) by mouth daily. 90 tablet 3  . letrozole (FEMARA) 2.5 MG tablet TAKE 1 TABLET BY MOUTH EVERY DAY 90 tablet 3  . losartan (COZAAR) 25 MG tablet TAKE 1 TABLET BY MOUTH TWICE A DAY 180 tablet 3  . metFORMIN (GLUCOPHAGE) 500 MG tablet TAKE 1 TABLET TWICE A DAY WITH A MEAL 180 tablet 1  . nebivolol (BYSTOLIC) 2.5 MG tablet Take 1 tablet (2.5 mg total) by mouth daily. 90 tablet 3  . spironolactone (ALDACTONE) 25 MG tablet TAKE 1 TABLET BY MOUTH EVERY DAY 90 tablet 2  . traMADol (ULTRAM) 50 MG tablet Take 1-2 tablets (50-100 mg total) by mouth every 6 (six) hours as needed. 20 tablet 0  . XARELTO 20 MG TABS tablet TAKE 1 TABLET EVERY DAY WITH SUPPER 30 tablet 6   No current facility-administered medications for this visit.     Allergies:   Tikosyn [dofetilide]; Zolpidem; Codeine; Codeine phosphate; and Methylprednisolone   Social  History: Social History   Socioeconomic History  . Marital status: Widowed    Spouse name: Not on file  . Number of children: 2  . Years of education: Not on file  . Highest education level: Not on file  Occupational History  . Occupation: retired  Scientific laboratory technician  . Financial resource strain: Not on file  . Food insecurity:    Worry: Not on file    Inability: Not on file  . Transportation needs:    Medical: Not on file    Non-medical: Not on file  Tobacco Use  . Smoking status: Former Smoker    Packs/day: 1.50    Years: 35.00    Pack years: 52.50    Types: Cigarettes    Last attempt to quit: 08/24/2001    Years since quitting: 17.2  . Smokeless tobacco: Never Used  Substance and Sexual Activity  . Alcohol use: No  . Drug use: No  . Sexual activity: Never    Birth control/protection: Post-menopausal  Lifestyle  . Physical activity:    Days per week: Not on file    Minutes per session: Not on file  . Stress: Not on file  Relationships  . Social connections:    Talks on phone: Not on file    Gets together: Not on file    Attends religious service: Not on file    Active member of club or organization: Not on file    Attends meetings of clubs or organizations: Not on file    Relationship status: Not on file  . Intimate partner violence:    Fear of current or ex partner: Not on file    Emotionally abused: Not on file    Physically abused: Not on file    Forced sexual activity: Not on file  Other Topics Concern  . Not on file  Social History Narrative  . Not on file    Family History: Family History  Problem Relation Age of Onset  . Dementia Sister   . Diabetes Sister   . Alzheimer's disease Sister   . Diabetes Brother        x 2  . Heart disease Brother   . Irritable bowel syndrome Brother   . Liver cancer Mother   . Diabetes Mother   . Pancreatic cancer Mother   . Diabetes Father   . Heart disease Father     Review of  Systems: All other systems  reviewed and are otherwise negative except as noted above.   Physical Exam: VS:  BP 122/68   Pulse 60   Ht 5\' 6"  (1.676 m)   Wt 228 lb (103.4 kg)   SpO2 95%   BMI 36.80 kg/m  , BMI Body mass index is 36.8 kg/m. Wt Readings from Last 3 Encounters:  11/17/18 228 lb (103.4 kg)  11/15/18 226 lb (102.5 kg)  09/20/18 228 lb (103.4 kg)    GEN- The patient is well appearing, obese female, alert and oriented x 3 today.   HEENT: normocephalic, atraumatic; sclera clear, conjunctiva pink; hearing intact; oropharynx clear; neck supple, no JVP Lymph- no cervical lymphadenopathy Lungs- Clear to ausculation bilaterally, normal work of breathing.  No wheezes, rales, rhonchi Heart- Regular rate and rhythm, no murmurs, rubs or gallops, PMI not laterally displaced GI- soft, non-tender, non-distended, bowel sounds present, no hepatosplenomegaly Extremities- no clubbing, cyanosis, or edema; DP/PT/radial pulses 2+ bilaterally MS- no significant deformity or atrophy Skin- warm and dry, no rash or lesion  Psych- euthymic mood, full affect Neuro- strength and sensation are intact   EKG:  EKG is ordered today. The ekg ordered today shows SR HR 60, PR 158, QRS 92, QTc 444  Recent Labs: 01/02/2018: Hemoglobin 12.5; Platelets 335 03/26/2018: Magnesium 2.1 08/01/2018: ALT 28; TSH 0.488 08/23/2018: BUN 18; Creatinine, Ser 1.42; Potassium 4.2; Sodium 139    Other studies Reviewed: Additional studies/ records that were reviewed today include: Echo 06/21/18 Review of the above records today demonstrates:  Echo 06/21/18 - Left ventricle: The cavity size was normal. Wall thickness was   increased in a pattern of mild LVH. Systolic function was   moderately reduced. The estimated ejection fraction was in the   range of 35% to 40%. There is akinesis of the inferoseptal   myocardium. Doppler parameters are consistent with abnormal left   ventricular relaxation (grade 1 diastolic dysfunction). - Mitral valve:  Calcified annulus. There was mild regurgitation.  Assessment and Plan:  1. Persistent atrial fibrillation She is now off amiodarone 2/2 worsening PFT Recall she failed Tikosyn 2/2 QT prolongation. Class 1c and Multaq are also not good choices for her given her reduced systolic function. CHADS2VASC score of at least 5.  Therapeutic strategies for afib including rate control and ablation were discussed in detail with the patient today. Risk, benefits, and alternatives to EP study and radiofrequency ablation for afib were also discussed in detail today. These risks include but are not limited to stroke, bleeding, vascular damage, tamponade, perforation, damage to the esophagus, lungs, and other structures, pulmonary vein stenosis, worsening renal function, and death. The patient understands these risk and wishes to proceed with ablation.   Continue Xarelto 20 mg daily and Bystolic 2.5 mg daily Encouraged lifestyle modifications including treating her OSA, regular exercise and weight loss.  2. HTN Stable, no changes today.  3. Nonischemic cardiomyopathy EF improved with restoration of sinus rhythm.  Followed in CHF clinic.  4. OSA She admits she is not compliant with CPAP because the mask is "smothering." Will refer to Dr Ron Parker for evaluation of an oral appliance.  5. Obesity Body mass index is 36.8 kg/m. Lifestyle modifications as above.   Current medicines are reviewed at length with the patient today.   The patient does not have concerns regarding her medicines.  The following changes were made today:  none  Labs/ tests ordered today include:  Orders Placed This Encounter  Procedures  . Basic  metabolic panel  . CBC  . EKG 12-Lead       Army Fossa MD 11/17/2018 12:51 PM   [facility] [address] [place] [address] [phone] (office) [phone] (fax)

## 2018-11-17 NOTE — H&P (View-Only) (Signed)
Electrophysiology Office Note Date: 11/17/2018  ID:  Cynthia Herrera April 28, 1946, MRN [id number]  PCP: Isaac Bliss, Rayford Halsted, MD Primary Cardiologist: Dr Turner/Dr Haroldine Laws Electrophysiologist: Dr Rayann Heman  CC: Follow up for atrial fibrillation  Cynthia Herrera is a 73 y.o. female seen today for routine electrophysiology followup. Since last being seen in our clinic, her amiodarone was discontinued 2/2 worsening PFTs. She has not noticed any symptoms of atrial fibrillation in the interim. Of note, she has been diagnosed with OSA but has not been able to tolerate CPAP therapy. She continues to have shortness of breath and fatigue.  She denies chest pain, palpitations, PND, orthopnea, nausea, vomiting, syncope, edema, weight gain, or early satiety.  Past Medical History:  Diagnosis Date  . Arthritis   . Breast cancer, left breast ([place]) 2002   "then treated w/chemo and radiation"  . Breast cancer, left breast ([place]) 07/14/2017   "tx'd w/mastectomy"  . Chronic back pain    h/o lumbar stenosis; "no problem since my OR" (`10/27/2017)  . Chronic systolic CHF (congestive heart failure) ([place])   . Complication of anesthesia    last surgery iv med when going to sleep"burned as injected"  . COPD (chronic obstructive pulmonary disease) ([facility])    no inhalers   . DCM (dilated cardiomyopathy) ([place])    EF 15-20% ? tachycardia induced - EF 35-40% by echo 05/2018  . Dyspnea    with exertion  . Gallstones   . GERD (gastroesophageal reflux disease)    once in a while;depends on what she eats  . History of bronchitis    "chronic when I smoked; no problem since I quit in 2002" (10/27/2017)  . Hyperlipidemia   . Hypertension    takes Losartan and Metoprolol.   . Joint pain   . Muscle spasm    takes Flexeril daily as needed   . Nocturia   . OSA (obstructive sleep apnea) 08/22/2018   Severe obstructive sleep apnea with an AHI 47.3/h and no significant central sleep apnea with moderate  oxygen desaturations as low as 79%  . Osteopenia   . Persistent atrial fibrillation    s/p TEE DCCV and repeat DCCV 11/14/2013 and 02/2018  . Personal history of colonic polyps    adenomas 03 and 08  . Pneumonia    hx  . Seasonal allergies   . Thyroid nodule   . Type II diabetes mellitus ([place])   . Vitamin D deficiency    takes Vit D   Past Surgical History:  Procedure Laterality Date  . AXILLARY LYMPH NODE DISSECTION Left 10/2001   persistent intramammary node Archie Endo 03/09/2011  . BACK SURGERY    . BREAST BIOPSY Left 2002  . BREAST BIOPSY Right 06/2017   "node bx was negative"  . BREAST BIOPSY Left 06/2017   "positive for cancer"  . BREAST LUMPECTOMY WITH RADIOACTIVE SEED LOCALIZATION Right 08/15/2017   Procedure: RIGHT BREAST LUMPECTOMY WITH RADIOACTIVE SEED LOCALIZATION;  Surgeon: Jovita Kussmaul, MD;  Location: [facility];  Service: General;  Laterality: Right;  . CARDIAC CATHETERIZATION  2015  . CARDIOVERSION N/A 11/12/2013   Procedure: CARDIOVERSION;  Surgeon: Thayer Headings, MD;  Location[facility] ENDOSCOPY;  Service: Cardiovascular;  Laterality: N/A;  10:08  Dr. Marissa Nestle, anesthesia present, [place]   60mg ,  propofol 50mg , IV for elective cardioversion....Dr. Cathie Olden delievered synch 120 joules with successful cardioversion to NSR  . CARDIOVERSION N/A 11/12/2013   Procedure: CARDIOVERSION;  Surgeon: Thayer Headings, MD;  Location: [facility] ENDOSCOPY;  Service: Cardiovascular;  Laterality: N/A;  . CARDIOVERSION N/A 11/14/2013   Procedure: CARDIOVERSION (BEDSIDE);  Surgeon: Larey Dresser, MD;  Location: [place];  Service: Cardiovascular;  Laterality: N/A;  . CARDIOVERSION N/A 02/22/2018   Procedure: CARDIOVERSION;  Surgeon: Sueanne Margarita, MD;  Location: [facility] ENDOSCOPY;  Service: Cardiovascular;  Laterality: N/A;  . CATARACT EXTRACTION W/ INTRAOCULAR LENS  IMPLANT, BILATERAL Bilateral ~ 2016  . COLONOSCOPY  2003, September 2008, April 01, 2011   adenomas 03 and 08, polyp 12, diverticulosis  .  COLONOSCOPY WITH PROPOFOL N/A 11/16/2016   Procedure: COLONOSCOPY WITH PROPOFOL;  Surgeon: Gatha Mayer, MD;  Location: [facility] ENDOSCOPY;  Service: Endoscopy;  Laterality: N/A;  . JOINT REPLACEMENT    . LAPAROSCOPIC CHOLECYSTECTOMY  2004  . LEFT AND RIGHT HEART CATHETERIZATION WITH CORONARY ANGIOGRAM N/A 02/01/2014   Procedure: LEFT AND RIGHT HEART CATHETERIZATION WITH CORONARY ANGIOGRAM;  Surgeon: Sueanne Margarita, MD;  Location: [place] CATH LAB;  Service: Cardiovascular;  Laterality: N/A;  . LUMBAR LAMINECTOMY/DECOMPRESSION MICRODISCECTOMY N/A 03/19/2014   Procedure: LUMBAR LAMINECTOMY/DECOMPRESSION MICRODISCECTOMY 4 LEVEL;  Surgeon: Kristeen Miss, MD;  Location: [place] NEURO ORS;  Service: Neurosurgery;  Laterality: N/A;  L1-2 L2-3 L3-4 L4-5 Laminectomy/Foraminotomy  . MASS EXCISION Left 08/15/2017   Procedure: EXCISION OF LEFT BREAST MASS;  Surgeon: Jovita Kussmaul, MD;  Location: [place];  Service: General;  Laterality: Left;  Marland Kitchen MASTECTOMY Left 10/27/2017  . MASTECTOMY PARTIAL / LUMPECTOMY W/ AXILLARY LYMPHADENECTOMY  05/08/2001   Archie Endo 03/09/2011  . PORT-A-CATH REMOVAL  2003  . PORTA CATH INSERTION  2002  . TEE WITHOUT CARDIOVERSION N/A 11/12/2013   Procedure: TRANSESOPHAGEAL ECHOCARDIOGRAM (TEE);  Surgeon: Thayer Headings, MD;  Location: [place];  Service: Cardiovascular;  Laterality: N/A;  pt b/p low, pt buccal membranes very dry, lips scapped, pt c/o thirst. NPO since MN and iv FLUIDS TOTAL INFUSING AT TOTAL 20ML HR....  Dr. Cathie Olden order allow NS to bolus during procedure....very dry,NS bolus 250 ml total..pt responding well to meds..  . TOTAL KNEE ARTHROPLASTY Left 2006  . TOTAL KNEE ARTHROPLASTY Right 05/10/2016   Procedure: RIGHT TOTAL KNEE ARTHROPLASTY;  Surgeon: Gaynelle Arabian, MD;  Location: [facility] ORS;  Service: Orthopedics;  Laterality: Right;  With adductor block  . TOTAL MASTECTOMY Left 10/27/2017   Procedure: LEFT MASTECTOMY;  Surgeon: Jovita Kussmaul, MD;  Location: [place];  Service: General;   Laterality: Left;  . TUBAL LIGATION      Current Outpatient Medications  Medication Sig Dispense Refill  . amoxicillin (AMOXIL) 500 MG capsule Take 2,000 mg by mouth See admin instructions. Take 2000 mg by mouth 1 hour prior to dental appointment  5  . amoxicillin-clavulanate (AUGMENTIN) 875-125 MG tablet Take 1 tablet by mouth 2 (two) times daily for 7 days. 14 tablet 0  . atorvastatin (LIPITOR) 40 MG tablet TAKE 1 TABLET AT BEDTIME 90 tablet 2  . Cholecalciferol (VITAMIN D3) 2000 units capsule Take 2,000 Units by mouth daily.    . clotrimazole-betamethasone (LOTRISONE) cream Apply 1 application topically 2 (two) times daily. 30 g 0  . fexofenadine (ALLEGRA) 180 MG tablet Take 180 mg by mouth daily as needed for allergies or rhinitis.    . fluticasone (FLONASE) 50 MCG/ACT nasal spray Place 1 spray into both nostrils daily as needed for allergies.     . furosemide (LASIX) 20 MG tablet TAKE 1 TABLET BY MOUTH TWICE A DAY 180 tablet 2  . glucose blood test strip Use to check  CBG AC and QHS. 200 each 12  . KLOR-CON M20 20 MEQ tablet Take 1 tablet (20 mEq total) by mouth daily. 90 tablet 3  . letrozole (FEMARA) 2.5 MG tablet TAKE 1 TABLET BY MOUTH EVERY DAY 90 tablet 3  . losartan (COZAAR) 25 MG tablet TAKE 1 TABLET BY MOUTH TWICE A DAY 180 tablet 3  . metFORMIN (GLUCOPHAGE) 500 MG tablet TAKE 1 TABLET TWICE A DAY WITH A MEAL 180 tablet 1  . nebivolol (BYSTOLIC) 2.5 MG tablet Take 1 tablet (2.5 mg total) by mouth daily. 90 tablet 3  . spironolactone (ALDACTONE) 25 MG tablet TAKE 1 TABLET BY MOUTH EVERY DAY 90 tablet 2  . traMADol (ULTRAM) 50 MG tablet Take 1-2 tablets (50-100 mg total) by mouth every 6 (six) hours as needed. 20 tablet 0  . XARELTO 20 MG TABS tablet TAKE 1 TABLET EVERY DAY WITH SUPPER 30 tablet 6   No current facility-administered medications for this visit.     Allergies:   Tikosyn [dofetilide]; Zolpidem; Codeine; Codeine phosphate; and Methylprednisolone   Social  History: Social History   Socioeconomic History  . Marital status: Widowed    Spouse name: Not on file  . Number of children: 2  . Years of education: Not on file  . Highest education level: Not on file  Occupational History  . Occupation: retired  Scientific laboratory technician  . Financial resource strain: Not on file  . Food insecurity:    Worry: Not on file    Inability: Not on file  . Transportation needs:    Medical: Not on file    Non-medical: Not on file  Tobacco Use  . Smoking status: Former Smoker    Packs/day: 1.50    Years: 35.00    Pack years: 52.50    Types: Cigarettes    Last attempt to quit: 08/24/2001    Years since quitting: 17.2  . Smokeless tobacco: Never Used  Substance and Sexual Activity  . Alcohol use: No  . Drug use: No  . Sexual activity: Never    Birth control/protection: Post-menopausal  Lifestyle  . Physical activity:    Days per week: Not on file    Minutes per session: Not on file  . Stress: Not on file  Relationships  . Social connections:    Talks on phone: Not on file    Gets together: Not on file    Attends religious service: Not on file    Active member of club or organization: Not on file    Attends meetings of clubs or organizations: Not on file    Relationship status: Not on file  . Intimate partner violence:    Fear of current or ex partner: Not on file    Emotionally abused: Not on file    Physically abused: Not on file    Forced sexual activity: Not on file  Other Topics Concern  . Not on file  Social History Narrative  . Not on file    Family History: Family History  Problem Relation Age of Onset  . Dementia Sister   . Diabetes Sister   . Alzheimer's disease Sister   . Diabetes Brother        x 2  . Heart disease Brother   . Irritable bowel syndrome Brother   . Liver cancer Mother   . Diabetes Mother   . Pancreatic cancer Mother   . Diabetes Father   . Heart disease Father     Review of  Systems: All other systems  reviewed and are otherwise negative except as noted above.   Physical Exam: VS:  BP 122/68   Pulse 60   Ht 5\' 6"  (1.676 m)   Wt 228 lb (103.4 kg)   SpO2 95%   BMI 36.80 kg/m  , BMI Body mass index is 36.8 kg/m. Wt Readings from Last 3 Encounters:  11/17/18 228 lb (103.4 kg)  11/15/18 226 lb (102.5 kg)  09/20/18 228 lb (103.4 kg)    GEN- The patient is well appearing, obese female, alert and oriented x 3 today.   HEENT: normocephalic, atraumatic; sclera clear, conjunctiva pink; hearing intact; oropharynx clear; neck supple, no JVP Lymph- no cervical lymphadenopathy Lungs- Clear to ausculation bilaterally, normal work of breathing.  No wheezes, rales, rhonchi Heart- Regular rate and rhythm, no murmurs, rubs or gallops, PMI not laterally displaced GI- soft, non-tender, non-distended, bowel sounds present, no hepatosplenomegaly Extremities- no clubbing, cyanosis, or edema; DP/PT/radial pulses 2+ bilaterally MS- no significant deformity or atrophy Skin- warm and dry, no rash or lesion  Psych- euthymic mood, full affect Neuro- strength and sensation are intact   EKG:  EKG is ordered today. The ekg ordered today shows SR HR 60, PR 158, QRS 92, QTc 444  Recent Labs: 01/02/2018: Hemoglobin 12.5; Platelets 335 03/26/2018: Magnesium 2.1 08/01/2018: ALT 28; TSH 0.488 08/23/2018: BUN 18; Creatinine, Ser 1.42; Potassium 4.2; Sodium 139    Other studies Reviewed: Additional studies/ records that were reviewed today include: Echo 06/21/18 Review of the above records today demonstrates:  Echo 06/21/18 - Left ventricle: The cavity size was normal. Wall thickness was   increased in a pattern of mild LVH. Systolic function was   moderately reduced. The estimated ejection fraction was in the   range of 35% to 40%. There is akinesis of the inferoseptal   myocardium. Doppler parameters are consistent with abnormal left   ventricular relaxation (grade 1 diastolic dysfunction). - Mitral valve:  Calcified annulus. There was mild regurgitation.  Assessment and Plan:  1. Persistent atrial fibrillation She is now off amiodarone 2/2 worsening PFT Recall she failed Tikosyn 2/2 QT prolongation. Class 1c and Multaq are also not good choices for her given her reduced systolic function. CHADS2VASC score of at least 5.  Therapeutic strategies for afib including rate control and ablation were discussed in detail with the patient today. Risk, benefits, and alternatives to EP study and radiofrequency ablation for afib were also discussed in detail today. These risks include but are not limited to stroke, bleeding, vascular damage, tamponade, perforation, damage to the esophagus, lungs, and other structures, pulmonary vein stenosis, worsening renal function, and death. The patient understands these risk and wishes to proceed with ablation.   Continue Xarelto 20 mg daily and Bystolic 2.5 mg daily Encouraged lifestyle modifications including treating her OSA, regular exercise and weight loss.  2. HTN Stable, no changes today.  3. Nonischemic cardiomyopathy EF improved with restoration of sinus rhythm.  Followed in CHF clinic.  4. OSA She admits she is not compliant with CPAP because the mask is "smothering." Will refer to Dr Ron Parker for evaluation of an oral appliance.  5. Obesity Body mass index is 36.8 kg/m. Lifestyle modifications as above.   Current medicines are reviewed at length with the patient today.   The patient does not have concerns regarding her medicines.  The following changes were made today:  none  Labs/ tests ordered today include:  Orders Placed This Encounter  Procedures  . Basic  metabolic panel  . CBC  . EKG 12-Lead       Army Fossa MD 11/17/2018 12:51 PM   [facility] [address] [place] [address] [phone] (office) [phone] (fax)

## 2018-11-18 LAB — BASIC METABOLIC PANEL
BUN/Creatinine Ratio: 20 (ref 12–28)
BUN: 23 mg/dL (ref 8–27)
CO2: 18 mmol/L — ABNORMAL LOW (ref 20–29)
Calcium: 9.6 mg/dL (ref 8.7–10.3)
Chloride: 106 mmol/L (ref 96–106)
Creatinine, Ser: 1.17 mg/dL — ABNORMAL HIGH (ref 0.57–1.00)
GFR calc Af Amer: 53 mL/min/{1.73_m2} — ABNORMAL LOW (ref >59)
GFR, EST NON AFRICAN AMERICAN: 46 mL/min/{1.73_m2} — AB (ref >59)
Glucose: 85 mg/dL (ref 65–99)
POTASSIUM: 4.6 mmol/L (ref 3.5–5.2)
Sodium: 143 mmol/L (ref 134–144)

## 2018-11-18 LAB — CBC
Hematocrit: 36.1 % (ref 34.0–46.6)
Hemoglobin: 11.8 g/dL (ref 11.1–15.9)
MCH: 27.9 pg (ref 26.6–33.0)
MCHC: 32.7 g/dL (ref 31.5–35.7)
MCV: 85 fL (ref 79–97)
Platelets: 386 10*3/uL (ref 150–450)
RBC: 4.23 x10E6/uL (ref 3.77–5.28)
RDW: 13.3 % (ref 11.7–15.4)
WBC: 8.1 10*3/uL (ref 3.4–10.8)

## 2018-11-20 NOTE — Progress Notes (Signed)
thanks

## 2018-11-21 ENCOUNTER — Encounter: Payer: Self-pay | Admitting: Physical Therapy

## 2018-11-21 ENCOUNTER — Ambulatory Visit: Payer: Medicare Other | Admitting: Physical Therapy

## 2018-11-21 DIAGNOSIS — M5440 Lumbago with sciatica, unspecified side: Principal | ICD-10-CM

## 2018-11-21 DIAGNOSIS — R293 Abnormal posture: Secondary | ICD-10-CM

## 2018-11-21 DIAGNOSIS — G8929 Other chronic pain: Secondary | ICD-10-CM

## 2018-11-21 DIAGNOSIS — R262 Difficulty in walking, not elsewhere classified: Secondary | ICD-10-CM

## 2018-11-21 DIAGNOSIS — M6281 Muscle weakness (generalized): Secondary | ICD-10-CM

## 2018-11-21 NOTE — Therapy (Signed)
[facility] [place], [place], [address] Phone: [phone]   Fax:  [phone]  Physical Therapy Treatment/Discharge NOte  Patient Details  Name: Cynthia Herrera MRN: [id number] Date of Birth: 07-Feb-1946 Referring Provider (PT): Kristeen Miss MD   Encounter Date: 11/21/2018  PT End of Session - 11/21/18 1529    Visit Number  3    Number of Visits  13    Date for PT Re-Evaluation  12/12/18    Authorization Type  [employer] Medicare.  Progress note on 10th visit and KX modifier on 15 th    PT Start Time  1500    PT Stop Time  1544    PT Time Calculation (min)  44 min    Activity Tolerance  Patient tolerated treatment well    Behavior During Therapy  WFL for tasks assessed/performed       Past Medical History:  Diagnosis Date  . Arthritis   . Breast cancer, left breast ([place]) 2002   "then treated w/chemo and radiation"  . Breast cancer, left breast ([place]) 07/14/2017   "tx'd w/mastectomy"  . Chronic back pain    h/o lumbar stenosis; "no problem since my OR" (`10/27/2017)  . Chronic systolic CHF (congestive heart failure) ([place])   . Complication of anesthesia    last surgery iv med when going to sleep"burned as injected"  . COPD (chronic obstructive pulmonary disease) ([facility])    no inhalers   . DCM (dilated cardiomyopathy) ([place])    EF 15-20% ? tachycardia induced - EF 35-40% by echo 05/2018  . Dyspnea    with exertion  . Gallstones   . GERD (gastroesophageal reflux disease)    once in a while;depends on what she eats  . History of bronchitis    "chronic when I smoked; no problem since I quit in 2002" (10/27/2017)  . Hyperlipidemia   . Hypertension    takes Losartan and Metoprolol.   . Joint pain   . Muscle spasm    takes Flexeril daily as needed   . Nocturia   . OSA (obstructive sleep apnea) 08/22/2018   Severe obstructive sleep apnea with an AHI 47.3/h and no significant central sleep apnea with moderate oxygen  desaturations as low as 79%  . Osteopenia   . Persistent atrial fibrillation    s/p TEE DCCV and repeat DCCV 11/14/2013 and 02/2018  . Personal history of colonic polyps    adenomas 03 and 08  . Pneumonia    hx  . Seasonal allergies   . Thyroid nodule   . Type II diabetes mellitus ([place])   . Vitamin D deficiency    takes Vit D    Past Surgical History:  Procedure Laterality Date  . AXILLARY LYMPH NODE DISSECTION Left 10/2001   persistent intramammary node Archie Endo 03/09/2011  . BACK SURGERY    . BREAST BIOPSY Left 2002  . BREAST BIOPSY Right 06/2017   "node bx was negative"  . BREAST BIOPSY Left 06/2017   "positive for cancer"  . BREAST LUMPECTOMY WITH RADIOACTIVE SEED LOCALIZATION Right 08/15/2017   Procedure: RIGHT BREAST LUMPECTOMY WITH RADIOACTIVE SEED LOCALIZATION;  Surgeon: Jovita Kussmaul, MD;  Location: [facility];  Service: General;  Laterality: Right;  . CARDIAC CATHETERIZATION  2015  . CARDIOVERSION N/A 11/12/2013   Procedure: CARDIOVERSION;  Surgeon: Thayer Headings, MD;  Location: [facility] ENDOSCOPY;  Service: Cardiovascular;  Laterality: N/A;  10:08  Dr. Marissa Nestle, anesthesia present, Lido   '60mg'$ ,  propofol  $'50mg'b$ , IV for elective cardioversion....Dr. Cathie Olden delievered synch 120 joules with successful cardioversion to NSR  . CARDIOVERSION N/A 11/12/2013   Procedure: CARDIOVERSION;  Surgeon: Thayer Headings, MD;  Location: [facility];  Service: Cardiovascular;  Laterality: N/A;  . CARDIOVERSION N/A 11/14/2013   Procedure: CARDIOVERSION (BEDSIDE);  Surgeon: Larey Dresser, MD;  Location: [facility];  Service: Cardiovascular;  Laterality: N/A;  . CARDIOVERSION N/A 02/22/2018   Procedure: CARDIOVERSION;  Surgeon: Sueanne Margarita, MD;  Location: [facility] ENDOSCOPY;  Service: Cardiovascular;  Laterality: N/A;  . CATARACT EXTRACTION W/ INTRAOCULAR LENS  IMPLANT, BILATERAL Bilateral ~ 2016  . COLONOSCOPY  2003, September 2008, April 01, 2011   adenomas 03 and 08, polyp 12, diverticulosis  . COLONOSCOPY  WITH PROPOFOL N/A 11/16/2016   Procedure: COLONOSCOPY WITH PROPOFOL;  Surgeon: Gatha Mayer, MD;  Location: [facility] ENDOSCOPY;  Service: Endoscopy;  Laterality: N/A;  . JOINT REPLACEMENT    . LAPAROSCOPIC CHOLECYSTECTOMY  2004  . LEFT AND RIGHT HEART CATHETERIZATION WITH CORONARY ANGIOGRAM N/A 02/01/2014   Procedure: LEFT AND RIGHT HEART CATHETERIZATION WITH CORONARY ANGIOGRAM;  Surgeon: Sueanne Margarita, MD;  Location: [facility] CATH LAB;  Service: Cardiovascular;  Laterality: N/A;  . LUMBAR LAMINECTOMY/DECOMPRESSION MICRODISCECTOMY N/A 03/19/2014   Procedure: LUMBAR LAMINECTOMY/DECOMPRESSION MICRODISCECTOMY 4 LEVEL;  Surgeon: Kristeen Miss, MD;  Location: [facility] NEURO ORS;  Service: Neurosurgery;  Laterality: N/A;  L1-2 L2-3 L3-4 L4-5 Laminectomy/Foraminotomy  . MASS EXCISION Left 08/15/2017   Procedure: EXCISION OF LEFT BREAST MASS;  Surgeon: Jovita Kussmaul, MD;  Location: [facility];  Service: General;  Laterality: Left;  Marland Kitchen MASTECTOMY Left 10/27/2017  . MASTECTOMY PARTIAL / LUMPECTOMY W/ AXILLARY LYMPHADENECTOMY  05/08/2001   Archie Endo 03/09/2011  . PORT-A-CATH REMOVAL  2003  . PORTA CATH INSERTION  2002  . TEE WITHOUT CARDIOVERSION N/A 11/12/2013   Procedure: TRANSESOPHAGEAL ECHOCARDIOGRAM (TEE);  Surgeon: Thayer Headings, MD;  Location: [facility];  Service: Cardiovascular;  Laterality: N/A;  pt b/p low, pt buccal membranes very dry, lips scapped, pt c/o thirst. NPO since MN and iv FLUIDS TOTAL INFUSING AT TOTAL 20ML HR....  Dr. Cathie Olden order allow NS to bolus during procedure....very dry,NS bolus 250 ml total..pt responding well to meds..  . TOTAL KNEE ARTHROPLASTY Left 2006  . TOTAL KNEE ARTHROPLASTY Right 05/10/2016   Procedure: RIGHT TOTAL KNEE ARTHROPLASTY;  Surgeon: Gaynelle Arabian, MD;  Location: [facility] ORS;  Service: Orthopedics;  Laterality: Right;  With adductor block  . TOTAL MASTECTOMY Left 10/27/2017   Procedure: LEFT MASTECTOMY;  Surgeon: Jovita Kussmaul, MD;  Location: [facility];  Service: General;  Laterality:  Left;  . TUBAL LIGATION      There were no vitals filed for this visit.  Subjective Assessment - 11/21/18 1506    Subjective  My breathing is better than last week but I still feel like it is tough to breath    Pertinent History  Bil TKA left 2006, right 2016, Breast CA x 2  Masectomy 10/2017,  A fib,   2014 4 level fusion. with [facility].  See extensive medical history, Osteopenia    Limitations  Standing;House hold activities    How long can you sit comfortably?  unlimited    How long can you stand comfortably?  20 minutes    How long can you walk comfortably?  20 minutes    Diagnostic tests  xray, MRI  of low backCt scan of hip    Patient Stated Goals  I would like to build up my  core and stand for at least an hour to get things done    Currently in Pain?  No/denies    Pain Score  1    tightness   Pain Location  Back    Pain Orientation  Left;Right;Mid    Pain Descriptors / Indicators  Aching    Pain Type  Chronic pain    Pain Onset  More than a month ago    Pain Frequency  Intermittent         [facility] PT Assessment - 11/21/18 0001      Assessment   Medical Diagnosis  Lumbar Spondylosis    Referring Provider (PT)  Kristeen Miss MD      Observation/Other Assessments   Focus on Therapeutic Outcomes (FOTO)   Foto Intake 46% limitation 54% predicted 45%      Sit to Stand   Comments  12.78 sec normal 13 or less      AROM   Overall AROM   Deficits    Lumbar Flexion  80    Lumbar Extension  10    Lumbar - Right Side Bend  13    Lumbar - Left Side Bend  25    Lumbar - Right Rotation  50% available    Lumbar - Left Rotation  50% available range      Strength   Overall Strength  Deficits    Right Hip Flexion  4-/5    Right Hip Extension  3/5    Right Hip ABduction  3-/5    Left Hip Extension  3-/5    Left Hip ABduction  4-/5    Right Knee Flexion  4+/5    Right Knee Extension  4+/5    Left Knee Flexion  4+/5    Left Knee Extension  4+/5    Lumbar Flexion  4/5    Lumbar  Extension  4/5      Palpation   Palpation comment  decreased pain over paraspinals adding movement                   [facility] Adult PT Treatment/Exercise - 11/21/18 0001      Self-Care   Self-Care  Other Self-Care Comments    Other Self-Care Comments   community wellness opportunities      Lumbar Exercises: Stretches   Single Knee to Chest Stretch  3 reps;20 seconds;Right;Left    Lower Trunk Rotation  2 reps;30 seconds    Lower Trunk Rotation Limitations  to right and to left    Other Lumbar Stretch Exercise  gastroc stretch x 3 20 sec, hamstring stretech 30 x 3 right and left      Lumbar Exercises: Supine   Ab Set  10 reps    AB Set Limitations  10 sec VC and TC    Pelvic Tilt  15 reps;5 seconds    Clam  10 reps    Clam Limitations  right and left   needs reinforcement    Bent Knee Raise  10 reps;3 seconds    Bent Knee Raise Limitations  right and left    Bridge  10 reps    Bridge Limitations  x 2    Other Supine Lumbar Exercises  sit to stand at sink 2 x 15 with VC for hip hinge               PT Short Term Goals - 11/21/18 1516      PT SHORT TERM GOAL #1  Title  Pt will be Independent with initial HEP    Baseline  Independent and pain is controlled now with initial HEP    Time  3    Period  Weeks    Status  Achieved      PT SHORT TERM GOAL #2   Title  "Report pain decrease from 8/10 to  4/10.    Baseline  0 to 1/10 today     Time  3    Period  Weeks    Status  Achieved      PT SHORT TERM GOAL #3   Title  "Demonstrate understanding of proper sitting posture and be more conscious of position and posture throughout the day.     Time  3    Period  Weeks    Status  Achieved        PT Long Term Goals - 11/21/18 1517      PT LONG TERM GOAL #1   Title  "Pt will be independent with advanced HEP.     Baseline  Pt is pleased with iniital HEP and has further medical procedures and does not want to add any at this time    Time  6    Period  Weeks     Status  Not Met      PT LONG TERM GOAL #2   Title  Pt will be made aware of community resources after PT RX for continued and safe exercise    Baseline  aware of silver sneakers and aquatics     Time  6    Period  Weeks    Status  Achieved      PT LONG TERM GOAL #3   Title  Pt will demonstrate rising from the floor in a safe and injury free technique    Baseline  Pt requested not to attempt due to having breathing issues and did not want to exert    Time  6    Period  Weeks    Status  Not Met      PT LONG TERM GOAL #4   Title  Pt will be able perform household chores such as sweeping with 3/10 pain or less for 30 minutes or more    Baseline  only able to stand for 20 minutes , Pt is able to sweep kitchen and den    Time  6    Period  Weeks    Status  Partially Met      PT LONG TERM GOAL #5   Title  "FOTO will improve from 54%limitation   to 45% limitation     indicating improved functional mobility.     Baseline  50% limitation    Time  6    Period  Weeks    Status  Partially Met      PT LONG TERM GOAL #6   Title  "Pt will improve bil hip  extensor/flexor/abduction  strength to >/= 4/5 with </= 2/10 pain to promote safety with walking/standing activities    Baseline  Pt  with to 1/10 pain but still with weakness deficits. 3/5 to 4-/5    Time  6    Period  Weeks    Status  Partially Met            Plan - 11/21/18 1542    Clinical Impression Statement  Pt enters clinic and states she would like to DC PT due to having other medical issues.  Her pain  is 1/10 and improved from evaluation with ADL/ body mechanics and basic intiial HEP. She is pleased with her current  level for now and will looking for community exercise as needed after she addresses her other issues.  ALL STG achieved , LTG not achievd or only  partially achieved due to short duration of PT Pt is pleased with PT but feels overwhelmed with  Other medical appt at this time.     Rehab Potential  Good    PT  Frequency  2x / week    PT Duration  6 weeks    PT Treatment/Interventions  ADLs/Self Care Home Management;Cryotherapy;Electrical Stimulation;Iontophoresis '4mg'$ /ml Dexamethasone;Moist Heat;Ultrasound;Gait training;Stair training;Functional mobility training;Neuromuscular re-education;Therapeutic exercise;Therapeutic activities;Manual techniques;Patient/family education;Passive range of motion;Dry needling;Taping;Joint Manipulations    PT Next Visit Plan  added some to HEP and DC    Consulted and Agree with Plan of Care  Patient       Patient will benefit from skilled therapeutic intervention in order to improve the following deficits and impairments:  Pain, Obesity, Postural dysfunction, Improper body mechanics, Decreased activity tolerance, Decreased mobility, Decreased range of motion, Difficulty walking, Decreased strength, Hypomobility, Impaired flexibility, Increased muscle spasms, Increased fascial restricitons  Visit Diagnosis: Chronic midline low back pain with sciatica, sciatica laterality unspecified  Muscle weakness (generalized)  Abnormal posture  Difficulty in walking, not elsewhere classified     Problem List Patient Active Problem List   Diagnosis Date Noted  . OSA (obstructive sleep apnea) 08/22/2018  . Trochanteric bursitis, right hip 07/14/2018  . Visit for monitoring Tikosyn therapy 03/21/2018  . Candidiasis of skin 01/05/2018  . Menopausal syndrome 01/05/2018  . Postmenopausal bleeding 01/05/2018  . Postoperative breast asymmetry 12/02/2017  . Recurrent breast cancer, left ([place]) 10/27/2017  . 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 History of left breast cancer 09/19/2017  . Malignant neoplasm of central portion of left breast in female, estrogen receptor positive ([place]) 09/01/2017  . Diabetes mellitus with peripheral vascular disease ([place]) 05/03/2016  . Diabetes mellitus with coincident  hypertension ([place]) 05/03/2016  . 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 Excessive daytime sleepiness 03/12/2015  . Lumbar stenosis 03/19/2014  . Persistent atrial fibrillation   . DCM (dilated cardiomyopathy) ([place])   . Chronic combined systolic and diastolic CHF (congestive heart failure) ([place])   . Chronic anticoagulation- Xarelto 11/16/2013  . Family history of coronary artery disease 11/08/2013  . Coronary artery calcification seen on CAT scan 11/08/2013  . Spinal stenosis of lumbar region 12/18/2012  . BACK PAIN 01/08/2010  . BACK PAIN WITH RADICULOPATHY 02/28/2008  . OSTEOPENIA 05/17/2007  . Dyslipidemia 05/03/2007  . Essential hypertension 05/03/2007  . COPD 05/03/2007  . BREAST CANCER, HX OF 05/03/2007  . History of colonic polyps 05/03/2007    Voncille Lo, PT Certified Exercise Expert for the Aging Adult  11/21/18 5:28 PM Phone: [phone] Fax: [facility] [address] [place], [place], [address] Phone: [phone]   Fax:  [phone]  Name: Cynthia Herrera MRN: [id number] Date of Birth: 27-May-1946

## 2018-11-21 NOTE — Patient Instructions (Addendum)
Bridge   Lie back, legs bent. Inhale, pressing hips up. Keeping ribs in, lengthen lower back. Exhale, rolling down along spine from top. Repeat _8 x 2___ times. Do __1__ sessions per day. Hold 3 sec    http://orth.exer.us/134   Copyright  VHI. All rights reserved. Knee to Chest (Flexion)   Pull knee toward chest. Feel stretch in lower back or buttock area. Breathing deeply, Hold __20__ seconds. Repeat with other knee. Repeat __2__ times. Do __1__ sessions per day.   http://orth.exer.us/122   Copyright  VHI. All rights reserved.  Supine: Leg Stretch With Strap (Basic)   Lie on back with one knee bent, foot flat on floor. Hook strap around other foot. Straighten knee. Keep knee level with other knee. Hold _20__ seconds. Relax leg completely down to floor.  Repeat 3___ times per session. Do _1__ sessions per day.     Copyright  VHI. All rights reserved.  Straight Leg Calf Stretch (Gastroc)   Put palms against wall, one leg forward and bent. With other leg back straight and heel flat on floor, lean into wall. Hold _30___ seconds. Change legs and repeat. Repeat _3___ times. Do _1___ sessions per day.  http://gt2.exer.us/419   Copyright  VHI. All rights reserved.       Voncille Lo, PT Certified Exercise Expert for the Aging Adult  11/21/18 3:11 PM Phone: [phone] Fax: [phone]

## 2018-11-23 ENCOUNTER — Ambulatory Visit: Payer: Medicare Other | Admitting: Physical Therapy

## 2018-11-28 ENCOUNTER — Encounter: Payer: Medicare Other | Admitting: Physical Therapy

## 2018-11-29 ENCOUNTER — Telehealth: Payer: Self-pay | Admitting: Internal Medicine

## 2018-11-29 NOTE — Telephone Encounter (Signed)
New Message        Patient is suppose to have a "Ablation" on 12/12/2018 she is sick and would like to know what she should do. Pls call and advise.

## 2018-11-29 NOTE — Telephone Encounter (Signed)
Left detailed message per DPR.  Advised of message left for this nurse.  Advised if Pt had a fever they day before the ablation she should call to cancel.  If no fever Pt is ok to go ahead with ablation.  Left this nurse name and # for call back if any additional questions.

## 2018-11-30 ENCOUNTER — Encounter: Payer: Medicare Other | Admitting: Physical Therapy

## 2018-12-01 NOTE — Telephone Encounter (Signed)
Patient had a 10 week follow up appointment scheduled for 01/23/19. Patient understands she no longer needs this appointment for insurance compliance because she lost her cpap for noncompliance. Patient states she is going to see Dr Ron Parker for an oral appliance. Patient was grateful for the call and thanked me.

## 2018-12-04 ENCOUNTER — Telehealth (HOSPITAL_COMMUNITY): Payer: Self-pay | Admitting: Emergency Medicine

## 2018-12-04 ENCOUNTER — Ambulatory Visit: Payer: Medicare Other | Admitting: Internal Medicine

## 2018-12-04 NOTE — Telephone Encounter (Signed)
Reaching out to patient to offer assistance regarding upcoming cardiac imaging study; pt verbalizes understanding of appt date/time, parking situation and where to check in, pre-test NPO status and medications ordered, and verified current allergies; name and call back number provided for further questions should they arise Cynthia Bond RN Navigator Cardiac Imaging [facility] [phone] office [phone] cell  Skipping metformin on Tuesday + Wednesday Skipping spironolactone/furosemide Wednesday

## 2018-12-06 ENCOUNTER — Ambulatory Visit (HOSPITAL_COMMUNITY): Admission: RE | Admit: 2018-12-06 | Payer: Medicare Other | Source: Ambulatory Visit

## 2018-12-06 ENCOUNTER — Encounter (HOSPITAL_COMMUNITY): Payer: Self-pay

## 2018-12-06 ENCOUNTER — Ambulatory Visit (HOSPITAL_COMMUNITY)
Admission: RE | Admit: 2018-12-06 | Discharge: 2018-12-06 | Disposition: A | Discharge status: Home / Self Care | Payer: Medicare Other | Source: Ambulatory Visit | Attending: Internal Medicine | Admitting: Internal Medicine

## 2018-12-06 DIAGNOSIS — I4819 Other persistent atrial fibrillation: Secondary | ICD-10-CM | POA: Insufficient documentation

## 2018-12-06 MED ORDER — IOPAMIDOL (ISOVUE-370) INJECTION 76%
80.0000 mL | Freq: Once | INTRAVENOUS | Status: AC | PRN
  Administered 2018-12-06: 80 mL via INTRAVENOUS

## 2018-12-11 ENCOUNTER — Telehealth: Payer: Self-pay | Admitting: Internal Medicine

## 2018-12-11 NOTE — Anesthesia Preprocedure Evaluation (Addendum)
Anesthesia Evaluation  Patient identified by MRN, date of birth, ID band Patient awake    Reviewed: Allergy & Precautions, H&P , NPO status , Patient's Chart, lab work & pertinent test results, reviewed documented beta blocker date and time   Airway Mallampati: II  TM Distance: >3 FB Neck ROM: Full    Dental no notable dental hx. (+) Teeth Intact, Dental Advisory Given   Pulmonary sleep apnea , COPD, former smoker,    Pulmonary exam normal breath sounds clear to auscultation       Cardiovascular Exercise Tolerance: Good hypertension, Pt. on medications and Pt. on home beta blockers + Peripheral Vascular Disease and +CHF  + dysrhythmias Atrial Fibrillation  Rhythm:Regular Rate:Normal     Neuro/Psych negative neurological ROS  negative psych ROS   GI/Hepatic Neg liver ROS, GERD  ,  Endo/Other  diabetes, Type 2, Oral Hypoglycemic AgentsMorbid obesity  Renal/GU negative Renal ROS  negative genitourinary   Musculoskeletal  (+) Arthritis , Osteoarthritis,    Abdominal   Peds  Hematology negative hematology ROS (+)   Anesthesia Other Findings   Reproductive/Obstetrics negative OB ROS                            Anesthesia Physical Anesthesia Plan  ASA: III  Anesthesia Plan: General   Post-op Pain Management:    Induction: Intravenous  PONV Risk Score and Plan: 4 or greater and Ondansetron, Dexamethasone and Midazolam  Airway Management Planned: Oral ETT  Additional Equipment:   Intra-op Plan:   Post-operative Plan: Extubation in OR  Informed Consent: I have reviewed the patients History and Physical, chart, labs and discussed the procedure including the risks, benefits and alternatives for the proposed anesthesia with the patient or authorized representative who has indicated his/her understanding and acceptance.     Dental advisory given  Plan Discussed with: CRNA  Anesthesia  Plan Comments:         Anesthesia Quick Evaluation

## 2018-12-11 NOTE — Telephone Encounter (Signed)
° ° °  Patient has additional questions regarding upcoming procedure on 2/18

## 2018-12-11 NOTE — Telephone Encounter (Signed)
Returned call to Pt.  Reiterated written instructions.  Pt states "instructions were so easy I thought I was missing something"  All questions answered.

## 2018-12-12 ENCOUNTER — Encounter (HOSPITAL_COMMUNITY): Payer: Self-pay | Admitting: Certified Registered"

## 2018-12-12 ENCOUNTER — Ambulatory Visit (HOSPITAL_COMMUNITY): Payer: Medicare Other | Admitting: Certified Registered"

## 2018-12-12 ENCOUNTER — Encounter (HOSPITAL_COMMUNITY)
Admission: RE | Disposition: A | Discharge status: Home / Self Care | Payer: Self-pay | Source: Home / Self Care | Attending: Internal Medicine

## 2018-12-12 ENCOUNTER — Other Ambulatory Visit: Payer: Self-pay

## 2018-12-12 ENCOUNTER — Ambulatory Visit (HOSPITAL_COMMUNITY)
Admission: RE | Admit: 2018-12-12 | Discharge: 2018-12-12 | Disposition: A | Discharge status: Home / Self Care | Payer: Medicare Other | Attending: Internal Medicine | Admitting: Internal Medicine

## 2018-12-12 DIAGNOSIS — Z833 Family history of diabetes mellitus: Secondary | ICD-10-CM | POA: Insufficient documentation

## 2018-12-12 DIAGNOSIS — E785 Hyperlipidemia, unspecified: Secondary | ICD-10-CM | POA: Diagnosis not present

## 2018-12-12 DIAGNOSIS — K219 Gastro-esophageal reflux disease without esophagitis: Secondary | ICD-10-CM | POA: Insufficient documentation

## 2018-12-12 DIAGNOSIS — Z7984 Long term (current) use of oral hypoglycemic drugs: Secondary | ICD-10-CM | POA: Insufficient documentation

## 2018-12-12 DIAGNOSIS — E669 Obesity, unspecified: Secondary | ICD-10-CM | POA: Insufficient documentation

## 2018-12-12 DIAGNOSIS — Z7901 Long term (current) use of anticoagulants: Secondary | ICD-10-CM | POA: Diagnosis not present

## 2018-12-12 DIAGNOSIS — Z885 Allergy status to narcotic agent status: Secondary | ICD-10-CM | POA: Diagnosis not present

## 2018-12-12 DIAGNOSIS — Z9012 Acquired absence of left breast and nipple: Secondary | ICD-10-CM | POA: Insufficient documentation

## 2018-12-12 DIAGNOSIS — E119 Type 2 diabetes mellitus without complications: Secondary | ICD-10-CM | POA: Insufficient documentation

## 2018-12-12 DIAGNOSIS — I4819 Other persistent atrial fibrillation: Secondary | ICD-10-CM | POA: Diagnosis present

## 2018-12-12 DIAGNOSIS — M1990 Unspecified osteoarthritis, unspecified site: Secondary | ICD-10-CM | POA: Insufficient documentation

## 2018-12-12 DIAGNOSIS — G4733 Obstructive sleep apnea (adult) (pediatric): Secondary | ICD-10-CM | POA: Diagnosis not present

## 2018-12-12 DIAGNOSIS — M8580 Other specified disorders of bone density and structure, unspecified site: Secondary | ICD-10-CM | POA: Diagnosis not present

## 2018-12-12 DIAGNOSIS — Z87891 Personal history of nicotine dependence: Secondary | ICD-10-CM | POA: Insufficient documentation

## 2018-12-12 DIAGNOSIS — I420 Dilated cardiomyopathy: Secondary | ICD-10-CM | POA: Diagnosis not present

## 2018-12-12 DIAGNOSIS — I110 Hypertensive heart disease with heart failure: Secondary | ICD-10-CM | POA: Diagnosis not present

## 2018-12-12 DIAGNOSIS — Z8249 Family history of ischemic heart disease and other diseases of the circulatory system: Secondary | ICD-10-CM | POA: Diagnosis not present

## 2018-12-12 DIAGNOSIS — Z6836 Body mass index (BMI) 36.0-36.9, adult: Secondary | ICD-10-CM | POA: Diagnosis not present

## 2018-12-12 DIAGNOSIS — J449 Chronic obstructive pulmonary disease, unspecified: Secondary | ICD-10-CM | POA: Diagnosis not present

## 2018-12-12 DIAGNOSIS — Z9851 Tubal ligation status: Secondary | ICD-10-CM | POA: Insufficient documentation

## 2018-12-12 DIAGNOSIS — E559 Vitamin D deficiency, unspecified: Secondary | ICD-10-CM | POA: Diagnosis not present

## 2018-12-12 DIAGNOSIS — Z888 Allergy status to other drugs, medicaments and biological substances status: Secondary | ICD-10-CM | POA: Insufficient documentation

## 2018-12-12 DIAGNOSIS — I5022 Chronic systolic (congestive) heart failure: Secondary | ICD-10-CM | POA: Insufficient documentation

## 2018-12-12 DIAGNOSIS — Z79899 Other long term (current) drug therapy: Secondary | ICD-10-CM | POA: Diagnosis not present

## 2018-12-12 HISTORY — PX: ATRIAL FIBRILLATION ABLATION: EP1191

## 2018-12-12 LAB — GLUCOSE, CAPILLARY
Glucose-Capillary: 137 mg/dL — ABNORMAL HIGH (ref 70–99)
Glucose-Capillary: 139 mg/dL — ABNORMAL HIGH (ref 70–99)

## 2018-12-12 LAB — POCT ACTIVATED CLOTTING TIME
ACTIVATED CLOTTING TIME: 290 s
Activated Clotting Time: 329 seconds
Activated Clotting Time: 362 seconds
Activated Clotting Time: 186 seconds

## 2018-12-12 SURGERY — ATRIAL FIBRILLATION ABLATION
Anesthesia: General

## 2018-12-12 MED ORDER — HEPARIN (PORCINE) IN NACL 1000-0.9 UT/500ML-% IV SOLN
INTRAVENOUS | Status: DC | PRN
  Administered 2018-12-12: 500 mL

## 2018-12-12 MED ORDER — BUPIVACAINE HCL (PF) 0.25 % IJ SOLN
INTRAMUSCULAR | Status: DC | PRN
  Administered 2018-12-12: 30 mL

## 2018-12-12 MED ORDER — ISOPROTERENOL HCL 0.2 MG/ML IJ SOLN
INTRAVENOUS | Status: DC | PRN
  Administered 2018-12-12: 10 ug/min via INTRAVENOUS

## 2018-12-12 MED ORDER — DEXAMETHASONE SODIUM PHOSPHATE 10 MG/ML IJ SOLN
INTRAMUSCULAR | Status: DC | PRN
  Administered 2018-12-12: 10 mg via INTRAVENOUS

## 2018-12-12 MED ORDER — HEPARIN SODIUM (PORCINE) 1000 UNIT/ML IJ SOLN
INTRAMUSCULAR | Status: DC | PRN
  Administered 2018-12-12: 2000 [IU] via INTRAVENOUS
  Administered 2018-12-12: 5000 [IU] via INTRAVENOUS

## 2018-12-12 MED ORDER — SODIUM CHLORIDE 0.9% FLUSH: 3.0000 mL | Freq: Two times a day (BID) | INTRAVENOUS | Status: DC

## 2018-12-12 MED ORDER — PANTOPRAZOLE SODIUM 40 MG PO TBEC: 40.0000 mg | DELAYED_RELEASE_TABLET | Freq: Every day | ORAL | 0 refills | Status: DC

## 2018-12-12 MED ORDER — HEPARIN SODIUM (PORCINE) 1000 UNIT/ML IJ SOLN
INTRAMUSCULAR | Status: AC
  Filled 2018-12-12: qty 1

## 2018-12-12 MED ORDER — FENTANYL CITRATE (PF) 250 MCG/5ML IJ SOLN
INTRAMUSCULAR | Status: DC | PRN
  Administered 2018-12-12: 25 ug via INTRAVENOUS
  Administered 2018-12-12: 75 ug via INTRAVENOUS

## 2018-12-12 MED ORDER — ROCURONIUM BROMIDE 50 MG/5ML IV SOSY
PREFILLED_SYRINGE | INTRAVENOUS | Status: DC | PRN
  Administered 2018-12-12: 50 mg via INTRAVENOUS
  Administered 2018-12-12: 20 mg via INTRAVENOUS

## 2018-12-12 MED ORDER — SODIUM CHLORIDE 0.9 % IV SOLN
INTRAVENOUS | Status: DC | PRN
  Administered 2018-12-12: 30 ug/min via INTRAVENOUS

## 2018-12-12 MED ORDER — BUPIVACAINE HCL (PF) 0.25 % IJ SOLN
INTRAMUSCULAR | Status: AC
  Filled 2018-12-12: qty 30

## 2018-12-12 MED ORDER — HEPARIN SODIUM (PORCINE) 1000 UNIT/ML IJ SOLN
INTRAMUSCULAR | Status: DC | PRN
  Administered 2018-12-12: 12000 [IU] via INTRAVENOUS
  Administered 2018-12-12: 1000 [IU] via INTRAVENOUS

## 2018-12-12 MED ORDER — ONDANSETRON HCL 4 MG/2ML IJ SOLN
INTRAMUSCULAR | Status: DC | PRN
  Administered 2018-12-12: 4 mg via INTRAVENOUS

## 2018-12-12 MED ORDER — SODIUM CHLORIDE 0.9 % IV SOLN
INTRAVENOUS | Status: DC
  Administered 2018-12-12: 06:00:00 via INTRAVENOUS

## 2018-12-12 MED ORDER — ISOPROTERENOL HCL 0.2 MG/ML IJ SOLN
INTRAMUSCULAR | Status: AC
  Filled 2018-12-12: qty 5

## 2018-12-12 MED ORDER — LIDOCAINE 2% (20 MG/ML) 5 ML SYRINGE
INTRAMUSCULAR | Status: DC | PRN
  Administered 2018-12-12: 60 mg via INTRAVENOUS

## 2018-12-12 MED ORDER — ACETAMINOPHEN 325 MG PO TABS
650.0000 mg | ORAL_TABLET | ORAL | Status: DC | PRN
  Filled 2018-12-12: qty 2

## 2018-12-12 MED ORDER — PROTAMINE SULFATE 10 MG/ML IV SOLN
INTRAVENOUS | Status: DC | PRN
  Administered 2018-12-12: 35 mg via INTRAVENOUS
  Administered 2018-12-12: 5 mg via INTRAVENOUS

## 2018-12-12 MED ORDER — SODIUM CHLORIDE 0.9% FLUSH: 3.0000 mL | INTRAVENOUS | Status: DC | PRN

## 2018-12-12 MED ORDER — ACETAMINOPHEN 500 MG PO TABS
1000.0000 mg | ORAL_TABLET | Freq: Once | ORAL | Status: AC
  Administered 2018-12-12: 1000 mg via ORAL
  Filled 2018-12-12 (×2): qty 2

## 2018-12-12 MED ORDER — HEPARIN (PORCINE) IN NACL 1000-0.9 UT/500ML-% IV SOLN
INTRAVENOUS | Status: AC
  Filled 2018-12-12: qty 500

## 2018-12-12 MED ORDER — PHENYLEPHRINE 40 MCG/ML (10ML) SYRINGE FOR IV PUSH (FOR BLOOD PRESSURE SUPPORT)
PREFILLED_SYRINGE | INTRAVENOUS | Status: DC | PRN
  Administered 2018-12-12 (×5): 80 ug via INTRAVENOUS

## 2018-12-12 MED ORDER — ONDANSETRON HCL 4 MG/2ML IJ SOLN: 4.0000 mg | Freq: Four times a day (QID) | INTRAMUSCULAR | Status: DC | PRN

## 2018-12-12 MED ORDER — SODIUM CHLORIDE 0.9 % IV SOLN: 250.0000 mL | INTRAVENOUS | Status: DC | PRN

## 2018-12-12 MED ORDER — PROPOFOL 10 MG/ML IV BOLUS
INTRAVENOUS | Status: DC | PRN
  Administered 2018-12-12: 80 mg via INTRAVENOUS
  Administered 2018-12-12: 30 mg via INTRAVENOUS
  Administered 2018-12-12: 20 mg via INTRAVENOUS

## 2018-12-12 MED ORDER — SUGAMMADEX SODIUM 200 MG/2ML IV SOLN
INTRAVENOUS | Status: DC | PRN
  Administered 2018-12-12: 200 mg via INTRAVENOUS

## 2018-12-12 SURGICAL SUPPLY — 18 items
BLANKET WARM UNDERBOD FULL ACC (MISCELLANEOUS) ×2 IMPLANT
CATH MAPPNG PENTARAY F 2-6-2MM (CATHETERS) IMPLANT
CATH NAVISTAR SMARTTOUCH DF (ABLATOR) ×2 IMPLANT
CATH SOUNDSTAR 3D IMAGING (CATHETERS) ×2 IMPLANT
CATH WEBSTER BI DIR CS D-F CRV (CATHETERS) ×2 IMPLANT
COVER SWIFTLINK CONNECTOR (BAG) ×2 IMPLANT
NDL BAYLIS TRANSSEPTAL 71CM (NEEDLE) IMPLANT
NEEDLE BAYLIS TRANSSEPTAL 71CM (NEEDLE) ×3 IMPLANT
PACK EP LATEX FREE (CUSTOM PROCEDURE TRAY) ×3
PACK EP LF (CUSTOM PROCEDURE TRAY) ×1 IMPLANT
PAD PRO RADIOLUCENT 2001M-C (PAD) ×3 IMPLANT
PATCH CARTO3 (PAD) ×2 IMPLANT
PENTARAY F 2-6-2MM (CATHETERS) ×3
SHEATH AVANTI 11F 11CM (SHEATH) ×2 IMPLANT
SHEATH PINNACLE 7F 10CM (SHEATH) ×4 IMPLANT
SHEATH PINNACLE 9F 10CM (SHEATH) ×2 IMPLANT
SHEATH SWARTZ TS SL2 63CM 8.5F (SHEATH) ×2 IMPLANT
TUBING SMART ABLATE COOLFLOW (TUBING) ×2 IMPLANT

## 2018-12-12 NOTE — Interval H&P Note (Signed)
History and Physical Interval Note:  12/12/2018 7:23 AM  Cynthia Herrera  has presented today for surgery, with the diagnosis of afib  The various methods of treatment have been discussed with the patient and family. After consideration of risks, benefits and other options for treatment, the patient has consented to  Procedure(s): ATRIAL FIBRILLATION ABLATION (N/A) as a surgical intervention .  The patient's history has been reviewed, patient examined, no change in status, stable for surgery.  I have reviewed the patient's chart and labs.  Questions were answered to the patient's satisfaction.     Risk, benefits, and alternatives to EP study and radiofrequency ablation for afib were also discussed in detail today. These risks include but are not limited to stroke, bleeding, vascular damage, tamponade, perforation, damage to the esophagus, lungs, and other structures, pulmonary vein stenosis, worsening renal function, and death. The patient understands these risk and wishes to proceed.   Cardiac CT reviewed with patient and family.  She reports compliance with xarelto without interruption.  Thompson Grayer MD, [facility] 12/12/2018 7:23 AM

## 2018-12-12 NOTE — Progress Notes (Addendum)
Site area: RFV x 3 Site Prior to Removal:  Level 0 Pressure Applied For: 27 min Manual:   yes Patient Status During Pull:  stable Post Pull Site:  Level 0 Post Pull Instructions Given: yes  Post Pull Pulses Present: palpable Dressing Applied:  clear Bedrest begins @ 1110 till 1710 Comments:

## 2018-12-12 NOTE — Transfer of Care (Signed)
Immediate Anesthesia Transfer of Care Note  Patient: Cynthia Herrera  Procedure(s) Performed: ATRIAL FIBRILLATION ABLATION (N/A )  Patient Location: Cath Lab  Anesthesia Type:General  Level of Consciousness: awake and alert   Airway & Oxygen Therapy: Patient Spontanous Breathing and Patient connected to nasal cannula oxygen  Post-op Assessment: Report given to RN and Post -op Vital signs reviewed and stable  Post vital signs: Reviewed and stable  Last Vitals:  Vitals Value Taken Time  BP 97/40 12/12/2018 10:45 AM  Temp    Pulse 63 12/12/2018 10:46 AM  Resp 21 12/12/2018 10:46 AM  SpO2 88 % 12/12/2018 10:46 AM  Vitals shown include unvalidated device data.  Last Pain:  Vitals:   12/12/18 1010  TempSrc: Temporal  PainSc: 0-No pain      Patients Stated Pain Goal: 3 (08/27/14 9458)  Complications: No apparent anesthesia complications. Dr. Ola Spurr notified of blood pressure on low end of normal. Patient alert and oriented, denies symptoms of low BP.

## 2018-12-12 NOTE — Anesthesia Procedure Notes (Signed)
Procedure Name: Intubation Date/Time: 12/12/2018 7:48 AM Performed by: Imagene Riches, CRNA Pre-anesthesia Checklist: Patient identified, Emergency Drugs available, Suction available and Patient being monitored Patient Re-evaluated:Patient Re-evaluated prior to induction Oxygen Delivery Method: Circle System Utilized Preoxygenation: Pre-oxygenation with 100% oxygen Induction Type: IV induction Ventilation: Mask ventilation without difficulty Laryngoscope Size: Glidescope and 3 Grade View: Grade I Tube type: Oral Tube size: 7.5 mm Number of attempts: 1 Airway Equipment and Method: Stylet and Oral airway Placement Confirmation: ETT inserted through vocal cords under direct vision,  positive ETCO2 and breath sounds checked- equal and bilateral Secured at: 22 cm Tube secured with: Tape Dental Injury: Teeth and Oropharynx as per pre-operative assessment  Comments: First DL with miller 3. Grade 3 view, no attempt to pass ETT. Given poor dentition, opted for glide scope. Patient mask ventilated with oral airway. Second DL with Glide scope go size 3, ETT easily passed through vocal cords.

## 2018-12-12 NOTE — Progress Notes (Signed)
Groin bleed upon arrival in short stay. Pressure x 15 min. Bedrest now 1145 till 16. Groin remains level zero.

## 2018-12-12 NOTE — Discharge Instructions (Signed)
Post procedure care instructions No driving for 4 days. No lifting over 5 lbs for 1 week. No vigorous or sexual activity for 1 week. You may return to work on 12/19/2018. Keep procedure site clean & dry. If you notice increased pain, swelling, bleeding or pus, call/return!  You may shower, but no soaking baths/hot tubs/pools for 1 week.   You have an appointment set up with the [facility].  Multiple studies have shown that being followed by a dedicated atrial fibrillation clinic in addition to the standard care you receive from your other physicians improves health. We believe that enrollment in the atrial fibrillation clinic will allow [place] to better care for you.   The phone number to the [facility] is [phone]. The clinic is staffed Monday through Friday from 8:30am to 5pm.  Parking Directions: The clinic is located in the Heart and Vascular Building connected to [facility]. 1)From [address] turn on to [address] and go to the 3rd entrance  (Heart and Vascular entrance) on the right. 2)Look to the right for Heart &Vascular Parking Garage. 3)A code for the entrance is required please call the clinic to receive this.   4)Take the elevators to the 1st floor. Registration is in the room with the glass walls at the end of the hallway.  If you have any trouble parking or locating the clinic, please dont hesitate to call [phone].   Cardiac Ablation, Care After This sheet gives you information about how to care for yourself after your procedure. Your health care provider may also give you more specific instructions. If you have problems or questions, contact your health care provider. What can I expect after the procedure? After the procedure, it is common to have:  Bruising around your puncture site.  Tenderness around your puncture site.  Skipped heartbeats.  Tiredness (fatigue). Follow these instructions at home: Puncture site  care   Follow instructions from your health care provider about how to take care of your puncture site. Make sure you: ? Wash your hands with soap and water before you change your bandage (dressing). If soap and water are not available, use hand sanitizer. ? Change your dressing as told by your health care provider. ? Leave stitches (sutures), skin glue, or adhesive strips in place. These skin closures may need to stay in place for up to 2 weeks. If adhesive strip edges start to loosen and curl up, you may trim the loose edges. Do not remove adhesive strips completely unless your health care provider tells you to do that.  Check your puncture site every day for signs of infection. Check for: ? Redness, swelling, or pain. ? Fluid or blood. If your puncture site starts to bleed, lie down on your back, apply firm pressure to the area, and contact your health care provider. ? Warmth. ? Pus or a bad smell. Driving  Ask your health care provider when it is safe for you to drive again after the procedure.  Do not drive or use heavy machinery while taking prescription pain medicine.  Do not drive for 24 hours if you were given a medicine to help you relax (sedative) during your procedure. Activity  Avoid activities that take a lot of effort for at least 3 days after your procedure.  Do not lift anything that is heavier than 10 lb (4.5 kg), or the limit that you are told, until your health care provider says that it is safe.  Return to  your normal activities as told by your health care provider. Ask your health care provider what activities are safe for you. General instructions  Take over-the-counter and prescription medicines only as told by your health care provider.  Do not use any products that contain nicotine or tobacco, such as cigarettes and e-cigarettes. If you need help quitting, ask your health care provider.  Do not take baths, swim, or use a hot tub until your health care  provider approves.  Do not drink alcohol for 24 hours after your procedure.  Keep all follow-up visits as told by your health care provider. This is important. Contact a health care provider if:  You have redness, mild swelling, or pain around your puncture site.  You have fluid or blood coming from your puncture site that stops after applying firm pressure to the area.  Your puncture site feels warm to the touch.  You have pus or a bad smell coming from your puncture site.  You have a fever.  You have chest pain or discomfort that spreads to your neck, jaw, or arm.  You are sweating a lot.  You feel nauseous.  You have a fast or irregular heartbeat.  You have shortness of breath.  You are dizzy or light-headed and feel the need to lie down.  You have pain or numbness in the arm or leg closest to your puncture site. Get help right away if:  Your puncture site suddenly swells.  Your puncture site is bleeding and the bleeding does not stop after applying firm pressure to the area. These symptoms may represent a serious problem that is an emergency. Do not wait to see if the symptoms will go away. Get medical help right away. Call your local emergency services (911 in the [place] Do not drive yourself to the hospital. Summary  After the procedure, it is normal to have bruising and tenderness at the puncture site in your groin, neck, or forearm.  Check your puncture site every day for signs of infection.  Get help right away if your puncture site is bleeding and the bleeding does not stop after applying firm pressure to the area. This is a medical emergency. This information is not intended to replace advice given to you by your health care provider. Make sure you discuss any questions you have with your health care provider. Document Released: 01/20/2017 Document Revised: 01/20/2017 Document Reviewed: 01/20/2017 Elsevier Interactive Patient Education  2019 [employer].

## 2018-12-12 NOTE — Anesthesia Postprocedure Evaluation (Signed)
Anesthesia Post Note  Patient: Cynthia Herrera  Procedure(s) Performed: ATRIAL FIBRILLATION ABLATION (N/A )     Patient location during evaluation: PACU Anesthesia Type: General Level of consciousness: awake and alert Pain management: pain level controlled Vital Signs Assessment: post-procedure vital signs reviewed and stable Respiratory status: spontaneous breathing, nonlabored ventilation, respiratory function stable and patient connected to nasal cannula oxygen Cardiovascular status: blood pressure returned to baseline and stable Postop Assessment: no apparent nausea or vomiting Anesthetic complications: no    Last Vitals:  Vitals:   12/12/18 1030 12/12/18 1114  BP: (!) 112/49   Pulse: 67   Resp: 14   Temp:  (!) 36.4 C  SpO2: 94%     Last Pain:  Vitals:   12/12/18 1114  TempSrc: Temporal  PainSc:                  Montrell Cessna,W. EDMOND

## 2018-12-13 ENCOUNTER — Encounter (HOSPITAL_COMMUNITY): Payer: Self-pay | Admitting: Internal Medicine

## 2018-12-25 ENCOUNTER — Ambulatory Visit (HOSPITAL_BASED_OUTPATIENT_CLINIC_OR_DEPARTMENT_OTHER)
Admission: RE | Admit: 2018-12-25 | Discharge: 2018-12-25 | Disposition: A | Discharge status: Home / Self Care | Payer: Medicare Other | Source: Ambulatory Visit | Attending: Internal Medicine | Admitting: Internal Medicine

## 2018-12-25 ENCOUNTER — Other Ambulatory Visit (HOSPITAL_COMMUNITY): Payer: Self-pay

## 2018-12-25 ENCOUNTER — Encounter (HOSPITAL_COMMUNITY): Payer: Self-pay | Admitting: Internal Medicine

## 2018-12-25 ENCOUNTER — Ambulatory Visit (HOSPITAL_COMMUNITY)
Admission: RE | Admit: 2018-12-25 | Discharge: 2018-12-25 | Disposition: A | Discharge status: Home / Self Care | Payer: Medicare Other | Source: Ambulatory Visit | Attending: Internal Medicine | Admitting: Internal Medicine

## 2018-12-25 VITALS — BP 128/64 | HR 67 | Wt 225.8 lb

## 2018-12-25 DIAGNOSIS — Z923 Personal history of irradiation: Secondary | ICD-10-CM | POA: Diagnosis not present

## 2018-12-25 DIAGNOSIS — E119 Type 2 diabetes mellitus without complications: Secondary | ICD-10-CM | POA: Diagnosis not present

## 2018-12-25 DIAGNOSIS — K219 Gastro-esophageal reflux disease without esophagitis: Secondary | ICD-10-CM | POA: Insufficient documentation

## 2018-12-25 DIAGNOSIS — Z853 Personal history of malignant neoplasm of breast: Secondary | ICD-10-CM | POA: Insufficient documentation

## 2018-12-25 DIAGNOSIS — I480 Paroxysmal atrial fibrillation: Secondary | ICD-10-CM | POA: Insufficient documentation

## 2018-12-25 DIAGNOSIS — Z7984 Long term (current) use of oral hypoglycemic drugs: Secondary | ICD-10-CM | POA: Insufficient documentation

## 2018-12-25 DIAGNOSIS — Z833 Family history of diabetes mellitus: Secondary | ICD-10-CM | POA: Insufficient documentation

## 2018-12-25 DIAGNOSIS — G4733 Obstructive sleep apnea (adult) (pediatric): Secondary | ICD-10-CM | POA: Diagnosis not present

## 2018-12-25 DIAGNOSIS — M62838 Other muscle spasm: Secondary | ICD-10-CM | POA: Insufficient documentation

## 2018-12-25 DIAGNOSIS — Z7901 Long term (current) use of anticoagulants: Secondary | ICD-10-CM | POA: Diagnosis not present

## 2018-12-25 DIAGNOSIS — Z87891 Personal history of nicotine dependence: Secondary | ICD-10-CM | POA: Insufficient documentation

## 2018-12-25 DIAGNOSIS — E559 Vitamin D deficiency, unspecified: Secondary | ICD-10-CM | POA: Diagnosis not present

## 2018-12-25 DIAGNOSIS — E669 Obesity, unspecified: Secondary | ICD-10-CM | POA: Insufficient documentation

## 2018-12-25 DIAGNOSIS — Z885 Allergy status to narcotic agent status: Secondary | ICD-10-CM | POA: Diagnosis not present

## 2018-12-25 DIAGNOSIS — J449 Chronic obstructive pulmonary disease, unspecified: Secondary | ICD-10-CM | POA: Diagnosis not present

## 2018-12-25 DIAGNOSIS — I071 Rheumatic tricuspid insufficiency: Secondary | ICD-10-CM | POA: Insufficient documentation

## 2018-12-25 DIAGNOSIS — E785 Hyperlipidemia, unspecified: Secondary | ICD-10-CM | POA: Insufficient documentation

## 2018-12-25 DIAGNOSIS — Z9010 Acquired absence of unspecified breast and nipple: Secondary | ICD-10-CM | POA: Diagnosis not present

## 2018-12-25 DIAGNOSIS — I110 Hypertensive heart disease with heart failure: Secondary | ICD-10-CM | POA: Insufficient documentation

## 2018-12-25 DIAGNOSIS — Z79811 Long term (current) use of aromatase inhibitors: Secondary | ICD-10-CM | POA: Diagnosis not present

## 2018-12-25 DIAGNOSIS — J849 Interstitial pulmonary disease, unspecified: Secondary | ICD-10-CM | POA: Diagnosis not present

## 2018-12-25 DIAGNOSIS — Z79899 Other long term (current) drug therapy: Secondary | ICD-10-CM | POA: Insufficient documentation

## 2018-12-25 DIAGNOSIS — Z6836 Body mass index (BMI) 36.0-36.9, adult: Secondary | ICD-10-CM | POA: Diagnosis not present

## 2018-12-25 DIAGNOSIS — I420 Dilated cardiomyopathy: Secondary | ICD-10-CM | POA: Insufficient documentation

## 2018-12-25 DIAGNOSIS — Z8249 Family history of ischemic heart disease and other diseases of the circulatory system: Secondary | ICD-10-CM | POA: Insufficient documentation

## 2018-12-25 DIAGNOSIS — I5022 Chronic systolic (congestive) heart failure: Secondary | ICD-10-CM | POA: Insufficient documentation

## 2018-12-25 DIAGNOSIS — Z9221 Personal history of antineoplastic chemotherapy: Secondary | ICD-10-CM | POA: Insufficient documentation

## 2018-12-25 DIAGNOSIS — Z800 Family history of malignant neoplasm of digestive organs: Secondary | ICD-10-CM | POA: Insufficient documentation

## 2018-12-25 DIAGNOSIS — Z888 Allergy status to other drugs, medicaments and biological substances status: Secondary | ICD-10-CM | POA: Insufficient documentation

## 2018-12-25 NOTE — Progress Notes (Signed)
Advanced Heart Failure Clinic Consult Note   Referring Physician: Dr. Radford Pax PCP: Isaac Bliss, Rayford Halsted, MD PCP-Cardiologist: [name], MD   HPI:  Cynthia Herrera is a 73 y.o. female with h/o chronic systolic HF (EF 09%), h/o breast cancer treated with chemo/radiation, COPD, obesity, persistent Afib, OSA on CPAP, and HTN referred by Dr. Lajoyce Lauber for HF evaluation.   Had chemo/radiation for breast cancer in 2002. Not sure if she got adriamycin.   Cath 2015  Left main:calcified but widely patent and bifurcates into an LAD and left circumflex NAT:FTDDUKGUR but widely patent throughout its course to the apex.  It gives rise to a large bifurcating diagonal which is widely patent.  The LAD then gives rise to a second moderate sized diagonal which is patent.   KYH:CWCBJSEGB but widely patent and traverses the AV groove.  It gives rise to a large OM which bifurcates into 2 daughter branches which are widely patent.  TDV:VOHYWV patent and gives rise to 2 acute RV marginal branches which are patent.  Distally it bifurcates into a PDA and PL branches which are patent.   LV-gram done in the RAO projection: Ejection fraction = 45%  Echo 08/2014 LVEF 50-55%  Referred to Afib clinic in 02/2018 when noted to be in Afib with decreased LVEF EF 25%. Admitted 04/13/18 for Tikosyn, but failed due to QT prolongation. Rechallenged on amiodarone (previously failed due to dizziness.)  Seen by Dr. Rayann Heman 04/24/2018. Continued to have intermittent Afib but was noted to be in NSR that day. Complained of fatigue and poor exercise tolerance, but otherwise was doing well.   She returns today for HF follow up. Now following with Dr Chase Caller. High res CT scan suggestive of ILD. Dr Rayann Heman and Dr Haroldine Laws discussed with Dr Chase Caller and decided to stop her amiodarone. She has now had Afib ablation 12/12/18. Echo today shows EF 45-50%. Overall doing okay today. Having trouble with sinus infection over the last 4  weeks. She is having a terrible headache and feels like she had a fever 2 days ago. Her PCP gave her Augmentin, which didn't help. +cough with yellow sputum. SOB is the same. No edema. Chronically sleeps on 2 pillows. She could not tolerate CPAP, but is going to see Dr Ron Parker for a mouth piece. No bleeding on xarelto. No CP. No palpitations or afib even after stopping amio. Taking all medications. Staying active taking care of her 10 month old [place]. Weights stable 222-223 lbs.   Echo 06/21/18: EF 35-40%, grade 1 DD, mild MR Echo 03/03/2018 LVEF 25%, mild LAE, mild MR, PA peak pressure 29 mm Hg.  Echo 08/2014 LVEF 50-55%  Myoview 04/10/18 LVEF 41%, No reversible ischemia.  Review of systems complete and found to be negative unless listed in HPI.   Past Medical History:  Diagnosis Date  . Arthritis   . Breast cancer, left breast ([place]) 2002   "then treated w/chemo and radiation"  . Breast cancer, left breast ([place]) 07/14/2017   "tx'd w/mastectomy"  . Chronic back pain    h/o lumbar stenosis; "no problem since my OR" (`10/27/2017)  . Chronic systolic CHF (congestive heart failure) ([facility] [place])   . Complication of anesthesia    last surgery iv med when going to sleep"burned as injected"  . COPD (chronic obstructive pulmonary disease) ([facility])    no inhalers   . DCM (dilated cardiomyopathy) ([place])    EF 15-20% ? tachycardia induced - EF 35-40% by echo 05/2018  . Dyspnea  with exertion  . Gallstones   . GERD (gastroesophageal reflux disease)    once in a while;depends on what she eats  . History of bronchitis    "chronic when I smoked; no problem since I quit in 2002" (10/27/2017)  . Hyperlipidemia   . Hypertension    takes Losartan and Metoprolol.   . Joint pain   . Muscle spasm    takes Flexeril daily as needed   . Nocturia   . OSA (obstructive sleep apnea) 08/22/2018   Severe obstructive sleep apnea with an AHI 47.3/h and no significant central sleep apnea with moderate oxygen desaturations as low  as 79%  . Osteopenia   . Persistent atrial fibrillation    s/p TEE DCCV and repeat DCCV 11/14/2013 and 02/2018  . Personal history of colonic polyps    adenomas 03 and 08  . Pneumonia    hx  . Seasonal allergies   . Thyroid nodule   . Type II diabetes mellitus ([place])   . Vitamin D deficiency    takes Vit D    Current Outpatient Medications  Medication Sig Dispense Refill  . amoxicillin (AMOXIL) 500 MG capsule Take 2,000 mg by mouth See admin instructions. Take 2000 mg by mouth 1 hour prior to dental appointment  5  . atorvastatin (LIPITOR) 40 MG tablet TAKE 1 TABLET AT BEDTIME 90 tablet 2  . Cholecalciferol (VITAMIN D3) 2000 units capsule Take 2,000 Units by mouth daily.    . clotrimazole-betamethasone (LOTRISONE) cream Apply 1 application topically 2 (two) times daily. (Patient taking differently: Apply 1 application topically 2 (two) times daily as needed (for feet). ) 30 g 0  . fexofenadine (ALLEGRA) 180 MG tablet Take 180 mg by mouth daily as needed for allergies or rhinitis.    . fluticasone (FLONASE) 50 MCG/ACT nasal spray Place 1 spray into both nostrils daily as needed for allergies.     . furosemide (LASIX) 20 MG tablet TAKE 1 TABLET BY MOUTH TWICE A DAY 180 tablet 2  . glucose blood test strip Use to check CBG AC and QHS. 200 each 12  . KLOR-CON M20 20 MEQ tablet Take 1 tablet (20 mEq total) by mouth daily. 90 tablet 3  . letrozole (FEMARA) 2.5 MG tablet TAKE 1 TABLET BY MOUTH EVERY DAY 90 tablet 3  . losartan (COZAAR) 25 MG tablet TAKE 1 TABLET BY MOUTH TWICE A DAY 180 tablet 3  . metFORMIN (GLUCOPHAGE) 500 MG tablet TAKE 1 TABLET TWICE A DAY WITH A MEAL (Patient taking differently: Take 500 mg by mouth 2 (two) times daily with a meal. ) 180 tablet 1  . nebivolol (BYSTOLIC) 2.5 MG tablet Take 1 tablet (2.5 mg total) by mouth daily. 90 tablet 3  . pantoprazole (PROTONIX) 40 MG tablet Take 1 tablet (40 mg total) by mouth daily. 45 tablet 0  . spironolactone (ALDACTONE) 25 MG  tablet TAKE 1 TABLET BY MOUTH EVERY DAY 90 tablet 2  . traMADol (ULTRAM) 50 MG tablet Take 1-2 tablets (50-100 mg total) by mouth every 6 (six) hours as needed. (Patient taking differently: Take 50-100 mg by mouth every 6 (six) hours as needed (pain). ) 20 tablet 0  . XARELTO 20 MG TABS tablet TAKE 1 TABLET EVERY DAY WITH SUPPER (Patient taking differently: Take 20 mg by mouth daily with supper. ) 30 tablet 6   No current facility-administered medications for this encounter.     Allergies  Allergen Reactions  . Tikosyn [Dofetilide]  Prolonged QT/QTc  . Zolpidem Other (See Comments)    Hallucinations, up walking around ? Hallucinations, up walking around  . Codeine Other (See Comments)  . Codeine Phosphate Nausea And Vomiting  . Methylprednisolone Other (See Comments)    Felt really weird w the high dose oral steroid, but tolerates low doses or oral steroid Felt really weird w the high dose oral steroid, but tolerates low doses or oral steroid      Social History   Socioeconomic History  . Marital status: Widowed    Spouse name: Not on file  . Number of children: 2  . Years of education: Not on file  . Highest education level: Not on file  Occupational History  . Occupation: retired  Scientific laboratory technician  . Financial resource strain: Not on file  . Food insecurity:    Worry: Not on file    Inability: Not on file  . Transportation needs:    Medical: Not on file    Non-medical: Not on file  Tobacco Use  . Smoking status: Former Smoker    Packs/day: 1.50    Years: 35.00    Pack years: 52.50    Types: Cigarettes    Last attempt to quit: 08/24/2001    Years since quitting: 17.3  . Smokeless tobacco: Never Used  Substance and Sexual Activity  . Alcohol use: No  . Drug use: No  . Sexual activity: Never    Birth control/protection: Post-menopausal  Lifestyle  . Physical activity:    Days per week: Not on file    Minutes per session: Not on file  . Stress: Not on file    Relationships  . Social connections:    Talks on phone: Not on file    Gets together: Not on file    Attends religious service: Not on file    Active member of club or organization: Not on file    Attends meetings of clubs or organizations: Not on file    Relationship status: Not on file  . Intimate partner violence:    Fear of current or ex partner: Not on file    Emotionally abused: Not on file    Physically abused: Not on file    Forced sexual activity: Not on file  Other Topics Concern  . Not on file  Social History Narrative  . Not on file      Family History  Problem Relation Age of Onset  . Dementia Sister   . Diabetes Sister   . Alzheimer's disease Sister   . Diabetes Brother        x 2  . Heart disease Brother   . Irritable bowel syndrome Brother   . Liver cancer Mother   . Diabetes Mother   . Pancreatic cancer Mother   . Diabetes Father   . Heart disease Father     Vitals:   12/25/18 1418  BP: 128/64  Pulse: 67  SpO2: 98%  Weight: 102.4 kg (225 lb 12.8 oz)    Wt Readings from Last 3 Encounters:  12/25/18 102.4 kg (225 lb 12.8 oz)  12/12/18 102.5 kg (226 lb)  11/17/18 103.4 kg (228 lb)    PHYSICAL EXAM: General: Obese. No resp difficulty. HEENT: Normal Neck: Supple. JVP 5-6. Carotids 2+ bilat; no bruits. No thyromegaly or nodule noted. Cor: PMI nondisplaced. RRR, No M/G/R noted Lungs: CTAB, normal effort. Abdomen: Soft, non-tender, non-distended, no HSM. No bruits or masses. +BS  Extremities: No cyanosis, clubbing, or rash. R  and LLE no edema.  Neuro: Alert & orientedx3, cranial nerves grossly intact. moves all 4 extremities w/o difficulty. Affect pleasant   ASSESSMENT & PLAN:  1. Chronic systolic CHF, NICM - Suspect tachy-mediated. EF has already improved with control of her Afib - Echo 03/03/2018 LVEF 25%, mild LAE, mild MR, PA peak pressure 29 mm Hg.  - Cath 2015 normal coronaries - EF improved to 41% on Myoview 03/2018. - Echo 05/2018:  EF 35-40%, grade 1 DD - Echo today EF 45-50% - NYHA II symptoms.  - Volume stable on exam.  - Continue lasix 20 mg BID - Continue losartan 25 mg BID. Will defer switching to [facility] for now because she is in the donut hole - Continue spironolactone 25 mg daily.  - Continue nebivolol 2.5 mg daily. (unable to tolerate 5mg  due to severe bradycardia) 2. Paroxysmal Afib - Now s/p Afib ablation 12/12/18 with Dr Rayann Heman - Off amio with ILD on high rest chest CT.  - She is on Xarelto 20 mg daily. No bleeding  3. HTN - Stable.  4. Obesity - Body mass index is 36.45 kg/m.  5. Severe OSA on CPAP - AHI 47 Has CPAP titration 06/23/18 - Following up with Dr Ron Parker for special mouth piece 6. COPD - Stopped smoking 08/24/2001 - Following with Dr Chase Caller 7. H/o breast cancer treated with chemo/radiation 2002 - With recurrence and mastectomy in January 2019 - Doesn't remember ever getting Adriamycin - Follows with Dr Lindi Adie 8. ILD - Now followed by Dr Chase Caller. 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    Georgiana Shore, NP  2:28 PM   Patient seen and examined with the above-signed Advanced Practice Provider and/or Housestaff. I personally reviewed laboratory data, imaging studies and relevant notes. I independently examined the patient and formulated the important aspects of the plan. I have edited the note to reflect any of my changes or salient points. I have personally discussed the plan with the patient and/or family.  Doing well s/p recent AF ablation with Dr. Rayann Heman. Maintaining NSR. Echo today reviewed personally EF 45-50%. Volume status looks good. Continue Xarelto. No bleeding. Still has not gotten dental appliance for AF. Stressed need to control OSA to reduce risk of AF recurrence.   Glori Bickers, MD  2:53 PM

## 2018-12-25 NOTE — Progress Notes (Signed)
  Echocardiogram 2D Echocardiogram has been performed.  Muaz Shorey G Storm Dulski 12/25/2018, 1:40 PM

## 2018-12-25 NOTE — Patient Instructions (Signed)
Please call our office in July to schedule your next appointment for September.

## 2018-12-25 NOTE — Addendum Note (Signed)
Encounter addended by: Scarlette Calico, RN on: 12/25/2018 2:58 PM  Actions taken: Clinical Note Signed

## 2019-01-02 ENCOUNTER — Encounter: Payer: Self-pay | Admitting: Internal Medicine

## 2019-01-02 ENCOUNTER — Ambulatory Visit (INDEPENDENT_AMBULATORY_CARE_PROVIDER_SITE_OTHER): Payer: Medicare Other | Admitting: Internal Medicine

## 2019-01-02 VITALS — BP 110/70 | HR 66 | Temp 98.2°F | Ht 66.0 in | Wt 227.3 lb

## 2019-01-02 DIAGNOSIS — I5042 Chronic combined systolic (congestive) and diastolic (congestive) heart failure: Secondary | ICD-10-CM | POA: Diagnosis not present

## 2019-01-02 DIAGNOSIS — I70209 Unspecified atherosclerosis of native arteries of extremities, unspecified extremity: Secondary | ICD-10-CM | POA: Diagnosis not present

## 2019-01-02 DIAGNOSIS — I10 Essential (primary) hypertension: Secondary | ICD-10-CM | POA: Diagnosis not present

## 2019-01-02 DIAGNOSIS — E1151 Type 2 diabetes mellitus with diabetic peripheral angiopathy without gangrene: Secondary | ICD-10-CM | POA: Diagnosis not present

## 2019-01-02 DIAGNOSIS — G8929 Other chronic pain: Secondary | ICD-10-CM

## 2019-01-02 DIAGNOSIS — E041 Nontoxic single thyroid nodule: Secondary | ICD-10-CM

## 2019-01-02 DIAGNOSIS — G4733 Obstructive sleep apnea (adult) (pediatric): Secondary | ICD-10-CM

## 2019-01-02 DIAGNOSIS — Z853 Personal history of malignant neoplasm of breast: Secondary | ICD-10-CM

## 2019-01-02 DIAGNOSIS — M549 Dorsalgia, unspecified: Secondary | ICD-10-CM

## 2019-01-02 DIAGNOSIS — M8580 Other specified disorders of bone density and structure, unspecified site: Secondary | ICD-10-CM

## 2019-01-02 DIAGNOSIS — J449 Chronic obstructive pulmonary disease, unspecified: Secondary | ICD-10-CM

## 2019-01-02 DIAGNOSIS — I4819 Other persistent atrial fibrillation: Secondary | ICD-10-CM

## 2019-01-02 DIAGNOSIS — E785 Hyperlipidemia, unspecified: Secondary | ICD-10-CM

## 2019-01-02 LAB — POCT GLYCOSYLATED HEMOGLOBIN (HGB A1C): Hemoglobin A1C: 5.4 % (ref 4.0–5.6)

## 2019-01-02 MED ORDER — METFORMIN HCL 500 MG PO TABS: 500.0000 mg | ORAL_TABLET | Freq: Every day | ORAL | 1 refills | Status: DC

## 2019-01-02 NOTE — Progress Notes (Signed)
Established Patient Office Visit     CC/Reason for Visit: transfer of care, follow up chronic medical conditions  HPI: Cynthia Herrera is a 73 y.o. female who is coming in today for the above mentioned reasons. Past Medical History is significant for hx of breast ca s/p left mastectomy, arthritis, chronic combined CHF, COPD, hypertension, hyperlipidemia, OSA, osteopenia, thyroid nodule and DM II.  Patient has a hx of breast cancer and sees Dr. Lindi Adie for follow up.  Patient sees Dr. Pierre Bali for CHF and Stephens Shire with cardiology.  She has had no changes in her medications.  Patient has been taking metformin and wants to cut it in half b/c her A1C have been in the lower 5s.  Patient sees Dr. Donnie Mesa with pulmonolgy and has seen him twice.  Dr. Rayann Heman did a heart ablation two weeks ago, and started her on protonix for 6 weeks.  She had an echo about a week ago and EF was 45-50%.  She had a thyroid biopsy in March of last year, but was benign.  Denies SOB, but says she does cough at night and sometimes it is hard to get the phlegm up.  She will take Mucinex as needed.     Past Medical/Surgical History: Past Medical History:  Diagnosis Date  . Arthritis   . Breast cancer, left breast ([place]) 2002   "then treated w/chemo and radiation"  . Breast cancer, left breast ([place]) 07/14/2017   "tx'd w/mastectomy"  . Chronic back pain    h/o lumbar stenosis; "no problem since my OR" (`10/27/2017)  . Chronic systolic CHF (congestive heart failure) ([place])   . Complication of anesthesia    last surgery iv med when going to sleep"burned as injected"  . COPD (chronic obstructive pulmonary disease) (HCC)    no inhalers   . DCM (dilated cardiomyopathy) ([place])    EF 15-20% ? tachycardia induced - EF 35-40% by echo 05/2018  . Dyspnea    with exertion  . Gallstones   . GERD (gastroesophageal reflux disease)    once in a while;depends on what she eats  . History of bronchitis    "chronic when I  smoked; no problem since I quit in 2002" (10/27/2017)  . Hyperlipidemia   . Hypertension    takes Losartan and Metoprolol.   . Joint pain   . Muscle spasm    takes Flexeril daily as needed   . Nocturia   . OSA (obstructive sleep apnea) 08/22/2018   Severe obstructive sleep apnea with an AHI 47.3/h and no significant central sleep apnea with moderate oxygen desaturations as low as 79%  . Osteopenia   . Persistent atrial fibrillation    s/p TEE DCCV and repeat DCCV 11/14/2013 and 02/2018  . Personal history of colonic polyps    adenomas 03 and 08  . Pneumonia    hx  . Seasonal allergies   . Thyroid nodule   . Type II diabetes mellitus ([place])   . Vitamin D deficiency    takes Vit D    Past Surgical History:  Procedure Laterality Date  . ATRIAL FIBRILLATION ABLATION N/A 12/12/2018   Procedure: ATRIAL FIBRILLATION ABLATION;  Surgeon: Thompson Grayer, MD;  Location: [place] [facility];  Service: Cardiovascular;  Laterality: N/A;  . AXILLARY LYMPH NODE DISSECTION Left 10/2001   persistent intramammary node Archie Endo 03/09/2011  . BACK SURGERY    . BREAST BIOPSY Left 2002  . BREAST BIOPSY Right 06/2017   "node  bx was negative"  . BREAST BIOPSY Left 06/2017   "positive for cancer"  . BREAST LUMPECTOMY WITH RADIOACTIVE SEED LOCALIZATION Right 08/15/2017   Procedure: RIGHT BREAST LUMPECTOMY WITH RADIOACTIVE SEED LOCALIZATION;  Surgeon: Jovita Kussmaul, MD;  Location: [place];  Service: General;  Laterality: Right;  . CARDIAC CATHETERIZATION  2015  . CARDIOVERSION N/A 11/12/2013   Procedure: CARDIOVERSION;  Surgeon: Thayer Headings, MD;  Location: [facility];  Service: Cardiovascular;  Laterality: N/A;  10:08  Dr. Marissa Nestle, anesthesia present, [place]   60mg ,  propofol 50mg , IV for elective cardioversion....Dr. Cathie Olden delievered synch 120 joules with successful cardioversion to NSR  . CARDIOVERSION N/A 11/12/2013   Procedure: CARDIOVERSION;  Surgeon: Thayer Headings, MD;  Location: [place];   Service: Cardiovascular;  Laterality: N/A;  . CARDIOVERSION N/A 11/14/2013   Procedure: CARDIOVERSION (BEDSIDE);  Surgeon: Larey Dresser, MD;  Location: [place];  Service: Cardiovascular;  Laterality: N/A;  . CARDIOVERSION N/A 02/22/2018   Procedure: CARDIOVERSION;  Surgeon: Sueanne Margarita, MD;  Location: [facility] ENDOSCOPY;  Service: Cardiovascular;  Laterality: N/A;  . CATARACT EXTRACTION W/ INTRAOCULAR LENS  IMPLANT, BILATERAL Bilateral ~ 2016  . COLONOSCOPY  2003, September 2008, April 01, 2011   adenomas 03 and 08, polyp 12, diverticulosis  . COLONOSCOPY WITH PROPOFOL N/A 11/16/2016   Procedure: COLONOSCOPY WITH PROPOFOL;  Surgeon: Gatha Mayer, MD;  Location: [facility] ENDOSCOPY;  Service: Endoscopy;  Laterality: N/A;  . JOINT REPLACEMENT    . LAPAROSCOPIC CHOLECYSTECTOMY  2004  . LEFT AND RIGHT HEART CATHETERIZATION WITH CORONARY ANGIOGRAM N/A 02/01/2014   Procedure: LEFT AND RIGHT HEART CATHETERIZATION WITH CORONARY ANGIOGRAM;  Surgeon: Sueanne Margarita, MD;  Location: [place] CATH LAB;  Service: Cardiovascular;  Laterality: N/A;  . LUMBAR LAMINECTOMY/DECOMPRESSION MICRODISCECTOMY N/A 03/19/2014   Procedure: LUMBAR LAMINECTOMY/DECOMPRESSION MICRODISCECTOMY 4 LEVEL;  Surgeon: Kristeen Miss, MD;  Location: [place] NEURO ORS;  Service: Neurosurgery;  Laterality: N/A;  L1-2 L2-3 L3-4 L4-5 Laminectomy/Foraminotomy  . MASS EXCISION Left 08/15/2017   Procedure: EXCISION OF LEFT BREAST MASS;  Surgeon: Jovita Kussmaul, MD;  Location: [place];  Service: General;  Laterality: Left;  Marland Kitchen MASTECTOMY Left 10/27/2017  . MASTECTOMY PARTIAL / LUMPECTOMY W/ AXILLARY LYMPHADENECTOMY  05/08/2001   Archie Endo 03/09/2011  . PORT-A-CATH REMOVAL  2003  . PORTA CATH INSERTION  2002  . TEE WITHOUT CARDIOVERSION N/A 11/12/2013   Procedure: TRANSESOPHAGEAL ECHOCARDIOGRAM (TEE);  Surgeon: Thayer Headings, MD;  Location: [place];  Service: Cardiovascular;  Laterality: N/A;  pt b/p low, pt buccal membranes very dry, lips scapped, pt c/o thirst. NPO  since MN and iv FLUIDS TOTAL INFUSING AT TOTAL 20ML HR....  Dr. Cathie Olden order allow NS to bolus during procedure....very dry,NS bolus 250 ml total..pt responding well to meds..  . TOTAL KNEE ARTHROPLASTY Left 2006  . TOTAL KNEE ARTHROPLASTY Right 05/10/2016   Procedure: RIGHT TOTAL KNEE ARTHROPLASTY;  Surgeon: Gaynelle Arabian, MD;  Location: [facility] OR[facility];  Service: Orthopedics;  Laterality: Right;  With adductor block  . TOTAL MASTECTOMY Left 10/27/2017   Procedure: LEFT MASTECTOMY;  Surgeon: Jovita Kussmaul, MD;  Location: [place];  Service: General;  Laterality: Left;  . TUBAL LIGATION      Social History:  reports that she quit smoking about 17 years ago. Her smoking use included cigarettes. She has a 52.50 pack-year smoking history. She has never used smokeless tobacco. She reports that she does not drink alcohol or use drugs.  Allergies: Allergies  Allergen Reactions  . Tikosyn [  Dofetilide]     Prolonged QT/QTc  . Zolpidem Other (See Comments)    Hallucinations, up walking around ? Hallucinations, up walking around  . Codeine Other (See Comments)  . Codeine Phosphate Nausea And Vomiting  . Methylprednisolone Other (See Comments)    Felt really weird w the high dose oral steroid, but tolerates low doses or oral steroid Felt really weird w the high dose oral steroid, but tolerates low doses or oral steroid    Family History:  Family History  Problem Relation Age of Onset  . Dementia Sister   . Diabetes Sister   . Alzheimer's disease Sister   . Diabetes Brother        x 2  . Heart disease Brother   . Irritable bowel syndrome Brother   . Liver cancer Mother   . Diabetes Mother   . Pancreatic cancer Mother   . Diabetes Father   . Heart disease Father      Current Outpatient Medications:  .  amoxicillin (AMOXIL) 500 MG capsule, Take 2,000 mg by mouth See admin instructions. Take 2000 mg by mouth 1 hour prior to dental appointment, Disp: , Rfl: 5 .  atorvastatin (LIPITOR) 40 MG tablet,  TAKE 1 TABLET AT BEDTIME, Disp: 90 tablet, Rfl: 2 .  Cholecalciferol (VITAMIN D3) 2000 units capsule, Take 2,000 Units by mouth daily., Disp: , Rfl:  .  clotrimazole-betamethasone (LOTRISONE) cream, Apply 1 application topically 2 (two) times daily. (Patient taking differently: Apply 1 application topically 2 (two) times daily as needed (for feet). ), Disp: 30 g, Rfl: 0 .  fexofenadine (ALLEGRA) 180 MG tablet, Take 180 mg by mouth daily as needed for allergies or rhinitis., Disp: , Rfl:  .  fluticasone (FLONASE) 50 MCG/ACT nasal spray, Place 1 spray into both nostrils daily as needed for allergies. , Disp: , Rfl:  .  furosemide (LASIX) 20 MG tablet, TAKE 1 TABLET BY MOUTH TWICE A DAY, Disp: 180 tablet, Rfl: 2 .  glucose blood test strip, Use to check CBG AC and QHS., Disp: 200 each, Rfl: 12 .  KLOR-CON M20 20 MEQ tablet, Take 1 tablet (20 mEq total) by mouth daily., Disp: 90 tablet, Rfl: 3 .  letrozole (FEMARA) 2.5 MG tablet, TAKE 1 TABLET BY MOUTH EVERY DAY, Disp: 90 tablet, Rfl: 3 .  losartan (COZAAR) 25 MG tablet, TAKE 1 TABLET BY MOUTH TWICE A DAY, Disp: 180 tablet, Rfl: 3 .  metFORMIN (GLUCOPHAGE) 500 MG tablet, Take 1 tablet (500 mg total) by mouth daily with breakfast., Disp: 180 tablet, Rfl: 1 .  nebivolol (BYSTOLIC) 2.5 MG tablet, Take 1 tablet (2.5 mg total) by mouth daily., Disp: 90 tablet, Rfl: 3 .  pantoprazole (PROTONIX) 40 MG tablet, Take 1 tablet (40 mg total) by mouth daily., Disp: 45 tablet, Rfl: 0 .  spironolactone (ALDACTONE) 25 MG tablet, TAKE 1 TABLET BY MOUTH EVERY DAY, Disp: 90 tablet, Rfl: 2 .  traMADol (ULTRAM) 50 MG tablet, Take 1-2 tablets (50-100 mg total) by mouth every 6 (six) hours as needed. (Patient taking differently: Take 50-100 mg by mouth every 6 (six) hours as needed (pain). ), Disp: 20 tablet, Rfl: 0 .  XARELTO 20 MG TABS tablet, TAKE 1 TABLET EVERY DAY WITH SUPPER (Patient taking differently: Take 20 mg by mouth daily with supper. ), Disp: 30 tablet, Rfl:  6  Review of Systems:  Constitutional: Denies fever, chills, diaphoresis, appetite change and fatigue.  HEENT: Denies photophobia, eye pain, redness, hearing loss, ear pain, congestion,  sore throat, rhinorrhea, sneezing, mouth sores, trouble swallowing, neck pain, neck stiffness and tinnitus.   Respiratory: Denies SOB, DOE, cough, chest tightness,  and wheezing.   Cardiovascular: Denies chest pain, palpitations and leg swelling.  Gastrointestinal: Denies nausea, vomiting, abdominal pain, diarrhea, constipation, blood in stool and abdominal distention.  Genitourinary: Denies dysuria, urgency, frequency, hematuria, flank pain and difficulty urinating.  Endocrine: Denies: hot or cold intolerance, sweats, changes in hair or nails, polyuria, polydipsia. Musculoskeletal: Denies myalgias, back pain, joint swelling, arthralgias and gait problem.  Skin: Denies pallor, rash and wound.  Neurological: Denies dizziness, seizures, syncope, weakness, light-headedness, numbness and headaches.  Hematological: Denies adenopathy. Easy bruising, personal or family bleeding history  Psychiatric/Behavioral: Denies suicidal ideation, mood changes, confusion, nervousness, sleep disturbance and agitation   Physical Exam: Vitals:   01/02/19 1421  BP: 110/70  Pulse: 66  Temp: 98.2 F (36.8 C)  TempSrc: Oral  SpO2: 95%  Weight: 227 lb 4.8 oz (103.1 kg)  Height: 5\' 6"  (1.676 m)    Body mass index is 36.69 kg/m.   Constitutional: NAD, calm, comfortable Eyes: PERRL, lids and conjunctivae normal Respiratory: clear to auscultation bilaterally, no wheezing, no crackles. Normal respiratory effort. No accessory muscle use.  Cardiovascular: Regular rate and rhythm, no murmurs / rubs / gallops. No extremity edema. 2+ pedal pulses. No carotid bruits.   Abd: S/NT/ND/+BS Psychiatric: Normal judgment and insight. Alert and oriented x 3. Normal mood.    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 -Per patient preference will decrease metformin to 500 mg once daily.  Essential hypertension -well controlled, cont current meds   Persistent atrial fibrillation - NSR today -Anticoagulated on xarelto  Chronic combined systolic and diastolic CHF (congestive heart failure) ([place]) -ECHO 3/20: EF 45-50%, impaired relaxation, no mention of DD -On BB, ARB, statin, spironolactone.  Chronic obstructive pulmonary disease, unspecified COPD type ([place]) -follow up with pulmonology   Thyroid nodule - biospy last March (benign follicular)  Dyslipidemia -Last LDL 56 in 5/19.  BREAST CANCER, HX OF -follows up with oncology -S/p left mastectomy    Patient Instructions  Great meeting you today.    Instructions: -Cut your metformin in half to 500mg  once a day and we will cont to monitor your A1C -follow up in 3 months      Enzo Bi, RN DNP Student [facility]

## 2019-01-02 NOTE — Patient Instructions (Addendum)
Great meeting you today.    Instructions: -Cut your metformin in half to 500mg  once a day and we will cont to monitor your A1C -follow up in 3 months

## 2019-01-09 ENCOUNTER — Other Ambulatory Visit: Payer: Self-pay | Admitting: Otolaryngology

## 2019-01-09 ENCOUNTER — Ambulatory Visit (HOSPITAL_COMMUNITY): Payer: Medicare Other | Admitting: Nurse Practitioner

## 2019-01-09 DIAGNOSIS — E041 Nontoxic single thyroid nodule: Secondary | ICD-10-CM

## 2019-01-10 ENCOUNTER — Other Ambulatory Visit: Payer: Self-pay | Admitting: Cardiology

## 2019-01-11 ENCOUNTER — Ambulatory Visit: Payer: Medicare Other | Admitting: Internal Medicine

## 2019-01-16 ENCOUNTER — Other Ambulatory Visit: Payer: Medicare Other

## 2019-01-19 ENCOUNTER — Other Ambulatory Visit: Payer: Self-pay | Admitting: Internal Medicine

## 2019-01-23 ENCOUNTER — Ambulatory Visit: Payer: Medicare Other | Admitting: Cardiology

## 2019-01-29 ENCOUNTER — Other Ambulatory Visit: Payer: Medicare Other

## 2019-01-30 ENCOUNTER — Ambulatory Visit (HOSPITAL_COMMUNITY): Payer: Medicare Other | Admitting: Nurse Practitioner

## 2019-02-19 ENCOUNTER — Ambulatory Visit: Payer: Medicare Other | Admitting: Internal Medicine

## 2019-02-21 ENCOUNTER — Ambulatory Visit: Payer: Medicare Other | Admitting: Internal Medicine

## 2019-03-08 ENCOUNTER — Telehealth: Payer: Self-pay

## 2019-03-08 NOTE — Telephone Encounter (Signed)
Spoke with pt regarding appt on 03/12/19. Pt stated she does not have a smart device. Pt was advise to keep phone visit and check vitals prior to appt. Pt concerns were address.

## 2019-03-12 ENCOUNTER — Other Ambulatory Visit: Payer: Self-pay

## 2019-03-12 ENCOUNTER — Telehealth (INDEPENDENT_AMBULATORY_CARE_PROVIDER_SITE_OTHER): Payer: Medicare Other | Admitting: Internal Medicine

## 2019-03-12 ENCOUNTER — Encounter: Payer: Self-pay | Admitting: Internal Medicine

## 2019-03-12 DIAGNOSIS — G4733 Obstructive sleep apnea (adult) (pediatric): Secondary | ICD-10-CM | POA: Diagnosis not present

## 2019-03-12 DIAGNOSIS — I4819 Other persistent atrial fibrillation: Secondary | ICD-10-CM | POA: Diagnosis not present

## 2019-03-12 DIAGNOSIS — I420 Dilated cardiomyopathy: Secondary | ICD-10-CM

## 2019-03-12 NOTE — Progress Notes (Signed)
Electrophysiology TeleHealth Note   Due to national recommendations of social distancing due to [place] 19, an audio telehealth visit is felt to be most appropriate for this patient at this time.  Verbal consent obtained from the patient today.  She does not have a smart phone or internet at this time and cannot perform a virtual visit.   Date:  03/12/2019   ID:  [name], DOB 1946/08/19, MRN [id number]  Location: patient's home  Provider location: [address] [place]  Evaluation Performed: Follow-up visit  PCP:  Isaac Bliss, Rayford Halsted, MD  Cardiologist:  [name], MD  Electrophysiologist:  Dr Rayann Heman  Chief Complaint:  afib  History of Present Illness:    Cynthia Herrera is a 73 y.o. female who presents via audio conferencing for a telehealth visit today.  Since her ablation, the patient reports doing very well.  She is pleased that she remains in sinus rhythm.  She has been able to do yard work this summer (first time in several years)!  She denies procedure related complications.  Today, she denies symptoms of palpitations, chest pain, shortness of breath,  lower extremity edema, dizziness, presyncope, or syncope.  The patient is otherwise without complaint today.  The patient denies symptoms of fevers, chills, cough, or new SOB worrisome for COVID 19.  Past Medical History:  Diagnosis Date  . Arthritis   . Breast cancer, left breast ([place]) 2002   "then treated w/chemo and radiation"  . Breast cancer, left breast ([place]) 07/14/2017   "tx'd w/mastectomy"  . Chronic back pain    h/o lumbar stenosis; "no problem since my OR" (`10/27/2017)  . Chronic systolic CHF (congestive heart failure) ([place])   . Complication of anesthesia    last surgery iv med when going to sleep"burned as injected"  . COPD (chronic obstructive pulmonary disease) ([facility])    no inhalers   . DCM (dilated cardiomyopathy) ([place])    EF 15-20% ? tachycardia induced - EF 35-40% by echo  05/2018  . Dyspnea    with exertion  . Gallstones   . GERD (gastroesophageal reflux disease)    once in a while;depends on what she eats  . History of bronchitis    "chronic when I smoked; no problem since I quit in 2002" (10/27/2017)  . Hyperlipidemia   . Hypertension    takes Losartan and Metoprolol.   . Joint pain   . Muscle spasm    takes Flexeril daily as needed   . Nocturia   . OSA (obstructive sleep apnea) 08/22/2018   Severe obstructive sleep apnea with an AHI 47.3/h and no significant central sleep apnea with moderate oxygen desaturations as low as 79%  . Osteopenia   . Persistent atrial fibrillation    s/p TEE DCCV and repeat DCCV 11/14/2013 and 02/2018  . Personal history of colonic polyps    adenomas 03 and 08  . Pneumonia    hx  . Seasonal allergies   . Thyroid nodule   . Type II diabetes mellitus ([place])   . Vitamin D deficiency    takes Vit D    Past Surgical History:  Procedure Laterality Date  . ATRIAL FIBRILLATION ABLATION N/A 12/12/2018   Procedure: ATRIAL FIBRILLATION ABLATION;  Surgeon: Thompson Grayer, MD;  Location: [facility];  Service: Cardiovascular;  Laterality: N/A;  . AXILLARY LYMPH NODE DISSECTION Left 10/2001   persistent intramammary node Archie Endo 03/09/2011  . BACK SURGERY    .  BREAST BIOPSY Left 2002  . BREAST BIOPSY Right 06/2017   "node bx was negative"  . BREAST BIOPSY Left 06/2017   "positive for cancer"  . BREAST LUMPECTOMY WITH RADIOACTIVE SEED LOCALIZATION Right 08/15/2017   Procedure: RIGHT BREAST LUMPECTOMY WITH RADIOACTIVE SEED LOCALIZATION;  Surgeon: Jovita Kussmaul, MD;  Location: [facility];  Service: General;  Laterality: Right;  . CARDIAC CATHETERIZATION  2015  . CARDIOVERSION N/A 11/12/2013   Procedure: CARDIOVERSION;  Surgeon: Thayer Headings, MD;  Location[facility];  Service: Cardiovascular;  Laterality: N/A;  10:08  Dr. Marissa Nestle, anesthesia present, [place]   60mg ,  propofol 50mg , IV for elective cardioversion....Dr. Cathie Olden  delievered synch 120 joules with successful cardioversion to NSR  . CARDIOVERSION N/A 11/12/2013   Procedure: CARDIOVERSION;  Surgeon: Thayer Headings, MD;  Location: [facility];  Service: Cardiovascular;  Laterality: N/A;  . CARDIOVERSION N/A 11/14/2013   Procedure: CARDIOVERSION (BEDSIDE);  Surgeon: Larey Dresser, MD;  Location: [facility];  Service: Cardiovascular;  Laterality: N/A;  . CARDIOVERSION N/A 02/22/2018   Procedure: CARDIOVERSION;  Surgeon: Sueanne Margarita, MD;  Location: [facility]OSCOPY;  Service: Cardiovascular;  Laterality: N/A;  . CATARACT EXTRACTION W/ INTRAOCULAR LENS  IMPLANT, BILATERAL Bilateral ~ 2016  . COLONOSCOPY  2003, September 2008, April 01, 2011   adenomas 03 and 08, polyp 12, diverticulosis  . COLONOSCOPY WITH PROPOFOL N/A 11/16/2016   Procedure: COLONOSCOPY WITH PROPOFOL;  Surgeon: Gatha Mayer, MD;  Location: [facility] ENDOSCOPY;  Service: Endoscopy;  Laterality: N/A;  . JOINT REPLACEMENT    . LAPAROSCOPIC CHOLECYSTECTOMY  2004  . LEFT AND RIGHT HEART CATHETERIZATION WITH CORONARY ANGIOGRAM N/A 02/01/2014   Procedure: LEFT AND RIGHT HEART CATHETERIZATION WITH CORONARY ANGIOGRAM;  Surgeon: Sueanne Margarita, MD;  Location: [facility] CATH LAB;  Service: Cardiovascular;  Laterality: N/A;  . LUMBAR LAMINECTOMY/DECOMPRESSION MICRODISCECTOMY N/A 03/19/2014   Procedure: LUMBAR LAMINECTOMY/DECOMPRESSION MICRODISCECTOMY 4 LEVEL;  Surgeon: Kristeen Miss, MD;  Location: [facility]URO ORS;  Service: Neurosurgery;  Laterality: N/A;  L1-2 L2-3 L3-4 L4-5 Laminectomy/Foraminotomy  . MASS EXCISION Left 08/15/2017   Procedure: EXCISION OF LEFT BREAST MASS;  Surgeon: Jovita Kussmaul, MD;  Location: [facility];  Service: General;  Laterality: Left;  Marland Kitchen MASTECTOMY Left 10/27/2017  . MASTECTOMY PARTIAL / LUMPECTOMY W/ AXILLARY LYMPHADENECTOMY  05/08/2001   Archie Endo 03/09/2011  . PORT-A-CATH REMOVAL  2003  . PORTA CATH INSERTION  2002  . TEE WITHOUT CARDIOVERSION N/A 11/12/2013   Procedure: TRANSESOPHAGEAL ECHOCARDIOGRAM  (TEE);  Surgeon: Thayer Headings, MD;  Location: [facility];  Service: Cardiovascular;  Laterality: N/A;  pt b/p low, pt buccal membranes very dry, lips scapped, pt c/o thirst. NPO since MN and iv FLUIDS TOTAL INFUSING AT TOTAL 20ML HR....  Dr. Cathie Olden order allow NS to bolus during procedure....very dry,NS bolus 250 ml total..pt responding well to meds..  . TOTAL KNEE ARTHROPLASTY Left 2006  . TOTAL KNEE ARTHROPLASTY Right 05/10/2016   Procedure: RIGHT TOTAL KNEE ARTHROPLASTY;  Surgeon: Gaynelle Arabian, MD;  Location: [facility];  Service: Orthopedics;  Laterality: Right;  With adductor block  . TOTAL MASTECTOMY Left 10/27/2017   Procedure: LEFT MASTECTOMY;  Surgeon: Jovita Kussmaul, MD;  Location: [facility];  Service: General;  Laterality: Left;  . TUBAL LIGATION      Current Outpatient Medications  Medication Sig Dispense Refill  . amoxicillin (AMOXIL) 500 MG capsule Take 2,000 mg by mouth See admin instructions. Take 2000 mg by mouth 1 hour prior to dental appointment  5  .  atorvastatin (LIPITOR) 40 MG tablet TAKE 1 TABLET AT BEDTIME 90 tablet 2  . Cholecalciferol (VITAMIN D3) 2000 units capsule Take 2,000 Units by mouth daily.    . clotrimazole-betamethasone (LOTRISONE) cream Apply 1 application topically 2 (two) times daily. (Patient taking differently: Apply 1 application topically 2 (two) times daily as needed (for feet). ) 30 g 0  . fexofenadine (ALLEGRA) 180 MG tablet Take 180 mg by mouth daily as needed for allergies or rhinitis.    . fluticasone (FLONASE) 50 MCG/ACT nasal spray Place 1 spray into both nostrils daily as needed for allergies.     . furosemide (LASIX) 20 MG tablet TAKE 1 TABLET BY MOUTH TWICE A DAY 180 tablet 2  . glucose blood test strip Use to check CBG AC and QHS. 200 each 12  . KLOR-CON M20 20 MEQ tablet Take 1 tablet (20 mEq total) by mouth daily. 90 tablet 3  . letrozole (FEMARA) 2.5 MG tablet TAKE 1 TABLET BY MOUTH EVERY DAY 90 tablet 3  . losartan (COZAAR) 25 MG tablet TAKE  1 TABLET BY MOUTH TWICE A DAY 180 tablet 2  . metFORMIN (GLUCOPHAGE) 500 MG tablet Take 1 tablet (500 mg total) by mouth daily with breakfast. 180 tablet 1  . nebivolol (BYSTOLIC) 2.5 MG tablet Take 1 tablet (2.5 mg total) by mouth daily. 90 tablet 3  . pantoprazole (PROTONIX) 40 MG tablet TAKE 1 TABLET BY MOUTH EVERY DAY 90 tablet 3  . rivaroxaban (XARELTO) 20 MG TABS tablet Take 20 mg by mouth daily with supper.    Marland Kitchen spironolactone (ALDACTONE) 25 MG tablet TAKE 1 TABLET BY MOUTH EVERY DAY 90 tablet 2  . traMADol (ULTRAM) 50 MG tablet Take 50 mg by mouth every 6 (six) hours as needed for moderate pain.     No current facility-administered medications for this visit.     Allergies:   Tikosyn [dofetilide]; Zolpidem; Codeine; Codeine phosphate; and Methylprednisolone   Social History:  The patient  reports that she quit smoking about 17 years ago. Her smoking use included cigarettes. She has a 52.50 pack-year smoking history. She has never used smokeless tobacco. She reports that she does not drink alcohol or use drugs.   Family History:  The patient's  family history includes Alzheimer's disease in her sister; Dementia in her sister; Diabetes in her brother, father, mother, and sister; Heart disease in her brother and father; Irritable bowel syndrome in her brother; Liver cancer in her mother; Pancreatic cancer in her mother.   ROS:  Please see the history of present illness.   All other systems are personally reviewed and negative.    Exam:    Vital Signs:  Ht 5\' 6"  (1.676 m)   Wt 222 lb (100.7 kg)   BMI 35.83 kg/m   Well sounding   Labs/Other Tests and Data Reviewed:    Recent Labs: 03/26/2018: Magnesium 2.1 08/01/2018: ALT 28; TSH 0.488 11/17/2018: BUN 23; Creatinine, Ser 1.17; Hemoglobin 11.8; Platelets 386; Potassium 4.6; Sodium 143   Wt Readings from Last 3 Encounters:  03/12/19 222 lb (100.7 kg)  01/02/19 227 lb 4.8 oz (103.1 kg)  12/25/18 225 lb 12.8 oz (102.4 kg)      Other studies personally reviewed: Additional studies/ records that were reviewed today include: ablation report, my prior notes, CHF clinic notes Review of the above records today demonstrates: as above Prior radiographs: cardiac CT 12/06/2018 reveals calcium score 98%   ASSESSMENT & PLAN:    1.  Persistent atrial  fibrillation Doing great post ablation off AAD therapy Continue xarelto No changes today  2. OSA Uses dental orthotic fitted by Dr Ron Parker Doing well with this  3. Chronic systolic dysfunction/ nonischeimc CM CHF symptoms are much improved post ablation Consider repeat echo in 3 months  4. HTN Stable No change required today  5. Obesity Lifestyle modification encouraged  6. COVID 19 screen The patient denies symptoms of COVID 19 at this time.  The importance of social distancing was discussed today.  Follow-up:  3 months with me  Current medicines are reviewed at length with the patient today.   The patient does not have concerns regarding her medicines.  The following changes were made today:  none  Labs/ tests ordered today include:  No orders of the defined types were placed in this encounter.   Patient Risk:  after full review of this patients clinical status, I feel that they are at moderate risk at this time.  Today, I have spent 22 minutes with the patient with telehealth technology discussing afib .    Army Fossa, MD  03/12/2019 10:42 AM     [facility] [address] [place] [address] [phone] (office) [phone] (fax)

## 2019-03-13 ENCOUNTER — Other Ambulatory Visit: Payer: Medicare Other

## 2019-03-27 ENCOUNTER — Ambulatory Visit
Admission: RE | Admit: 2019-03-27 | Discharge: 2019-03-27 | Disposition: A | Discharge status: Home / Self Care | Payer: Medicare Other | Source: Ambulatory Visit | Attending: Otolaryngology | Admitting: Otolaryngology

## 2019-03-27 DIAGNOSIS — E041 Nontoxic single thyroid nodule: Secondary | ICD-10-CM

## 2019-03-29 ENCOUNTER — Telehealth: Payer: Self-pay | Admitting: Internal Medicine

## 2019-03-29 MED ORDER — NEBIVOLOL HCL 2.5 MG PO TABS: 2.5000 mg | ORAL_TABLET | Freq: Every day | ORAL | 1 refills | Status: DC

## 2019-03-29 NOTE — Telephone Encounter (Signed)
Copied from [place] [id number]. Topic: Quick Communication - Rx Refill/Question >> Mar 29, 2019  9:32 AM Rainey Pines A wrote: Medication: nebivolol (BYSTOLIC) 2.5 MG tablet  Has the patient contacted their pharmacy? Yes (Agent: If no, request that the patient contact the pharmacy for the refill.) (Agent: If yes, when and what did the pharmacy advise?)Contact PCP  Preferred Pharmacy (with phone number or street name): [id number] - [place], [address] [phone] (Phone) [phone] (Fax)    Agent: Please be advised that RX refills may take up to 3 business days. We ask that you follow-up with your pharmacy.

## 2019-04-04 ENCOUNTER — Ambulatory Visit (INDEPENDENT_AMBULATORY_CARE_PROVIDER_SITE_OTHER): Payer: Medicare Other | Admitting: Internal Medicine

## 2019-04-04 ENCOUNTER — Other Ambulatory Visit: Payer: Self-pay

## 2019-04-04 DIAGNOSIS — I10 Essential (primary) hypertension: Secondary | ICD-10-CM | POA: Diagnosis not present

## 2019-04-04 DIAGNOSIS — E119 Type 2 diabetes mellitus without complications: Secondary | ICD-10-CM

## 2019-04-04 NOTE — Progress Notes (Signed)
Virtual Visit via Telephone Note  I connected with Cynthia Herrera on 04/04/19 at 11:30 AM EDT by telephone and verified that I am speaking with the correct person using two identifiers.   I discussed the limitations, risks, security and privacy concerns of performing an evaluation and management service by telephone and the availability of in person appointments. I also discussed with the patient that there may be a patient responsible charge related to this service. The patient expressed understanding and agreed to proceed.  We initially attempted to connect via video chat but were unable to due to technical difficulties on the patient's end, so we converted this visit to a phone visit.'  Location patient: home Location provider: work office Participants present for the call: patient, provider Patient did not have a visit in the prior 7 days to address this/these issue(s).   History of Present Illness:  This is a scheduled visit for DM f/u after she decided to cut metformin dose in half 3 months ago. Her A1c then was 5.4. Her CBGs on average have been in the 120s, her highest value has been 160 but this is rare. No hypoglycemic events. She is feeling well.   Observations/Objective: Patient sounds cheerful and well on the phone. I do not appreciate any increased work of breathing. Speech and thought processing are grossly intact. Patient reported vitals: none reported   Current Outpatient Medications:  .  amoxicillin (AMOXIL) 500 MG capsule, Take 2,000 mg by mouth See admin instructions. Take 2000 mg by mouth 1 hour prior to dental appointment, Disp: , Rfl: 5 .  atorvastatin (LIPITOR) 40 MG tablet, TAKE 1 TABLET AT BEDTIME, Disp: 90 tablet, Rfl: 2 .  Cholecalciferol (VITAMIN D3) 2000 units capsule, Take 2,000 Units by mouth daily., Disp: , Rfl:  .  clotrimazole-betamethasone (LOTRISONE) cream, Apply 1 application topically 2 (two) times daily. (Patient taking differently: Apply  1 application topically 2 (two) times daily as needed (for feet). ), Disp: 30 g, Rfl: 0 .  fexofenadine (ALLEGRA) 180 MG tablet, Take 180 mg by mouth daily as needed for allergies or rhinitis., Disp: , Rfl:  .  fluticasone (FLONASE) 50 MCG/ACT nasal spray, Place 1 spray into both nostrils daily as needed for allergies. , Disp: , Rfl:  .  furosemide (LASIX) 20 MG tablet, TAKE 1 TABLET BY MOUTH TWICE A DAY, Disp: 180 tablet, Rfl: 2 .  glucose blood test strip, Use to check CBG AC and QHS., Disp: 200 each, Rfl: 12 .  KLOR-CON M20 20 MEQ tablet, Take 1 tablet (20 mEq total) by mouth daily., Disp: 90 tablet, Rfl: 3 .  letrozole (FEMARA) 2.5 MG tablet, TAKE 1 TABLET BY MOUTH EVERY DAY, Disp: 90 tablet, Rfl: 3 .  losartan (COZAAR) 25 MG tablet, TAKE 1 TABLET BY MOUTH TWICE A DAY, Disp: 180 tablet, Rfl: 2 .  metFORMIN (GLUCOPHAGE) 500 MG tablet, Take 1 tablet (500 mg total) by mouth daily with breakfast., Disp: 180 tablet, Rfl: 1 .  nebivolol (BYSTOLIC) 2.5 MG tablet, Take 1 tablet (2.5 mg total) by mouth daily., Disp: 90 tablet, Rfl: 1 .  pantoprazole (PROTONIX) 40 MG tablet, TAKE 1 TABLET BY MOUTH EVERY DAY, Disp: 90 tablet, Rfl: 3 .  rivaroxaban (XARELTO) 20 MG TABS tablet, Take 20 mg by mouth daily with supper., Disp: , Rfl:  .  spironolactone (ALDACTONE) 25 MG tablet, TAKE 1 TABLET BY MOUTH EVERY DAY, Disp: 90 tablet, Rfl: 2 .  traMADol (ULTRAM) 50 MG tablet, Take 50  mg by mouth every 6 (six) hours as needed for moderate pain., Disp: , Rfl:   Review of Systems:  Constitutional: Denies fever, chills, diaphoresis, appetite change and fatigue.  HEENT: Denies photophobia, eye pain, redness, hearing loss, ear pain, congestion, sore throat, rhinorrhea, sneezing, mouth sores, trouble swallowing, neck pain, neck stiffness and tinnitus.   Respiratory: Denies SOB, DOE, cough, chest tightness,  and wheezing.   Cardiovascular: Denies chest pain, palpitations and leg swelling.  Gastrointestinal: Denies nausea,  vomiting, abdominal pain, diarrhea, constipation, blood in stool and abdominal distention.  Genitourinary: Denies dysuria, urgency, frequency, hematuria, flank pain and difficulty urinating.  Endocrine: Denies: hot or cold intolerance, sweats, changes in hair or nails, polyuria, polydipsia. Musculoskeletal: Denies myalgias, back pain, joint swelling, arthralgias and gait problem.  Skin: Denies pallor, rash and wound.  Neurological: Denies dizziness, seizures, syncope, weakness, light-headedness, numbness and headaches.  Hematological: Denies adenopathy. Easy bruising, personal or family bleeding history  Psychiatric/Behavioral: Denies suicidal ideation, mood changes, confusion, nervousness, sleep disturbance and agitation   Assessment and Plan:  Diabetes mellitus with coincident hypertension ([place]) -Continue metformin 500 mg once a day. -F/u in office in 3 months for repeat A1c.   I discussed the assessment and treatment plan with the patient. The patient was provided an opportunity to ask questions and all were answered. The patient agreed with the plan and demonstrated an understanding of the instructions.   The patient was advised to call back or seek an in-person evaluation if the symptoms worsen or if the condition fails to improve as anticipated.  I provided 13 minutes of non-face-to-face time during this encounter.   Lelon Frohlich, MD [facility]

## 2019-04-10 ENCOUNTER — Other Ambulatory Visit: Payer: Self-pay | Admitting: Internal Medicine

## 2019-04-13 ENCOUNTER — Other Ambulatory Visit (HOSPITAL_COMMUNITY): Admission: RE | Admit: 2019-04-13 | Payer: Medicare Other | Source: Ambulatory Visit

## 2019-04-13 NOTE — Progress Notes (Signed)
Covid screen rescheduled for 6/22

## 2019-04-16 ENCOUNTER — Other Ambulatory Visit: Payer: Self-pay | Admitting: Cardiology

## 2019-04-16 ENCOUNTER — Other Ambulatory Visit (HOSPITAL_COMMUNITY)
Admission: RE | Admit: 2019-04-16 | Discharge: 2019-04-16 | Disposition: A | Discharge status: Home / Self Care | Payer: Medicare Other | Source: Ambulatory Visit | Attending: Internal Medicine | Admitting: Internal Medicine

## 2019-04-16 DIAGNOSIS — Z1159 Encounter for screening for other viral diseases: Secondary | ICD-10-CM | POA: Diagnosis present

## 2019-04-16 LAB — SARS CORONAVIRUS 2 (TAT 6-24 HRS): SARS Coronavirus 2: NEGATIVE

## 2019-04-16 NOTE — Telephone Encounter (Signed)
Pt last saw Dr Rayann Heman 03/12/19 telemedicine Covid-19, last labs 11/17/18 Creat 1.17, age 73, weight 103.1kg, CrCl 69.7, based on CrCl pt is on appropriate dosage of Xarelto 20mg  QD.  Will refill rx.

## 2019-04-18 ENCOUNTER — Ambulatory Visit: Payer: Medicare Other | Admitting: Primary Care

## 2019-04-18 ENCOUNTER — Ambulatory Visit (INDEPENDENT_AMBULATORY_CARE_PROVIDER_SITE_OTHER): Payer: Medicare Other | Admitting: Primary Care

## 2019-04-18 ENCOUNTER — Encounter: Payer: Self-pay | Admitting: Primary Care

## 2019-04-18 ENCOUNTER — Ambulatory Visit (INDEPENDENT_AMBULATORY_CARE_PROVIDER_SITE_OTHER): Payer: Medicare Other | Admitting: Internal Medicine

## 2019-04-18 ENCOUNTER — Other Ambulatory Visit: Payer: Self-pay

## 2019-04-18 DIAGNOSIS — R0602 Shortness of breath: Secondary | ICD-10-CM | POA: Diagnosis not present

## 2019-04-18 DIAGNOSIS — R942 Abnormal results of pulmonary function studies: Secondary | ICD-10-CM

## 2019-04-18 DIAGNOSIS — J849 Interstitial pulmonary disease, unspecified: Secondary | ICD-10-CM | POA: Diagnosis not present

## 2019-04-18 LAB — PULMONARY FUNCTION TEST
DL/VA % pred: 110 %
DL/VA: 4.48 ml/min/mmHg/L
DLCO unc % pred: 85 %
DLCO unc: 17.52 ml/min/mmHg
FEF 25-75 Pre: 3.21 L/sec
FEF2575-%Pred-Pre: 171 %
FEV1-%Pred-Pre: 99 %
FEV1-Pre: 2.35 L
FEV1FVC-%Pred-Pre: 111 %
FEV6-%Pred-Pre: 93 %
FEV6-Pre: 2.81 L
FEV6FVC-%Pred-Pre: 104 %
FVC-%Pred-Pre: 89 %
FVC-Pre: 2.81 L
Pre FEV1/FVC ratio: 84 %
Pre FEV6/FVC Ratio: 100 %

## 2019-04-18 NOTE — Progress Notes (Signed)
@Patient  ID: Cynthia Herrera, female    DOB: Dec 04, 1945, 73 y.o.   MRN: [id number]  Chief Complaint  Patient presents with  . Follow-up    Referring provider: Isaac Bliss, [facility]*  HPI: 73 year old female, former smoker. PMH significant for chronic systolic CHF, breast cancer, COPD, chronic back pain. Patient of Dr. Chase Caller, last seen on 11/02/18. Followed by Dyspnea and abnormal PFTs with decreased DLCO 58%. 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   04/18/2019 Patient presents today for follow up visit with PFTs. Stopped amiodarone in January. S/p ablation in February. She is doing well, states that her breathing has improved. Still has some sob with exertion. Continues Xarelto. Reports that her Afib is more under control now. Denies fever, wheezing, cough.   PFTs: 04/18/2019 -FVC 2.81 (89%), FEV1 2.35 (99%), ratio 84, DLCO 17.52 (85%)  08/24/18 - FVC 2.71 (85), FEV1 2.32 (96%), ratio 86, DLCO 15.65 (58%)  Imaging: 10/03/18 HRCT- Extremely subtle ground-glass attenuation noted throughout the lung bilaterally. The possibility of early interstitial lung diease is not excluded. CT recommended in 12 months to assess for temporal changes. Mild air trapping infiltrative of small airway disease   Simple office walk 250 feet x  3 laps goal with forehead probe 11/02/2018  04/18/2019   O2 used Room air Room air  Number laps completed 2 and stopped due to dyspnea, and back pain 2 laps  Comments about pace nomral Normal  Resting Pulse Ox/HR 99% and 58/min 98% and 58/min  Final Pulse Ox/HR 98% and 93/min 96% and 91/min  Desaturated </= 88% no No  Desaturated <= 3% points no No  Got Tachycardic >/= 90/min yes yes  Symptoms at end of test dyspne and back pain 2-3 dyspnea on scale   Miscellaneous comments Stopped at 2 laps  Stopped at 2 laps    Allergies  Allergen Reactions  . Tikosyn [Dofetilide]     Prolonged QT/QTc  . Zolpidem Other (See Comments)    Hallucinations, up  walking around ? Hallucinations, up walking around  . Codeine Other (See Comments)  . Codeine Phosphate Nausea And Vomiting  . Methylprednisolone Other (See Comments)    Felt really weird w the high dose oral steroid, but tolerates low doses or oral steroid Felt really weird w the high dose oral steroid, but tolerates low doses or oral steroid    Immunization History  Administered Date(s) Administered  . Influenza Split 09/29/2011, 07/20/2012  . Influenza Whole 08/14/2008  . Influenza, High Dose Seasonal PF 09/09/2017  . Influenza,inj,Quad PF,6+ Mos 10/23/2013, 08/20/2014, 08/14/2015, 08/27/2016, 08/09/2018  . Pneumococcal Conjugate-13 10/23/2013  . Pneumococcal Polysaccharide-23 06/18/2011  . Tdap 06/18/2011  . Zoster 07/20/2012    Past Medical History:  Diagnosis Date  . Arthritis   . Breast cancer, left breast ([employer]) 2002   "then treated w/chemo and radiation"  . Breast cancer, left breast ([facility]) 07/14/2017   "tx'd w/mastectomy"  . Chronic back pain    h/o lumbar stenosis; "no problem since my OR" (`10/27/2017)  . Chronic systolic CHF (congestive heart failure) ([facility])   . Complication of anesthesia    last surgery iv med when going to sleep"burned as injected"  . COPD (chronic obstructive pulmonary disease) ([facility])    no inhalers   . DCM (dilated cardiomyopathy) ([facility])    EF 15-20% ? tachycardia induced - EF 35-40% by echo 05/2018  . Dyspnea    with exertion  . Gallstones   . GERD (gastroesophageal reflux disease)  once in a while;depends on what she eats  . History of bronchitis    "chronic when I smoked; no problem since I quit in 2002" (10/27/2017)  . Hyperlipidemia   . Hypertension    takes Losartan and Metoprolol.   . Joint pain   . Muscle spasm    takes Flexeril daily as needed   . Nocturia   . OSA (obstructive sleep apnea) 08/22/2018   Severe obstructive sleep apnea with an AHI 47.3/h and no significant central sleep apnea with moderate oxygen desaturations as low  as 79%  . Osteopenia   . Persistent atrial fibrillation    s/p TEE DCCV and repeat DCCV 11/14/2013 and 02/2018  . Personal history of colonic polyps    adenomas 03 and 08  . Pneumonia    hx  . Seasonal allergies   . Thyroid nodule   . Type II diabetes mellitus ([facility])   . Vitamin D deficiency    takes Vit D    Tobacco History: Social History   Tobacco Use  Smoking Status Former Smoker  . Packs/day: 1.50  . Years: 35.00  . Pack years: 52.50  . Types: Cigarettes  . Quit date: 08/24/2001  . Years since quitting: 17.6  Smokeless Tobacco Never Used   Counseling given: Not Answered   Outpatient Medications Prior to Visit  Medication Sig Dispense Refill  . amoxicillin (AMOXIL) 500 MG capsule Take 2,000 mg by mouth See admin instructions. Take 2000 mg by mouth 1 hour prior to dental appointment  5  . atorvastatin (LIPITOR) 40 MG tablet TAKE 1 TABLET AT BEDTIME 90 tablet 2  . Cholecalciferol (VITAMIN D3) 2000 units capsule Take 2,000 Units by mouth daily.    . clotrimazole-betamethasone (LOTRISONE) cream Apply 1 application topically 2 (two) times daily. (Patient taking differently: Apply 1 application topically 2 (two) times daily as needed (for feet). ) 30 g 0  . fexofenadine (ALLEGRA) 180 MG tablet Take 180 mg by mouth daily as needed for allergies or rhinitis.    . fluticasone (FLONASE) 50 MCG/ACT nasal spray Place 1 spray into both nostrils daily as needed for allergies.     . furosemide (LASIX) 20 MG tablet TAKE 1 TABLET BY MOUTH TWICE A DAY 180 tablet 2  . glucose blood test strip Use to check CBG AC and QHS. 200 each 12  . KLOR-CON M20 20 MEQ tablet Take 1 tablet (20 mEq total) by mouth daily. 90 tablet 3  . letrozole (FEMARA) 2.5 MG tablet TAKE 1 TABLET BY MOUTH EVERY DAY 90 tablet 3  . losartan (COZAAR) 25 MG tablet TAKE 1 TABLET BY MOUTH TWICE A DAY 180 tablet 2  . metFORMIN (GLUCOPHAGE) 500 MG tablet Take 1 tablet (500 mg total) by mouth daily with breakfast. 180 tablet 1   . nebivolol (BYSTOLIC) 2.5 MG tablet Take 1 tablet (2.5 mg total) by mouth daily. 90 tablet 1  . pantoprazole (PROTONIX) 40 MG tablet TAKE 1 TABLET BY MOUTH EVERY DAY 90 tablet 3  . spironolactone (ALDACTONE) 25 MG tablet TAKE 1 TABLET BY MOUTH EVERY DAY 90 tablet 2  . traMADol (ULTRAM) 50 MG tablet Take 50 mg by mouth every 6 (six) hours as needed for moderate pain.    Marland Kitchen XARELTO 20 MG TABS tablet TAKE 1 TABLET BY MOUTH EVERY DAY WITH SUPPER. 90 tablet 1   No facility-administered medications prior to visit.     Review of Systems  Review of Systems  Constitutional: Negative.   Respiratory: Negative  for cough, shortness of breath and wheezing.    Physical Exam  BP 120/60   Pulse 62   Temp (!) 97 F (36.1 C)   SpO2 98%  Physical Exam Constitutional:      Appearance: Normal appearance.  Pulmonary:     Breath sounds: No wheezing or rhonchi.     Comments: Lungs clear Neurological:     Mental Status: She is alert.      Lab Results:  CBC    Component Value Date/Time   WBC 8.1 11/17/2018 1324   WBC 9.1 01/02/2018 1451   RBC 4.23 11/17/2018 1324   RBC 4.19 01/02/2018 1451   HGB 11.8 11/17/2018 1324   HGB 12.8 12/10/2009 1401   HCT 36.1 11/17/2018 1324   HCT 37.3 12/10/2009 1401   PLT 386 11/17/2018 1324   MCV 85 11/17/2018 1324   MCV 89.0 12/10/2009 1401   MCH 27.9 11/17/2018 1324   MCH 29.8 01/02/2018 1451   MCHC 32.7 11/17/2018 1324   MCHC 33.1 01/02/2018 1451   RDW 13.3 11/17/2018 1324   RDW 12.6 12/10/2009 1401   LYMPHSABS 1.0 02/03/2017 1207   LYMPHSABS 1.4 12/10/2009 1401   MONOABS 0.8 02/03/2017 1207   MONOABS 0.5 12/10/2009 1401   EOSABS 0.3 02/03/2017 1207   EOSABS 0.2 12/10/2009 1401   BASOSABS 0.1 02/03/2017 1207   BASOSABS 0.0 12/10/2009 1401    BMET    Component Value Date/Time   NA 143 11/17/2018 1324   K 4.6 11/17/2018 1324   CL 106 11/17/2018 1324   CO2 18 (L) 11/17/2018 1324   GLUCOSE 85 11/17/2018 1324   GLUCOSE 100 (H) 08/23/2018  1510   BUN 23 11/17/2018 1324   CREATININE 1.17 (H) 11/17/2018 1324   CREATININE 1.06 (H) 08/19/2015 1244   CALCIUM 9.6 11/17/2018 1324   GFRNONAA 46 (L) 11/17/2018 1324   GFRAA 53 (L) 11/17/2018 1324    BNP No results found for: BNP  ProBNP    Component Value Date/Time   PROBNP 58.0 11/18/2014 1649    Imaging: US Thyroid  Result Date: 03/27/2019 CLINICAL DATA:  Goiter. Multinodular thyroid gland status post biopsy of isthmic nodule on 01/19/2018. EXAM: THYROID ULTRASOUND TECHNIQUE: Ultrasound examination of the thyroid gland and adjacent soft tissues was performed. COMPARISON:  Prior thyroid ultrasound 12/22/2017 FINDINGS: Parenchymal Echotexture: Moderately heterogenous Isthmus: 0.6 cm Right lobe: 4.9 x 1.6 x 1.6 cm Left lobe: 6.7 x 2.2 x 2.3 cm _________________________________________________________ Estimated total number of nodules >/= 1 cm: 2 Number of spongiform nodules >/=  2 cm not described below (TR1): 0 Number of mixed cystic and solid nodules >/= 1.5 cm not described below (TR2): 0 _________________________________________________________ The previously biopsied nodule in the thyroid isthmus remains essentially unchanged at 3.1 x 3.0 x 1.8 cm. Nodule # 3: Prior biopsy: No Location: Left; Inferior Maximum size: Approximately 2.4 cm; Other 2 dimensions: 1.9 x 1.9 cm, previously, 1.9 x 1.5 x 1.7 cm Composition: solid/almost completely solid (2) Echogenicity: isoechoic (1) Shape: not taller-than-wide (0) Margins: ill-defined (0) Echogenic foci: none (0) ACR TI-RADS total points: 3. ACR TI-RADS risk category:  TR3 (3 points). Significant change in size (>/= 20% in two dimensions and minimal increase of 2 mm): Yes Change in features: No Change in ACR TI-RADS risk category: No ACR TI-RADS recommendations: *Given size (>/= 1.5 - 2.4 cm) and appearance, a follow-up ultrasound in 1 year should be considered based on TI-RADS criteria. _________________________________________________________  IMPRESSION: 1. Questionable interval enlargement of the TI-RADS category  3 nodule in the left inferior gland (labeled # 3). Precise measurements are difficult given the overall heterogeneity of the thyroid gland in the very inferior position of this nodule. This nodule continues to meet criteria for annual ultrasound surveillance. 2. No significant interval change in the size or appearance of the previously biopsied nodule in the thyroid isthmus. The above is in keeping with the ACR TI-RADS recommendations - J Am Coll Radiol 2017;14:587-595. Electronically Signed   By: Jacqulynn Cadet M.D.   On: 03/27/2019 16:33     Assessment & Plan:   Abnormal PFT - Abnormal PFTs IN 2019 with decreased DLCO 58%. 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 Stopped amiodarone in January 2020 and s/p ablation in February. Repeat PFTs 04/18/2019 showed significant improvement in diffusion capacity up to 85%. Patient symptomatically feels better. Plan repeat HRCT in 3-6 months   Follow-up in 6 months with MR  OR sooner if symptoms worsen    Martyn Ehrich, NP 04/19/2019

## 2019-04-18 NOTE — Progress Notes (Signed)
Spiro/DLCO performed today. 

## 2019-04-18 NOTE — Patient Instructions (Addendum)
Diffusion capacity of your lungs has improved to 85% (from 58%) since stopping amiodarone   Will discuss with Dr. Chase Caller if and when he wants to repeat imaging   Follow-up in 6 months with MR  OR sooner if symptoms worsen

## 2019-04-19 DIAGNOSIS — R942 Abnormal results of pulmonary function studies: Secondary | ICD-10-CM | POA: Insufficient documentation

## 2019-04-19 NOTE — Assessment & Plan Note (Addendum)
-   Abnormal PFTs IN 2019 with decreased DLCO 58%. 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 Stopped amiodarone in January 2020 and s/p ablation in February. Repeat PFTs 04/18/2019 showed significant improvement in diffusion capacity up to 85%. Patient symptomatically feels better. Plan repeat HRCT in 3-6 months   Follow-up in 6 months with MR  OR sooner if symptoms worsen

## 2019-04-23 ENCOUNTER — Other Ambulatory Visit: Payer: Self-pay | Admitting: Hematology and Oncology

## 2019-04-23 DIAGNOSIS — Z1231 Encounter for screening mammogram for malignant neoplasm of breast: Secondary | ICD-10-CM

## 2019-05-10 ENCOUNTER — Encounter: Payer: Self-pay | Admitting: Podiatry

## 2019-05-10 ENCOUNTER — Other Ambulatory Visit: Payer: Self-pay

## 2019-05-10 ENCOUNTER — Telehealth: Payer: Self-pay | Admitting: *Deleted

## 2019-05-10 ENCOUNTER — Ambulatory Visit (INDEPENDENT_AMBULATORY_CARE_PROVIDER_SITE_OTHER): Payer: Medicare Other | Admitting: Podiatry

## 2019-05-10 ENCOUNTER — Encounter

## 2019-05-10 VITALS — Temp 98.6°F

## 2019-05-10 DIAGNOSIS — B353 Tinea pedis: Secondary | ICD-10-CM

## 2019-05-10 DIAGNOSIS — G5763 Lesion of plantar nerve, bilateral lower limbs: Secondary | ICD-10-CM

## 2019-05-10 MED ORDER — FLUCONAZOLE 150 MG PO TABS: 150.0000 mg | ORAL_TABLET | ORAL | 2 refills | Status: DC

## 2019-05-10 NOTE — Telephone Encounter (Signed)
[facility] - [facility] states Xarelto and Diflucan have a Level 1 reaction status. I told Tammy not to fill and I would inform Dr. March Rummage and call again.

## 2019-05-28 ENCOUNTER — Ambulatory Visit: Payer: Medicare Other

## 2019-05-28 NOTE — Telephone Encounter (Signed)
Will discuss with her this week.

## 2019-05-31 ENCOUNTER — Other Ambulatory Visit: Payer: Self-pay

## 2019-05-31 ENCOUNTER — Ambulatory Visit: Payer: Medicare Other | Admitting: Podiatry

## 2019-05-31 ENCOUNTER — Encounter: Payer: Self-pay | Admitting: Podiatry

## 2019-05-31 DIAGNOSIS — B353 Tinea pedis: Secondary | ICD-10-CM | POA: Diagnosis not present

## 2019-05-31 DIAGNOSIS — G5763 Lesion of plantar nerve, bilateral lower limbs: Secondary | ICD-10-CM

## 2019-05-31 NOTE — Progress Notes (Signed)
Subjective:  Patient ID: Cynthia Herrera, female    DOB: [birth year],  MRN: [id number]  Chief Complaint  Patient presents with  . Foot Pain    right foot ankle/ skin breakout, pt states that her skin is starting to break out, and that she would like to get a cream for it    73 y.o. female presents with the above complaint. Hx as above. Also complaining of numbness in the ball of both feet worst at the 3rd/4th toes.  Review of Systems: Negative except as noted in the HPI. Denies N/V/F/Ch.  Past Medical History:  Diagnosis Date  . Arthritis   . Breast cancer, left breast ([facility]) 2002   "then treated w/chemo and radiation"  . Breast cancer, left breast ([facility]) 07/14/2017   "tx'd w/mastectomy"  . Chronic back pain    h/o lumbar stenosis; "no problem since my OR" (`10/27/2017)  . Chronic systolic CHF (congestive heart failure) ([facility])   . Complication of anesthesia    last surgery iv med when going to sleep"burned as injected"  . COPD (chronic obstructive pulmonary disease) ([facility])    no inhalers   . DCM (dilated cardiomyopathy) ([facility])    EF 15-20% ? tachycardia induced - EF 35-40% by echo 05/2018  . Dyspnea    with exertion  . Gallstones   . GERD (gastroesophageal reflux disease)    once in a while;depends on what she eats  . History of bronchitis    "chronic when I smoked; no problem since I quit in 2002" (10/27/2017)  . Hyperlipidemia   . Hypertension    takes Losartan and Metoprolol.   . Joint pain   . Muscle spasm    takes Flexeril daily as needed   . Nocturia   . OSA (obstructive sleep apnea) 08/22/2018   Severe obstructive sleep apnea with an AHI 47.3/h and no significant central sleep apnea with moderate oxygen desaturations as low as 79%  . Osteopenia   . Persistent atrial fibrillation    s/p TEE DCCV and repeat DCCV 11/14/2013 and 02/2018  . Personal history of colonic polyps    adenomas 03 and 08  . Pneumonia    hx  . Seasonal allergies   . Thyroid nodule   . Type II  diabetes mellitus ([facility])   . Vitamin D deficiency    takes Vit D    Current Outpatient Medications:  .  amoxicillin (AMOXIL) 500 MG capsule, Take 2,000 mg by mouth See admin instructions. Take 2000 mg by mouth 1 hour prior to dental appointment, Disp: , Rfl: 5 .  atorvastatin (LIPITOR) 40 MG tablet, TAKE 1 TABLET AT BEDTIME, Disp: 90 tablet, Rfl: 2 .  Cholecalciferol (VITAMIN D3) 2000 units capsule, Take 2,000 Units by mouth daily., Disp: , Rfl:  .  clotrimazole-betamethasone (LOTRISONE) cream, Apply 1 application topically 2 (two) times daily. (Patient taking differently: Apply 1 application topically 2 (two) times daily as needed (for feet). ), Disp: 30 g, Rfl: 0 .  fexofenadine (ALLEGRA) 180 MG tablet, Take 180 mg by mouth daily as needed for allergies or rhinitis., Disp: , Rfl:  .  fluticasone (FLONASE) 50 MCG/ACT nasal spray, Place 1 spray into both nostrils daily as needed for allergies. , Disp: , Rfl:  .  furosemide (LASIX) 20 MG tablet, TAKE 1 TABLET BY MOUTH TWICE A DAY, Disp: 180 tablet, Rfl: 2 .  glucose blood test strip, Use to check CBG AC and QHS., Disp: 200 each, Rfl: 12 .  KLOR-CON M20 20  MEQ tablet, Take 1 tablet (20 mEq total) by mouth daily., Disp: 90 tablet, Rfl: 3 .  letrozole (FEMARA) 2.5 MG tablet, TAKE 1 TABLET BY MOUTH EVERY DAY, Disp: 90 tablet, Rfl: 3 .  losartan (COZAAR) 25 MG tablet, TAKE 1 TABLET BY MOUTH TWICE A DAY, Disp: 180 tablet, Rfl: 2 .  metFORMIN (GLUCOPHAGE) 500 MG tablet, Take 1 tablet (500 mg total) by mouth daily with breakfast., Disp: 180 tablet, Rfl: 1 .  nebivolol (BYSTOLIC) 2.5 MG tablet, Take 1 tablet (2.5 mg total) by mouth daily., Disp: 90 tablet, Rfl: 1 .  pantoprazole (PROTONIX) 40 MG tablet, TAKE 1 TABLET BY MOUTH EVERY DAY, Disp: 90 tablet, Rfl: 3 .  spironolactone (ALDACTONE) 25 MG tablet, TAKE 1 TABLET BY MOUTH EVERY DAY, Disp: 90 tablet, Rfl: 2 .  traMADol (ULTRAM) 50 MG tablet, Take 50 mg by mouth every 6 (six) hours as needed for moderate  pain., Disp: , Rfl:  .  XARELTO 20 MG TABS tablet, TAKE 1 TABLET BY MOUTH EVERY DAY WITH SUPPER., Disp: 90 tablet, Rfl: 1 .  fluconazole (DIFLUCAN) 150 MG tablet, Take 1 tablet (150 mg total) by mouth once a week., Disp: 6 tablet, Rfl: 2  Social History   Tobacco Use  Smoking Status Former Smoker  . Packs/day: 1.50  . Years: 35.00  . Pack years: 52.50  . Types: Cigarettes  . Quit date: 08/24/2001  . Years since quitting: 17.7  Smokeless Tobacco Never Used    Allergies  Allergen Reactions  . Tikosyn [Dofetilide]     Prolonged QT/QTc  . Zolpidem Other (See Comments)    Hallucinations, up walking around ? Hallucinations, up walking around  . Codeine Other (See Comments)  . Codeine Phosphate Nausea And Vomiting  . Methylprednisolone Other (See Comments)    Felt really weird w the high dose oral steroid, but tolerates low doses or oral steroid Felt really weird w the high dose oral steroid, but tolerates low doses or oral steroid   Objective:   Vitals:   05/10/19 1521  Temp: 98.6 F (37 C)   There is no height or weight on file to calculate BMI. Constitutional Well developed. Well nourished.  Vascular Dorsalis pedis pulses palpable bilaterally. Posterior tibial pulses palpable bilaterally. Capillary refill normal to all digits.  No cyanosis or clubbing noted. Pedal hair growth normal.  Neurologic Normal speech. Oriented to person, place, and time. Epicritic sensation to light touch grossly present bilaterally. Decreased sensation 3rd/4th toes  Dermatologic Scaling skin lesion bilateral lower legs  Orthopedic: POP 3rd IS bilat   Radiographs: none Assessment:   1. Tinea pedis of both feet   2. Morton's neuroma of both feet    Plan:  Patient was evaluated and treated and all questions answered.  Tinea Pedis bilateral -Rx fluconazole weekly. Educated on use.  Interdigital Neuroma, bilaterally -Educated on etiology -Interspace injection delivered as below.  -Educated on padding and proper shoegear  Procedure: Neuroma Injection Location: Bilateral 3rd interspace Skin Prep: Alcohol. Injectate: 0.5 cc 0.5% marcaine plain, 0.5 cc dexamethasone phosphate. Disposition: Patient tolerated procedure well. Injection site dressed with a band-aid.  Return in about 3 weeks (around 05/31/2019) for Neuroma, Bilateral.

## 2019-05-31 NOTE — Progress Notes (Signed)
  Subjective:  Patient ID: Cynthia Herrera, female    DOB: 05-01-1946,  MRN: [id number]  Chief Complaint  Patient presents with  . Tinea Pedis    BL follow up; "feel like its call cleared up"   73 y.o. female returns for the above complaint.  States the fungal infection cleared up.  States the injections helped as well her feet are not nearly as numb but she still has numbness.  Objective:  There were no vitals filed for this visit. General AA&O x3. Normal mood and affect.  Vascular Pedal pulses palpable.  Neurologic Epicritic sensation grossly intact.  Dermatologic Skin xerotic but without scaling lesions  Orthopedic: POP 3rd interspace bilat.   Assessment & Plan:  Patient was evaluated and treated and all questions answered.  Tinea pedis and onychomycosis -Resolved. Did get fluconazole after discussion with pharmacist  Interdigital Neuroma, bilaterally -Repeat injection as below  Procedure: Neuroma Injection Location: Bilateral 3rd interspace Skin Prep: Alcohol. Injectate: 0.5 cc 0.5% marcaine plain, 0.5 cc dexamethasone phosphate. Disposition: Patient tolerated procedure well. Injection site dressed with a band-aid.   Return in about 3 weeks (around 06/21/2019) for Neuroma, Bilateral.

## 2019-06-07 ENCOUNTER — Other Ambulatory Visit: Payer: Self-pay | Admitting: Cardiology

## 2019-06-11 ENCOUNTER — Telehealth: Payer: Self-pay

## 2019-06-11 NOTE — Telephone Encounter (Signed)
Spoke with pt regarding appt on 06/13/19. Pt stated she will check her vitals prior to her appt and did not have any questions at this time.

## 2019-06-12 ENCOUNTER — Other Ambulatory Visit: Payer: Self-pay | Admitting: *Deleted

## 2019-06-12 DIAGNOSIS — E1151 Type 2 diabetes mellitus with diabetic peripheral angiopathy without gangrene: Secondary | ICD-10-CM

## 2019-06-12 MED ORDER — METFORMIN HCL 500 MG PO TABS: 500.0000 mg | ORAL_TABLET | Freq: Every day | ORAL | 1 refills | Status: DC

## 2019-06-13 ENCOUNTER — Encounter: Payer: Self-pay | Admitting: Internal Medicine

## 2019-06-13 ENCOUNTER — Other Ambulatory Visit: Payer: Self-pay

## 2019-06-13 ENCOUNTER — Telehealth (INDEPENDENT_AMBULATORY_CARE_PROVIDER_SITE_OTHER): Payer: Medicare Other | Admitting: Internal Medicine

## 2019-06-13 VITALS — Ht 66.0 in | Wt 218.0 lb

## 2019-06-13 DIAGNOSIS — I4819 Other persistent atrial fibrillation: Secondary | ICD-10-CM

## 2019-06-13 DIAGNOSIS — G4733 Obstructive sleep apnea (adult) (pediatric): Secondary | ICD-10-CM

## 2019-06-13 NOTE — Progress Notes (Signed)
Electrophysiology TeleHealth Note  Due to national recommendations of social distancing due to [place] 19, an audio telehealth visit is felt to be most appropriate for this patient at this time.  Verbal consent was obtained by me for the telehealth visit today.  The patient does not have capability for a virtual visit.  A phone visit is therefore required today.   Date:  06/13/2019   ID:  Cynthia Herrera, DOB 1946/03/05, MRN [id number]  Location: patient's home  Provider location:  [facility]  Evaluation Performed: Follow-up visit  PCP:  Isaac Bliss, Rayford Halsted, MD   Electrophysiologist:  Dr Rayann Heman  Chief Complaint:  palpitations  History of Present Illness:    Cynthia Herrera is a 73 y.o. female who presents via telehealth conferencing today.  Since last being seen in our clinic, the patient reports doing very well.  Today, she denies symptoms of palpitations, chest pain, lower extremity edema, dizziness, presyncope, or syncope.  She does not tolerate the heat.  + SOB.  No afib.  The patient is otherwise without complaint today.  The patient denies symptoms of fevers, chills, cough, or new SOB worrisome for COVID 19.  Past Medical History:  Diagnosis Date  . Arthritis   . Breast cancer, left breast ([place]) 2002   "then treated w/chemo and radiation"  . Breast cancer, left breast ([place]) 07/14/2017   "tx'd w/mastectomy"  . Chronic back pain    h/o lumbar stenosis; "no problem since my OR" (`10/27/2017)  . Chronic systolic CHF (congestive heart failure) ([place])   . Complication of anesthesia    last surgery iv med when going to sleep"burned as injected"  . COPD (chronic obstructive pulmonary disease) ([facility])    no inhalers   . DCM (dilated cardiomyopathy) ([place])    EF 15-20% ? tachycardia induced - EF 35-40% by echo 05/2018  . Dyspnea    with exertion  . Gallstones   . GERD (gastroesophageal reflux disease)    once in a while;depends on what she eats  . History of  bronchitis    "chronic when I smoked; no problem since I quit in 2002" (10/27/2017)  . Hyperlipidemia   . Hypertension    takes Losartan and Metoprolol.   . Joint pain   . Muscle spasm    takes Flexeril daily as needed   . Nocturia   . OSA (obstructive sleep apnea) 08/22/2018   Severe obstructive sleep apnea with an AHI 47.3/h and no significant central sleep apnea with moderate oxygen desaturations as low as 79%  . Osteopenia   . Persistent atrial fibrillation    s/p TEE DCCV and repeat DCCV 11/14/2013 and 02/2018  . Personal history of colonic polyps    adenomas 03 and 08  . Pneumonia    hx  . Seasonal allergies   . Thyroid nodule   . Type II diabetes mellitus ([place])   . Vitamin D deficiency    takes Vit D    Past Surgical History:  Procedure Laterality Date  . ATRIAL FIBRILLATION ABLATION N/A 12/12/2018   Procedure: ATRIAL FIBRILLATION ABLATION;  Surgeon: Thompson Grayer, MD;  Location: [place] [facility];  Service: Cardiovascular;  Laterality: N/A;  . AXILLARY LYMPH NODE DISSECTION Left 10/2001   persistent intramammary node Archie Endo 03/09/2011  . BACK SURGERY    . BREAST BIOPSY Left 2002  . BREAST BIOPSY Right 06/2017   "node bx was negative"  . BREAST BIOPSY Left 06/2017   "positive for cancer"  .  BREAST LUMPECTOMY WITH RADIOACTIVE SEED LOCALIZATION Right 08/15/2017   Procedure: RIGHT BREAST LUMPECTOMY WITH RADIOACTIVE SEED LOCALIZATION;  Surgeon: Jovita Kussmaul, MD;  Location: [place];  Service: General;  Laterality: Right;  . CARDIAC CATHETERIZATION  2015  . CARDIOVERSION N/A 11/12/2013   Procedure: CARDIOVERSION;  Surgeon: Thayer Headings, MD;  Location: [facility] ENDOSCOPY;  Service: Cardiovascular;  Laterality: N/A;  10:08  Dr. Marissa Nestle, anesthesia present, [place]   60mg ,  propofol 50mg , IV for elective cardioversion....Dr. Cathie Olden delievered synch 120 joules with successful cardioversion to NSR  . CARDIOVERSION N/A 11/12/2013   Procedure: CARDIOVERSION;  Surgeon: Thayer Headings, MD;   Location: [place];  Service: Cardiovascular;  Laterality: N/A;  . CARDIOVERSION N/A 11/14/2013   Procedure: CARDIOVERSION (BEDSIDE);  Surgeon: Larey Dresser, MD;  Location: [place];  Service: Cardiovascular;  Laterality: N/A;  . CARDIOVERSION N/A 02/22/2018   Procedure: CARDIOVERSION;  Surgeon: Sueanne Margarita, MD;  Location: [facility] ENDOSCOPY;  Service: Cardiovascular;  Laterality: N/A;  . CATARACT EXTRACTION W/ INTRAOCULAR LENS  IMPLANT, BILATERAL Bilateral ~ 2016  . COLONOSCOPY  2003, September 2008, April 01, 2011   adenomas 03 and 08, polyp 12, diverticulosis  . COLONOSCOPY WITH PROPOFOL N/A 11/16/2016   Procedure: COLONOSCOPY WITH PROPOFOL;  Surgeon: Gatha Mayer, MD;  Location: [facility] ENDOSCOPY;  Service: Endoscopy;  Laterality: N/A;  . JOINT REPLACEMENT    . LAPAROSCOPIC CHOLECYSTECTOMY  2004  . LEFT AND RIGHT HEART CATHETERIZATION WITH CORONARY ANGIOGRAM N/A 02/01/2014   Procedure: LEFT AND RIGHT HEART CATHETERIZATION WITH CORONARY ANGIOGRAM;  Surgeon: Sueanne Margarita, MD;  Location: [place] CATH LAB;  Service: Cardiovascular;  Laterality: N/A;  . LUMBAR LAMINECTOMY/DECOMPRESSION MICRODISCECTOMY N/A 03/19/2014   Procedure: LUMBAR LAMINECTOMY/DECOMPRESSION MICRODISCECTOMY 4 LEVEL;  Surgeon: Kristeen Miss, MD;  Location: [place] NEURO ORS;  Service: Neurosurgery;  Laterality: N/A;  L1-2 L2-3 L3-4 L4-5 Laminectomy/Foraminotomy  . MASS EXCISION Left 08/15/2017   Procedure: EXCISION OF LEFT BREAST MASS;  Surgeon: Jovita Kussmaul, MD;  Location: [place];  Service: General;  Laterality: Left;  Marland Kitchen MASTECTOMY Left 10/27/2017  . MASTECTOMY PARTIAL / LUMPECTOMY W/ AXILLARY LYMPHADENECTOMY  05/08/2001   Archie Endo 03/09/2011  . PORT-A-CATH REMOVAL  2003  . PORTA CATH INSERTION  2002  . TEE WITHOUT CARDIOVERSION N/A 11/12/2013   Procedure: TRANSESOPHAGEAL ECHOCARDIOGRAM (TEE);  Surgeon: Thayer Headings, MD;  Location: [place];  Service: Cardiovascular;  Laterality: N/A;  pt b/p low, pt buccal membranes very dry, lips  scapped, pt c/o thirst. NPO since MN and iv FLUIDS TOTAL INFUSING AT TOTAL 20ML HR....  Dr. Cathie Olden order allow NS to bolus during procedure....very dry,NS bolus 250 ml total..pt responding well to meds..  . TOTAL KNEE ARTHROPLASTY Left 2006  . TOTAL KNEE ARTHROPLASTY Right 05/10/2016   Procedure: RIGHT TOTAL KNEE ARTHROPLASTY;  Surgeon: Gaynelle Arabian, MD;  Location: [facility] OR[facility];  Service: Orthopedics;  Laterality: Right;  With adductor block  . TOTAL MASTECTOMY Left 10/27/2017   Procedure: LEFT MASTECTOMY;  Surgeon: Jovita Kussmaul, MD;  Location: [place];  Service: General;  Laterality: Left;  . TUBAL LIGATION      Current Outpatient Medications  Medication Sig Dispense Refill  . amoxicillin (AMOXIL) 500 MG capsule Take 2,000 mg by mouth See admin instructions. Take 2000 mg by mouth 1 hour prior to dental appointment  5  . atorvastatin (LIPITOR) 40 MG tablet TAKE 1 TABLET AT BEDTIME 90 tablet 2  . Cholecalciferol (VITAMIN D3) 2000 units capsule Take 2,000 Units by mouth daily.    Marland Kitchen  clotrimazole-betamethasone (LOTRISONE) cream Apply 1 application topically 2 (two) times daily. (Patient taking differently: Apply 1 application topically 2 (two) times daily as needed (for feet). ) 30 g 0  . fexofenadine (ALLEGRA) 180 MG tablet Take 180 mg by mouth daily as needed for allergies or rhinitis.    . fluconazole (DIFLUCAN) 150 MG tablet Take 1 tablet (150 mg total) by mouth once a week. 6 tablet 2  . fluticasone (FLONASE) 50 MCG/ACT nasal spray Place 1 spray into both nostrils daily as needed for allergies.     . furosemide (LASIX) 20 MG tablet TAKE 1 TABLET BY MOUTH TWICE A DAY 180 tablet 2  . glucose blood test strip Use to check CBG AC and QHS. 200 each 12  . KLOR-CON M20 20 MEQ tablet Take 1 tablet (20 mEq total) by mouth daily. 90 tablet 3  . letrozole (FEMARA) 2.5 MG tablet TAKE 1 TABLET BY MOUTH EVERY DAY 90 tablet 3  . losartan (COZAAR) 25 MG tablet TAKE 1 TABLET BY MOUTH TWICE A DAY 180 tablet 2  .  metFORMIN (GLUCOPHAGE) 500 MG tablet Take 1 tablet (500 mg total) by mouth daily with breakfast. 180 tablet 1  . nebivolol (BYSTOLIC) 2.5 MG tablet Take 1 tablet (2.5 mg total) by mouth daily. 90 tablet 1  . pantoprazole (PROTONIX) 40 MG tablet TAKE 1 TABLET BY MOUTH EVERY DAY 90 tablet 3  . spironolactone (ALDACTONE) 25 MG tablet TAKE 1 TABLET BY MOUTH EVERY DAY 90 tablet 2  . traMADol (ULTRAM) 50 MG tablet Take 50 mg by mouth every 6 (six) hours as needed for moderate pain.    Marland Kitchen XARELTO 20 MG TABS tablet TAKE 1 TABLET BY MOUTH EVERY DAY WITH SUPPER. 90 tablet 1   No current facility-administered medications for this visit.     Allergies:   Tikosyn [dofetilide], Zolpidem, Codeine, Codeine phosphate, and Methylprednisolone   Social History:  The patient  reports that she quit smoking about 17 years ago. Her smoking use included cigarettes. She has a 52.50 pack-year smoking history. She has never used smokeless tobacco. She reports that she does not drink alcohol or use drugs.   Family History:  The patient's  family history includes Alzheimer's disease in her sister; Dementia in her sister; Diabetes in her brother, father, mother, and sister; Heart disease in her brother and father; Irritable bowel syndrome in her brother; Liver cancer in her mother; Pancreatic cancer in her mother.   ROS:  Please see the history of present illness.   All other systems are personally reviewed and negative.    Exam:    Vital Signs:  Ht 5\' 6"  (1.676 m)   Wt 218 lb (98.9 kg)   BMI 35.19 kg/m   Well sounding   Labs/Other Tests and Data Reviewed:    Recent Labs: 08/01/2018: ALT 28; TSH 0.488 11/17/2018: BUN 23; Creatinine, Ser 1.17; Hemoglobin 11.8; Platelets 386; Potassium 4.6; Sodium 143   Wt Readings from Last 3 Encounters:  06/13/19 218 lb (98.9 kg)  03/12/19 222 lb (100.7 kg)  01/02/19 227 lb 4.8 oz (103.1 kg)       ASSESSMENT & PLAN:    1.  Persistent afib Doing well post ablation off AAD  therapy Energy remains very good Continue xarleto  2. OSA Using Dr Kae Heller dental appliance  3. HTN Stable No change required today  4. Chronic systolic dysfunction EF previously 45% Repeat echo on return to AF clinic in 3 months   Follow-up:  AF  clinic in 3 months with an echo   Patient Risk:  after full review of this patients clinical status, I feel that they are at moderate risk at this time.  Today, I have spent 15 minutes with the patient with telehealth technology discussing arrhythmia management .    Army Fossa, MD  06/13/2019 3:30 PM     [address] [place] [address] [phone] (office) [phone] (fax)

## 2019-06-21 ENCOUNTER — Other Ambulatory Visit: Payer: Self-pay

## 2019-06-21 ENCOUNTER — Ambulatory Visit (INDEPENDENT_AMBULATORY_CARE_PROVIDER_SITE_OTHER): Payer: Medicare Other | Admitting: Podiatry

## 2019-06-21 DIAGNOSIS — G5763 Lesion of plantar nerve, bilateral lower limbs: Secondary | ICD-10-CM | POA: Diagnosis not present

## 2019-06-25 NOTE — Progress Notes (Signed)
  Subjective:  Patient ID: Cynthia Herrera, female    DOB: [birth year],  MRN: [id number]  Chief Complaint  Patient presents with  . Neuroma    Pt states last injection did not help although previous one did. Pt interested in medication.   73 y.o. female returns for the above complaint.  States the injections helped as well her feet are not nearly as numb but she still has numbness.  Objective:  There were no vitals filed for this visit. General AA&O x3. Normal mood and affect.  Vascular Pedal pulses palpable.  Neurologic Epicritic sensation grossly intact.  Dermatologic Skin xerotic but without scaling lesions  Orthopedic: POP 3rd interspace bilat.   Assessment & Plan:  Patient was evaluated and treated and all questions answered.  Interdigital Neuroma, bilaterally -Repeat injection as below -Commence sclerosing injection at next visit.  Procedure: Neuroma Injection Location: Bilateral 3rd interspace Skin Prep: Alcohol. Injectate: 0.5 cc 0.5% marcaine plain, 0.5 cc dexamethasone phosphate. Disposition: Patient tolerated procedure well. Injection site dressed with a band-aid.    Return in about 3 weeks (around 07/12/2019) for Neuroma, Bilateral, sclerosing injections .

## 2019-07-10 ENCOUNTER — Ambulatory Visit: Payer: Self-pay | Admitting: Internal Medicine

## 2019-07-11 ENCOUNTER — Other Ambulatory Visit: Payer: Self-pay | Admitting: Cardiology

## 2019-07-12 ENCOUNTER — Other Ambulatory Visit: Payer: Self-pay

## 2019-07-12 ENCOUNTER — Ambulatory Visit (INDEPENDENT_AMBULATORY_CARE_PROVIDER_SITE_OTHER): Payer: Medicare Other | Admitting: Podiatry

## 2019-07-12 ENCOUNTER — Encounter: Payer: Self-pay | Admitting: Podiatry

## 2019-07-12 DIAGNOSIS — G5763 Lesion of plantar nerve, bilateral lower limbs: Secondary | ICD-10-CM

## 2019-07-17 NOTE — Progress Notes (Signed)
  Subjective:  Patient ID: Cynthia Herrera, female    DOB: [birth year],  MRN: [id number]  Chief Complaint  Patient presents with  . Neuroma    " my toes and foot are still numb and painful at times, but I think that my back problems contribute to it"    73 y.o. female returns for the above complaint.  History above confirmed with patient  Objective:  There were no vitals filed for this visit. General AA&O x3. Normal mood and affect.  Vascular Pedal pulses palpable.  Neurologic Epicritic sensation grossly intact.  Dermatologic Skin intact  Orthopedic: POP 3rd interspace bilat.   Assessment & Plan:  Patient was evaluated and treated and all questions answered.  Interdigital Neuroma, bilaterally -Sclerosing injections #1 as below  Procedure: Sclerosing Nerve Injection Location: Bilateral 3rd interspace Skin Prep: Alcohol. Injectate: 1.5 cc 4% sclerosing alcohol injection Disposition: Patient tolerated procedure well. Injection site dressed with a band-aid.     Return in about 2 weeks (around 07/26/2019) for Bilateral, Neuroma sclerosing.

## 2019-07-18 ENCOUNTER — Encounter: Payer: Self-pay | Admitting: Internal Medicine

## 2019-07-18 ENCOUNTER — Other Ambulatory Visit: Payer: Self-pay

## 2019-07-18 ENCOUNTER — Ambulatory Visit (INDEPENDENT_AMBULATORY_CARE_PROVIDER_SITE_OTHER): Payer: Medicare Other | Admitting: Internal Medicine

## 2019-07-18 VITALS — BP 118/68 | HR 67 | Temp 97.2°F | Wt 226.1 lb

## 2019-07-18 DIAGNOSIS — Z23 Encounter for immunization: Secondary | ICD-10-CM

## 2019-07-18 DIAGNOSIS — I10 Essential (primary) hypertension: Secondary | ICD-10-CM

## 2019-07-18 DIAGNOSIS — I5042 Chronic combined systolic (congestive) and diastolic (congestive) heart failure: Secondary | ICD-10-CM

## 2019-07-18 DIAGNOSIS — E1151 Type 2 diabetes mellitus with diabetic peripheral angiopathy without gangrene: Secondary | ICD-10-CM

## 2019-07-18 DIAGNOSIS — I70209 Unspecified atherosclerosis of native arteries of extremities, unspecified extremity: Secondary | ICD-10-CM

## 2019-07-18 DIAGNOSIS — E785 Hyperlipidemia, unspecified: Secondary | ICD-10-CM

## 2019-07-18 DIAGNOSIS — I4819 Other persistent atrial fibrillation: Secondary | ICD-10-CM | POA: Diagnosis not present

## 2019-07-18 LAB — POCT GLYCOSYLATED HEMOGLOBIN (HGB A1C): Hemoglobin A1C: 6.3 % — AB (ref 4.0–5.6)

## 2019-07-18 NOTE — Progress Notes (Signed)
Established Patient Office Visit     CC/Reason for Visit: Follow-up chronic conditions  HPI: Cynthia Herrera is a 73 y.o. female who is coming in today for the above mentioned reasons. Past Medical History is significant for: hx of breast ca s/p left mastectomy, arthritis, chronic combined CHF, COPD, hypertension, hyperlipidemia, OSA, osteopenia, thyroid nodule and DM II.  Patient has a hx of breast cancer and sees Dr. Lindi Adie for follow up.  Patient sees Dr. Pierre Bali for CHF and Stephens Shire with cardiology.  She has been doing well since we last spoke.  Is concerned that her sugars have started to increase to the 1 40-150 range.  She states that she was completely out of her metformin for about 3 weeks due to a mishap with her refills.  She has been back on it for about 3 weeks.  She had requested 6 months ago to cut down her metformin from 500 twice a day to once a day.  She is here today mainly for diabetic follow-up.  She has no acute complaints.   Past Medical/Surgical History: Past Medical History:  Diagnosis Date  . Arthritis   . Breast cancer, left breast ([place]) 2002   "then treated w/chemo and radiation"  . Breast cancer, left breast ([place]) 07/14/2017   "tx'd w/mastectomy"  . Chronic back pain    h/o lumbar stenosis; "no problem since my OR" (`10/27/2017)  . Chronic systolic CHF (congestive heart failure) ([place])   . Complication of anesthesia    last surgery iv med when going to sleep"burned as injected"  . COPD (chronic obstructive pulmonary disease) (HCC)    no inhalers   . DCM (dilated cardiomyopathy) ([place])    EF 15-20% ? tachycardia induced - EF 35-40% by echo 05/2018  . Dyspnea    with exertion  . Gallstones   . GERD (gastroesophageal reflux disease)    once in a while;depends on what she eats  . History of bronchitis    "chronic when I smoked; no problem since I quit in 2002" (10/27/2017)  . Hyperlipidemia   . Hypertension    takes Losartan and Metoprolol.    . Joint pain   . Muscle spasm    takes Flexeril daily as needed   . Nocturia   . OSA (obstructive sleep apnea) 08/22/2018   Severe obstructive sleep apnea with an AHI 47.3/h and no significant central sleep apnea with moderate oxygen desaturations as low as 79%  . Osteopenia   . Persistent atrial fibrillation    s/p TEE DCCV and repeat DCCV 11/14/2013 and 02/2018  . Personal history of colonic polyps    adenomas 03 and 08  . Pneumonia    hx  . Seasonal allergies   . Thyroid nodule   . Type II diabetes mellitus ([place])   . Vitamin D deficiency    takes Vit D    Past Surgical History:  Procedure Laterality Date  . ATRIAL FIBRILLATION ABLATION N/A 12/12/2018   Procedure: ATRIAL FIBRILLATION ABLATION;  Surgeon: Thompson Grayer, MD;  Location: [place] [facility];  Service: Cardiovascular;  Laterality: N/A;  . AXILLARY LYMPH NODE DISSECTION Left 10/2001   persistent intramammary node Archie Endo 03/09/2011  . BACK SURGERY    . BREAST BIOPSY Left 2002  . BREAST BIOPSY Right 06/2017   "node bx was negative"  . BREAST BIOPSY Left 06/2017   "positive for cancer"  . BREAST LUMPECTOMY WITH RADIOACTIVE SEED LOCALIZATION Right 08/15/2017   Procedure: RIGHT  BREAST LUMPECTOMY WITH RADIOACTIVE SEED LOCALIZATION;  Surgeon: Jovita Kussmaul, MD;  Location: [facility] [place];  Service: General;  Laterality: Right;  . CARDIAC CATHETERIZATION  2015  . CARDIOVERSION N/A 11/12/2013   Procedure: CARDIOVERSION;  Surgeon: Thayer Headings, MD;  Location: [facility] ENDOSCOPY;  Service: Cardiovascular;  Laterality: N/A;  10:08  Dr. Marissa Nestle, anesthesia present, [place]   60mg ,  propofol 50mg , IV for elective cardioversion....Dr. Cathie Olden delievered synch 120 joules with successful cardioversion to NSR  . CARDIOVERSION N/A 11/12/2013   Procedure: CARDIOVERSION;  Surgeon: Thayer Headings, MD;  Location: [place];  Service: Cardiovascular;  Laterality: N/A;  . CARDIOVERSION N/A 11/14/2013   Procedure: CARDIOVERSION (BEDSIDE);  Surgeon: Larey Dresser, MD;  Location: [place];  Service: Cardiovascular;  Laterality: N/A;  . CARDIOVERSION N/A 02/22/2018   Procedure: CARDIOVERSION;  Surgeon: Sueanne Margarita, MD;  Location: [facility] ENDOSCOPY;  Service: Cardiovascular;  Laterality: N/A;  . CATARACT EXTRACTION W/ INTRAOCULAR LENS  IMPLANT, BILATERAL Bilateral ~ 2016  . COLONOSCOPY  2003, September 2008, April 01, 2011   adenomas 03 and 08, polyp 12, diverticulosis  . COLONOSCOPY WITH PROPOFOL N/A 11/16/2016   Procedure: COLONOSCOPY WITH PROPOFOL;  Surgeon: Gatha Mayer, MD;  Location: [facility] ENDOSCOPY;  Service: Endoscopy;  Laterality: N/A;  . JOINT REPLACEMENT    . LAPAROSCOPIC CHOLECYSTECTOMY  2004  . LEFT AND RIGHT HEART CATHETERIZATION WITH CORONARY ANGIOGRAM N/A 02/01/2014   Procedure: LEFT AND RIGHT HEART CATHETERIZATION WITH CORONARY ANGIOGRAM;  Surgeon: Sueanne Margarita, MD;  Location: [place] CATH LAB;  Service: Cardiovascular;  Laterality: N/A;  . LUMBAR LAMINECTOMY/DECOMPRESSION MICRODISCECTOMY N/A 03/19/2014   Procedure: LUMBAR LAMINECTOMY/DECOMPRESSION MICRODISCECTOMY 4 LEVEL;  Surgeon: Kristeen Miss, MD;  Location: [place] NEURO ORS;  Service: Neurosurgery;  Laterality: N/A;  L1-2 L2-3 L3-4 L4-5 Laminectomy/Foraminotomy  . MASS EXCISION Left 08/15/2017   Procedure: EXCISION OF LEFT BREAST MASS;  Surgeon: Jovita Kussmaul, MD;  Location: [place];  Service: General;  Laterality: Left;  Marland Kitchen MASTECTOMY Left 10/27/2017  . MASTECTOMY PARTIAL / LUMPECTOMY W/ AXILLARY LYMPHADENECTOMY  05/08/2001   Archie Endo 03/09/2011  . PORT-A-CATH REMOVAL  2003  . PORTA CATH INSERTION  2002  . TEE WITHOUT CARDIOVERSION N/A 11/12/2013   Procedure: TRANSESOPHAGEAL ECHOCARDIOGRAM (TEE);  Surgeon: Thayer Headings, MD;  Location: [place];  Service: Cardiovascular;  Laterality: N/A;  pt b/p low, pt buccal membranes very dry, lips scapped, pt c/o thirst. NPO since MN and iv FLUIDS TOTAL INFUSING AT TOTAL 20ML HR....  Dr. Cathie Olden order allow NS to bolus during procedure....very dry,NS bolus  250 ml total..pt responding well to meds..  . TOTAL KNEE ARTHROPLASTY Left 2006  . TOTAL KNEE ARTHROPLASTY Right 05/10/2016   Procedure: RIGHT TOTAL KNEE ARTHROPLASTY;  Surgeon: Gaynelle Arabian, MD;  Location: [facility] ORS;  Service: Orthopedics;  Laterality: Right;  With adductor block  . TOTAL MASTECTOMY Left 10/27/2017   Procedure: LEFT MASTECTOMY;  Surgeon: Jovita Kussmaul, MD;  Location: [place];  Service: General;  Laterality: Left;  . TUBAL LIGATION      Social History:  reports that she quit smoking about 17 years ago. Her smoking use included cigarettes. She has a 52.50 pack-year smoking history. She has never used smokeless tobacco. She reports that she does not drink alcohol or use drugs.  Allergies: Allergies  Allergen Reactions  . Tikosyn [Dofetilide]     Prolonged QT/QTc  . Zolpidem Other (See Comments)    Hallucinations, up walking around ? Hallucinations, up walking around  . Codeine  Other (See Comments)  . Codeine Phosphate Nausea And Vomiting  . Methylprednisolone Other (See Comments)    Felt really weird w the high dose oral steroid, but tolerates low doses or oral steroid Felt really weird w the high dose oral steroid, but tolerates low doses or oral steroid    Family History:  Family History  Problem Relation Age of Onset  . Dementia Sister   . Diabetes Sister   . Alzheimer's disease Sister   . Diabetes Brother        x 2  . Heart disease Brother   . Irritable bowel syndrome Brother   . Liver cancer Mother   . Diabetes Mother   . Pancreatic cancer Mother   . Diabetes Father   . Heart disease Father      Current Outpatient Medications:  .  amoxicillin (AMOXIL) 500 MG capsule, Take 2,000 mg by mouth See admin instructions. Take 2000 mg by mouth 1 hour prior to dental appointment, Disp: , Rfl: 5 .  atorvastatin (LIPITOR) 40 MG tablet, TAKE 1 TABLET BY MOUTH EVERYDAY AT BEDTIME, Disp: 90 tablet, Rfl: 3 .  Cholecalciferol (VITAMIN D3) 2000 units capsule, Take 2,000  Units by mouth daily., Disp: , Rfl:  .  clotrimazole-betamethasone (LOTRISONE) cream, Apply 1 application topically 2 (two) times daily. (Patient taking differently: Apply 1 application topically 2 (two) times daily as needed (for feet). ), Disp: 30 g, Rfl: 0 .  fexofenadine (ALLEGRA) 180 MG tablet, Take 180 mg by mouth daily as needed for allergies or rhinitis., Disp: , Rfl:  .  fluconazole (DIFLUCAN) 150 MG tablet, Take 1 tablet (150 mg total) by mouth once a week., Disp: 6 tablet, Rfl: 2 .  fluticasone (FLONASE) 50 MCG/ACT nasal spray, Place 1 spray into both nostrils daily as needed for allergies. , Disp: , Rfl:  .  furosemide (LASIX) 20 MG tablet, TAKE 1 TABLET BY MOUTH TWICE A DAY, Disp: 180 tablet, Rfl: 2 .  glucose blood test strip, Use to check CBG AC and QHS., Disp: 200 each, Rfl: 12 .  KLOR-CON M20 20 MEQ tablet, Take 1 tablet (20 mEq total) by mouth daily., Disp: 90 tablet, Rfl: 3 .  letrozole (FEMARA) 2.5 MG tablet, TAKE 1 TABLET BY MOUTH EVERY DAY, Disp: 90 tablet, Rfl: 3 .  losartan (COZAAR) 25 MG tablet, TAKE 1 TABLET BY MOUTH TWICE A DAY, Disp: 180 tablet, Rfl: 2 .  metFORMIN (GLUCOPHAGE) 500 MG tablet, Take 1 tablet (500 mg total) by mouth daily with breakfast., Disp: 180 tablet, Rfl: 1 .  nebivolol (BYSTOLIC) 2.5 MG tablet, Take 1 tablet (2.5 mg total) by mouth daily., Disp: 90 tablet, Rfl: 1 .  pantoprazole (PROTONIX) 40 MG tablet, TAKE 1 TABLET BY MOUTH EVERY DAY, Disp: 90 tablet, Rfl: 3 .  spironolactone (ALDACTONE) 25 MG tablet, TAKE 1 TABLET BY MOUTH EVERY DAY, Disp: 90 tablet, Rfl: 2 .  traMADol (ULTRAM) 50 MG tablet, Take 50 mg by mouth every 6 (six) hours as needed for moderate pain., Disp: , Rfl:  .  XARELTO 20 MG TABS tablet, TAKE 1 TABLET BY MOUTH EVERY DAY WITH SUPPER., Disp: 90 tablet, Rfl: 1  Review of Systems:  Constitutional: Denies fever, chills, diaphoresis, appetite change and fatigue.  HEENT: Denies photophobia, eye pain, redness, hearing loss, ear pain,  congestion, sore throat, rhinorrhea, sneezing, mouth sores, trouble swallowing, neck pain, neck stiffness and tinnitus.   Respiratory: Denies SOB, DOE, cough, chest tightness,  and wheezing.   Cardiovascular: Denies chest  pain, palpitations and leg swelling.  Gastrointestinal: Denies nausea, vomiting, abdominal pain, diarrhea, constipation, blood in stool and abdominal distention.  Genitourinary: Denies dysuria, urgency, frequency, hematuria, flank pain and difficulty urinating.  Endocrine: Denies: hot or cold intolerance, sweats, changes in hair or nails, polyuria, polydipsia. Musculoskeletal: Denies myalgias, back pain, joint swelling, arthralgias and gait problem.  Skin: Denies pallor, rash and wound.  Neurological: Denies dizziness, seizures, syncope, weakness, light-headedness, numbness and headaches.  Hematological: Denies adenopathy. Easy bruising, personal or family bleeding history  Psychiatric/Behavioral: Denies suicidal ideation, mood changes, confusion, nervousness, sleep disturbance and agitation    Physical Exam: Vitals:   07/18/19 1154  BP: 118/68  Pulse: 67  Temp: (!) 97.2 F (36.2 C)  TempSrc: Temporal  SpO2: 95%  Weight: 226 lb 1.6 oz (102.6 kg)    Body mass index is 36.49 kg/m.   Constitutional: NAD, calm, comfortable Eyes: PERRL, lids and conjunctivae normal ENMT: Mucous membranes are moist. Posterior pharynx clear of any exudate or lesions. Normal dentition. Tympanic membrane is pearly white, no erythema or bulging. Neck: normal, supple, no masses, no thyromegaly Respiratory: clear to auscultation bilaterally, no wheezing, no crackles. Normal respiratory effort. No accessory muscle use.  Cardiovascular: Regular rate and rhythm, no murmurs / rubs / gallops. No extremity edema. 2+ pedal pulses. No carotid bruits.  Abdomen: no tenderness, no masses palpated. No hepatosplenomegaly. Bowel sounds positive.  Musculoskeletal: no clubbing / cyanosis. No joint  deformity upper and lower extremities. Good ROM, no contractures. Normal muscle tone.  Skin: no rashes, lesions, ulcers. No induration Neurologic: CN 2-12 grossly intact. Sensation intact, DTR normal. Strength 5/5 in all 4.  Psychiatric: Normal judgment and insight. Alert and oriented x 3. Normal mood.    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 -Okay to continue metformin once daily with follow-up in 3 months. -I have advised her on continued lifestyle modifications.  Dyslipidemia -Last LDL was 56 in May 2019, continue atorvastatin.  Essential hypertension -Very well controlled on current regimen.  Persistent atrial fibrillation -She appears to be in sinus rhythm today she did have an ablation in February. -She follows with Dr. Rayann Heman.  Chronic combined systolic and diastolic CHF (congestive heart failure)  (HCC) -Compensated, she is on beta-blocker, ARB, statin, spironolactone, diuretic.   She has received flu vaccine today.   Lelon Frohlich, MD [facility]

## 2019-07-24 ENCOUNTER — Other Ambulatory Visit: Payer: Self-pay | Admitting: Internal Medicine

## 2019-07-24 LAB — HM DIABETES EYE EXAM

## 2019-07-26 ENCOUNTER — Other Ambulatory Visit: Payer: Self-pay

## 2019-07-26 ENCOUNTER — Ambulatory Visit: Payer: Medicare Other | Admitting: Podiatry

## 2019-07-26 DIAGNOSIS — G5761 Lesion of plantar nerve, right lower limb: Secondary | ICD-10-CM | POA: Diagnosis not present

## 2019-07-26 DIAGNOSIS — G5762 Lesion of plantar nerve, left lower limb: Secondary | ICD-10-CM

## 2019-07-26 DIAGNOSIS — G5763 Lesion of plantar nerve, bilateral lower limbs: Secondary | ICD-10-CM

## 2019-07-26 NOTE — Progress Notes (Signed)
  Subjective:  Patient ID: Cynthia Herrera, female    DOB: 08-10-1946,  MRN: [id number]  Chief Complaint  Patient presents with  . Neuroma    Pt states moderate improvement with injections in bilateral neuromas. Pt states she has less numbness. Pt states left is still worse than right.   73 y.o. female returns for the above complaint.  History above confirmed with patient  Objective:  There were no vitals filed for this visit. General AA&O x3. Normal mood and affect.  Vascular Pedal pulses palpable.  Neurologic Epicritic sensation grossly intact.  Dermatologic Skin intact  Orthopedic: POP 3rd interspace bilat.   Assessment & Plan:  Patient was evaluated and treated and all questions answered.  Interdigital Neuroma, bilaterally -Sclerosing injections #2 as below  Procedure: Sclerosing Nerve Injection Location: Bilateral 3rd interspace Skin Prep: Alcohol. Injectate: 1.5 cc 4% sclerosing alcohol injection Disposition: Patient tolerated procedure well. Injection site dressed with a band-aid.    Return in about 2 weeks (around 08/09/2019).

## 2019-08-09 ENCOUNTER — Other Ambulatory Visit: Payer: Self-pay

## 2019-08-09 ENCOUNTER — Other Ambulatory Visit: Payer: Self-pay | Admitting: Internal Medicine

## 2019-08-09 ENCOUNTER — Ambulatory Visit: Payer: Medicare Other | Admitting: Podiatry

## 2019-08-09 DIAGNOSIS — G5763 Lesion of plantar nerve, bilateral lower limbs: Secondary | ICD-10-CM | POA: Diagnosis not present

## 2019-08-09 NOTE — Telephone Encounter (Signed)
Pt last saw Dr Rayann Heman, last labs 11/17/18 Creat 1.17, age 73, weight 102.6, CrCl 69.36 based on CrCl pt is on appropriate dosage of Xarelto 20mg  QD.  Will refill rx.

## 2019-08-14 NOTE — Progress Notes (Signed)
Patient Care Team: Isaac Bliss, Rayford Halsted, MD as PCP - General (Internal Medicine) Sueanne Margarita, MD as PCP - Cardiology (Cardiology)  DIAGNOSIS:    ICD-10-CM   1. Malignant neoplasm of central portion of left breast in female, estrogen receptor positive ([place])  C50.112    Z17.0     SUMMARY OF ONCOLOGIC HISTORY: Oncology History  Malignant neoplasm of central portion of left breast in female, estrogen receptor positive ([place])  2002 Initial Diagnosis   Left breast cancer treated with lumpectomy followed by adjuvant chemotherapy (does not remember chemotherapy drugs) and radiation.  Did not take antiestrogen therapy   07/11/2017 Relapse/Recurrence   Left breast skin papule; biopsy proven to be invasive mammary carcinoma ER 100% PR 90% positive HER-2 negative ratio 1.64; Right breast 1.5 cm indeterminate calcifications biopsy proven to be complex sclerosing lesion   08/15/2017 Surgery   Left breast excision: Invasive carcinoma involving the dermis, ER 100%, PR 90%, HER-2 negative ratio 1.64.  Right lumpectomy: Complex sclerosing lesion    10/27/2017 Surgery   Left simple mastectomy: Recurrent multifocal IDC grade 2 largest spanning 3.5 cm, low to high-grade DCIS, tumor involves nipple and epidermis, margins negative, left chest wall excision IDC involving the anterior margin ER 100%, PR 90%, HER-2 negative; T4 Nx stage 3b   11/14/2017 -  Anti-estrogen oral therapy   Letrozole 2.5 mg daily     CHIEF COMPLIANT: Follow-up of left breast cancer on letrozole therapy  INTERVAL HISTORY: Cynthia Herrera is a 73 y.o. with above-mentioned history of recurrent left breast cancer treated with lumpectomy followed by mastectomy, and who currently on oral antiestrogen therapy with letrozole. I last saw her a year ago. Right breast mammogram on 08/21/18 showed no evidence of malignancy. She presents to the clinic today for annual follow-up. She complains of a nodule in the left posterior chest  wall which has been causing a lot of pain and discomfort.  Is been going on for 2 months at least.  REVIEW OF SYSTEMS:   Constitutional: Denies fevers, chills or abnormal weight loss Eyes: Denies blurriness of vision Ears, nose, mouth, throat, and face: Denies mucositis or sore throat Respiratory: Denies cough, dyspnea or wheezes Cardiovascular: Denies palpitation, chest discomfort Gastrointestinal: Denies nausea, heartburn or change in bowel habits Skin: Denies abnormal skin rashes Lymphatics: Denies new lymphadenopathy or easy bruising Neurological: Denies numbness, tingling or new weaknesses Behavioral/Psych: Mood is stable, no new changes  Extremities: No lower extremity edema Breast: denies any pain or lumps or nodules in right breast or left chest wall All other systems were reviewed with the patient and are negative.  I have reviewed the past medical history, past surgical history, social history and family history with the patient and they are unchanged from previous note.  ALLERGIES:  is allergic to tikosyn [dofetilide]; zolpidem; codeine; codeine phosphate; and methylprednisolone.  MEDICATIONS:  Current Outpatient Medications  Medication Sig Dispense Refill  . amoxicillin (AMOXIL) 500 MG capsule Take 2,000 mg by mouth See admin instructions. Take 2000 mg by mouth 1 hour prior to dental appointment  5  . atorvastatin (LIPITOR) 40 MG tablet TAKE 1 TABLET BY MOUTH EVERYDAY AT BEDTIME 90 tablet 3  . BYSTOLIC 2.5 MG tablet TAKE 1 TABLET BY MOUTH EVERY DAY 90 tablet 1  . Cholecalciferol (VITAMIN D3) 2000 units capsule Take 2,000 Units by mouth daily.    . clotrimazole-betamethasone (LOTRISONE) cream Apply 1 application topically 2 (two) times daily. (Patient taking differently: Apply 1 application topically 2 (  two) times daily as needed (for feet). ) 30 g 0  . fexofenadine (ALLEGRA) 180 MG tablet Take 180 mg by mouth daily as needed for allergies or rhinitis.    . fluconazole  (DIFLUCAN) 150 MG tablet Take 1 tablet (150 mg total) by mouth once a week. 6 tablet 2  . fluticasone (FLONASE) 50 MCG/ACT nasal spray Place 1 spray into both nostrils daily as needed for allergies.     . furosemide (LASIX) 20 MG tablet TAKE 1 TABLET BY MOUTH TWICE A DAY 180 tablet 2  . glucose blood test strip Use to check CBG AC and QHS. 200 each 12  . KLOR-CON M20 20 MEQ tablet Take 1 tablet (20 mEq total) by mouth daily. 90 tablet 3  . letrozole (FEMARA) 2.5 MG tablet TAKE 1 TABLET BY MOUTH EVERY DAY 90 tablet 3  . losartan (COZAAR) 25 MG tablet TAKE 1 TABLET BY MOUTH TWICE A DAY 180 tablet 2  . metFORMIN (GLUCOPHAGE) 500 MG tablet Take 1 tablet (500 mg total) by mouth daily with breakfast. 180 tablet 1  . pantoprazole (PROTONIX) 40 MG tablet TAKE 1 TABLET BY MOUTH EVERY DAY 90 tablet 3  . spironolactone (ALDACTONE) 25 MG tablet TAKE 1 TABLET BY MOUTH EVERY DAY 90 tablet 2  . traMADol (ULTRAM) 50 MG tablet Take 50 mg by mouth every 6 (six) hours as needed for moderate pain.    Marland Kitchen XARELTO 20 MG TABS tablet TAKE 1 TABLET BY MOUTH EVERY DAY WITH SUPPER 90 tablet 1   No current facility-administered medications for this visit.     PHYSICAL EXAMINATION: ECOG PERFORMANCE STATUS: 1 - Symptomatic but completely ambulatory  Vitals:   08/15/19 1535  BP: (!) 124/53  Pulse: 63  Resp: 17  Temp: 98 F (36.7 C)  SpO2: 99%   Filed Weights   08/15/19 1535  Weight: 223 lb 1.6 oz (101.2 kg)    GENERAL: alert, no distress and comfortable SKIN: skin color, texture, turgor are normal, no rashes or significant lesions EYES: normal, Conjunctiva are pink and non-injected, sclera clear OROPHARYNX: no exudate, no erythema and lips, buccal mucosa, and tongue normal  NECK: supple, thyroid normal size, non-tender, without nodularity LYMPH: no palpable lymphadenopathy in the cervical, axillary or inguinal LUNGS: clear to auscultation and percussion with normal breathing effort HEART: regular rate &  rhythm and no murmurs and no lower extremity edema ABDOMEN: abdomen soft, non-tender and normal bowel sounds MUSCULOSKELETAL: no cyanosis of digits and no clubbing  NEURO: alert & oriented x 3 with fluent speech, no focal motor/sensory deficits EXTREMITIES: No lower extremity edema BREAST: Left mastectomy scar no palpable nodules of concern.  Right breast no lumps or nodules.. (exam performed in the presence of a chaperone)  LABORATORY DATA:  I have reviewed the data as listed CMP Latest Ref Rng & Units 11/17/2018 08/23/2018 08/01/2018  Glucose 65 - 99 mg/dL 85 100(H) 108(H)  BUN 8 - 27 mg/dL 23 18 26(H)  Creatinine 0.57 - 1.00 mg/dL 1.17(H) 1.42(H) 1.47(H)  Sodium 134 - 144 mmol/L 143 139 138  Potassium 3.5 - 5.2 mmol/L 4.6 4.2 4.4  Chloride 96 - 106 mmol/L 106 104 104  CO2 20 - 29 mmol/L 18(L) 26 23  Calcium 8.7 - 10.3 mg/dL 9.6 9.7 10.0  Total Protein 6.5 - 8.1 g/dL - - 6.5  Total Bilirubin 0.3 - 1.2 mg/dL - - 1.1  Alkaline Phos 38 - 126 U/L - - 99  AST 15 - 41 U/L - -  30  ALT 0 - 44 U/L - - 28    Lab Results  Component Value Date   WBC 8.1 11/17/2018   HGB 11.8 11/17/2018   HCT 36.1 11/17/2018   MCV 85 11/17/2018   PLT 386 11/17/2018   NEUTROABS 7.5 02/03/2017    ASSESSMENT & PLAN:  Malignant neoplasm of central portion of left breast in female, estrogen receptor positive ([place]) 10/27/2017: Left simple mastectomy: Recurrent multifocal IDC grade 2 largest spanning 3.5 cm, 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 Nonspecific pulmonary nodule left upper lobe 3 mm  Oncotype testing could not be performed because it was felt that the tumor  was a recurrent cancer and Oncotype DX has no ability in this situation.   Recommendation: adjuvant antiestrogen therapy with letrozole 2.5 mg dailystarted 11/14/2017 (I offered her systemic chemotherapy but patient declined)  Letrozole toxicities: 1.  Hot flashes and sweats Patient has chronic fatigue because of her cardiac issues.  She is otherwise tolerating letrozole well.  Breast cancer surveillance: 1.  Mammogram scheduled for 08/27/2019 2. breast exam 08/14/2018: Benign  Left posterior chest wall nodule: Patient thinks its a nodule in the back which is causing pain.  I cannot feel any lumps or nodules there.  We will get a CT chest with contrast for further evaluation. I will call her with the result of this test.  Return to clinic in 1 year for follow-up    No orders of the defined types were placed in this encounter.  The patient has a good understanding of the overall plan. she agrees with it. she will call with any problems that may develop before the next visit here.  Nicholas Lose, MD 08/15/2019  Julious Oka Dorshimer am acting as scribe for Dr. Nicholas Lose.  I have reviewed the above documentation for accuracy and completeness, and I agree with the above.

## 2019-08-15 ENCOUNTER — Other Ambulatory Visit: Payer: Self-pay

## 2019-08-15 ENCOUNTER — Inpatient Hospital Stay: Payer: Medicare Other | Attending: Hematology and Oncology | Admitting: Hematology and Oncology

## 2019-08-15 VITALS — BP 124/53 | HR 63 | Temp 98.0°F | Resp 17 | Ht 66.0 in | Wt 223.1 lb

## 2019-08-15 DIAGNOSIS — Z79899 Other long term (current) drug therapy: Secondary | ICD-10-CM | POA: Insufficient documentation

## 2019-08-15 DIAGNOSIS — Z7901 Long term (current) use of anticoagulants: Secondary | ICD-10-CM | POA: Insufficient documentation

## 2019-08-15 DIAGNOSIS — R232 Flushing: Secondary | ICD-10-CM | POA: Insufficient documentation

## 2019-08-15 DIAGNOSIS — C50112 Malignant neoplasm of central portion of left female breast: Secondary | ICD-10-CM | POA: Diagnosis not present

## 2019-08-15 DIAGNOSIS — Z170 Estrogen receptor positive status [ER+]: Secondary | ICD-10-CM | POA: Diagnosis not present

## 2019-08-15 DIAGNOSIS — R911 Solitary pulmonary nodule: Secondary | ICD-10-CM | POA: Insufficient documentation

## 2019-08-15 DIAGNOSIS — Z79811 Long term (current) use of aromatase inhibitors: Secondary | ICD-10-CM | POA: Diagnosis not present

## 2019-08-15 DIAGNOSIS — R079 Chest pain, unspecified: Secondary | ICD-10-CM | POA: Diagnosis not present

## 2019-08-15 DIAGNOSIS — Z7984 Long term (current) use of oral hypoglycemic drugs: Secondary | ICD-10-CM | POA: Diagnosis not present

## 2019-08-15 DIAGNOSIS — R222 Localized swelling, mass and lump, trunk: Secondary | ICD-10-CM

## 2019-08-15 NOTE — Assessment & Plan Note (Signed)
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 Nonspecific pulmonary nodule left upper lobe 3 mm  Oncotype testing could not be performed because it was felt that the tumor was a recurrent cancer and Oncotype DX has no ability in this situation.   Recommendation: adjuvant antiestrogen therapy with letrozole 2.5 mg dailystarted 11/14/2017 (I offered her systemic chemotherapy but patient declined)  Letrozole toxicities: 1.  Hot flashes and sweats Patient has chronic fatigue because of her cardiac issues.  She is otherwise tolerating letrozole well.  Breast cancer surveillance: 1.  Mammogram scheduled for 08/27/2019 2. breast exam 08/14/2018: Benign  Return to clinic in 1 year for follow-up

## 2019-08-16 ENCOUNTER — Telehealth: Payer: Self-pay | Admitting: Hematology and Oncology

## 2019-08-16 ENCOUNTER — Ambulatory Visit (HOSPITAL_COMMUNITY)
Admission: RE | Admit: 2019-08-16 | Discharge: 2019-08-16 | Disposition: A | Discharge status: Home / Self Care | Payer: Medicare Other | Source: Ambulatory Visit | Attending: Hematology and Oncology | Admitting: Hematology and Oncology

## 2019-08-16 ENCOUNTER — Other Ambulatory Visit: Payer: Self-pay

## 2019-08-16 DIAGNOSIS — Z170 Estrogen receptor positive status [ER+]: Secondary | ICD-10-CM

## 2019-08-16 DIAGNOSIS — C50112 Malignant neoplasm of central portion of left female breast: Secondary | ICD-10-CM

## 2019-08-16 DIAGNOSIS — R222 Localized swelling, mass and lump, trunk: Secondary | ICD-10-CM | POA: Diagnosis not present

## 2019-08-16 LAB — POCT I-STAT CREATININE: Creatinine, Ser: 1.2 mg/dL — ABNORMAL HIGH (ref 0.44–1.00)

## 2019-08-16 MED ORDER — SODIUM CHLORIDE (PF) 0.9 % IJ SOLN
INTRAMUSCULAR | Status: AC
  Filled 2019-08-16: qty 50

## 2019-08-16 MED ORDER — IOHEXOL 300 MG/ML  SOLN
75.0000 mL | Freq: Once | INTRAMUSCULAR | Status: AC | PRN
  Administered 2019-08-16: 75 mL via INTRAVENOUS

## 2019-08-16 NOTE — Telephone Encounter (Signed)
I informed the patient with CT chest did not reveal any abnormalities of significance.

## 2019-08-17 ENCOUNTER — Telehealth: Payer: Self-pay | Admitting: Hematology and Oncology

## 2019-08-17 NOTE — Telephone Encounter (Signed)
I talk with patient regarding schedule  

## 2019-08-23 ENCOUNTER — Ambulatory Visit: Payer: Medicare Other | Admitting: Podiatry

## 2019-08-27 ENCOUNTER — Other Ambulatory Visit: Payer: Self-pay

## 2019-08-27 ENCOUNTER — Ambulatory Visit
Admission: RE | Admit: 2019-08-27 | Discharge: 2019-08-27 | Disposition: A | Discharge status: Home / Self Care | Payer: Medicare Other | Source: Ambulatory Visit | Attending: Hematology and Oncology | Admitting: Hematology and Oncology

## 2019-08-27 DIAGNOSIS — Z1231 Encounter for screening mammogram for malignant neoplasm of breast: Secondary | ICD-10-CM

## 2019-08-29 ENCOUNTER — Other Ambulatory Visit: Payer: Self-pay | Admitting: Hematology and Oncology

## 2019-08-29 DIAGNOSIS — R928 Other abnormal and inconclusive findings on diagnostic imaging of breast: Secondary | ICD-10-CM

## 2019-08-31 ENCOUNTER — Ambulatory Visit
Admission: RE | Admit: 2019-08-31 | Discharge: 2019-08-31 | Disposition: A | Discharge status: Home / Self Care | Payer: Medicare Other | Source: Ambulatory Visit | Attending: Hematology and Oncology | Admitting: Hematology and Oncology

## 2019-08-31 ENCOUNTER — Other Ambulatory Visit: Payer: Self-pay

## 2019-08-31 ENCOUNTER — Other Ambulatory Visit: Payer: Self-pay | Admitting: Hematology and Oncology

## 2019-08-31 DIAGNOSIS — R928 Other abnormal and inconclusive findings on diagnostic imaging of breast: Secondary | ICD-10-CM

## 2019-08-31 DIAGNOSIS — N6310 Unspecified lump in the right breast, unspecified quadrant: Secondary | ICD-10-CM

## 2019-09-01 ENCOUNTER — Ambulatory Visit: Payer: Medicare Other | Admitting: Podiatry

## 2019-09-01 DIAGNOSIS — G5763 Lesion of plantar nerve, bilateral lower limbs: Secondary | ICD-10-CM | POA: Diagnosis not present

## 2019-09-01 DIAGNOSIS — E1142 Type 2 diabetes mellitus with diabetic polyneuropathy: Secondary | ICD-10-CM | POA: Diagnosis not present

## 2019-09-01 DIAGNOSIS — B353 Tinea pedis: Secondary | ICD-10-CM

## 2019-09-01 MED ORDER — FLUCONAZOLE 150 MG PO TABS: 150.0000 mg | ORAL_TABLET | ORAL | 2 refills | Status: DC

## 2019-09-01 MED ORDER — GABAPENTIN 300 MG PO CAPS: 300.0000 mg | ORAL_CAPSULE | Freq: Every day | ORAL | 0 refills | Status: DC

## 2019-09-01 NOTE — Progress Notes (Signed)
  Subjective:  Patient ID: Cynthia Herrera, female    DOB: [birth year],  MRN: [id number]  Chief Complaint  Patient presents with  . Neuroma    F/U BL neuroma Pt. states," the same, big toes are very numbn and both feet been burning a lot at night time; 8/10 occasional burning." -pt denies swelling/redness Tx: none -pt denies N/V/F/Ch    73 y.o. female returns for the above complaint.  History above confirmed with patient. Still having a lot of burning at night especially in the big toes.  Objective:  There were no vitals filed for this visit. General AA&O x3. Normal mood and affect.  Vascular Pedal pulses palpable.  Neurologic Epicritic sensation grossly intact.  Dermatologic Skin intact  Orthopedic: POP 3rd interspace bilat.   Assessment & Plan:  Patient was evaluated and treated and all questions answered.  Interdigital Neuroma, bilaterally -Sclerosing injections #4 as below  Procedure: Neurolysis Location: Bilateral 3rd interspace Skin Prep: Alcohol. Injectate: 4% alcohol sclerosing injection. Disposition: Patient tolerated procedure well. Injection site dressed with a band-aid.  Diabetic Peripheral Neuropathy -Rx Gabapentin qHS.  Tinea Pedis -Refill Fluconazole  No follow-ups on file.

## 2019-09-05 ENCOUNTER — Ambulatory Visit
Admission: RE | Admit: 2019-09-05 | Discharge: 2019-09-05 | Disposition: A | Discharge status: Home / Self Care | Payer: Medicare Other | Source: Ambulatory Visit | Attending: Hematology and Oncology | Admitting: Hematology and Oncology

## 2019-09-05 ENCOUNTER — Other Ambulatory Visit: Payer: Self-pay | Admitting: Hematology and Oncology

## 2019-09-05 ENCOUNTER — Other Ambulatory Visit: Payer: Self-pay

## 2019-09-05 DIAGNOSIS — N6310 Unspecified lump in the right breast, unspecified quadrant: Secondary | ICD-10-CM

## 2019-09-13 ENCOUNTER — Ambulatory Visit: Payer: Medicare Other | Admitting: Podiatry

## 2019-09-13 ENCOUNTER — Other Ambulatory Visit: Payer: Self-pay

## 2019-09-13 DIAGNOSIS — G5753 Tarsal tunnel syndrome, bilateral lower limbs: Secondary | ICD-10-CM | POA: Diagnosis not present

## 2019-09-13 DIAGNOSIS — G5763 Lesion of plantar nerve, bilateral lower limbs: Secondary | ICD-10-CM

## 2019-09-17 ENCOUNTER — Ambulatory Visit (HOSPITAL_COMMUNITY)
Admission: RE | Admit: 2019-09-17 | Discharge: 2019-09-17 | Disposition: A | Discharge status: Home / Self Care | Payer: Medicare Other | Source: Ambulatory Visit | Attending: Nurse Practitioner | Admitting: Nurse Practitioner

## 2019-09-17 ENCOUNTER — Other Ambulatory Visit: Payer: Self-pay

## 2019-09-17 ENCOUNTER — Encounter (HOSPITAL_COMMUNITY): Payer: Self-pay | Admitting: Nurse Practitioner

## 2019-09-17 VITALS — BP 114/60 | HR 65 | Ht 66.0 in | Wt 225.6 lb

## 2019-09-17 DIAGNOSIS — Z961 Presence of intraocular lens: Secondary | ICD-10-CM | POA: Insufficient documentation

## 2019-09-17 DIAGNOSIS — Z888 Allergy status to other drugs, medicaments and biological substances status: Secondary | ICD-10-CM | POA: Diagnosis not present

## 2019-09-17 DIAGNOSIS — Z9842 Cataract extraction status, left eye: Secondary | ICD-10-CM | POA: Insufficient documentation

## 2019-09-17 DIAGNOSIS — Z86010 Personal history of colonic polyps: Secondary | ICD-10-CM | POA: Diagnosis not present

## 2019-09-17 DIAGNOSIS — Z8249 Family history of ischemic heart disease and other diseases of the circulatory system: Secondary | ICD-10-CM | POA: Insufficient documentation

## 2019-09-17 DIAGNOSIS — Z885 Allergy status to narcotic agent status: Secondary | ICD-10-CM | POA: Insufficient documentation

## 2019-09-17 DIAGNOSIS — Z96653 Presence of artificial knee joint, bilateral: Secondary | ICD-10-CM | POA: Insufficient documentation

## 2019-09-17 DIAGNOSIS — Z923 Personal history of irradiation: Secondary | ICD-10-CM | POA: Diagnosis not present

## 2019-09-17 DIAGNOSIS — Z833 Family history of diabetes mellitus: Secondary | ICD-10-CM | POA: Diagnosis not present

## 2019-09-17 DIAGNOSIS — I4891 Unspecified atrial fibrillation: Secondary | ICD-10-CM | POA: Diagnosis present

## 2019-09-17 DIAGNOSIS — Z8379 Family history of other diseases of the digestive system: Secondary | ICD-10-CM | POA: Diagnosis not present

## 2019-09-17 DIAGNOSIS — I5022 Chronic systolic (congestive) heart failure: Secondary | ICD-10-CM | POA: Insufficient documentation

## 2019-09-17 DIAGNOSIS — C50912 Malignant neoplasm of unspecified site of left female breast: Secondary | ICD-10-CM | POA: Insufficient documentation

## 2019-09-17 DIAGNOSIS — M1990 Unspecified osteoarthritis, unspecified site: Secondary | ICD-10-CM | POA: Diagnosis not present

## 2019-09-17 DIAGNOSIS — J449 Chronic obstructive pulmonary disease, unspecified: Secondary | ICD-10-CM | POA: Insufficient documentation

## 2019-09-17 DIAGNOSIS — Z9841 Cataract extraction status, right eye: Secondary | ICD-10-CM | POA: Insufficient documentation

## 2019-09-17 DIAGNOSIS — E119 Type 2 diabetes mellitus without complications: Secondary | ICD-10-CM | POA: Diagnosis not present

## 2019-09-17 DIAGNOSIS — D6869 Other thrombophilia: Secondary | ICD-10-CM | POA: Diagnosis not present

## 2019-09-17 DIAGNOSIS — E785 Hyperlipidemia, unspecified: Secondary | ICD-10-CM | POA: Diagnosis not present

## 2019-09-17 DIAGNOSIS — Z79899 Other long term (current) drug therapy: Secondary | ICD-10-CM | POA: Diagnosis not present

## 2019-09-17 DIAGNOSIS — Z7901 Long term (current) use of anticoagulants: Secondary | ICD-10-CM | POA: Insufficient documentation

## 2019-09-17 DIAGNOSIS — Z9221 Personal history of antineoplastic chemotherapy: Secondary | ICD-10-CM | POA: Diagnosis not present

## 2019-09-17 DIAGNOSIS — K219 Gastro-esophageal reflux disease without esophagitis: Secondary | ICD-10-CM | POA: Diagnosis not present

## 2019-09-17 DIAGNOSIS — Z87891 Personal history of nicotine dependence: Secondary | ICD-10-CM | POA: Insufficient documentation

## 2019-09-17 DIAGNOSIS — Z800 Family history of malignant neoplasm of digestive organs: Secondary | ICD-10-CM | POA: Insufficient documentation

## 2019-09-17 DIAGNOSIS — Z79811 Long term (current) use of aromatase inhibitors: Secondary | ICD-10-CM | POA: Insufficient documentation

## 2019-09-17 DIAGNOSIS — Z9012 Acquired absence of left breast and nipple: Secondary | ICD-10-CM | POA: Insufficient documentation

## 2019-09-17 DIAGNOSIS — I4819 Other persistent atrial fibrillation: Secondary | ICD-10-CM | POA: Diagnosis not present

## 2019-09-17 DIAGNOSIS — Z7984 Long term (current) use of oral hypoglycemic drugs: Secondary | ICD-10-CM | POA: Diagnosis not present

## 2019-09-17 DIAGNOSIS — I110 Hypertensive heart disease with heart failure: Secondary | ICD-10-CM | POA: Insufficient documentation

## 2019-09-17 DIAGNOSIS — G4733 Obstructive sleep apnea (adult) (pediatric): Secondary | ICD-10-CM | POA: Insufficient documentation

## 2019-09-17 NOTE — Progress Notes (Signed)
Primary Care Physician: Isaac Bliss, Rayford Halsted, MD Referring Physician: Hansel Starling   Cynthia Herrera is a 73 y.o. female with a h/o afib for f/u in afib clinic, now off amiodarone and is staying in [place].  She is not having any issues with xarelto for a CHA2DS2VASc score of at least 5. Dr. Rayann Heman had requested pt be scheduled for an echo on this visit for a previous EF of 45-50%.   Today, she denies symptoms of palpitations, chest pain, shortness of breath, orthopnea, PND, lower extremity edema, dizziness, presyncope, syncope, or neurologic sequela. The patient is tolerating medications without difficulties and is otherwise without complaint today.   Past Medical History:  Diagnosis Date  . Arthritis   . Breast cancer, left breast ([place]) 2002   "then treated w/chemo and radiation"  . Breast cancer, left breast ([place]) 07/14/2017   "tx'd w/mastectomy"  . Chronic back pain    h/o lumbar stenosis; "no problem since my OR" (`10/27/2017)  . Chronic systolic CHF (congestive heart failure) ([place])   . Complication of anesthesia    last surgery iv med when going to sleep"burned as injected"  . COPD (chronic obstructive pulmonary disease) ([facility])    no inhalers   . DCM (dilated cardiomyopathy) ([place])    EF 15-20% ? tachycardia induced - EF 35-40% by echo 05/2018  . Dyspnea    with exertion  . Gallstones   . GERD (gastroesophageal reflux disease)    once in a while;depends on what she eats  . History of bronchitis    "chronic when I smoked; no problem since I quit in 2002" (10/27/2017)  . Hyperlipidemia   . Hypertension    takes Losartan and Metoprolol.   . Joint pain   . Muscle spasm    takes Flexeril daily as needed   . Nocturia   . OSA (obstructive sleep apnea) 08/22/2018   Severe obstructive sleep apnea with an AHI 47.3/h and no significant central sleep apnea with moderate oxygen desaturations as low as 79%  . Osteopenia   . Persistent atrial fibrillation ([place])    s/p TEE DCCV and  repeat DCCV 11/14/2013 and 02/2018  . Personal history of colonic polyps    adenomas 03 and 08  . Pneumonia    hx  . Seasonal allergies   . Thyroid nodule   . Type II diabetes mellitus ([place])   . Vitamin D deficiency    takes Vit D   Past Surgical History:  Procedure Laterality Date  . ATRIAL FIBRILLATION ABLATION N/A 12/12/2018   Procedure: ATRIAL FIBRILLATION ABLATION;  Surgeon: Thompson Grayer, MD;  Location: [facility];  Service: Cardiovascular;  Laterality: N/A;  . AXILLARY LYMPH NODE DISSECTION Left 10/2001   persistent intramammary node Archie Endo 03/09/2011  . BACK SURGERY    . BREAST BIOPSY Left 2002  . BREAST BIOPSY Right 06/2017   "node bx was negative"  . BREAST BIOPSY Left 06/2017   "positive for cancer"  . BREAST LUMPECTOMY WITH RADIOACTIVE SEED LOCALIZATION Right 08/15/2017   Procedure: RIGHT BREAST LUMPECTOMY WITH RADIOACTIVE SEED LOCALIZATION;  Surgeon: Jovita Kussmaul, MD;  Location: [facility];  Service: General;  Laterality: Right;  . CARDIAC CATHETERIZATION  2015  . CARDIOVERSION N/A 11/12/2013   Procedure: CARDIOVERSION;  Surgeon: Thayer Headings, MD;  Location: [facility] ENDOSCOPY;  Service: Cardiovascular;  Laterality: N/A;  10:08  Dr. Marissa Nestle, anesthesia present, [place]   60mg ,  propofol 50mg , IV for elective cardioversion....Dr. Cathie Olden delievered synch 120 joules with  successful cardioversion to NSR  . CARDIOVERSION N/A 11/12/2013   Procedure: CARDIOVERSION;  Surgeon: Thayer Headings, MD;  Location: [facility];  Service: Cardiovascular;  Laterality: N/A;  . CARDIOVERSION N/A 11/14/2013   Procedure: CARDIOVERSION (BEDSIDE);  Surgeon: Larey Dresser, MD;  Location: [facility];  Service: Cardiovascular;  Laterality: N/A;  . CARDIOVERSION N/A 02/22/2018   Procedure: CARDIOVERSION;  Surgeon: Sueanne Margarita, MD;  Location: [facility] ENDOSCOPY;  Service: Cardiovascular;  Laterality: N/A;  . CATARACT EXTRACTION W/ INTRAOCULAR LENS  IMPLANT, BILATERAL Bilateral ~ 2016  . COLONOSCOPY  2003,  September 2008, April 01, 2011   adenomas 03 and 08, polyp 12, diverticulosis  . COLONOSCOPY WITH PROPOFOL N/A 11/16/2016   Procedure: COLONOSCOPY WITH PROPOFOL;  Surgeon: Gatha Mayer, MD;  Location: [facility] ENDOSCOPY;  Service: Endoscopy;  Laterality: N/A;  . JOINT REPLACEMENT    . LAPAROSCOPIC CHOLECYSTECTOMY  2004  . LEFT AND RIGHT HEART CATHETERIZATION WITH CORONARY ANGIOGRAM N/A 02/01/2014   Procedure: LEFT AND RIGHT HEART CATHETERIZATION WITH CORONARY ANGIOGRAM;  Surgeon: Sueanne Margarita, MD;  Location: [facility] CATH LAB;  Service: Cardiovascular;  Laterality: N/A;  . LUMBAR LAMINECTOMY/DECOMPRESSION MICRODISCECTOMY N/A 03/19/2014   Procedure: LUMBAR LAMINECTOMY/DECOMPRESSION MICRODISCECTOMY 4 LEVEL;  Surgeon: Kristeen Miss, MD;  Location: [facility] NEURO ORS;  Service: Neurosurgery;  Laterality: N/A;  L1-2 L2-3 L3-4 L4-5 Laminectomy/Foraminotomy  . MASS EXCISION Left 08/15/2017   Procedure: EXCISION OF LEFT BREAST MASS;  Surgeon: Jovita Kussmaul, MD;  Location: [facility];  Service: General;  Laterality: Left;  Marland Kitchen MASTECTOMY Left 10/27/2017  . MASTECTOMY PARTIAL / LUMPECTOMY W/ AXILLARY LYMPHADENECTOMY  05/08/2001   Archie Endo 03/09/2011  . PORT-A-CATH REMOVAL  2003  . PORTA CATH INSERTION  2002  . TEE WITHOUT CARDIOVERSION N/A 11/12/2013   Procedure: TRANSESOPHAGEAL ECHOCARDIOGRAM (TEE);  Surgeon: Thayer Headings, MD;  Location: [facility];  Service: Cardiovascular;  Laterality: N/A;  pt b/p low, pt buccal membranes very dry, lips scapped, pt c/o thirst. NPO since MN and iv FLUIDS TOTAL INFUSING AT TOTAL 20ML HR....  Dr. Cathie Olden order allow NS to bolus during procedure....very dry,NS bolus 250 ml total..pt responding well to meds..  . TOTAL KNEE ARTHROPLASTY Left 2006  . TOTAL KNEE ARTHROPLASTY Right 05/10/2016   Procedure: RIGHT TOTAL KNEE ARTHROPLASTY;  Surgeon: Gaynelle Arabian, MD;  Location: [facility] ORS;  Service: Orthopedics;  Laterality: Right;  With adductor block  . TOTAL MASTECTOMY Left 10/27/2017   Procedure: LEFT  MASTECTOMY;  Surgeon: Jovita Kussmaul, MD;  Location: [facility];  Service: General;  Laterality: Left;  . TUBAL LIGATION      Current Outpatient Medications  Medication Sig Dispense Refill  . amoxicillin (AMOXIL) 500 MG capsule Take 2,000 mg by mouth See admin instructions. Take 2000 mg by mouth 1 hour prior to dental appointment  5  . atorvastatin (LIPITOR) 40 MG tablet TAKE 1 TABLET BY MOUTH EVERYDAY AT BEDTIME 90 tablet 3  . BYSTOLIC 2.5 MG tablet TAKE 1 TABLET BY MOUTH EVERY DAY 90 tablet 1  . Cholecalciferol (VITAMIN D3) 2000 units capsule Take 2,000 Units by mouth daily.    . clotrimazole-betamethasone (LOTRISONE) cream Apply 1 application topically 2 (two) times daily. (Patient taking differently: Apply 1 application topically 2 (two) times daily as needed (for feet). ) 30 g 0  . fexofenadine (ALLEGRA) 180 MG tablet Take 180 mg by mouth daily as needed for allergies or rhinitis.    . fluconazole (DIFLUCAN) 150 MG tablet Take 1 tablet (150 mg total) by mouth once  a week. 6 tablet 2  . fluticasone (FLONASE) 50 MCG/ACT nasal spray Place 1 spray into both nostrils daily as needed for allergies.     . furosemide (LASIX) 20 MG tablet TAKE 1 TABLET BY MOUTH TWICE A DAY 180 tablet 2  . gabapentin (NEURONTIN) 300 MG capsule Take 1 capsule (300 mg total) by mouth at bedtime. 30 capsule 0  . glucose blood test strip Use to check CBG AC and QHS. 200 each 12  . KLOR-CON M20 20 MEQ tablet Take 1 tablet (20 mEq total) by mouth daily. 90 tablet 3  . letrozole (FEMARA) 2.5 MG tablet TAKE 1 TABLET BY MOUTH EVERY DAY 90 tablet 3  . losartan (COZAAR) 25 MG tablet TAKE 1 TABLET BY MOUTH TWICE A DAY 180 tablet 2  . metFORMIN (GLUCOPHAGE) 500 MG tablet Take 1 tablet (500 mg total) by mouth daily with breakfast. 180 tablet 1  . pantoprazole (PROTONIX) 40 MG tablet TAKE 1 TABLET BY MOUTH EVERY DAY 90 tablet 3  . spironolactone (ALDACTONE) 25 MG tablet TAKE 1 TABLET BY MOUTH EVERY DAY 90 tablet 2  . traMADol (ULTRAM)  50 MG tablet Take 50 mg by mouth every 6 (six) hours as needed for moderate pain.    Marland Kitchen XARELTO 20 MG TABS tablet TAKE 1 TABLET BY MOUTH EVERY DAY WITH SUPPER 90 tablet 1   No current facility-administered medications for this encounter.     Allergies  Allergen Reactions  . Tikosyn [Dofetilide]     Prolonged QT/QTc  . Zolpidem Other (See Comments)    Hallucinations, up walking around ? Hallucinations, up walking around  . Codeine Other (See Comments)  . Codeine Phosphate Nausea And Vomiting  . Methylprednisolone Other (See Comments)    Felt really weird w the high dose oral steroid, but tolerates low doses or oral steroid Felt really weird w the high dose oral steroid, but tolerates low doses or oral steroid    Social History   Socioeconomic History  . Marital status: Widowed    Spouse name: Not on file  . Number of children: 2  . Years of education: Not on file  . Highest education level: Not on file  Occupational History  . Occupation: retired  Scientific laboratory technician  . Financial resource strain: Not on file  . Food insecurity    Worry: Not on file    Inability: Not on file  . Transportation needs    Medical: Not on file    Non-medical: Not on file  Tobacco Use  . Smoking status: Former Smoker    Packs/day: 1.50    Years: 35.00    Pack years: 52.50    Types: Cigarettes    Quit date: 08/24/2001    Years since quitting: 18.0  . Smokeless tobacco: Never Used  Substance and Sexual Activity  . Alcohol use: No  . Drug use: No  . Sexual activity: Never    Birth control/protection: Post-menopausal  Lifestyle  . Physical activity    Days per week: Not on file    Minutes per session: Not on file  . Stress: Not on file  Relationships  . Social Herbalist on phone: Not on file    Gets together: Not on file    Attends religious service: Not on file    Active member of club or organization: Not on file    Attends meetings of clubs or organizations: Not on file     Relationship status: Not on file  .  Intimate partner violence    Fear of current or ex partner: Not on file    Emotionally abused: Not on file    Physically abused: Not on file    Forced sexual activity: Not on file  Other Topics Concern  . Not on file  Social History Narrative  . Not on file    Family History  Problem Relation Age of Onset  . Dementia Sister   . Diabetes Sister   . Alzheimer's disease Sister   . Diabetes Brother        x 2  . Heart disease Brother   . Irritable bowel syndrome Brother   . Liver cancer Mother   . Diabetes Mother   . Pancreatic cancer Mother   . Diabetes Father   . Heart disease Father     ROS- All systems are reviewed and negative except as per the HPI above  Physical Exam: Vitals:   09/17/19 1402  BP: 114/60  Pulse: 65  Weight: 102.3 kg  Height: 5\' 6"  (1.676 m)   Wt Readings from Last 3 Encounters:  09/17/19 102.3 kg  08/15/19 101.2 kg  07/18/19 102.6 kg    Labs: Lab Results  Component Value Date   NA 143 11/17/2018   K 4.6 11/17/2018   CL 106 11/17/2018   CO2 18 (L) 11/17/2018   GLUCOSE 85 11/17/2018   BUN 23 11/17/2018   CREATININE 1.20 (H) 08/16/2019   CALCIUM 9.6 11/17/2018   MG 2.1 03/26/2018   Lab Results  Component Value Date   INR 1.10 01/02/2018   Lab Results  Component Value Date   CHOL 121 03/03/2018   HDL 39 (L) 03/03/2018   LDLCALC 56 03/03/2018   TRIG 128 03/03/2018     GEN- The patient is well appearing, alert and oriented x 3 today.   Head- normocephalic, atraumatic Eyes-  Sclera clear, conjunctiva pink Ears- hearing intact Oropharynx- clear Neck- supple, no JVP Lymph- no cervical lymphadenopathy Lungs- Clear to ausculation bilaterally, normal work of breathing Heart-  regular rate and rhythm, no murmurs, rubs or gallops, PMI not laterally displaced GI- soft, NT, ND, + BS Extremities- no clubbing, cyanosis, or edema MS- no significant deformity or atrophy Skin- no rash or lesion Psych-  euthymic mood, full affect Neuro- strength and sensation are intact  EKG- NSR at 65  bpm, pr int 152 ms, qrs int 88 ms, qtc 461 ms Epic records reviewed    Assessment and Plan: 1. Afib S/p ablation 12/12/18  Appears to be staying in [place] Now off amiodarone, ILD was found on chest CT   2. CHA2DS2VASc of at least 5 Continue xarelto  20 mg daily  3. LV dysfunction   Echo ordered   4. Bradycardia Resolved   afib clinic in 6 months   Geroge Baseman. Unika Nazareno, [facility] [address] [place], [place] [address] [phone]

## 2019-09-24 ENCOUNTER — Telehealth: Payer: Self-pay | Admitting: Primary Care

## 2019-09-24 ENCOUNTER — Telehealth: Payer: Self-pay

## 2019-09-24 ENCOUNTER — Encounter (HOSPITAL_COMMUNITY): Payer: Self-pay | Admitting: Nurse Practitioner

## 2019-09-24 NOTE — Telephone Encounter (Signed)
Beth ordered a HRCT for pt on 04/19/19 & I got this scheduled for her for 12/9 @ 11:30.  Pt would like to know if this is still needed as she completed a CT for Dr. Lindi Adie on 10/22.

## 2019-09-24 NOTE — Telephone Encounter (Signed)
LMTCB, she does need this as its a HRCT vs the CT w/o contrast in October.

## 2019-09-24 NOTE — Telephone Encounter (Signed)
error 

## 2019-09-24 NOTE — Telephone Encounter (Signed)
Left detailed message on pt's VM to let her know that she will need to keep the Ct appt on 12/9.

## 2019-09-24 NOTE — Addendum Note (Signed)
Encounter addended by: Sherran Needs, NP on: 09/24/2019 2:02 PM  Actions taken: Medication List reviewed, Problem List reviewed, Allergies reviewed, Level of Service modified

## 2019-09-25 ENCOUNTER — Other Ambulatory Visit: Payer: Self-pay

## 2019-09-25 ENCOUNTER — Ambulatory Visit (HOSPITAL_COMMUNITY)
Admission: RE | Admit: 2019-09-25 | Discharge: 2019-09-25 | Disposition: A | Discharge status: Home / Self Care | Payer: Medicare Other | Source: Ambulatory Visit | Attending: Nurse Practitioner | Admitting: Nurse Practitioner

## 2019-09-25 DIAGNOSIS — I110 Hypertensive heart disease with heart failure: Secondary | ICD-10-CM | POA: Diagnosis not present

## 2019-09-25 DIAGNOSIS — I4819 Other persistent atrial fibrillation: Secondary | ICD-10-CM | POA: Insufficient documentation

## 2019-09-25 DIAGNOSIS — E119 Type 2 diabetes mellitus without complications: Secondary | ICD-10-CM | POA: Diagnosis not present

## 2019-09-25 DIAGNOSIS — E785 Hyperlipidemia, unspecified: Secondary | ICD-10-CM | POA: Insufficient documentation

## 2019-09-25 DIAGNOSIS — I429 Cardiomyopathy, unspecified: Secondary | ICD-10-CM | POA: Insufficient documentation

## 2019-09-25 DIAGNOSIS — I509 Heart failure, unspecified: Secondary | ICD-10-CM | POA: Insufficient documentation

## 2019-09-25 DIAGNOSIS — J449 Chronic obstructive pulmonary disease, unspecified: Secondary | ICD-10-CM | POA: Diagnosis not present

## 2019-09-25 NOTE — Progress Notes (Signed)
  Echocardiogram 2D Echocardiogram has been performed. 09/25/2019, 4:05 PM

## 2019-09-28 ENCOUNTER — Ambulatory Visit: Payer: Medicare Other | Admitting: Podiatry

## 2019-09-28 ENCOUNTER — Other Ambulatory Visit: Payer: Self-pay

## 2019-09-28 DIAGNOSIS — G5753 Tarsal tunnel syndrome, bilateral lower limbs: Secondary | ICD-10-CM | POA: Diagnosis not present

## 2019-09-28 DIAGNOSIS — G5763 Lesion of plantar nerve, bilateral lower limbs: Secondary | ICD-10-CM

## 2019-09-28 MED ORDER — GABAPENTIN 300 MG PO CAPS: 300.0000 mg | ORAL_CAPSULE | Freq: Every day | ORAL | 0 refills | Status: DC

## 2019-09-28 NOTE — Progress Notes (Signed)
  Subjective:  Patient ID: Cynthia Herrera, female    DOB: Apr 12, 1946,  MRN: [id number]  Chief Complaint  Patient presents with  . Neuroma    2 week follow up bilateral   73 y.o. female returns for the above complaint.  States the burning in the ball of the foot is better but the burning in the bottom of the foot is the same.  Objective:  There were no vitals filed for this visit. General AA&O x3. Normal mood and affect.  Vascular Pedal pulses palpable.  Neurologic Epicritic sensation grossly intact. Altered sensation plantar foot bilat  Dermatologic Skin intact  Orthopedic: POP 3rd interspace bilat.   Assessment & Plan:  Patient was evaluated and treated and all questions answered.  Interdigital Neuroma, bilaterally -Sclerosing injections #4 as below  Procedure: Neurolysis Location: Bilateral 3rd interspace Skin Prep: Alcohol. Injectate: 4% alcohol sclerosing injection. Disposition: Patient tolerated procedure well. Injection site dressed with a band-aid.   Diabetic Peripheral Neuropathy / ?Tarsal tunnel syndrome -Trial tarsal tunnel injection bilat  Procedure: Tarsal tunnel Injection Location: Bilateral tarsal tunnel Skin Prep: Alcohol. Injectate: 1 cc 1% lidocaine plain, 1cc dexamethasone phosphate. Disposition: Patient tolerated procedure well. Injection site dressed with a band-aid.    Return in about 2 weeks (around 09/27/2019) for neuroma ,neuropathy f/u bilat .

## 2019-09-29 NOTE — Progress Notes (Signed)
  Subjective:  Patient ID: Cynthia Herrera, female    DOB: 06-Aug-1946,  MRN: [id number]  Chief Complaint  Patient presents with  . Neuroma    Pt states injections very effective.  . Peripheral Neuropathy    Pt states gabapentin is very effective.   73 y.o. female returns for the above complaint.  States that the injections were very effective.  Requesting refill of her gabapentin.  States that the injection on the left ankle hurt more because it was closer to the nerve but even with that the relief she experience with equal bilaterally. Objective:  There were no vitals filed for this visit. General AA&O x3. Normal mood and affect.  Vascular Pedal pulses palpable.  Neurologic Epicritic sensation grossly intact. Altered sensation plantar foot bilat  Dermatologic Skin intact  Orthopedic: POP 3rd interspace bilat   Assessment & Plan:  Patient was evaluated and treated and all questions answered.  Interdigital Neuroma, bilaterally -Sclerosing injections #5 as below  Procedure: Neurolysis Location: Bilateral 3rd interspace Skin Prep: Alcohol. Injectate: 4% alcohol sclerosing injection. Disposition: Patient tolerated procedure well. Injection site dressed with a band-aid.   Diabetic Peripheral Neuropathy / ?Tarsal tunnel syndrome -Repeat tarsal tunnel injection bilat  Procedure: Tarsal tunnel Injection Location: Bilateral tarsal tunnel Skin Prep: Alcohol. Injectate: 1 cc 1% lidocaine plain, 1cc dexamethasone phosphate. Disposition: Patient tolerated procedure well. Injection site dressed with a band-aid.    Return in about 2 weeks (around 10/12/2019).

## 2019-10-03 ENCOUNTER — Other Ambulatory Visit: Payer: Self-pay

## 2019-10-03 ENCOUNTER — Ambulatory Visit (INDEPENDENT_AMBULATORY_CARE_PROVIDER_SITE_OTHER)
Admission: RE | Admit: 2019-10-03 | Discharge: 2019-10-03 | Disposition: A | Discharge status: Home / Self Care | Payer: Medicare Other | Source: Ambulatory Visit | Attending: Primary Care | Admitting: Primary Care

## 2019-10-03 DIAGNOSIS — J849 Interstitial pulmonary disease, unspecified: Secondary | ICD-10-CM

## 2019-10-05 NOTE — Progress Notes (Signed)
Please let patient know there was not evidence of fibrotic interstitial lung disease. No evidence of amiodarone toxicity. Other findings of aortic atherosclerosis, continue Lipitor and following with cardiology. Is she on CPAP for sleep apnea?

## 2019-10-09 NOTE — Progress Notes (Signed)
Ok, good. Just wanted to make sure her OSA was being managed. She should continue to follow with Dr. Ron Parker if he recommended.

## 2019-10-11 ENCOUNTER — Other Ambulatory Visit: Payer: Self-pay | Admitting: Podiatry

## 2019-10-11 ENCOUNTER — Other Ambulatory Visit: Payer: Self-pay | Admitting: Hematology and Oncology

## 2019-10-11 ENCOUNTER — Other Ambulatory Visit: Payer: Self-pay | Admitting: Cardiology

## 2019-10-12 ENCOUNTER — Other Ambulatory Visit: Payer: Self-pay

## 2019-10-12 ENCOUNTER — Ambulatory Visit: Payer: Medicare Other | Admitting: Podiatry

## 2019-10-12 DIAGNOSIS — G5763 Lesion of plantar nerve, bilateral lower limbs: Secondary | ICD-10-CM | POA: Diagnosis not present

## 2019-10-12 DIAGNOSIS — G5753 Tarsal tunnel syndrome, bilateral lower limbs: Secondary | ICD-10-CM

## 2019-10-23 ENCOUNTER — Ambulatory Visit: Payer: Medicare Other | Attending: Internal Medicine

## 2019-10-23 DIAGNOSIS — U071 COVID-19: Secondary | ICD-10-CM

## 2019-10-24 LAB — NOVEL CORONAVIRUS, NAA: SARS-CoV-2, NAA: NOT DETECTED

## 2019-10-25 ENCOUNTER — Encounter: Payer: Self-pay | Admitting: Internal Medicine

## 2019-10-28 NOTE — Progress Notes (Signed)
  Subjective:  Patient ID: Cynthia Herrera, female    DOB: 1946-01-31,  MRN: [id number]  Chief Complaint  Patient presents with  . Follow-up    Bilateral feet (plantar), bilateral hallux. Pt stated, "They feel numb, but better".    74 y.o. female returns for the above complaint.  States the injections are helping a lot. Not having ball of the foot pain.  Objective:  There were no vitals filed for this visit. General AA&O x3. Normal mood and affect.  Vascular Pedal pulses palpable.  Neurologic Epicritic sensation grossly intact. Altered sensation plantar foot bilat  Dermatologic Skin intact  Orthopedic: POP 3rd interspace bilat   Assessment & Plan:  Patient was evaluated and treated and all questions answered.  Interdigital Neuroma, bilaterally -Sclerosing injections #6 as below  Procedure: Sclerosing Nerve Injection Location: Bilateral 3rd interspace Skin Prep: Alcohol. Injectate: 1.5 cc 4% sclerosing alcohol injection Disposition: Patient tolerated procedure well. Injection site dressed with a band-aid.  Diabetic Peripheral Neuropathy / ?Tarsal tunnel syndrome -repeat tarsal tunnel injection bilat  Procedure: Tarsal tunnel Injection Location: Bilateral tarsal tunnel Skin Prep: Alcohol. Injectate: 1 cc 1% lidocaine plain, 1cc dexamethasone phosphate. Disposition: Patient tolerated procedure well. Injection site dressed with a band-aid.   Return in about 4 weeks (around 11/09/2019) for Neuroma.

## 2019-11-09 ENCOUNTER — Ambulatory Visit: Payer: Medicare Other | Admitting: Podiatry

## 2019-11-15 ENCOUNTER — Ambulatory Visit (INDEPENDENT_AMBULATORY_CARE_PROVIDER_SITE_OTHER): Payer: Medicare Other | Admitting: Internal Medicine

## 2019-11-15 ENCOUNTER — Other Ambulatory Visit: Payer: Self-pay

## 2019-11-15 ENCOUNTER — Encounter: Payer: Self-pay | Admitting: Internal Medicine

## 2019-11-15 VITALS — BP 118/70 | HR 86 | Temp 97.4°F | Wt 231.1 lb

## 2019-11-15 DIAGNOSIS — G4733 Obstructive sleep apnea (adult) (pediatric): Secondary | ICD-10-CM

## 2019-11-15 DIAGNOSIS — I10 Essential (primary) hypertension: Secondary | ICD-10-CM | POA: Diagnosis not present

## 2019-11-15 DIAGNOSIS — I70209 Unspecified atherosclerosis of native arteries of extremities, unspecified extremity: Secondary | ICD-10-CM | POA: Diagnosis not present

## 2019-11-15 DIAGNOSIS — E1151 Type 2 diabetes mellitus with diabetic peripheral angiopathy without gangrene: Secondary | ICD-10-CM | POA: Diagnosis not present

## 2019-11-15 DIAGNOSIS — E785 Hyperlipidemia, unspecified: Secondary | ICD-10-CM

## 2019-11-15 LAB — POCT GLYCOSYLATED HEMOGLOBIN (HGB A1C): Hemoglobin A1C: 7.4 % — AB (ref 4.0–5.6)

## 2019-11-15 MED ORDER — METFORMIN HCL 500 MG PO TABS: 500.0000 mg | ORAL_TABLET | Freq: Two times a day (BID) | ORAL | 1 refills | Status: DC

## 2019-11-15 NOTE — Patient Instructions (Signed)
-Nice seeing you today!!  -Increase metformin to 500 mg twice daily.  -Schedule 3 month follow up.    We are committed to keeping you informed about the COVID-19 vaccine.  As the vaccine continues to become available for each phase, we will ensure that patients who meet the criteria receive the information they need to access vaccination opportunities. Continue to check your MyChart account and [email or web address] for updates. Please review the Phase 1b information below.  Following [place]'s guidelines for the distribution of COVID-19 vaccines we are pleased to share our plans to begin offering vaccines to those 65 and older (Phase 1b). Here are details of those plans:  [place] On Tuesday, Jan. 19, the [facility]) and [place] begin large-scale COVID-19 vaccinations at the [place]. The vaccinations are appointment only and for those 53 and older.  Walk-ins will not be accepted.  All appointments are currently filled. Please join our waiting list for the next available appointments. We will contact you when appointments become available. Please do not sign up more than once.  Join Our Waiting List   Other Vaccination Opportunities in [place] We are also working in partnership with county health agencies in our service counties to ensure continuing vaccination availability in the weeks and months ahead. Learn more about each county's vaccination efforts in the website links below:   [place]'s phase 1b vaccination guidelines, prioritizing those 65 and over as the next eligible group to receive the COVID-19 vaccine, are detailed at [email or web address].   Vaccine Safety and Effectiveness Clinical trials for the Pfizer COVID-19 vaccine involved 42,000 people and showed that the  vaccine is more than 95% effective in preventing COVID-19 with no serious safety concerns. Similar results have been reported for the Moderna COVID-19 vaccine. Side effects reported in the [place] clinical trials include a sore arm at the injection site, fatigue, headache, chills and fever. While side effects from the [place] COVID-19 vaccine are higher than for a typical flu vaccine, they are lower in many ways than side effects from the leading vaccine to prevent shingles. Side effects are signs that a vaccine is working and are related to your immune system being stimulated to produce antibodies against infection. Side effects from vaccination are far less significant than health impacts from COVID-19.  Staying Informed Pharmacists, infectious disease doctors, critical care nurses and other experts at [facility] continue to speak publicly through media interviews and direct communication with our patients and communities about the safety, effectiveness and importance of vaccines to eliminate COVID-19. In addition, reliable information on vaccine safety, effectiveness, side effects and more is available on the following websites:  [place] Department of Health and Human Services COVID-19 Vaccine Information Website.  U.S. Centers for Disease Control and Prevention [id number] Human resources officer.  Staying Safe We agree with the CDC on what we can do to help our communities get back to normal: Getting "back to normal" is going to take all of our tools. If we use all the tools we have, we stand the best chance of getting our families, communities, schools and workplaces "back to normal" sooner:  Get vaccinated as soon as vaccines become available within the phase of the state's vaccination rollout plan for which you meet the eligibility criteria.  Wear a mask.  Stay 6 feet from others and avoid crowds.  Wash hands often.  For our most current information, please visit  [email or web address].

## 2019-11-15 NOTE — Progress Notes (Signed)
Established Patient Office Visit     This visit occurred during the SARS-CoV-2 public health emergency.  Safety protocols were in place, including screening questions prior to the visit, additional usage of staff PPE, and extensive cleaning of exam room while observing appropriate contact time as indicated for disinfecting solutions.    CC/Reason for Visit: 57-month follow-up chronic medical conditions  HPI: Cynthia Herrera is a 74 y.o. female who is coming in today for the above mentioned reasons. Past Medical History is significant for:  hx of breast cas/p left mastectomy, arthritis,chronic combinedCHF, COPD, hypertension, hyperlipidemia, OSA, osteopenia, thyroid nodule and DM II, also chronic A. fib.  She has no acute complaints today.  She admits to dietary indiscretions during the holiday.  She would like more information about the COVID-19 vaccine.  Past Medical/Surgical History: Past Medical History:  Diagnosis Date  . Arthritis   . Breast cancer, left breast ([place]) 2002   "then treated w/chemo and radiation"  . Breast cancer, left breast ([place]) 07/14/2017   "tx'd w/mastectomy"  . Chronic back pain    h/o lumbar stenosis; "no problem since my OR" (`10/27/2017)  . Chronic systolic CHF (congestive heart failure) ([place])   . Complication of anesthesia    last surgery iv med when going to sleep"burned as injected"  . COPD (chronic obstructive pulmonary disease) (HCC)    no inhalers   . DCM (dilated cardiomyopathy) ([place])    EF 15-20% ? tachycardia induced - EF 35-40% by echo 05/2018  . Dyspnea    with exertion  . Gallstones   . GERD (gastroesophageal reflux disease)    once in a while;depends on what she eats  . History of bronchitis    "chronic when I smoked; no problem since I quit in 2002" (10/27/2017)  . Hyperlipidemia   . Hypertension    takes Losartan and Metoprolol.   . Joint pain   . Muscle spasm    takes Flexeril daily as needed   . Nocturia   . OSA  (obstructive sleep apnea) 08/22/2018   Severe obstructive sleep apnea with an AHI 47.3/h and no significant central sleep apnea with moderate oxygen desaturations as low as 79%  . Osteopenia   . Persistent atrial fibrillation ([place])    s/p TEE DCCV and repeat DCCV 11/14/2013 and 02/2018  . Personal history of colonic polyps    adenomas 03 and 08  . Pneumonia    hx  . Seasonal allergies   . Thyroid nodule   . Type II diabetes mellitus ([place])   . Vitamin D deficiency    takes Vit D    Past Surgical History:  Procedure Laterality Date  . ATRIAL FIBRILLATION ABLATION N/A 12/12/2018   Procedure: ATRIAL FIBRILLATION ABLATION;  Surgeon: Thompson Grayer, MD;  Location: [facility] CV LAB;  Service: Cardiovascular;  Laterality: N/A;  . AXILLARY LYMPH NODE DISSECTION Left 10/2001   persistent intramammary node Archie Endo 03/09/2011  . BACK SURGERY    . BREAST BIOPSY Left 2002  . BREAST BIOPSY Right 06/2017   "node bx was negative"  . BREAST BIOPSY Left 06/2017   "positive for cancer"  . BREAST LUMPECTOMY WITH RADIOACTIVE SEED LOCALIZATION Right 08/15/2017   Procedure: RIGHT BREAST LUMPECTOMY WITH RADIOACTIVE SEED LOCALIZATION;  Surgeon: Jovita Kussmaul, MD;  Location: [facility];  Service: General;  Laterality: Right;  . CARDIAC CATHETERIZATION  2015  . CARDIOVERSION N/A 11/12/2013   Procedure: CARDIOVERSION;  Surgeon: Thayer Headings, MD;  Location: [facility]  ENDOSCOPY;  Service: Cardiovascular;  Laterality: N/A;  10:08  Dr. Marissa Nestle, anesthesia present, [place]   60mg ,  propofol 50mg , IV for elective cardioversion....Dr. Cathie Olden delievered synch 120 joules with successful cardioversion to NSR  . CARDIOVERSION N/A 11/12/2013   Procedure: CARDIOVERSION;  Surgeon: Thayer Headings, MD;  Location: [facility];  Service: Cardiovascular;  Laterality: N/A;  . CARDIOVERSION N/A 11/14/2013   Procedure: CARDIOVERSION (BEDSIDE);  Surgeon: Larey Dresser, MD;  Location: [facility];  Service: Cardiovascular;  Laterality: N/A;  .  CARDIOVERSION N/A 02/22/2018   Procedure: CARDIOVERSION;  Surgeon: Sueanne Margarita, MD;  Location: [facility] ENDOSCOPY;  Service: Cardiovascular;  Laterality: N/A;  . CATARACT EXTRACTION W/ INTRAOCULAR LENS  IMPLANT, BILATERAL Bilateral ~ 2016  . COLONOSCOPY  2003, September 2008, April 01, 2011   adenomas 03 and 08, polyp 12, diverticulosis  . COLONOSCOPY WITH PROPOFOL N/A 11/16/2016   Procedure: COLONOSCOPY WITH PROPOFOL;  Surgeon: Gatha Mayer, MD;  Location: [facility] ENDOSCOPY;  Service: Endoscopy;  Laterality: N/A;  . JOINT REPLACEMENT    . LAPAROSCOPIC CHOLECYSTECTOMY  2004  . LEFT AND RIGHT HEART CATHETERIZATION WITH CORONARY ANGIOGRAM N/A 02/01/2014   Procedure: LEFT AND RIGHT HEART CATHETERIZATION WITH CORONARY ANGIOGRAM;  Surgeon: Sueanne Margarita, MD;  Location: [facility] CATH LAB;  Service: Cardiovascular;  Laterality: N/A;  . LUMBAR LAMINECTOMY/DECOMPRESSION MICRODISCECTOMY N/A 03/19/2014   Procedure: LUMBAR LAMINECTOMY/DECOMPRESSION MICRODISCECTOMY 4 LEVEL;  Surgeon: Kristeen Miss, MD;  Location: [place] [place] NEURO ORS;  Service: Neurosurgery;  Laterality: N/A;  L1-2 L2-3 L3-4 L4-5 Laminectomy/Foraminotomy  . MASS EXCISION Left 08/15/2017   Procedure: EXCISION OF LEFT BREAST MASS;  Surgeon: Jovita Kussmaul, MD;  Location: [facility];  Service: General;  Laterality: Left;  Marland Kitchen MASTECTOMY Left 10/27/2017  . MASTECTOMY PARTIAL / LUMPECTOMY W/ AXILLARY LYMPHADENECTOMY  05/08/2001   Archie Endo 03/09/2011  . PORT-A-CATH REMOVAL  2003  . PORTA CATH INSERTION  2002  . TEE WITHOUT CARDIOVERSION N/A 11/12/2013   Procedure: TRANSESOPHAGEAL ECHOCARDIOGRAM (TEE);  Surgeon: Thayer Headings, MD;  Location: [facility];  Service: Cardiovascular;  Laterality: N/A;  pt b/p low, pt buccal membranes very dry, lips scapped, pt c/o thirst. NPO since MN and iv FLUIDS TOTAL INFUSING AT TOTAL 20ML HR....  Dr. Cathie Olden order allow NS to bolus during procedure....very dry,NS bolus 250 ml total..pt responding well to meds..  . TOTAL KNEE ARTHROPLASTY Left  2006  . TOTAL KNEE ARTHROPLASTY Right 05/10/2016   Procedure: RIGHT TOTAL KNEE ARTHROPLASTY;  Surgeon: Gaynelle Arabian, MD;  Location: [facility] ORS;  Service: Orthopedics;  Laterality: Right;  With adductor block  . TOTAL MASTECTOMY Left 10/27/2017   Procedure: LEFT MASTECTOMY;  Surgeon: Jovita Kussmaul, MD;  Location: [facility];  Service: General;  Laterality: Left;  . TUBAL LIGATION      Social History:  reports that she quit smoking about 18 years ago. Her smoking use included cigarettes. She has a 52.50 pack-year smoking history. She has never used smokeless tobacco. She reports that she does not drink alcohol or use drugs.  Allergies: Allergies  Allergen Reactions  . Tikosyn [Dofetilide]     Prolonged QT/QTc  . Zolpidem Other (See Comments)    Hallucinations, up walking around ? Hallucinations, up walking around  . Codeine Other (See Comments)  . Codeine Phosphate Nausea And Vomiting  . Methylprednisolone Other (See Comments)    Felt really weird w the high dose oral steroid, but tolerates low doses or oral steroid Felt really weird w the high dose oral steroid, but tolerates  low doses or oral steroid    Family History:  Family History  Problem Relation Age of Onset  . Dementia Sister   . Diabetes Sister   . Alzheimer's disease Sister   . Diabetes Brother        x 2  . Heart disease Brother   . Irritable bowel syndrome Brother   . Liver cancer Mother   . Diabetes Mother   . Pancreatic cancer Mother   . Diabetes Father   . Heart disease Father      Current Outpatient Medications:  .  amoxicillin (AMOXIL) 500 MG capsule, Take 2,000 mg by mouth See admin instructions. Take 2000 mg by mouth 1 hour prior to dental appointment, Disp: , Rfl: 5 .  atorvastatin (LIPITOR) 40 MG tablet, TAKE 1 TABLET BY MOUTH EVERYDAY AT BEDTIME, Disp: 90 tablet, Rfl: 3 .  BYSTOLIC 2.5 MG tablet, TAKE 1 TABLET BY MOUTH EVERY DAY, Disp: 90 tablet, Rfl: 1 .  Cholecalciferol (VITAMIN D3) 2000 units capsule,  Take 2,000 Units by mouth daily., Disp: , Rfl:  .  clotrimazole-betamethasone (LOTRISONE) cream, Apply 1 application topically 2 (two) times daily. (Patient taking differently: Apply 1 application topically 2 (two) times daily as needed (for feet). ), Disp: 30 g, Rfl: 0 .  fexofenadine (ALLEGRA) 180 MG tablet, Take 180 mg by mouth daily as needed for allergies or rhinitis., Disp: , Rfl:  .  fluconazole (DIFLUCAN) 150 MG tablet, Take 1 tablet (150 mg total) by mouth once a week., Disp: 6 tablet, Rfl: 2 .  fluticasone (FLONASE) 50 MCG/ACT nasal spray, Place 1 spray into both nostrils daily as needed for allergies. , Disp: , Rfl:  .  furosemide (LASIX) 20 MG tablet, TAKE 1 TABLET BY MOUTH TWICE A DAY, Disp: 180 tablet, Rfl: 2 .  gabapentin (NEURONTIN) 300 MG capsule, TAKE 1 CAPSULE BY MOUTH EVERYDAY AT BEDTIME, Disp: 30 capsule, Rfl: 0 .  glucose blood test strip, Use to check CBG AC and QHS., Disp: 200 each, Rfl: 12 .  KLOR-CON M20 20 MEQ tablet, TAKE 1 TABLET BY MOUTH EVERY DAY, Disp: 90 tablet, Rfl: 2 .  letrozole (FEMARA) 2.5 MG tablet, TAKE 1 TABLET BY MOUTH EVERY DAY, Disp: 90 tablet, Rfl: 3 .  losartan (COZAAR) 25 MG tablet, Take 1 tablet (25 mg total) by mouth 2 (two) times daily. PLEASE MAKE ANNUAL APPT FOR FUTURE REFILLS. [phone], Disp: 180 tablet, Rfl: 0 .  metFORMIN (GLUCOPHAGE) 500 MG tablet, Take 1 tablet (500 mg total) by mouth 2 (two) times daily with a meal., Disp: 180 tablet, Rfl: 1 .  pantoprazole (PROTONIX) 40 MG tablet, TAKE 1 TABLET BY MOUTH EVERY DAY, Disp: 90 tablet, Rfl: 3 .  spironolactone (ALDACTONE) 25 MG tablet, TAKE 1 TABLET BY MOUTH EVERY DAY, Disp: 90 tablet, Rfl: 2 .  traMADol (ULTRAM) 50 MG tablet, Take 50 mg by mouth every 6 (six) hours as needed for moderate pain., Disp: , Rfl:  .  XARELTO 20 MG TABS tablet, TAKE 1 TABLET BY MOUTH EVERY DAY WITH SUPPER, Disp: 90 tablet, Rfl: 1  Review of Systems:  Constitutional: Denies fever, chills, diaphoresis, appetite  change and fatigue.  HEENT: Denies photophobia, eye pain, redness, hearing loss, ear pain, congestion, sore throat, rhinorrhea, sneezing, mouth sores, trouble swallowing, neck pain, neck stiffness and tinnitus.   Respiratory: Denies SOB, DOE, cough, chest tightness,  and wheezing.   Cardiovascular: Denies chest pain, palpitations and leg swelling.  Gastrointestinal: Denies nausea, vomiting, abdominal pain, diarrhea, constipation, blood  in stool and abdominal distention.  Genitourinary: Denies dysuria, urgency, frequency, hematuria, flank pain and difficulty urinating.  Endocrine: Denies: hot or cold intolerance, sweats, changes in hair or nails, polyuria, polydipsia. Musculoskeletal: Denies myalgias, back pain, joint swelling, arthralgias and gait problem.  Skin: Denies pallor, rash and wound.  Neurological: Denies dizziness, seizures, syncope, weakness, light-headedness, numbness and headaches.  Hematological: Denies adenopathy. Easy bruising, personal or family bleeding history  Psychiatric/Behavioral: Denies suicidal ideation, mood changes, confusion, nervousness, sleep disturbance and agitation    Physical Exam: Vitals:   11/15/19 1344  BP: 118/70  Pulse: 86  Temp: (!) 97.4 F (36.3 C)  TempSrc: Temporal  SpO2: 94%  Weight: 231 lb 1.6 oz (104.8 kg)    Body mass index is 37.3 kg/m.   Constitutional: NAD, calm, comfortable Eyes: PERRL, lids and conjunctivae normal ENMT: Mucous membranes are moist.  Respiratory: clear to auscultation bilaterally, no wheezing, no crackles. Normal respiratory effort. No accessory muscle use.  Cardiovascular: Regular rate and rhythm, no murmurs / rubs / gallops. No extremity edema.  Neurologic: Grossly intact and nonfocal Psychiatric: Normal judgment and insight. Alert and oriented x 3. Normal mood.    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 -She will work on lifestyle  modifications, I will increase her Metformin from 500 daily to twice daily and have her follow-up in 3 months with repeat A1c.  Dyslipidemia -Last LDL was 56 in 2019.  Essential hypertension -Well-controlled.  OSA (obstructive sleep apnea) -Well-controlled, followed by pulmonary.   Patient Instructions  -Nice seeing you today!!  -Increase metformin to 500 mg twice daily.  -Schedule 3 month follow up.    We are committed to keeping you informed about the COVID-19 vaccine.  As the vaccine continues to become available for each phase, we will ensure that patients who meet the criteria receive the information they need to access vaccination opportunities. Continue to check your MyChart account and [email or web address] for updates. Please review the Phase 1b information below.  Following [place]'s guidelines for the distribution of COVID-19 vaccines we are pleased to share our plans to begin offering vaccines to those 65 and older (Phase 1b). Here are details of those plans:  [place] On Tuesday, Jan. 19, the [facility]) and [place] begin large-scale COVID-19 vaccinations at the [facility]. The vaccinations are appointment only and for those 57 and older.  Walk-ins will not be accepted.  All appointments are currently filled. Please join our waiting list for the next available appointments. We will contact you when appointments become available. Please do not sign up more than once.  Join Our Waiting List   Other Vaccination Opportunities in Home We are also working in partnership with county health agencies in our service counties to ensure continuing vaccination availability in the weeks and months ahead. Learn more about each county's vaccination efforts in the website links below:   [place]'s phase 1b vaccination guidelines, prioritizing those 65 and over as the next eligible group to receive the COVID-19 vaccine, are detailed at [email or web address].   Vaccine Safety and Effectiveness Clinical trials for the Pfizer COVID-19 vaccine involved 42,000 people and showed that the vaccine is more than 95% effective in preventing COVID-19 with no serious safety concerns. Similar results have been reported for  the Moderna COVID-19 vaccine. Side effects reported in the [place] clinical trials include a sore arm at the injection site, fatigue, headache, chills and fever. While side effects from the [place] COVID-19 vaccine are higher than for a typical flu vaccine, they are lower in many ways than side effects from the leading vaccine to prevent shingles. Side effects are signs that a vaccine is working and are related to your immune system being stimulated to produce antibodies against infection. Side effects from vaccination are far less significant than health impacts from COVID-19.  Staying Informed Pharmacists, infectious disease doctors, critical care nurses and other experts at [facility] continue to speak publicly through media interviews and direct communication with our patients and communities about the safety, effectiveness and importance of vaccines to eliminate COVID-19. In addition, reliable information on vaccine safety, effectiveness, side effects and more is available on the following websites:  [place] Department of Health and Human Services COVID-19 Vaccine Information Website.  [place] Centers for Disease Control and Prevention XX123456 Human resources officer.  Staying Safe We agree with the CDC on what we can do to help our communities get back to normal: Getting "back to normal" is going to take all of our tools. If we use all the tools we have, we stand the best chance of getting our families, communities, schools and workplaces "back to normal"  sooner:  Get vaccinated as soon as vaccines become available within the phase of the state's vaccination rollout plan for which you meet the eligibility criteria.  Wear a mask.  Stay 6 feet from others and avoid crowds.  Wash hands often.  For our most current information, please visit [email or web address].      Lelon Frohlich, MD [facility] at [facility]

## 2019-11-16 ENCOUNTER — Ambulatory Visit (INDEPENDENT_AMBULATORY_CARE_PROVIDER_SITE_OTHER): Payer: Medicare Other | Admitting: Podiatry

## 2019-11-16 DIAGNOSIS — G5763 Lesion of plantar nerve, bilateral lower limbs: Secondary | ICD-10-CM

## 2019-11-16 DIAGNOSIS — G5753 Tarsal tunnel syndrome, bilateral lower limbs: Secondary | ICD-10-CM

## 2019-11-16 NOTE — Progress Notes (Signed)
  Subjective:  Patient ID: Cynthia Herrera, female    DOB: 06-May-1946,  MRN: [id number]  Chief Complaint  Patient presents with  . Neuroma    Follow-up. Pt stated, "I'm doing much better. No pain. I don't think I need more injections".    74 y.o. female presents with the above complaint. History confirmed with patient.   Objective:  Physical Exam: warm, good capillary refill, no trophic changes or ulcerative lesions, normal DP and PT pulses and normal sensory exam. Left Foot: normal exam, no swelling, tenderness, instability; ligaments intact, full range of motion of all ankle/foot joints  Right Foot: normal exam, no swelling, tenderness, instability; ligaments intact, full range of motion of all ankle/foot joints   Assessment:   1. 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 Morton's neuroma of both feet     Plan:  Patient was evaluated and treated and all questions answered.  Morton Neuroma -Resolved, no further injections. -F/u should pain recur.  Tarsal Tunnel Syndrome, Neuropathy -Improved. No injection today.  Return if symptoms worsen or fail to improve.

## 2019-11-28 ENCOUNTER — Other Ambulatory Visit: Payer: Self-pay | Admitting: Podiatry

## 2019-11-29 NOTE — Telephone Encounter (Signed)
Dr. Price please advice 

## 2019-12-06 ENCOUNTER — Other Ambulatory Visit: Payer: Self-pay | Admitting: Internal Medicine

## 2019-12-06 ENCOUNTER — Other Ambulatory Visit: Payer: Self-pay | Admitting: Cardiology

## 2019-12-11 ENCOUNTER — Other Ambulatory Visit: Payer: Self-pay

## 2019-12-11 ENCOUNTER — Encounter: Payer: Self-pay | Admitting: Internal Medicine

## 2019-12-11 ENCOUNTER — Telehealth (INDEPENDENT_AMBULATORY_CARE_PROVIDER_SITE_OTHER): Payer: Medicare Other | Admitting: Internal Medicine

## 2019-12-11 VITALS — Ht 66.0 in | Wt 228.0 lb

## 2019-12-11 DIAGNOSIS — I428 Other cardiomyopathies: Secondary | ICD-10-CM

## 2019-12-11 DIAGNOSIS — I4819 Other persistent atrial fibrillation: Secondary | ICD-10-CM | POA: Diagnosis not present

## 2019-12-11 DIAGNOSIS — I110 Hypertensive heart disease with heart failure: Secondary | ICD-10-CM

## 2019-12-11 DIAGNOSIS — I5022 Chronic systolic (congestive) heart failure: Secondary | ICD-10-CM | POA: Diagnosis not present

## 2019-12-11 DIAGNOSIS — G4733 Obstructive sleep apnea (adult) (pediatric): Secondary | ICD-10-CM

## 2019-12-11 DIAGNOSIS — I10 Essential (primary) hypertension: Secondary | ICD-10-CM

## 2019-12-11 NOTE — Progress Notes (Signed)
Electrophysiology TeleHealth Note  Due to national recommendations of social distancing due to [place] 19, an audio telehealth visit is felt to be most appropriate for this patient at this time.  Verbal consent was obtained by me for the telehealth visit today.  The patient does not have capability for a virtual visit.  A phone visit is therefore required today.   Date:  12/11/2019   ID:  Cynthia Herrera, DOB [birth year], MRN [id number]  Location: patient's home  Provider location:  [facility]  Evaluation Performed: Follow-up visit  PCP:  Isaac Bliss, Rayford Halsted, MD   Electrophysiologist:  Dr Rayann Heman  Chief Complaint:  palpitations  History of Present Illness:    Cynthia Herrera is a 74 y.o. female who presents via telehealth conferencing today.  Since last being seen in our clinic, the patient reports doing very well.  Today, she denies symptoms of palpitations, chest pain, lower extremity edema, dizziness, presyncope, or syncope.  She had palpitations about a few weeks ago with SOB.  She was evaluated by her PCP and was found to be in sinus.  Her palpitations and SOB are resolved.  She does find that with rigorous activity that she SOB.  Her primary concern is with back pain. The patient is otherwise without complaint today.   Past Medical History:  Diagnosis Date  . Arthritis   . Breast cancer, left breast ([place]) 2002   "then treated w/chemo and radiation"  . Breast cancer, left breast ([place]) 07/14/2017   "tx'd w/mastectomy"  . Chronic back pain    h/o lumbar stenosis; "no problem since my OR" (`10/27/2017)  . Chronic systolic CHF (congestive heart failure) ([place])   . Complication of anesthesia    last surgery iv med when going to sleep"burned as injected"  . COPD (chronic obstructive pulmonary disease) ([facility])    no inhalers   . DCM (dilated cardiomyopathy) ([place])    EF 15-20% ? tachycardia induced - EF 35-40% by echo 05/2018  . Dyspnea    with exertion  . Gallstones     . GERD (gastroesophageal reflux disease)    once in a while;depends on what she eats  . History of bronchitis    "chronic when I smoked; no problem since I quit in 2002" (10/27/2017)  . Hyperlipidemia   . Hypertension    takes Losartan and Metoprolol.   . Joint pain   . Muscle spasm    takes Flexeril daily as needed   . Nocturia   . OSA (obstructive sleep apnea) 08/22/2018   Severe obstructive sleep apnea with an AHI 47.3/h and no significant central sleep apnea with moderate oxygen desaturations as low as 79%  . Osteopenia   . Persistent atrial fibrillation ([place])    s/p TEE DCCV and repeat DCCV 11/14/2013 and 02/2018  . Personal history of colonic polyps    adenomas 03 and 08  . Pneumonia    hx  . Seasonal allergies   . Thyroid nodule   . Type II diabetes mellitus ([place])   . Vitamin D deficiency    takes Vit D    Past Surgical History:  Procedure Laterality Date  . ATRIAL FIBRILLATION ABLATION N/A 12/12/2018   Procedure: ATRIAL FIBRILLATION ABLATION;  Surgeon: Thompson Grayer, MD;  Location: [facility];  Service: Cardiovascular;  Laterality: N/A;  . AXILLARY LYMPH NODE DISSECTION Left 10/2001   persistent intramammary node Archie Endo 03/09/2011  . BACK SURGERY    . BREAST BIOPSY Left  2002  . BREAST BIOPSY Right 06/2017   "node bx was negative"  . BREAST BIOPSY Left 06/2017   "positive for cancer"  . BREAST LUMPECTOMY WITH RADIOACTIVE SEED LOCALIZATION Right 08/15/2017   Procedure: RIGHT BREAST LUMPECTOMY WITH RADIOACTIVE SEED LOCALIZATION;  Surgeon: Jovita Kussmaul, MD;  Location: [facility];  Service: General;  Laterality: Right;  . CARDIAC CATHETERIZATION  2015  . CARDIOVERSION N/A 11/12/2013   Procedure: CARDIOVERSION;  Surgeon: Thayer Headings, MD;  Location: [facility] ENDOSCOPY;  Service: Cardiovascular;  Laterality: N/A;  10:08  Dr. Marissa Nestle, anesthesia present, [place]   60mg ,  propofol 50mg , IV for elective cardioversion....Dr. Cathie Olden delievered synch 120 joules with successful  cardioversion to NSR  . CARDIOVERSION N/A 11/12/2013   Procedure: CARDIOVERSION;  Surgeon: Thayer Headings, MD;  Location: [facility];  Service: Cardiovascular;  Laterality: N/A;  . CARDIOVERSION N/A 11/14/2013   Procedure: CARDIOVERSION (BEDSIDE);  Surgeon: Larey Dresser, MD;  Location: [facility];  Service: Cardiovascular;  Laterality: N/A;  . CARDIOVERSION N/A 02/22/2018   Procedure: CARDIOVERSION;  Surgeon: Sueanne Margarita, MD;  Location: [facility] ENDOSCOPY;  Service: Cardiovascular;  Laterality: N/A;  . CATARACT EXTRACTION W/ INTRAOCULAR LENS  IMPLANT, BILATERAL Bilateral ~ 2016  . COLONOSCOPY  2003, September 2008, April 01, 2011   adenomas 03 and 08, polyp 12, diverticulosis  . COLONOSCOPY WITH PROPOFOL N/A 11/16/2016   Procedure: COLONOSCOPY WITH PROPOFOL;  Surgeon: Gatha Mayer, MD;  Location: [facility] ENDOSCOPY;  Service: Endoscopy;  Laterality: N/A;  . JOINT REPLACEMENT    . LAPAROSCOPIC CHOLECYSTECTOMY  2004  . LEFT AND RIGHT HEART CATHETERIZATION WITH CORONARY ANGIOGRAM N/A 02/01/2014   Procedure: LEFT AND RIGHT HEART CATHETERIZATION WITH CORONARY ANGIOGRAM;  Surgeon: Sueanne Margarita, MD;  Location: [place] CATH LAB;  Service: Cardiovascular;  Laterality: N/A;  . LUMBAR LAMINECTOMY/DECOMPRESSION MICRODISCECTOMY N/A 03/19/2014   Procedure: LUMBAR LAMINECTOMY/DECOMPRESSION MICRODISCECTOMY 4 LEVEL;  Surgeon: Kristeen Miss, MD;  Location: [place] NEURO ORS;  Service: Neurosurgery;  Laterality: N/A;  L1-2 L2-3 L3-4 L4-5 Laminectomy/Foraminotomy  . MASS EXCISION Left 08/15/2017   Procedure: EXCISION OF LEFT BREAST MASS;  Surgeon: Jovita Kussmaul, MD;  Location: [place];  Service: General;  Laterality: Left;  Marland Kitchen MASTECTOMY Left 10/27/2017  . MASTECTOMY PARTIAL / LUMPECTOMY W/ AXILLARY LYMPHADENECTOMY  05/08/2001   Archie Endo 03/09/2011  . PORT-A-CATH REMOVAL  2003  . PORTA CATH INSERTION  2002  . TEE WITHOUT CARDIOVERSION N/A 11/12/2013   Procedure: TRANSESOPHAGEAL ECHOCARDIOGRAM (TEE);  Surgeon: Thayer Headings, MD;   Location: [place];  Service: Cardiovascular;  Laterality: N/A;  pt b/p low, pt buccal membranes very dry, lips scapped, pt c/o thirst. NPO since MN and iv FLUIDS TOTAL INFUSING AT TOTAL 20ML HR....  Dr. Cathie Olden order allow NS to bolus during procedure....very dry,NS bolus 250 ml total..pt responding well to meds..  . TOTAL KNEE ARTHROPLASTY Left 2006  . TOTAL KNEE ARTHROPLASTY Right 05/10/2016   Procedure: RIGHT TOTAL KNEE ARTHROPLASTY;  Surgeon: Gaynelle Arabian, MD;  Location: [facility];  Service: Orthopedics;  Laterality: Right;  With adductor block  . TOTAL MASTECTOMY Left 10/27/2017   Procedure: LEFT MASTECTOMY;  Surgeon: Jovita Kussmaul, MD;  Location: [place];  Service: General;  Laterality: Left;  . TUBAL LIGATION      Current Outpatient Medications  Medication Sig Dispense Refill  . amoxicillin (AMOXIL) 500 MG capsule Take 2,000 mg by mouth See admin instructions. Take 2000 mg by mouth 1 hour prior to dental appointment  5  . atorvastatin (LIPITOR)  40 MG tablet TAKE 1 TABLET BY MOUTH EVERYDAY AT BEDTIME 90 tablet 3  . BYSTOLIC 2.5 MG tablet TAKE 1 TABLET BY MOUTH EVERY DAY 90 tablet 1  . Cholecalciferol (VITAMIN D3) 2000 units capsule Take 2,000 Units by mouth daily.    . clotrimazole-betamethasone (LOTRISONE) cream Apply 1 application topically 2 (two) times daily. 30 g 0  . fexofenadine (ALLEGRA) 180 MG tablet Take 180 mg by mouth daily as needed for allergies or rhinitis.    . fluconazole (DIFLUCAN) 150 MG tablet Take 1 tablet (150 mg total) by mouth once a week. 6 tablet 2  . fluticasone (FLONASE) 50 MCG/ACT nasal spray Place 1 spray into both nostrils daily as needed for allergies.     . furosemide (LASIX) 20 MG tablet TAKE 1 TABLET BY MOUTH TWICE A DAY 180 tablet 2  . gabapentin (NEURONTIN) 300 MG capsule TAKE 1 CAPSULE BY MOUTH EVERYDAY AT BEDTIME 30 capsule 0  . glucose blood test strip Use to check CBG AC and QHS. 200 each 12  . KLOR-CON M20 20 MEQ tablet TAKE 1 TABLET BY MOUTH EVERY  DAY 90 tablet 2  . letrozole (FEMARA) 2.5 MG tablet TAKE 1 TABLET BY MOUTH EVERY DAY 90 tablet 3  . losartan (COZAAR) 25 MG tablet TAKE 1 TABLET BY MOUTH TWICE A DAY 180 tablet 0  . metFORMIN (GLUCOPHAGE) 500 MG tablet Take 1 tablet (500 mg total) by mouth 2 (two) times daily with a meal. 180 tablet 1  . pantoprazole (PROTONIX) 40 MG tablet TAKE 1 TABLET BY MOUTH EVERY DAY 90 tablet 3  . spironolactone (ALDACTONE) 25 MG tablet TAKE 1 TABLET BY MOUTH EVERY DAY 90 tablet 2  . traMADol (ULTRAM) 50 MG tablet Take 50 mg by mouth every 6 (six) hours as needed for moderate pain.    Marland Kitchen XARELTO 20 MG TABS tablet TAKE 1 TABLET BY MOUTH EVERY DAY WITH SUPPER 90 tablet 1   No current facility-administered medications for this visit.    Allergies:   Tikosyn [dofetilide], Zolpidem, Codeine, Codeine phosphate, and Methylprednisolone   Social History:  The patient  reports that she quit smoking about 18 years ago. Her smoking use included cigarettes. She has a 52.50 pack-year smoking history. She has never used smokeless tobacco. She reports that she does not drink alcohol or use drugs.   Family History:  The patient's family history includes Alzheimer's disease in her sister; Dementia in her sister; Diabetes in her brother, father, mother, and sister; Heart disease in her brother and father; Irritable bowel syndrome in her brother; Liver cancer in her mother; Pancreatic cancer in her mother.   ROS:  Please see the history of present illness.   All other systems are personally reviewed and negative.    Exam:    Vital Signs:  Ht 5\' 6"  (1.676 m)   Wt 228 lb (103.4 kg)   BMI 36.80 kg/m   Well sounding, alert and conversant   Labs/Other Tests and Data Reviewed:    Recent Labs: 08/16/2019: Creatinine, Ser 1.20   Wt Readings from Last 3 Encounters:  12/11/19 228 lb (103.4 kg)  11/15/19 231 lb 1.6 oz (104.8 kg)  09/17/19 225 lb 9.6 oz (102.3 kg)    Echo 09/25/19- EF 45-50%, mild LA  enlargement  ASSESSMENT & PLAN:    1.  Persistent atrial fibrillation Doing very well post ablation off amiodarone chads2vascs core is at least 5.  Continue on xarelto If she has increased palpitations, we should consider  implantation of a loop recorder for afib management post ablation.  I would not think that short term monitoring would provide further value for her going forward.  2. HTN Stable No change required today  3. OSA Uses dental appliance  3. Chronic systolic dysfunction/ nonischemic CM EF 45% Clinically improved Will need to follow-up with Dr Theodosia Blender team for further medical optimization  4. Obesity Lifestyle modification encouraged  Follow-up:  3 months with me Follow-up with Dr Theodosia Blender team for CHF management   Patient Risk:  after full review of this patients clinical status, I feel that they are at moderate risk at this time.  Today, I have spent 15 minutes with the patient with telehealth technology discussing arrhythmia management .    Army Fossa, MD  12/11/2019 8:53 AM     [facility] [address] [place] [address] [phone] (office) [phone] (fax)

## 2019-12-14 ENCOUNTER — Ambulatory Visit: Payer: Medicare Other | Admitting: Cardiology

## 2019-12-15 ENCOUNTER — Ambulatory Visit: Payer: Medicare Other | Attending: Internal Medicine

## 2019-12-15 DIAGNOSIS — Z23 Encounter for immunization: Secondary | ICD-10-CM

## 2019-12-15 NOTE — Progress Notes (Signed)
   Covid-19 Vaccination Clinic  Name:  Cynthia Herrera    MRN: [id number] DOB: July 20, 1946  12/15/2019  Ms. Gutter was observed post Covid-19 immunization for 15 minutes without incidence. She was provided with Vaccine Information Sheet and instruction to access the V-Safe system.   [name] was instructed to call 911 with any severe reactions post vaccine: Marland Kitchen Difficulty breathing  . Swelling of your face and throat  . A fast heartbeat  . A bad rash all over your body  . Dizziness and weakness    Immunizations Administered    Name Date Dose VIS Date Route   Pfizer COVID-19 Vaccine 12/15/2019  1:32 PM 0.3 mL 10/05/2019 Intramuscular   Manufacturer: [place]   Lot: [id number]   [place]: [id number]

## 2019-12-28 ENCOUNTER — Other Ambulatory Visit: Payer: Self-pay

## 2019-12-28 ENCOUNTER — Encounter: Payer: Self-pay | Admitting: Cardiology

## 2019-12-28 ENCOUNTER — Ambulatory Visit: Payer: Medicare Other | Admitting: Cardiology

## 2019-12-28 VITALS — BP 124/60 | HR 74 | Ht 66.0 in | Wt 236.0 lb

## 2019-12-28 DIAGNOSIS — I2583 Coronary atherosclerosis due to lipid rich plaque: Secondary | ICD-10-CM

## 2019-12-28 DIAGNOSIS — I420 Dilated cardiomyopathy: Secondary | ICD-10-CM | POA: Diagnosis not present

## 2019-12-28 DIAGNOSIS — G4733 Obstructive sleep apnea (adult) (pediatric): Secondary | ICD-10-CM

## 2019-12-28 DIAGNOSIS — I4819 Other persistent atrial fibrillation: Secondary | ICD-10-CM

## 2019-12-28 DIAGNOSIS — I2510 Atherosclerotic heart disease of native coronary artery without angina pectoris: Secondary | ICD-10-CM | POA: Diagnosis not present

## 2019-12-28 DIAGNOSIS — E785 Hyperlipidemia, unspecified: Secondary | ICD-10-CM

## 2019-12-28 DIAGNOSIS — I10 Essential (primary) hypertension: Secondary | ICD-10-CM | POA: Diagnosis not present

## 2019-12-28 DIAGNOSIS — E119 Type 2 diabetes mellitus without complications: Secondary | ICD-10-CM

## 2019-12-28 NOTE — Patient Instructions (Signed)
Medication Instructions:  Your physician recommends that you continue on your current medications as directed. Please refer to the Current Medication list given to you today.  *If you need a refill on your cardiac medications before your next appointment, please call your pharmacy*   Lab Work: Fasting lipids, CBC, CMET If you have labs (blood work) drawn today and your tests are completely normal, you will receive your results only by: Marland Kitchen MyChart Message (if you have MyChart) OR . A paper copy in the mail If you have any lab test that is abnormal or we need to change your treatment, we will call you to review the results.  Follow-Up: At [facility], you and your health needs are our priority.  As part of our continuing mission to provide you with exceptional heart care, we have created designated Provider Care Teams.  These Care Teams include your primary Cardiologist (physician) and Advanced Practice Providers (APPs -  Physician Assistants and Nurse Practitioners) who all work together to provide you with the care you need, when you need it.  Your next appointment:   6 month(s)  The format for your next appointment:   In Person  Provider:   [name], MD

## 2019-12-28 NOTE — Progress Notes (Signed)
Cardiology Office Note:    Date:  12/28/2019   ID:  Cynthia Herrera, DOB [birth year], MRN [id number]  PCP:  Cynthia Herrera, Cynthia Halsted, MD  Cardiologist:  Cynthia Him, MD    Referring MD: Cynthia Herrera, [name]   Chief Complaint  Patient presents with  . Coronary Artery Disease  . Cardiomyopathy  . Atrial Fibrillation    History of Present Illness:    Cynthia Herrera is a 74 y.o. 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 She was again cardioverted on 1/21/18after more loading with amio but failed to convert. Later that day she spontaneously converted to NSR. She stopped amio due to severe dizziness.   I saw her back in April 2018 she was back in atrial fibrillation with RVR.  She underwent repeat cardioversion to sinus rhythm on 02/22/2018.  Myoview 03/2018 showed EF improved  with no ischemia.  She is now followed in afib clinic.   Repeat 2D echocardiogram 06/21/2018 showed improvement in LV function with EF improved from 25% up to 35 to 40%. She was seen by Dr. [name] in August for chronic systolic heart failure with nonischemic dilated tachycardia mediated A. fib.  She is followed in AHF clinic by Dr. [name]  She underwent sleep study showing severe obstructive sleep apnea with an AHI 47.3/h and no significant central sleep apnea with moderate oxygen desaturations as low as 79%.  She ultimately ended up seeing Dr. [name] for an oral device for her OSA but was supposed to get a CPAP titration done.    She is here today for followup and is doing well.  She denies any chest pain or pressure, PND, orthopnea, LE edema, dizziness, palpitations or syncope. Since COVID 19 started she has been very sedentary and has gained weight.   She has noticed that recently she got DOE when walking up an incline but is fine walking on flat ground without problems.  She is compliant with her meds and is tolerating meds with no SE.    Past Medical History:  Diagnosis Date  . Arthritis   . Breast cancer, left breast ([place]) 2002   "then treated w/chemo and radiation"  . Breast cancer, left breast ([place]) 07/14/2017   "tx'd w/mastectomy"  . Chronic back pain    h/o lumbar stenosis; "no problem since my OR" (`10/27/2017)  . Chronic systolic CHF (congestive heart failure) ([place])   . Complication of anesthesia    last surgery iv med when going to sleep"burned as injected"  . COPD (chronic obstructive pulmonary disease) ([facility])    no inhalers   . DCM (dilated cardiomyopathy) ([place])    EF 15-20% ? tachycardia induced - EF 35-40% by echo 05/2018  . Dyspnea    with exertion  . Gallstones   . GERD (gastroesophageal reflux disease)    once in a while;depends on what she eats  . History of bronchitis    "chronic when I smoked; no problem since I quit in 2002" (10/27/2017)  . Hyperlipidemia   . Hypertension    takes Losartan and Metoprolol.   . Joint pain   . Muscle spasm    takes Flexeril daily as needed   . Nocturia   . OSA (obstructive sleep apnea) 08/22/2018   Severe obstructive sleep apnea  with an AHI 47.3/h and no significant central sleep apnea with moderate oxygen desaturations as low as 79%  . Osteopenia   . Persistent atrial fibrillation ([place])    s/p TEE DCCV and repeat DCCV 11/14/2013 and 02/2018  . Personal history of colonic polyps    adenomas 03 and 08  . Pneumonia    hx  . Seasonal allergies   . Thyroid nodule   . Type II diabetes mellitus ([place])   . Vitamin D deficiency    takes Vit D    Past Surgical History:  Procedure Laterality Date  . ATRIAL FIBRILLATION ABLATION N/A 12/12/2018   Procedure: ATRIAL FIBRILLATION ABLATION;  Surgeon: Thompson Grayer, MD;  Location: [place] [facility];  Service: Cardiovascular;  Laterality:  N/A;  . AXILLARY LYMPH NODE DISSECTION Left 10/2001   persistent intramammary node Cynthia Herrera 03/09/2011  . BACK SURGERY    . BREAST BIOPSY Left 2002  . BREAST BIOPSY Right 06/2017   "node bx was negative"  . BREAST BIOPSY Left 06/2017   "positive for cancer"  . BREAST LUMPECTOMY WITH RADIOACTIVE SEED LOCALIZATION Right 08/15/2017   Procedure: RIGHT BREAST LUMPECTOMY WITH RADIOACTIVE SEED LOCALIZATION;  Surgeon: Jovita Kussmaul, MD;  Location: [place];  Service: General;  Laterality: Right;  . CARDIAC CATHETERIZATION  2015  . CARDIOVERSION N/A 11/12/2013   Procedure: CARDIOVERSION;  Surgeon: Thayer Headings, MD;  Location: [facility] ENDOSCOPY;  Service: Cardiovascular;  Laterality: N/A;  10:08  Dr. Marissa Nestle, anesthesia present, [place]   60mg ,  propofol 50mg , IV for elective cardioversion....Dr. Cathie Olden delievered synch 120 joules with successful cardioversion to NSR  . CARDIOVERSION N/A 11/12/2013   Procedure: CARDIOVERSION;  Surgeon: Thayer Headings, MD;  Location: [place];  Service: Cardiovascular;  Laterality: N/A;  . CARDIOVERSION N/A 11/14/2013   Procedure: CARDIOVERSION (BEDSIDE);  Surgeon: Larey Dresser, MD;  Location: [place];  Service: Cardiovascular;  Laterality: N/A;  . CARDIOVERSION N/A 02/22/2018   Procedure: CARDIOVERSION;  Surgeon: Sueanne Margarita, MD;  Location: [facility] ENDOSCOPY;  Service: Cardiovascular;  Laterality: N/A;  . CATARACT EXTRACTION W/ INTRAOCULAR LENS  IMPLANT, BILATERAL Bilateral ~ 2016  . COLONOSCOPY  2003, September 2008, April 01, 2011   adenomas 03 and 08, polyp 12, diverticulosis  . COLONOSCOPY WITH PROPOFOL N/A 11/16/2016   Procedure: COLONOSCOPY WITH PROPOFOL;  Surgeon: Gatha Mayer, MD;  Location: [facility] ENDOSCOPY;  Service: Endoscopy;  Laterality: N/A;  . JOINT REPLACEMENT    . LAPAROSCOPIC CHOLECYSTECTOMY  2004  . LEFT AND RIGHT HEART CATHETERIZATION WITH CORONARY ANGIOGRAM N/A 02/01/2014   Procedure: LEFT AND RIGHT HEART CATHETERIZATION WITH CORONARY ANGIOGRAM;  Surgeon:  Sueanne Margarita, MD;  Location: [place] CATH LAB;  Service: Cardiovascular;  Laterality: N/A;  . LUMBAR LAMINECTOMY/DECOMPRESSION MICRODISCECTOMY N/A 03/19/2014   Procedure: LUMBAR LAMINECTOMY/DECOMPRESSION MICRODISCECTOMY 4 LEVEL;  Surgeon: Kristeen Miss, MD;  Location: [place] NEURO ORS;  Service: Neurosurgery;  Laterality: N/A;  L1-2 L2-3 L3-4 L4-5 Laminectomy/Foraminotomy  . MASS EXCISION Left 08/15/2017   Procedure: EXCISION OF LEFT BREAST MASS;  Surgeon: Jovita Kussmaul, MD;  Location: [place];  Service: General;  Laterality: Left;  Marland Kitchen MASTECTOMY Left 10/27/2017  . MASTECTOMY PARTIAL / LUMPECTOMY W/ AXILLARY LYMPHADENECTOMY  05/08/2001   Cynthia Herrera 03/09/2011  . PORT-A-CATH REMOVAL  2003  . PORTA CATH INSERTION  2002  . TEE WITHOUT CARDIOVERSION N/A 11/12/2013   Procedure: TRANSESOPHAGEAL ECHOCARDIOGRAM (TEE);  Surgeon: Thayer Headings, MD;  Location: [place];  Service: Cardiovascular;  Laterality: N/A;  pt b/p low, pt  buccal membranes very dry, lips scapped, pt c/o thirst. NPO since MN and iv FLUIDS TOTAL INFUSING AT TOTAL 20ML HR....  Dr. Cathie Olden order allow NS to bolus during procedure....very dry,NS bolus 250 ml total..pt responding well to meds..  . TOTAL KNEE ARTHROPLASTY Left 2006  . TOTAL KNEE ARTHROPLASTY Right 05/10/2016   Procedure: RIGHT TOTAL KNEE ARTHROPLASTY;  Surgeon: Gaynelle Arabian, MD;  Location: [facility] ORS;  Service: Orthopedics;  Laterality: Right;  With adductor block  . TOTAL MASTECTOMY Left 10/27/2017   Procedure: LEFT MASTECTOMY;  Surgeon: Jovita Kussmaul, MD;  Location: [place];  Service: General;  Laterality: Left;  . TUBAL LIGATION      Current Medications: Current Meds  Medication Sig  . amoxicillin (AMOXIL) 500 MG capsule Take 2,000 mg by mouth See admin instructions. Take 2000 mg by mouth 1 hour prior to dental appointment  . atorvastatin (LIPITOR) 40 MG tablet TAKE 1 TABLET BY MOUTH EVERYDAY AT BEDTIME  . BYSTOLIC 2.5 MG tablet TAKE 1 TABLET BY MOUTH EVERY DAY  . Cholecalciferol  (VITAMIN D3) 2000 units capsule Take 2,000 Units by mouth daily.  . clotrimazole-betamethasone (LOTRISONE) cream Apply 1 application topically 2 (two) times daily.  . fexofenadine (ALLEGRA) 180 MG tablet Take 180 mg by mouth daily as needed for allergies or rhinitis.  . fluconazole (DIFLUCAN) 150 MG tablet Take 1 tablet (150 mg total) by mouth once a week.  . fluticasone (FLONASE) 50 MCG/ACT nasal spray Place 1 spray into both nostrils daily as needed for allergies.   . furosemide (LASIX) 20 MG tablet TAKE 1 TABLET BY MOUTH TWICE A DAY  . gabapentin (NEURONTIN) 300 MG capsule TAKE 1 CAPSULE BY MOUTH EVERYDAY AT BEDTIME  . glucose blood test strip Use to check CBG AC and QHS.  Marland Kitchen KLOR-CON M20 20 MEQ tablet TAKE 1 TABLET BY MOUTH EVERY DAY  . letrozole (FEMARA) 2.5 MG tablet TAKE 1 TABLET BY MOUTH EVERY DAY  . losartan (COZAAR) 25 MG tablet TAKE 1 TABLET BY MOUTH TWICE A DAY  . metFORMIN (GLUCOPHAGE) 500 MG tablet Take 1 tablet (500 mg total) by mouth 2 (two) times daily with a meal.  . pantoprazole (PROTONIX) 40 MG tablet TAKE 1 TABLET BY MOUTH EVERY DAY  . spironolactone (ALDACTONE) 25 MG tablet TAKE 1 TABLET BY MOUTH EVERY DAY  . traMADol (ULTRAM) 50 MG tablet Take 50 mg by mouth every 6 (six) hours as needed for moderate pain.  Marland Kitchen XARELTO 20 MG TABS tablet TAKE 1 TABLET BY MOUTH EVERY DAY WITH SUPPER     Allergies:   Tikosyn [dofetilide], Zolpidem, Codeine, Codeine phosphate, and Methylprednisolone   Social History   Socioeconomic History  . Marital status: Widowed    Spouse name: Not on file  . Number of children: 2  . Years of education: Not on file  . Highest education level: Not on file  Occupational History  . Occupation: retired  Tobacco Use  . Smoking status: Former Smoker    Packs/day: 1.50    Years: 35.00    Pack years: 52.50    Types: Cigarettes    Quit date: 08/24/2001    Years since quitting: 18.3  . Smokeless tobacco: Never Used  Substance and Sexual Activity  .  Alcohol use: No  . Drug use: No  . Sexual activity: Never    Birth control/protection: Post-menopausal  Other Topics Concern  . Not on file  Social History Narrative  . Not on file   Social Determinants of Health  Financial Resource Strain:   . Difficulty of Paying Living Expenses: Not on file  Food Insecurity:   . Worried About Charity fundraiser in the Last Year: Not on file  . Ran Out of Food in the Last Year: Not on file  Transportation Needs:   . Lack of Transportation (Medical): Not on file  . Lack of Transportation (Non-Medical): Not on file  Physical Activity:   . Days of Exercise per Week: Not on file  . Minutes of Exercise per Session: Not on file  Stress:   . Feeling of Stress : Not on file  Social Connections:   . Frequency of Communication with Friends and Family: Not on file  . Frequency of Social Gatherings with Friends and Family: Not on file  . Attends Religious Services: Not on file  . Active Member of Clubs or Organizations: Not on file  . Attends Archivist Meetings: Not on file  . Marital Status: Not on file     Family History: The patient's family history includes Alzheimer's disease in her sister; Dementia in her sister; Diabetes in her brother, father, mother, and sister; Heart disease in her brother and father; Irritable bowel syndrome in her brother; Liver cancer in her mother; Pancreatic cancer in her mother.  ROS:   Please see the history of present illness.    ROS  All other systems reviewed and negative.   EKGs/Labs/Other Studies Reviewed:    The following studies were reviewed today: Office notes from Advanced heart failure and afib clinic  EKG:  EKG is  ordered today.  The ekg ordered today demonstrates NSR with PVCs at 74bpm with nonspecific ST abnormality  Recent Labs: 08/16/2019: Creatinine, Ser 1.20   Recent Lipid Panel    Component Value Date/Time   CHOL 121 03/03/2018 0947   TRIG 128 03/03/2018 0947   HDL 39 (L)  03/03/2018 0947   CHOLHDL 3.1 03/03/2018 0947   CHOLHDL 3 02/03/2017 1207   VLDL 23.0 02/03/2017 1207   LDLCALC 56 03/03/2018 0947    Physical Exam:    VS:  BP 124/60   Pulse 74   Ht 5\' 6"  (1.676 m)   Wt 236 lb (107 kg)   BMI 38.09 kg/m     Wt Readings from Last 3 Encounters:  12/28/19 236 lb (107 kg)  12/11/19 228 lb (103.4 kg)  11/15/19 231 lb 1.6 oz (104.8 kg)     GEN:  Well nourished, well developed in no acute distress HEENT: Normal NECK: No JVD; No carotid bruits LYMPHATICS: No lymphadenopathy CARDIAC: RRR, no murmurs, rubs, gallops RESPIRATORY:  Clear to auscultation without rales, wheezing or rhonchi  ABDOMEN: Soft, non-tender, non-distended MUSCULOSKELETAL:  No edema; No deformity  SKIN: Warm and dry NEUROLOGIC:  Alert and oriented x 3 PSYCHIATRIC:  Normal affect   ASSESSMENT:    1. Coronary artery disease due to lipid rich plaque   2. DCM (dilated cardiomyopathy) ([place])   3. Persistent atrial fibrillation ([place])   4. Essential hypertension   5. OSA (obstructive sleep apnea)   6. Diabetes mellitus with coincident hypertension ([place])    PLAN:    In order of problems listed above: 1.  Chronic combined systolic/diastolic CHF  -she appears euvolemic on exam today -weight has been stable and she denies any LE edema or SOB -she remains NYHA Class 2a  -continue Bystolic 2.5mg  daily, Losartan 25mg  daily, spiro 25mg  daily -check BMET today  2.  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   -  Cath 2015 with normal cors.   -Treated with chemo/XRT in 2002 for breast CA but does not think she got Adriamycin.   -EF 45% at time of cath in 2015 but dropped to 25% in 02/2018 at time of afib with RVR reoccurrence. -Echo 05/2018 showed improvement in EF from 25% now up to 35-40%.   -last echo 09/2019 showed EF 45-50% with G1DD and mild LAEwith  -continue BB, diuretic and ARB  3.   Persistent atrial fibrillation  -maintaining NSR - off amio due to severe dizziness -Failed  Tikosyn 03/2018 due to QT prolongation.  -denies any palpitations -no bleeding on DOAC -continue Xarelto 20mg  daily -check BMET and Hbg  4.  HTN -BP controlled on exam  -continue Bystolic 2.5mg  daily, Losartan 25mg  daily and spiro   5.  OSA  - PSG showed  severe obstructive sleep apnea with an AHI 47.3/h and no significant central sleep apnea with moderate oxygen desaturations as low as 79%.  -she had a CPAP titration but did not tolerate the mask and is now using an oral device and seeing Dr. [name] -she is having a home sleep study next week and she will have a copy of the study sent to me  6.  DM type 2 -followed by PCP -Hb1C was 7.4% in Jan -continue Metformin and ARB  Medication Adjustments/Labs and Tests Ordered: Current medicines are reviewed at length with the patient today.  Concerns regarding medicines are outlined above.  Orders Placed This Encounter  Procedures  . EKG 12-Lead   No orders of the defined types were placed in this encounter.   Signed, Cynthia Him, MD  12/28/2019 2:45 PM     [facility]

## 2020-01-01 ENCOUNTER — Other Ambulatory Visit: Payer: Self-pay

## 2020-01-01 ENCOUNTER — Other Ambulatory Visit: Payer: Medicare Other

## 2020-01-01 DIAGNOSIS — E785 Hyperlipidemia, unspecified: Secondary | ICD-10-CM | POA: Diagnosis not present

## 2020-01-01 DIAGNOSIS — I4819 Other persistent atrial fibrillation: Secondary | ICD-10-CM | POA: Diagnosis not present

## 2020-01-01 LAB — COMPREHENSIVE METABOLIC PANEL
ALT: 13 IU/L (ref 0–32)
AST: 14 IU/L (ref 0–40)
Albumin/Globulin Ratio: 1.8 (ref 1.2–2.2)
Albumin: 4.2 g/dL (ref 3.7–4.7)
Alkaline Phosphatase: 155 IU/L — ABNORMAL HIGH (ref 39–117)
BUN/Creatinine Ratio: 18 (ref 12–28)
BUN: 21 mg/dL (ref 8–27)
Bilirubin Total: 0.6 mg/dL (ref 0.0–1.2)
CO2: 22 mmol/L (ref 20–29)
Calcium: 9.8 mg/dL (ref 8.7–10.3)
Chloride: 103 mmol/L (ref 96–106)
Creatinine, Ser: 1.14 mg/dL — ABNORMAL HIGH (ref 0.57–1.00)
GFR calc Af Amer: 55 mL/min/{1.73_m2} — ABNORMAL LOW (ref >59)
GFR calc non Af Amer: 47 mL/min/{1.73_m2} — ABNORMAL LOW (ref >59)
Globulin, Total: 2.4 g/dL (ref 1.5–4.5)
Glucose: 166 mg/dL — ABNORMAL HIGH (ref 65–99)
Potassium: 4.4 mmol/L (ref 3.5–5.2)
Sodium: 140 mmol/L (ref 134–144)
Total Protein: 6.6 g/dL (ref 6.0–8.5)

## 2020-01-01 LAB — CBC
Hematocrit: 35.8 % (ref 34.0–46.6)
Hemoglobin: 12 g/dL (ref 11.1–15.9)
MCH: 28.5 pg (ref 26.6–33.0)
MCHC: 33.5 g/dL (ref 31.5–35.7)
MCV: 85 fL (ref 79–97)
Platelets: 306 10*3/uL (ref 150–450)
RBC: 4.21 x10E6/uL (ref 3.77–5.28)
RDW: 14 % (ref 11.7–15.4)
WBC: 7.1 10*3/uL (ref 3.4–10.8)

## 2020-01-01 LAB — LIPID PANEL
Chol/HDL Ratio: 3.3 ratio (ref 0.0–4.4)
Cholesterol, Total: 140 mg/dL (ref 100–199)
HDL: 43 mg/dL (ref >39)
LDL Chol Calc (NIH): 70 mg/dL (ref 0–99)
Triglycerides: 159 mg/dL — ABNORMAL HIGH (ref 0–149)
VLDL Cholesterol Cal: 27 mg/dL (ref 5–40)

## 2020-01-04 IMAGING — MG MM BREAST LOCALIZATION CLIP
4 series · 5 of 12 positions shown · non-contrast
Comparison: Previous exam(s).
COMPARISON: Previous exam(s).

Addendum:
CLINICAL DATA: 72-year-old female for tissue sampling of 0.6 cm
UPPER-OUTER RIGHT breast mass.

EXAM:
ULTRASOUND GUIDED RIGHT BREAST CORE NEEDLE BIOPSY
3D RIGHT MAMMOGRAM POST ULTRASOUND BIOPSY

[R CC synth-2D]
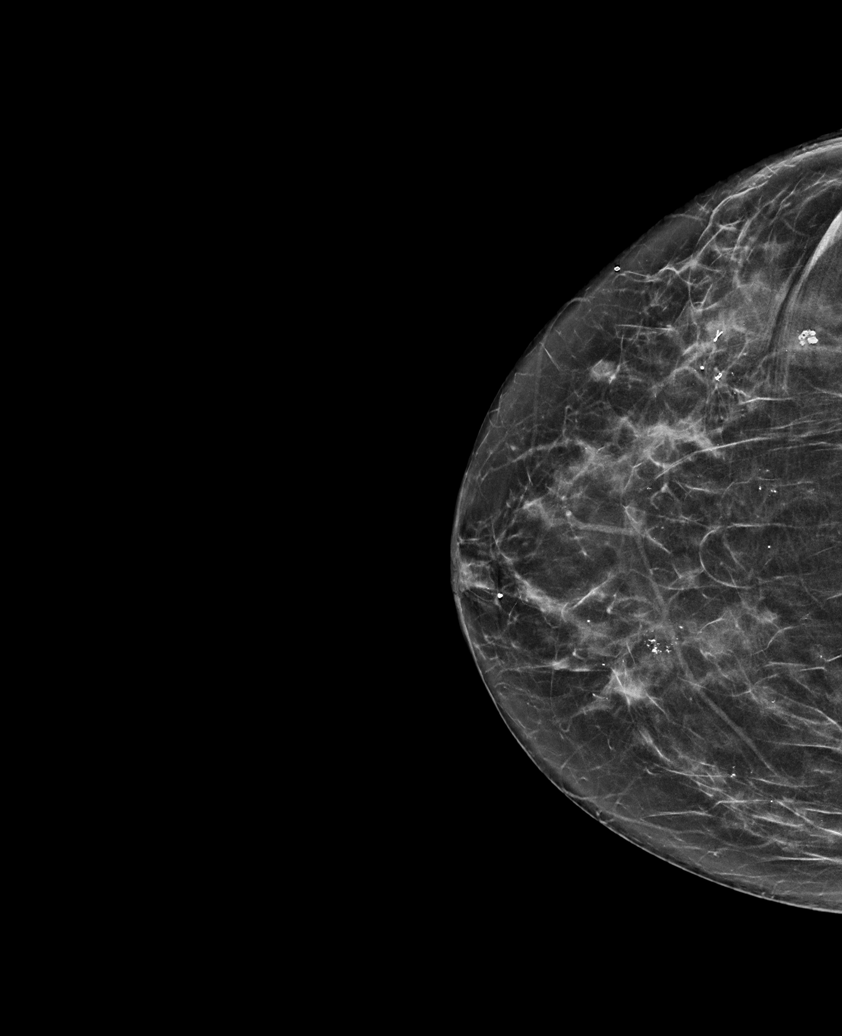

[R ML synth-2D]
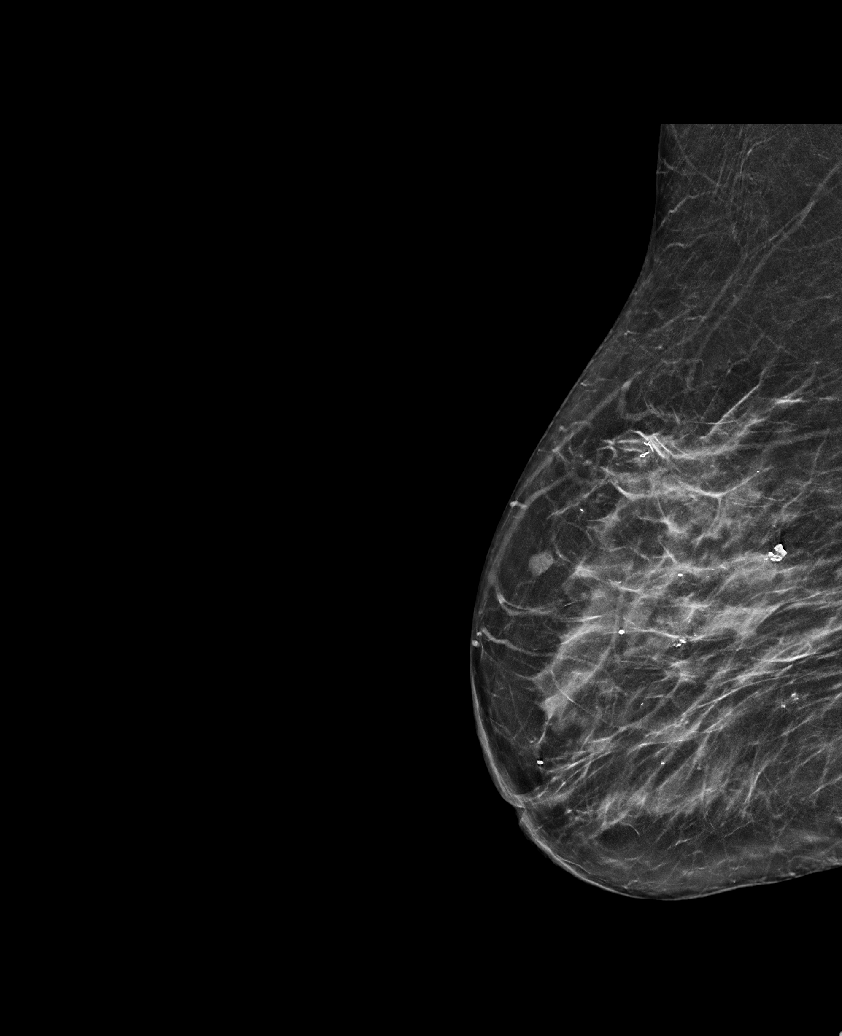

[R CC tomo · 2 of 59 frames shown]
[frame 20/59]
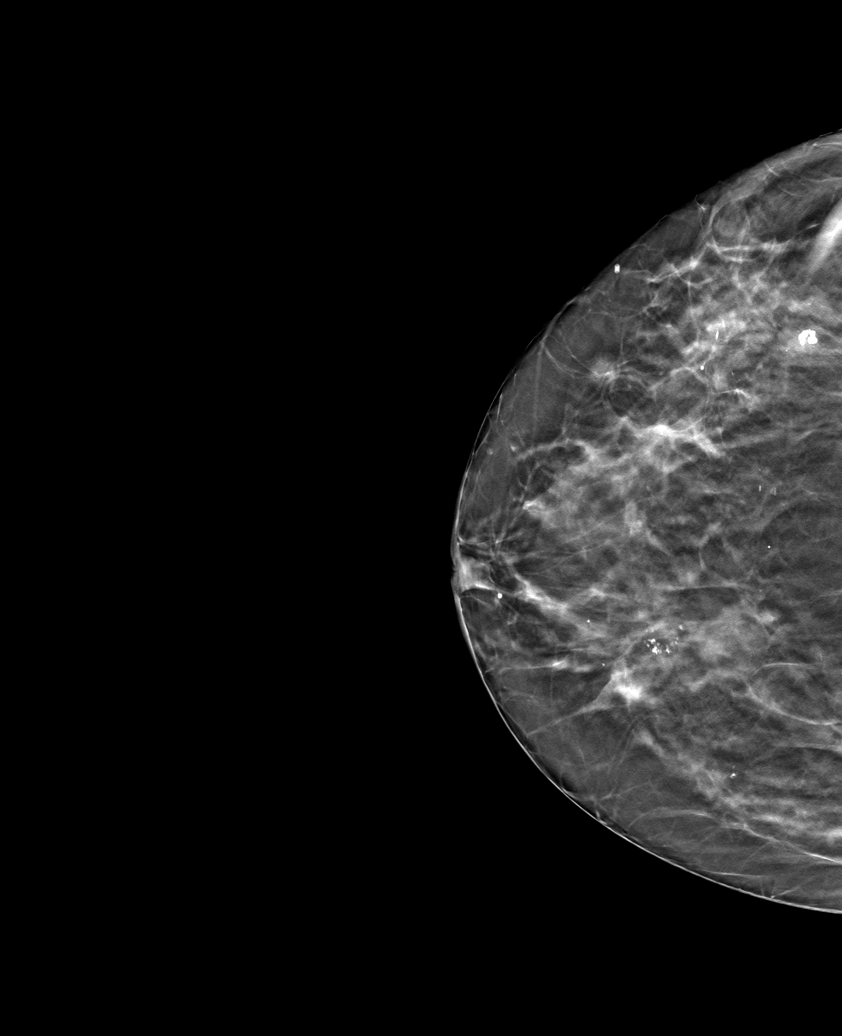
[frame 30/59]
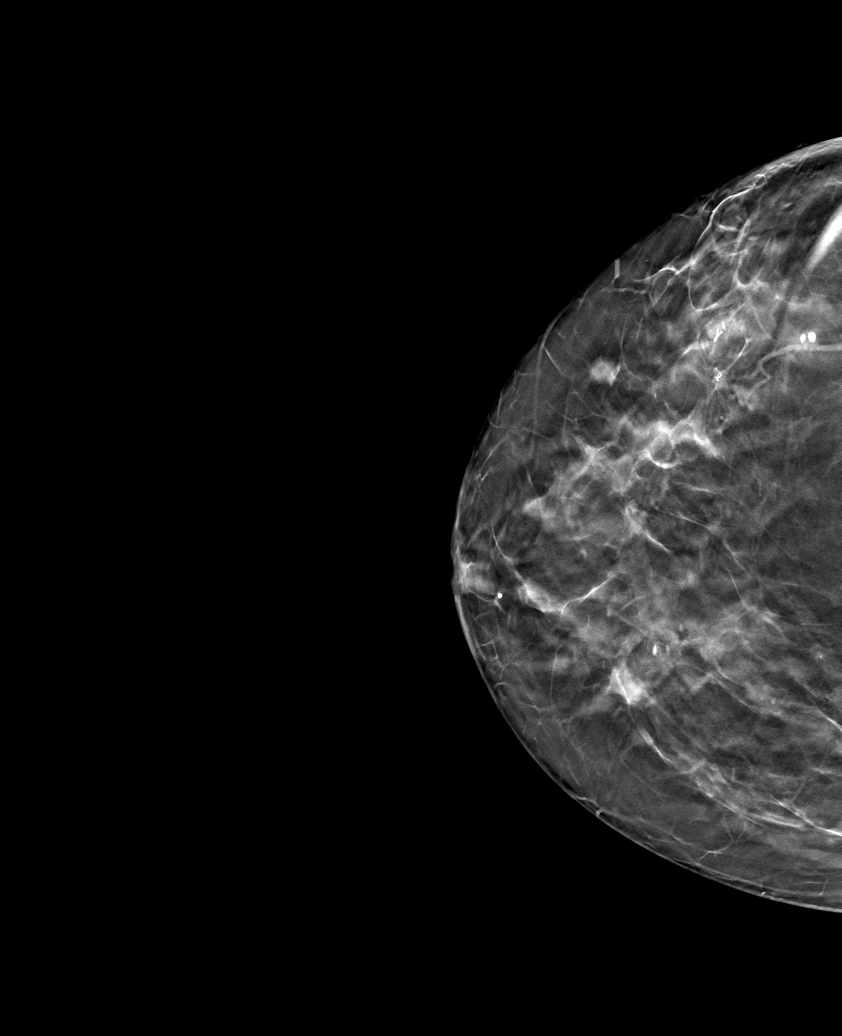

[R ML tomo · tomo slice 35/68.0]
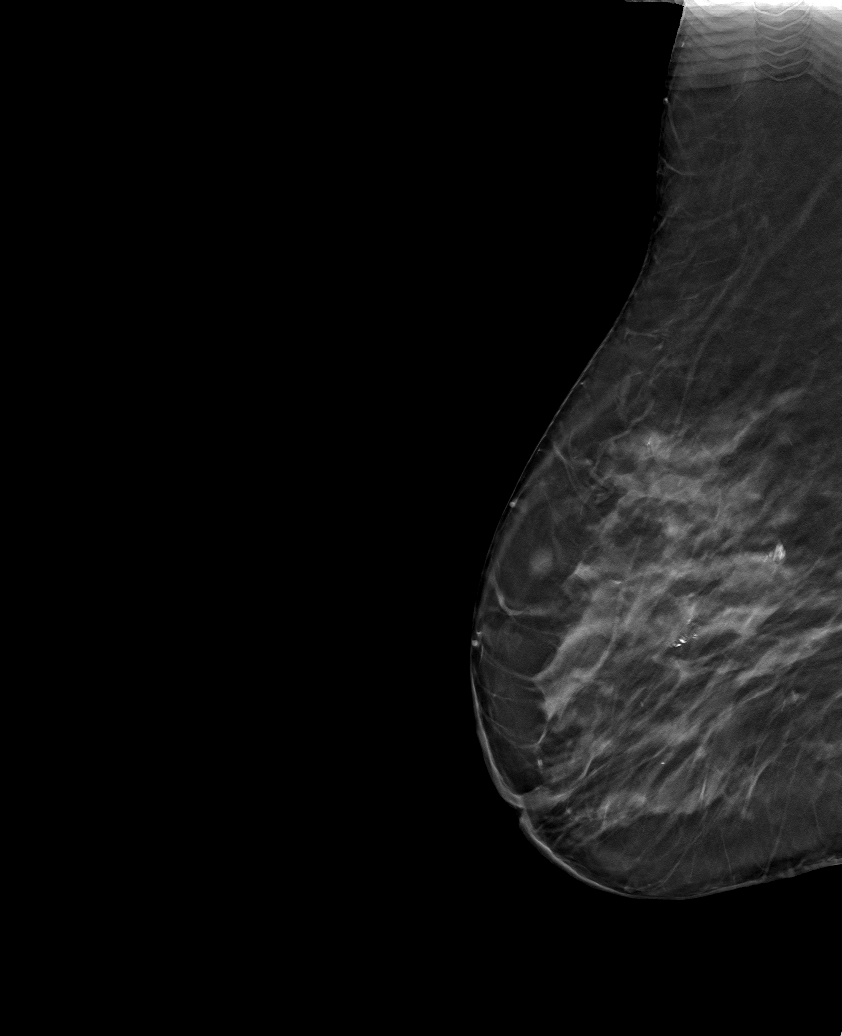

[5 of 12 positions shown; findings below may reference images not displayed]



Lesion quadrant: UPPER-OUTER RIGHT breast

Using sterile technique and 1% Lidocaine as local anesthetic, under
direct ultrasound visualization, a 14 gauge Leida device was
used to perform biopsy of the 0.6 cm hypoechoic mass at the 10
o'clock position of the RIGHT breast 10 cm from the nipple using a
LATERAL approach. At the conclusion of the procedure RIBBON tissue
marker clip was deployed into the biopsy cavity.

Follow up 2 view mammogram was performed demonstrating the RIBBON
clip to be in satisfactory position at the expected site of biopsy
within the UPPER-OUTER RIGHT breast. This also corresponds to the
mass identified on the screening study.
IMPRESSION: Ultrasound guided biopsy of 0.6 cm UPPER-OUTER RIGHT breast mass. No
apparent complications.

Satisfactory position of RIBBON clip following ultrasound-guided
RIGHT breast biopsy and corresponding to the mass identified
mammographically

ADDENDUM:
Pathology revealed BENIGN REACTIVE LYMPH NODE of the RIGHT breast,
upper outer 10 o'clock position. This was found to be concordant by
Dr. Hanz Fyffe.

Pathology results were discussed with the patient by telephone. The
patient reported doing well after the biopsy with tenderness at the
site. Post biopsy instructions and care were reviewed and questions
were answered. The patient was encouraged to call The [REDACTED]

The patient was instructed to return for annual screening
mammography and informed a reminder notice would be sent regarding
this appointment.

Pathology results reported by Giichi Rautela, RN on 09/07/2019.



Lesion quadrant: UPPER-OUTER RIGHT breast

Using sterile technique and 1% Lidocaine as local anesthetic, under
direct ultrasound visualization, a 14 gauge Leida device was
used to perform biopsy of the 0.6 cm hypoechoic mass at the 10
o'clock position of the RIGHT breast 10 cm from the nipple using a
LATERAL approach. At the conclusion of the procedure RIBBON tissue
marker clip was deployed into the biopsy cavity.

Follow up 2 view mammogram was performed demonstrating the RIBBON
clip to be in satisfactory position at the expected site of biopsy
within the UPPER-OUTER RIGHT breast. This also corresponds to the
mass identified on the screening study.
IMPRESSION: Ultrasound guided biopsy of 0.6 cm UPPER-OUTER RIGHT breast mass. No
apparent complications.

Satisfactory position of RIBBON clip following ultrasound-guided
RIGHT breast biopsy and corresponding to the mass identified
mammographically

## 2020-01-04 IMAGING — US US  BREAST BX W/ LOC DEV 1ST LESION IMG BX SPEC US GUIDE*R*
1 series · 12 of 12 positions shown · non-contrast
Comparison: Previous exam(s).
COMPARISON: Previous exam(s).

Addendum:
CLINICAL DATA: 72-year-old female for tissue sampling of 0.6 cm
UPPER-OUTER RIGHT breast mass.

EXAM:
ULTRASOUND GUIDED RIGHT BREAST CORE NEEDLE BIOPSY
3D RIGHT MAMMOGRAM POST ULTRASOUND BIOPSY

[Series 1: us breast bx w/ loc dev 1st lesion img bx spec us  · 0.06mm/px · 12 of 12 slices shown]
[im 1/12]
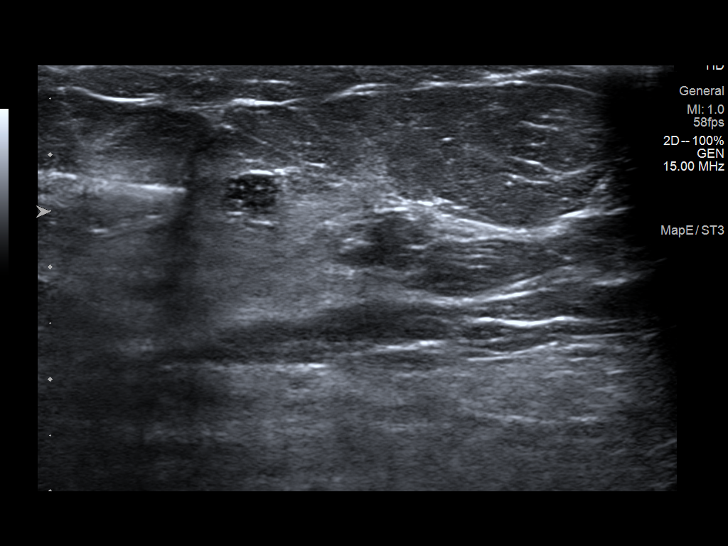
[im 2/12]
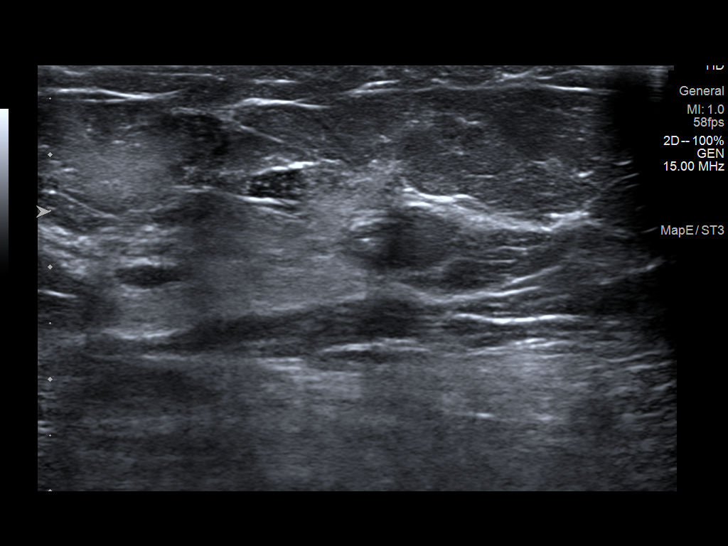
[im 3/12]
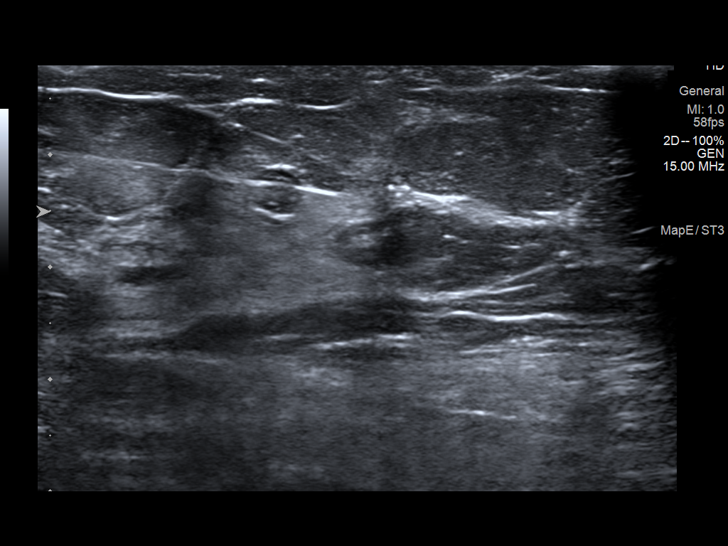
[im 4/12]
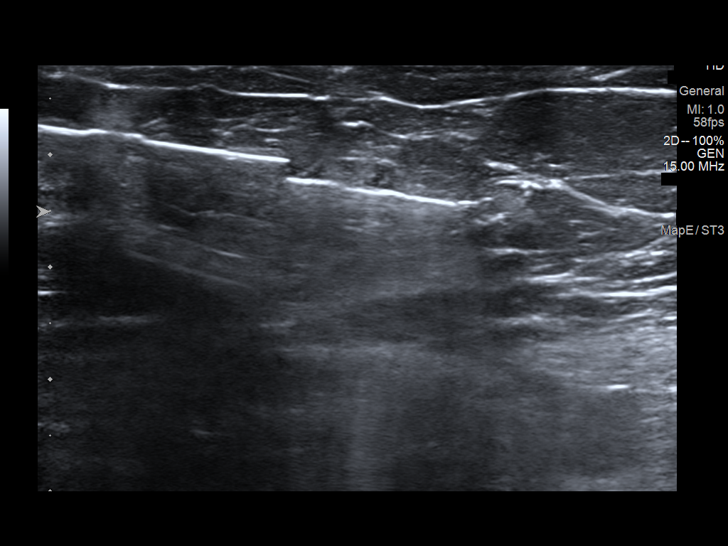
[im 5/12]
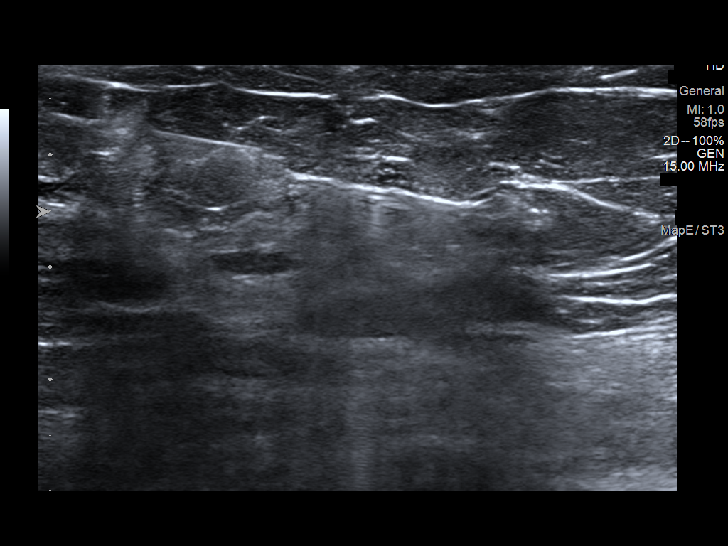
[im 6/12]
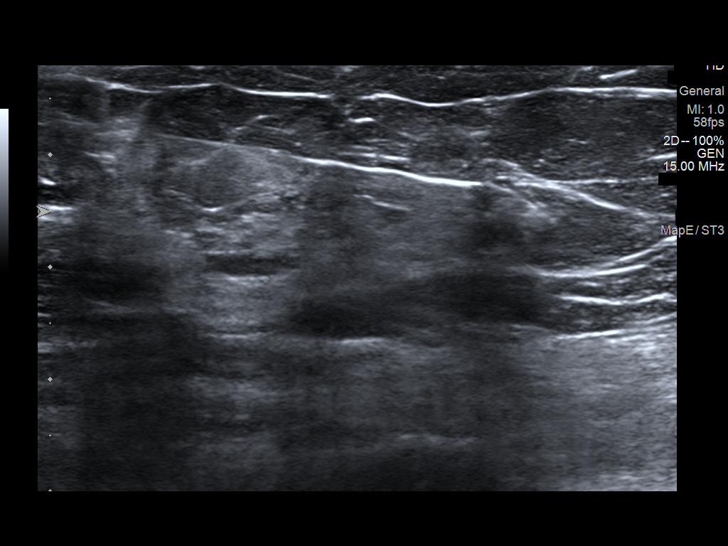
[im 7/12]
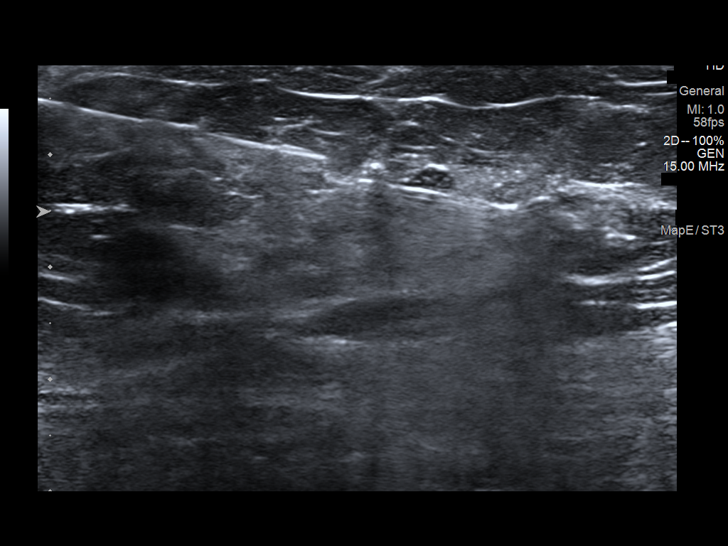
[im 8/12]
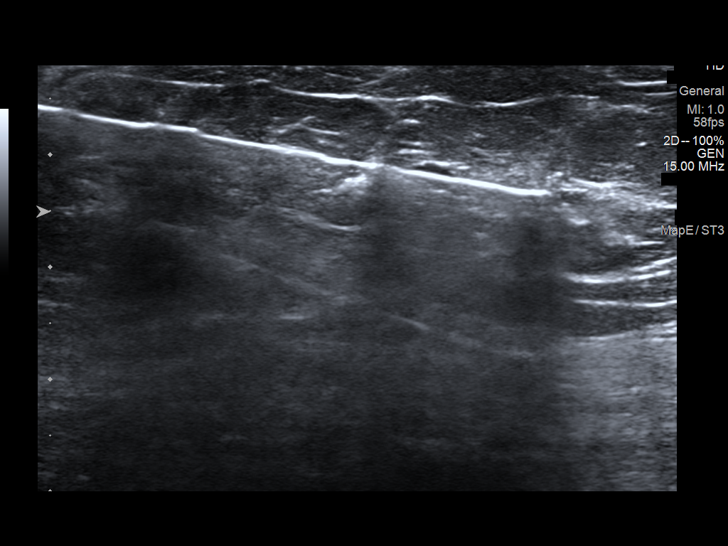
[im 9/12]
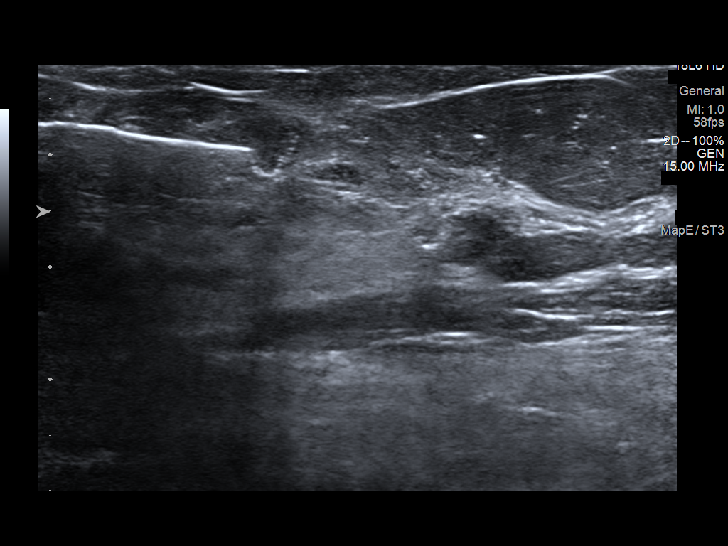
[im 10/12]
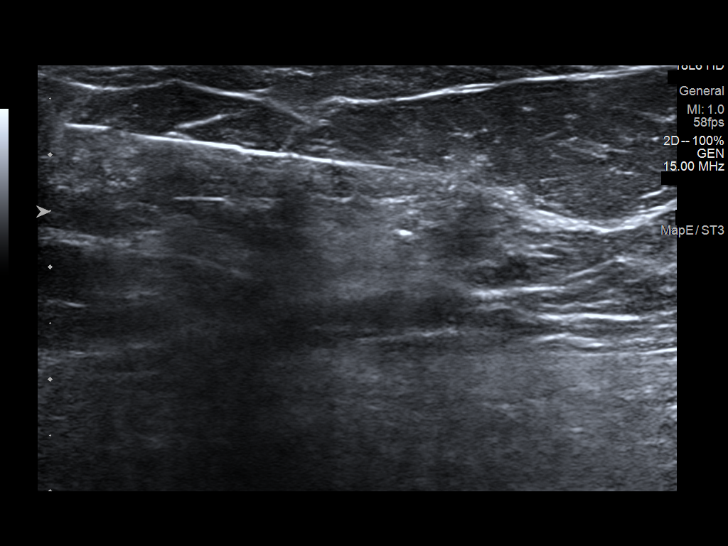
[im 11/12]
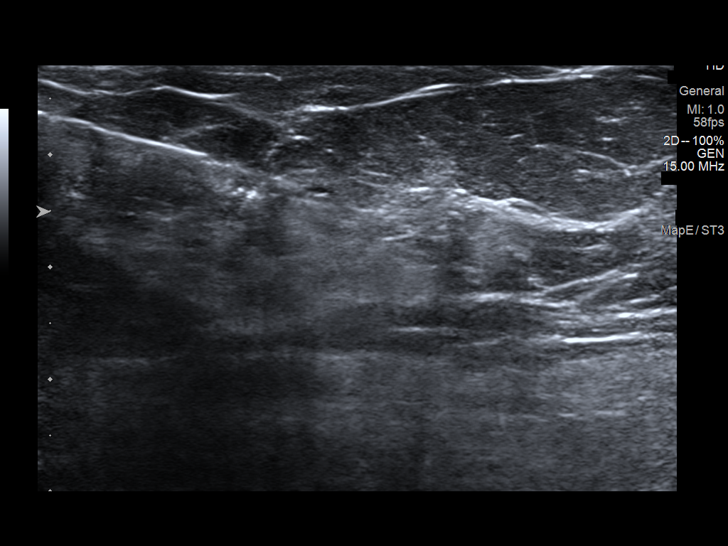
[im 12/12]
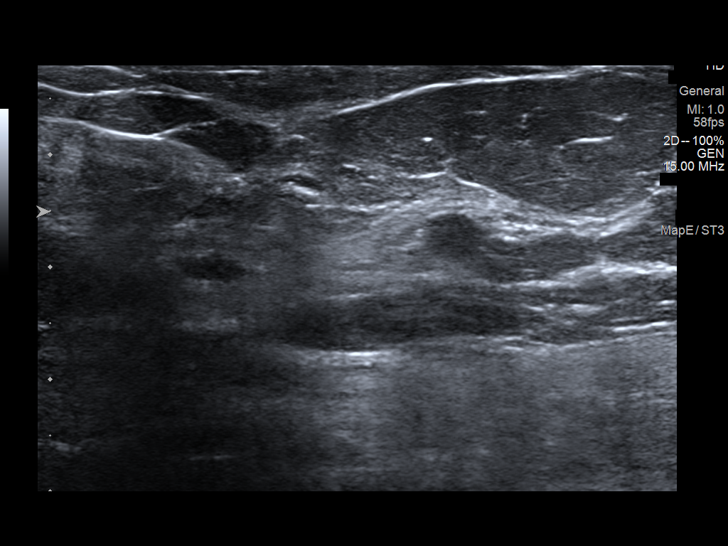

[12 of 12 positions shown; findings below may reference images not displayed]



Lesion quadrant: UPPER-OUTER RIGHT breast

Using sterile technique and 1% Lidocaine as local anesthetic, under
direct ultrasound visualization, a 14 gauge Leida device was
used to perform biopsy of the 0.6 cm hypoechoic mass at the 10
o'clock position of the RIGHT breast 10 cm from the nipple using a
LATERAL approach. At the conclusion of the procedure RIBBON tissue
marker clip was deployed into the biopsy cavity.

Follow up 2 view mammogram was performed demonstrating the RIBBON
clip to be in satisfactory position at the expected site of biopsy
within the UPPER-OUTER RIGHT breast. This also corresponds to the
mass identified on the screening study.
IMPRESSION: Ultrasound guided biopsy of 0.6 cm UPPER-OUTER RIGHT breast mass. No
apparent complications.

Satisfactory position of RIBBON clip following ultrasound-guided
RIGHT breast biopsy and corresponding to the mass identified
mammographically

ADDENDUM:
Pathology revealed BENIGN REACTIVE LYMPH NODE of the RIGHT breast,
upper outer 10 o'clock position. This was found to be concordant by
Dr. Hanz Fyffe.

Pathology results were discussed with the patient by telephone. The
patient reported doing well after the biopsy with tenderness at the
site. Post biopsy instructions and care were reviewed and questions
were answered. The patient was encouraged to call The [REDACTED]

The patient was instructed to return for annual screening
mammography and informed a reminder notice would be sent regarding
this appointment.

Pathology results reported by Giichi Rautela, RN on 09/07/2019.



Lesion quadrant: UPPER-OUTER RIGHT breast

Using sterile technique and 1% Lidocaine as local anesthetic, under
direct ultrasound visualization, a 14 gauge Leida device was
used to perform biopsy of the 0.6 cm hypoechoic mass at the 10
o'clock position of the RIGHT breast 10 cm from the nipple using a
LATERAL approach. At the conclusion of the procedure RIBBON tissue
marker clip was deployed into the biopsy cavity.

Follow up 2 view mammogram was performed demonstrating the RIBBON
clip to be in satisfactory position at the expected site of biopsy
within the UPPER-OUTER RIGHT breast. This also corresponds to the
mass identified on the screening study.
IMPRESSION: Ultrasound guided biopsy of 0.6 cm UPPER-OUTER RIGHT breast mass. No
apparent complications.

Satisfactory position of RIBBON clip following ultrasound-guided
RIGHT breast biopsy and corresponding to the mass identified
mammographically

## 2020-01-08 ENCOUNTER — Ambulatory Visit: Payer: Medicare Other | Attending: Internal Medicine

## 2020-01-08 DIAGNOSIS — Z23 Encounter for immunization: Secondary | ICD-10-CM

## 2020-01-08 NOTE — Progress Notes (Signed)
   Covid-19 Vaccination Clinic  Name:  Cynthia Herrera    MRN: [id number] DOB: Jan 02, 1946  01/08/2020  [name] was observed post Covid-19 immunization for 15 minutes without incident. She was provided with Vaccine Information Sheet and instruction to access the V-Safe system.   [name] was instructed to call 911 with any severe reactions post vaccine: Marland Kitchen Difficulty breathing  . Swelling of face and throat  . A fast heartbeat  . A bad rash all over body  . Dizziness and weakness   Immunizations Administered    Name Date Dose VIS Date Route   Pfizer COVID-19 Vaccine 01/08/2020 12:03 PM 0.3 mL 10/05/2019 Intramuscular   Manufacturer: [place]   Lot: [id number]   [place]: [id number]

## 2020-01-24 ENCOUNTER — Other Ambulatory Visit: Payer: Self-pay | Admitting: Podiatry

## 2020-01-26 ENCOUNTER — Other Ambulatory Visit: Payer: Self-pay | Admitting: Internal Medicine

## 2020-01-29 ENCOUNTER — Other Ambulatory Visit: Payer: Self-pay | Admitting: Internal Medicine

## 2020-02-12 ENCOUNTER — Encounter: Payer: Self-pay | Admitting: Internal Medicine

## 2020-02-12 ENCOUNTER — Ambulatory Visit (INDEPENDENT_AMBULATORY_CARE_PROVIDER_SITE_OTHER): Payer: Medicare Other | Admitting: Internal Medicine

## 2020-02-12 ENCOUNTER — Other Ambulatory Visit: Payer: Self-pay

## 2020-02-12 VITALS — BP 110/70 | HR 66 | Temp 97.4°F | Wt 233.9 lb

## 2020-02-12 DIAGNOSIS — I70209 Unspecified atherosclerosis of native arteries of extremities, unspecified extremity: Secondary | ICD-10-CM

## 2020-02-12 DIAGNOSIS — I4819 Other persistent atrial fibrillation: Secondary | ICD-10-CM

## 2020-02-12 DIAGNOSIS — I10 Essential (primary) hypertension: Secondary | ICD-10-CM

## 2020-02-12 DIAGNOSIS — E785 Hyperlipidemia, unspecified: Secondary | ICD-10-CM

## 2020-02-12 DIAGNOSIS — E1151 Type 2 diabetes mellitus with diabetic peripheral angiopathy without gangrene: Secondary | ICD-10-CM | POA: Diagnosis not present

## 2020-02-12 DIAGNOSIS — Z7901 Long term (current) use of anticoagulants: Secondary | ICD-10-CM

## 2020-02-12 DIAGNOSIS — Z853 Personal history of malignant neoplasm of breast: Secondary | ICD-10-CM

## 2020-02-12 LAB — POCT GLYCOSYLATED HEMOGLOBIN (HGB A1C): Hemoglobin A1C: 6.7 % — AB (ref 4.0–5.6)

## 2020-02-12 NOTE — Progress Notes (Signed)
Established Patient Office Visit     This visit occurred during the SARS-CoV-2 public health emergency.  Safety protocols were in place, including screening questions prior to the visit, additional usage of staff PPE, and extensive cleaning of exam room while observing appropriate contact time as indicated for disinfecting solutions.    CC/Reason for Visit: 100-month follow-up chronic medical conditions  HPI: Cynthia Herrera is a 74 y.o. female who is coming in today for the above mentioned reasons. Past Medical History is significant for: hx of breast cas/p left mastectomy, arthritis,chronic combinedCHF, COPD, hypertension, hyperlipidemia, OSA, osteopenia, thyroid nodule and DM II, also chronic A. fib.  She has no acute complaints today.  She has been feeling quite well since her last visit.  As instructed, she increased her Metformin to twice daily.  She has had both of her Covid vaccines.  She has had her routine follow-up with her cardiologist.   Past Medical/Surgical History: Past Medical History:  Diagnosis Date  . Arthritis   . Breast cancer, left breast ([place]) 2002   "then treated w/chemo and radiation"  . Breast cancer, left breast ([place]) 07/14/2017   "tx'd w/mastectomy"  . Chronic back pain    h/o lumbar stenosis; "no problem since my OR" (`10/27/2017)  . Chronic systolic CHF (congestive heart failure) ([place])   . Complication of anesthesia    last surgery iv med when going to sleep"burned as injected"  . COPD (chronic obstructive pulmonary disease) ([facility])    no inhalers   . DCM (dilated cardiomyopathy) ([place])    EF 15-20% ? tachycardia induced - EF 35-40% by echo 05/2018  . Dyspnea    with exertion  . Gallstones   . GERD (gastroesophageal reflux disease)    once in a while;depends on what she eats  . History of bronchitis    "chronic when I smoked; no problem since I quit in 2002" (10/27/2017)  . Hyperlipidemia   . Hypertension    takes Losartan and Metoprolol.   .  Joint pain   . Muscle spasm    takes Flexeril daily as needed   . Nocturia   . OSA (obstructive sleep apnea) 08/22/2018   Severe obstructive sleep apnea with an AHI 47.3/h and no significant central sleep apnea with moderate oxygen desaturations as low as 79%  . Osteopenia   . Persistent atrial fibrillation ([place])    s/p TEE DCCV and repeat DCCV 11/14/2013 and 02/2018  . Personal history of colonic polyps    adenomas 03 and 08  . Pneumonia    hx  . Seasonal allergies   . Thyroid nodule   . Type II diabetes mellitus ([place])   . Vitamin D deficiency    takes Vit D    Past Surgical History:  Procedure Laterality Date  . ATRIAL FIBRILLATION ABLATION N/A 12/12/2018   Procedure: ATRIAL FIBRILLATION ABLATION;  Surgeon: Thompson Grayer, MD;  Location: [facility];  Service: Cardiovascular;  Laterality: N/A;  . AXILLARY LYMPH NODE DISSECTION Left 10/2001   persistent intramammary node Archie Endo 03/09/2011  . BACK SURGERY    . BREAST BIOPSY Left 2002  . BREAST BIOPSY Right 06/2017   "node bx was negative"  . BREAST BIOPSY Left 06/2017   "positive for cancer"  . BREAST LUMPECTOMY WITH RADIOACTIVE SEED LOCALIZATION Right 08/15/2017   Procedure: RIGHT BREAST LUMPECTOMY WITH RADIOACTIVE SEED LOCALIZATION;  Surgeon: Jovita Kussmaul, MD;  Location: [facility];  Service: General;  Laterality: Right;  . CARDIAC  CATHETERIZATION  2015  . CARDIOVERSION N/A 11/12/2013   Procedure: CARDIOVERSION;  Surgeon: [place] Headings, MD;  Location: [facility] ENDOSCOPY;  Service: Cardiovascular;  Laterality: N/A;  10:08  Dr. Marissa Nestle, anesthesia present, [place]   60mg ,  propofol 50mg , IV for elective cardioversion....Dr. Cathie Olden delievered synch 120 joules with successful cardioversion to NSR  . CARDIOVERSION N/A 11/12/2013   Procedure: CARDIOVERSION;  Surgeon: [place] Headings, MD;  Location: [facility];  Service: Cardiovascular;  Laterality: N/A;  . CARDIOVERSION N/A 11/14/2013   Procedure: CARDIOVERSION (BEDSIDE);  Surgeon:  Larey Dresser, MD;  Location: [facility];  Service: Cardiovascular;  Laterality: N/A;  . CARDIOVERSION N/A 02/22/2018   Procedure: CARDIOVERSION;  Surgeon: Sueanne Margarita, MD;  Location: [facility] ENDOSCOPY;  Service: Cardiovascular;  Laterality: N/A;  . CATARACT EXTRACTION W/ INTRAOCULAR LENS  IMPLANT, BILATERAL Bilateral ~ 2016  . COLONOSCOPY  2003, September 2008, April 01, 2011   adenomas 03 and 08, polyp 12, diverticulosis  . COLONOSCOPY WITH PROPOFOL N/A 11/16/2016   Procedure: COLONOSCOPY WITH PROPOFOL;  Surgeon: Gatha Mayer, MD;  Location: [facility] ENDOSCOPY;  Service: Endoscopy;  Laterality: N/A;  . JOINT REPLACEMENT    . LAPAROSCOPIC CHOLECYSTECTOMY  2004  . LEFT AND RIGHT HEART CATHETERIZATION WITH CORONARY ANGIOGRAM N/A 02/01/2014   Procedure: LEFT AND RIGHT HEART CATHETERIZATION WITH CORONARY ANGIOGRAM;  Surgeon: Sueanne Margarita, MD;  Location: [facility] CATH LAB;  Service: Cardiovascular;  Laterality: N/A;  . LUMBAR LAMINECTOMY/DECOMPRESSION MICRODISCECTOMY N/A 03/19/2014   Procedure: LUMBAR LAMINECTOMY/DECOMPRESSION MICRODISCECTOMY 4 LEVEL;  Surgeon: Kristeen Miss, MD;  Location: [facility]URO [facility]S;  Service: Neurosurgery;  Laterality: N/A;  L1-2 L2-3 L3-4 L4-5 Laminectomy/Foraminotomy  . MASS EXCISION Left 08/15/2017   Procedure: EXCISION OF LEFT BREAST MASS;  Surgeon: Jovita Kussmaul, MD;  Location: [facility];  Service: General;  Laterality: Left;  Marland Kitchen MASTECTOMY Left 10/27/2017  . MASTECTOMY PARTIAL / LUMPECTOMY W/ AXILLARY LYMPHADENECTOMY  05/08/2001   Archie Endo 03/09/2011  . PORT-A-CATH REMOVAL  2003  . PORTA CATH INSERTION  2002  . TEE WITHOUT CARDIOVERSION N/A 11/12/2013   Procedure: TRANSESOPHAGEAL ECHOCARDIOGRAM (TEE);  Surgeon: [place] Headings, MD;  Location: [facility];  Service: Cardiovascular;  Laterality: N/A;  pt b/p low, pt buccal membranes very dry, lips scapped, pt c/o thirst. NPO since MN and iv FLUIDS TOTAL INFUSING AT TOTAL 20ML HR....  Dr. Cathie Olden order allow NS to bolus during procedure....very  dry,NS bolus 250 ml total..pt responding well to meds..  . TOTAL KNEE ARTHROPLASTY Left 2006  . TOTAL KNEE ARTHROPLASTY Right 05/10/2016   Procedure: RIGHT TOTAL KNEE ARTHROPLASTY;  Surgeon: Gaynelle Arabian, MD;  Location: [facility];  Service: Orthopedics;  Laterality: Right;  With adductor block  . TOTAL MASTECTOMY Left 10/27/2017   Procedure: LEFT MASTECTOMY;  Surgeon: Jovita Kussmaul, MD;  Location: [facility];  Service: General;  Laterality: Left;  . TUBAL LIGATION      Social History:  reports that she quit smoking about 18 years ago. Her smoking use included cigarettes. She has a 52.50 pack-year smoking history. She has never used smokeless tobacco. She reports that she does not drink alcohol or use drugs.  Allergies: Allergies  Allergen Reactions  . Tikosyn [Dofetilide]     Prolonged QT/QTc  . Zolpidem Other (See Comments)    Hallucinations, up walking around ? Hallucinations, up walking around  . Codeine Other (See Comments)  . Codeine Phosphate Nausea And Vomiting  . Methylprednisolone Other (See Comments)    Felt really weird w the high  dose oral steroid, but tolerates low doses or oral steroid Felt really weird w the high dose oral steroid, but tolerates low doses or oral steroid    Family History:  Family History  Problem Relation Age of Onset  . Dementia Sister   . Diabetes Sister   . Alzheimer's disease Sister   . Diabetes Brother        x 2  . Heart disease Brother   . Irritable bowel syndrome Brother   . Liver cancer Mother   . Diabetes Mother   . Pancreatic cancer Mother   . Diabetes Father   . Heart disease Father      Current Outpatient Medications:  .  amoxicillin (AMOXIL) 500 MG capsule, Take 2,000 mg by mouth See admin instructions. Take 2000 mg by mouth 1 hour prior to dental appointment, Disp: , Rfl: 5 .  atorvastatin (LIPITOR) 40 MG tablet, TAKE 1 TABLET BY MOUTH EVERYDAY AT BEDTIME, Disp: 90 tablet, Rfl: 3 .  BYSTOLIC 2.5 MG tablet, TAKE 1 TABLET BY MOUTH  EVERY DAY, Disp: 30 tablet, Rfl: 0 .  Cholecalciferol (VITAMIN D3) 2000 units capsule, Take 2,000 Units by mouth daily., Disp: , Rfl:  .  clotrimazole-betamethasone (LOTRISONE) cream, Apply 1 application topically 2 (two) times daily., Disp: 30 g, Rfl: 0 .  fexofenadine (ALLEGRA) 180 MG tablet, Take 180 mg by mouth daily as needed for allergies or rhinitis., Disp: , Rfl:  .  fluconazole (DIFLUCAN) 150 MG tablet, Take 1 tablet (150 mg total) by mouth once a week., Disp: 6 tablet, Rfl: 2 .  fluticasone (FLONASE) 50 MCG/ACT nasal spray, Place 1 spray into both nostrils daily as needed for allergies. , Disp: , Rfl:  .  furosemide (LASIX) 20 MG tablet, TAKE 1 TABLET BY MOUTH TWICE A DAY, Disp: 180 tablet, Rfl: 2 .  gabapentin (NEURONTIN) 300 MG capsule, TAKE 1 CAPSULE BY MOUTH EVERYDAY AT BEDTIME, Disp: 30 capsule, Rfl: 0 .  glucose blood test strip, Use to check CBG AC and QHS., Disp: 200 each, Rfl: 12 .  KLOR-CON M20 20 MEQ tablet, TAKE 1 TABLET BY MOUTH EVERY DAY, Disp: 90 tablet, Rfl: 2 .  letrozole (FEMARA) 2.5 MG tablet, TAKE 1 TABLET BY MOUTH EVERY DAY, Disp: 90 tablet, Rfl: 3 .  losartan (COZAAR) 25 MG tablet, TAKE 1 TABLET BY MOUTH TWICE A DAY, Disp: 180 tablet, Rfl: 0 .  metFORMIN (GLUCOPHAGE) 500 MG tablet, Take 1 tablet (500 mg total) by mouth 2 (two) times daily with a meal., Disp: 180 tablet, Rfl: 1 .  pantoprazole (PROTONIX) 40 MG tablet, TAKE 1 TABLET BY MOUTH EVERY DAY, Disp: 90 tablet, Rfl: 3 .  spironolactone (ALDACTONE) 25 MG tablet, TAKE 1 TABLET BY MOUTH EVERY DAY, Disp: 90 tablet, Rfl: 2 .  traMADol (ULTRAM) 50 MG tablet, Take 50 mg by mouth every 6 (six) hours as needed for moderate pain., Disp: , Rfl:  .  XARELTO 20 MG TABS tablet, TAKE 1 TABLET BY MOUTH EVERY DAY WITH SUPPER, Disp: 90 tablet, Rfl: 1  Review of Systems:  Constitutional: Denies fever, chills, diaphoresis, appetite change and fatigue.  HEENT: Denies photophobia, eye pain, redness, hearing loss, ear pain,  congestion, sore throat, rhinorrhea, sneezing, mouth sores, trouble swallowing, neck pain, neck stiffness and tinnitus.   Respiratory: Denies SOB, DOE, cough, chest tightness,  and wheezing.   Cardiovascular: Denies chest pain, palpitations and leg swelling.  Gastrointestinal: Denies nausea, vomiting, abdominal pain, diarrhea, constipation, blood in stool and abdominal distention.  Genitourinary:  Denies dysuria, urgency, frequency, hematuria, flank pain and difficulty urinating.  Endocrine: Denies: hot or cold intolerance, sweats, changes in hair or nails, polyuria, polydipsia. Musculoskeletal: Denies myalgias, back pain, joint swelling, arthralgias and gait problem.  Skin: Denies pallor, rash and wound.  Neurological: Denies dizziness, seizures, syncope, weakness, light-headedness, numbness and headaches.  Hematological: Denies adenopathy. Easy bruising, personal or family bleeding history  Psychiatric/Behavioral: Denies suicidal ideation, mood changes, confusion, nervousness, sleep disturbance and agitation    Physical Exam: Vitals:   02/12/20 1127  BP: 110/70  Pulse: 66  Temp: (!) 97.4 F (36.3 C)  TempSrc: Temporal  SpO2: 97%  Weight: 233 lb 14.4 oz (106.1 kg)    Body mass index is 37.75 kg/m.   Constitutional: NAD, calm, comfortable Eyes: PERRL, lids and conjunctivae normal ENMT: Mucous membranes are moist. Respiratory: clear to auscultation bilaterally, no wheezing, no crackles. Normal respiratory effort. No accessory muscle use.  Cardiovascular: Regular rate and rhythm, no murmurs / rubs / gallops. No extremity edema.  Neurologic: Grossly intact and nonfocal Psychiatric: Normal judgment and insight. Alert and oriented x 3. Normal mood.    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  History of left breast cancer -Noted  Essential  hypertension -Well-controlled  Dyslipidemia -Last LDL was 56 in 2019.  Persistent atrial fibrillation (HCC) Chronic anticoagulation- Xarelto -Rate controlled, followed by cardiology.     Lelon Frohlich, MD [facility]

## 2020-02-21 ENCOUNTER — Telehealth: Payer: Self-pay | Admitting: *Deleted

## 2020-02-21 MED ORDER — FLUCONAZOLE 150 MG PO TABS: 150.0000 mg | ORAL_TABLET | ORAL | 2 refills | Status: DC

## 2020-02-21 NOTE — Telephone Encounter (Signed)
Pt requested refill of the diflucan for the breaking out of her feet since it is summertime.

## 2020-02-21 NOTE — Telephone Encounter (Signed)
I spoke with Cynthia Herrera and informed of Dr. Eleanora Neighbor orders and to come in to be evaluated if problem continues. Cynthia Herrera states she doesn't take the medication over there 4 weeks but may space out. I told Cynthia Herrera she may find that if she takes the 1st course as directed then takes 1 pill every other week that may work better and to call with any concerns. Cynthia Herrera states understanding.

## 2020-02-21 NOTE — Telephone Encounter (Signed)
Dr. Randie Heinz refill and if pt continues to have a problem she should make an appt.

## 2020-02-22 ENCOUNTER — Other Ambulatory Visit: Payer: Self-pay | Admitting: Internal Medicine

## 2020-02-22 NOTE — Telephone Encounter (Signed)
Xarelto 20mg  refill request received. Pt is 74 years old, weight-106.1kg, Crea-1.14 on 01/01/2020, last seen by Dr.Turner on 12/28/2019, Diagnosis-Afib, CrCl-72.25ml/min; Dose is appropriate based on dosing criteria. Will send in refill to requested pharmacy.

## 2020-02-25 ENCOUNTER — Other Ambulatory Visit: Payer: Self-pay | Admitting: Internal Medicine

## 2020-02-29 ENCOUNTER — Other Ambulatory Visit: Payer: Self-pay | Admitting: Podiatry

## 2020-03-10 DIAGNOSIS — C50912 Malignant neoplasm of unspecified site of left female breast: Secondary | ICD-10-CM | POA: Diagnosis not present

## 2020-03-12 ENCOUNTER — Telehealth: Payer: Self-pay | Admitting: Internal Medicine

## 2020-03-12 DIAGNOSIS — C50912 Malignant neoplasm of unspecified site of left female breast: Secondary | ICD-10-CM

## 2020-03-12 DIAGNOSIS — I10 Essential (primary) hypertension: Secondary | ICD-10-CM

## 2020-03-12 DIAGNOSIS — E785 Hyperlipidemia, unspecified: Secondary | ICD-10-CM

## 2020-03-12 DIAGNOSIS — Z7901 Long term (current) use of anticoagulants: Secondary | ICD-10-CM

## 2020-03-12 DIAGNOSIS — I70209 Unspecified atherosclerosis of native arteries of extremities, unspecified extremity: Secondary | ICD-10-CM

## 2020-03-12 NOTE — Chronic Care Management (AMB) (Signed)
  Chronic Care Management   Note  03/12/2020 Name: PENCIE DEBS MRN: [id number] DOB: 02-23-1946  KYLENN KUCHTA is a 74 y.o. year old female who is a primary care patient of Isaac Bliss, Rayford Halsted, MD. I reached out to [name] by phone today in response to a referral sent by Ms. Grayland Ormond Valentine's PCP, Isaac Bliss, Rayford Halsted, MD.   Ms. Beeler was given information about Chronic Care Management services today including:  1. CCM service includes personalized support from designated clinical staff supervised by her physician, including individualized plan of care and coordination with other care providers 2. 24/7 contact phone numbers for assistance for urgent and routine care needs. 3. Service will only be billed when office clinical staff spend 20 minutes or more in a month to coordinate care. 4. Only one practitioner may furnish and bill the service in a calendar month. 5. The patient may stop CCM services at any time (effective at the end of the month) by phone call to the office staff.   Patient agreed to services and verbal consent obtained.   Follow up plan:   [place]

## 2020-03-16 ENCOUNTER — Other Ambulatory Visit: Payer: Self-pay | Admitting: Internal Medicine

## 2020-03-17 ENCOUNTER — Other Ambulatory Visit: Payer: Self-pay

## 2020-03-17 ENCOUNTER — Telehealth (INDEPENDENT_AMBULATORY_CARE_PROVIDER_SITE_OTHER): Payer: Medicare Other | Admitting: Internal Medicine

## 2020-03-17 ENCOUNTER — Encounter: Payer: Self-pay | Admitting: Internal Medicine

## 2020-03-17 VITALS — Ht 66.0 in | Wt 232.0 lb

## 2020-03-17 DIAGNOSIS — I4819 Other persistent atrial fibrillation: Secondary | ICD-10-CM

## 2020-03-17 DIAGNOSIS — G4733 Obstructive sleep apnea (adult) (pediatric): Secondary | ICD-10-CM | POA: Diagnosis not present

## 2020-03-17 DIAGNOSIS — I428 Other cardiomyopathies: Secondary | ICD-10-CM

## 2020-03-17 DIAGNOSIS — D6869 Other thrombophilia: Secondary | ICD-10-CM

## 2020-03-17 NOTE — Progress Notes (Signed)
Electrophysiology TeleHealth Note  Due to national recommendations of social distancing due to [place] 19, an audio telehealth visit is felt to be most appropriate for this patient at this time.  Verbal consent was obtained by me for the telehealth visit today.  The patient does not have capability for a virtual visit.  A phone visit is therefore required today.   Date:  03/17/2020   ID:  Cynthia Herrera, DOB Mar 21, 1946, MRN [id number]  Location: patient's home  Provider location:  [facility]  Evaluation Performed: Follow-up visit  PCP:  Isaac Bliss, Rayford Halsted, MD   Electrophysiologist:  Dr Rayann Heman  Chief Complaint:  palpitations  History of Present Illness:    Cynthia Herrera is a 74 y.o. female who presents via telehealth conferencing today.  Since last being seen in our clinic, the patient reports doing very well.  Today, she denies symptoms of palpitations, chest pain, shortness of breath,  lower extremity edema, dizziness, presyncope, or syncope.  She has occasional unsteadiness when walking.  She describes this as occasional and mild.  The patient is otherwise without complaint today.     Past Medical History:  Diagnosis Date  . Arthritis   . Breast cancer, left breast ([place]) 2002   "then treated w/chemo and radiation"  . Breast cancer, left breast ([place]) 07/14/2017   "tx'd w/mastectomy"  . Chronic back pain    h/o lumbar stenosis; "no problem since my OR" (`10/27/2017)  . Chronic systolic CHF (congestive heart failure) ([place])   . Complication of anesthesia    last surgery iv med when going to sleep"burned as injected"  . COPD (chronic obstructive pulmonary disease) ([facility])    no inhalers   . DCM (dilated cardiomyopathy) ([place])    EF 15-20% ? tachycardia induced - EF 35-40% by echo 05/2018  . Dyspnea    with exertion  . Gallstones   . GERD (gastroesophageal reflux disease)    once in a while;depends on what she eats  . History of bronchitis    "chronic when I  smoked; no problem since I quit in 2002" (10/27/2017)  . Hyperlipidemia   . Hypertension    takes Losartan and Metoprolol.   . Joint pain   . Muscle spasm    takes Flexeril daily as needed   . Nocturia   . OSA (obstructive sleep apnea) 08/22/2018   Severe obstructive sleep apnea with an AHI 47.3/h and no significant central sleep apnea with moderate oxygen desaturations as low as 79%  . Osteopenia   . Persistent atrial fibrillation ([place])    s/p TEE DCCV and repeat DCCV 11/14/2013 and 02/2018  . Personal history of colonic polyps    adenomas 03 and 08  . Pneumonia    hx  . Seasonal allergies   . Thyroid nodule   . Type II diabetes mellitus ([place] [facility])   . Vitamin D deficiency    takes Vit D    Past Surgical History:  Procedure Laterality Date  . ATRIAL FIBRILLATION ABLATION N/A 12/12/2018   Procedure: ATRIAL FIBRILLATION ABLATION;  Surgeon: Thompson Grayer, MD;  Location: [facility];  Service: Cardiovascular;  Laterality: N/A;  . AXILLARY LYMPH NODE DISSECTION Left 10/2001   persistent intramammary node Archie Endo 03/09/2011  . BACK SURGERY    . BREAST BIOPSY Left 2002  . BREAST BIOPSY Right 06/2017   "node bx was negative"  . BREAST BIOPSY Left 06/2017   "positive for cancer"  . BREAST LUMPECTOMY WITH RADIOACTIVE  SEED LOCALIZATION Right 08/15/2017   Procedure: RIGHT BREAST LUMPECTOMY WITH RADIOACTIVE SEED LOCALIZATION;  Surgeon: Jovita Kussmaul, MD;  Location: [facility];  Service: General;  Laterality: Right;  . CARDIAC CATHETERIZATION  2015  . CARDIOVERSION N/A 11/12/2013   Procedure: CARDIOVERSION;  Surgeon: Thayer Headings, MD;  Location: [facility] ENDOSCOPY;  Service: Cardiovascular;  Laterality: N/A;  10:08  Dr. Marissa Nestle, anesthesia present, [place]   60mg ,  propofol 50mg , IV for elective cardioversion....Dr. Cathie Olden delievered synch 120 joules with successful cardioversion to NSR  . CARDIOVERSION N/A 11/12/2013   Procedure: CARDIOVERSION;  Surgeon: Thayer Headings, MD;  Location: [facility];   Service: Cardiovascular;  Laterality: N/A;  . CARDIOVERSION N/A 11/14/2013   Procedure: CARDIOVERSION (BEDSIDE);  Surgeon: Larey Dresser, MD;  Location: [facility];  Service: Cardiovascular;  Laterality: N/A;  . CARDIOVERSION N/A 02/22/2018   Procedure: CARDIOVERSION;  Surgeon: Sueanne Margarita, MD;  Location: [facility] ENDOSCOPY;  Service: Cardiovascular;  Laterality: N/A;  . CATARACT EXTRACTION W/ INTRAOCULAR LENS  IMPLANT, BILATERAL Bilateral ~ 2016  . COLONOSCOPY  2003, September 2008, April 01, 2011   adenomas 03 and 08, polyp 12, diverticulosis  . COLONOSCOPY WITH PROPOFOL N/A 11/16/2016   Procedure: COLONOSCOPY WITH PROPOFOL;  Surgeon: Gatha Mayer, MD;  Location: [facility] ENDOSCOPY;  Service: Endoscopy;  Laterality: N/A;  . JOINT REPLACEMENT    . LAPAROSCOPIC CHOLECYSTECTOMY  2004  . LEFT AND RIGHT HEART CATHETERIZATION WITH CORONARY ANGIOGRAM N/A 02/01/2014   Procedure: LEFT AND RIGHT HEART CATHETERIZATION WITH CORONARY ANGIOGRAM;  Surgeon: Sueanne Margarita, MD;  Location: [facility] CATH LAB;  Service: Cardiovascular;  Laterality: N/A;  . LUMBAR LAMINECTOMY/DECOMPRESSION MICRODISCECTOMY N/A 03/19/2014   Procedure: LUMBAR LAMINECTOMY/DECOMPRESSION MICRODISCECTOMY 4 LEVEL;  Surgeon: Kristeen Miss, MD;  Location: [facility] NEURO ORS;  Service: Neurosurgery;  Laterality: N/A;  L1-2 L2-3 L3-4 L4-5 Laminectomy/Foraminotomy  . MASS EXCISION Left 08/15/2017   Procedure: EXCISION OF LEFT BREAST MASS;  Surgeon: Jovita Kussmaul, MD;  Location: [facility];  Service: General;  Laterality: Left;  Marland Kitchen MASTECTOMY Left 10/27/2017  . MASTECTOMY PARTIAL / LUMPECTOMY W/ AXILLARY LYMPHADENECTOMY  05/08/2001   Archie Endo 03/09/2011  . PORT-A-CATH REMOVAL  2003  . PORTA CATH INSERTION  2002  . TEE WITHOUT CARDIOVERSION N/A 11/12/2013   Procedure: TRANSESOPHAGEAL ECHOCARDIOGRAM (TEE);  Surgeon: Thayer Headings, MD;  Location: [facility];  Service: Cardiovascular;  Laterality: N/A;  pt b/p low, pt buccal membranes very dry, lips scapped, pt c/o thirst. NPO  since MN and iv FLUIDS TOTAL INFUSING AT TOTAL 20ML HR....  Dr. Cathie Olden order allow NS to bolus during procedure....very dry,NS bolus 250 ml total..pt responding well to meds..  . TOTAL KNEE ARTHROPLASTY Left 2006  . TOTAL KNEE ARTHROPLASTY Right 05/10/2016   Procedure: RIGHT TOTAL KNEE ARTHROPLASTY;  Surgeon: Gaynelle Arabian, MD;  Location: [facility];  Service: Orthopedics;  Laterality: Right;  With adductor block  . TOTAL MASTECTOMY Left 10/27/2017   Procedure: LEFT MASTECTOMY;  Surgeon: Jovita Kussmaul, MD;  Location: [place];  Service: General;  Laterality: Left;  . TUBAL LIGATION      Current Outpatient Medications  Medication Sig Dispense Refill  . amoxicillin (AMOXIL) 500 MG capsule Take 2,000 mg by mouth See admin instructions. Take 2000 mg by mouth 1 hour prior to dental appointment  5  . atorvastatin (LIPITOR) 40 MG tablet TAKE 1 TABLET BY MOUTH EVERYDAY AT BEDTIME 90 tablet 3  . BYSTOLIC 2.5 MG tablet TAKE 1 TABLET BY MOUTH EVERY DAY 90 tablet 1  .  Cholecalciferol (VITAMIN D3) 2000 units capsule Take 2,000 Units by mouth daily.    . clotrimazole-betamethasone (LOTRISONE) cream Apply 1 application topically 2 (two) times daily. 30 g 0  . fexofenadine (ALLEGRA) 180 MG tablet Take 180 mg by mouth daily as needed for allergies or rhinitis.    . fluconazole (DIFLUCAN) 150 MG tablet Take 1 tablet (150 mg total) by mouth once a week. 6 tablet 2  . fluticasone (FLONASE) 50 MCG/ACT nasal spray Place 1 spray into both nostrils daily as needed for allergies.     . furosemide (LASIX) 20 MG tablet TAKE 1 TABLET BY MOUTH TWICE A DAY 180 tablet 2  . gabapentin (NEURONTIN) 300 MG capsule TAKE 1 CAPSULE BY MOUTH EVERYDAY AT BEDTIME 30 capsule 0  . glucose blood test strip Use to check CBG AC and QHS. 200 each 12  . KLOR-CON M20 20 MEQ tablet TAKE 1 TABLET BY MOUTH EVERY DAY 90 tablet 2  . letrozole (FEMARA) 2.5 MG tablet TAKE 1 TABLET BY MOUTH EVERY DAY 90 tablet 3  . losartan (COZAAR) 25 MG tablet TAKE 1  TABLET BY MOUTH TWICE A DAY 180 tablet 3  . metFORMIN (GLUCOPHAGE) 500 MG tablet Take 1 tablet (500 mg total) by mouth 2 (two) times daily with a meal. 180 tablet 1  . pantoprazole (PROTONIX) 40 MG tablet TAKE 1 TABLET BY MOUTH EVERY DAY 90 tablet 3  . spironolactone (ALDACTONE) 25 MG tablet TAKE 1 TABLET BY MOUTH EVERY DAY 90 tablet 2  . traMADol (ULTRAM) 50 MG tablet Take 50 mg by mouth every 6 (six) hours as needed for moderate pain.    Marland Kitchen XARELTO 20 MG TABS tablet TAKE 1 TABLET BY MOUTH EVERY DAY WITH SUPPER 30 tablet 5   No current facility-administered medications for this visit.    Allergies:   Tikosyn [dofetilide], Zolpidem, Codeine, Codeine phosphate, and Methylprednisolone   Social History:  The patient  reports that she quit smoking about 18 years ago. Her smoking use included cigarettes. She has a 52.50 pack-year smoking history. She has never used smokeless tobacco. She reports that she does not drink alcohol or use drugs.   ROS:  Please see the history of present illness.   All other systems are personally reviewed and negative.    Exam:    Vital Signs:  Ht 5\' 6"  (1.676 m)   Wt 232 lb (105.2 kg)   BMI 37.45 kg/m   Well sounding, alert and conversant   Labs/Other Tests and Data Reviewed:    Recent Labs: 01/01/2020: ALT 13; BUN 21; Creatinine, Ser 1.14; Hemoglobin 12.0; Platelets 306; Potassium 4.4; Sodium 140   Wt Readings from Last 3 Encounters:  03/17/20 232 lb (105.2 kg)  02/12/20 233 lb 14.4 oz (106.1 kg)  12/28/19 236 lb (107 kg)       ASSESSMENT & PLAN:    1.  Persistent afib Maintaining sinus rhythm post ablation off amiodarone chads2vasc score is 5.  She is on xarelto  2. OSA Uses dental appliance  3. Chronic diastolic dysfunction/ nonischemic CM EF 45% Reduce lasix to 20mg  daily Daily weights and sodium restriction  4. Hypertensive cardiovascular disease Stable No change required today  5. Obesity Body mass index is 37.45 kg/m. Lifestyle  modification advised  Risks, benefits and potential toxicities for medications prescribed and/or refilled reviewed with patient today.   Follow-up:  6 months with me  (she sees Dr Radford Pax in September)   Patient Risk:  after full review of this  patients clinical status, I feel that they are at moderate risk at this time.  Today, I have spent 15 minutes with the patient with telehealth technology discussing arrhythmia management .    Army Fossa, MD  03/17/2020 2:55 PM     [place] [address] [place] [address] [phone] (office) [phone] (fax)

## 2020-03-26 ENCOUNTER — Other Ambulatory Visit: Payer: Self-pay | Admitting: Cardiology

## 2020-04-02 ENCOUNTER — Other Ambulatory Visit: Payer: Self-pay | Admitting: Podiatry

## 2020-04-24 ENCOUNTER — Ambulatory Visit: Payer: Medicare Other | Admitting: Pharmacist

## 2020-04-24 ENCOUNTER — Other Ambulatory Visit: Payer: Self-pay

## 2020-04-24 DIAGNOSIS — E785 Hyperlipidemia, unspecified: Secondary | ICD-10-CM

## 2020-04-24 DIAGNOSIS — E1151 Type 2 diabetes mellitus with diabetic peripheral angiopathy without gangrene: Secondary | ICD-10-CM

## 2020-04-24 DIAGNOSIS — I10 Essential (primary) hypertension: Secondary | ICD-10-CM

## 2020-04-24 NOTE — Chronic Care Management (AMB) (Signed)
Chronic Care Management Pharmacy  Name: Cynthia Herrera  MRN: [id number] DOB: 1945-11-04  Chief Complaint/ HPI  Cynthia Herrera,  74 y.o. , female presents for their Initial CCM visit with the clinical pharmacist via telephone due to COVID-19 Pandemic. Patient lives by herself and her dog. 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 Husband passed away due to stroke and they have been married for 79 years. Have 2 kids - son 43 yo and daughter that is 36 yo. Patient reports to having back pain especially when walking. She reports tramadol working really well, but she reports being out of it and she needs it refilled. Patient also reports gabapentin to be working well for her, but she is asking if we can have a referral to a neurologist concerning her neuropathy.  PCP : Isaac Bliss, Rayford Halsted, MD  Their chronic conditions include: HTN, HLD, chronic combined CHF, DM II, chronic Afib, hx of breast cancer s/p left mastectomy, arthritis, COPD, osteopenia  Office Visits: 02/12/20 OV - DM II well controlled at A1c of 6.7%. HTN well controlled. Needs new lipid panel. Last LDL was 56 in 2019. Afib followed by cardiology - rate controlled and on Xarelto.  Consult Visit: None  Medications: Outpatient Encounter Medications as of 04/24/2020  Medication Sig  . amoxicillin (AMOXIL) 500 MG capsule Take 2,000 mg by mouth See admin instructions. Take 2000 mg by mouth 1 hour prior to dental appointment  . atorvastatin (LIPITOR) 40 MG tablet TAKE 1 TABLET BY MOUTH EVERYDAY AT BEDTIME  . BYSTOLIC 2.5 MG tablet TAKE 1 TABLET BY MOUTH EVERY DAY  . Cholecalciferol (VITAMIN D3) 2000 units capsule Take 2,000 Units by mouth daily.  . clotrimazole-betamethasone (LOTRISONE) cream Apply 1 application topically 2 (two) times daily.  . fexofenadine (ALLEGRA) 180 MG tablet Take 180 mg by mouth daily as needed for allergies or rhinitis.  . fluconazole (DIFLUCAN) 150 MG tablet Take 1 tablet (150 mg total)  by mouth once a week.  . fluticasone (FLONASE) 50 MCG/ACT nasal spray Place 1 spray into both nostrils daily as needed for allergies.   . furosemide (LASIX) 20 MG tablet TAKE 1 TABLET BY MOUTH TWICE A DAY  . gabapentin (NEURONTIN) 300 MG capsule TAKE 1 CAPSULE BY MOUTH EVERYDAY AT BEDTIME  . glucose blood test strip Use to check CBG AC and QHS.  Marland Kitchen KLOR-CON M20 20 MEQ tablet TAKE 1 TABLET BY MOUTH EVERY DAY  . letrozole (FEMARA) 2.5 MG tablet TAKE 1 TABLET BY MOUTH EVERY DAY  . losartan (COZAAR) 25 MG tablet TAKE 1 TABLET BY MOUTH TWICE A DAY  . metFORMIN (GLUCOPHAGE) 500 MG tablet Take 1 tablet (500 mg total) by mouth 2 (two) times daily with a meal.  . pantoprazole (PROTONIX) 40 MG tablet TAKE 1 TABLET BY MOUTH EVERY DAY  . spironolactone (ALDACTONE) 25 MG tablet TAKE 1 TABLET BY MOUTH EVERY DAY  . traMADol (ULTRAM) 50 MG tablet Take 50 mg by mouth every 6 (six) hours as needed for moderate pain.  Marland Kitchen XARELTO 20 MG TABS tablet TAKE 1 TABLET BY MOUTH EVERY DAY WITH SUPPER   No facility-administered encounter medications on file as of 04/24/2020.     Physical Activity: Inactive  . Days of Exercise per Week: 0 days  . Minutes of Exercise per Session: 0 min     Financial Resource Strain: Low Risk   . Difficulty of Paying Living Expenses: Not very hard    Current Diagnosis/Assessment:  Goals Addressed            This Visit's Progress   . Chronic Care Management       CARE PLAN ENTRY  Current Barriers:  . Chronic Disease Management support, education, and care coordination needs related to Hypertension, Hyperlipidemia, and Diabetes   Hypertension BP Readings from Last 3 Encounters:  02/12/20 110/70  12/28/19 124/60  11/15/19 118/70   . Pharmacist Clinical Goal(s): o Over the next 180 days, patient will work with PharmD and providers to maintain BP goal <140/90 . Current regimen:  . Losartan 25 mg 1 tablet twice daily . Spironolactone 25 mg 1 tablet daily . Furosemide 20 mg 1  tablet twice daily . Interventions: o Discussed low salt diet and exercising as tolerated extensively . Patient self care activities - Over the next 180 days, patient will: o Check BP once a week, document, and provide at future appointments o Ensure daily salt intake < 2300 mg/day  Hyperlipidemia Lab Results  Component Value Date/Time   LDLCALC 70 01/01/2020 11:43 AM   . Pharmacist Clinical Goal(s): o Over the next 180 days, patient will work with PharmD and providers to maintain LDL goal < 100 . Current regimen:  o Atorvastatin 40 mg 1 tablet daily at bedtime . Interventions: o Discussed low cholesterol diet and exercising as tolerated extensively . Patient self care activities - Over the next 180 days, patient will: o Continue with low cholesterol diet/weight management   Diabetes Lab Results  Component Value Date/Time   HGBA1C 6.7 (A) 02/12/2020 11:37 AM   HGBA1C 7.4 (A) 11/15/2019 02:00 PM   HGBA1C 6.2 (H) 01/02/2018 02:51 PM   HGBA1C 6.2 (H) 10/27/2017 08:06 AM   . Pharmacist Clinical Goal(s): o Over the next 180 days, patient will work with PharmD and providers to maintain A1c goal <7% . Current regimen:  o Metformin 500 mg 1 tablet twice daily with a meal . Interventions o Discussed carbohydrate counting and exercising as tolerated extensively . Patient self care activities - Over the next 180 days, patient will: o Check blood sugar in the morning before eating or drinking, document, and provide at future appointments o Contact provider with any episodes of hypoglycemia  Medication management . Pharmacist Clinical Goal(s): o Over the next 180 days, patient will work with PharmD and providers to maintain optimal medication adherence . Current pharmacy: [facility] . Interventions o Comprehensive medication review performed. o Continue current medication management strategy . Patient self care activities - Over the next 180 days, patient will: o Focus on  medication adherence by using weekly pill organizers o Take medications as prescribed o Report any questions or concerns to PharmD and/or provider(s)  Initial goal documentation        AFIB   Patient is currently rate controlled.  Patient has failed these meds in past: metoprolol, digoxin Patient is currently controlled on the following medications:   Bystolic 2.5 mg 1 tablet daily  Xarelto 20 mg 1 tablet daily with supper  Patient is well controlled and no problem with her heart rate. No severe bleeding and bruising reported.  Plan  Continue current medications   Heart Failure   Type: Combined Systolic and Diastolic  Last ejection fraction: 35-40% (05/2018) NYHA Class: II (slight limitation of activity) AHA HF Stage: C (Heart disease and symptoms present)  Patient has failed these meds in past: amiodarone, valsartan/hctz Patient is currently controlled on the following medications:   Spironolactone 25 mg 1 tablet daily  Furosemide 20 mg 1 tablet AM and every other night  Klor-con M20 1 tablet daily  Losartan 25 mg 1 tablet twice daily   Swelling is more controlled when taking once in the AM and once every other night. Patient was taking twice daily before, but told to reduce it since she was probably losing fluid than she should. She weighs herself daily.   Plan  Continue current medications    Hypertension   Office blood pressures are  BP Readings from Last 3 Encounters:  02/12/20 110/70  12/28/19 124/60  11/15/19 118/70    Patient has failed these meds in the past: metoprolol, valsartan/hctz Patient is currently controlled on the following medications:  . Losartan 25 mg 1 tablet twice daily . Spironolactone 25 mg 1 tablet daily . Furosemide 20 mg 1 tablet twice daily  Patient checks BP at home infrequently  BP is within goal and well controlled. Patient is doing well. We discussed about diet and weight management extensively. Denies exercising  daily due to severe back pain especially when moving around.  Plan  Continue current medications    Hyperlipidemia   Lipid Panel     Component Value Date/Time   CHOL 140 01/01/2020 1143   TRIG 159 (H) 01/01/2020 1143   HDL 43 01/01/2020 1143   CHOLHDL 3.3 01/01/2020 1143   CHOLHDL 3 02/03/2017 1207   VLDL 23.0 02/03/2017 1207   [place] 70 01/01/2020 1143   LABVLDL 27 01/01/2020 1143     The 10-year ASCVD risk score Mikey Bussing DC Jr., et al., 2013) is: 34.1%   Values used to calculate the score:     Age: 24 years     Sex: Female     Is Non-Hispanic African American: No     Diabetic: Yes     Tobacco smoker: No     Systolic Blood Pressure: 865 mmHg     Is BP treated: Yes     HDL Cholesterol: 43 mg/dL     Total Cholesterol: 140 mg/dL   Patient has failed these meds in past: simvastatin Patient is currently controlled on the following medications:  . Atorvastatin 40 mg 1 tablet daily at bedtime  Patient tries to eat healthy, but reports it could be better. Patient eats variety of food daily.   Plan  Continue current medications   Diabetes   Recent Relevant Labs: Lab Results  Component Value Date/Time   HGBA1C 6.7 (A) 02/12/2020 11:37 AM   HGBA1C 7.4 (A) 11/15/2019 02:00 PM   HGBA1C 6.2 (H) 01/02/2018 02:51 PM   HGBA1C 6.2 (H) 10/27/2017 08:06 AM   GFR 62.33 02/03/2017 12:07 PM   GFR 61.05 11/06/2015 09:59 AM   MICROALBUR 1.2 02/03/2017 12:07 PM   MICROALBUR 0.7 11/06/2015 09:59 AM     Checking BG: Rarely - when she remembers  Patient has failed these meds in past: glipizide, levemir   Patient is currently controlled on the following medications:  Marland Kitchen Metformin 500 mg 1 tablet twice daily with a meal  Usually eats vegetables. Diet varies but it could be better. Discussed diet/weight management and exercise. Patient had [place] last night. Patient denies routine exercise due to back pain.  Last diabetic Eye exam:  Lab Results  Component Value Date/Time    HMDIABEYEEXA Retinopathy (A) 07/24/2019 12:00 AM    Last diabetic Foot exam: No results found for: HMDIABFOOTEX   Plan  Continue current medications   Chronic Pain   Patient has failed these meds in past: none Patient  is currently controlled on the following medications:  . Gabapentin 300 mg 1 capsule at bedtime . Tramadol 50 mg 1 tablet every 6 hrs PRN for moderate pain  Gabapentin helped with neuropathy tremendously. Not completely gone, but it helped greatly. Patient is looking for a referral for neuropathic pain. Reports numbness on both feet, but mostly the left one more so. Tramadol helps with back pain. Patient reports severe back pain especially when walking. APAP doesn't help and would not want to try codeine. Patient reports muscle relaxants work especially after the back surgery.  Plan  Continue current medications   GERD   Patient has failed these meds in past: None Patient is currently controlled on the following medications:  . Pantoprazole 40 mg 1 tablet daily  Patient well controlled and denies any acid reflux or heart burn for the past days.   Plan  Continue current medications   Hx of Breat Cancer   Patient has failed these meds in past: None Patient is currently controlled on the following medications:  . Letrozole 2.5 mg 1 tablet daily  Patient reports to be stable. No complaints of side effects.   Plan  Continue current medications    Osteopenia    Vit D, 25-Hydroxy  Date Value Ref Range Status  12/10/2009 28 (L) 30 - 89 ng/mL Final    Comment:    This assay accurately quantifies Vitamin D, which is the sum of the25-Hydroxy forms of Vitamin D2 and D3.  Studies have shown that theoptimum concentration of 25-Hydroxy Vitamin D is 30 ng/mL or higher. Concentrations of Vitamin D between 20 and 29 ng/mL  are considered tobe insufficient and concentrations less than 20 ng/mL are consideredto be deficient for Vitamin D.    Patient has failed  these meds in past: None Patient is currently controlled on the following medications:  Marland Kitchen Vitamin D3 2000 units 1 tablet daily  DEXA scan still due. Will need to recheck Vit D levels to reassess.   Plan  Continue current medications   OTCs/Health Maintenance   Patient is currently controlled on the following medications: . Amoxil 500 mg 4 tablets by mouth prior to dental procedure . Clotrimazole/betamethasone cream apply twice daily . Allegra 180 mg 1 tablet daily PRN for allergies/rhinitis . Fluconazole 150 mg 1 tablet once a week . Fluticasone 50 mcg/act 1 spray into both nostril PRN for allergies  Patient reports allergies to be seasonal and   Plan  Continue current medications   Vaccines   Reviewed and discussed patient's vaccination history.    Immunization History  Administered Date(s) Administered  . Fluad Quad(high Dose 65+) 07/18/2019  . Influenza Split 09/29/2011, 07/20/2012  . Influenza Whole 08/14/2008  . Influenza, High Dose Seasonal PF 09/09/2017  . Influenza,inj,Quad PF,6+ Mos 10/23/2013, 08/20/2014, 08/14/2015, 08/27/2016, 08/09/2018  . PFIZER SARS-COV-2 Vaccination 12/15/2019, 01/08/2020  . Pneumococcal Conjugate-13 10/23/2013  . Pneumococcal Polysaccharide-23 06/18/2011  . Tdap 06/18/2011  . Zoster 07/20/2012    Plan  Vaccines are current.   Medication Management   Pharmacy[facility]Benefits: [facility] Adherence:  Pt endorses 100% compliance  Patient is adherent with her medications. Patient has a list of all of her medications and she reports taking them as directed.   Plan  Continue current medication management strategy   Follow up: 6 month phone visit   Geraldine Contras, PharmD Clinical Pharmacist [facility] [phone]

## 2020-04-24 NOTE — Addendum Note (Signed)
Addended by: Westley Hummer B on: 04/24/2020 09:58 AM   Modules accepted: Orders

## 2020-04-29 NOTE — Patient Instructions (Addendum)
Visit Information  Thank you for meeting with me to discuss your medications! I look forward to working with you to achieve your health care goals. Below is a summary of what we talked about during the visit:  Goals Addressed            This Visit's Progress    Chronic Care Management       CARE PLAN ENTRY  Current Barriers:   Chronic Disease Management support, education, and care coordination needs related to Hypertension, Hyperlipidemia, and Diabetes   Hypertension BP Readings from Last 3 Encounters:  02/12/20 110/70  12/28/19 124/60  11/15/19 118/70    Pharmacist Clinical Goal(s): o Over the next 180 days, patient will work with PharmD and providers to maintain BP goal <140/90  Current regimen:   Losartan 25 mg 1 tablet twice daily  Spironolactone 25 mg 1 tablet daily  Furosemide 20 mg 1 tablet twice daily  Interventions: o Discussed low salt diet and exercising as tolerated extensively  Patient self care activities - Over the next 180 days, patient will: o Check BP once a week, document, and provide at future appointments o Ensure daily salt intake < 2300 mg/day  Hyperlipidemia Lab Results  Component Value Date/Time   LDLCALC 70 01/01/2020 11:43 AM    Pharmacist Clinical Goal(s): o Over the next 180 days, patient will work with PharmD and providers to maintain LDL goal < 100  Current regimen:  o Atorvastatin 40 mg 1 tablet daily at bedtime  Interventions: o Discussed low cholesterol diet and exercising as tolerated extensively  Patient self care activities - Over the next 180 days, patient will: o Continue with low cholesterol diet/weight management   Diabetes Lab Results  Component Value Date/Time   HGBA1C 6.7 (A) 02/12/2020 11:37 AM   HGBA1C 7.4 (A) 11/15/2019 02:00 PM   HGBA1C 6.2 (H) 01/02/2018 02:51 PM   HGBA1C 6.2 (H) 10/27/2017 08:06 AM    Pharmacist Clinical Goal(s): o Over the next 180 days, patient will work with PharmD and  providers to maintain A1c goal <7%  Current regimen:  o Metformin 500 mg 1 tablet twice daily with a meal  Interventions o Discussed carbohydrate counting and exercising as tolerated extensively  Patient self care activities - Over the next 180 days, patient will: o Check blood sugar in the morning before eating or drinking, document, and provide at future appointments o Contact provider with any episodes of hypoglycemia  Medication management  Pharmacist Clinical Goal(s): o Over the next 180 days, patient will work with PharmD and providers to maintain optimal medication adherence  Current pharmacy: [facility]  Interventions o Comprehensive medication review performed. o Continue current medication management strategy  Patient self care activities - Over the next 180 days, patient will: o Focus on medication adherence by using weekly pill organizers o Take medications as prescribed o Report any questions or concerns to PharmD and/or provider(s)  Initial goal documentation        Ms. Kitchens was given information about Chronic Care Management services today including:  1. CCM service includes personalized support from designated clinical staff supervised by her physician, including individualized plan of care and coordination with other care providers 2. 24/7 contact phone numbers for assistance for urgent and routine care needs. 3. Standard insurance, coinsurance, copays and deductibles apply for chronic care management only during months in which we provide at least 20 minutes of these services. Most insurances cover these services at 100%, however patients may be responsible  for any copay, coinsurance and/or deductible if applicable. This service may help you avoid the need for more expensive face-to-face services. 4. Only one practitioner may furnish and bill the service in a calendar month. 5. The patient may stop CCM services at any time (effective at the end of the  month) by phone call to the office staff.  Patient agreed to services and verbal consent obtained.   The patient verbalized understanding of instructions provided today and agreed to receive a mailed copy of patient instruction and/or educational materials. Telephone follow up appointment with pharmacy team member scheduled for: 11/04/2020 at 4 PM . Geraldine Contras, PharmD Clinical Pharmacist [facility] [phone]   Cholesterol Content in Foods Cholesterol is a waxy, fat-like substance that helps to carry fat in the blood. The body needs cholesterol in small amounts, but too much cholesterol can cause damage to the arteries and heart. Most people should eat less than 200 milligrams (mg) of cholesterol a day. Foods with cholesterol  Cholesterol is found in animal-based foods, such as meat, seafood, and dairy. Generally, low-fat dairy and lean meats have less cholesterol than full-fat dairy and fatty meats. The milligrams of cholesterol per serving (mg per serving) of common cholesterol-containing foods are listed below. Meat and other proteins  Egg -- one large whole egg has 186 mg.  Veal shank -- 4 oz has 141 mg.  Lean ground [place] (93% lean) -- 4 oz has 118 mg.  Fat-trimmed lamb loin -- 4 oz has 106 mg.  Lean ground beef (90% lean) -- 4 oz has 100 mg.  Lobster -- 3.5 oz has 90 mg.  Pork loin chops -- 4 oz has 86 mg.  Canned salmon -- 3.5 oz has 83 mg.  Fat-trimmed beef top loin -- 4 oz has 78 mg.  Frankfurter -- 1 frank (3.5 oz) has 77 mg.  Crab -- 3.5 oz has 71 mg.  Roasted chicken without skin, white meat -- 4 oz has 66 mg.  Light bologna -- 2 oz has 45 mg.  Deli-cut [place] -- 2 oz has 31 mg.  Canned tuna -- 3.5 oz has 31 mg.  Berniece Salines -- 1 oz has 29 mg.  Oysters and mussels (raw) -- 3.5 oz has 25 mg.  Mackerel -- 1 oz has 22 mg.  Trout -- 1 oz has 20 mg.  Pork sausage -- 1 link (1 oz) has 17 mg.  Salmon -- 1 oz has 16  mg.  Tilapia -- 1 oz has 14 mg. Dairy  Soft-serve ice cream --  cup (4 oz) has 103 mg.  Whole-milk yogurt -- 1 cup (8 oz) has 29 mg.  Cheddar cheese -- 1 oz has 28 mg.  American cheese -- 1 oz has 28 mg.  Whole milk -- 1 cup (8 oz) has 23 mg.  2% milk -- 1 cup (8 oz) has 18 mg.  Cream cheese -- 1 tablespoon (Tbsp) has 15 mg.  Cottage cheese --  cup (4 oz) has 14 mg.  Low-fat (1%) milk -- 1 cup (8 oz) has 10 mg.  Sour cream -- 1 Tbsp has 8.5 mg.  Low-fat yogurt -- 1 cup (8 oz) has 8 mg.  Nonfat Greek yogurt -- 1 cup (8 oz) has 7 mg.  Half-and-half cream -- 1 Tbsp has 5 mg. Fats and oils  Cod liver oil -- 1 tablespoon (Tbsp) has 82 mg.  Butter -- 1 Tbsp has 15 mg.  Lard -- 1 Tbsp has 14  mg.  Berniece Salines grease -- 1 Tbsp has 14 mg.  Mayonnaise -- 1 Tbsp has 5-10 mg.  Margarine -- 1 Tbsp has 3-10 mg. Exact amounts of cholesterol in these foods may vary depending on specific ingredients and brands. Foods without cholesterol Most plant-based foods do not have cholesterol unless you combine them with a food that has cholesterol. Foods without cholesterol include:  Grains and cereals.  Vegetables.  Fruits.  Vegetable oils, such as olive, canola, and sunflower oil.  Legumes, such as peas, beans, and lentils.  Nuts and seeds.  Egg whites. Summary  The body needs cholesterol in small amounts, but too much cholesterol can cause damage to the arteries and heart.  Most people should eat less than 200 milligrams (mg) of cholesterol a day. This information is not intended to replace advice given to you by your health care provider. Make sure you discuss any questions you have with your health care provider. Document Revised: 09/23/2017 Document Reviewed: 06/07/2017 Elsevier Patient Education  [place].

## 2020-05-04 NOTE — Progress Notes (Signed)
  Subjective:  Patient ID: Cynthia Herrera, female    DOB: 1946-02-19,  MRN: [id number]  Chief Complaint  Patient presents with  . Neuroma    Pt states toes have improved with injections but states discomfort in plantar and dorsal midfoot is still present.   74 y.o. female returns for the above complaint.  History above confirmed with patient  Objective:  There were no vitals filed for this visit. General AA&O x3. Normal mood and affect.  Vascular Pedal pulses palpable.  Neurologic Epicritic sensation grossly intact.  Dermatologic Skin intact  Orthopedic: POP 3rd interspace bilat.   Assessment & Plan:  Patient was evaluated and treated and all questions answered.  Interdigital Neuroma, bilaterally -Sclerosing injections #3 as below -Discussed possible TTS symptoms  Procedure: Neurolysis Location: Bilateral 3rrd interspace Skin Prep: Alcohol. Injectate: 4% alcohol sclerosing injection. Disposition: Patient tolerated procedure well. Injection site dressed with a band-aid.    No follow-ups on file.

## 2020-05-13 DIAGNOSIS — L820 Inflamed seborrheic keratosis: Secondary | ICD-10-CM | POA: Diagnosis not present

## 2020-05-13 DIAGNOSIS — L821 Other seborrheic keratosis: Secondary | ICD-10-CM | POA: Diagnosis not present

## 2020-05-13 DIAGNOSIS — L720 Epidermal cyst: Secondary | ICD-10-CM | POA: Diagnosis not present

## 2020-05-15 ENCOUNTER — Other Ambulatory Visit: Payer: Self-pay | Admitting: Podiatry

## 2020-05-19 ENCOUNTER — Other Ambulatory Visit: Payer: Self-pay | Admitting: Internal Medicine

## 2020-05-19 DIAGNOSIS — I70209 Unspecified atherosclerosis of native arteries of extremities, unspecified extremity: Secondary | ICD-10-CM

## 2020-06-05 ENCOUNTER — Ambulatory Visit (INDEPENDENT_AMBULATORY_CARE_PROVIDER_SITE_OTHER): Payer: Medicare Other | Admitting: Internal Medicine

## 2020-06-05 ENCOUNTER — Other Ambulatory Visit: Payer: Self-pay

## 2020-06-05 ENCOUNTER — Encounter: Payer: Self-pay | Admitting: Internal Medicine

## 2020-06-05 VITALS — BP 120/70 | HR 62 | Temp 98.2°F | Ht 66.0 in | Wt 229.9 lb

## 2020-06-05 DIAGNOSIS — M62838 Other muscle spasm: Secondary | ICD-10-CM | POA: Diagnosis not present

## 2020-06-05 MED ORDER — CYCLOBENZAPRINE HCL 5 MG PO TABS: 5.0000 mg | ORAL_TABLET | Freq: Every evening | ORAL | 0 refills | Status: DC | PRN

## 2020-06-05 NOTE — Patient Instructions (Signed)
-  Nice seeing you today!!  -For your neck and shoulder pain: icing for 15 mins 2-3 a day, ibuprofen 2 tables 3 times a day for 5 days and then as needed, local massage therapy.

## 2020-06-05 NOTE — Progress Notes (Signed)
Acute office Visit     This visit occurred during the SARS-CoV-2 public health emergency.  Safety protocols were in place, including screening questions prior to the visit, additional usage of staff PPE, and extensive cleaning of exam room while observing appropriate contact time as indicated for disinfecting solutions.    CC/Reason for Visit: Acute neck and shoulder pain  HPI: Cynthia Herrera is a 74 y.o. female who is coming in today for the above mentioned reasons.  Last Saturday her fridge was not working so she had to pull it out to clean and fix it and push it back in.  Ever since then she has been having pain mostly over her left neck and upper shoulder but also over her right neck.  She has no tingling or numbness down her arms.  She tried some tramadol that she had at home but this just caused nausea vomiting so she stopped using it.   Past Medical/Surgical History: Past Medical History:  Diagnosis Date  . Arthritis   . Breast cancer, left breast ([place]) 2002   "then treated w/chemo and radiation"  . Breast cancer, left breast ([place]) 07/14/2017   "tx'd w/mastectomy"  . Chronic back pain    h/o lumbar stenosis; "no problem since my OR" (`10/27/2017)  . Chronic systolic CHF (congestive heart failure) ([place])   . Complication of anesthesia    last surgery iv med when going to sleep"burned as injected"  . COPD (chronic obstructive pulmonary disease) ([facility])    no inhalers   . DCM (dilated cardiomyopathy) ([place])    EF 15-20% ? tachycardia induced - EF 35-40% by echo 05/2018  . Dyspnea    with exertion  . Gallstones   . GERD (gastroesophageal reflux disease)    once in a while;depends on what she eats  . History of bronchitis    "chronic when I smoked; no problem since I quit in 2002" (10/27/2017)  . Hyperlipidemia   . Hypertension    takes Losartan and Metoprolol.   . Joint pain   . Muscle spasm    takes Flexeril daily as needed   . Nocturia   . OSA (obstructive sleep  apnea) 08/22/2018   Severe obstructive sleep apnea with an AHI 47.3/h and no significant central sleep apnea with moderate oxygen desaturations as low as 79%  . Osteopenia   . Persistent atrial fibrillation ([place])    s/p TEE DCCV and repeat DCCV 11/14/2013 and 02/2018  . Personal history of colonic polyps    adenomas 03 and 08  . Pneumonia    hx  . Seasonal allergies   . Thyroid nodule   . Type II diabetes mellitus ([place])   . Vitamin D deficiency    takes Vit D    Past Surgical History:  Procedure Laterality Date  . ATRIAL FIBRILLATION ABLATION N/A 12/12/2018   Procedure: ATRIAL FIBRILLATION ABLATION;  Surgeon: Thompson Grayer, MD;  Location: [place] [facility];  Service: Cardiovascular;  Laterality: N/A;  . AXILLARY LYMPH NODE DISSECTION Left 10/2001   persistent intramammary node Archie Endo 03/09/2011  . BACK SURGERY    . BREAST BIOPSY Left 2002  . BREAST BIOPSY Right 06/2017   "node bx was negative"  . BREAST BIOPSY Left 06/2017   "positive for cancer"  . BREAST LUMPECTOMY WITH RADIOACTIVE SEED LOCALIZATION Right 08/15/2017   Procedure: RIGHT BREAST LUMPECTOMY WITH RADIOACTIVE SEED LOCALIZATION;  Surgeon: Jovita Kussmaul, MD;  Location: [place];  Service: General;  Laterality: Right;  .  CARDIAC CATHETERIZATION  2015  . CARDIOVERSION N/A 11/12/2013   Procedure: CARDIOVERSION;  Surgeon: Thayer Headings, MD;  Location[facility] ENDOSCOPY;  Service: Cardiovascular;  Laterality: N/A;  10:08  Dr. Marissa Nestle, anesthesia present, [place]   60mg ,  propofol 50mg , IV for elective cardioversion....Dr. Cathie Olden delievered synch 120 joules with successful cardioversion to NSR  . CARDIOVERSION N/A 11/12/2013   Procedure: CARDIOVERSION;  Surgeon: Thayer Headings, MD;  Location: [place];  Service: Cardiovascular;  Laterality: N/A;  . CARDIOVERSION N/A 11/14/2013   Procedure: CARDIOVERSION (BEDSIDE);  Surgeon: Larey Dresser, MD;  Location: [place];  Service: Cardiovascular;  Laterality: N/A;  . CARDIOVERSION N/A  02/22/2018   Procedure: CARDIOVERSION;  Surgeon: Sueanne Margarita, MD;  Location: [facility] ENDOSCOPY;  Service: Cardiovascular;  Laterality: N/A;  . CATARACT EXTRACTION W/ INTRAOCULAR LENS  IMPLANT, BILATERAL Bilateral ~ 2016  . COLONOSCOPY  2003, September 2008, April 01, 2011   adenomas 03 and 08, polyp 12, diverticulosis  . COLONOSCOPY WITH PROPOFOL N/A 11/16/2016   Procedure: COLONOSCOPY WITH PROPOFOL;  Surgeon: Gatha Mayer, MD;  Location: [facility] ENDOSCOPY;  Service: Endoscopy;  Laterality: N/A;  . JOINT REPLACEMENT    . LAPAROSCOPIC CHOLECYSTECTOMY  2004  . LEFT AND RIGHT HEART CATHETERIZATION WITH CORONARY ANGIOGRAM N/A 02/01/2014   Procedure: LEFT AND RIGHT HEART CATHETERIZATION WITH CORONARY ANGIOGRAM;  Surgeon: Sueanne Margarita, MD;  Location: [place] CATH LAB;  Service: Cardiovascular;  Laterality: N/A;  . LUMBAR LAMINECTOMY/DECOMPRESSION MICRODISCECTOMY N/A 03/19/2014   Procedure: LUMBAR LAMINECTOMY/DECOMPRESSION MICRODISCECTOMY 4 LEVEL;  Surgeon: Kristeen Miss, MD;  Location: [place] NEURO ORS;  Service: Neurosurgery;  Laterality: N/A;  L1-2 L2-3 L3-4 L4-5 Laminectomy/Foraminotomy  . MASS EXCISION Left 08/15/2017   Procedure: EXCISION OF LEFT BREAST MASS;  Surgeon: Jovita Kussmaul, MD;  Location: [place];  Service: General;  Laterality: Left;  Marland Kitchen MASTECTOMY Left 10/27/2017  . MASTECTOMY PARTIAL / LUMPECTOMY W/ AXILLARY LYMPHADENECTOMY  05/08/2001   Archie Endo 03/09/2011  . PORT-A-CATH REMOVAL  2003  . PORTA CATH INSERTION  2002  . TEE WITHOUT CARDIOVERSION N/A 11/12/2013   Procedure: TRANSESOPHAGEAL ECHOCARDIOGRAM (TEE);  Surgeon: Thayer Headings, MD;  Location: [place];  Service: Cardiovascular;  Laterality: N/A;  pt b/p low, pt buccal membranes very dry, lips scapped, pt c/o thirst. NPO since MN and iv FLUIDS TOTAL INFUSING AT TOTAL 20ML HR....  Dr. Cathie Olden order allow NS to bolus during procedure....very dry,NS bolus 250 ml total..pt responding well to meds..  . TOTAL KNEE ARTHROPLASTY Left 2006  . TOTAL KNEE  ARTHROPLASTY Right 05/10/2016   Procedure: RIGHT TOTAL KNEE ARTHROPLASTY;  Surgeon: Gaynelle Arabian, MD;  Location: [facility] ORS;  Service: Orthopedics;  Laterality: Right;  With adductor block  . TOTAL MASTECTOMY Left 10/27/2017   Procedure: LEFT MASTECTOMY;  Surgeon: Jovita Kussmaul, MD;  Location: [place];  Service: General;  Laterality: Left;  . TUBAL LIGATION      Social History:  reports that she quit smoking about 18 years ago. Her smoking use included cigarettes. She has a 52.50 pack-year smoking history. She has never used smokeless tobacco. She reports that she does not drink alcohol and does not use drugs.  Allergies: Allergies  Allergen Reactions  . Tikosyn [Dofetilide]     Prolonged QT/QTc  . Zolpidem Other (See Comments)    Hallucinations, up walking around ? Hallucinations, up walking around  . Codeine Other (See Comments)  . Codeine Phosphate Nausea And Vomiting  . Methylprednisolone Other (See Comments)    Felt really weird  w the high dose oral steroid, but tolerates low doses or oral steroid Felt really weird w the high dose oral steroid, but tolerates low doses or oral steroid    Family History:  Family History  Problem Relation Age of Onset  . Dementia Sister   . Diabetes Sister   . Alzheimer's disease Sister   . Diabetes Brother        x 2  . Heart disease Brother   . Irritable bowel syndrome Brother   . Liver cancer Mother   . Diabetes Mother   . Pancreatic cancer Mother   . Diabetes Father   . Heart disease Father      Current Outpatient Medications:  .  amoxicillin (AMOXIL) 500 MG capsule, Take 2,000 mg by mouth See admin instructions. Take 2000 mg by mouth 1 hour prior to dental appointment, Disp: , Rfl: 5 .  atorvastatin (LIPITOR) 40 MG tablet, TAKE 1 TABLET BY MOUTH EVERYDAY AT BEDTIME, Disp: 90 tablet, Rfl: 3 .  BYSTOLIC 2.5 MG tablet, TAKE 1 TABLET BY MOUTH EVERY DAY, Disp: 90 tablet, Rfl: 1 .  Cholecalciferol (VITAMIN D3) 2000 units capsule, Take 2,000  Units by mouth daily., Disp: , Rfl:  .  clotrimazole-betamethasone (LOTRISONE) cream, Apply 1 application topically 2 (two) times daily., Disp: 30 g, Rfl: 0 .  fexofenadine (ALLEGRA) 180 MG tablet, Take 180 mg by mouth daily as needed for allergies or rhinitis., Disp: , Rfl:  .  fluconazole (DIFLUCAN) 150 MG tablet, Take 1 tablet (150 mg total) by mouth once a week., Disp: 6 tablet, Rfl: 2 .  fluticasone (FLONASE) 50 MCG/ACT nasal spray, Place 1 spray into both nostrils daily as needed for allergies. , Disp: , Rfl:  .  furosemide (LASIX) 20 MG tablet, TAKE 1 TABLET BY MOUTH TWICE A DAY, Disp: 180 tablet, Rfl: 2 .  gabapentin (NEURONTIN) 300 MG capsule, TAKE 1 CAPSULE BY MOUTH EVERYDAY AT BEDTIME, Disp: 30 capsule, Rfl: 0 .  glucose blood test strip, Use to check CBG AC and QHS., Disp: 200 each, Rfl: 12 .  KLOR-CON M20 20 MEQ tablet, TAKE 1 TABLET BY MOUTH EVERY DAY, Disp: 90 tablet, Rfl: 2 .  letrozole (FEMARA) 2.5 MG tablet, TAKE 1 TABLET BY MOUTH EVERY DAY, Disp: 90 tablet, Rfl: 3 .  losartan (COZAAR) 25 MG tablet, TAKE 1 TABLET BY MOUTH TWICE A DAY, Disp: 180 tablet, Rfl: 3 .  metFORMIN (GLUCOPHAGE) 500 MG tablet, TAKE 1 TABLET BY MOUTH TWICE A DAY WITH MEALS, Disp: 180 tablet, Rfl: 0 .  pantoprazole (PROTONIX) 40 MG tablet, TAKE 1 TABLET BY MOUTH EVERY DAY, Disp: 90 tablet, Rfl: 3 .  spironolactone (ALDACTONE) 25 MG tablet, TAKE 1 TABLET BY MOUTH EVERY DAY, Disp: 90 tablet, Rfl: 2 .  traMADol (ULTRAM) 50 MG tablet, Take 50 mg by mouth every 6 (six) hours as needed for moderate pain., Disp: , Rfl:  .  XARELTO 20 MG TABS tablet, TAKE 1 TABLET BY MOUTH EVERY DAY WITH SUPPER, Disp: 30 tablet, Rfl: 5 .  cyclobenzaprine (FLEXERIL) 5 MG tablet, Take 1 tablet (5 mg total) by mouth at bedtime as needed for muscle spasms., Disp: 20 tablet, Rfl: 0  Review of Systems:  Constitutional: Denies fever, chills, diaphoresis, appetite change and fatigue.  HEENT: Denies photophobia, eye pain, redness, hearing  loss, ear pain, congestion, sore throat, rhinorrhea, sneezing, mouth sores, trouble swallowing, neck pain, neck stiffness and tinnitus.   Respiratory: Denies SOB, DOE, cough, chest tightness,  and wheezing.  Cardiovascular: Denies chest pain, palpitations and leg swelling.  Gastrointestinal: Denies nausea, vomiting, abdominal pain, diarrhea, constipation, blood in stool and abdominal distention.  Genitourinary: Denies dysuria, urgency, frequency, hematuria, flank pain and difficulty urinating.  Endocrine: Denies: hot or cold intolerance, sweats, changes in hair or nails, polyuria, polydipsia. Musculoskeletal: Denies  back pain, joint swelling, arthralgias and gait problem.  Skin: Denies pallor, rash and wound.  Neurological: Denies dizziness, seizures, syncope, weakness, light-headedness, numbness and headaches.  Hematological: Denies adenopathy. Easy bruising, personal or family bleeding history  Psychiatric/Behavioral: Denies suicidal ideation, mood changes, confusion, nervousness, sleep disturbance and agitation    Physical Exam: Vitals:   06/05/20 1003  BP: 120/70  Pulse: 62  Temp: 98.2 F (36.8 C)  TempSrc: Oral  SpO2: 96%  Weight: 229 lb 14.4 oz (104.3 kg)  Height: 5\' 6"  (1.676 m)    Body mass index is 37.11 kg/m.   Constitutional: NAD, calm, comfortable Eyes: PERRL, lids and conjunctivae normal ENMT: Mucous membranes are moist. Musculoskeletal: Inability to turn head to left side, significant muscle spasm of the left upper neck and back. Neurologic: Grossly intact and nonfocal. Psychiatric: Normal judgment and insight. Alert and oriented x 3. Normal mood.    Impression and Plan:  Muscle spasms of neck  -Suspect acute torticollis. -Advised icing, scheduled ibuprofen over the next 5 days, local massage therapy and muscle relaxers.  She will return if no improvement.   Patient Instructions  -Nice seeing you today!!  -For your neck and shoulder pain: icing for 15  mins 2-3 a day, ibuprofen 2 tables 3 times a day for 5 days and then as needed, local massage therapy.       Lelon Frohlich, MD [facility]

## 2020-06-12 ENCOUNTER — Other Ambulatory Visit: Payer: Self-pay | Admitting: Cardiology

## 2020-06-12 ENCOUNTER — Other Ambulatory Visit: Payer: Self-pay | Admitting: Podiatry

## 2020-06-13 ENCOUNTER — Ambulatory Visit: Payer: Medicare Other | Admitting: Internal Medicine

## 2020-06-29 ENCOUNTER — Other Ambulatory Visit: Payer: Self-pay | Admitting: Internal Medicine

## 2020-06-29 DIAGNOSIS — M62838 Other muscle spasm: Secondary | ICD-10-CM

## 2020-07-02 NOTE — Telephone Encounter (Signed)
Last prescribed for acute muscle spasms. Should the patient continue this medication?

## 2020-07-09 ENCOUNTER — Other Ambulatory Visit: Payer: Self-pay | Admitting: Cardiology

## 2020-07-10 ENCOUNTER — Encounter: Payer: Self-pay | Admitting: Cardiology

## 2020-07-10 ENCOUNTER — Ambulatory Visit: Payer: Medicare Other | Admitting: Cardiology

## 2020-07-10 ENCOUNTER — Other Ambulatory Visit: Payer: Self-pay

## 2020-07-10 VITALS — BP 128/64 | HR 66 | Ht 66.0 in | Wt 232.8 lb

## 2020-07-10 DIAGNOSIS — I420 Dilated cardiomyopathy: Secondary | ICD-10-CM | POA: Diagnosis not present

## 2020-07-10 DIAGNOSIS — I4819 Other persistent atrial fibrillation: Secondary | ICD-10-CM | POA: Diagnosis not present

## 2020-07-10 DIAGNOSIS — I10 Essential (primary) hypertension: Secondary | ICD-10-CM | POA: Diagnosis not present

## 2020-07-10 DIAGNOSIS — I5042 Chronic combined systolic (congestive) and diastolic (congestive) heart failure: Secondary | ICD-10-CM

## 2020-07-10 NOTE — Addendum Note (Signed)
Addended by: Antonieta Iba on: 07/10/2020 04:48 PM   Modules accepted: Orders

## 2020-07-10 NOTE — Progress Notes (Signed)
Cardiology Office Note:    Date:  07/10/2020   ID:  Cynthia Herrera, DOB 11-15-1945, MRN [id number]  PCP:  Isaac Bliss, Rayford Halsted, MD  Cardiologist:  [name], MD    Referring MD: Isaac Bliss, [name]   Chief Complaint  Patient presents with  . Congestive Heart Failure  . Cardiomyopathy  . Atrial Fibrillation  . Hypertension  . Sleep Apnea    History of Present Illness:    Cynthia Herrera is a 74 y.o. 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 She was again cardioverted on 1/21/18after more loading with amio but failed to convert. Later that day she spontaneously converted to NSR. She stopped amio due to severe dizziness.   I saw her back in April 2018 she was back in atrial fibrillation with RVR.  She underwent repeat cardioversion to sinus rhythm on 02/22/2018.  Myoview 03/2018 showed EF improved  with no ischemia.  She is now followed in afib clinic.   Repeat 2D echocardiogram 06/21/2018 showed improvement in LV function with EF improved from 25% up to 35 to 40%. She was seen by Dr. Haroldine Laws  for chronic systolic heart failure with nonischemic dilated tachycardia mediated A. fib.  She is followed in AHF clinic by Dr. Haroldine Laws  She underwent sleep study showing severe obstructive sleep apnea with an AHI 47.3/h and no significant central sleep apnea with moderate oxygen desaturations as low as 79%.  She ultimately ended up seeing Dr. Ron Parker for an oral device for her OSA but was supposed to get a CPAP titration done.    She is here today for followup and is doing well.  She tells me that recently she has had problems with DOE. She says that her O2 sats have been in the upper 80's at home.  She has not seen Dr. Jamey Reas  with Pulmonary recently.  She denies any chest pain or pressure, PND, orthopnea, LE edema, palpitations or syncope. She also has been having swimmy headedness when she feels off balance and bumps into the door.  She is compliant with her meds and is tolerating meds with no SE.    Past Medical History:  Diagnosis Date  . Arthritis   . Breast cancer, left breast ([place]) 2002   "then treated w/chemo and radiation"  . Breast cancer, left breast ([place]) 07/14/2017   "tx'd w/mastectomy"  . Chronic back pain    h/o lumbar stenosis; "no problem since my OR" (`10/27/2017)  . Chronic systolic CHF (congestive heart failure) ([place])   . Complication of anesthesia    last surgery iv med when going to sleep"burned as injected"  . COPD (chronic obstructive pulmonary disease) ([facility])    no inhalers   . DCM (dilated cardiomyopathy) ([place])    EF 15-20% ? tachycardia induced - EF 35-40% by echo 05/2018  . Dyspnea    with exertion  . Gallstones   . GERD (gastroesophageal reflux disease)    once in a while;depends on what she eats  . History of bronchitis    "chronic when I smoked; no problem since I quit in 2002" (10/27/2017)  . Hyperlipidemia   . Hypertension    takes Losartan and Metoprolol.   . Joint pain   . Muscle spasm    takes  Flexeril daily as needed   . Nocturia   . OSA (obstructive sleep apnea) 08/22/2018   Severe obstructive sleep apnea with an AHI 47.3/h and no significant central sleep apnea with moderate oxygen desaturations as low as 79%  . Osteopenia   . Persistent atrial fibrillation ([place])    s/p TEE DCCV and repeat DCCV 11/14/2013 and 02/2018  . Personal history of colonic polyps    adenomas 03 and 08  . Pneumonia    hx  . Seasonal allergies   . Thyroid nodule   . Type II diabetes mellitus ([place])   . Vitamin D deficiency    takes Vit D    Past Surgical History:  Procedure Laterality Date  . ATRIAL FIBRILLATION ABLATION N/A 12/12/2018   Procedure: ATRIAL FIBRILLATION ABLATION;  Surgeon:  Thompson Grayer, MD;  Location: [facility];  Service: Cardiovascular;  Laterality: N/A;  . AXILLARY LYMPH NODE DISSECTION Left 10/2001   persistent intramammary node Archie Endo 03/09/2011  . BACK SURGERY    . BREAST BIOPSY Left 2002  . BREAST BIOPSY Right 06/2017   "node bx was negative"  . BREAST BIOPSY Left 06/2017   "positive for cancer"  . BREAST LUMPECTOMY WITH RADIOACTIVE SEED LOCALIZATION Right 08/15/2017   Procedure: RIGHT BREAST LUMPECTOMY WITH RADIOACTIVE SEED LOCALIZATION;  Surgeon: Jovita Kussmaul, MD;  Location: [facility];  Service: General;  Laterality: Right;  . CARDIAC CATHETERIZATION  2015  . CARDIOVERSION N/A 11/12/2013   Procedure: CARDIOVERSION;  Surgeon: Thayer Headings, MD;  Location: [facility] ENDOSCOPY;  Service: Cardiovascular;  Laterality: N/A;  10:08  Dr. Marissa Nestle, anesthesia present, [place]   60mg ,  propofol 50mg , IV for elective cardioversion....Dr. Cathie Olden delievered synch 120 joules with successful cardioversion to NSR  . CARDIOVERSION N/A 11/12/2013   Procedure: CARDIOVERSION;  Surgeon: Thayer Headings, MD;  Location: [place];  Service: Cardiovascular;  Laterality: N/A;  . CARDIOVERSION N/A 11/14/2013   Procedure: CARDIOVERSION (BEDSIDE);  Surgeon: Larey Dresser, MD;  Location: [facility];  Service: Cardiovascular;  Laterality: N/A;  . CARDIOVERSION N/A 02/22/2018   Procedure: CARDIOVERSION;  Surgeon: Sueanne Margarita, MD;  Location: [facility] ENDOSCOPY;  Service: Cardiovascular;  Laterality: N/A;  . CATARACT EXTRACTION W/ INTRAOCULAR LENS  IMPLANT, BILATERAL Bilateral ~ 2016  . COLONOSCOPY  2003, September 2008, April 01, 2011   adenomas 03 and 08, polyp 12, diverticulosis  . COLONOSCOPY WITH PROPOFOL N/A 11/16/2016   Procedure: COLONOSCOPY WITH PROPOFOL;  Surgeon: Gatha Mayer, MD;  Location: [facility] ENDOSCOPY;  Service: Endoscopy;  Laterality: N/A;  . JOINT REPLACEMENT    . LAPAROSCOPIC CHOLECYSTECTOMY  2004  . LEFT AND RIGHT HEART CATHETERIZATION WITH CORONARY ANGIOGRAM N/A 02/01/2014    Procedure: LEFT AND RIGHT HEART CATHETERIZATION WITH CORONARY ANGIOGRAM;  Surgeon: Sueanne Margarita, MD;  Location: [facility] C[facility] LAB;  Service: Cardiovascular;  Laterality: N/A;  . LUMBAR LAMINECTOMY/DECOMPRESSION MICRODISCECTOMY N/A 03/19/2014   Procedure: LUMBAR LAMINECTOMY/DECOMPRESSION MICRODISCECTOMY 4 LEVEL;  Surgeon: Kristeen Miss, MD;  Location: [facility] [facility]URO [facility]S;  Service: Neurosurgery;  Laterality: N/A;  L1-2 L2-3 L3-4 L4-5 Laminectomy/Foraminotomy  . MASS EXCISION Left 08/15/2017   Procedure: EXCISION OF LEFT BREAST MASS;  Surgeon: Jovita Kussmaul, MD;  Location: [facility];  Service: General;  Laterality: Left;  Marland Kitchen MASTECTOMY Left 10/27/2017  . MASTECTOMY PARTIAL / LUMPECTOMY W/ AXILLARY LYMPHADENECTOMY  05/08/2001   Archie Endo 03/09/2011  . PORT-A-CATH REMOVAL  2003  . PORTA CATH INSERTION  2002  . TEE WITHOUT CARDIOVERSION N/A 11/12/2013   Procedure: TRANSESOPHAGEAL ECHOCARDIOGRAM (  TEE);  Surgeon: Thayer Headings, MD;  Location: [facility];  Service: Cardiovascular;  Laterality: N/A;  pt b/p low, pt buccal membranes very dry, lips scapped, pt c/o thirst. NPO since MN and iv FLUIDS TOTAL INFUSING AT TOTAL 20ML HR....  Dr. Cathie Olden order allow NS to bolus during procedure....very dry,NS bolus 250 ml total..pt responding well to meds..  . TOTAL KNEE ARTHROPLASTY Left 2006  . TOTAL KNEE ARTHROPLASTY Right 05/10/2016   Procedure: RIGHT TOTAL KNEE ARTHROPLASTY;  Surgeon: Gaynelle Arabian, MD;  Location: [facility];  Service: Orthopedics;  Laterality: Right;  With adductor block  . TOTAL MASTECTOMY Left 10/27/2017   Procedure: LEFT MASTECTOMY;  Surgeon: Jovita Kussmaul, MD;  Location: [facility];  Service: General;  Laterality: Left;  . TUBAL LIGATION      Current Medications: Current Meds  Medication Sig  . amoxicillin (AMOXIL) 500 MG capsule Take 2,000 mg by mouth See admin instructions. Take 2000 mg by mouth 1 hour prior to dental appointment  . atorvastatin (LIPITOR) 40 MG tablet TAKE 1 TABLET BY MOUTH EVERYDAY AT  BEDTIME  . BYSTOLIC 2.5 MG tablet TAKE 1 TABLET BY MOUTH EVERY DAY  . Cholecalciferol (VITAMIN D3) 2000 units capsule Take 2,000 Units by mouth daily.  . clotrimazole-betamethasone (LOTRISONE) cream Apply 1 application topically 2 (two) times daily.  . cyclobenzaprine (FLEXERIL) 5 MG tablet TAKE 1 TABLET BY MOUTH AT BEDTIME AS NEEDED FOR MUSCLE SPASMS  . fexofenadine (ALLEGRA) 180 MG tablet Take 180 mg by mouth daily as needed for allergies or rhinitis.  . fluconazole (DIFLUCAN) 150 MG tablet Take 1 tablet (150 mg total) by mouth once a week.  . fluticasone (FLONASE) 50 MCG/ACT nasal spray Place 1 spray into both nostrils daily as needed for allergies.   . furosemide (LASIX) 20 MG tablet TAKE 1 TABLET BY MOUTH TWICE A DAY  . gabapentin (NEURONTIN) 300 MG capsule TAKE 1 CAPSULE BY MOUTH EVERYDAY AT BEDTIME  . glucose blood test strip Use to check CBG AC and QHS.  Marland Kitchen KLOR-CON M20 20 MEQ tablet TAKE 1 TABLET BY MOUTH EVERY DAY  . letrozole (FEMARA) 2.5 MG tablet TAKE 1 TABLET BY MOUTH EVERY DAY  . losartan (COZAAR) 25 MG tablet TAKE 1 TABLET BY MOUTH TWICE A DAY  . metFORMIN (GLUCOPHAGE) 500 MG tablet TAKE 1 TABLET BY MOUTH TWICE A DAY WITH MEALS  . pantoprazole (PROTONIX) 40 MG tablet TAKE 1 TABLET BY MOUTH EVERY DAY  . spironolactone (ALDACTONE) 25 MG tablet TAKE 1 TABLET BY MOUTH EVERY DAY  . traMADol (ULTRAM) 50 MG tablet Take 50 mg by mouth every 6 (six) hours as needed for moderate pain.  Marland Kitchen XARELTO 20 MG TABS tablet TAKE 1 TABLET BY MOUTH EVERY DAY WITH SUPPER     Allergies:   Tikosyn [dofetilide], Zolpidem, Codeine, Codeine phosphate, and Methylprednisolone   Social History   Socioeconomic History  . Marital status: Widowed    Spouse name: Not on file  . Number of children: 2  . Years of education: Not on file  . Highest education level: Not on file  Occupational History  . Occupation: retired  Tobacco Use  . Smoking status: Former Smoker    Packs/day: 1.50    Years: 35.00     Pack years: 52.50    Types: Cigarettes    Quit date: 08/24/2001    Years since quitting: 18.8  . Smokeless tobacco: Never Used  Vaping Use  . Vaping Use: Never used  Substance and Sexual Activity  .  Alcohol use: No  . Drug use: No  . Sexual activity: Never    Birth control/protection: Post-menopausal  Other Topics Concern  . Not on file  Social History Narrative  . Not on file   Social Determinants of Health   Financial Resource Strain: Low Risk   . Difficulty of Paying Living Expenses: Not very hard  Food Insecurity:   . Worried About Charity fundraiser in the Last Year: Not on file  . Ran Out of Food in the Last Year: Not on file  Transportation Needs:   . Lack of Transportation (Medical): Not on file  . Lack of Transportation (Non-Medical): Not on file  Physical Activity: Inactive  . Days of Exercise per Week: 0 days  . Minutes of Exercise per Session: 0 min  Stress:   . Feeling of Stress : Not on file  Social Connections:   . Frequency of Communication with Friends and Family: Not on file  . Frequency of Social Gatherings with Friends and Family: Not on file  . Attends Religious Services: Not on file  . Active Member of Clubs or Organizations: Not on file  . Attends Archivist Meetings: Not on file  . Marital Status: Not on file     Family History: The patient's family history includes Alzheimer's disease in her sister; Dementia in her sister; Diabetes in her brother, father, mother, and sister; Heart disease in her brother and father; Irritable bowel syndrome in her brother; Liver cancer in her mother; Pancreatic cancer in her mother.  ROS:   Please see the history of present illness.    ROS  All other systems reviewed and negative.   EKGs/Labs/Other Studies Reviewed:    The following studies were reviewed today: Office notes from Advanced heart failure and afib clinic  EKG:  EKG is  ordered today.  The ekg ordered today demonstrates NSR with PVCs  at 74bpm with nonspecific ST abnormality  Recent Labs: 01/01/2020: ALT 13; BUN 21; Creatinine, Ser 1.14; Hemoglobin 12.0; Platelets 306; Potassium 4.4; Sodium 140   Recent Lipid Panel    Component Value Date/Time   CHOL 140 01/01/2020 1143   TRIG 159 (H) 01/01/2020 1143   HDL 43 01/01/2020 1143   CHOLHDL 3.3 01/01/2020 1143   CHOLHDL 3 02/03/2017 1207   VLDL 23.0 02/03/2017 1207   LDLCALC 70 01/01/2020 1143    Physical Exam:    VS:  BP 128/64   Pulse 66   Ht 5\' 6"  (1.676 m)   Wt 232 lb 12.8 oz (105.6 kg)   SpO2 (!) 88%   BMI 37.57 kg/m     Wt Readings from Last 3 Encounters:  07/10/20 232 lb 12.8 oz (105.6 kg)  06/05/20 229 lb 14.4 oz (104.3 kg)  03/17/20 232 lb (105.2 kg)     GEN: Well nourished, well developed in no acute distress HEENT: Normal NECK: No JVD; No carotid bruits LYMPHATICS: No lymphadenopathy CARDIAC:RRR, no murmurs, rubs, gallops RESPIRATORY:  Clear to auscultation without rales, wheezing or rhonchi  ABDOMEN: Soft, non-tender, non-distended MUSCULOSKELETAL:  No edema; No deformity  SKIN: Warm and dry NEUROLOGIC:  Alert and oriented x 3 PSYCHIATRIC:  Normal affect    ASSESSMENT:    1. Chronic combined systolic and diastolic CHF (congestive heart failure) ([place])   2. DCM (dilated cardiomyopathy) ([place])   3. Persistent atrial fibrillation ([place])   4. Essential hypertension    PLAN:    In order of problems listed above:  1.  Chronic combined systolic/diastolic CHF  -she appears euvolemic on exam today -weight has been stable and she denies any LE edema  -she is having more DOE and her O2 sats have been in the upper 80's on RA at home and today -I will check a CMET and BNP today -repeat 2D Echo to reassess LVF -suspect her hypoxemia is due to her COPD -continue Bystolic 2.5mg  daily, Losartan 25mg  daily, spiro 25mg  daily -check BMET today -I am going to get her in with Pulmonary ASAP  2.  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   -Cath 2015  with normal cors.   -Treated with chemo/XRT in 2002 for breast CA but does not think she got Adriamycin.   -EF 45% at time of cath in 2015 but dropped to 25% in 02/2018 at time of afib with RVR reoccurrence.  -Echo 05/2018 showed improvement in EF from 25% now up to 35-40%.   -last echo 09/2019 showed EF 45-50% with G1DD and mild LAEwith  -continue BB, diuretic and ARB  3.   Persistent atrial fibrillation  -maintaining NSR  - off amio due to severe dizziness -Failed Tikosyn 03/2018 due to QT prolongation.  -denies any palpitations -no bleeding on DOAC -continue Xarelto 20mg  daily -check BMET and Hbg to recalculate CrCl  4.  HTN -BP controlled on exam  -continue Bystolic 2.5mg  daily, Losartan 25mg  daily and spiro   5.  OSA  - PSG showed  severe obstructive sleep apnea with an AHI 47.3/h and no significant central sleep apnea with moderate oxygen desaturations as low as 79%.  -she had a CPAP titration but did not tolerate the mask and is now using an oral device and seeing Dr. Ron Parker  6.  DM type 2 -followed by PCP -Hb1C was 6.7% in April 2021 -continue Metformin and ARB  7.  ASCAD  -coronary artery calcifications on CT scan and on cath with no obstructive lesions -denies any chest pain -continue Lipitor 40mg  daily  Medication Adjustments/Labs and Tests Ordered: Current medicines are reviewed at length with the patient today.  Concerns regarding medicines are outlined above.  No orders of the defined types were placed in this encounter.  No orders of the defined types were placed in this encounter.   Signed, [name], MD  07/10/2020 4:33 PM    [place]

## 2020-07-10 NOTE — Patient Instructions (Signed)
Medication Instructions:  Your physician recommends that you continue on your current medications as directed. Please refer to the Current Medication list given to you today.  *If you need a refill on your cardiac medications before your next appointment, please call your pharmacy*   Lab Work: TODAY: BMET, CBC, BNP If you have labs (blood work) drawn today and your tests are completely normal, you will receive your results only by: Marland Kitchen MyChart Message (if you have MyChart) OR . A paper copy in the mail If you have any lab test that is abnormal or we need to change your treatment, we will call you to review the results.   Testing/Procedures: Your physician has requested that you have an echocardiogram. Echocardiography is a painless test that uses sound waves to create images of your heart. It provides your doctor with information about the size and shape of your heart and how well your heart's chambers and valves are working. This procedure takes approximately one hour. There are no restrictions for this procedure.  Follow-Up: At [facility], you and your health needs are our priority.  As part of our continuing mission to provide you with exceptional heart care, we have created designated Provider Care Teams.  These Care Teams include your primary Cardiologist (physician) and Advanced Practice Providers (APPs -  Physician Assistants and Nurse Practitioners) who all work together to provide you with the care you need, when you need it.  Your next appointment:   6 month(s)  The format for your next appointment:   In Person  Provider:   You may see [name], MD or one of the following Advanced Practice Providers on your designated Care Team:    Melina Copa, PA-C  Ermalinda Barrios, PA-C

## 2020-07-11 LAB — BASIC METABOLIC PANEL
BUN/Creatinine Ratio: 20 (ref 12–28)
BUN: 25 mg/dL (ref 8–27)
CO2: 21 mmol/L (ref 20–29)
Calcium: 9.6 mg/dL (ref 8.7–10.3)
Chloride: 105 mmol/L (ref 96–106)
Creatinine, Ser: 1.25 mg/dL — ABNORMAL HIGH (ref 0.57–1.00)
GFR calc Af Amer: 49 mL/min/{1.73_m2} — ABNORMAL LOW (ref >59)
GFR calc non Af Amer: 42 mL/min/{1.73_m2} — ABNORMAL LOW (ref >59)
Glucose: 109 mg/dL — ABNORMAL HIGH (ref 65–99)
Potassium: 4.2 mmol/L (ref 3.5–5.2)
Sodium: 142 mmol/L (ref 134–144)

## 2020-07-11 LAB — CBC
Hematocrit: 36.8 % (ref 34.0–46.6)
Hemoglobin: 12 g/dL (ref 11.1–15.9)
MCH: 28.6 pg (ref 26.6–33.0)
MCHC: 32.6 g/dL (ref 31.5–35.7)
MCV: 88 fL (ref 79–97)
Platelets: 321 10*3/uL (ref 150–450)
RBC: 4.2 x10E6/uL (ref 3.77–5.28)
RDW: 13.9 % (ref 11.7–15.4)
WBC: 9.1 10*3/uL (ref 3.4–10.8)

## 2020-07-11 LAB — PRO B NATRIURETIC PEPTIDE: NT-Pro BNP: 150 pg/mL (ref 0–301)

## 2020-07-17 ENCOUNTER — Encounter: Payer: Self-pay | Admitting: Pulmonary Disease

## 2020-07-17 ENCOUNTER — Ambulatory Visit (INDEPENDENT_AMBULATORY_CARE_PROVIDER_SITE_OTHER): Payer: Medicare Other

## 2020-07-17 ENCOUNTER — Ambulatory Visit: Payer: Medicare Other | Admitting: Pulmonary Disease

## 2020-07-17 ENCOUNTER — Other Ambulatory Visit: Payer: Self-pay

## 2020-07-17 VITALS — BP 120/74 | HR 73 | Temp 97.8°F | Ht 66.0 in | Wt 234.2 lb

## 2020-07-17 DIAGNOSIS — I5042 Chronic combined systolic (congestive) and diastolic (congestive) heart failure: Secondary | ICD-10-CM | POA: Diagnosis not present

## 2020-07-17 DIAGNOSIS — G4733 Obstructive sleep apnea (adult) (pediatric): Secondary | ICD-10-CM

## 2020-07-17 DIAGNOSIS — J309 Allergic rhinitis, unspecified: Secondary | ICD-10-CM | POA: Insufficient documentation

## 2020-07-17 DIAGNOSIS — Z23 Encounter for immunization: Secondary | ICD-10-CM

## 2020-07-17 DIAGNOSIS — I4819 Other persistent atrial fibrillation: Secondary | ICD-10-CM

## 2020-07-17 DIAGNOSIS — R0602 Shortness of breath: Secondary | ICD-10-CM | POA: Diagnosis not present

## 2020-07-17 DIAGNOSIS — R0600 Dyspnea, unspecified: Secondary | ICD-10-CM | POA: Diagnosis not present

## 2020-07-17 DIAGNOSIS — Z0000 Encounter for general adult medical examination without abnormal findings: Secondary | ICD-10-CM | POA: Insufficient documentation

## 2020-07-17 NOTE — Assessment & Plan Note (Signed)
Plan: Keep follow-up with cardiology 

## 2020-07-17 NOTE — Progress Notes (Signed)
@Patient  ID: Cynthia Herrera, female    DOB: 28-Apr-1946, 74 y.o.   MRN: [id number]  Chief Complaint  Patient presents with  . 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 fib, history of left breast cancer Smoker/ Smoking History: Former smoker.  Quit 2002.  52.5-pack-year smoking history Maintenance: None Pt of: Dr. 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  She is presenting back to our office today after last being seen in June/2020 for worsening dyspnea.  07/10/20 - Cardiologist  1. Chronic combined systolic/diastolic CHF -she appears euvolemic on exam today -weight has been stable and she denies any LE edema  -she is having more DOE and her O2 sats have been in the upper 80's on RA at home and today -I will check a CMET and BNP today -repeat 2D Echo to reassess LVF -suspect her hypoxemia is due to her COPD -continue Bystolic 2.5mg  daily, Losartan 25mg  daily, spiro 25mg  daily -check BMET today -I am going to get her in with Pulmonary ASAP  2. 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  -Cath 2015 with normal cors.  -Treated with chemo/XRT in 2002 for breast CA but does not think she got Adriamycin.  -EF 45% at time of cath in 2015 but dropped to 25% in 02/2018 at time of afib with RVR reoccurrence.  -Echo 05/2018 showed improvement in EF from 25% now up to 35-40%.  -last echo 09/2019 showed EF 45-50% with G1DD and mild LAEwith  -continue BB, diuretic and ARB  3. Persistent atrial fibrillation -maintaining NSR  - off amio due to severe dizziness -Failed Tikosyn 03/2018 due to QT prolongation.   -denies any palpitations -no bleeding on DOAC -continue Xarelto 20mg  daily -check BMET and Hbg to recalculate CrCl  4. HTN -BP controlled on exam -continue Bystolic 2.5mg  daily, Losartan 25mg  daily and spiro  5.OSA - PSG showedsevere obstructive sleep apnea with an AHI 47.3/h and no significant central sleep apnea with moderate oxygen desaturations as low as 79%.  -she had a CPAP titration but did not tolerate the mask and is now using an oral device and seeing Dr. Ron Parker  6.  DM type 2 -followed by PCP -Hb1C was 6.7% in April 2021 -continue Metformin and ARB  7.  ASCAD  -coronary artery calcifications on CT scan and on cath with no obstructive lesions -denies any chest pain -continue Lipitor 40mg  daily  Patient does have severe obstructive sleep apnea.  She is currently using oral appliance from Dr. Ron Parker.  When she was retested while using her oral appliance her AHI came down to 30 -per patient.  I do not have these records.  Patient's recent blood work with cardiology showed a stable hemoglobin as well as a stable BNP.  Patient is concerned regarding her kidney functioning.  She reports that she will follow up with primary care and cardiology regarding use of her Lasix.  Patient does feel that she is having more congestion.  She feels that her cough is worse when laying flat at night.  She does not use a daily antihistamine regularly.  She does not use her Flonase regularly.  We will discuss  this today.  She is not interested in utilizing inhalers.  She has never been on an inhaler before.  She feels that her shortness of breath is about the same as when she was evaluated last in June/2020.  Pulmonary function testing at that time was stable.  Repeat high-resolution CT chest in December/2020 was also stable not showing fibrotic ILD.  Patient was walked in office today without any oxygen desaturations.  Patient was able to complete 1 lap talking while  completing.  Questionaires / Pulmonary Flowsheets:   ACT:  No flowsheet data found.  MMRC: mMRC Dyspnea Scale mMRC Score  07/17/2020 2    Epworth:  No flowsheet data found.  Tests:   10/03/2019-CT chest high-res-no evidence of fibrotic interstitial lung disease, no evidence of amiodarone toxicity, air trapping is indicative of small airways disease, aortic arthrosclerosis, enlarged pulmonary trunk indicative of pulmonary artery hypertension  09/25/2019-echocardiogram-LV ejection fraction 45 to 50%, no LVH, global right ventricle has normal systolic function  7/49/4496-PRFFMBWGY function test- FVC 2.81 (89% addicted), ratio 84, FEV1 2.35 (99% predicted), DLCO 17.52 (85% addicted) FENO:  Lab Results  Component Value Date   NITRICOXIDE 21 09/20/2018    PFT: PFT Results Latest Ref Rng & Units 04/18/2019 08/24/2018 12/05/2014  FVC-Pre L 2.81 2.77 2.76  FVC-Predicted Pre % 89 87 81  FVC-Post L - 2.71 2.72  FVC-Predicted Post % - 85 80  Pre FEV1/FVC % % 84 83 85  Post FEV1/FCV % % - 86 84  FEV1-Pre L 2.35 2.28 2.34  FEV1-Predicted Pre % 99 95 90  FEV1-Post L - 2.32 2.30  DLCO uncorrected ml/min/mmHg 17.52 15.65 18.54  DLCO UNC% % 85 58 65  DLVA Predicted % 110 73 82  TLC L - 4.56 -  TLC % Predicted % - 85 -  RV % Predicted % - 84 -    WALK:  SIX MIN WALK 07/17/2020 04/19/2019 11/02/2018  Supplimental Oxygen during Test? (L/min) No No No  Tech Comments: - dyspnea 2-3 on a scale of 1-5 at end Pt walked at a normal pace only completing two laps due to moderate SOB and back pain.    Imaging: No results found.  Lab Results:  CBC    Component Value Date/Time   WBC 9.1 07/10/2020 1653   WBC 9.1 01/02/2018 1451   RBC 4.20 07/10/2020 1653   RBC 4.19 01/02/2018 1451   HGB 12.0 07/10/2020 1653   HGB 12.8 12/10/2009 1401   HCT 36.8 07/10/2020 1653   HCT 37.3 12/10/2009 1401   PLT 321 07/10/2020 1653   MCV 88 07/10/2020 1653   MCV 89.0 12/10/2009 1401   MCH 28.6 07/10/2020  1653   MCH 29.8 01/02/2018 1451   MCHC 32.6 07/10/2020 1653   MCHC 33.1 01/02/2018 1451   RDW 13.9 07/10/2020 1653   RDW 12.6 12/10/2009 1401   LYMPHSABS 1.0 02/03/2017 1207   LYMPHSABS 1.4 12/10/2009 1401   MONOABS 0.8 02/03/2017 1207   MONOABS 0.5 12/10/2009 1401   EOSABS 0.3 02/03/2017 1207   EOSABS 0.2 12/10/2009 1401   BASOSABS 0.1 02/03/2017 1207   BASOSABS 0.0 12/10/2009 1401    BMET    Component Value Date/Time   NA 142 07/10/2020 1653   K 4.2 07/10/2020 1653   CL 105 07/10/2020 1653   CO2 21 07/10/2020 1653   GLUCOSE 109 (H) 07/10/2020 1653   GLUCOSE 100 (H) 08/23/2018 1510   BUN 25 07/10/2020 1653   CREATININE 1.25 (H) 07/10/2020 1653  CREATININE 1.06 (H) 08/19/2015 1244   CALCIUM 9.6 07/10/2020 1653   GFRNONAA 42 (L) 07/10/2020 1653   GFRAA 49 (L) 07/10/2020 1653    BNP No results found for: BNP  ProBNP    Component Value Date/Time   PROBNP 150 07/10/2020 1653   PROBNP 58.0 11/18/2014 1649    Specialty Problems      Pulmonary Problems   COPD (chronic obstructive pulmonary disease) (HCC)    Qualifier: Diagnosis of  By: Tiney Rouge CMA, Lakisha        OSA (obstructive sleep apnea)    Severe obstructive sleep apnea with an AHI 47.3/h and no significant central sleep apnea with moderate oxygen desaturations as low as 79%      Abnormal PFT   Allergic rhinitis   Shortness of breath      Allergies  Allergen Reactions  . Tikosyn [Dofetilide]     Prolonged QT/QTc  . Zolpidem Other (See Comments)    Hallucinations, up walking around ? Hallucinations, up walking around  . Codeine Other (See Comments)  . Codeine Phosphate Nausea And Vomiting  . Methylprednisolone Other (See Comments)    Felt really weird w the high dose oral steroid, but tolerates low doses or oral steroid Felt really weird w the high dose oral steroid, but tolerates low doses or oral steroid    Immunization History  Administered Date(s) Administered  . Fluad Quad(high Dose  65+) 07/18/2019  . Influenza Split 09/29/2011, 07/20/2012  . Influenza Whole 08/14/2008  . Influenza, High Dose Seasonal PF 09/09/2017, 07/17/2020  . Influenza,inj,Quad PF,6+ Mos 10/23/2013, 08/20/2014, 08/14/2015, 08/27/2016, 08/09/2018  . PFIZER SARS-COV-2 Vaccination 12/15/2019, 01/08/2020  . Pneumococcal Conjugate-13 10/23/2013  . Pneumococcal Polysaccharide-23 06/18/2011  . Tdap 06/18/2011  . Zoster 07/20/2012    Past Medical History:  Diagnosis Date  . Arthritis   . Breast cancer, left breast ([place]) 2002   "then treated w/chemo and radiation"  . Breast cancer, left breast ([place]) 07/14/2017   "tx'd w/mastectomy"  . Chronic back pain    h/o lumbar stenosis; "no problem since my OR" (`10/27/2017)  . Chronic systolic CHF (congestive heart failure) ([place])   . Complication of anesthesia    last surgery iv med when going to sleep"burned as injected"  . COPD (chronic obstructive pulmonary disease) (HCC)    no inhalers   . DCM (dilated cardiomyopathy) ([place])    EF 15-20% ? tachycardia induced - EF 35-40% by echo 05/2018  . Dyspnea    with exertion  . Gallstones   . GERD (gastroesophageal reflux disease)    once in a while;depends on what she eats  . History of bronchitis    "chronic when I smoked; no problem since I quit in 2002" (10/27/2017)  . Hyperlipidemia   . Hypertension    takes Losartan and Metoprolol.   . Joint pain   . Muscle spasm    takes Flexeril daily as needed   . Nocturia   . OSA (obstructive sleep apnea) 08/22/2018   Severe obstructive sleep apnea with an AHI 47.3/h and no significant central sleep apnea with moderate oxygen desaturations as low as 79%  . Osteopenia   . Persistent atrial fibrillation ([place])    s/p TEE DCCV and repeat DCCV 11/14/2013 and 02/2018  . Personal history of colonic polyps    adenomas 03 and 08  . Pneumonia    hx  . Seasonal allergies   . Thyroid nodule   . Type II diabetes mellitus ([place])   . Vitamin  D deficiency    takes Vit D     Tobacco History: Social History   Tobacco Use  Smoking Status Former Smoker  . Packs/day: 1.50  . Years: 35.00  . Pack years: 52.50  . Types: Cigarettes  . Quit date: 08/24/2001  . Years since quitting: 18.9  Smokeless Tobacco Never Used   Counseling given: Not Answered   Continue to not smoke  Outpatient Encounter Medications as of 07/17/2020  Medication Sig  . amoxicillin (AMOXIL) 500 MG capsule Take 2,000 mg by mouth See admin instructions. Take 2000 mg by mouth 1 hour prior to dental appointment  . atorvastatin (LIPITOR) 40 MG tablet TAKE 1 TABLET BY MOUTH EVERYDAY AT BEDTIME  . BYSTOLIC 2.5 MG tablet TAKE 1 TABLET BY MOUTH EVERY DAY  . Cholecalciferol (VITAMIN D3) 2000 units capsule Take 2,000 Units by mouth daily.  . clotrimazole-betamethasone (LOTRISONE) cream Apply 1 application topically 2 (two) times daily.  . cyclobenzaprine (FLEXERIL) 5 MG tablet TAKE 1 TABLET BY MOUTH AT BEDTIME AS NEEDED FOR MUSCLE SPASMS  . fexofenadine (ALLEGRA) 180 MG tablet Take 180 mg by mouth daily as needed for allergies or rhinitis.  . fluconazole (DIFLUCAN) 150 MG tablet Take 1 tablet (150 mg total) by mouth once a week.  . fluticasone (FLONASE) 50 MCG/ACT nasal spray Place 1 spray into both nostrils daily as needed for allergies.   . furosemide (LASIX) 20 MG tablet TAKE 1 TABLET BY MOUTH TWICE A DAY  . gabapentin (NEURONTIN) 300 MG capsule TAKE 1 CAPSULE BY MOUTH EVERYDAY AT BEDTIME  . glucose blood test strip Use to check CBG AC and QHS.  Marland Kitchen KLOR-CON M20 20 MEQ tablet TAKE 1 TABLET BY MOUTH EVERY DAY  . letrozole (FEMARA) 2.5 MG tablet TAKE 1 TABLET BY MOUTH EVERY DAY  . losartan (COZAAR) 25 MG tablet TAKE 1 TABLET BY MOUTH TWICE A DAY  . metFORMIN (GLUCOPHAGE) 500 MG tablet TAKE 1 TABLET BY MOUTH TWICE A DAY WITH MEALS  . pantoprazole (PROTONIX) 40 MG tablet TAKE 1 TABLET BY MOUTH EVERY DAY  . spironolactone (ALDACTONE) 25 MG tablet TAKE 1 TABLET BY MOUTH EVERY DAY  . traMADol  (ULTRAM) 50 MG tablet Take 50 mg by mouth every 6 (six) hours as needed for moderate pain.  Marland Kitchen XARELTO 20 MG TABS tablet TAKE 1 TABLET BY MOUTH EVERY DAY WITH SUPPER   No facility-administered encounter medications on file as of 07/17/2020.     Review of Systems  Review of Systems  Constitutional: Positive for fatigue. Negative for activity change and fever.  HENT: Positive for congestion and postnasal drip. Negative for sinus pressure, sinus pain and sore throat.   Respiratory: Positive for shortness of breath. Negative for cough and wheezing.   Cardiovascular: Negative for chest pain and palpitations.  Gastrointestinal: Negative for diarrhea, nausea and vomiting.  Musculoskeletal: Negative for arthralgias.  Neurological: Negative for dizziness.  Psychiatric/Behavioral: Negative for sleep disturbance. The patient is not nervous/anxious.      Physical Exam  BP 120/74 (BP Location: Left Arm, Cuff Size: Normal)   Pulse 73   Temp 97.8 F (36.6 C) (Oral)   Ht 5\' 6"  (1.676 m)   Wt 234 lb 3.2 oz (106.2 kg)   SpO2 95%   BMI 37.80 kg/m   Wt Readings from Last 5 Encounters:  07/17/20 234 lb 3.2 oz (106.2 kg)  07/10/20 232 lb 12.8 oz (105.6 kg)  06/05/20 229 lb 14.4 oz (104.3 kg)  03/17/20 232 lb (105.2 kg)  02/12/20 233 lb 14.4 oz (106.1 kg)    BMI Readings from Last 5 Encounters:  07/17/20 37.80 kg/m  07/10/20 37.57 kg/m  06/05/20 37.11 kg/m  03/17/20 37.45 kg/m  02/12/20 37.75 kg/m     Physical Exam Vitals and nursing note reviewed.  Constitutional:      General: She is not in acute distress.    Appearance: Normal appearance. She is obese.  HENT:     Head: Normocephalic and atraumatic.     Right Ear: Ear canal and external ear normal. There is no impacted cerumen. Tympanic membrane is bulging.     Left Ear: Ear canal and external ear normal. There is no impacted cerumen. Tympanic membrane is bulging.     Ears:     Comments: Bulging TM, bilaterally, without  infection    Nose: Rhinorrhea present. No congestion.     Mouth/Throat:     Mouth: Mucous membranes are moist.     Pharynx: Oropharynx is clear.     Comments: Postnasal drip Eyes:     Pupils: Pupils are equal, round, and reactive to light.  Cardiovascular:     Rate and Rhythm: Normal rate and regular rhythm.     Pulses: Normal pulses.     Heart sounds: Normal heart sounds. No murmur heard.   Pulmonary:     Effort: Pulmonary effort is normal. No respiratory distress.     Breath sounds: Normal breath sounds. No decreased air movement. No decreased breath sounds, wheezing or rales.  Musculoskeletal:     Cervical back: Normal range of motion.  Skin:    General: Skin is warm and dry.     Capillary Refill: Capillary refill takes less than 2 seconds.  Neurological:     General: No focal deficit present.     Mental Status: She is alert and oriented to person, place, and time. Mental status is at baseline.     Gait: Gait normal.  Psychiatric:        Mood and Affect: Mood normal.        Behavior: Behavior normal.        Thought Content: Thought content normal.        Judgment: Judgment normal.       Assessment & Plan:   Persistent atrial fibrillation (HCC) Plan: Keep follow-up with cardiology  Chronic combined systolic and diastolic CHF (congestive heart failure) ([place]) Recent evaluation by cardiology stable Recent blood work stable  Plan: Continue follow-up with cardiology Encourage patient to discuss any concerns regarding her Lasix prescription with primary care and cardiology before changing Obtain repeat echocardiogram as planned by cardiology  OSA (obstructive sleep apnea) Known severe obstructive sleep apnea Patient intolerant of CPAP therapy Currently using oral appliance which brings patient's AHI down to 30 from 47.3  Plan: Continue to use oral appliance  Allergic rhinitis Postnasal drip and rhinorrhea on exam today Eustachian tube dysfunction  bilaterally  Plan: Start taking daily antihistamine regularly Start Flonase 1 spray each nostril daily I have encouraged patient to consider starting nasal saline rinses prior to Flonase use  Shortness of breath Shortness of breath is likely multifactorial.  Patient reports that this is stable and has not worsened since June/2020.  Walk today in office stable without any oxygen desaturations  Plan: We will order chest x-ray today given patient's persisting symptoms and last imaging being in [place] maintenance Flu vaccine today    Return in about 5 weeks (around 08/21/2020), or if symptoms worsen or fail to  improve, for Follow up with Dr. Purnell Shoemaker.   Lauraine Rinne, NP 07/17/2020   This appointment required 42 minutes of patient care (this includes precharting, chart review, review of results, face-to-face care, etc.).

## 2020-07-17 NOTE — Assessment & Plan Note (Addendum)
Recent evaluation by cardiology stable Recent blood work stable  Plan: Continue follow-up with cardiology Encourage patient to discuss any concerns regarding her Lasix prescription with primary care and cardiology before changing Obtain repeat echocardiogram as planned by cardiology

## 2020-07-17 NOTE — Assessment & Plan Note (Signed)
Known severe obstructive sleep apnea Patient intolerant of CPAP therapy Currently using oral appliance which brings patient's AHI down to 30 from 47.3  Plan: Continue to use oral appliance

## 2020-07-17 NOTE — Assessment & Plan Note (Signed)
Shortness of breath is likely multifactorial.  Patient reports that this is stable and has not worsened since June/2020.  Walk today in office stable without any oxygen desaturations  Plan: We will order chest x-ray today given patient's persisting symptoms and last imaging being in December/2020

## 2020-07-17 NOTE — Patient Instructions (Addendum)
You were seen today by Lauraine Rinne, NP  for:   1. Shortness of breath  - DG Chest 2 View; Future  Walk today in office  2. Allergic rhinitis, unspecified seasonality, unspecified trigger  Please start taking a daily antihistamine:  >>>choose one of: zyrtec, claritin, allegra, or xyzal  >>>these are over the counter medications  >>>can choose generic option  >>>take daily  >>>this medication helps with allergies, post nasal drip, and cough   Continue Flonase/fluticasone nasal spray 1 spray each nostril daily for nasal congestion  Can start nasal saline rinses 1-2x daily Use distilled water Shake well Get bottle lukewarm like a baby bottle   3. Chronic combined systolic and diastolic CHF (congestive heart failure) (HCC) 4. Persistent atrial fibrillation ([place])  Keep follow-up with cardiology  Continue take Lasix as prescribed  If you have concerns about taking Lasix please discuss this with primary care and cardiology  5. OSA (obstructive sleep apnea)  Continue to use your oral appliance as managed by Dr. Ron Parker  6. Healthcare maintenance  High-dose flu vaccine today   We recommend today:  Orders Placed This Encounter  Procedures  . DG Chest 2 View    Standing Status:   Future    Number of Occurrences:   1    Standing Expiration Date:   11/16/2020    Order Specific Question:   Reason for Exam (SYMPTOM  OR DIAGNOSIS REQUIRED)    Answer:   doe    Order Specific Question:   Preferred imaging location?    Answer:   Internal    Order Specific Question:   Radiology Contrast Protocol - do NOT remove file path    Answer:   [email or web address]   Orders Placed This Encounter  Procedures  . DG Chest 2 View   No orders of the defined types were placed in this encounter.   Follow Up:    Return in about 5 weeks (around 08/21/2020), or if symptoms worsen or fail to improve, for Follow up with Dr. Purnell Shoemaker. SCHEDULE  WITH DR. Purnell Shoemaker   Notification of test results are managed in the following manner: If there are  any recommendations or changes to the  plan of care discussed in office today,  we will contact you and let you know what they are. If you do not hear from [place], then your results are normal and you can view them through your  MyChart account , or a letter will be sent to you. Thank you again for trusting [place] with your care  - Thank you, [facility]    It is flu season:   >>> Best ways to protect herself from the flu: Receive the yearly flu vaccine, practice good hand hygiene washing with soap and also using hand sanitizer when available, eat a nutritious meals, get adequate rest, hydrate appropriately       Please contact the office if your symptoms worsen or you have concerns that you are not improving.   Thank you for choosing [facility] for your healthcare, and for allowing [place] to partner with you on your healthcare journey. I am thankful to be able to provide care to you today.   Wyn Quaker FNP-C

## 2020-07-17 NOTE — Assessment & Plan Note (Signed)
Postnasal drip and rhinorrhea on exam today Eustachian tube dysfunction bilaterally  Plan: Start taking daily antihistamine regularly Start Flonase 1 spray each nostril daily I have encouraged patient to consider starting nasal saline rinses prior to Flonase use

## 2020-07-17 NOTE — Assessment & Plan Note (Signed)
Flu vaccine today 

## 2020-07-24 DIAGNOSIS — H26493 Other secondary cataract, bilateral: Secondary | ICD-10-CM | POA: Diagnosis not present

## 2020-07-24 DIAGNOSIS — H40013 Open angle with borderline findings, low risk, bilateral: Secondary | ICD-10-CM | POA: Diagnosis not present

## 2020-07-24 DIAGNOSIS — Z961 Presence of intraocular lens: Secondary | ICD-10-CM | POA: Diagnosis not present

## 2020-07-24 DIAGNOSIS — E119 Type 2 diabetes mellitus without complications: Secondary | ICD-10-CM | POA: Diagnosis not present

## 2020-07-24 LAB — HM DIABETES EYE EXAM

## 2020-07-25 ENCOUNTER — Other Ambulatory Visit: Payer: Self-pay | Admitting: Internal Medicine

## 2020-07-25 DIAGNOSIS — M62838 Other muscle spasm: Secondary | ICD-10-CM

## 2020-07-30 ENCOUNTER — Ambulatory Visit (HOSPITAL_COMMUNITY): Payer: Medicare Other | Attending: Cardiovascular Disease

## 2020-07-30 ENCOUNTER — Encounter: Payer: Self-pay | Admitting: Internal Medicine

## 2020-07-30 ENCOUNTER — Other Ambulatory Visit: Payer: Self-pay

## 2020-07-30 DIAGNOSIS — I5042 Chronic combined systolic (congestive) and diastolic (congestive) heart failure: Secondary | ICD-10-CM | POA: Diagnosis not present

## 2020-07-30 DIAGNOSIS — I4819 Other persistent atrial fibrillation: Secondary | ICD-10-CM | POA: Diagnosis not present

## 2020-07-30 DIAGNOSIS — I420 Dilated cardiomyopathy: Secondary | ICD-10-CM | POA: Insufficient documentation

## 2020-07-30 DIAGNOSIS — I10 Essential (primary) hypertension: Secondary | ICD-10-CM | POA: Diagnosis not present

## 2020-07-30 LAB — ECHOCARDIOGRAM COMPLETE
Area-P 1/2: 3.06 cm2
S' Lateral: 4.2 cm

## 2020-08-04 ENCOUNTER — Other Ambulatory Visit: Payer: Self-pay | Admitting: Podiatry

## 2020-08-04 NOTE — Telephone Encounter (Signed)
Please Advise

## 2020-08-13 ENCOUNTER — Other Ambulatory Visit: Payer: Self-pay | Admitting: Internal Medicine

## 2020-08-13 NOTE — Telephone Encounter (Signed)
Xarelto 20mg  refill request received. Pt is 74 years old, weight-106.2kg, Crea-1.25 on 07/10/2020, last seen by Dr. Radford Pax on 07/10/2020, Diagnosis-Afib, CrCl-66.26ml/min; Dose is appropriate based on dosing criteria. Will send in refill to requested pharmacy.

## 2020-08-14 NOTE — Progress Notes (Signed)
Patient Care Team: Isaac Bliss, Rayford Halsted, MD as PCP - General (Internal Medicine) Sueanne Margarita, MD as PCP - Cardiology (Cardiology) Eli Hose, [facility] (Inactive) as Pharmacist (Pharmacist)  DIAGNOSIS:    ICD-10-CM   1. Malignant neoplasm of central portion of left breast in female, estrogen receptor positive ([place])  C50.112    Z17.0     SUMMARY OF ONCOLOGIC HISTORY: Oncology History  Malignant neoplasm of central portion of left breast in female, estrogen receptor positive ([place])  2002 Initial Diagnosis   Left breast cancer treated with lumpectomy followed by adjuvant chemotherapy (does not remember chemotherapy drugs) and radiation.  Did not take antiestrogen therapy   07/11/2017 Relapse/Recurrence   Left breast skin papule; biopsy proven to be invasive mammary carcinoma ER 100% PR 90% positive HER-2 negative ratio 1.64; Right breast 1.5 cm indeterminate calcifications biopsy proven to be complex sclerosing lesion   08/15/2017 Surgery   Left breast excision: Invasive carcinoma involving the dermis, ER 100%, PR 90%, HER-2 negative ratio 1.64.  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 with above-mentioned history of recurrent left breast cancer treated with lumpectomy followed by mastectomy, and who currently on oral antiestrogen therapy with letrozole. Right breast mammogram on 08/27/19 showed a possible right breast mass. Diagnostic mammogram on 08/30/20 showed a mass at the 10 o'clock position  int he right breast. Biopsy on 09/05/19 showed a benign reactive lymph node. She presents to the clinic today for annual follow-up. She tells me that she is tolerating letrozole fairly well.  The joint stiffness is improved and hot flashes have also gone away.  ALLERGIES:  is allergic to tikosyn [dofetilide], zolpidem, codeine, codeine phosphate, and methylprednisolone.  MEDICATIONS:  Current Outpatient Medications  Medication Sig Dispense Refill  . amoxicillin (AMOXIL) 500 MG capsule Take 2,000 mg by mouth See admin instructions. Take 2000 mg by mouth 1 hour prior to dental appointment  5  . atorvastatin (LIPITOR) 40 MG tablet TAKE 1 TABLET BY MOUTH EVERYDAY AT BEDTIME 90 tablet 2  . BYSTOLIC 2.5 MG tablet TAKE 1 TABLET BY MOUTH EVERY DAY 90 tablet 1  . Cholecalciferol (VITAMIN D3) 2000 units capsule Take 2,000 Units by mouth daily.    . clotrimazole-betamethasone (LOTRISONE) cream Apply 1 application topically 2 (two) times daily. 30 g 0  . cyclobenzaprine (FLEXERIL) 5 MG tablet TAKE 1 TABLET BY MOUTH AT BEDTIME AS NEEDED FOR MUSCLE SPASMS 20 tablet 0  . fexofenadine (ALLEGRA) 180 MG tablet Take 180 mg by mouth daily as needed for allergies or rhinitis.    . fluconazole (DIFLUCAN) 150 MG tablet Take 1 tablet (150 mg total) by mouth once a week. 6 tablet 2  . fluticasone (FLONASE) 50 MCG/ACT nasal spray Place 1 spray into both nostrils daily as needed for allergies.     . furosemide (LASIX) 20 MG tablet TAKE 1 TABLET BY MOUTH TWICE A DAY 180 tablet 2  . gabapentin (NEURONTIN) 300 MG capsule TAKE 1 CAPSULE BY MOUTH EVERYDAY AT BEDTIME 30  capsule 0  . glucose blood test strip Use to check CBG AC and QHS. 200 each 12  . KLOR-CON M20 20 MEQ tablet TAKE 1 TABLET BY MOUTH EVERY DAY 90 tablet 1  . letrozole (FEMARA) 2.5 MG tablet TAKE 1 TABLET BY MOUTH EVERY DAY 90 tablet 3  . losartan (COZAAR) 25 MG tablet TAKE 1 TABLET BY MOUTH TWICE A DAY 180 tablet 3  . metFORMIN (GLUCOPHAGE) 500 MG tablet TAKE  1 TABLET BY MOUTH TWICE A DAY WITH MEALS 180 tablet 0  . pantoprazole (PROTONIX) 40 MG tablet TAKE 1 TABLET BY MOUTH EVERY DAY 90 tablet 3  . spironolactone (ALDACTONE) 25 MG tablet TAKE 1 TABLET BY MOUTH EVERY DAY 90 tablet 2  . traMADol (ULTRAM) 50 MG tablet Take 50 mg by mouth every 6 (six) hours as needed for moderate pain.    Marland Kitchen XARELTO 20 MG TABS tablet TAKE 1 TABLET BY MOUTH EVERY DAY WITH SUPPER 30 tablet 10   No current facility-administered medications for this visit.    PHYSICAL EXAMINATION: ECOG PERFORMANCE STATUS: 1 - Symptomatic but completely ambulatory  Vitals:   08/15/20 1121  BP: 136/79  Pulse: 68  Resp: 17  Temp: 98.2 F (36.8 C)  SpO2: 97%   Filed Weights   08/15/20 1121  Weight: 234 lb 9.6 oz (106.4 kg)    BREAST: No palpable masses or nodules in either right or left breasts. No palpable axillary supraclavicular or infraclavicular adenopathy no breast tenderness or nipple discharge. (exam performed in the presence of a chaperone)  LABORATORY DATA:  I have reviewed the data as listed CMP Latest Ref Rng & Units 07/10/2020 01/01/2020 08/16/2019  Glucose 65 - 99 mg/dL 997(E) 746(C) -  BUN 8 - 27 mg/dL 25 21 -  Creatinine 3.55 - 1.00 mg/dL 6.34(A) 9.58(I) 8.74(Y)  Sodium 134 - 144 mmol/L 142 140 -  Potassium 3.5 - 5.2 mmol/L 4.2 4.4 -  Chloride 96 - 106 mmol/L 105 103 -  CO2 20 - 29 mmol/L 21 22 -  Calcium 8.7 - 10.3 mg/dL 9.6 9.8 -  Total Protein 6.0 - 8.5 g/dL - 6.6 -  Total Bilirubin 0.0 - 1.2 mg/dL - 0.6 -  Alkaline Phos 39 - 117 IU/L - 155(H) -  AST 0 - 40 IU/L - 14 -  ALT 0 - 32 IU/L - 13 -    Lab Results  Component Value Date   WBC 9.1 07/10/2020   HGB 12.0 07/10/2020   HCT 36.8 07/10/2020   MCV 88 07/10/2020   PLT 321 07/10/2020   NEUTROABS 7.5 02/03/2017    ASSESSMENT & PLAN:  Malignant neoplasm of central portion of left breast in female, estrogen receptor positive ([facility]) 10/27/2017: Left simple mastectomy: Recurrent multifocal IDC grade 2  largest spanning 3.5 cm, 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 Nonspecific pulmonary nodule left upper lobe 3 mm  Oncotype testing could not be performed because it was felt that the tumor was a recurrent cancer and Oncotype DX has no ability in this situation.   Recommendation: adjuvant antiestrogen therapy with letrozole 2.5 mg dailystarted 11/14/2017 (I offered her systemic chemotherapy but patient declined)  Letrozole toxicities: Hot flashes and sweats:  Resolved Joint stiffness also improved  She is otherwise tolerating letrozole well.  Breast cancer surveillance: 1.Mammogram  08/31/2019: Suspicious mass at 10 o'clock position right breast 0.6 cm, a second oval mass 0.7 cm (intramammary lymph node): Biopsy: Benign reactive lymph node 2.breast exam 08/14/2020: Benign  Left posterior chest wall nodule: CT chest 10/03/2019: No evidence of metastatic disease.   Return to clinic in 1 year for follow-up    No orders of the defined types were placed in this encounter.  The patient has a good understanding of the overall plan. she agrees with it. she will call with any problems that may develop before the next visit here.  Total time spent: 20 mins including face to face time and time spent for planning, charting and coordination of care  Nicholas Lose, MD 08/15/2020  I, Cloyde Reams Dorshimer, am acting as scribe for Dr. Nicholas Lose.  I have reviewed the above documentation for accuracy and completeness, and I agree with the above.

## 2020-08-15 ENCOUNTER — Inpatient Hospital Stay: Payer: Medicare Other | Attending: Hematology and Oncology | Admitting: Hematology and Oncology

## 2020-08-15 ENCOUNTER — Other Ambulatory Visit: Payer: Self-pay

## 2020-08-15 DIAGNOSIS — Z7984 Long term (current) use of oral hypoglycemic drugs: Secondary | ICD-10-CM | POA: Insufficient documentation

## 2020-08-15 DIAGNOSIS — Z170 Estrogen receptor positive status [ER+]: Secondary | ICD-10-CM

## 2020-08-15 DIAGNOSIS — Z79899 Other long term (current) drug therapy: Secondary | ICD-10-CM | POA: Diagnosis not present

## 2020-08-15 DIAGNOSIS — C50112 Malignant neoplasm of central portion of left female breast: Secondary | ICD-10-CM

## 2020-08-15 DIAGNOSIS — Z79811 Long term (current) use of aromatase inhibitors: Secondary | ICD-10-CM | POA: Insufficient documentation

## 2020-08-15 DIAGNOSIS — Z7901 Long term (current) use of anticoagulants: Secondary | ICD-10-CM | POA: Insufficient documentation

## 2020-08-15 DIAGNOSIS — R911 Solitary pulmonary nodule: Secondary | ICD-10-CM | POA: Diagnosis not present

## 2020-08-15 DIAGNOSIS — Z9012 Acquired absence of left breast and nipple: Secondary | ICD-10-CM | POA: Insufficient documentation

## 2020-08-15 MED ORDER — LETROZOLE 2.5 MG PO TABS
2.5000 mg | ORAL_TABLET | Freq: Every day | ORAL | 3 refills | Status: DC
Start: 2020-08-15 — End: 2021-10-05

## 2020-08-15 NOTE — Assessment & Plan Note (Signed)
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 Nonspecific pulmonary nodule left upper lobe 3 mm  Oncotype testing could not be performed because it was felt that the tumor was a recurrent cancer and Oncotype DX has no ability in this situation.   Recommendation: adjuvant antiestrogen therapy with letrozole 2.5 mg dailystarted 11/14/2017 (I offered her systemic chemotherapy but patient declined)  Letrozole toxicities: 1.Hot flashes and sweats Patient has chronic fatigue because of her cardiac issues.  She is otherwise tolerating letrozole well.  Breast cancer surveillance: 1.Mammogram  08/31/2019: Suspicious mass at 10 o'clock position right breast 0.6 cm, a second oval mass 0.7 cm (intramammary lymph node): Biopsy: Benign reactive lymph node 2.breast exam 08/14/2020: Benign  Left posterior chest wall nodule: CT chest 10/03/2019: No evidence of metastatic disease.   Return to clinic in 1 year for follow-up

## 2020-08-18 ENCOUNTER — Other Ambulatory Visit: Payer: Self-pay | Admitting: Internal Medicine

## 2020-08-18 DIAGNOSIS — M62838 Other muscle spasm: Secondary | ICD-10-CM

## 2020-08-21 ENCOUNTER — Ambulatory Visit: Payer: Medicare Other | Admitting: Internal Medicine

## 2020-08-21 ENCOUNTER — Encounter: Payer: Self-pay | Admitting: Internal Medicine

## 2020-08-21 ENCOUNTER — Other Ambulatory Visit: Payer: Self-pay

## 2020-08-21 VITALS — BP 116/64 | HR 69 | Temp 98.3°F | Ht 66.0 in | Wt 234.2 lb

## 2020-08-21 DIAGNOSIS — I519 Heart disease, unspecified: Secondary | ICD-10-CM | POA: Diagnosis not present

## 2020-08-21 DIAGNOSIS — E669 Obesity, unspecified: Secondary | ICD-10-CM

## 2020-08-21 DIAGNOSIS — R0602 Shortness of breath: Secondary | ICD-10-CM | POA: Diagnosis not present

## 2020-08-21 NOTE — Addendum Note (Signed)
Addended by: Merrilee Seashore on: 08/21/2020 04:56 PM   Modules accepted: Orders

## 2020-08-21 NOTE — Patient Instructions (Addendum)
ICD-10-CM   1. Shortness of breath  R06.02   2. Chronic systolic dysfunction of left ventricle  I51.9   3. Obesity (BMI 30-39.9)  E66.9     Based on the fact have a clear chest x-ray in September 2021 and a normal walking desaturation test in September 2021 and a CT scan of the chest without pulmonary fibrosis in December 2020 and the fact you are off amiodarone I think the current shortness of breath is related to your weight, physical deconditioning and chronic systolic dysfunction  In addition improvement of your pulmonary function test between 2019 and 2020 suggest that your heart back then was reason for shortness of breath  Plan -Refer to pulmonary rehabilitation -Repeat full pulmonary function test in 6-8 months  Follow-up  - return to see me after completing pulmonary rehabilitation and pulmonary function test; 15-minute visit okay returns for follow-up.

## 2020-08-21 NOTE — Progress Notes (Signed)
Patient ID: Cynthia Herrera, female    DOB: 1946/08/31, 74 y.o.   MRN: [id number]  PCP Patient, No Pcp Per   HPI IOV 09/20/2018  Chief Complaint  Patient presents with  . Consult    referred by [name] MD for abnormal PFTs. pt notes some dyspnea with exertion that is at baseline.   74 year old obese lady with chronic systolic heart failure EF < 35% (aug 2019(, < 25 in may 2019, march 2015) and atrial fibrillation on chronic amiodarone therapy 200 mg a day for the last several years according to history.  She tells me that she has insidious onset of shortness of breath for the last few years.  It has been so gradual in onset she does not remember when but she believes it is a few years.  Definitely not several years.  She feels its insidious worsening of shortness of breath.  Initially she denied any associated cough but then later she said for the last several months has had some cough occasional with some yellow sputum.  But definitely her shortness of breath is progressive very mildly and present on exertion relieved by rest no associated chest pain.  No orthopnea proximal nocturnal dyspnea or worsening edema or change in weight.  No fever or chills.  She had pulmonary function test that showed isolated reduction in diffusion capacity.  I personally visualized the scan.  Rest of lab work-up as mentioned below.  Of note, in the last 3 weeks she has had acute worsening of chronic back pain but she believes this is unrelated to her dyspnea.   Exam nitric oxide today in the office: 09/20/2018 - 21 ppb and normal  She has had 2 CT scans of the chest 1 in 2014 December that I personally visualized and shows diffuse groundglass opacities.  She has another CT scan in #2018 there is more recent that I personally visualized and this shows essentially clear lung fields except scattered lung nodules.  She had a cardiac stress test June 2019 that is reported as low risk  Echocardiogram  August 2019 with chronic systolic heart failure with EF 35% and grade 1 diastolic dysfunction  Blood work October 2019 with a creatinine 1.42 mg percent and a low GFR of 36 and a hemoglobin of 12.5 g% that is all stable in March 2019   OV 11/02/2018  Subjective:  Patient ID: Cynthia Herrera, female , DOB: May 29, 1946 , age 79 y.o. , MRN: [id number] , ADDRESS: [address] [place] [facility] [address]   11/02/2018 -   Chief Complaint  Patient presents with  . Follow-up    Pt states she has been doing good since last visit. States her breathing is about the same. Has an occ cough from allergies.     HPI Cynthia Herrera 74 y.o. -has history of amiodarone therapy, radiation therapy and previous smoking.  She was referred to [place] for abnormal pulmonary function test low DLCO.  We did a high-resolution CT chest and this showed possibility of early ILD [indeterminate pattern for UIP which means less than 40% chance that this is indeed pathological UIP/IPF].  We then subjected autoimmune vasculitis panel early January 2020 and this is returned normal.  She is here to discuss all the test results.   [place] Integrated Comprehensive ILD Questionnaire  Symptoms: She is very categorical that she has had chronic systolic heart failure for many years and ejection fraction recently has improved but the shortness of breath  is only present for the last several months.  Insidious onset and gradually getting worse in the face of improving ejection fraction.  Currently has level 5 dyspnea for walking up stairs and walking up a hill.  And level for dyspnea for walking on level at her own pace.  At rest she has level 1 dyspnea and for ADLs she has level 2 dyspnea.  She denies any cough or wheezing or any other symptoms   Past Medical History : She has chronic systolic heart failure.  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  Denies any asthma or autoimmune or vasculitis.  Denies sleep apnea.   Denies any thyroid disease stroke or seizures.  Denies hepatitis.   ROS: Positive for fatigue but no arthralgia.  Over the last several months she is lost 25 pounds.  No nausea vomiting or heartburn or rash or ulcers   FAMILY HISTORY of LUNG DISEASE: No family for pulmonary fibrosis but dad and sister had COPD   EXPOSURE HISTORY: She did smoke from 1968 2000 to 1-1/2 packs a day.  She is lived in the same house.  Does not vape or use marijuana or cocaine.   HOME and HOBBY DETAILS : [place].  Age of the home is 48 years.  Over a year ago with the rains she had to like extensive water standing under the house but there is no mold exposure.  Does not use CPAP.  Does not use neb machine.  Does use a steam iron but does not have a mold.  No fountain inside the house no pet birds.  No use of feather blankets or pillows.  No mold in the [facility] duct.  Does not play any wind instruments.  Does do gardening but she uses potted soil.  The soil is never damp.  No damp exposure with gardening   OCCUPATIONAL HISTORY (122 questions) : Positive for gardening but no mold exposure.  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  In the 1970s and 1980s worked at  [employer] and was exposed to [employer].  She is worked in a dusty environment with furniture for 20 years   Gann (27 items): Got radiation therapy to the breast between 2002 and 2003.  Got cancer chemotherapy at that time.  Has been on amiodarone now for the last few years        Results for SHATAVIA, SANTOR (MRN [id number]) as of 09/20/2018 12:08  Ref. Range 08/24/2018 08:49  FVC-Pre Latest Units: L 2.77  FVC-%Pred-Pre Latest Units: % 87  FEV1-Pre Latest Units: L 2.28  FEV1-%Pred-Pre Latest Units: % 95  Pre FEV1/FVC ratio Latest Units: % 83  FEV1FVC-%Pred-Pre Latest Units: % 109   Results for CHISOM, MUNTEAN (MRN [id number]) as of 09/20/2018 12:08   Ref. Range 08/24/2018 08:49  TLC Latest Units: L 4.56  TLC % pred Latest Units: % 85   Results for RUARI, DUGGAN (MRN [id number]) as of 09/20/2018 12:08  Ref. Range 08/24/2018 08:49  DLCO unc Latest Units: ml/min/mmHg 15.65  DLCO unc % pred Latest Units: % 58    Simple office walk 250 feet x  3 laps goal with forehead probe 11/02/2018   O2 used Room air  Number laps completed 2 and stopped due to dyspnea, and back pain  Comments about pace nomral  Resting Pulse Ox/HR 99% and 58/min  Final Pulse  Ox/HR 98% and 93/min  Desaturated </= 88% no  Desaturated <= 3% points no  Got Tachycardic >/= 90/min yes  Symptoms at end of test dyspne and back pain  Miscellaneous comments Stopped at 2 laps       CT chest high resolution 1210/19 IMPRESSION: 1. Extremely subtle ground-glass attenuation noted throughout the lungs bilaterally. This is nonspecific. The possibility of early interstitial lung disease is not entirely excluded. If there's persistent clinical concern for interstitial lung disease, a repeat high-resolution chest CT would be recommended in 12 months to assess for temporal changes in the appearance of the lung parenchyma. 2. No imaging stigmata of amiodarone induced pulmonary toxicity. 3. Mild air trapping, indicative of mild small airways disease. 4. Aortic atherosclerosis, in addition to left main and 3 vessel coronary artery disease. Assessment for potential risk factor modification, dietary therapy or pharmacologic therapy may be warranted, if clinically indicated. 5. Splenomegaly. 6. New postoperative changes of left modified radical mastectomy with postoperative fluid collection associated with the left pectoralis musculature measuring 2.6 x 10.4 cm, likely a postoperative seroma.  Aortic Atherosclerosis (ICD10-I70.0).   Electronically Signed   By: Vinnie Langton M.D.   On: 10/03/2018 20:23    Results for FIANA, GLADU (MRN [id number]) as of  11/02/2018 12:17  Ref. Range 10/26/2018 14:06 10/26/2018 14:15  ASPERGILLUS FUMIGATUS Latest Ref Range: NEGATIVE  NEGATIVE   [place] Serum Latest Ref Range: NEGATIVE  NEGATIVE   Anti Nuclear Antibody(ANA) Latest Ref Range: NEGATIVE  NEGATIVE   Angiotensin-Converting Enzyme Latest Ref Range: 9 - 67 U/L 75 (H)   Anti JO-1 Latest Ref Range: 0.0 - 0.9 AI <0.2   CENTROMERE AB SCREEN Latest Ref Range: <1.0 NEG AI <0.8 NEG   Cyclic Citrullin Peptide Ab Latest Units: UNITS <16   ds DNA Ab Latest Units: IU/mL 1   ENA RNP Ab Latest Ref Range: 0.0 - 0.9 AI <0.2   Myeloperoxidase Abs Latest Units: AI <1.0   Serine Protease 3 Latest Units: AI <1.0   RA Latex Turbid. Latest Ref Range: <14 IU/mL <14   ENA SM Ab Ser-aCnc Latest Ref Range: <1.0 NEG AI  <1.0 NEG  SSA (Ro) (ENA) Antibody, IgG Latest Ref Range: <1.0 NEG AI <1.0 NEG   SSB (La) (ENA) Antibody, IgG Latest Ref Range: <1.0 NEG AI <1.0 NEG   Scleroderma (Scl-70) (ENA) Antibody, IgG Latest Ref Range: <1.0 NEG AI <1.0 NEG     ROS - per HPI  OV 08/21/2020  Subjective:  Patient ID: Cynthia Herrera, female , DOB: 08/20/1946 , age 40 y.o. , MRN: [id number] , ADDRESS: [address] [place] [address] PCP Isaac Bliss, Rayford Halsted, MD Patient Care Team: Isaac Bliss, Rayford Halsted, MD as PCP - General (Internal Medicine) Sueanne Margarita, MD as PCP - Cardiology (Cardiology) Audery Amel Sharma Covert, [facility] (Inactive) as Pharmacist (Pharmacist)  This Provider for this visit: Treatment Team:  Attending Provider: Brand Males, MD    08/21/2020 -   Chief Complaint  Patient presents with  . Follow-up    shortness of breath with activity   follow-up dyspnea with concern for interstitial lung disease in January 2020   HPI Cynthia Herrera 74 y.o. -  I personally not seen her since January 2020.  At the time ILD was a concern because of amiodarone therapy and radiation therapy.  However with follow-up and treatment of congestive heart failure and  stopping amiodarone her pulmonary function test improved by June 2020 it was normal.  December 2020 high-resolution CT did not show any pulmonary fibrosis.  She now returns for follow-up because she continues have persistent dyspnea with exertion relieved by rest.  She does not have any wheezing.  She says in the summer months it was worse but with the onset of fall it is better.  Early morning she has mucus congestion from postnasal drip but otherwise okay.  Overall she is stable.  She had echocardiogram in October 2021 and her ejection fraction has improved although still low.  She is on respiration recently chest x-ray is clear.  And per her report walking desaturation test in the office was normal.  There is no wheezing or cough orthopnea proximal nocturnal dyspnea edema.  There is no weight loss.  She is obese.    PFT Results Latest Ref Rng & Units 04/18/2019 08/24/2018 12/05/2014  FVC-Pre L 2.81 2.77 2.76  FVC-Predicted Pre % 89 87 81  FVC-Post L - 2.71 2.72  FVC-Predicted Post % - 85 80  Pre FEV1/FVC % % 84 83 85  Post FEV1/FCV % % - 86 84  FEV1-Pre L 2.35 2.28 2.34  FEV1-Predicted Pre % 99 95 90  FEV1-Post L - 2.32 2.30  DLCO uncorrected ml/min/mmHg 17.52 15.65 18.54  DLCO UNC% % 85 58 65  DLVA Predicted % 110 73 82  TLC L - 4.56 -  TLC % Predicted % - 85 -  RV % Predicted % - 84 -   IMPRESSION: 1. No evidence of fibrotic interstitial lung disease. No evidence of amiodarone toxicity. 2. Air trapping is indicative of small airways disease. 3. Aortic atherosclerosis (ICD10-170.0). Coronary artery calcification. 4. Enlarged pulmonic trunk, indicative of pulmonary arterial hypertension.   Electronically Signed   By: Lorin Picket M.D.   On: 10/03/2019 15:00  IMPRESSION: Cardiac enlargement.  No acute abnormality.   Electronically Signed   By: Franchot Gallo M.D.   On: 07/18/2020 10:54 ROS - per HPI     has a past medical history of Arthritis, Breast cancer,  left breast ([place]) (2002), Breast cancer, left breast ([place]) (07/14/2017), Chronic back pain, Chronic systolic CHF (congestive heart failure) ([place]), Complication of anesthesia, COPD (chronic obstructive pulmonary disease) ([place]), DCM (dilated cardiomyopathy) ([place]), Dyspnea, Gallstones, GERD (gastroesophageal reflux disease), History of bronchitis, Hyperlipidemia, Hypertension, Joint pain, Muscle spasm, Nocturia, OSA (obstructive sleep apnea) (08/22/2018), Osteopenia, Persistent atrial fibrillation ([place]), Personal history of colonic polyps, Pneumonia, Seasonal allergies, Thyroid nodule, Type II diabetes mellitus ([place]), and Vitamin D deficiency.   reports that she quit smoking about 19 years ago. Her smoking use included cigarettes. She has a 52.50 pack-year smoking history. She has never used smokeless tobacco.  Past Surgical History:  Procedure Laterality Date  . ATRIAL FIBRILLATION ABLATION N/A 12/12/2018   Procedure: ATRIAL FIBRILLATION ABLATION;  Surgeon: Thompson Grayer, MD;  Location: [facility];  Service: Cardiovascular;  Laterality: N/A;  . AXILLARY LYMPH NODE DISSECTION Left 10/2001   persistent intramammary node Archie Endo 03/09/2011  . BACK SURGERY    . BREAST BIOPSY Left 2002  . BREAST BIOPSY Right 06/2017   "node bx was negative"  . BREAST BIOPSY Left 06/2017   "positive for cancer"  . BREAST LUMPECTOMY WITH RADIOACTIVE SEED LOCALIZATION Right 08/15/2017   Procedure: RIGHT BREAST LUMPECTOMY WITH RADIOACTIVE SEED LOCALIZATION;  Surgeon: Jovita Kussmaul, MD;  Location: [place];  Service: General;  Laterality: Right;  . CARDIAC CATHETERIZATION  2015  . CARDIOVERSION N/A 11/12/2013   Procedure: CARDIOVERSION;  Surgeon: Thayer Headings, MD;  Location: [facility];  Service: Cardiovascular;  Laterality: N/A;  10:08  Dr. Marissa Nestle, anesthesia present, [place]   60mg ,  propofol 50mg , IV for elective cardioversion....Dr. Cathie Olden delievered synch 120 joules with successful cardioversion to NSR  .  CARDIOVERSION N/A 11/12/2013   Procedure: CARDIOVERSION;  Surgeon: Thayer Headings, MD;  Location: [facility];  Service: Cardiovascular;  Laterality: N/A;  . CARDIOVERSION N/A 11/14/2013   Procedure: CARDIOVERSION (BEDSIDE);  Surgeon: Larey Dresser, MD;  Location: [facility];  Service: Cardiovascular;  Laterality: N/A;  . CARDIOVERSION N/A 02/22/2018   Procedure: CARDIOVERSION;  Surgeon: Sueanne Margarita, MD;  Location: [facility] ENDOS[facility];  Service: Cardiovascular;  Laterality: N/A;  . CATARACT EXTRACTION W/ INTRAOCULAR LENS  IMPLANT, BILATERAL Bilateral ~ 2016  . COLONOSCOPY  2003, September 2008, April 01, 2011   adenomas 03 and 08, polyp 12, diverticulosis  . COLONOSCOPY WITH PROPOFOL N/A 11/16/2016   Procedure: COLONOSCOPY WITH PROPOFOL;  Surgeon: Gatha Mayer, MD;  Location: [facility];  Service: Endoscopy;  Laterality: N/A;  . JOINT REPLACEMENT    . LAPAROSCOPIC CHOLECYSTECTOMY  2004  . LEFT AND RIGHT HEART CATHETERIZATION WITH CORONARY ANGIOGRAM N/A 02/01/2014   Procedure: LEFT AND RIGHT HEART CATHETERIZATION WITH CORONARY ANGIOGRAM;  Surgeon: Sueanne Margarita, MD;  Location: [facility] C[facility];  Service: Cardiovascular;  Laterality: N/A;  . LUMBAR LAMINECTOMY/DECOMPRESSION MICRODISCECTOMY N/A 03/19/2014   Procedure: LUMBAR LAMINECTOMY/DECOMPRESSION MICRODISCECTOMY 4 LEVEL;  Surgeon: Kristeen Miss, MD;  Location: [place] [facility]O [facility];  Service: Neurosurgery;  Laterality: N/A;  L1-2 L2-3 L3-4 L4-5 Laminectomy/Foraminotomy  . MASS EXCISION Left 08/15/2017   Procedure: EXCISION OF LEFT BREAST MASS;  Surgeon: Jovita Kussmaul, MD;  Location: [facility];  Service: General;  Laterality: Left;  Marland Kitchen MASTECTOMY Left 10/27/2017  . MASTECTOMY PARTIAL / LUMPECTOMY W/ AXILLARY LYMPHADENECTOMY  05/08/2001   Archie Endo 03/09/2011  . PORT-A-CATH REMOVAL  2003  . PORTA CATH INSERTION  2002  . TEE WITHOUT CARDIOVERSION N/A 11/12/2013   Procedure: TRANSESOPHAGEAL ECHOCARDIOGRAM (TEE);  Surgeon: Thayer Headings, MD;  Location: [facility];   Service: Cardiovascular;  Laterality: N/A;  pt b/p low, pt buccal membranes very dry, lips scapped, pt c/o thirst. NPO since MN and iv FLUIDS TOTAL INFUSING AT TOTAL 20ML HR....  Dr. Cathie Olden order allow NS to bolus during procedure....very dry,NS bolus 250 ml total..pt responding well to meds..  . TOTAL KNEE ARTHROPLASTY Left 2006  . TOTAL KNEE ARTHROPLASTY Right 05/10/2016   Procedure: RIGHT TOTAL KNEE ARTHROPLASTY;  Surgeon: Gaynelle Arabian, MD;  Location: [facility];  Service: Orthopedics;  Laterality: Right;  With adductor block  . TOTAL MASTECTOMY Left 10/27/2017   Procedure: LEFT MASTECTOMY;  Surgeon: Jovita Kussmaul, MD;  Location: [facility];  Service: General;  Laterality: Left;  . TUBAL LIGATION      Allergies  Allergen Reactions  . Tikosyn [Dofetilide]     Prolonged QT/QTc  . Zolpidem Other (See Comments)    Hallucinations, up walking around ? Hallucinations, up walking around  . Codeine Other (See Comments)  . Codeine Phosphate Nausea And Vomiting  . Methylprednisolone Other (See Comments)    Felt really weird w the high dose oral steroid, but tolerates low doses or oral steroid Felt really weird w the high dose oral steroid, but tolerates low doses or oral steroid    Immunization History  Administered Date(s) Administered  . Fluad Quad(high Dose 65+) 07/18/2019  . Influenza Split 09/29/2011, 07/20/2012  . Influenza Whole 08/14/2008  . Influenza, High Dose Seasonal PF 09/09/2017, 07/17/2020  .  Influenza,inj,Quad PF,6+ Mos 10/23/2013, 08/20/2014, 08/14/2015, 08/27/2016, 08/09/2018  . PFIZER SARS-COV-2 Vaccination 12/15/2019, 01/08/2020  . Pneumococcal Conjugate-13 10/23/2013  . Pneumococcal Polysaccharide-23 06/18/2011  . Tdap 06/18/2011  . Zoster 07/20/2012    Family History  Problem Relation Age of Onset  . Dementia Sister   . Diabetes Sister   . Alzheimer's disease Sister   . Diabetes Brother        x 2  . Heart disease Brother   . Irritable bowel syndrome Brother   .  Liver cancer Mother   . Diabetes Mother   . Pancreatic cancer Mother   . Diabetes Father   . Heart disease Father      Current Outpatient Medications:  .  amoxicillin (AMOXIL) 500 MG capsule, Take 2,000 mg by mouth See admin instructions. Take 2000 mg by mouth 1 hour prior to dental appointment, Disp: , Rfl: 5 .  atorvastatin (LIPITOR) 40 MG tablet, TAKE 1 TABLET BY MOUTH EVERYDAY AT BEDTIME, Disp: 90 tablet, Rfl: 2 .  BYSTOLIC 2.5 MG tablet, TAKE 1 TABLET BY MOUTH EVERY DAY, Disp: 90 tablet, Rfl: 1 .  Cholecalciferol (VITAMIN D3) 2000 units capsule, Take 2,000 Units by mouth daily., Disp: , Rfl:  .  clotrimazole-betamethasone (LOTRISONE) cream, Apply 1 application topically 2 (two) times daily., Disp: 30 g, Rfl: 0 .  fexofenadine (ALLEGRA) 180 MG tablet, Take 180 mg by mouth daily as needed for allergies or rhinitis., Disp: , Rfl:  .  fluconazole (DIFLUCAN) 150 MG tablet, Take 1 tablet (150 mg total) by mouth once a week., Disp: 6 tablet, Rfl: 2 .  fluticasone (FLONASE) 50 MCG/ACT nasal spray, Place 1 spray into both nostrils daily as needed for allergies. , Disp: , Rfl:  .  furosemide (LASIX) 20 MG tablet, TAKE 1 TABLET BY MOUTH TWICE A DAY, Disp: 180 tablet, Rfl: 2 .  gabapentin (NEURONTIN) 300 MG capsule, TAKE 1 CAPSULE BY MOUTH EVERYDAY AT BEDTIME, Disp: 30 capsule, Rfl: 0 .  glucose blood test strip, Use to check CBG AC and QHS., Disp: 200 each, Rfl: 12 .  KLOR-CON M20 20 MEQ tablet, TAKE 1 TABLET BY MOUTH EVERY DAY, Disp: 90 tablet, Rfl: 1 .  letrozole (FEMARA) 2.5 MG tablet, Take 1 tablet (2.5 mg total) by mouth daily., Disp: 90 tablet, Rfl: 3 .  losartan (COZAAR) 25 MG tablet, TAKE 1 TABLET BY MOUTH TWICE A DAY, Disp: 180 tablet, Rfl: 3 .  metFORMIN (GLUCOPHAGE) 500 MG tablet, TAKE 1 TABLET BY MOUTH TWICE A DAY WITH MEALS, Disp: 180 tablet, Rfl: 0 .  pantoprazole (PROTONIX) 40 MG tablet, TAKE 1 TABLET BY MOUTH EVERY DAY, Disp: 90 tablet, Rfl: 3 .  spironolactone (ALDACTONE) 25 MG  tablet, TAKE 1 TABLET BY MOUTH EVERY DAY, Disp: 90 tablet, Rfl: 2 .  traMADol (ULTRAM) 50 MG tablet, Take 50 mg by mouth every 6 (six) hours as needed for moderate pain., Disp: , Rfl:  .  XARELTO 20 MG TABS tablet, TAKE 1 TABLET BY MOUTH EVERY DAY WITH SUPPER, Disp: 30 tablet, Rfl: 10      Objective:   Vitals:   08/21/20 1539  BP: 116/64  Pulse: 69  Temp: 98.3 F (36.8 C)  TempSrc: Other (Comment)  SpO2: 95%  Weight: 234 lb 3.2 oz (106.2 kg)  Height: 5\' 6"  (1.676 m)    Estimated body mass index is 37.8 kg/m as calculated from the following:   Height as of this encounter: 5\' 6"  (1.676 m).   Weight as of this  encounter: 234 lb 3.2 oz (106.2 kg).  @WEIGHTCHANGE @  [employer]   08/21/20 1539  Weight: 234 lb 3.2 oz (106.2 kg)     Physical Exam  General: No distress. obese Neuro: Alert and Oriented x 3. GCS 15. Speech normal Psych: Pleasant Resp:  Barrel Chest - no.  Wheeze - no, Crackles - no, No overt respiratory distress CVS: Normal heart sounds. Murmurs - no Ext: Stigmata of Connective Tissue Disease - no HEENT: Normal upper airway. PEERL +. No post nasal drip        Assessment:       ICD-10-CM   1. Shortness of breath  R06.02   2. Chronic systolic dysfunction of left ventricle  I51.9   3. Obesity (BMI 30-39.9)  E66.9        Plan:     Patient Instructions     ICD-10-CM   1. Shortness of breath  R06.02   2. Chronic systolic dysfunction of left ventricle  I51.9   3. Obesity (BMI 30-39.9)  E66.9     Based on the fact have a clear chest x-ray in September 2021 and a normal walking desaturation test in September 2021 and a CT scan of the chest without pulmonary fibrosis in December 2020 and the fact you are off amiodarone I think the current shortness of breath is related to your weight, physical deconditioning and chronic systolic dysfunction  In addition improvement of your pulmonary function test between 2019 and 2020 suggest that your heart back then  was reason for shortness of breath  Plan -Refer to pulmonary rehabilitation -Repeat full pulmonary function test in 6-8 months  Follow-up  - return to see me after completing pulmonary rehabilitation and pulmonary function test; 15-minute visit okay returns for follow-up.     SIGNATURE    Dr. Brand Males, M.D., F.C.C.P,  Pulmonary and Critical Care Medicine Staff Physician, [place] Director - Interstitial Lung Disease  Program  Pulmonary [place] at [place], [place], [address]  Pager: [phone], If no answer or between  15:00h - 7:00h: call [phone] Telephone: [phone]  4:17 PM 08/21/2020

## 2020-08-29 ENCOUNTER — Encounter (HOSPITAL_COMMUNITY): Payer: Self-pay | Admitting: *Deleted

## 2020-08-29 NOTE — Progress Notes (Signed)
Received referral from Dr. Chase Caller for this pt to participate in pulmonary rehab with the the diagnosis of chronic Systolic heart failure.  Pt most recent echo showed EF 45-50%.  Clinical review of pt follow up appt on 10/28  with Dr. Chase Caller and 9/23 with Wyn Quaker NP Pulmonary office note. Also reviewed progress note with Dr. Radford Pax on 9/16.   Pt with Covid Risk Score - 9 Pt appropriate for scheduling for Pulmonary rehab.  Will forward to support staff for scheduling and verification of insurance eligibility/benefits with pt consent. Cherre Huger, BSN Cardiac and Training and development officer

## 2020-09-02 ENCOUNTER — Telehealth (HOSPITAL_COMMUNITY): Payer: Self-pay

## 2020-09-02 ENCOUNTER — Encounter (HOSPITAL_COMMUNITY): Payer: Self-pay

## 2020-09-02 NOTE — Telephone Encounter (Signed)
Advised pt that once she comes to her pulmonary rehab orientation that's where she will find out when her first pulmonary rehab session starts and will be scheduled for the remaining sessions.

## 2020-09-02 NOTE — Telephone Encounter (Signed)
Attempted to call pt in regards to pulmonary rehab LMTCB. Mailed letter

## 2020-09-02 NOTE — Telephone Encounter (Signed)
Pt insurance is active and benefits verified through [facility] Medicare Co-pay $20, DED 0/0 met, out of pocket $3,600/$348.13 met, co-insurance 0%. no pre-authorization required. Passport, Joann/[employer] 09/02/2020@9 :16am, REF# [id number]

## 2020-09-03 ENCOUNTER — Other Ambulatory Visit: Payer: Self-pay | Admitting: Podiatry

## 2020-09-04 NOTE — Telephone Encounter (Signed)
Please advise 

## 2020-09-12 ENCOUNTER — Encounter: Payer: Self-pay | Admitting: Internal Medicine

## 2020-09-12 ENCOUNTER — Ambulatory Visit (INDEPENDENT_AMBULATORY_CARE_PROVIDER_SITE_OTHER): Payer: Medicare Other | Admitting: Internal Medicine

## 2020-09-12 ENCOUNTER — Other Ambulatory Visit: Payer: Self-pay

## 2020-09-12 VITALS — BP 110/78 | HR 65 | Temp 98.0°F | Wt 235.1 lb

## 2020-09-12 DIAGNOSIS — E1140 Type 2 diabetes mellitus with diabetic neuropathy, unspecified: Secondary | ICD-10-CM

## 2020-09-12 MED ORDER — GABAPENTIN 300 MG PO CAPS: 600.0000 mg | ORAL_CAPSULE | Freq: Every day | ORAL | 1 refills | Status: DC

## 2020-09-12 NOTE — Progress Notes (Signed)
Established Patient Office Visit     This visit occurred during the SARS-CoV-2 public health emergency.  Safety protocols were in place, including screening questions prior to the visit, additional usage of staff PPE, and extensive cleaning of exam room while observing appropriate contact time as indicated for disinfecting solutions.    CC/Reason for Visit: Numbness and tingling in legs and feet  HPI: Cynthia Herrera is a 74 y.o. female who is coming in today for the above mentioned reasons. Past Medical History is significant for: hx of breast cas/p left mastectomy, arthritis,chronic combinedCHF, COPD, hypertension, hyperlipidemia, OSA, osteopenia, thyroid nodule and DM II,also chronic A. fib.  She saw her podiatrist due to complaints of numbness and tingling on the soles of her feet and legs "it felt like fire".  She was started on Gabapentin 300 mg at nighttime.  This has significantly improved her leg pain but not so much the foot pain and numbness.  She is wondering what the next step is.   Past Medical/Surgical History: Past Medical History:  Diagnosis Date   Arthritis    Breast cancer, left breast ([place]) 2002   "then treated w/chemo and radiation"   Breast cancer, left breast ([place]) 07/14/2017   "tx'd w/mastectomy"   Chronic back pain    h/o lumbar stenosis; "no problem since my OR" (`10/27/2017)   Chronic systolic CHF (congestive heart failure) (HCC)    Complication of anesthesia    last surgery iv med when going to sleep"burned as injected"   COPD (chronic obstructive pulmonary disease) ([place])    no inhalers    DCM (dilated cardiomyopathy) ([place])    EF 15-20% ? tachycardia induced - EF 35-40% by echo 05/2018   Dyspnea    with exertion   Gallstones    GERD (gastroesophageal reflux disease)    once in a while;depends on what she eats   History of bronchitis    "chronic when I smoked; no problem since I quit in 2002" (10/27/2017)   Hyperlipidemia     Hypertension    takes Losartan and Metoprolol.    Joint pain    Muscle spasm    takes Flexeril daily as needed    Nocturia    OSA (obstructive sleep apnea) 08/22/2018   Severe obstructive sleep apnea with an AHI 47.3/h and no significant central sleep apnea with moderate oxygen desaturations as low as 79%   Osteopenia    Persistent atrial fibrillation (HCC)    s/p TEE DCCV and repeat DCCV 11/14/2013 and 02/2018   Personal history of colonic polyps    adenomas 03 and 08   Pneumonia    hx   Seasonal allergies    Thyroid nodule    Type II diabetes mellitus ([place])    Vitamin D deficiency    takes Vit D    Past Surgical History:  Procedure Laterality Date   ATRIAL FIBRILLATION ABLATION N/A 12/12/2018   Procedure: ATRIAL FIBRILLATION ABLATION;  Surgeon: Thompson Grayer, MD;  Location: [facility];  Service: Cardiovascular;  Laterality: N/A;   AXILLARY LYMPH NODE DISSECTION Left 10/2001   persistent intramammary node /notes 03/09/2011   BACK SURGERY     BREAST BIOPSY Left 2002   BREAST BIOPSY Right 06/2017   "node bx was negative"   BREAST BIOPSY Left 06/2017   "positive for cancer"   BREAST LUMPECTOMY WITH RADIOACTIVE SEED LOCALIZATION Right 08/15/2017   Procedure: RIGHT BREAST LUMPECTOMY WITH RADIOACTIVE SEED LOCALIZATION;  Surgeon: Autumn Messing III,  MD;  Location: [place] [facility];  Service: General;  Laterality: Right;   CARDIAC CATHETERIZATION  2015   CARDIOVERSION N/A 11/12/2013   Procedure: CARDIOVERSION;  Surgeon: Thayer Headings, MD;  Location: [facility] ENDOSCOPY;  Service: Cardiovascular;  Laterality: N/A;  10:08  Dr. Marissa Nestle, anesthesia present, [place]   60mg ,  propofol 50mg , IV for elective cardioversion....Dr. Cathie Olden delievered synch 120 joules with successful cardioversion to NSR   CARDIOVERSION N/A 11/12/2013   Procedure: CARDIOVERSION;  Surgeon: Thayer Headings, MD;  Location: [place];  Service: Cardiovascular;  Laterality: N/A;   CARDIOVERSION N/A 11/14/2013     Procedure: CARDIOVERSION (BEDSIDE);  Surgeon: Larey Dresser, MD;  Location: [place];  Service: Cardiovascular;  Laterality: N/A;   CARDIOVERSION N/A 02/22/2018   Procedure: CARDIOVERSION;  Surgeon: Sueanne Margarita, MD;  Location: [facility] ENDOSCOPY;  Service: Cardiovascular;  Laterality: N/A;   CATARACT EXTRACTION W/ INTRAOCULAR LENS  IMPLANT, BILATERAL Bilateral ~ 2016   COLONOSCOPY  2003, September 2008, April 01, 2011   adenomas 03 and 08, polyp 12, diverticulosis   COLONOSCOPY WITH PROPOFOL N/A 11/16/2016   Procedure: COLONOSCOPY WITH PROPOFOL;  Surgeon: Gatha Mayer, MD;  Location: [facility] ENDOSCOPY;  Service: Endoscopy;  Laterality: N/A;   JOINT REPLACEMENT     LAPAROSCOPIC CHOLECYSTECTOMY  2004   LEFT AND RIGHT HEART CATHETERIZATION WITH CORONARY ANGIOGRAM N/A 02/01/2014   Procedure: LEFT AND RIGHT HEART CATHETERIZATION WITH CORONARY ANGIOGRAM;  Surgeon: Sueanne Margarita, MD;  Location: [facility];  Service: Cardiovascular;  Laterality: N/A;   LUMBAR LAMINECTOMY/DECOMPRESSION MICRODISCECTOMY N/A 03/19/2014   Procedure: LUMBAR LAMINECTOMY/DECOMPRESSION MICRODISCECTOMY 4 LEVEL;  Surgeon: Kristeen Miss, MD;  Location: [place] NEURO [facility];  Service: Neurosurgery;  Laterality: N/A;  L1-2 L2-3 L3-4 L4-5 Laminectomy/Foraminotomy   MASS EXCISION Left 08/15/2017   Procedure: EXCISION OF LEFT BREAST MASS;  Surgeon: Jovita Kussmaul, MD;  Location: [facility];  Service: General;  Laterality: Left;   MASTECTOMY Left 10/27/2017   MASTECTOMY PARTIAL / LUMPECTOMY W/ AXILLARY LYMPHADENECTOMY  05/08/2001   Archie Endo 03/09/2011   PORT-A-CATH REMOVAL  2003   PORTA CATH INSERTION  2002   TEE WITHOUT CARDIOVERSION N/A 11/12/2013   Procedure: TRANSESOPHAGEAL ECHOCARDIOGRAM (TEE);  Surgeon: Thayer Headings, MD;  Location: [place];  Service: Cardiovascular;  Laterality: N/A;  pt b/p low, pt buccal membranes very dry, lips scapped, pt c/o thirst. NPO since MN and iv FLUIDS TOTAL INFUSING AT TOTAL 20ML HR....  Dr. Cathie Olden order  allow NS to bolus during procedure....very dry,NS bolus 250 ml total..pt responding well to meds.Marland Kitchen   TOTAL KNEE ARTHROPLASTY Left 2006   TOTAL KNEE ARTHROPLASTY Right 05/10/2016   Procedure: RIGHT TOTAL KNEE ARTHROPLASTY;  Surgeon: Gaynelle Arabian, MD;  Location: [facility];  Service: Orthopedics;  Laterality: Right;  With adductor block   TOTAL MASTECTOMY Left 10/27/2017   Procedure: LEFT MASTECTOMY;  Surgeon: Jovita Kussmaul, MD;  Location: [place];  Service: General;  Laterality: Left;   TUBAL LIGATION      Social History:  reports that she quit smoking about 19 years ago. Her smoking use included cigarettes. She has a 52.50 pack-year smoking history. She has never used smokeless tobacco. She reports that she does not drink alcohol and does not use drugs.  Allergies: Allergies  Allergen Reactions   Tikosyn [Dofetilide]     Prolonged QT/QTc   Zolpidem Other (See Comments)    Hallucinations, up walking around ? Hallucinations, up walking around   Codeine Other (See Comments)   Codeine Phosphate Nausea  And Vomiting   Methylprednisolone Other (See Comments)    Felt really weird w the high dose oral steroid, but tolerates low doses or oral steroid Felt really weird w the high dose oral steroid, but tolerates low doses or oral steroid    Family History:  Family History  Problem Relation Age of Onset   Dementia Sister    Diabetes Sister    Alzheimer's disease Sister    Diabetes Brother        x 2   Heart disease Brother    Irritable bowel syndrome Brother    Liver cancer Mother    Diabetes Mother    Pancreatic cancer Mother    Diabetes Father    Heart disease Father      Current Outpatient Medications:    amoxicillin (AMOXIL) 500 MG capsule, Take 2,000 mg by mouth See admin instructions. Take 2000 mg by mouth 1 hour prior to dental appointment, Disp: , Rfl: 5   atorvastatin (LIPITOR) 40 MG tablet, TAKE 1 TABLET BY MOUTH EVERYDAY AT BEDTIME, Disp: 90 tablet, Rfl:  2   BYSTOLIC 2.5 MG tablet, TAKE 1 TABLET BY MOUTH EVERY DAY, Disp: 90 tablet, Rfl: 1   Cholecalciferol (VITAMIN D3) 2000 units capsule, Take 2,000 Units by mouth daily., Disp: , Rfl:    clotrimazole-betamethasone (LOTRISONE) cream, Apply 1 application topically 2 (two) times daily., Disp: 30 g, Rfl: 0   fexofenadine (ALLEGRA) 180 MG tablet, Take 180 mg by mouth daily as needed for allergies or rhinitis., Disp: , Rfl:    fluconazole (DIFLUCAN) 150 MG tablet, Take 1 tablet (150 mg total) by mouth once a week., Disp: 6 tablet, Rfl: 2   fluticasone (FLONASE) 50 MCG/ACT nasal spray, Place 1 spray into both nostrils daily as needed for allergies. , Disp: , Rfl:    furosemide (LASIX) 20 MG tablet, TAKE 1 TABLET BY MOUTH TWICE A DAY, Disp: 180 tablet, Rfl: 2   gabapentin (NEURONTIN) 300 MG capsule, Take 2 capsules (600 mg total) by mouth at bedtime., Disp: 180 capsule, Rfl: 1   glucose blood test strip, Use to check CBG AC and QHS., Disp: 200 each, Rfl: 12   KLOR-CON M20 20 MEQ tablet, TAKE 1 TABLET BY MOUTH EVERY DAY, Disp: 90 tablet, Rfl: 1   letrozole (FEMARA) 2.5 MG tablet, Take 1 tablet (2.5 mg total) by mouth daily., Disp: 90 tablet, Rfl: 3   losartan (COZAAR) 25 MG tablet, TAKE 1 TABLET BY MOUTH TWICE A DAY, Disp: 180 tablet, Rfl: 3   metFORMIN (GLUCOPHAGE) 500 MG tablet, TAKE 1 TABLET BY MOUTH TWICE A DAY WITH MEALS, Disp: 180 tablet, Rfl: 0   pantoprazole (PROTONIX) 40 MG tablet, TAKE 1 TABLET BY MOUTH EVERY DAY, Disp: 90 tablet, Rfl: 3   spironolactone (ALDACTONE) 25 MG tablet, TAKE 1 TABLET BY MOUTH EVERY DAY, Disp: 90 tablet, Rfl: 2   traMADol (ULTRAM) 50 MG tablet, Take 50 mg by mouth every 6 (six) hours as needed for moderate pain., Disp: , Rfl:    XARELTO 20 MG TABS tablet, TAKE 1 TABLET BY MOUTH EVERY DAY WITH SUPPER, Disp: 30 tablet, Rfl: 10  Review of Systems:  Constitutional: Denies fever, chills, diaphoresis, appetite change and fatigue.  HEENT: Denies photophobia,  eye pain, redness, hearing loss, ear pain, congestion, sore throat, rhinorrhea, sneezing, mouth sores, trouble swallowing, neck pain, neck stiffness and tinnitus.   Respiratory: Denies SOB, DOE, cough, chest tightness,  and wheezing.   Cardiovascular: Denies chest pain, palpitations and leg swelling.  Gastrointestinal: Denies nausea, vomiting, abdominal pain, diarrhea, constipation, blood in stool and abdominal distention.  Genitourinary: Denies dysuria, urgency, frequency, hematuria, flank pain and difficulty urinating.  Endocrine: Denies: hot or cold intolerance, sweats, changes in hair or nails, polyuria, polydipsia. Musculoskeletal: Denies myalgias, back pain, joint swelling, arthralgias and gait problem.  Skin: Denies pallor, rash and wound.  Neurological: Denies dizziness, seizures, syncope, weakness, light-headedness, and headaches.  Hematological: Denies adenopathy. Easy bruising, personal or family bleeding history  Psychiatric/Behavioral: Denies suicidal ideation, mood changes, confusion, nervousness, sleep disturbance and agitation    Physical Exam: Vitals:   09/12/20 1536  BP: 110/78  Pulse: 65  Temp: 98 F (36.7 C)  TempSrc: Oral  SpO2: 97%  Weight: 235 lb 1.6 oz (106.6 kg)    Body mass index is 37.95 kg/m.   Constitutional: NAD, calm, comfortable Eyes: PERRL, lids and conjunctivae normal ENMT: Mucous membranes are moist. Respiratory: clear to auscultation bilaterally, no wheezing, no crackles. Normal respiratory effort. No accessory muscle use.  Cardiovascular: Regular rate and rhythm, no murmurs / rubs / gallops. No extremity edema.  Neurologic: Grossly intact and nonfocal Psychiatric: Normal judgment and insight. Alert and oriented x 3. Normal mood.    Impression and Plan:  Type 2 diabetes mellitus with diabetic neuropathy, without long-term current use of insulin (HCC) -I will go ahead and increase her gabapentin dosing from 300 to 600 mg at nighttime. -She  will follow-up in 3 months or sooner as needed.   Patient Instructions  -Nice seeing you today!!  -Increase gabapentin to 600 mg at bedtime.  -Schedule follow up in 3 months.     Lelon Frohlich, MD [facility]

## 2020-09-12 NOTE — Patient Instructions (Signed)
-  Nice seeing you today!!  -Increase gabapentin to 600 mg at bedtime.  -Schedule follow up in 3 months.

## 2020-09-14 ENCOUNTER — Other Ambulatory Visit: Payer: Self-pay | Admitting: Internal Medicine

## 2020-09-24 ENCOUNTER — Other Ambulatory Visit: Payer: Self-pay | Admitting: General Surgery

## 2020-09-24 DIAGNOSIS — Z1231 Encounter for screening mammogram for malignant neoplasm of breast: Secondary | ICD-10-CM

## 2020-09-25 ENCOUNTER — Other Ambulatory Visit: Payer: Self-pay | Admitting: Internal Medicine

## 2020-09-25 ENCOUNTER — Other Ambulatory Visit: Payer: Self-pay | Admitting: Podiatry

## 2020-09-25 DIAGNOSIS — M62838 Other muscle spasm: Secondary | ICD-10-CM

## 2020-09-25 DIAGNOSIS — C50912 Malignant neoplasm of unspecified site of left female breast: Secondary | ICD-10-CM | POA: Diagnosis not present

## 2020-09-25 NOTE — Telephone Encounter (Signed)
Please advise 

## 2020-10-02 ENCOUNTER — Other Ambulatory Visit: Payer: Self-pay

## 2020-10-02 ENCOUNTER — Ambulatory Visit
Admission: RE | Admit: 2020-10-02 | Discharge: 2020-10-02 | Disposition: A | Discharge status: Home / Self Care | Payer: Medicare Other | Source: Ambulatory Visit | Attending: General Surgery | Admitting: General Surgery

## 2020-10-02 DIAGNOSIS — Z1231 Encounter for screening mammogram for malignant neoplasm of breast: Secondary | ICD-10-CM | POA: Diagnosis not present

## 2020-10-08 ENCOUNTER — Other Ambulatory Visit: Payer: Self-pay | Admitting: Internal Medicine

## 2020-10-08 DIAGNOSIS — I70209 Unspecified atherosclerosis of native arteries of extremities, unspecified extremity: Secondary | ICD-10-CM

## 2020-10-09 ENCOUNTER — Telehealth (HOSPITAL_COMMUNITY): Payer: Self-pay

## 2020-10-09 NOTE — Telephone Encounter (Signed)
Called pt to confirm appt with pulmonary rehab 10/10/20 @1030 . LMTCB with any questions or if she is unable to attend.

## 2020-10-10 ENCOUNTER — Ambulatory Visit (HOSPITAL_COMMUNITY): Payer: Medicare Other

## 2020-10-10 ENCOUNTER — Telehealth (HOSPITAL_COMMUNITY): Payer: Self-pay

## 2020-10-10 NOTE — Telephone Encounter (Signed)
Pt called to cancel PR orientation today, stated she just got back in town and is running late. Adv pt we will contact her in Jan to reschedule PR for Feb.

## 2020-10-16 NOTE — Progress Notes (Unsigned)
Subjective:   Cynthia Herrera is a 74 y.o. female who presents for Medicare Annual (Subsequent) preventive examination.  Review of Systems    N/A        Objective:    There were no vitals filed for this visit. There is no height or weight on file to calculate BMI.  Advanced Directives 12/12/2018 10/31/2018 08/22/2018 05/24/2018 03/21/2018 01/02/2018 10/27/2017  Does Patient Have a Medical Advance Directive? Yes Yes Yes Yes No Yes Yes  Type of Paramedic of [place];Living will Living will Living will Living will;Healthcare Power of [place] will  Does patient want to make changes to medical advance directive? No - Patient declined No - Patient declined - No - Patient declined - - No - Patient declined  Copy of [place] in Chart? No - copy requested - - Yes - Yes -  Would patient like information on creating a medical advance directive? - - - - No - Patient declined - -  Pre-existing out of facility DNR order (yellow form or pink MOST form) - - - - - - -    Current Medications (verified) Outpatient Encounter Medications as of 10/27/2020  Medication Sig   amoxicillin (AMOXIL) 500 MG capsule Take 2,000 mg by mouth See admin instructions. Take 2000 mg by mouth 1 hour prior to dental appointment   atorvastatin (LIPITOR) 40 MG tablet TAKE 1 TABLET BY MOUTH EVERYDAY AT BEDTIME   Cholecalciferol (VITAMIN D3) 2000 units capsule Take 2,000 Units by mouth daily.   clotrimazole-betamethasone (LOTRISONE) cream Apply 1 application topically 2 (two) times daily.   cyclobenzaprine (FLEXERIL) 5 MG tablet TAKE 1 TABLET BY MOUTH AT BEDTIME AS NEEDED FOR MUSCLE SPASMS   fexofenadine (ALLEGRA) 180 MG tablet Take 180 mg by mouth daily as needed for allergies or rhinitis.   fluconazole (DIFLUCAN) 150 MG tablet Take 1 tablet (150 mg total) by mouth once a week.   fluticasone (FLONASE) 50 MCG/ACT nasal spray  Place 1 spray into both nostrils daily as needed for allergies.    furosemide (LASIX) 20 MG tablet TAKE 1 TABLET BY MOUTH TWICE A DAY   gabapentin (NEURONTIN) 300 MG capsule Take 2 capsules (600 mg total) by mouth at bedtime.   glucose blood test strip Use to check CBG AC and QHS.   KLOR-CON M20 20 MEQ tablet TAKE 1 TABLET BY MOUTH EVERY DAY   letrozole (FEMARA) 2.5 MG tablet Take 1 tablet (2.5 mg total) by mouth daily.   losartan (COZAAR) 25 MG tablet TAKE 1 TABLET BY MOUTH TWICE A DAY   metFORMIN (GLUCOPHAGE) 500 MG tablet TAKE 1 TABLET BY MOUTH TWICE A DAY WITH MEALS   nebivolol (BYSTOLIC) 2.5 MG tablet TAKE 1 TABLET BY MOUTH EVERY DAY   pantoprazole (PROTONIX) 40 MG tablet TAKE 1 TABLET BY MOUTH EVERY DAY   spironolactone (ALDACTONE) 25 MG tablet TAKE 1 TABLET BY MOUTH EVERY DAY   traMADol (ULTRAM) 50 MG tablet Take 50 mg by mouth every 6 (six) hours as needed for moderate pain.   XARELTO 20 MG TABS tablet TAKE 1 TABLET BY MOUTH EVERY DAY WITH SUPPER   No facility-administered encounter medications on file as of 10/27/2020.    Allergies (verified) Tikosyn [dofetilide], Zolpidem, Codeine, Codeine phosphate, and Methylprednisolone   History: Past Medical History:  Diagnosis Date   Arthritis    Breast cancer, left breast ([place]) 2002   "then treated w/chemo and radiation"  Breast cancer, left breast ([place]) 07/14/2017   "tx'd w/mastectomy"   Chronic back pain    h/o lumbar stenosis; "no problem since my OR" (`10/27/2017)   Chronic systolic CHF (congestive heart failure) (HCC)    Complication of anesthesia    last surgery iv med when going to sleep"burned as injected"   COPD (chronic obstructive pulmonary disease) ([place])    no inhalers    DCM (dilated cardiomyopathy) ([place])    EF 15-20% ? tachycardia induced - EF 35-40% by echo 05/2018   Dyspnea    with exertion   Gallstones    GERD (gastroesophageal reflux disease)    once in a while;depends on what she eats    History of bronchitis    "chronic when I smoked; no problem since I quit in 2002" (10/27/2017)   Hyperlipidemia    Hypertension    takes Losartan and Metoprolol.    Joint pain    Muscle spasm    takes Flexeril daily as needed    Nocturia    OSA (obstructive sleep apnea) 08/22/2018   Severe obstructive sleep apnea with an AHI 47.3/h and no significant central sleep apnea with moderate oxygen desaturations as low as 79%   Osteopenia    Persistent atrial fibrillation (HCC)    s/p TEE DCCV and repeat DCCV 11/14/2013 and 02/2018   Personal history of colonic polyps    adenomas 03 and 08   Pneumonia    hx   Seasonal allergies    Thyroid nodule    Type II diabetes mellitus ([place])    Vitamin D deficiency    takes Vit D   Past Surgical History:  Procedure Laterality Date   ATRIAL FIBRILLATION ABLATION N/A 12/12/2018   Procedure: ATRIAL FIBRILLATION ABLATION;  Surgeon: Thompson Grayer, MD;  Location: [place] [facility];  Service: Cardiovascular;  Laterality: N/A;   AXILLARY LYMPH NODE DISSECTION Left 10/2001   persistent intramammary node /notes 03/09/2011   BACK SURGERY     BREAST BIOPSY Left 2002   BREAST BIOPSY Right 06/2017   "node bx was negative"   BREAST BIOPSY Left 06/2017   "positive for cancer"   BREAST LUMPECTOMY WITH RADIOACTIVE SEED LOCALIZATION Right 08/15/2017   Procedure: RIGHT BREAST LUMPECTOMY WITH RADIOACTIVE SEED LOCALIZATION;  Surgeon: Jovita Kussmaul, MD;  Location: [place];  Service: General;  Laterality: Right;   CARDIAC CATHETERIZATION  2015   CARDIOVERSION N/A 11/12/2013   Procedure: CARDIOVERSION;  Surgeon: Thayer Headings, MD;  Location: [place];  Service: Cardiovascular;  Laterality: N/A;  10:08  Dr. Marissa Nestle, anesthesia present, [place]   60mg ,  propofol 50mg , IV for elective cardioversion....Dr. Cathie Olden delievered synch 120 joules with successful cardioversion to NSR   CARDIOVERSION N/A 11/12/2013   Procedure: CARDIOVERSION;  Surgeon: Thayer Headings, MD;  Location: [facility];  Service: Cardiovascular;  Laterality: N/A;   CARDIOVERSION N/A 11/14/2013   Procedure: CARDIOVERSION (BEDSIDE);  Surgeon: Larey Dresser, MD;  Location: [facility];  Service: Cardiovascular;  Laterality: N/A;   CARDIOVERSION N/A 02/22/2018   Procedure: CARDIOVERSION;  Surgeon: Sueanne Margarita, MD;  Location: [facility];  Service: Cardiovascular;  Laterality: N/A;   CATARACT EXTRACTION W/ INTRAOCULAR LENS  IMPLANT, BILATERAL Bilateral ~ 2016   COLONOSCOPY  2003, September 2008, April 01, 2011   adenomas 03 and 08, polyp 12, diverticulosis   COLONOSCOPY WITH PROPOFOL N/A 11/16/2016   Procedure: COLONOSCOPY WITH PROPOFOL;  Surgeon: Gatha Mayer, MD;  Location: [facility] ENDOSCOPY;  Service: Endoscopy;  Laterality:  N/A;   JOINT REPLACEMENT     LAPAROSCOPIC CHOLECYSTECTOMY  2004   LEFT AND RIGHT HEART CATHETERIZATION WITH CORONARY ANGIOGRAM N/A 02/01/2014   Procedure: LEFT AND RIGHT HEART CATHETERIZATION WITH CORONARY ANGIOGRAM;  Surgeon: Sueanne Margarita, MD;  Location: [place] CATH LAB;  Service: Cardiovascular;  Laterality: N/A;   LUMBAR LAMINECTOMY/DECOMPRESSION MICRODISCECTOMY N/A 03/19/2014   Procedure: LUMBAR LAMINECTOMY/DECOMPRESSION MICRODISCECTOMY 4 LEVEL;  Surgeon: Kristeen Miss, MD;  Location: [place] NEURO ORS;  Service: Neurosurgery;  Laterality: N/A;  L1-2 L2-3 L3-4 L4-5 Laminectomy/Foraminotomy   MASS EXCISION Left 08/15/2017   Procedure: EXCISION OF LEFT BREAST MASS;  Surgeon: Jovita Kussmaul, MD;  Location: [place];  Service: General;  Laterality: Left;   MASTECTOMY Left 10/27/2017   MASTECTOMY PARTIAL / LUMPECTOMY W/ AXILLARY LYMPHADENECTOMY  05/08/2001   Archie Endo 03/09/2011   PORT-A-CATH REMOVAL  2003   PORTA CATH INSERTION  2002   TEE WITHOUT CARDIOVERSION N/A 11/12/2013   Procedure: TRANSESOPHAGEAL ECHOCARDIOGRAM (TEE);  Surgeon: Thayer Headings, MD;  Location: [place] [facility];  Service: Cardiovascular;  Laterality: N/A;  pt b/p low, pt buccal membranes very  dry, lips scapped, pt c/o thirst. NPO since MN and iv FLUIDS TOTAL INFUSING AT TOTAL 20ML HR....  Dr. Cathie Olden order allow NS to bolus during procedure....very dry,NS bolus 250 ml total..pt responding well to meds.Marland Kitchen   TOTAL KNEE ARTHROPLASTY Left 2006   TOTAL KNEE ARTHROPLASTY Right 05/10/2016   Procedure: RIGHT TOTAL KNEE ARTHROPLASTY;  Surgeon: Gaynelle Arabian, MD;  Location: [facility];  Service: Orthopedics;  Laterality: Right;  With adductor block   TOTAL MASTECTOMY Left 10/27/2017   Procedure: LEFT MASTECTOMY;  Surgeon: Jovita Kussmaul, MD;  [facility];  Service: General;  Laterality: Left;   TUBAL LIGATION     Family History  Problem Relation Age of Onset   Dementia Sister    Diabetes Sister    Alzheimer's disease Sister    Diabetes Brother        x 2   Heart disease Brother    Irritable bowel syndrome Brother    Liver cancer Mother    Diabetes Mother    Pancreatic cancer Mother    Diabetes Father    Heart disease Father    Social History   Socioeconomic History   Marital status: Widowed    Spouse name: Not on file   Number of children: 2   Years of education: Not on file   Highest education level: Not on file  Occupational History   Occupation: retired  Tobacco Use   Smoking status: Former Smoker    Packs/day: 1.50    Years: 35.00    Pack years: 52.50    Types: Cigarettes    Quit date: 08/24/2001    Years since quitting: 19.1   Smokeless tobacco: Never Used  Scientific laboratory technician Use: Never used  Substance and Sexual Activity   Alcohol use: No   Drug use: No   Sexual activity: Never    Birth control/protection: Post-menopausal  Other Topics Concern   Not on file  Social History Narrative   Not on file   Social Determinants of Health   Financial Resource Strain: Low Risk    Difficulty of Paying Living Expenses: Not very hard  Food Insecurity: Not on file  Transportation Needs: Not on file  Physical Activity: Inactive   Days of  Exercise per Week: 0 days   Minutes of Exercise per Session: 0 min  Stress: Not on file  Social Connections:  Not on file    Tobacco Counseling Counseling given: Not Answered   Clinical Intake:                 Diabetic?yes Nutrition Risk Assessment:  Has the patient had any N/V/D within the last 2 months?  {YES/NO:21197} Does the patient have any non-healing wounds?  {YES/NO:21197} Has the patient had any unintentional weight loss or weight gain?  {YES/NO:21197}  Diabetes:  Is the patient diabetic?  Yes  If diabetic, was a CBG obtained today?  No  Did the patient bring in their glucometer from home?  No  How often do you monitor your CBG's? ***.   Financial Strains and Diabetes Management:  Are you having any financial strains with the device, your supplies or your medication? {YES/NO:21197}.  Does the patient want to be seen by Chronic Care Management for management of their diabetes?  {YES/NO:21197} Would the patient like to be referred to a Nutritionist or for Diabetic Management?  {YES/NO:21197}  Diabetic Exams:  Diabetic Eye Exam: Completed 07/24/2020 Diabetic Foot Exam: Overdue, Pt has been advised about the importance in completing this exam. Pt is scheduled for diabetic foot exam on 12/16/2020.          Activities of Daily Living No flowsheet data found.  Patient Care Team: Isaac Bliss, Rayford Halsted, MD as PCP - General (Internal Medicine) Sueanne Margarita, MD as PCP - Cardiology (Cardiology) Audery Amel Sharma Covert, [facility] (Inactive) as Pharmacist (Pharmacist)  Indicate any recent Medical Services you may have received from other than [facility] providers in the past year (date may be approximate).     Assessment:   This is a routine wellness examination for [place].  Hearing/Vision screen No exam data present  Dietary issues and exercise activities discussed:    Goals     Chronic Care Management     CARE PLAN ENTRY  Current Barriers:   Chronic  Disease Management support, education, and care coordination needs related to Hypertension, Hyperlipidemia, and Diabetes   Hypertension BP Readings from Last 3 Encounters:  02/12/20 110/70  12/28/19 124/60  11/15/19 118/70    Pharmacist Clinical Goal(s): o Over the next 180 days, patient will work with PharmD and providers to maintain BP goal <140/90  Current regimen:   Losartan 25 mg 1 tablet twice daily  Spironolactone 25 mg 1 tablet daily  Furosemide 20 mg 1 tablet twice daily  Interventions: o Discussed low salt diet and exercising as tolerated extensively  Patient self care activities - Over the next 180 days, patient will: o Check BP once a week, document, and provide at future appointments o Ensure daily salt intake < 2300 mg/day  Hyperlipidemia Lab Results  Component Value Date/Time   LDLCALC 70 01/01/2020 11:43 AM    Pharmacist Clinical Goal(s): o Over the next 180 days, patient will work with PharmD and providers to maintain LDL goal < 100  Current regimen:  o Atorvastatin 40 mg 1 tablet daily at bedtime  Interventions: o Discussed low cholesterol diet and exercising as tolerated extensively  Patient self care activities - Over the next 180 days, patient will: o Continue with low cholesterol diet/weight management   Diabetes Lab Results  Component Value Date/Time   HGBA1C 6.7 (A) 02/12/2020 11:37 AM   HGBA1C 7.4 (A) 11/15/2019 02:00 PM   HGBA1C 6.2 (H) 01/02/2018 02:51 PM   HGBA1C 6.2 (H) 10/27/2017 08:06 AM    Pharmacist Clinical Goal(s): o Over the next 180 days, patient will work with PharmD  and providers to maintain A1c goal <7%  Current regimen:  o Metformin 500 mg 1 tablet twice daily with a meal  Interventions o Discussed carbohydrate counting and exercising as tolerated extensively  Patient self care activities - Over the next 180 days, patient will: o Check blood sugar in the morning before eating or drinking, document, and provide at  future appointments o Contact provider with any episodes of hypoglycemia  Medication management  Pharmacist Clinical Goal(s): o Over the next 180 days, patient will work with PharmD and providers to maintain optimal medication adherence  Current pharmacy: [facility]  Interventions o Comprehensive medication review performed. o Continue current medication management strategy  Patient self care activities - Over the next 180 days, patient will: o Focus on medication adherence by using weekly pill organizers o Take medications as prescribed o Report any questions or concerns to PharmD and/or provider(s)  Initial goal documentation      Patient Stated     Plan to have a dog by winter      Patient Stated     Exercise; go low and slow Tuesday and Thursday and tell whomever that you in rehab      Depression Screen PHQ 2/9 Scores 02/12/2020 01/02/2019 05/22/2018 02/03/2017 11/10/2015 03/15/2014  PHQ - 2 Score 0 0 0 0 0 0  PHQ- 9 Score 1 - - - - -    Fall Risk Fall Risk  02/12/2020 01/02/2019 05/22/2018 09/01/2017 02/03/2017  Falls in the past year? 0 0 No No No  Number falls in past yr: 0 0 - - -  Injury with Fall? 0 0 - - -    FALL RISK PREVENTION PERTAINING TO THE HOME:  Any stairs in or around the home? {YES/NO:21197} If so, are there any without handrails? No  Home free of loose throw rugs in walkways, pet beds, electrical cords, etc? Yes  Adequate lighting in your home to reduce risk of falls? Yes   ASSISTIVE DEVICES UTILIZED TO PREVENT FALLS:  Life alert? {YES/NO:21197} Use of a cane, walker or w/c? {YES/NO:21197} Grab bars in the bathroom? {YES/NO:21197} Shower chair or bench in shower? {YES/NO:21197} Elevated toilet seat or a handicapped toilet? {YES/NO:21197}    Cognitive Function: MMSE - Mini Mental State Exam 05/22/2018  Not completed: (No Data)        Immunizations Immunization History  Administered Date(s) Administered   Fluad Quad(high Dose 65+)  07/18/2019   Influenza Split 09/29/2011, 07/20/2012   Influenza Whole 08/14/2008   Influenza, High Dose Seasonal PF 09/09/2017, 07/17/2020   Influenza,inj,Quad PF,6+ Mos 10/23/2013, 08/20/2014, 08/14/2015, 08/27/2016, 08/09/2018   PFIZER SARS-COV-2 Vaccination 12/15/2019, 01/08/2020   Pneumococcal Conjugate-13 10/23/2013   Pneumococcal Polysaccharide-23 06/18/2011   Tdap 06/18/2011   Zoster 07/20/2012    TDAP status: Up to date  Flu Vaccine status: Up to date  Pneumococcal vaccine status: Up to date  Covid-19 vaccine status: Completed vaccines  Qualifies for Shingles Vaccine? Yes   Zostavax completed Yes   Shingrix Completed?: No.    Education has been provided regarding the importance of this vaccine. Patient has been advised to call insurance company to determine out of pocket expense if they have not yet received this vaccine. Advised may also receive vaccine at local pharmacy or Health Dept. Verbalized acceptance and understanding.  Screening Tests Health Maintenance  Topic Date Due   Hepatitis C Screening  Never done   DEXA SCAN  Never done   FOOT EXAM  05/03/2017   COVID-19 Vaccine (3 -  Pfizer risk 4-dose series) 02/05/2020   HEMOGLOBIN A1C  08/13/2020   TETANUS/TDAP  06/17/2021   OPHTHALMOLOGY EXAM  07/24/2021   COLONOSCOPY  11/16/2021   INFLUENZA VACCINE  Completed   PNA vac Low Risk Adult  Completed    Health Maintenance  Health Maintenance Due  Topic Date Due   Hepatitis C Screening  Never done   DEXA SCAN  Never done   FOOT EXAM  05/03/2017   COVID-19 Vaccine (3 - Pfizer risk 4-dose series) 02/05/2020   HEMOGLOBIN A1C  08/13/2020    Colorectal cancer screening: Type of screening: Colonoscopy. Completed 11/16/2016. Repeat every 5 years  Mammogram status: Completed 10/02/2020. Repeat every year  {Bone Density status:21018021}  Lung Cancer Screening: (Low Dose CT Chest recommended if Age 17-80 years, 30 pack-year currently  smoking OR have quit w/in 15years.) does not qualify.   Lung Cancer Screening Referral: N/A   Additional Screening:  Hepatitis C Screening: does qualify;   Vision Screening: Recommended annual ophthalmology exams for early detection of glaucoma and other disorders of the eye. Is the patient up to date with their annual eye exam?  {YES/NO:21197} Who is the provider or what is the name of the office in which the patient attends annual eye exams? *** If pt is not established with a provider, would they like to be referred to a provider to establish care? {YES/NO:21197}.   Dental Screening: Recommended annual dental exams for proper oral hygiene  Community Resource Referral / Chronic Care Management: CRR required this visit?  No   CCM required this visit?  No      Plan:     I have personally reviewed and noted the following in the patients chart:    Medical and social history  Use of alcohol, tobacco or illicit drugs   Current medications and supplements  Functional ability and status  Nutritional status  Physical activity  Advanced directives  List of other physicians  Hospitalizations, surgeries, and ER visits in previous 12 months  Vitals  Screenings to include cognitive, depression, and falls  Referrals and appointments  In addition, I have reviewed and discussed with patient certain preventive protocols, quality metrics, and best practice recommendations. A written personalized care plan for preventive services as well as general preventive health recommendations were provided to patient.     Ofilia Neas, LPN   [id number]   Nurse Notes: None

## 2020-10-27 ENCOUNTER — Ambulatory Visit: Payer: Medicare Other

## 2020-10-30 ENCOUNTER — Other Ambulatory Visit: Payer: Self-pay | Admitting: Podiatry

## 2020-10-31 ENCOUNTER — Ambulatory Visit: Payer: Medicare Other

## 2020-10-31 ENCOUNTER — Other Ambulatory Visit: Payer: Self-pay

## 2020-10-31 NOTE — Progress Notes (Signed)
Erroneous Encounter Attempted to reach patient x3 and left voicemails with no return call

## 2020-10-31 NOTE — Telephone Encounter (Signed)
Please advise 

## 2020-11-04 ENCOUNTER — Ambulatory Visit: Payer: Medicare Other | Admitting: Pharmacist

## 2020-11-04 DIAGNOSIS — I10 Essential (primary) hypertension: Secondary | ICD-10-CM

## 2020-11-04 DIAGNOSIS — E1140 Type 2 diabetes mellitus with diabetic neuropathy, unspecified: Secondary | ICD-10-CM

## 2020-11-04 NOTE — Chronic Care Management (AMB) (Signed)
Chronic Care Management Pharmacy  Name: Cynthia Herrera  MRN: [id number] DOB: 12-Feb-1946  Chief Complaint/ HPI  Cynthia Herrera,  75 y.o. , female presents for their Follow-Up CCM visit with the clinical pharmacist via telephone due to COVID-19 Pandemic.   PCP : Cynthia Herrera, Cynthia Halsted, MD  Their chronic conditions include: HTN, HLD, chronic combined CHF, DM II, chronic Afib, hx of breast cancer s/p left mastectomy, arthritis, COPD, osteopenia  Office Visits: 09/12/20 Cynthia Frohlich, MD: Patient presented for chronic conditions follow up. Increased gabapentin to 600 mg at bedtime.   02/12/20 OV - DM II well controlled at A1c of 6.7%. HTN well controlled. Needs new lipid panel. Last LDL was 56 in 2019. Afib followed by cardiology - rate controlled and on Xarelto.  Consult Visit:  08/21/20 Cynthia Males, MD (pulmonary): Patient presented for SOB follow up. Referral placed for pulmonary rehab and PFTs.  08/15/20 Cynthia Lose, MD (hematology/oncology): Patient presented for follow up.  Medications: Outpatient Encounter Medications as of 11/04/2020  Medication Sig  . atorvastatin (LIPITOR) 40 MG tablet TAKE 1 TABLET BY MOUTH EVERYDAY AT BEDTIME  . Cholecalciferol (VITAMIN D3) 2000 units capsule Take 2,000 Units by mouth daily.  Marland Kitchen gabapentin (NEURONTIN) 300 MG capsule Take 2 capsules (600 mg total) by mouth at bedtime.  Alveda Reasons 20 MG TABS tablet TAKE 1 TABLET BY MOUTH EVERY DAY WITH SUPPER  . [DISCONTINUED] spironolactone (ALDACTONE) 25 MG tablet TAKE 1 TABLET BY MOUTH EVERY DAY  . amoxicillin (AMOXIL) 500 MG capsule Take 2,000 mg by mouth See admin instructions. Take 2000 mg by mouth 1 hour prior to dental appointment  . clotrimazole-betamethasone (LOTRISONE) cream Apply 1 application topically 2 (two) times daily.  . cyclobenzaprine (FLEXERIL) 5 MG tablet TAKE 1 TABLET BY MOUTH AT BEDTIME AS NEEDED FOR MUSCLE SPASMS  . fexofenadine (ALLEGRA) 180 MG tablet Take 180  mg by mouth daily as needed for allergies or rhinitis.  . fluconazole (DIFLUCAN) 150 MG tablet TAKE 1 TABLET BY MOUTH ONE TIME PER WEEK  . fluticasone (FLONASE) 50 MCG/ACT nasal spray Place 1 spray into both nostrils daily as needed for allergies.   Marland Kitchen glucose blood test strip Use to check CBG AC and QHS.  Marland Kitchen KLOR-CON M20 20 MEQ tablet TAKE 1 TABLET BY MOUTH EVERY DAY  . letrozole (FEMARA) 2.5 MG tablet Take 1 tablet (2.5 mg total) by mouth daily.  Marland Kitchen losartan (COZAAR) 25 MG tablet TAKE 1 TABLET BY MOUTH TWICE A DAY  . metFORMIN (GLUCOPHAGE) 500 MG tablet TAKE 1 TABLET BY MOUTH TWICE A DAY WITH MEALS  . nebivolol (BYSTOLIC) 2.5 MG tablet TAKE 1 TABLET BY MOUTH EVERY DAY  . traMADol (ULTRAM) 50 MG tablet Take 50 mg by mouth every 6 (six) hours as needed for moderate pain.  . [DISCONTINUED] furosemide (LASIX) 20 MG tablet TAKE 1 TABLET BY MOUTH TWICE A DAY (Patient taking differently: Take 20 mg by mouth daily.)  . [DISCONTINUED] pantoprazole (PROTONIX) 40 MG tablet TAKE 1 TABLET BY MOUTH EVERY DAY   No facility-administered encounter medications on file as of 11/04/2020.   Physical Activity: Inactive  . Days of Exercise per Week: 0 days  . Minutes of Exercise per Session: 0 min   Financial Resource Strain: Low Risk   . Difficulty of Paying Living Expenses: Not very hard   Patient reports her biggest concern right now is her neuropathy. She sees a doctor on [place] for this and admits that she has some sleepiness/drowsiness  from gabapentin. She has also both knees replaced and back surgery.   Current Diagnosis/Assessment:  Goals Addressed            This Visit's Progress   . Chronic Care Management       CARE PLAN ENTRY  Current Barriers:  . Chronic Disease Management support, education, and care coordination needs related to Hypertension, Hyperlipidemia, and Diabetes   Hypertension BP Readings from Last 3 Encounters:  09/12/20 110/78  08/21/20 116/64  08/15/20 136/79    . Pharmacist Clinical Goal(s): o Over the next 120 days, patient will work with PharmD and providers to maintain BP goal <140/90 . Current regimen:  . Losartan 25 mg 1 tablet twice daily . Spironolactone 25 mg 1 tablet daily . Furosemide 20 mg 1 tablet twice daily . Interventions: o Discussed low salt diet and exercising as tolerated extensively . Patient self care activities - Over the next 120 days, patient will: o Check BP once a week, document, and provide at future appointments o Ensure daily salt intake < 2300 mg/day  Hyperlipidemia Lab Results  Component Value Date/Time   LDLCALC 70 01/01/2020 11:43 AM   . Pharmacist Clinical Goal(s): o Over the next 120 days, patient will work with PharmD and providers to maintain LDL goal < 100 . Current regimen:  o Atorvastatin 40 mg 1 tablet daily at bedtime . Interventions: o Discussed lowering cholesterol through diet by: Marland Kitchen Limiting foods with cholesterol such as liver and other organ meats, egg yolks, shrimp, and whole milk dairy products . Avoiding saturated fats and trans fats and incorporating healthier fats, such as lean meat, nuts, and unsaturated oils like canola and olive oils . Eating foods with soluble fiber such as whole-grain cereals such as oatmeal and oat bran, fruits such as apples, bananas, oranges, pears, and prunes, legumes such as kidney beans, lentils, chick peas, black-eyed peas, and lima beans, and green leafy vegetables . Limiting alcohol intake . Patient self care activities - Over the next 120 days, patient will: o Continue with low cholesterol diet/weight management   Diabetes Lab Results  Component Value Date/Time   HGBA1C 6.7 (A) 02/12/2020 11:37 AM   HGBA1C 7.4 (A) 11/15/2019 02:00 PM   HGBA1C 6.2 (H) 01/02/2018 02:51 PM   HGBA1C 6.2 (H) 10/27/2017 08:06 AM   . Pharmacist Clinical Goal(s): o Over the next 120 days, patient will work with PharmD and providers to maintain A1c goal <7% . Current  regimen:  o Metformin 500 mg 1 tablet twice daily with a meal . Interventions o Discussed carbohydrate counting and exercising as tolerated extensively . Patient self care activities - Over the next 120 days, patient will: o Check blood sugar in the morning before eating or drinking, document, and provide at future appointments o Contact provider with any episodes of hypoglycemia  Medication management . Pharmacist Clinical Goal(s): o Over the next 120 days, patient will work with PharmD and providers to maintain optimal medication adherence . Current pharmacy: [facility] . Interventions o Comprehensive medication review performed. o Continue current medication management strategy . Patient self care activities - Over the next 120 days, patient will: o Focus on medication adherence by using weekly pill organizers o Take medications as prescribed o Report any questions or concerns to PharmD and/or provider(s)  Please see past updates related to this goal by clicking on the "Past Updates" button in the selected goal         AFIB   Patient is currently rate  controlled.  Patient has failed these meds in past: metoprolol, digoxin Patient is currently controlled on the following medications:   Bystolic 2.5 mg 1 tablet daily  Xarelto 20 mg 1 tablet daily with supper  We discussed: Monitoring for signs of bleeding such as unexplained and excessive bleeding from a cut or injury, easy or excessive bruising, blood in urine or stools, and nosebleeds without a known cause  Plan  Continue current medications   Heart Failure   Type: Combined Systolic and Diastolic  Last ejection fraction: 35-40% (05/2018) NYHA Class: II (slight limitation of activity) AHA HF Stage: C (Heart disease and symptoms present)  Patient has failed these meds in past: amiodarone, valsartan/hctz Patient is currently controlled on the following medications:   Spironolactone 25 mg 1 tablet  daily  Furosemide 20 mg 1 tablet AM and every other night  Klor-con M20 1 tablet daily  Losartan 25 mg 1 tablet twice daily   We discussed: importance of monitoring BP at home   Plan  Continue current medications    Hypertension  BP goal < 140/90  Office blood pressures are  BP Readings from Last 3 Encounters:  09/12/20 110/78  08/21/20 116/64  08/15/20 136/79   BP cuff does not have one at home - has $50 with insurance at the drug store  Patient has failed these meds in the past: metoprolol, valsartan/hctz  Patient is currently controlled on the following medications:  . Losartan 25 mg 1 tablet twice daily . Spironolactone 25 mg 1 tablet daily . Furosemide 20 mg 1 tablet twice daily  Patient checks BP at home infrequently  We discussed: taking Lasix earlier to avoid having to go to the bathroom at night and the importance of checking blood pressure at home while taking blood pressure medications   Plan Patient plans to purchase a BP cuff and will monitor BP at home. Continue current medications    Hyperlipidemia  LDL goal < 70  Lipid Panel     Component Value Date/Time   CHOL 140 01/01/2020 1143   TRIG 159 (H) 01/01/2020 1143   HDL 43 01/01/2020 1143   CHOLHDL 3.3 01/01/2020 1143   CHOLHDL 3 02/03/2017 1207   VLDL 23.0 02/03/2017 1207   [place] 70 01/01/2020 1143   LABVLDL 27 01/01/2020 1143     The 10-year ASCVD risk score Mikey Bussing DC Jr., et al., 2013) is: 27.7%   Values used to calculate the score:     Age: 59 years     Sex: Female     Is Non-Hispanic African American: No     Diabetic: Yes     Tobacco smoker: No     Systolic Blood Pressure: 947 mmHg     Is BP treated: Yes     HDL Cholesterol: 43 mg/dL     Total Cholesterol: 140 mg/dL   Patient has failed these meds in past: simvastatin Patient is currently controlled on the following medications:  . Atorvastatin 40 mg 1 tablet daily at bedtime  Patient tries to eat healthy, but reports it  could be better. Patient eats variety of food daily.   Plan  Continue current medications   Diabetes   Recent Relevant Labs: Lab Results  Component Value Date/Time   HGBA1C 6.7 (A) 02/12/2020 11:37 AM   HGBA1C 7.4 (A) 11/15/2019 02:00 PM   HGBA1C 6.2 (H) 01/02/2018 02:51 PM   HGBA1C 6.2 (H) 10/27/2017 08:06 AM   GFR 62.33 02/03/2017 12:07 PM   GFR 61.05 11/06/2015  09:59 AM   MICROALBUR 1.2 02/03/2017 12:07 PM   MICROALBUR 0.7 11/06/2015 09:59 AM     Checking BG: Rarely - when she remembers  Patient has failed these meds in past: glipizide, levemir   Patient is currently controlled on the following medications:  Marland Kitchen Metformin 500 mg 1 tablet twice daily with a meal  We discussed:  -Following the healthy plate method which includes: . Fill half of your plate with nonstarchy vegetables, such as spinach, broccoli, carrots and tomatoes. Venida Jarvis a quarter of your plate with a protein, such as tuna, lean pork or chicken. Venida Jarvis the last quarter with a whole-grain item, such as brown rice, or a starchy vegetable, such as green peas or potatoes. . Include "good" fats such as nuts or avocados in small amounts.   Last diabetic Eye exam:  Lab Results  Component Value Date/Time   HMDIABEYEEXA No Retinopathy 07/24/2020 12:00 AM    Last diabetic Foot exam: No results found for: HMDIABFOOTEX   Plan  Continue current medications   Chronic Pain   Patient has failed these meds in past: none Patient is currently controlled on the following medications:  . Gabapentin 300 mg 2 capsules at bedtime . Tramadol 50 mg 1 tablet every 6 hrs PRN for moderate pain   Plan  Continue current medications   GERD   Patient has failed these meds in past: None Patient is currently controlled on the following medications:  . Pantoprazole 40 mg 1 tablet daily  Patient well controlled and denies any acid reflux or heart burn for the past days.   Plan Reassess if a taper should be  considered. Continue current medications   Hx of Breat Cancer   Patient has failed these meds in past: None Patient is currently controlled on the following medications:  . Letrozole 2.5 mg 1 tablet daily  Patient reports to be stable. No complaints of side effects.   Plan  Continue current medications    Osteopenia    Vit D, 25-Hydroxy  Date Value Ref Range Status  12/10/2009 28 (L) 30 - 89 ng/mL Final    Comment:    This assay accurately quantifies Vitamin D, which is the sum of the25-Hydroxy forms of Vitamin D2 and D3.  Studies have shown that theoptimum concentration of 25-Hydroxy Vitamin D is 30 ng/mL or higher. Concentrations of Vitamin D between 20 and 29 ng/mL  are considered tobe insufficient and concentrations less than 20 ng/mL are consideredto be deficient for Vitamin D.    Patient has failed these meds in past: None Patient is currently controlled on the following medications:  Marland Kitchen Vitamin D3 2000 units 1 tablet daily  We discussed: recommendations for 1200 mg of calcium daily between diet and supplemental sources.  -Patient eats some cheese, not many almonds, no seeds, barely any milk, does not like cottage cheese or sour cream  Plan Recommended supplementation with calcium.  Recommend repeat vitamin D level and DEXA. Continue current medications    OTCs/Health Maintenance   Patient is currently controlled on the following medications: . Amoxil 500 mg 4 tablets by mouth prior to dental procedure . Clotrimazole/betamethasone cream apply twice daily . Allegra 180 mg 1 tablet daily PRN for allergies/rhinitis . Fluconazole 150 mg 1 tablet once a week . Fluticasone 50 mcg/act 1 spray into both nostril PRN for allergies  Patient reports allergies to be seasonal   Plan  Continue current medications   Vaccines   Reviewed and discussed patient's vaccination  history.    Immunization History  Administered Date(s) Administered  . Fluad Quad(high Dose  65+) 07/18/2019  . Influenza Split 09/29/2011, 07/20/2012  . Influenza Whole 08/14/2008  . Influenza, High Dose Seasonal PF 09/09/2017, 07/17/2020  . Influenza,inj,Quad PF,6+ Mos 10/23/2013, 08/20/2014, 08/14/2015, 08/27/2016, 08/09/2018  . PFIZER(Purple Top)SARS-COV-2 Vaccination 12/15/2019, 01/08/2020  . Pneumococcal Conjugate-13 10/23/2013  . Pneumococcal Polysaccharide-23 06/18/2011  . Tdap 06/18/2011  . Zoster 07/20/2012   Patient reports getting COVID booster.   Plan  Recommended patient receive Shingrix vaccine at pharmacy.  Patient declines getting Shingrix as she already got Zostavax.  Medication Management   Pharmacy/Benefits: [facility] / [employer] Adherence:  Pt endorses 100% compliance  Patient is adherent with her medications. Patient has a list of all of her medications and she reports taking them as directed.   Plan  Continue current medication management strategy   Follow up: 4-5 month phone visit 2 month BP assessment    Cynthia Herrera, PharmD BCACP Clinical Pharmacist [place] [facility] [phone] grand daughter is 65 years old Grandchildren are grown - have kids When kids are out of the house - they are out [employer]org[employer] she goes with out to eat often

## 2020-11-12 ENCOUNTER — Telehealth (HOSPITAL_COMMUNITY): Payer: Self-pay | Admitting: *Deleted

## 2020-11-13 ENCOUNTER — Other Ambulatory Visit: Payer: Self-pay | Admitting: Internal Medicine

## 2020-11-13 ENCOUNTER — Other Ambulatory Visit: Payer: Self-pay | Admitting: Cardiology

## 2020-11-19 NOTE — Patient Instructions (Addendum)
Hi Hazell,  It was lovely to get to meet you over the phone! I have attached some information about supporting your bone health and getting enough calcium and vitamin D each day to do so that I hope will be helpful. I also think it would be beneficial for you to check your blood pressure at home regularly to make sure your medications are working properly and that you aren't having any low blood pressure readings.   Please give me a call if you have any questions or need anything from me!  Best, Maddie  [name], PharmD [facility] Clinical Pharmacist [place] [facility] [place]   Visit Information  Goals Addressed   None     Patient verbalizes understanding of instructions provided today and agrees to view in [place].   Telephone follow up appointment with pharmacy team member scheduled for: 4 months  Viona Gilmore, [place] protect organs, store calcium, anchor muscles, and support the whole body. Keeping your bones strong is important, especially as you get older. You can take actions to help keep your bones strong and healthy. Why is keeping my bones healthy important? Keeping your bones healthy is important because your body constantly replaces bone cells. Cells get old, and new cells take their place. As we age, we lose bone cells because the body may not be able to make enough new cells to replace the old cells. The amount of bone cells and bone tissue you have is referred to as bone mass. The higher your bone mass, the stronger your bones. The aging process leads to an overall loss of bone mass in the body, which can increase the likelihood of:  Joint pain and stiffness.  Broken bones.  A condition in which the bones become weak and brittle (osteoporosis). A large decline in bone mass occurs in older adults. In women, it occurs about the time of menopause.   What actions can I take to keep my bones healthy? Good health habits are  important for maintaining healthy bones. This includes eating nutritious foods and exercising regularly. To have healthy bones, you need to get enough of the right minerals and vitamins. Most nutrition experts recommend getting these nutrients from the foods that you eat. In some cases, taking supplements may also be recommended. Doing certain types of exercise is also important for bone health. What are the nutritional recommendations for healthy bones? Eating a well-balanced diet with plenty of calcium and vitamin D will help to protect your bones. Nutritional recommendations vary from person to person. Ask your health care provider what is healthy for you. Here are some general guidelines. Get enough calcium Calcium is the most important (essential) mineral for bone health. Most people can get enough calcium from their diet, but supplements may be recommended for people who are at risk for osteoporosis. Good sources of calcium include:  Dairy products, such as low-fat or nonfat milk, cheese, and yogurt.  Dark green leafy vegetables, such as bok choy and broccoli.  Calcium-fortified foods, such as orange juice, cereal, bread, soy beverages, and tofu products.  Nuts, such as almonds. Follow these recommended amounts for daily calcium intake:  Children, age 984-3: 700 mg.  Children, age 98-8: 1,000 mg.  Children, age 3-13: 1,300 mg.  Teens, age 34-18: 1,300 mg.  Adults, age 64-50: 1,000 mg.  Adults, age 76-70: ? Men: 1,000 mg. ? Women: 1,200 mg.  Adults, age 70 or older: 1,200 mg.  Pregnant and breastfeeding females: ?  Teens: 1,300 mg. ? Adults: 1,000 mg. Get enough vitamin D Vitamin D is the most essential vitamin for bone health. It helps the body absorb calcium. Sunlight stimulates the skin to make vitamin D, so be sure to get enough sunlight. If you live in a cold climate or you do not get outside often, your health care provider may recommend that you take vitamin D supplements.  Good sources of vitamin D in your diet include:  Egg yolks.  Saltwater fish.  Milk and cereal fortified with vitamin D. Follow these recommended amounts for daily vitamin D intake:  Children and teens, age 53-18: 600 international units.  Adults, age 76 or younger: 400-800 international units.  Adults, age 86 or older: 800-1,000 international units. Get other important nutrients Other nutrients that are important for bone health include:  Phosphorus. This mineral is found in meat, poultry, dairy foods, nuts, and legumes. The recommended daily intake for adult men and adult women is 700 mg.  Magnesium. This mineral is found in seeds, nuts, dark green vegetables, and legumes. The recommended daily intake for adult men is 400-420 mg. For adult women, it is 310-320 mg.  Vitamin K. This vitamin is found in green leafy vegetables. The recommended daily intake is 120 mg for adult men and 90 mg for adult women.   What type of physical activity is best for building and maintaining healthy bones? Weight-bearing and strength-building activities are important for building and maintaining healthy bones. Weight-bearing activities cause muscles and bones to work against gravity. Strength-building activities increase the strength of the muscles that support bones. Weight-bearing and muscle-building activities include:  Walking and hiking.  Jogging and running.  Dancing.  Gym exercises.  Lifting weights.  Tennis and racquetball.  Climbing stairs.  Aerobics. Adults should get at least 30 minutes of moderate physical activity on most days. Children should get at least 60 minutes of moderate physical activity on most days. Ask your health care provider what type of exercise is best for you.   How can I find out if my bone mass is low? Bone mass can be measured with an X-ray test called a bone mineral density (BMD) test. This test is recommended for all women who are age 35 or older. It may also  be recommended for:  Men who are age 65 or older.  People who are at risk for osteoporosis because of: ? Having bones that break easily. ? Having a long-term disease that weakens bones, such as kidney disease or rheumatoid arthritis. ? Having menopause earlier than normal. ? Taking medicine that weakens bones, such as steroids, thyroid hormones, or hormone treatment for breast cancer or prostate cancer. ? Smoking. ? Drinking three or more alcoholic drinks a day. If you find that you have a low bone mass, you may be able to prevent osteoporosis or further bone loss by changing your diet and lifestyle. Where can I find more information? For more information, check out the following websites:  [place]: AviationTales.fr  [employer] of Health: www.bones.SouthExposed.es  International Osteoporosis Foundation: Administrator.iofbonehealth.org Summary  The aging process leads to an overall loss of bone mass in the body, which can increase the likelihood of broken bones and osteoporosis.  Eating a well-balanced diet with plenty of calcium and vitamin D will help to protect your bones.  Weight-bearing and strength-building activities are also important for building and maintaining strong bones.  Bone mass can be measured with an X-ray test called a bone mineral density (  BMD) test. This information is not intended to replace advice given to you by your health care provider. Make sure you discuss any questions you have with your health care provider. Document Revised: 11/07/2017 Document Reviewed: 11/07/2017 Elsevier Patient Education  2021 [employer].

## 2020-12-04 ENCOUNTER — Telehealth (HOSPITAL_COMMUNITY): Payer: Self-pay | Admitting: *Deleted

## 2020-12-08 ENCOUNTER — Other Ambulatory Visit: Payer: Self-pay

## 2020-12-08 ENCOUNTER — Encounter (HOSPITAL_COMMUNITY)
Admission: RE | Admit: 2020-12-08 | Discharge: 2020-12-08 | Disposition: A | Discharge status: Home / Self Care | Payer: Medicare Other | Source: Ambulatory Visit | Attending: Internal Medicine | Admitting: Internal Medicine

## 2020-12-08 VITALS — BP 110/56 | HR 69 | Ht 66.0 in | Wt 236.1 lb

## 2020-12-08 DIAGNOSIS — I5022 Chronic systolic (congestive) heart failure: Secondary | ICD-10-CM | POA: Insufficient documentation

## 2020-12-08 NOTE — Progress Notes (Signed)
Cynthia Herrera 75 y.o. female Pulmonary Rehab Orientation Note This patient who was referred to Pulmonary rehab by Dr. Chase Caller with the diagnosis of chronic systolic heart failure arrived today in Cardiac and Pulmonary Rehab. She arrived ambulatory with a limp in her gait. She does not carry portable oxygen. Per pt, she uses oxygen never. Color good, skin warm and dry. Patient is oriented to time and place. Patient's medical history, psychosocial health, and medications reviewed. Psychosocial assessment reveals pt lives alone. Pt is currently retired. Pt hobbies include having meals with her friends, taking care of her dog, and playing games on her computer pad. Pt reports her stress level is low. Pt does not exhibit signs of depression. PHQ2/9 score 0/0. Pt shows good  coping skills with positive outlook . Will continue to monitor and evaluate progress toward psychosocial goal(s) of continued mental well being while participating in pulmonary rehab. Physical assessment reveals heart rate is normal, breath sounds clear to auscultation, no wheezes, rales, or rhonchi. Grip strength equal, strong. Distal pulses 3+ bilateral posterior tibial pulses present without peripheral edema. Patient reports she does take medications as prescribed. Patient states she follows a Low Sodium, Diabetic diet. The patient reports no specific efforts to gain or lose weight.. Patient's weight will be monitored closely. Demonstration and practice of PLB using pulse oximeter. Patient able to return demonstration satisfactorily. Safety and hand hygiene in the exercise area reviewed with patient. Patient voices understanding of the information reviewed. Department expectations discussed with patient and achievable goals were set. The patient shows enthusiasm about attending the program and we look forward to working with this nice lady. The patient completed a 6 min walk test today, 12/08/2020 and to begin exercise on 12/16/2020 in the  1045 exercise slot.  1015-1300

## 2020-12-08 NOTE — Progress Notes (Signed)
Pulmonary Individual Treatment Plan  Patient Details  Name: Cynthia Herrera MRN: [id number] Date of Birth: 1945-11-19 Referring Provider:    Initial Encounter Date:   Visit Diagnosis: Heart failure, chronic systolic ([place])  Patient's Home Medications on Admission:   Current Outpatient Medications:  .  amoxicillin (AMOXIL) 500 MG capsule, Take 2,000 mg by mouth See admin instructions. Take 2000 mg by mouth 1 hour prior to dental appointment, Disp: , Rfl: 5 .  atorvastatin (LIPITOR) 40 MG tablet, TAKE 1 TABLET BY MOUTH EVERYDAY AT BEDTIME, Disp: 90 tablet, Rfl: 2 .  Cholecalciferol (VITAMIN D3) 2000 units capsule, Take 2,000 Units by mouth daily., Disp: , Rfl:  .  clotrimazole-betamethasone (LOTRISONE) cream, Apply 1 application topically 2 (two) times daily., Disp: 30 g, Rfl: 0 .  fexofenadine (ALLEGRA) 180 MG tablet, Take 180 mg by mouth daily as needed for allergies or rhinitis., Disp: , Rfl:  .  fluconazole (DIFLUCAN) 150 MG tablet, TAKE 1 TABLET BY MOUTH ONE TIME PER WEEK, Disp: 6 tablet, Rfl: 2 .  fluticasone (FLONASE) 50 MCG/ACT nasal spray, Place 1 spray into both nostrils daily as needed for allergies. , Disp: , Rfl:  .  furosemide (LASIX) 20 MG tablet, TAKE 1 TABLET BY MOUTH TWICE A DAY, Disp: 180 tablet, Rfl: 1 .  gabapentin (NEURONTIN) 300 MG capsule, Take 2 capsules (600 mg total) by mouth at bedtime., Disp: 180 capsule, Rfl: 1 .  glucose blood test strip, Use to check CBG AC and QHS., Disp: 200 each, Rfl: 12 .  KLOR-CON M20 20 MEQ tablet, TAKE 1 TABLET BY MOUTH EVERY DAY, Disp: 90 tablet, Rfl: 1 .  letrozole (FEMARA) 2.5 MG tablet, Take 1 tablet (2.5 mg total) by mouth daily., Disp: 90 tablet, Rfl: 3 .  losartan (COZAAR) 25 MG tablet, TAKE 1 TABLET BY MOUTH TWICE A DAY, Disp: 180 tablet, Rfl: 3 .  metFORMIN (GLUCOPHAGE) 500 MG tablet, TAKE 1 TABLET BY MOUTH TWICE A DAY WITH MEALS, Disp: 180 tablet, Rfl: 0 .  nebivolol (BYSTOLIC) 2.5 MG tablet, TAKE 1 TABLET BY MOUTH EVERY DAY,  Disp: 90 tablet, Rfl: 1 .  pantoprazole (PROTONIX) 40 MG tablet, Take 1 tablet (40 mg total) by mouth daily. Please call and schedule an appointment with Dr Rayann Heman for further refills, Disp: 90 tablet, Rfl: 0 .  spironolactone (ALDACTONE) 25 MG tablet, TAKE 1 TABLET BY MOUTH EVERY DAY, Disp: 90 tablet, Rfl: 1 .  traMADol (ULTRAM) 50 MG tablet, Take 50 mg by mouth every 6 (six) hours as needed for moderate pain., Disp: , Rfl:  .  XARELTO 20 MG TABS tablet, TAKE 1 TABLET BY MOUTH EVERY DAY WITH SUPPER, Disp: 30 tablet, Rfl: 10 .  cyclobenzaprine (FLEXERIL) 5 MG tablet, TAKE 1 TABLET BY MOUTH AT BEDTIME AS NEEDED FOR MUSCLE SPASMS (Patient not taking: Reported on 12/08/2020), Disp: 20 tablet, Rfl: 0  Past Medical History: Past Medical History:  Diagnosis Date  . Arthritis   . Breast cancer, left breast ([place]) 2002   "then treated w/chemo and radiation"  . Breast cancer, left breast ([place]) 07/14/2017   "tx'd w/mastectomy"  . Chronic back pain    h/o lumbar stenosis; "no problem since my OR" (`10/27/2017)  . Chronic systolic CHF (congestive heart failure) ([place])   . Complication of anesthesia    last surgery iv med when going to sleep"burned as injected"  . COPD (chronic obstructive pulmonary disease) ([facility])    no inhalers   . DCM (dilated cardiomyopathy) ([place])  EF 15-20% ? tachycardia induced - EF 35-40% by echo 05/2018  . Dyspnea    with exertion  . Gallstones   . GERD (gastroesophageal reflux disease)    once in a while;depends on what she eats  . History of bronchitis    "chronic when I smoked; no problem since I quit in 2002" (10/27/2017)  . Hyperlipidemia   . Hypertension    takes Losartan and Metoprolol.   . Joint pain   . Muscle spasm    takes Flexeril daily as needed   . Nocturia   . OSA (obstructive sleep apnea) 08/22/2018   Severe obstructive sleep apnea with an AHI 47.3/h and no significant central sleep apnea with moderate oxygen desaturations as low as 79%  . Osteopenia   .  Persistent atrial fibrillation ([place])    s/p TEE DCCV and repeat DCCV 11/14/2013 and 02/2018  . Personal history of colonic polyps    adenomas 03 and 08  . Pneumonia    hx  . Seasonal allergies   . Thyroid nodule   . Type II diabetes mellitus ([place])   . Vitamin D deficiency    takes Vit D    Tobacco Use: Social History   Tobacco Use  Smoking Status Former Smoker  . Packs/day: 1.50  . Years: 35.00  . Pack years: 52.50  . Types: Cigarettes  . Quit date: 08/24/2001  . Years since quitting: 19.3  Smokeless Tobacco Never Used    Labs: Recent Review Flowsheet Data    Labs for ITP Cardiac and Pulmonary Rehab Latest Ref Rng & Units 01/02/2019 07/18/2019 11/15/2019 01/01/2020 02/12/2020   Cholestrol 100 - 199 mg/dL - - - 140 -   LDLCALC 0 - 99 mg/dL - - - 70 -   HDL >39 mg/dL - - - 43 -   Trlycerides 0 - 149 mg/dL - - - 159(H) -   Hemoglobin A1c 4.0 - 5.6 % 5.4 6.3(A) 7.4(A) - 6.7(A)   PHART 7.350 - 7.450 - - - - -   PCO2ART 35.0 - 45.0 mmHg - - - - -   HCO3 20.0 - 24.0 mEq/L - - - - -   TCO2 0 - 100 mmol/L - - - - -   ACIDBASEDEF 0.0 - 2.0 mmol/L - - - - -   O2SAT % - - - - -      Capillary Blood Glucose: Lab Results  Component Value Date   GLUCAP 139 (H) 12/12/2018   GLUCAP 137 (H) 12/12/2018   GLUCAP 117 (H) 03/24/2018   GLUCAP 91 03/22/2018   GLUCAP 125 (H) 03/22/2018     Pulmonary Assessment Scores:  Pulmonary Assessment Scores    Row Name 12/08/20 1130 12/08/20 1227       ADL UCSD   ADL Phase -- Entry    SOB Score total -- 49         CAT Score   CAT Score -- 16         mMRC Score   mMRC Score 3 3          UCSD: Self-administered rating of dyspnea associated with activities of daily living (ADLs) 6-point scale (0 = "not at all" to 5 = "maximal or unable to do because of breathlessness")  Scoring Scores range from 0 to 120.  Minimally important difference is 5 units  CAT: CAT can identify the health impairment of COPD patients and is better correlated  with disease progression.  CAT has a scoring  range of zero to 40. The CAT score is classified into four groups of low (less than 10), medium (10 - 20), high (21-30) and very high (31-40) based on the impact level of disease on health status. A CAT score over 10 suggests significant symptoms.  A worsening CAT score could be explained by an exacerbation, poor medication adherence, poor inhaler technique, or progression of COPD or comorbid conditions.  CAT MCID is 2 points  mMRC: mMRC (Modified Medical Research Council) Dyspnea Scale is used to assess the degree of baseline functional disability in patients of respiratory disease due to dyspnea. No minimal important difference is established. A decrease in score of 1 point or greater is considered a positive change.   Pulmonary Function Assessment:  Pulmonary Function Assessment - 12/08/20 1112      Breath   Bilateral Breath Sounds Clear    Shortness of Breath Yes;Limiting activity           Exercise Target Goals: Exercise Program Goal: Individual exercise prescription set using results from initial 6 min walk test and THRR while considering  patient's activity barriers and safety.   Exercise Prescription Goal: Initial exercise prescription builds to 30-45 minutes a day of aerobic activity, 2-3 days per week.  Home exercise guidelines will be given to patient during program as part of exercise prescription that the participant will acknowledge.  Activity Barriers & Risk Stratification:  Activity Barriers & Cardiac Risk Stratification - 12/08/20 1109      Activity Barriers & Cardiac Risk Stratification   Activity Barriers Arthritis;Back Problems;Left Knee Replacement;Right Knee Replacement;Joint Problems;Deconditioning;Muscular Weakness           6 Minute Walk:  6 Minute Walk    Row Name 12/08/20 1228         6 Minute Walk   Phase Initial     Distance 869 feet     Walk Time 6 minutes     # of Rest Breaks 1     MPH 1.64      METS 1.25     RPE 12     Perceived Dyspnea  2     VO2 Peak 4.38     Symptoms Yes (comment)     Comments Back pain and shortness of breath caused her to rest x 1, she has a limp and pushing a wheelchair steadied her     Resting HR 68 bpm     Resting BP 102/60     Resting Oxygen Saturation  95 %     Exercise Oxygen Saturation  during 6 min walk 88 %     Max Ex. HR 94 bpm     Max Ex. BP 126/70     2 Minute Post BP 110/62           Interval HR   1 Minute HR 72     2 Minute HR 93     3 Minute HR 87     4 Minute HR 93     5 Minute HR 94     2 Minute Post HR 65     Interval Heart Rate? Yes           Interval Oxygen   Interval Oxygen? Yes     Baseline Oxygen Saturation % 95 %     1 Minute Oxygen Saturation % 88 %     1 Minute Liters of Oxygen 0 L     2 Minute Oxygen Saturation % 95 %  2 Minute Liters of Oxygen 0 L     3 Minute Oxygen Saturation % 93 %     3 Minute Liters of Oxygen 0 L     4 Minute Oxygen Saturation % 98 %     4 Minute Liters of Oxygen 0 L     5 Minute Oxygen Saturation % 96 %     5 Minute Liters of Oxygen 0 L     6 Minute Liters of Oxygen 0 L     2 Minute Post Oxygen Saturation % 98 %     2 Minute Post Liters of Oxygen 0 L            Oxygen Initial Assessment:  Oxygen Initial Assessment - 12/08/20 1112      Home Oxygen   Home Oxygen Device None    Sleep Oxygen Prescription None    Home Exercise Oxygen Prescription None    Home Resting Oxygen Prescription None           Oxygen Re-Evaluation:   Oxygen Discharge (Final Oxygen Re-Evaluation):   Initial Exercise Prescription:   Perform Capillary Blood Glucose checks as needed.  Exercise Prescription Changes:   Exercise Comments:   Exercise Goals and Review:   Exercise Goals Re-Evaluation :   Discharge Exercise Prescription (Final Exercise Prescription Changes):   Nutrition:  Target Goals: Understanding of nutrition guidelines, daily intake of sodium 1500mg , cholesterol  200mg , calories 30% from fat and 7% or less from saturated fats, daily to have 5 or more servings of fruits and vegetables.  Biometrics:  Pre Biometrics - 12/08/20 1110      Pre Biometrics   Height 5\' 6"  (1.676 m)    Weight 107.1 kg    BMI (Calculated) 38.13    Grip Strength 18 kg            Nutrition Therapy Plan and Nutrition Goals:   Nutrition Assessments:  MEDIFICTS Score Key:  ?70 Need to make dietary changes   40-70 Heart Healthy Diet  ? 40 Therapeutic Level Cholesterol Diet   Picture Your Plate Scores:  <40 Unhealthy dietary pattern with much room for improvement.  41-50 Dietary pattern unlikely to meet recommendations for good health and room for improvement.  51-60 More healthful dietary pattern, with some room for improvement.   >60 Healthy dietary pattern, although there may be some specific behaviors that could be improved.    Nutrition Goals Re-Evaluation:   Nutrition Goals Discharge (Final Nutrition Goals Re-Evaluation):   Psychosocial: Target Goals: Acknowledge presence or absence of significant depression and/or stress, maximize coping skills, provide positive support system. Participant is able to verbalize types and ability to use techniques and skills needed for reducing stress and depression.  Initial Review & Psychosocial Screening:  Initial Psych Review & Screening - 12/08/20 1121      Initial Review   Current issues with None Identified      Family Dynamics   Good Support System? Yes   son and daughter   Comments No concerns identified      Barriers   Psychosocial barriers to participate in program There are no identifiable barriers or psychosocial needs.      Screening Interventions   Interventions Encouraged to exercise           Quality of Life Scores:  Scores of 19 and below usually indicate a poorer quality of life in these areas.  A difference of  2-3 points is a clinically meaningful difference.  A difference  of  2-3 points in the total score of the Quality of Life Index has been associated with significant improvement in overall quality of life, self-image, physical symptoms, and general health in studies assessing change in quality of life.  PHQ-9: Recent Review Flowsheet Data    Depression screen [place] 2/9 12/08/2020 02/12/2020 01/02/2019 05/22/2018   Decreased Interest 0 0 0 0   Down, Depressed, Hopeless 0 0 0 0   PHQ - 2 Score 0 0 0 0   Altered sleeping 0 0 - -   Tired, decreased energy 0 1 - -   Change in appetite 0 0 - -   Feeling bad or failure about yourself  0 0 - -   Trouble concentrating 0 0 - -   Moving slowly or fidgety/restless 0 0 - -   Suicidal thoughts 0 0 - -   PHQ-9 Score - 1 - -   Difficult doing work/chores Not difficult at all Not difficult at all - -     Interpretation of Total Score  Total Score Depression Severity:  1-4 = Minimal depression, 5-9 = Mild depression, 10-14 = Moderate depression, 15-19 = Moderately severe depression, 20-27 = Severe depression   Psychosocial Evaluation and Intervention:  Psychosocial Evaluation - 12/08/20 1123      Psychosocial Evaluation & Interventions   Interventions Encouraged to exercise with the program and follow exercise prescription    Continue Psychosocial Services  No Follow up required           Psychosocial Re-Evaluation:   Psychosocial Discharge (Final Psychosocial Re-Evaluation):   Education: Education Goals: Education classes will be provided on a weekly basis, covering required topics. Participant will state understanding/return demonstration of topics presented.  Learning Barriers/Preferences:  Learning Barriers/Preferences - 12/08/20 1124      Learning Barriers/Preferences   Learning Barriers None    Learning Preferences Written Material           Education Topics: Risk Factor Reduction:  -Group instruction that is supported by a PowerPoint presentation. Instructor discusses the definition of a risk  factor, different risk factors for pulmonary disease, and how the heart and lungs work together.     Nutrition for Pulmonary Patient:  -Group instruction provided by PowerPoint slides, verbal discussion, and written materials to support subject matter. The instructor gives an explanation and review of healthy diet recommendations, which includes a discussion on weight management, recommendations for fruit and vegetable consumption, as well as protein, fluid, caffeine, fiber, sodium, sugar, and alcohol. Tips for eating when patients are short of breath are discussed.   Pursed Lip Breathing:  -Group instruction that is supported by demonstration and informational handouts. Instructor discusses the benefits of pursed lip and diaphragmatic breathing and detailed demonstration on how to preform both.     Oxygen Safety:  -Group instruction provided by PowerPoint, verbal discussion, and written material to support subject matter. There is an overview of "What is Oxygen" and "Why do we need it".  Instructor also reviews how to create a safe environment for oxygen use, the importance of using oxygen as prescribed, and the risks of noncompliance. There is a brief discussion on traveling with oxygen and resources the patient may utilize.   Oxygen Equipment:  -Group instruction provided by [facility] Staff utilizing handouts, written materials, and equipment demonstrations.   Signs and Symptoms:  -Group instruction provided by written material and verbal discussion to support subject matter. Warning signs and symptoms of infection, stroke, and heart attack are reviewed  and when to call the physician/911 reinforced. Tips for preventing the spread of infection discussed.   Advanced Directives:  -Group instruction provided by verbal instruction and written material to support subject matter. Instructor reviews Advanced Directive laws and proper instruction for filling out document.   Pulmonary Video:   -Group video education that reviews the importance of medication and oxygen compliance, exercise, good nutrition, pulmonary hygiene, and pursed lip and diaphragmatic breathing for the pulmonary patient.   Exercise for the Pulmonary Patient:  -Group instruction that is supported by a PowerPoint presentation. Instructor discusses benefits of exercise, core components of exercise, frequency, duration, and intensity of an exercise routine, importance of utilizing pulse oximetry during exercise, safety while exercising, and options of places to exercise outside of rehab.     Pulmonary Medications:  -Verbally interactive group education provided by instructor with focus on inhaled medications and proper administration.   Anatomy and Physiology of the Respiratory System and Intimacy:  -Group instruction provided by PowerPoint, verbal discussion, and written material to support subject matter. Instructor reviews respiratory cycle and anatomical components of the respiratory system and their functions. Instructor also reviews differences in obstructive and restrictive respiratory diseases with examples of each. Intimacy, Sex, and Sexuality differences are reviewed with a discussion on how relationships can change when diagnosed with pulmonary disease. Common sexual concerns are reviewed.   MD DAY -A group question and answer session with a medical doctor that allows participants to ask questions that relate to their pulmonary disease state.   OTHER EDUCATION -Group or individual verbal, written, or video instructions that support the educational goals of the pulmonary rehab program.   Holiday Eating Survival Tips:  -Group instruction provided by PowerPoint slides, verbal discussion, and written materials to support subject matter. The instructor gives patients tips, tricks, and techniques to help them not only survive but enjoy the holidays despite the onslaught of food that accompanies the  holidays.   Knowledge Questionnaire Score:  Knowledge Questionnaire Score - 12/08/20 1227      Knowledge Questionnaire Score   Pre Score 10/18           Core Components/Risk Factors/Patient Goals at Admission:  Personal Goals and Risk Factors at Admission - 12/08/20 1124      Core Components/Risk Factors/Patient Goals on Admission   Improve shortness of breath with ADL's Yes    Intervention Provide education, individualized exercise plan and daily activity instruction to help decrease symptoms of SOB with activities of daily living.    Expected Outcomes Short Term: Improve cardiorespiratory fitness to achieve a reduction of symptoms when performing ADLs;Long Term: Be able to perform more ADLs without symptoms or delay the onset of symptoms           Core Components/Risk Factors/Patient Goals Review:    Core Components/Risk Factors/Patient Goals at Discharge (Final Review):    ITP Comments:   Comments:

## 2020-12-16 ENCOUNTER — Other Ambulatory Visit: Payer: Self-pay

## 2020-12-16 ENCOUNTER — Encounter (HOSPITAL_COMMUNITY)
Admission: RE | Admit: 2020-12-16 | Discharge: 2020-12-16 | Disposition: A | Discharge status: Home / Self Care | Payer: Medicare Other | Source: Ambulatory Visit | Attending: Internal Medicine | Admitting: Internal Medicine

## 2020-12-16 ENCOUNTER — Ambulatory Visit: Payer: Medicare Other | Admitting: Internal Medicine

## 2020-12-16 VITALS — Wt 238.1 lb

## 2020-12-16 DIAGNOSIS — I5022 Chronic systolic (congestive) heart failure: Secondary | ICD-10-CM

## 2020-12-16 NOTE — Progress Notes (Signed)
Daily Session Note  Patient Details  Name: Cynthia Herrera MRN: [id number] Date of Birth: Aug 28, 1946 Referring Provider:   [name] Pulmonary Rehab Walk Test from 12/08/2020 in [place]  Referring Provider Dr. Chase Caller      Encounter Date: 12/16/2020  Check In:  Session Check In - 12/16/20 1110      Check-In   Supervising physician immediately available to respond to emergencies [facility] immediately available    Physician(s) Dr. Sloan Leiter    Location [facility]    Staff Present Rosebud Poles, RN, BSN;Lisa Ysidro Evert, RN;Jessica Hassell Done, MS, ACSM-CEP, Exercise Physiologist    Virtual Visit No    Medication changes reported     No    Fall or balance concerns reported    No    Tobacco Cessation No Change    Warm-up and Cool-down Performed on first and last piece of equipment    Resistance Training Performed Yes    VAD Patient? No    PAD/SET Patient? No      Pain Assessment   Currently in Pain? No/denies    Multiple Pain Sites No           Capillary Blood Glucose: No results found for this or any previous visit (from the past 24 hour(s)).  POCT Glucose - 12/16/20 1159      POCT Blood Glucose   Pre-Exercise 225 mg/dL    Post-Exercise 144 mg/dL           Exercise Prescription Changes - 12/16/20 1200      Response to Exercise   Blood Pressure (Admit) 122/60    Blood Pressure (Exercise) 134/66    Blood Pressure (Exit) 103/66    Heart Rate (Admit) 68 bpm    Heart Rate (Exercise) 95 bpm    Heart Rate (Exit) 73 bpm    Oxygen Saturation (Admit) 97 %    Oxygen Saturation (Exercise) 94 %    Oxygen Saturation (Exit) 99 %    Rating of Perceived Exertion (Exercise) 13    Perceived Dyspnea (Exercise) 3    Duration Continue with 30 min of aerobic exercise without signs/symptoms of physical distress.    Intensity --   40-80% HRR     Resistance Training   Training Prescription Yes    Weight orange bands    Reps  10-15    Time 10 Minutes      NuStep   Level 1    SPM 80    Minutes 15    METs 1.6      Track   Laps 9    Minutes 15           Social History   Tobacco Use  Smoking Status Former Smoker  . Packs/day: 1.50  . Years: 35.00  . Pack years: 52.50  . Types: Cigarettes  . Quit date: 08/24/2001  . Years since quitting: 19.3  Smokeless Tobacco Never Used    Goals Met:  Proper associated with RPD/PD & O2 Sat Exercise tolerated well Strength training completed today  Goals Unmet:  Not Applicable  Comments: Service time is from 1025 to 1200    Dr. [name] is Medical Director for Cardiac Rehab at [facility].

## 2020-12-18 ENCOUNTER — Other Ambulatory Visit: Payer: Self-pay

## 2020-12-18 ENCOUNTER — Encounter (HOSPITAL_COMMUNITY)
Admission: RE | Admit: 2020-12-18 | Discharge: 2020-12-18 | Disposition: A | Discharge status: Home / Self Care | Payer: Medicare Other | Source: Ambulatory Visit | Attending: Internal Medicine | Admitting: Internal Medicine

## 2020-12-18 DIAGNOSIS — I5022 Chronic systolic (congestive) heart failure: Secondary | ICD-10-CM

## 2020-12-18 NOTE — Progress Notes (Signed)
Daily Session Note  Patient Details  Name: Cynthia Herrera MRN: [id number] Date of Birth: [birth year] Referring Provider:   [name] Pul[facility] Rehab Walk Test from 12/08/2020 in [place]  Referring Provider Dr. Chase Caller      Encounter Date: 12/18/2020  Check In:  Session Check In - 12/18/20 1036      Check-In   Supervising physician immediately available to respond to emergencies [facility] immediately available    Physician(s) Dr. Frederic Jericho    Location [facility]    Staff Present Rosebud Poles, RN, BSN;Lisa Ysidro Evert, RN;Viktoria Gruetzmacher Hassell Done, MS, ACSM-CEP, Exercise Physiologist    Virtual Visit No    Medication changes reported     No    Fall or balance concerns reported    No    Tobacco Cessation No Change    Warm-up and Cool-down Performed on first and last piece of equipment    Resistance Training Performed Yes    VAD Patient? No    PAD/SET Patient? No      Pain Assessment   Currently in Pain? No/denies    Multiple Pain Sites No           Capillary Blood Glucose: No results found for this or any previous visit (from the past 24 hour(s)).    Social History   Tobacco Use  Smoking Status Former Smoker  . Packs/day: 1.50  . Years: 35.00  . Pack years: 52.50  . Types: Cigarettes  . Quit date: 08/24/2001  . Years since quitting: 19.3  Smokeless Tobacco Never Used    Goals Met:  Proper associated with RPD/PD & O2 Sat Exercise tolerated well No report of cardiac concerns or symptoms Strength training completed today  Goals Unmet:  Not Applicable  Comments: Service time is from 1018 to 1135    Dr. [name] is Medical Director for Cardiac Rehab at [facility].

## 2020-12-23 ENCOUNTER — Encounter (HOSPITAL_COMMUNITY)
Admission: RE | Admit: 2020-12-23 | Discharge: 2020-12-23 | Disposition: A | Discharge status: Home / Self Care | Payer: Medicare Other | Source: Ambulatory Visit | Attending: Internal Medicine | Admitting: Internal Medicine

## 2020-12-23 ENCOUNTER — Other Ambulatory Visit: Payer: Self-pay

## 2020-12-23 DIAGNOSIS — I5022 Chronic systolic (congestive) heart failure: Secondary | ICD-10-CM | POA: Insufficient documentation

## 2020-12-23 NOTE — Progress Notes (Signed)
Daily Session Note  Patient Details  Name: Cynthia Herrera MRN: [id number] Date of Birth: 1946-01-15 Referring Provider:   [name] Pulmonary Rehab Walk Test from 12/08/2020 in [place]  Referring Provider Dr. Chase Caller      Encounter Date: 12/23/2020  Check In:  Session Check In - 12/23/20 1044      Check-In   Supervising physician immediately available to respond to emergencies [facility] immediately available    Physician(s) Dr. Venetia Constable    Location [facility]    Staff Present Rosebud Poles, RN, BSN;Carlette Wilber Oliphant, RN, BSN;Corvette Orser Ysidro Evert, RN;Jessica Hassell Done, MS, ACSM-CEP, Exercise Physiologist    Virtual Visit No    Medication changes reported     No    Fall or balance concerns reported    No    Tobacco Cessation No Change    Warm-up and Cool-down Performed on first and last piece of equipment    Resistance Training Performed Yes    VAD Patient? No    PAD/SET Patient? No      Pain Assessment   Currently in Pain? No/denies    Multiple Pain Sites No           Capillary Blood Glucose: No results found for this or any previous visit (from the past 24 hour(s)).    Social History   Tobacco Use  Smoking Status Former Smoker  . Packs/day: 1.50  . Years: 35.00  . Pack years: 52.50  . Types: Cigarettes  . Quit date: 08/24/2001  . Years since quitting: 19.3  Smokeless Tobacco Never Used    Goals Met:  Exercise tolerated well No report of cardiac concerns or symptoms Strength training completed today  Goals Unmet:  Not Applicable  Comments: Service time is from 1025 to 1140    Dr. [name] is Medical Director for Cardiac Rehab at [facility].

## 2020-12-23 NOTE — Progress Notes (Signed)
Pulmonary Individual Treatment Plan  Patient Details  Name: Cynthia Herrera MRN: [id number] Date of Birth: October 06, 1946 Referring Provider:   [name] Pulmonary Rehab Walk Test from 12/08/2020 in [place]  Referring Provider Dr. Chase Caller      Initial Encounter Date:  Flowsheet Row Pulmonary Rehab Walk Test from 12/08/2020 in [place]  Date 12/08/20      Visit Diagnosis: Heart failure, chronic systolic (HCC)  Patient's Home Medications on Admission:   Current Outpatient Medications:  .  amoxicillin (AMOXIL) 500 MG capsule, Take 2,000 mg by mouth See admin instructions. Take 2000 mg by mouth 1 hour prior to dental appointment, Disp: , Rfl: 5 .  atorvastatin (LIPITOR) 40 MG tablet, TAKE 1 TABLET BY MOUTH EVERYDAY AT BEDTIME, Disp: 90 tablet, Rfl: 2 .  Cholecalciferol (VITAMIN D3) 2000 units capsule, Take 2,000 Units by mouth daily., Disp: , Rfl:  .  clotrimazole-betamethasone (LOTRISONE) cream, Apply 1 application topically 2 (two) times daily., Disp: 30 g, Rfl: 0 .  cyclobenzaprine (FLEXERIL) 5 MG tablet, TAKE 1 TABLET BY MOUTH AT BEDTIME AS NEEDED FOR MUSCLE SPASMS (Patient not taking: Reported on 12/08/2020), Disp: 20 tablet, Rfl: 0 .  fexofenadine (ALLEGRA) 180 MG tablet, Take 180 mg by mouth daily as needed for allergies or rhinitis., Disp: , Rfl:  .  fluconazole (DIFLUCAN) 150 MG tablet, TAKE 1 TABLET BY MOUTH ONE TIME PER WEEK, Disp: 6 tablet, Rfl: 2 .  fluticasone (FLONASE) 50 MCG/ACT nasal spray, Place 1 spray into both nostrils daily as needed for allergies. , Disp: , Rfl:  .  furosemide (LASIX) 20 MG tablet, TAKE 1 TABLET BY MOUTH TWICE A DAY, Disp: 180 tablet, Rfl: 1 .  gabapentin (NEURONTIN) 300 MG capsule, Take 2 capsules (600 mg total) by mouth at bedtime., Disp: 180 capsule, Rfl: 1 .  glucose blood test strip, Use to check CBG AC and QHS., Disp: 200 each, Rfl: 12 .  KLOR-CON M20 20 MEQ tablet, TAKE 1  TABLET BY MOUTH EVERY DAY, Disp: 90 tablet, Rfl: 1 .  letrozole (FEMARA) 2.5 MG tablet, Take 1 tablet (2.5 mg total) by mouth daily., Disp: 90 tablet, Rfl: 3 .  losartan (COZAAR) 25 MG tablet, TAKE 1 TABLET BY MOUTH TWICE A DAY, Disp: 180 tablet, Rfl: 3 .  metFORMIN (GLUCOPHAGE) 500 MG tablet, TAKE 1 TABLET BY MOUTH TWICE A DAY WITH MEALS, Disp: 180 tablet, Rfl: 0 .  nebivolol (BYSTOLIC) 2.5 MG tablet, TAKE 1 TABLET BY MOUTH EVERY DAY, Disp: 90 tablet, Rfl: 1 .  pantoprazole (PROTONIX) 40 MG tablet, Take 1 tablet (40 mg total) by mouth daily. Please call and schedule an appointment with Dr Rayann Heman for further refills, Disp: 90 tablet, Rfl: 0 .  spironolactone (ALDACTONE) 25 MG tablet, TAKE 1 TABLET BY MOUTH EVERY DAY, Disp: 90 tablet, Rfl: 1 .  traMADol (ULTRAM) 50 MG tablet, Take 50 mg by mouth every 6 (six) hours as needed for moderate pain., Disp: , Rfl:  .  XARELTO 20 MG TABS tablet, TAKE 1 TABLET BY MOUTH EVERY DAY WITH SUPPER, Disp: 30 tablet, Rfl: 10  Past Medical History: Past Medical History:  Diagnosis Date  . Arthritis   . Breast cancer, left breast ([place]) 2002   "then treated w/chemo and radiation"  . Breast cancer, left breast ([place]) 07/14/2017   "tx'd w/mastectomy"  . Chronic back pain    h/o lumbar stenosis; "no problem since my OR" (`10/27/2017)  . Chronic systolic CHF (  congestive heart failure) ([place])   . Complication of anesthesia    last surgery iv med when going to sleep"burned as injected"  . COPD (chronic obstructive pulmonary disease) (HCC)    no inhalers   . DCM (dilated cardiomyopathy) ([place])    EF 15-20% ? tachycardia induced - EF 35-40% by echo 05/2018  . Dyspnea    with exertion  . Gallstones   . GERD (gastroesophageal reflux disease)    once in a while;depends on what she eats  . History of bronchitis    "chronic when I smoked; no problem since I quit in 2002" (10/27/2017)  . Hyperlipidemia   . Hypertension    takes Losartan and Metoprolol.   . Joint pain   .  Muscle spasm    takes Flexeril daily as needed   . Nocturia   . OSA (obstructive sleep apnea) 08/22/2018   Severe obstructive sleep apnea with an AHI 47.3/h and no significant central sleep apnea with moderate oxygen desaturations as low as 79%  . Osteopenia   . Persistent atrial fibrillation ([place])    s/p TEE DCCV and repeat DCCV 11/14/2013 and 02/2018  . Personal history of colonic polyps    adenomas 03 and 08  . Pneumonia    hx  . Seasonal allergies   . Thyroid nodule   . Type II diabetes mellitus ([place])   . Vitamin D deficiency    takes Vit D    Tobacco Use: Social History   Tobacco Use  Smoking Status Former Smoker  . Packs/day: 1.50  . Years: 35.00  . Pack years: 52.50  . Types: Cigarettes  . Quit date: 08/24/2001  . Years since quitting: 19.3  Smokeless Tobacco Never Used    Labs: Recent Review Flowsheet Data    Labs for ITP Cardiac and Pulmonary Rehab Latest Ref Rng & Units 01/02/2019 07/18/2019 11/15/2019 01/01/2020 02/12/2020   Cholestrol 100 - 199 mg/dL - - - 140 -   LDLCALC 0 - 99 mg/dL - - - 70 -   HDL >39 mg/dL - - - 43 -   Trlycerides 0 - 149 mg/dL - - - 159(H) -   Hemoglobin A1c 4.0 - 5.6 % 5.4 6.3(A) 7.4(A) - 6.7(A)   PHART 7.350 - 7.450 - - - - -   PCO2ART 35.0 - 45.0 mmHg - - - - -   HCO3 20.0 - 24.0 mEq/L - - - - -   TCO2 0 - 100 mmol/L - - - - -   ACIDBASEDEF 0.0 - 2.0 mmol/L - - - - -   O2SAT % - - - - -      Capillary Blood Glucose: Lab Results  Component Value Date   GLUCAP 139 (H) 12/12/2018   GLUCAP 137 (H) 12/12/2018   GLUCAP 117 (H) 03/24/2018   GLUCAP 91 03/22/2018   GLUCAP 125 (H) 03/22/2018    POCT Glucose    Row Name 12/16/20 1159             POCT Blood Glucose   Pre-Exercise 225 mg/dL       Post-Exercise 144 mg/dL              Pulmonary Assessment Scores:  Pulmonary Assessment Scores    Row Name 12/08/20 1130 12/08/20 1227       ADL UCSD   ADL Phase - Entry    SOB Score total - 49         CAT Score   CAT Score  -  16         mMRC Score   mMRC Score 3 3          UCSD: Self-administered rating of dyspnea associated with activities of daily living (ADLs) 6-point scale (0 = "not at all" to 5 = "maximal or unable to do because of breathlessness")  Scoring Scores range from 0 to 120.  Minimally important difference is 5 units  CAT: CAT can identify the health impairment of COPD patients and is better correlated with disease progression.  CAT has a scoring range of zero to 40. The CAT score is classified into four groups of low (less than 10), medium (10 - 20), high (21-30) and very high (31-40) based on the impact level of disease on health status. A CAT score over 10 suggests significant symptoms.  A worsening CAT score could be explained by an exacerbation, poor medication adherence, poor inhaler technique, or progression of COPD or comorbid conditions.  CAT MCID is 2 points  mMRC: mMRC (Modified Medical Research Council) Dyspnea Scale is used to assess the degree of baseline functional disability in patients of respiratory disease due to dyspnea. No minimal important difference is established. A decrease in score of 1 point or greater is considered a positive change.   Pulmonary Function Assessment:  Pulmonary Function Assessment - 12/08/20 1112      Breath   Bilateral Breath Sounds Clear    Shortness of Breath Yes;Limiting activity           Exercise Target Goals: Exercise Program Goal: Individual exercise prescription set using results from initial 6 min walk test and THRR while considering  patient's activity barriers and safety.   Exercise Prescription Goal: Initial exercise prescription builds to 30-45 minutes a day of aerobic activity, 2-3 days per week.  Home exercise guidelines will be given to patient during program as part of exercise prescription that the participant will acknowledge.  Activity Barriers & Risk Stratification:  Activity Barriers & Cardiac Risk Stratification  - 12/08/20 1109      Activity Barriers & Cardiac Risk Stratification   Activity Barriers Arthritis;Back Problems;Left Knee Replacement;Right Knee Replacement;Joint Problems;Deconditioning;Muscular Weakness           6 Minute Walk:  6 Minute Walk    Row Name 12/08/20 1228         6 Minute Walk   Phase Initial     Distance 869 feet     Walk Time 6 minutes     # of Rest Breaks 1     MPH 1.64     METS 1.25     RPE 12     Perceived Dyspnea  2     VO2 Peak 4.38     Symptoms Yes (comment)     Comments Back pain and shortness of breath caused her to rest x 1, she has a limp and pushing a wheelchair steadied her     Resting HR 68 bpm     Resting BP 102/60     Resting Oxygen Saturation  95 %     Exercise Oxygen Saturation  during 6 min walk 88 %     Max Ex. HR 94 bpm     Max Ex. BP 126/70     2 Minute Post BP 110/62           Interval HR   1 Minute HR 72     2 Minute HR 93     3 Minute HR 87  4 Minute HR 93     5 Minute HR 94     2 Minute Post HR 65     Interval Heart Rate? Yes           Interval Oxygen   Interval Oxygen? Yes     Baseline Oxygen Saturation % 95 %     1 Minute Oxygen Saturation % 88 %     1 Minute Liters of Oxygen 0 L     2 Minute Oxygen Saturation % 95 %     2 Minute Liters of Oxygen 0 L     3 Minute Oxygen Saturation % 93 %     3 Minute Liters of Oxygen 0 L     4 Minute Oxygen Saturation % 98 %     4 Minute Liters of Oxygen 0 L     5 Minute Oxygen Saturation % 96 %     5 Minute Liters of Oxygen 0 L     6 Minute Liters of Oxygen 0 L     2 Minute Post Oxygen Saturation % 98 %     2 Minute Post Liters of Oxygen 0 L            Oxygen Initial Assessment:  Oxygen Initial Assessment - 12/08/20 1534      Home Oxygen   Home Oxygen Device None    Sleep Oxygen Prescription None    Home Exercise Oxygen Prescription None    Home Resting Oxygen Prescription None      Initial 6 min Walk   Oxygen Used None      Program Oxygen Prescription    Program Oxygen Prescription None      Intervention   Short Term Goals To learn and understand importance of monitoring SPO2 with pulse oximeter and demonstrate accurate use of the pulse oximeter.;To learn and understand importance of maintaining oxygen saturations>88%;To learn and demonstrate proper pursed lip breathing techniques or other breathing techniques.;To learn and demonstrate proper use of respiratory medications    Long  Term Goals Verbalizes importance of monitoring SPO2 with pulse oximeter and return demonstration;Maintenance of O2 saturations>88%;Exhibits proper breathing techniques, such as pursed lip breathing or other method taught during program session;Compliance with respiratory medication;Demonstrates proper use of MDI's           Oxygen Re-Evaluation:  Oxygen Re-Evaluation    Row Name 12/22/20 0940             Program Oxygen Prescription   Program Oxygen Prescription None               Home Oxygen   Home Oxygen Device None       Sleep Oxygen Prescription None       Home Exercise Oxygen Prescription None       Home Resting Oxygen Prescription None               Goals/Expected Outcomes   Short Term Goals To learn and understand importance of monitoring SPO2 with pulse oximeter and demonstrate accurate use of the pulse oximeter.;To learn and understand importance of maintaining oxygen saturations>88%;To learn and demonstrate proper pursed lip breathing techniques or other breathing techniques.;To learn and demonstrate proper use of respiratory medications       Long  Term Goals Verbalizes importance of monitoring SPO2 with pulse oximeter and return demonstration;Maintenance of O2 saturations>88%;Exhibits proper breathing techniques, such as pursed lip breathing or other method taught during program session;Compliance with respiratory medication;Demonstrates proper use of MDI's  Comments Pt needs cues for pursed lip breathing,       Goals/Expected Outcomes  Compliance and understanding of oxygen saturation and pursed lip breathing.              Oxygen Discharge (Final Oxygen Re-Evaluation):  Oxygen Re-Evaluation - 12/22/20 0940      Program Oxygen Prescription   Program Oxygen Prescription None      Home Oxygen   Home Oxygen Device None    Sleep Oxygen Prescription None    Home Exercise Oxygen Prescription None    Home Resting Oxygen Prescription None      Goals/Expected Outcomes   Short Term Goals To learn and understand importance of monitoring SPO2 with pulse oximeter and demonstrate accurate use of the pulse oximeter.;To learn and understand importance of maintaining oxygen saturations>88%;To learn and demonstrate proper pursed lip breathing techniques or other breathing techniques.;To learn and demonstrate proper use of respiratory medications    Long  Term Goals Verbalizes importance of monitoring SPO2 with pulse oximeter and return demonstration;Maintenance of O2 saturations>88%;Exhibits proper breathing techniques, such as pursed lip breathing or other method taught during program session;Compliance with respiratory medication;Demonstrates proper use of MDI's    Comments Pt needs cues for pursed lip breathing,    Goals/Expected Outcomes Compliance and understanding of oxygen saturation and pursed lip breathing.           Initial Exercise Prescription:  Initial Exercise Prescription - 12/09/20 1500      Date of Initial Exercise RX and Referring Provider   Date 12/08/20    Referring Provider Dr. Chase Caller    Expected Discharge Date 02/12/21      NuStep   Level 1    SPM 70    Minutes 30      Prescription Details   Frequency (times per week) 2    Duration Progress to 45 minutes of aerobic exercise without signs/symptoms of physical distress      Intensity   THRR 40-80% of Max Heartrate 58-117    Ratings of Perceived Exertion 11-13    Perceived Dyspnea 0-4      Progression   Progression Continue to progress  workloads to maintain intensity without signs/symptoms of physical distress.      Resistance Training   Training Prescription Yes    Weight orange bands    Reps 10-15           Perform Capillary Blood Glucose checks as needed.  Exercise Prescription Changes:  Exercise Prescription Changes    Row Name 12/16/20 1200 12/22/20 0900           Response to Exercise   Blood Pressure (Admit) 122/60 -      Blood Pressure (Exercise) 134/66 -      Blood Pressure (Exit) 103/66 -      Heart Rate (Admit) 68 bpm -      Heart Rate (Exercise) 95 bpm -      Heart Rate (Exit) 73 bpm -      Oxygen Saturation (Admit) 97 % -      Oxygen Saturation (Exercise) 94 % -      Oxygen Saturation (Exit) 99 % -      Rating of Perceived Exertion (Exercise) 13 -      Perceived Dyspnea (Exercise) 3 -      Duration Continue with 30 min of aerobic exercise without signs/symptoms of physical distress. -      Intensity -  40-80% HRR -  Progression   Average METs - 2.05             Resistance Training   Training Prescription Yes -      Weight orange bands -      Reps 10-15 -      Time 10 Minutes -             NuStep   Level 1 -      SPM 80 -      Minutes 15 -      METs 1.6 -             Track   Laps 9 -      Minutes 15 -             Exercise Comments:  Exercise Comments    Row Name 12/16/20 1232           Exercise Comments Pt completed first day of exercise and tolerated fair with minor complaints of chronic bilateral knee pain. She was able to do the Nustep for 20 minutes without having to rest and also walked the track for 10 minutes using a rollator. She completed resistance training with no mobility issues.              Exercise Goals and Review:  Exercise Goals    Row Name 12/08/20 1533             Exercise Goals   Increase Physical Activity Yes       Intervention Provide advice, education, support and counseling about physical activity/exercise  needs.;Develop an individualized exercise prescription for aerobic and resistive training based on initial evaluation findings, risk stratification, comorbidities and participant's personal goals.       Expected Outcomes Short Term: Attend rehab on a regular basis to increase amount of physical activity.;Long Term: Add in home exercise to make exercise part of routine and to increase amount of physical activity.;Long Term: Exercising regularly at least 3-5 days a week.       Increase Strength and Stamina Yes       Intervention Provide advice, education, support and counseling about physical activity/exercise needs.;Develop an individualized exercise prescription for aerobic and resistive training based on initial evaluation findings, risk stratification, comorbidities and participant's personal goals.       Expected Outcomes Short Term: Increase workloads from initial exercise prescription for resistance, speed, and METs.;Short Term: Perform resistance training exercises routinely during rehab and add in resistance training at home;Long Term: Improve cardiorespiratory fitness, muscular endurance and strength as measured by increased METs and functional capacity (6MWT)       Able to understand and use rate of perceived exertion (RPE) scale Yes       Intervention Provide education and explanation on how to use RPE scale       Expected Outcomes Short Term: Able to use RPE daily in rehab to express subjective intensity level;Long Term:  Able to use RPE to guide intensity level when exercising independently       Able to understand and use Dyspnea scale Yes       Intervention Provide education and explanation on how to use Dyspnea scale       Expected Outcomes Short Term: Able to use Dyspnea scale daily in rehab to express subjective sense of shortness of breath during exertion;Long Term: Able to use Dyspnea scale to guide intensity level when exercising independently       Knowledge and understanding of  Target Heart Rate  Range (THRR) Yes       Intervention Provide education and explanation of THRR including how the numbers were predicted and where they are located for reference       Expected Outcomes Short Term: Able to state/look up THRR;Long Term: Able to use THRR to govern intensity when exercising independently;Short Term: Able to use daily as guideline for intensity in rehab       Understanding of Exercise Prescription Yes       Intervention Provide education, explanation, and written materials on patient's individual exercise prescription       Expected Outcomes Short Term: Able to explain program exercise prescription;Long Term: Able to explain home exercise prescription to exercise independently              Exercise Goals Re-Evaluation :  Exercise Goals Re-Evaluation    [place] Name 12/22/20 0936             Exercise Goal Re-Evaluation   Exercise Goals Review Increase Physical Activity;Increase Strength and Stamina;Able to understand and use rate of perceived exertion (RPE) scale;Able to understand and use Dyspnea scale;Knowledge and understanding of Target Heart Rate Range (THRR);Understanding of Exercise Prescription       Comments Pt has completed 2 exercise sessions and is learning to become independent with exercise. She does the Nustep for 20 minutes and walks for 10 minutes using a rolator. She has tolerated exercise well so far. It is too early to see any progression, but we will continue to monitor and progress as she is able. She is currently exercising at 1.7 METS on the Nustep and 2.05 METS walking the track.       Expected Outcomes Through exercise at rehab and home the patient will decrease shortness of breath with daily activities and feel confident in carrying out an exercise regimn at home.              Discharge Exercise Prescription (Final Exercise Prescription Changes):  Exercise Prescription Changes - 12/22/20 0900      Progression   Average METs 2.05            Nutrition:  Target Goals: Understanding of nutrition guidelines, daily intake of sodium '1500mg'$ , cholesterol '200mg'$ , calories 30% from fat and 7% or less from saturated fats, daily to have 5 or more servings of fruits and vegetables.  Biometrics:  Pre Biometrics - 12/08/20 1110      Pre Biometrics   Height $Remov'5\' 6"'SJydnC$  (1.676 m)    Weight 107.1 kg    BMI (Calculated) 38.13    Grip Strength 18 kg            Nutrition Therapy Plan and Nutrition Goals:   Nutrition Assessments:  MEDIFICTS Score Key:  ?70 Need to make dietary changes   40-70 Heart Healthy Diet  ? 40 Therapeutic Level Cholesterol Diet   Picture Your Plate Scores:  <29 Unhealthy dietary pattern with much room for improvement.  41-50 Dietary pattern unlikely to meet recommendations for good health and room for improvement.  51-60 More healthful dietary pattern, with some room for improvement.   >60 Healthy dietary pattern, although there may be some specific behaviors that could be improved.    Nutrition Goals Re-Evaluation:   Nutrition Goals Discharge (Final Nutrition Goals Re-Evaluation):   Psychosocial: Target Goals: Acknowledge presence or absence of significant depression and/or stress, maximize coping skills, provide positive support system. Participant is able to verbalize types and ability to use techniques and skills needed for reducing stress  and depression.  Initial Review & Psychosocial Screening:  Initial Psych Review & Screening - 12/08/20 1121      Initial Review   Current issues with None Identified      Family Dynamics   Good Support System? Yes   son and daughter   Comments No concerns identified      Barriers   Psychosocial barriers to participate in program There are no identifiable barriers or psychosocial needs.      Screening Interventions   Interventions Encouraged to exercise           Quality of Life Scores:  Scores of 19 and below usually indicate a  poorer quality of life in these areas.  A difference of  2-3 points is a clinically meaningful difference.  A difference of 2-3 points in the total score of the Quality of Life Index has been associated with significant improvement in overall quality of life, self-image, physical symptoms, and general health in studies assessing change in quality of life.  PHQ-9: Recent Review Flowsheet Data    Depression screen [facility] 2/9 12/08/2020 02/12/2020 01/02/2019 05/22/2018   Decreased Interest 0 0 0 0   Down, Depressed, Hopeless 0 0 0 0   PHQ - 2 Score 0 0 0 0   Altered sleeping 0 0 - -   Tired, decreased energy 0 1 - -   Change in appetite 0 0 - -   Feeling bad or failure about yourself  0 0 - -   Trouble concentrating 0 0 - -   Moving slowly or fidgety/restless 0 0 - -   Suicidal thoughts 0 0 - -   PHQ-9 Score - 1 - -   Difficult doing work/chores Not difficult at all Not difficult at all - -     Interpretation of Total Score  Total Score Depression Severity:  1-4 = Minimal depression, 5-9 = Mild depression, 10-14 = Moderate depression, 15-19 = Moderately severe depression, 20-27 = Severe depression   Psychosocial Evaluation and Intervention:  Psychosocial Evaluation - 12/08/20 1123      Psychosocial Evaluation & Interventions   Interventions Encouraged to exercise with the program and follow exercise prescription    Continue Psychosocial Services  No Follow up required           Psychosocial Re-Evaluation:  Psychosocial Re-Evaluation    [place] Name 12/18/20 0932             Psychosocial Re-Evaluation   Current issues with None Identified       Comments No concerns identified.       Expected Outcomes To continue to have no psychosocial concerns while in pulmonary rehab.       Interventions Encouraged to attend Pulmonary Rehabilitation for the exercise       Continue Psychosocial Services  No Follow up required              Psychosocial Discharge (Final Psychosocial  Re-Evaluation):  Psychosocial Re-Evaluation - 12/18/20 0932      Psychosocial Re-Evaluation   Current issues with None Identified    Comments No concerns identified.    Expected Outcomes To continue to have no psychosocial concerns while in pulmonary rehab.    Interventions Encouraged to attend Pulmonary Rehabilitation for the exercise    Continue Psychosocial Services  No Follow up required           Education: Education Goals: Education classes will be provided on a weekly basis, covering required topics. Participant will state  understanding/return demonstration of topics presented.  Learning Barriers/Preferences:  Learning Barriers/Preferences - 12/08/20 1124      Learning Barriers/Preferences   Learning Barriers None    Learning Preferences Written Material           Education Topics: Risk Factor Reduction:  -Group instruction that is supported by a PowerPoint presentation. Instructor discusses the definition of a risk factor, different risk factors for pulmonary disease, and how the heart and lungs work together.     Nutrition for Pulmonary Patient:  -Group instruction provided by PowerPoint slides, verbal discussion, and written materials to support subject matter. The instructor gives an explanation and review of healthy diet recommendations, which includes a discussion on weight management, recommendations for fruit and vegetable consumption, as well as protein, fluid, caffeine, fiber, sodium, sugar, and alcohol. Tips for eating when patients are short of breath are discussed. Flowsheet Row PULMONARY REHAB OTHER RESPIRATORY from 12/16/2020 in [place]  Date 12/16/20  Educator handout      Pursed Lip Breathing:  -Group instruction that is supported by demonstration and informational handouts. Instructor discusses the benefits of pursed lip and diaphragmatic breathing and detailed demonstration on how to preform both.     Oxygen  Safety:  -Group instruction provided by PowerPoint, verbal discussion, and written material to support subject matter. There is an overview of "What is Oxygen" and "Why do we need it".  Instructor also reviews how to create a safe environment for oxygen use, the importance of using oxygen as prescribed, and the risks of noncompliance. There is a brief discussion on traveling with oxygen and resources the patient may utilize.   Oxygen Equipment:  -Group instruction provided by [facility] Staff utilizing handouts, written materials, and equipment demonstrations.   Signs and Symptoms:  -Group instruction provided by written material and verbal discussion to support subject matter. Warning signs and symptoms of infection, stroke, and heart attack are reviewed and when to call the physician/911 reinforced. Tips for preventing the spread of infection discussed.   Advanced Directives:  -Group instruction provided by verbal instruction and written material to support subject matter. Instructor reviews Advanced Directive laws and proper instruction for filling out document.   Pulmonary Video:  -Group video education that reviews the importance of medication and oxygen compliance, exercise, good nutrition, pulmonary hygiene, and pursed lip and diaphragmatic breathing for the pulmonary patient.   Exercise for the Pulmonary Patient:  -Group instruction that is supported by a PowerPoint presentation. Instructor discusses benefits of exercise, core components of exercise, frequency, duration, and intensity of an exercise routine, importance of utilizing pulse oximetry during exercise, safety while exercising, and options of places to exercise outside of rehab.     Pulmonary Medications:  -Verbally interactive group education provided by instructor with focus on inhaled medications and proper administration.   Anatomy and Physiology of the Respiratory System and Intimacy:  -Group instruction provided  by PowerPoint, verbal discussion, and written material to support subject matter. Instructor reviews respiratory cycle and anatomical components of the respiratory system and their functions. Instructor also reviews differences in obstructive and restrictive respiratory diseases with examples of each. Intimacy, Sex, and Sexuality differences are reviewed with a discussion on how relationships can change when diagnosed with pulmonary disease. Common sexual concerns are reviewed.   MD DAY -A group question and answer session with a medical doctor that allows participants to ask questions that relate to their pulmonary disease state.   OTHER EDUCATION -Group or individual  verbal, written, or video instructions that support the educational goals of the pulmonary rehab program.   Holiday Eating Survival Tips:  -Group instruction provided by PowerPoint slides, verbal discussion, and written materials to support subject matter. The instructor gives patients tips, tricks, and techniques to help them not only survive but enjoy the holidays despite the onslaught of food that accompanies the holidays.   Knowledge Questionnaire Score:  Knowledge Questionnaire Score - 12/08/20 1227      Knowledge Questionnaire Score   Pre Score 10/18           Core Components/Risk Factors/Patient Goals at Admission:  Personal Goals and Risk Factors at Admission - 12/08/20 1124      Core Components/Risk Factors/Patient Goals on Admission   Improve shortness of breath with ADL's Yes    Intervention Provide education, individualized exercise plan and daily activity instruction to help decrease symptoms of SOB with activities of daily living.    Expected Outcomes Short Term: Improve cardiorespiratory fitness to achieve a reduction of symptoms when performing ADLs;Long Term: Be able to perform more ADLs without symptoms or delay the onset of symptoms           Core Components/Risk Factors/Patient Goals Review:    Goals and Risk Factor Review    Row Name 12/18/20 0933             Core Components/Risk Factors/Patient Goals Review   Personal Goals Review Heart Failure       Review She just started program, has exercised once with [place], for now her heart failure is stable.       Expected Outcomes See admission goals.              Core Components/Risk Factors/Patient Goals at Discharge (Final Review):   Goals and Risk Factor Review - 12/18/20 0933      Core Components/Risk Factors/Patient Goals Review   Personal Goals Review Heart Failure    Review She just started program, has exercised once with [place], for now her heart failure is stable.    Expected Outcomes See admission goals.           ITP Comments:   Comments: ITP REVIEW Pt is making expected progress toward pulmonary rehab goals after completing 2 sessions. Recommend continued exercise, life style modification, education, and utilization of breathing techniques to increase stamina and strength and decrease shortness of breath with exertion.

## 2020-12-24 ENCOUNTER — Ambulatory Visit (INDEPENDENT_AMBULATORY_CARE_PROVIDER_SITE_OTHER): Payer: Medicare Other | Admitting: Internal Medicine

## 2020-12-24 ENCOUNTER — Encounter: Payer: Self-pay | Admitting: Internal Medicine

## 2020-12-24 VITALS — BP 110/68 | HR 65 | Temp 98.1°F | Ht 66.0 in | Wt 238.7 lb

## 2020-12-24 DIAGNOSIS — I70209 Unspecified atherosclerosis of native arteries of extremities, unspecified extremity: Secondary | ICD-10-CM | POA: Diagnosis not present

## 2020-12-24 DIAGNOSIS — G4733 Obstructive sleep apnea (adult) (pediatric): Secondary | ICD-10-CM | POA: Diagnosis not present

## 2020-12-24 DIAGNOSIS — E1140 Type 2 diabetes mellitus with diabetic neuropathy, unspecified: Secondary | ICD-10-CM

## 2020-12-24 DIAGNOSIS — I5042 Chronic combined systolic (congestive) and diastolic (congestive) heart failure: Secondary | ICD-10-CM | POA: Diagnosis not present

## 2020-12-24 DIAGNOSIS — E1151 Type 2 diabetes mellitus with diabetic peripheral angiopathy without gangrene: Secondary | ICD-10-CM | POA: Diagnosis not present

## 2020-12-24 DIAGNOSIS — I4819 Other persistent atrial fibrillation: Secondary | ICD-10-CM | POA: Diagnosis not present

## 2020-12-24 DIAGNOSIS — R0602 Shortness of breath: Secondary | ICD-10-CM | POA: Diagnosis not present

## 2020-12-24 LAB — POCT GLYCOSYLATED HEMOGLOBIN (HGB A1C): Hemoglobin A1C: 6.7 % — AB (ref 4.0–5.6)

## 2020-12-24 NOTE — Progress Notes (Signed)
Established Patient Office Visit     This visit occurred during the SARS-CoV-2 public health emergency.  Safety protocols were in place, including screening questions prior to the visit, additional usage of staff PPE, and extensive cleaning of exam room while observing appropriate contact time as indicated for disinfecting solutions.    CC/Reason for Visit: 108-month follow-up chronic medical conditions  HPI: Cynthia Herrera is a 75 y.o. female who is coming in today for the above mentioned reasons. Past Medical History is significant for:  hx of breast cas/p left mastectomy, arthritis,chronic combinedCHF, COPD, hypertension, hyperlipidemia, OSA, osteopenia, thyroid nodule and DM II,also chronic A. fib.    Diabetes is complicated by peripheral diabetic neuropathy that is fairly well controlled on 600 mg of nightly gabapentin.  She has started pulmonary rehab as of last week.  She is otherwise doing well.   Past Medical/Surgical History: Past Medical History:  Diagnosis Date  . Arthritis   . Breast cancer, left breast ([place]) 2002   "then treated w/chemo and radiation"  . Breast cancer, left breast ([place]) 07/14/2017   "tx'd w/mastectomy"  . Chronic back pain    h/o lumbar stenosis; "no problem since my OR" (`10/27/2017)  . Chronic systolic CHF (congestive heart failure) ([place])   . Complication of anesthesia    last surgery iv med when going to sleep"burned as injected"  . COPD (chronic obstructive pulmonary disease) ([facility])    no inhalers   . DCM (dilated cardiomyopathy) ([place])    EF 15-20% ? tachycardia induced - EF 35-40% by echo 05/2018  . Dyspnea    with exertion  . Gallstones   . GERD (gastroesophageal reflux disease)    once in a while;depends on what she eats  . History of bronchitis    "chronic when I smoked; no problem since I quit in 2002" (10/27/2017)  . Hyperlipidemia   . Hypertension    takes Losartan and Metoprolol.   . Joint pain   . Muscle spasm    takes  Flexeril daily as needed   . Nocturia   . OSA (obstructive sleep apnea) 08/22/2018   Severe obstructive sleep apnea with an AHI 47.3/h and no significant central sleep apnea with moderate oxygen desaturations as low as 79%  . Osteopenia   . Persistent atrial fibrillation ([place])    s/p TEE DCCV and repeat DCCV 11/14/2013 and 02/2018  . Personal history of colonic polyps    adenomas 03 and 08  . Pneumonia    hx  . Seasonal allergies   . Thyroid nodule   . Type II diabetes mellitus ([place])   . Vitamin D deficiency    takes Vit D    Past Surgical History:  Procedure Laterality Date  . ATRIAL FIBRILLATION ABLATION N/A 12/12/2018   Procedure: ATRIAL FIBRILLATION ABLATION;  Surgeon: Thompson Grayer, MD;  Location: [facility];  Service: Cardiovascular;  Laterality: N/A;  . AXILLARY LYMPH NODE DISSECTION Left 10/2001   persistent intramammary node Archie Endo 03/09/2011  . BACK SURGERY    . BREAST BIOPSY Left 2002  . BREAST BIOPSY Right 06/2017   "node bx was negative"  . BREAST BIOPSY Left 06/2017   "positive for cancer"  . BREAST LUMPECTOMY WITH RADIOACTIVE SEED LOCALIZATION Right 08/15/2017   Procedure: RIGHT BREAST LUMPECTOMY WITH RADIOACTIVE SEED LOCALIZATION;  Surgeon: Jovita Kussmaul, MD;  Location: [place];  Service: General;  Laterality: Right;  . CARDIAC CATHETERIZATION  2015  . CARDIOVERSION N/A 11/12/2013  Procedure: CARDIOVERSION;  Surgeon: Thayer Headings, MD;  Location: [facility] ENDOSCOPY;  Service: Cardiovascular;  Laterality: N/A;  10:08  Dr. Marissa Nestle, anesthesia present, [place]   60mg ,  propofol 50mg , IV for elective cardioversion....Dr. Cathie Olden delievered synch 120 joules with successful cardioversion to NSR  . CARDIOVERSION N/A 11/12/2013   Procedure: CARDIOVERSION;  Surgeon: Thayer Headings, MD;  Location: [place];  Service: Cardiovascular;  Laterality: N/A;  . CARDIOVERSION N/A 11/14/2013   Procedure: CARDIOVERSION (BEDSIDE);  Surgeon: Larey Dresser, MD;  Location: [place];   Service: Cardiovascular;  Laterality: N/A;  . CARDIOVERSION N/A 02/22/2018   Procedure: CARDIOVERSION;  Surgeon: Sueanne Margarita, MD;  Location: [facility] ENDOSCOPY;  Service: Cardiovascular;  Laterality: N/A;  . CATARACT EXTRACTION W/ INTRAOCULAR LENS  IMPLANT, BILATERAL Bilateral ~ 2016  . COLONOSCOPY  2003, September 2008, April 01, 2011   adenomas 03 and 08, polyp 12, diverticulosis  . COLONOSCOPY WITH PROPOFOL N/A 11/16/2016   Procedure: COLONOSCOPY WITH PROPOFOL;  Surgeon: Gatha Mayer, MD;  Location: [facility] ENDOSCOPY;  Service: Endoscopy;  Laterality: N/A;  . JOINT REPLACEMENT    . LAPAROSCOPIC CHOLECYSTECTOMY  2004  . LEFT AND RIGHT HEART CATHETERIZATION WITH CORONARY ANGIOGRAM N/A 02/01/2014   Procedure: LEFT AND RIGHT HEART CATHETERIZATION WITH CORONARY ANGIOGRAM;  Surgeon: Sueanne Margarita, MD;  Location: [place] CATH LAB;  Service: Cardiovascular;  Laterality: N/A;  . LUMBAR LAMINECTOMY/DECOMPRESSION MICRODISCECTOMY N/A 03/19/2014   Procedure: LUMBAR LAMINECTOMY/DECOMPRESSION MICRODISCECTOMY 4 LEVEL;  Surgeon: Kristeen Miss, MD;  Location: [place] NEURO ORS;  Service: Neurosurgery;  Laterality: N/A;  L1-2 L2-3 L3-4 L4-5 Laminectomy/Foraminotomy  . MASS EXCISION Left 08/15/2017   Procedure: EXCISION OF LEFT BREAST MASS;  Surgeon: Jovita Kussmaul, MD;  Location: [place];  Service: General;  Laterality: Left;  Marland Kitchen MASTECTOMY Left 10/27/2017  . MASTECTOMY PARTIAL / LUMPECTOMY W/ AXILLARY LYMPHADENECTOMY  05/08/2001   Archie Endo 03/09/2011  . PORT-A-CATH REMOVAL  2003  . PORTA CATH INSERTION  2002  . TEE WITHOUT CARDIOVERSION N/A 11/12/2013   Procedure: TRANSESOPHAGEAL ECHOCARDIOGRAM (TEE);  Surgeon: Thayer Headings, MD;  Location: [place];  Service: Cardiovascular;  Laterality: N/A;  pt b/p low, pt buccal membranes very dry, lips scapped, pt c/o thirst. NPO since MN and iv FLUIDS TOTAL INFUSING AT TOTAL 20ML HR....  Dr. Cathie Olden order allow NS to bolus during procedure....very dry,NS bolus 250 ml total..pt responding well  to meds..  . TOTAL KNEE ARTHROPLASTY Left 2006  . TOTAL KNEE ARTHROPLASTY Right 05/10/2016   Procedure: RIGHT TOTAL KNEE ARTHROPLASTY;  Surgeon: Gaynelle Arabian, MD;  Location: [facility];  Service: Orthopedics;  Laterality: Right;  With adductor block  . TOTAL MASTECTOMY Left 10/27/2017   Procedure: LEFT MASTECTOMY;  Surgeon: Jovita Kussmaul, MD;  Location: [place];  Service: General;  Laterality: Left;  . TUBAL LIGATION      Social History:  reports that she quit smoking about 19 years ago. Her smoking use included cigarettes. She has a 52.50 pack-year smoking history. She has never used smokeless tobacco. She reports that she does not drink alcohol and does not use drugs.  Allergies: Allergies  Allergen Reactions  . Tikosyn [Dofetilide]     Prolonged QT/QTc  . Zolpidem Other (See Comments)    Hallucinations, up walking around ? Hallucinations, up walking around  . Codeine Other (See Comments)  . Codeine Phosphate Nausea And Vomiting  . Methylprednisolone Other (See Comments)    Felt really weird w the high dose oral steroid, but tolerates low doses or  oral steroid Felt really weird w the high dose oral steroid, but tolerates low doses or oral steroid    Family History:  Family History  Problem Relation Age of Onset  . Dementia Sister   . Diabetes Sister   . Alzheimer's disease Sister   . Diabetes Brother        x 2  . Heart disease Brother   . Irritable bowel syndrome Brother   . Liver cancer Mother   . Diabetes Mother   . Pancreatic cancer Mother   . Diabetes Father   . Heart disease Father      Current Outpatient Medications:  .  amoxicillin (AMOXIL) 500 MG capsule, Take 2,000 mg by mouth See admin instructions. Take 2000 mg by mouth 1 hour prior to dental appointment, Disp: , Rfl: 5 .  atorvastatin (LIPITOR) 40 MG tablet, TAKE 1 TABLET BY MOUTH EVERYDAY AT BEDTIME, Disp: 90 tablet, Rfl: 2 .  Cholecalciferol (VITAMIN D3) 2000 units capsule, Take 2,000 Units by mouth daily.,  Disp: , Rfl:  .  clotrimazole-betamethasone (LOTRISONE) cream, Apply 1 application topically 2 (two) times daily., Disp: 30 g, Rfl: 0 .  fexofenadine (ALLEGRA) 180 MG tablet, Take 180 mg by mouth daily as needed for allergies or rhinitis., Disp: , Rfl:  .  fluconazole (DIFLUCAN) 150 MG tablet, TAKE 1 TABLET BY MOUTH ONE TIME PER WEEK, Disp: 6 tablet, Rfl: 2 .  fluticasone (FLONASE) 50 MCG/ACT nasal spray, Place 1 spray into both nostrils daily as needed for allergies. , Disp: , Rfl:  .  furosemide (LASIX) 20 MG tablet, TAKE 1 TABLET BY MOUTH TWICE A DAY, Disp: 180 tablet, Rfl: 1 .  gabapentin (NEURONTIN) 300 MG capsule, Take 2 capsules (600 mg total) by mouth at bedtime., Disp: 180 capsule, Rfl: 1 .  glucose blood test strip, Use to check CBG AC and QHS., Disp: 200 each, Rfl: 12 .  KLOR-CON M20 20 MEQ tablet, TAKE 1 TABLET BY MOUTH EVERY DAY, Disp: 90 tablet, Rfl: 1 .  letrozole (FEMARA) 2.5 MG tablet, Take 1 tablet (2.5 mg total) by mouth daily., Disp: 90 tablet, Rfl: 3 .  losartan (COZAAR) 25 MG tablet, TAKE 1 TABLET BY MOUTH TWICE A DAY, Disp: 180 tablet, Rfl: 3 .  metFORMIN (GLUCOPHAGE) 500 MG tablet, TAKE 1 TABLET BY MOUTH TWICE A DAY WITH MEALS, Disp: 180 tablet, Rfl: 0 .  nebivolol (BYSTOLIC) 2.5 MG tablet, TAKE 1 TABLET BY MOUTH EVERY DAY, Disp: 90 tablet, Rfl: 1 .  pantoprazole (PROTONIX) 40 MG tablet, Take 1 tablet (40 mg total) by mouth daily. Please call and schedule an appointment with Dr Rayann Heman for further refills, Disp: 90 tablet, Rfl: 0 .  spironolactone (ALDACTONE) 25 MG tablet, TAKE 1 TABLET BY MOUTH EVERY DAY, Disp: 90 tablet, Rfl: 1 .  traMADol (ULTRAM) 50 MG tablet, Take 50 mg by mouth as needed for moderate pain., Disp: , Rfl:  .  XARELTO 20 MG TABS tablet, TAKE 1 TABLET BY MOUTH EVERY DAY WITH SUPPER, Disp: 30 tablet, Rfl: 10  Review of Systems:  Constitutional: Denies fever, chills, diaphoresis, appetite change and fatigue.  HEENT: Denies photophobia, eye pain, redness,  hearing loss, ear pain, congestion, sore throat, rhinorrhea, sneezing, mouth sores, trouble swallowing, neck pain, neck stiffness and tinnitus.   Respiratory: Denies SOB, DOE, cough, chest tightness,  and wheezing.   Cardiovascular: Denies chest pain, palpitations and leg swelling.  Gastrointestinal: Denies nausea, vomiting, abdominal pain, diarrhea, constipation, blood in stool and abdominal distention.  Genitourinary:  Denies dysuria, urgency, frequency, hematuria, flank pain and difficulty urinating.  Endocrine: Denies: hot or cold intolerance, sweats, changes in hair or nails, polyuria, polydipsia. Musculoskeletal: Denies myalgias, back pain, joint swelling, arthralgias and gait problem.  Skin: Denies pallor, rash and wound.  Neurological: Denies dizziness, seizures, syncope, weakness, light-headedness, numbness and headaches.  Hematological: Denies adenopathy. Easy bruising, personal or family bleeding history  Psychiatric/Behavioral: Denies suicidal ideation, mood changes, confusion, nervousness, sleep disturbance and agitation    Physical Exam: Vitals:   12/24/20 1138  BP: 110/68  Pulse: 65  Temp: 98.1 F (36.7 C)  TempSrc: Oral  SpO2: 96%  Weight: 238 lb 11.2 oz (108.3 kg)  Height: 5\' 6"  (1.676 m)    Body mass index is 38.53 kg/m.   Constitutional: NAD, calm, comfortable Eyes: PERRL, lids and conjunctivae normal ENMT: Mucous membranes are moist.  Respiratory: clear to auscultation bilaterally, no wheezing, no crackles. Normal respiratory effort. No accessory muscle use.  Cardiovascular: Regular rate and rhythm, no murmurs / rubs / gallops. No extremity edema.  Psychiatric: Normal judgment and insight. Alert and oriented x 3. Normal mood.    Impression and Plan:  Type 2 diabetes mellitus with diabetic neuropathy, without long-term current use of insulin ([place])  -Well-controlled with an A1c of 6.7 today.  Persistent atrial fibrillation (H[facility]) -Rate controlled, followed  by cardiology  OSA (obstructive sleep apnea) -On nightly CPAP.  Chronic combined systolic and diastolic CHF (congestive heart failure) ([place]) -Compensated, followed by cardiology.  On spironolactone, Lasix, statin, losartan, Bystolic.  Shortness of breath on exertion -She has started pulmonary rehab as of last week.    Lelon Frohlich, MD [facility]

## 2020-12-25 ENCOUNTER — Other Ambulatory Visit: Payer: Self-pay | Admitting: Internal Medicine

## 2020-12-25 ENCOUNTER — Other Ambulatory Visit: Payer: Self-pay | Admitting: Cardiology

## 2020-12-25 ENCOUNTER — Other Ambulatory Visit: Payer: Self-pay

## 2020-12-25 ENCOUNTER — Encounter (HOSPITAL_COMMUNITY)
Admission: RE | Admit: 2020-12-25 | Discharge: 2020-12-25 | Disposition: A | Discharge status: Home / Self Care | Payer: Medicare Other | Source: Ambulatory Visit | Attending: Internal Medicine | Admitting: Internal Medicine

## 2020-12-25 DIAGNOSIS — I5022 Chronic systolic (congestive) heart failure: Secondary | ICD-10-CM | POA: Diagnosis not present

## 2020-12-25 DIAGNOSIS — I70209 Unspecified atherosclerosis of native arteries of extremities, unspecified extremity: Secondary | ICD-10-CM

## 2020-12-25 NOTE — Progress Notes (Signed)
Daily Session Note  Patient Details  Name: Cynthia Herrera MRN: [id number] Date of Birth: 07/16/1946 Referring Provider:   [name] Pulmonary Rehab Walk Test from 12/08/2020 in [place]  Referring Provider Dr. Chase Caller      Encounter Date: 12/25/2020  Check In:  Session Check In - 12/25/20 1032      Check-In   Supervising physician immediately available to respond to emergencies [facility] immediately available    Physician(s) Dr. Doristine Bosworth    Location [facility]    Staff Present Rosebud Poles, RN, BSN;Lisa Ysidro Evert, RN;Jessica Hassell Done, MS, ACSM-CEP, Exercise Physiologist    Virtual Visit No    Medication changes reported     No    Fall or balance concerns reported    No    Tobacco Cessation No Change    Warm-up and Cool-down Performed on first and last piece of equipment    Resistance Training Performed Yes    VAD Patient? No    PAD/SET Patient? No      Pain Assessment   Currently in Pain? No/denies    Multiple Pain Sites No           Capillary Blood Glucose: No results found for this or any previous visit (from the past 24 hour(s)).    Social History   Tobacco Use  Smoking Status Former Smoker  . Packs/day: 1.50  . Years: 35.00  . Pack years: 52.50  . Types: Cigarettes  . Quit date: 08/24/2001  . Years since quitting: 19.3  Smokeless Tobacco Never Used    Goals Met:  Exercise tolerated well No report of cardiac concerns or symptoms Strength training completed today  Goals Unmet:  Not Applicable  Comments: Service time is from 1017 to 1145    Dr. [name] is Medical Director for Cardiac Rehab at [facility].

## 2020-12-30 ENCOUNTER — Other Ambulatory Visit: Payer: Self-pay

## 2020-12-30 ENCOUNTER — Encounter (HOSPITAL_COMMUNITY)
Admission: RE | Admit: 2020-12-30 | Discharge: 2020-12-30 | Disposition: A | Discharge status: Home / Self Care | Payer: Medicare Other | Source: Ambulatory Visit | Attending: Internal Medicine | Admitting: Internal Medicine

## 2020-12-30 VITALS — Wt 235.7 lb

## 2020-12-30 DIAGNOSIS — I5022 Chronic systolic (congestive) heart failure: Secondary | ICD-10-CM

## 2020-12-30 NOTE — Progress Notes (Signed)
Daily Session Note  Patient Details  Name: Cynthia Herrera MRN: [id number] Date of Birth: 05-12-1946 Referring Provider:   [name] Pulmonary Rehab Walk Test from 12/08/2020 in [place]  Referring Provider Dr. Chase Caller      Encounter Date: 12/30/2020  Check In:  Session Check In - 12/30/20 1142      Check-In   Supervising physician immediately available to respond to emergencies [facility] immediately available    Physician(s) Dr. Doristine Bosworth    Location [facility]    Staff Present Rosebud Poles, RN, BSN;Lisa Ysidro Evert, RN;Jessica Hassell Done, MS, ACSM-CEP, Exercise Physiologist    Virtual Visit No    Medication changes reported     No    Fall or balance concerns reported    No    Tobacco Cessation No Change    Warm-up and Cool-down Performed on first and last piece of equipment    Resistance Training Performed Yes    VAD Patient? No    PAD/SET Patient? No      Pain Assessment   Currently in Pain? No/denies    Multiple Pain Sites No           Capillary Blood Glucose: No results found for this or any previous visit (from the past 24 hour(s)).   Exercise Prescription Changes - 12/30/20 1200      Response to Exercise   Blood Pressure (Admit) 124/70    Blood Pressure (Exercise) 136/70    Blood Pressure (Exit) 108/60    Heart Rate (Admit) 77 bpm    Heart Rate (Exercise) 75 bpm    Heart Rate (Exit) 70 bpm    Oxygen Saturation (Admit) 97 %    Oxygen Saturation (Exercise) 95 %    Oxygen Saturation (Exit) 95 %    Rating of Perceived Exertion (Exercise) 9    Perceived Dyspnea (Exercise) 1    Duration Continue with 30 min of aerobic exercise without signs/symptoms of physical distress.    Intensity THRR unchanged      Resistance Training   Training Prescription Yes    Weight orange bands    Reps 10-15    Time 10 Minutes      NuStep   Level 2    SPM 80    Minutes 30    METs 1.7           Social History    Tobacco Use  Smoking Status Former Smoker  . Packs/day: 1.50  . Years: 35.00  . Pack years: 52.50  . Types: Cigarettes  . Quit date: 08/24/2001  . Years since quitting: 19.3  Smokeless Tobacco Never Used    Goals Met:  Proper associated with RPD/PD & O2 Sat Exercise tolerated well Strength training completed today  Goals Unmet:  Not Applicable  Comments: Service time is from 1025 to 1140    Dr. [name] is Medical Director for Cardiac Rehab at [facility].

## 2020-12-30 NOTE — Progress Notes (Signed)
Cynthia Herrera 75 y.o. female Nutrition Note  Diagnosis: CHF  Past Medical History:  Diagnosis Date  . Arthritis   . Breast cancer, left breast ([place]) 2002   "then treated w/chemo and radiation"  . Breast cancer, left breast ([place]) 07/14/2017   "tx'd w/mastectomy"  . Chronic back pain    h/o lumbar stenosis; "no problem since my OR" (`10/27/2017)  . Chronic systolic CHF (congestive heart failure) ([place])   . Complication of anesthesia    last surgery iv med when going to sleep"burned as injected"  . COPD (chronic obstructive pulmonary disease) ([facility])    no inhalers   . DCM (dilated cardiomyopathy) ([place])    EF 15-20% ? tachycardia induced - EF 35-40% by echo 05/2018  . Dyspnea    with exertion  . Gallstones   . GERD (gastroesophageal reflux disease)    once in a while;depends on what she eats  . History of bronchitis    "chronic when I smoked; no problem since I quit in 2002" (10/27/2017)  . Hyperlipidemia   . Hypertension    takes Losartan and Metoprolol.   . Joint pain   . Muscle spasm    takes Flexeril daily as needed   . Nocturia   . OSA (obstructive sleep apnea) 08/22/2018   Severe obstructive sleep apnea with an AHI 47.3/h and no significant central sleep apnea with moderate oxygen desaturations as low as 79%  . Osteopenia   . Persistent atrial fibrillation ([place])    s/p TEE DCCV and repeat DCCV 11/14/2013 and 02/2018  . Personal history of colonic polyps    adenomas 03 and 08  . Pneumonia    hx  . Seasonal allergies   . Thyroid nodule   . Type II diabetes mellitus ([place])   . Vitamin D deficiency    takes Vit D     Medications reviewed.   Current Outpatient Medications:  .  amoxicillin (AMOXIL) 500 MG capsule, Take 2,000 mg by mouth See admin instructions. Take 2000 mg by mouth 1 hour prior to dental appointment, Disp: , Rfl: 5 .  atorvastatin (LIPITOR) 40 MG tablet, TAKE 1 TABLET BY MOUTH EVERYDAY AT BEDTIME, Disp: 90 tablet, Rfl: 2 .  Cholecalciferol (VITAMIN D3)  2000 units capsule, Take 2,000 Units by mouth daily., Disp: , Rfl:  .  clotrimazole-betamethasone (LOTRISONE) cream, Apply 1 application topically 2 (two) times daily., Disp: 30 g, Rfl: 0 .  fexofenadine (ALLEGRA) 180 MG tablet, Take 180 mg by mouth daily as needed for allergies or rhinitis., Disp: , Rfl:  .  fluconazole (DIFLUCAN) 150 MG tablet, TAKE 1 TABLET BY MOUTH ONE TIME PER WEEK, Disp: 6 tablet, Rfl: 2 .  fluticasone (FLONASE) 50 MCG/ACT nasal spray, Place 1 spray into both nostrils daily as needed for allergies. , Disp: , Rfl:  .  furosemide (LASIX) 20 MG tablet, TAKE 1 TABLET BY MOUTH TWICE A DAY, Disp: 180 tablet, Rfl: 1 .  gabapentin (NEURONTIN) 300 MG capsule, Take 2 capsules (600 mg total) by mouth at bedtime., Disp: 180 capsule, Rfl: 1 .  glucose blood test strip, Use to check CBG AC and QHS., Disp: 200 each, Rfl: 12 .  KLOR-CON M20 20 MEQ tablet, TAKE 1 TABLET BY MOUTH EVERY DAY, Disp: 90 tablet, Rfl: 1 .  letrozole (FEMARA) 2.5 MG tablet, Take 1 tablet (2.5 mg total) by mouth daily., Disp: 90 tablet, Rfl: 3 .  losartan (COZAAR) 25 MG tablet, TAKE 1 TABLET BY MOUTH TWICE A DAY, Disp:  180 tablet, Rfl: 3 .  metFORMIN (GLUCOPHAGE) 500 MG tablet, TAKE 1 TABLET BY MOUTH TWICE A DAY WITH MEALS, Disp: 180 tablet, Rfl: 0 .  nebivolol (BYSTOLIC) 2.5 MG tablet, TAKE 1 TABLET BY MOUTH EVERY DAY, Disp: 90 tablet, Rfl: 1 .  pantoprazole (PROTONIX) 40 MG tablet, Take 1 tablet (40 mg total) by mouth daily. Please call and schedule an appointment with Dr Rayann Heman for further refills, Disp: 90 tablet, Rfl: 0 .  spironolactone (ALDACTONE) 25 MG tablet, TAKE 1 TABLET BY MOUTH EVERY DAY, Disp: 90 tablet, Rfl: 1 .  traMADol (ULTRAM) 50 MG tablet, Take 50 mg by mouth as needed for moderate pain., Disp: , Rfl:  .  XARELTO 20 MG TABS tablet, TAKE 1 TABLET BY MOUTH EVERY DAY WITH SUPPER, Disp: 30 tablet, Rfl: 10   Ht Readings from Last 1 Encounters:  12/24/20 5\' 6"  (1.676 m)     Wt Readings from Last 3  Encounters:  12/24/20 238 lb 11.2 oz (108.3 kg)  12/18/20 238 lb 1.6 oz (108 kg)  12/08/20 236 lb 1.8 oz (107.1 kg)     There is no height or weight on file to calculate BMI.   Social History   Tobacco Use  Smoking Status Former Smoker  . Packs/day: 1.50  . Years: 35.00  . Pack years: 52.50  . Types: Cigarettes  . Quit date: 08/24/2001  . Years since quitting: 19.3  Smokeless Tobacco Never Used      Nutrition Note  Spoke with pt. Nutrition Plan and Nutrition Survey goals reviewed with pt.   Pt has Type 2 Diabetes. Last A1c indicates blood glucose well-controlled. Pt checks CBG's 1 times a week. Fasting CBG's reportedly <150 mg/dL.  She states getting off metformin and starting an injectable is her priority for diabetes.   Pt with dx of CHF. Per discussion, pt does use canned/convenience foods often. Pt does add salt to food. Pt does not eat out frequently.  Diet recall: Breakfast: honey nut cheerios with 2% milk Lunch: tortilla chips and queso Dinner: boiled chicken/pastry  Snacks: sometimes banana pudding, almond joys  Pt states she would like to lose weight. She lost 30 lbs prior to [place] pandemic by being more active and avoiding sweets and breads. She would like to lose 10-20 lbs now.   Pt expressed understanding of the information reviewed.    Nutrition Diagnosis ? Food-and nutrition-related knowledge deficit related to lack of exposure to information as related to diagnosis of: ? CHF ? Type 2 Diabetes  Nutrition Intervention ? Pt's individual nutrition plan reviewed with pt. ? Low sodium diet 2000 mg/day  ? Continue client-centered nutrition education by RD, as part of interdisciplinary care.  Goal(s) ? Pt to record daily weights ? Pt to identify and limit food sources of saturated fat, trans fat, refined carbohydrates and sodium ? Pt to identify food quantities necessary to achieve weight loss of 6-24 lb at graduation from cardiac rehab.   Plan:    Will provide client-centered nutrition education as part of interdisciplinary care  Monitor and evaluate progress toward nutrition goal with team.   Michaele Offer, MS, RDN, LDN

## 2021-01-01 ENCOUNTER — Encounter (HOSPITAL_COMMUNITY)
Admission: RE | Admit: 2021-01-01 | Discharge: 2021-01-01 | Disposition: A | Discharge status: Home / Self Care | Payer: Medicare Other | Source: Ambulatory Visit | Attending: Internal Medicine | Admitting: Internal Medicine

## 2021-01-01 ENCOUNTER — Other Ambulatory Visit: Payer: Self-pay

## 2021-01-01 DIAGNOSIS — I5022 Chronic systolic (congestive) heart failure: Secondary | ICD-10-CM | POA: Diagnosis not present

## 2021-01-01 NOTE — Progress Notes (Signed)
Daily Session Note  Patient Details  Name: Cynthia Herrera MRN: [id number] Date of Birth: Aug 12, 1946 Referring Provider:   [name] Pulmonary Rehab Walk Test from 12/08/2020 in [place]  Referring Provider Dr. Chase Caller      Encounter Date: 01/01/2021  Check In:   Capillary Blood Glucose: No results found for this or any previous visit (from the past 24 hour(s)).    Social History   Tobacco Use  Smoking Status Former Smoker  . Packs/day: 1.50  . Years: 35.00  . Pack years: 52.50  . Types: Cigarettes  . Quit date: 08/24/2001  . Years since quitting: 19.3  Smokeless Tobacco Never Used    Goals Met:  Proper associated with RPD/PD & O2 Sat Exercise tolerated well Strength training completed today  Goals Unmet:  Not Applicable  Comments: Service time is from 1025 to 1135    Dr. [name] is Medical Director for Cardiac Rehab at [facility].

## 2021-01-06 ENCOUNTER — Encounter (HOSPITAL_COMMUNITY)
Admission: RE | Admit: 2021-01-06 | Discharge: 2021-01-06 | Disposition: A | Discharge status: Home / Self Care | Payer: Medicare Other | Source: Ambulatory Visit | Attending: Internal Medicine | Admitting: Internal Medicine

## 2021-01-06 ENCOUNTER — Other Ambulatory Visit: Payer: Self-pay

## 2021-01-06 DIAGNOSIS — I5022 Chronic systolic (congestive) heart failure: Secondary | ICD-10-CM

## 2021-01-06 NOTE — Progress Notes (Signed)
Daily Session Note  Patient Details  Name: Cynthia Herrera MRN: [id number] Date of Birth: [birth year] Referring Provider:   [name] Pulmonary Rehab Walk Test from 12/08/2020 in [place]  Referring Provider Dr. Chase Caller      Encounter Date: 01/06/2021  Check In:  Session Check In - 01/06/21 1108      Check-In   Supervising physician immediately available to respond to emergencies [facility] immediately available    Physician(s) Dr. Arbutus Ped    Location [facility]    Staff Present Rosebud Poles, RN, BSN;Carlette Wilber Oliphant, RN, Isaac Laud, MS, ACSM-CEP, Exercise Physiologist    Virtual Visit No    Medication changes reported     No    Fall or balance concerns reported    No    Tobacco Cessation No Change    Warm-up and Cool-down Performed on first and last piece of equipment    Resistance Training Performed Yes    VAD Patient? No    PAD/SET Patient? No      Pain Assessment   Currently in Pain? No/denies    Multiple Pain Sites No           Capillary Blood Glucose: No results found for this or any previous visit (from the past 24 hour(s)).    Social History   Tobacco Use  Smoking Status Former Smoker  . Packs/day: 1.50  . Years: 35.00  . Pack years: 52.50  . Types: Cigarettes  . Quit date: 08/24/2001  . Years since quitting: 19.3  Smokeless Tobacco Never Used    Goals Met:  Proper associated with RPD/PD & O2 Sat Exercise tolerated well Strength training completed today  Goals Unmet:  Not Applicable  Comments: Service time is from 1025 to 1140    Dr. [name] is Medical Director for Cardiac Rehab at [facility].

## 2021-01-08 ENCOUNTER — Encounter (HOSPITAL_COMMUNITY)
Admission: RE | Admit: 2021-01-08 | Discharge: 2021-01-08 | Disposition: A | Discharge status: Home / Self Care | Payer: Medicare Other | Source: Ambulatory Visit | Attending: Internal Medicine | Admitting: Internal Medicine

## 2021-01-08 ENCOUNTER — Other Ambulatory Visit: Payer: Self-pay

## 2021-01-08 DIAGNOSIS — I5022 Chronic systolic (congestive) heart failure: Secondary | ICD-10-CM | POA: Diagnosis not present

## 2021-01-08 NOTE — Progress Notes (Signed)
Daily Session Note  Patient Details  Name: Cynthia Herrera MRN: [id number] Date of Birth: December 22, 1945 Referring Provider:   [name] Pulmonary Rehab Walk Test from 12/08/2020 in [place]  Referring Provider Dr. Chase Caller      Encounter Date: 01/08/2021  Check In:  Session Check In - 01/08/21 1028      Check-In   Supervising physician immediately available to respond to emergencies [facility] immediately available    Physician(s) Dr. Florencia Reasons    [facility]    Staff Present Rosebud Poles, RN, Milus Glazier, MS, EP-C, CCRP;Jessica Hassell Done, MS, ACSM-CEP, Exercise Physiologist    Virtual Visit No    Medication changes reported     No    Fall or balance concerns reported    No    Tobacco Cessation No Change    Warm-up and Cool-down Performed on first and last piece of equipment    Resistance Training Performed Yes    VAD Patient? No    PAD/SET Patient? No      Pain Assessment   Currently in Pain? No/denies    Multiple Pain Sites No           Capillary Blood Glucose: No results found for this or any previous visit (from the past 24 hour(s)).    Social History   Tobacco Use  Smoking Status Former Smoker  . Packs/day: 1.50  . Years: 35.00  . Pack years: 52.50  . Types: Cigarettes  . Quit date: 08/24/2001  . Years since quitting: 19.3  Smokeless Tobacco Never Used    Goals Met:  Proper associated with RPD/PD & O2 Sat Exercise tolerated well Strength training completed today  Goals Unmet:  Not Applicable  Comments: Service time is from 1023 to 1130.   Dr. [name] is Medical Director for Cardiac Rehab at [facility].

## 2021-01-13 ENCOUNTER — Encounter (HOSPITAL_COMMUNITY): Payer: Medicare Other

## 2021-01-14 ENCOUNTER — Telehealth: Payer: Self-pay | Admitting: Pharmacist

## 2021-01-14 ENCOUNTER — Ambulatory Visit: Payer: Medicare Other | Admitting: Cardiology

## 2021-01-14 NOTE — Chronic Care Management (AMB) (Signed)
Chronic Care Management Pharmacy Assistant   Name: Cynthia Herrera  MRN: [id number] DOB: 05/13/1946   Reason for Encounter: Hypertension Adherence Call     Recent office visits:  12/24/20-Hernandez Everardo Beals, MD (OV).  Recent consult visits:  12/25/20-Ramaswamy, Belva Crome, MD (Pulmonary Rehab).  Hospital visits:  None in previous 6 months  Medication changes indicated: 12/24/20-Cyclobenzaprine HCL 5 MG: (disc, patient preference).  Medications: Outpatient Encounter Medications as of 01/14/2021  Medication Sig  . amoxicillin (AMOXIL) 500 MG capsule Take 2,000 mg by mouth See admin instructions. Take 2000 mg by mouth 1 hour prior to dental appointment  . atorvastatin (LIPITOR) 40 MG tablet TAKE 1 TABLET BY MOUTH EVERYDAY AT BEDTIME  . Cholecalciferol (VITAMIN D3) 2000 units capsule Take 2,000 Units by mouth daily.  . clotrimazole-betamethasone (LOTRISONE) cream Apply 1 application topically 2 (two) times daily.  . fexofenadine (ALLEGRA) 180 MG tablet Take 180 mg by mouth daily as needed for allergies or rhinitis.  . fluconazole (DIFLUCAN) 150 MG tablet TAKE 1 TABLET BY MOUTH ONE TIME PER WEEK  . fluticasone (FLONASE) 50 MCG/ACT nasal spray Place 1 spray into both nostrils daily as needed for allergies.   . furosemide (LASIX) 20 MG tablet TAKE 1 TABLET BY MOUTH TWICE A DAY  . gabapentin (NEURONTIN) 300 MG capsule Take 2 capsules (600 mg total) by mouth at bedtime.  Marland Kitchen glucose blood test strip Use to check CBG AC and QHS.  Marland Kitchen KLOR-CON M20 20 MEQ tablet TAKE 1 TABLET BY MOUTH EVERY DAY  . letrozole (FEMARA) 2.5 MG tablet Take 1 tablet (2.5 mg total) by mouth daily.  Marland Kitchen losartan (COZAAR) 25 MG tablet TAKE 1 TABLET BY MOUTH TWICE A DAY  . metFORMIN (GLUCOPHAGE) 500 MG tablet TAKE 1 TABLET BY MOUTH TWICE A DAY WITH MEALS  . nebivolol (BYSTOLIC) 2.5 MG tablet TAKE 1 TABLET BY MOUTH EVERY DAY  . pantoprazole (PROTONIX) 40 MG tablet Take 1 tablet (40 mg total) by mouth daily. Please  call and schedule an appointment with Dr Rayann Heman for further refills  . spironolactone (ALDACTONE) 25 MG tablet TAKE 1 TABLET BY MOUTH EVERY DAY  . traMADol (ULTRAM) 50 MG tablet Take 50 mg by mouth as needed for moderate pain.  Marland Kitchen XARELTO 20 MG TABS tablet TAKE 1 TABLET BY MOUTH EVERY DAY WITH SUPPER   No facility-administered encounter medications on file as of 01/14/2021.    Reviewed chart prior to disease state call. Spoke with patient regarding BP  Recent Office Vitals: BP Readings from Last 3 Encounters:  12/24/20 110/68  12/08/20 (!) 110/56  09/12/20 110/78   Pulse Readings from Last 3 Encounters:  12/24/20 65  12/08/20 69  09/12/20 65    Wt Readings from Last 3 Encounters:  12/30/20 235 lb 10.8 oz (106.9 kg)  12/24/20 238 lb 11.2 oz (108.3 kg)  12/18/20 238 lb 1.6 oz (108 kg)     Kidney Function Lab Results  Component Value Date/Time   CREATININE 1.25 (H) 07/10/2020 04:53 PM   CREATININE 1.14 (H) 01/01/2020 11:43 AM   CREATININE 1.06 (H) 08/19/2015 12:44 PM   CREATININE 1.46 (H) 04/05/2014 03:33 PM   GFR 62.33 02/03/2017 12:07 PM   GFRNONAA 42 (L) 07/10/2020 04:53 PM   GFRAA 49 (L) 07/10/2020 04:53 PM    BMP Latest Ref Rng & Units 07/10/2020 01/01/2020 08/16/2019  Glucose 65 - 99 mg/dL 109(H) 166(H) -  BUN 8 - 27 mg/dL 25 21 -  Creatinine 0.57 - 1.00 mg/dL  1.25(H) 1.14(H) 1.20(H)  BUN/Creat Ratio 12 - 28 20 18  -  Sodium 134 - 144 mmol/L 142 140 -  Potassium 3.5 - 5.2 mmol/L 4.2 4.4 -  Chloride 96 - 106 mmol/L 105 103 -  CO2 20 - 29 mmol/L 21 22 -  Calcium 8.7 - 10.3 mg/dL 9.6 9.8 -    . Current antihypertensive regimen:   Losartan 25 mg 1 tablet twice daily  Spironolactone 25 mg 1 tablet daily  Furosemide 20 mg 1 tablet twice daily  . How often are you checking your Blood Pressure? Patient is not checking at home, but [facility] is [facility]   . Current home BP readings: None provided at this time.  . What recent interventions/DTPs  have been made by any provider to improve Blood Pressure control since last CPP Visit:  - Recent interventions was to check BP once a week, document, and provide at future appointments ? Ensure daily salt intake < 2300 mg/day  . Any recent hospitalizations or ED visits since last visit with CPP? No   . What diet changes have been made to improve Blood Pressure Control?  o Patient reports cutting back on salt in all foods.  . What exercise is being done to improve your Blood Pressure Control?  o Patient reports exercise at pulmonary rehab for an hour, twice a week.   Adherence Review: Is the patient currently on ACE/ARB medication? Yes   Does the patient have >5 day gap between last estimated fill dates? Yes   Star Rating Drugs: Atorvastatin 40 MG tablet: 90 DS, last filled on 12/11/20 at [facility]. Losartan 25 MG tablet: 90 DS, last filled on 11/27/20 at [facility].   Note: The patient reports she would like to follow up with her PCP to discuss her Metformin 500 MG tablet medication. The patient stated she is experiencing severe diarrhea and has been going on for about a month. The patient voiced her diarrhea has been ongoing since she first started taking the Metformin. The patient verbalized she would like to discuss different alternatives she can take other than Metformin, preferably an injection.   [name], CPP Notified.  Raynelle Highland, [place] Pharmacist Assistant [phone] CCM Total Time:44 minutes

## 2021-01-15 ENCOUNTER — Other Ambulatory Visit: Payer: Self-pay

## 2021-01-15 ENCOUNTER — Encounter (HOSPITAL_COMMUNITY)
Admission: RE | Admit: 2021-01-15 | Discharge: 2021-01-15 | Disposition: A | Discharge status: Home / Self Care | Payer: Medicare Other | Source: Ambulatory Visit | Attending: Internal Medicine | Admitting: Internal Medicine

## 2021-01-15 DIAGNOSIS — I5022 Chronic systolic (congestive) heart failure: Secondary | ICD-10-CM

## 2021-01-15 NOTE — Telephone Encounter (Signed)
Called patient to follow up on discussion yesterday about patient stopping metformin. Patient does not wish to try extended release and prefers something else.  Discussed other options such as injectable GLP1 and SGLT2 inhibitors and benefits of each. Will discuss with PCP about starting SGTL2 given CKD and HF benefits.

## 2021-01-15 NOTE — Progress Notes (Signed)
Daily Session Note  Patient Details  Name: Cynthia Herrera MRN: [id number] Date of Birth: Mar 01, 1946 Referring Provider:   [name] Pulmonary Rehab Walk Test from 12/08/2020 in [place]  Referring Provider Dr. Chase Caller      Encounter Date: 01/15/2021  Check In:  Session Check In - 01/15/21 1028      Check-In   Supervising physician immediately available to respond to emergencies [facility] immediately available    Physician(s) Dr. Tawanna Solo    [facility]    Staff Present Rosebud Poles, RN, BSN;Lisa Ysidro Evert, RN;Jessica Hassell Done, MS, ACSM-CEP, Exercise Physiologist    Virtual Visit No    Medication changes reported     No    Fall or balance concerns reported    No    Tobacco Cessation No Change    Warm-up and Cool-down Performed on first and last piece of equipment    Resistance Training Performed Yes    VAD Patient? No    PAD/SET Patient? No      Pain Assessment   Currently in Pain? No/denies    Multiple Pain Sites No           Capillary Blood Glucose: No results found for this or any previous visit (from the past 24 hour(s)).    Social History   Tobacco Use  Smoking Status Former Smoker  . Packs/day: 1.50  . Years: 35.00  . Pack years: 52.50  . Types: Cigarettes  . Quit date: 08/24/2001  . Years since quitting: 19.4  Smokeless Tobacco Never Used    Goals Met:  Proper associated with RPD/PD & O2 Sat Exercise tolerated well Strength training completed today  Goals Unmet:  Not Applicable  Comments: Service time is from 1018 to 1125    Dr. [name] is Medical Director for Cardiac Rehab at [facility].

## 2021-01-15 NOTE — Progress Notes (Signed)
I have reviewed a Home Exercise Prescription with Cynthia Herrera . Cynthia Herrera is not currently exercising at home. The patient has a stationary bike at home and was encouraged to use that at least 2-3 additional days per week. She is a fall risk and I am not comfortable with her walking without a Rolator and she does not use one at home. The patient was advised to ride her stationary bike and use resistance bands 2-3 additional days a week for 15-30 minutes.  Cynthia Herrera and I discussed how to progress their exercise prescription. The patient stated that their goals were to lose weight and breathe better.  The patient stated that they understand the exercise prescription.  We reviewed exercise guidelines, target heart rate during exercise, RPE Scale, weather conditions, use of pulse oximeter, endpoints for exercise, warmup and cool down.  Patient is encouraged to come to me with any questions. I will continue to follow up with the patient to assist them with progression and safety.   Rick Duff MS, ACSM CEP

## 2021-01-20 ENCOUNTER — Other Ambulatory Visit: Payer: Self-pay

## 2021-01-20 ENCOUNTER — Encounter (HOSPITAL_COMMUNITY)
Admission: RE | Admit: 2021-01-20 | Discharge: 2021-01-20 | Disposition: A | Discharge status: Home / Self Care | Payer: Medicare Other | Source: Ambulatory Visit | Attending: Internal Medicine | Admitting: Internal Medicine

## 2021-01-20 DIAGNOSIS — I5022 Chronic systolic (congestive) heart failure: Secondary | ICD-10-CM

## 2021-01-20 NOTE — Progress Notes (Signed)
Daily Session Note  Patient Details  Name: Cynthia Herrera MRN: [id number] Date of Birth: Feb 20, 1946 Referring Provider:   [name] Pulmonary Rehab Walk Test from 12/08/2020 in [place]  Referring Provider Dr. Chase Caller      Encounter Date: 01/20/2021  Check In:  Session Check In - 01/20/21 1102      Check-In   Supervising physician immediately available to respond to emergencies [facility] immediately available    Physician(s) Dr. Tawanna Solo    [facility]    Staff Present Rosebud Poles, RN, BSN;Lisa Ysidro Evert, RN;Jessica Hassell Done, MS, ACSM-CEP, Exercise Physiologist    Virtual Visit No    Medication changes reported     No    Fall or balance concerns reported    No    Tobacco Cessation No Change    Warm-up and Cool-down Performed on first and last piece of equipment    Resistance Training Performed Yes    VAD Patient? No    PAD/SET Patient? No      Pain Assessment   Currently in Pain? No/denies    Multiple Pain Sites No           Capillary Blood Glucose: No results found for this or any previous visit (from the past 24 hour(s)).    Social History   Tobacco Use  Smoking Status Former Smoker  . Packs/day: 1.50  . Years: 35.00  . Pack years: 52.50  . Types: Cigarettes  . Quit date: 08/24/2001  . Years since quitting: 19.4  Smokeless Tobacco Never Used    Goals Met:  Proper associated with RPD/PD & O2 Sat Exercise tolerated well Strength training completed today  Goals Unmet:  Not Applicable  Comments: Service time is from 1025 to 1135    Dr. [name] is Medical Director for Cardiac Rehab at [facility].

## 2021-01-20 NOTE — Progress Notes (Signed)
Pulmonary Individual Treatment Plan  Patient Details  [name] MRN: [id number] Date of Birth: 1946-10-07 Referring Provider:   [name] Pulmonary Rehab Walk Test from 12/08/2020 in [place]  Referring Provider Dr. Chase Caller      Initial Encounter Date:  Flowsheet Row Pulmonary Rehab Walk Test from 12/08/2020 in [facility]  Date 12/08/20      Visit Diagnosis: Heart failure, chronic systolic (HCC)  Patient's Home Medications on Admission:   Current Outpatient Medications:  .  amoxicillin (AMOXIL) 500 MG capsule, Take 2,000 mg by mouth See admin instructions. Take 2000 mg by mouth 1 hour prior to dental appointment, Disp: , Rfl: 5 .  atorvastatin (LIPITOR) 40 MG tablet, TAKE 1 TABLET BY MOUTH EVERYDAY AT BEDTIME, Disp: 90 tablet, Rfl: 2 .  Cholecalciferol (VITAMIN D3) 2000 units capsule, Take 2,000 Units by mouth daily., Disp: , Rfl:  .  clotrimazole-betamethasone (LOTRISONE) cream, Apply 1 application topically 2 (two) times daily., Disp: 30 g, Rfl: 0 .  fexofenadine (ALLEGRA) 180 MG tablet, Take 180 mg by mouth daily as needed for allergies or rhinitis., Disp: , Rfl:  .  fluconazole (DIFLUCAN) 150 MG tablet, TAKE 1 TABLET BY MOUTH ONE TIME PER WEEK, Disp: 6 tablet, Rfl: 2 .  fluticasone (FLONASE) 50 MCG/ACT nasal spray, Place 1 spray into both nostrils daily as needed for allergies. , Disp: , Rfl:  .  furosemide (LASIX) 20 MG tablet, TAKE 1 TABLET BY MOUTH TWICE A DAY, Disp: 180 tablet, Rfl: 1 .  gabapentin (NEURONTIN) 300 MG capsule, Take 2 capsules (600 mg total) by mouth at bedtime., Disp: 180 capsule, Rfl: 1 .  glucose blood test strip, Use to check CBG AC and QHS., Disp: 200 each, Rfl: 12 .  KLOR-CON M20 20 MEQ tablet, TAKE 1 TABLET BY MOUTH EVERY DAY, Disp: 90 tablet, Rfl: 1 .  letrozole (FEMARA) 2.5 MG tablet, Take 1 tablet (2.5 mg total) by mouth daily., Disp: 90 tablet, Rfl: 3 .  losartan  (COZAAR) 25 MG tablet, TAKE 1 TABLET BY MOUTH TWICE A DAY, Disp: 180 tablet, Rfl: 3 .  metFORMIN (GLUCOPHAGE) 500 MG tablet, TAKE 1 TABLET BY MOUTH TWICE A DAY WITH MEALS, Disp: 180 tablet, Rfl: 0 .  nebivolol (BYSTOLIC) 2.5 MG tablet, TAKE 1 TABLET BY MOUTH EVERY DAY, Disp: 90 tablet, Rfl: 1 .  pantoprazole (PROTONIX) 40 MG tablet, Take 1 tablet (40 mg total) by mouth daily. Please call and schedule an appointment with Dr Rayann Heman for further refills, Disp: 90 tablet, Rfl: 0 .  spironolactone (ALDACTONE) 25 MG tablet, TAKE 1 TABLET BY MOUTH EVERY DAY, Disp: 90 tablet, Rfl: 1 .  traMADol (ULTRAM) 50 MG tablet, Take 50 mg by mouth as needed for moderate pain., Disp: , Rfl:  .  XARELTO 20 MG TABS tablet, TAKE 1 TABLET BY MOUTH EVERY DAY WITH SUPPER, Disp: 30 tablet, Rfl: 10  Past Medical History: Past Medical History:  Diagnosis Date  . Arthritis   . Breast cancer, left breast ([place]) 2002   "then treated w/chemo and radiation"  . Breast cancer, left breast ([place]) 07/14/2017   "tx'd w/mastectomy"  . Chronic back pain    h/o lumbar stenosis; "no problem since my OR" (`10/27/2017)  . Chronic systolic CHF (congestive heart failure) ([place])   . Complication of anesthesia    last surgery iv med when going to sleep"burned as injected"  . COPD (chronic obstructive pulmonary disease) ([place])  no inhalers   . DCM (dilated cardiomyopathy) ([place])    EF 15-20% ? tachycardia induced - EF 35-40% by echo 05/2018  . Dyspnea    with exertion  . Gallstones   . GERD (gastroesophageal reflux disease)    once in a while;depends on what she eats  . History of bronchitis    "chronic when I smoked; no problem since I quit in 2002" (10/27/2017)  . Hyperlipidemia   . Hypertension    takes Losartan and Metoprolol.   . Joint pain   . Muscle spasm    takes Flexeril daily as needed   . Nocturia   . OSA (obstructive sleep apnea) 08/22/2018   Severe obstructive sleep apnea with an AHI 47.3/h and no significant central  sleep apnea with moderate oxygen desaturations as low as 79%  . Osteopenia   . Persistent atrial fibrillation ([place])    s/p TEE DCCV and repeat DCCV 11/14/2013 and 02/2018  . Personal history of colonic polyps    adenomas 03 and 08  . Pneumonia    hx  . Seasonal allergies   . Thyroid nodule   . Type II diabetes mellitus ([place])   . Vitamin D deficiency    takes Vit D    Tobacco Use: Social History   Tobacco Use  Smoking Status Former Smoker  . Packs/day: 1.50  . Years: 35.00  . Pack years: 52.50  . Types: Cigarettes  . Quit date: 08/24/2001  . Years since quitting: 19.4  Smokeless Tobacco Never Used    Labs: Recent Chemical engineer    Labs for ITP Cardiac and Pulmonary Rehab Latest Ref Rng & Units 07/18/2019 11/15/2019 01/01/2020 02/12/2020 12/24/2020   Cholestrol 100 - 199 mg/dL - - 140 - -   LDLCALC 0 - 99 mg/dL - - 70 - -   HDL >39 mg/dL - - 43 - -   Trlycerides 0 - 149 mg/dL - - 159(H) - -   Hemoglobin A1c 4.0 - 5.6 % 6.3(A) 7.4(A) - 6.7(A) 6.7(A)   PHART 7.350 - 7.450 - - - - -   PCO2ART 35.0 - 45.0 mmHg - - - - -   HCO3 20.0 - 24.0 mEq/L - - - - -   TCO2 0 - 100 mmol/L - - - - -   ACIDBASEDEF 0.0 - 2.0 mmol/L - - - - -   O2SAT % - - - - -      Capillary Blood Glucose: Lab Results  Component Value Date   GLUCAP 139 (H) 12/12/2018   GLUCAP 137 (H) 12/12/2018   GLUCAP 117 (H) 03/24/2018   GLUCAP 91 03/22/2018   GLUCAP 125 (H) 03/22/2018    POCT Glucose    Row Herrera 12/16/20 1159             POCT Blood Glucose   Pre-Exercise 225 mg/dL       Post-Exercise 144 mg/dL              Pulmonary Assessment Scores:  Pulmonary Assessment Scores    Row Herrera 12/08/20 1130 12/08/20 1227       ADL UCSD   ADL Phase -- Entry    SOB Score total -- 49         CAT Score   CAT Score -- 16         mMRC Score   mMRC Score 3 3          UCSD: Self-administered rating of dyspnea associated with  activities of daily living (ADLs) 6-point scale (0 = "not at  all" to 5 = "maximal or unable to do because of breathlessness")  Scoring Scores range from 0 to 120.  Minimally important difference is 5 units  CAT: CAT can identify the health impairment of COPD patients and is better correlated with disease progression.  CAT has a scoring range of zero to 40. The CAT score is classified into four groups of low (less than 10), medium (10 - 20), high (21-30) and very high (31-40) based on the impact level of disease on health status. A CAT score over 10 suggests significant symptoms.  A worsening CAT score could be explained by an exacerbation, poor medication adherence, poor inhaler technique, or progression of COPD or comorbid conditions.  CAT MCID is 2 points  mMRC: mMRC (Modified Medical Research Council) Dyspnea Scale is used to assess the degree of baseline functional disability in patients of respiratory disease due to dyspnea. No minimal important difference is established. A decrease in score of 1 point or greater is considered a positive change.   Pulmonary Function Assessment:  Pulmonary Function Assessment - 12/08/20 1112      Breath   Bilateral Breath Sounds Clear    Shortness of Breath Yes;Limiting activity           Exercise Target Goals: Exercise Program Goal: Individual exercise prescription set using results from initial 6 min walk test and THRR while considering  patient's activity barriers and safety.   Exercise Prescription Goal: Initial exercise prescription builds to 30-45 minutes a day of aerobic activity, 2-3 days per week.  Home exercise guidelines will be given to patient during program as part of exercise prescription that the participant will acknowledge.  Activity Barriers & Risk Stratification:  Activity Barriers & Cardiac Risk Stratification - 12/08/20 1109      Activity Barriers & Cardiac Risk Stratification   Activity Barriers Arthritis;Back Problems;Left Knee Replacement;Right Knee Replacement;Joint  Problems;Deconditioning;Muscular Weakness           6 Minute Walk:  6 Minute Walk    Row Herrera 12/08/20 1228         6 Minute Walk   Phase Initial     Distance 869 feet     Walk Time 6 minutes     # of Rest Breaks 1     MPH 1.64     METS 1.25     RPE 12     Perceived Dyspnea  2     VO2 Peak 4.38     Symptoms Yes (comment)     Comments Back pain and shortness of breath caused her to rest x 1, she has a limp and pushing a wheelchair steadied her     Resting HR 68 bpm     Resting BP 102/60     Resting Oxygen Saturation  95 %     Exercise Oxygen Saturation  during 6 min walk 88 %     Max Ex. HR 94 bpm     Max Ex. BP 126/70     2 Minute Post BP 110/62           Interval HR   1 Minute HR 72     2 Minute HR 93     3 Minute HR 87     4 Minute HR 93     5 Minute HR 94     2 Minute Post HR 65     Interval Heart Rate? Yes  Interval Oxygen   Interval Oxygen? Yes     Baseline Oxygen Saturation % 95 %     1 Minute Oxygen Saturation % 88 %     1 Minute Liters of Oxygen 0 L     2 Minute Oxygen Saturation % 95 %     2 Minute Liters of Oxygen 0 L     3 Minute Oxygen Saturation % 93 %     3 Minute Liters of Oxygen 0 L     4 Minute Oxygen Saturation % 98 %     4 Minute Liters of Oxygen 0 L     5 Minute Oxygen Saturation % 96 %     5 Minute Liters of Oxygen 0 L     6 Minute Liters of Oxygen 0 L     2 Minute Post Oxygen Saturation % 98 %     2 Minute Post Liters of Oxygen 0 L            Oxygen Initial Assessment:  Oxygen Initial Assessment - 12/08/20 1534      Home Oxygen   Home Oxygen Device None    Sleep Oxygen Prescription None    Home Exercise Oxygen Prescription None    Home Resting Oxygen Prescription None      Initial 6 min Walk   Oxygen Used None      Program Oxygen Prescription   Program Oxygen Prescription None      Intervention   Short Term Goals To learn and understand importance of monitoring SPO2 with pulse oximeter and demonstrate  accurate use of the pulse oximeter.;To learn and understand importance of maintaining oxygen saturations>88%;To learn and demonstrate proper pursed lip breathing techniques or other breathing techniques.;To learn and demonstrate proper use of respiratory medications    Long  Term Goals Verbalizes importance of monitoring SPO2 with pulse oximeter and return demonstration;Maintenance of O2 saturations>88%;Exhibits proper breathing techniques, such as pursed lip breathing or other method taught during program session;Compliance with respiratory medication;Demonstrates proper use of MDI's           Oxygen Re-Evaluation:  Oxygen Re-Evaluation    Row Herrera 12/22/20 0940 01/20/21 0823           Program Oxygen Prescription   Program Oxygen Prescription None None             Home Oxygen   Home Oxygen Device None None      Sleep Oxygen Prescription None None      Home Exercise Oxygen Prescription None None      Home Resting Oxygen Prescription None None             Goals/Expected Outcomes   Short Term Goals To learn and understand importance of monitoring SPO2 with pulse oximeter and demonstrate accurate use of the pulse oximeter.;To learn and understand importance of maintaining oxygen saturations>88%;To learn and demonstrate proper pursed lip breathing techniques or other breathing techniques.;To learn and demonstrate proper use of respiratory medications To learn and understand importance of monitoring SPO2 with pulse oximeter and demonstrate accurate use of the pulse oximeter.;To learn and understand importance of maintaining oxygen saturations>88%;To learn and demonstrate proper pursed lip breathing techniques or other breathing techniques.;To learn and demonstrate proper use of respiratory medications      Long  Term Goals Verbalizes importance of monitoring SPO2 with pulse oximeter and return demonstration;Maintenance of O2 saturations>88%;Exhibits proper breathing techniques, such as  pursed lip breathing or other method taught during program session;Compliance with respiratory  medication;Demonstrates proper use of MDI's Verbalizes importance of monitoring SPO2 with pulse oximeter and return demonstration;Maintenance of O2 saturations>88%;Exhibits proper breathing techniques, such as pursed lip breathing or other method taught during program session;Compliance with respiratory medication;Demonstrates proper use of MDI's      Comments Pt needs cues for pursed lip breathing, --      Goals/Expected Outcomes Compliance and understanding of oxygen saturation and pursed lip breathing. Compliance and understanding of oxygen saturation and pursed lip breathing.             Oxygen Discharge (Final Oxygen Re-Evaluation):  Oxygen Re-Evaluation - 01/20/21 0823      Program Oxygen Prescription   Program Oxygen Prescription None      Home Oxygen   Home Oxygen Device None    Sleep Oxygen Prescription None    Home Exercise Oxygen Prescription None    Home Resting Oxygen Prescription None      Goals/Expected Outcomes   Short Term Goals To learn and understand importance of monitoring SPO2 with pulse oximeter and demonstrate accurate use of the pulse oximeter.;To learn and understand importance of maintaining oxygen saturations>88%;To learn and demonstrate proper pursed lip breathing techniques or other breathing techniques.;To learn and demonstrate proper use of respiratory medications    Long  Term Goals Verbalizes importance of monitoring SPO2 with pulse oximeter and return demonstration;Maintenance of O2 saturations>88%;Exhibits proper breathing techniques, such as pursed lip breathing or other method taught during program session;Compliance with respiratory medication;Demonstrates proper use of MDI's    Goals/Expected Outcomes Compliance and understanding of oxygen saturation and pursed lip breathing.           Initial Exercise Prescription:  Initial Exercise Prescription -  12/09/20 1500      Date of Initial Exercise RX and Referring Provider   Date 12/08/20    Referring Provider Dr. Chase Caller    Expected Discharge Date 02/12/21      NuStep   Level 1    SPM 70    Minutes 30      Prescription Details   Frequency (times per week) 2    Duration Progress to 45 minutes of aerobic exercise without signs/symptoms of physical distress      Intensity   THRR 40-80% of Max Heartrate 58-117    Ratings of Perceived Exertion 11-13    Perceived Dyspnea 0-4      Progression   Progression Continue to progress workloads to maintain intensity without signs/symptoms of physical distress.      Resistance Training   Training Prescription Yes    Weight orange bands    Reps 10-15           Perform Capillary Blood Glucose checks as needed.  Exercise Prescription Changes:  Exercise Prescription Changes    Row Herrera 12/16/20 1200 12/22/20 0900 12/30/20 1200 01/08/21 1127 01/15/21 1200     Response to Exercise   Blood Pressure (Admit) 122/60 -- 124/70 100/60 --   Blood Pressure (Exercise) 134/66 -- 136/70 -- --   Blood Pressure (Exit) 103/66 -- 108/60 98/60 --   Heart Rate (Admit) 68 bpm -- 77 bpm 72 bpm --   Heart Rate (Exercise) 95 bpm -- 75 bpm 102 bpm --   Heart Rate (Exit) 73 bpm -- 70 bpm 74 bpm --   Oxygen Saturation (Admit) 97 % -- 97 % 99 % --   Oxygen Saturation (Exercise) 94 % -- 95 % 94 % --   Oxygen Saturation (Exit) 99 % -- 95 % 93 % --  Rating of Perceived Exertion (Exercise) 13 -- 9 11 --   Perceived Dyspnea (Exercise) 3 -- 1 1 --   Duration Continue with 30 min of aerobic exercise without signs/symptoms of physical distress. -- Continue with 30 min of aerobic exercise without signs/symptoms of physical distress. Continue with 30 min of aerobic exercise without signs/symptoms of physical distress. --   Intensity --  40-80% HRR -- THRR unchanged THRR unchanged --     Progression   Average METs -- 2.05 -- -- --     Resistance Training    Training Prescription Yes -- Yes Yes --   Weight orange bands -- orange bands orange bands --   Reps 10-15 -- 10-15 10-15 --   Time 10 Minutes -- 10 Minutes 10 Minutes --     NuStep   Level 1 -- 2 3 --   SPM 80 -- 80 80 --   Minutes 15 -- 30 15 --   METs 1.6 -- 1.7 2.1 --     Track   Laps 9 -- -- 8 --   Minutes 15 -- -- 15 --     Home Exercise Plan   Plans to continue exercise at -- -- -- -- Home (comment)  Stationary bike and resistance training   Frequency -- -- -- -- Add 2 additional days to program exercise sessions.   Initial Home Exercises Provided -- -- -- -- 01/15/21          Exercise Comments:  Exercise Comments    Row Herrera 12/16/20 1232 01/15/21 1529         Exercise Comments Pt completed first day of exercise and tolerated fair with minor complaints of chronic bilateral knee pain. She was able to do the Nustep for 20 minutes without having to rest and also walked the track for 10 minutes using a rollator. She completed resistance training with no mobility issues. Completed home exercise with pt. Pt is not currently doing any exercise outside of rehab. Discussed adding at least 2 additional days of exercise at home. She has a stationary bike at home, encouraged use of that and also resistance bands. Pt was receptive. Will continue to follow on home exercise prescription.             Exercise Goals and Review:  Exercise Goals    Row Herrera 12/08/20 1533             Exercise Goals   Increase Physical Activity Yes       Intervention Provide advice, education, support and counseling about physical activity/exercise needs.;Develop an individualized exercise prescription for aerobic and resistive training based on initial evaluation findings, risk stratification, comorbidities and participant's personal goals.       Expected Outcomes Short Term: Attend rehab on a regular basis to increase amount of physical activity.;Long Term: Add in home exercise to make exercise  part of routine and to increase amount of physical activity.;Long Term: Exercising regularly at least 3-5 days a week.       Increase Strength and Stamina Yes       Intervention Provide advice, education, support and counseling about physical activity/exercise needs.;Develop an individualized exercise prescription for aerobic and resistive training based on initial evaluation findings, risk stratification, comorbidities and participant's personal goals.       Expected Outcomes Short Term: Increase workloads from initial exercise prescription for resistance, speed, and METs.;Short Term: Perform resistance training exercises routinely during rehab and add in resistance training at home;Long Term: Improve  cardiorespiratory fitness, muscular endurance and strength as measured by increased METs and functional capacity (6MWT)       Able to understand and use rate of perceived exertion (RPE) scale Yes       Intervention Provide education and explanation on how to use RPE scale       Expected Outcomes Short Term: Able to use RPE daily in rehab to express subjective intensity level;Long Term:  Able to use RPE to guide intensity level when exercising independently       Able to understand and use Dyspnea scale Yes       Intervention Provide education and explanation on how to use Dyspnea scale       Expected Outcomes Short Term: Able to use Dyspnea scale daily in rehab to express subjective sense of shortness of breath during exertion;Long Term: Able to use Dyspnea scale to guide intensity level when exercising independently       Knowledge and understanding of Target Heart Rate Range (THRR) Yes       Intervention Provide education and explanation of THRR including how the numbers were predicted and where they are located for reference       Expected Outcomes Short Term: Able to state/look up THRR;Long Term: Able to use THRR to govern intensity when exercising independently;Short Term: Able to use daily as  guideline for intensity in rehab       Understanding of Exercise Prescription Yes       Intervention Provide education, explanation, and written materials on patient's individual exercise prescription       Expected Outcomes Short Term: Able to explain program exercise prescription;Long Term: Able to explain home exercise prescription to exercise independently              Exercise Goals Re-Evaluation :  Exercise Goals Re-Evaluation    Row Herrera 12/22/20 0936 01/20/21 0811 01/20/21 0820         Exercise Goal Re-Evaluation   Exercise Goals Review Increase Physical Activity;Increase Strength and Stamina;Able to understand and use rate of perceived exertion (RPE) scale;Able to understand and use Dyspnea scale;Knowledge and understanding of Target Heart Rate Range (THRR);Understanding of Exercise Prescription Increase Physical Activity;Increase Strength and Stamina;Able to understand and use rate of perceived exertion (RPE) scale;Able to understand and use Dyspnea scale;Knowledge and understanding of Target Heart Rate Range (THRR);Understanding of Exercise Prescription (P)  Increase Physical Activity;Increase Strength and Stamina;Able to understand and use rate of perceived exertion (RPE) scale;Able to understand and use Dyspnea scale;Knowledge and understanding of Target Heart Rate Range (THRR);Understanding of Exercise Prescription     Comments Pt has completed 2 exercise sessions and is learning to become independent with exercise. She does the Nustep for 20 minutes and walks for 10 minutes using a rolator. She has tolerated exercise well so far. It is too early to see any progression, but we will continue to monitor and progress as she is able. She is currently exercising at 1.7 METS on the Nustep and 2.05 METS walking the track. -- Pt has completed 9 exercise sessions and has tolerated well so far. She has become independent with all resistance band exercises and stretches. She has been a little  slow to make progressions with MET and workload increases but has made minimal increases. She is currently exercising at 1.9 METS on level 3 on the Nustep and 1.81 METs walking the track. Will continue to monitor and progress as she is able.     Expected Outcomes Through exercise at  rehab and home the patient will decrease shortness of breath with daily activities and feel confident in carrying out an exercise regimn at home. Through exercise at rehab and home the patient will decrease shortness of breath with daily activities and feel confident in carrying out an exercise regimn at home. (P)  Through exercise at rehab and home the patient will decrease shortness of breath with daily activities and feel confident in carrying out an exercise regimn at home.            Discharge Exercise Prescription (Final Exercise Prescription Changes):  Exercise Prescription Changes - 01/15/21 1200      Home Exercise Plan   Plans to continue exercise at Home (comment)   Stationary bike and resistance training   Frequency Add 2 additional days to program exercise sessions.    Initial Home Exercises Provided 01/15/21           Nutrition:  Target Goals: Understanding of nutrition guidelines, daily intake of sodium <1543m, cholesterol <2062m calories 30% from fat and 7% or less from saturated fats, daily to have 5 or more servings of fruits and vegetables.  Biometrics:  Pre Biometrics - 12/08/20 1110      Pre Biometrics   Height _0  (1.676 m)    Weight 107.1 kg    BMI (Calculated) 38.13    Grip Strength 18 kg            Nutrition Therapy Plan and Nutrition Goals:  Nutrition Therapy & Goals - 12/30/20 1151      Nutrition Therapy   Diet TLC; carb modified      Personal Nutrition Goals   Nutrition Goal Pt to record daily weights    Personal Goal #2 Pt to identify and limit food sources of saturated fat, trans fat, refined carbohydrates and sodium    Personal Goal #3 Pt to identify food  quantities necessary to achieve weight loss of 6-24 lb at graduation from cardiac rehab      InNicuteducate and counsel regarding individualized specific dietary modifications aiming towards targeted core components such as weight, hypertension, lipid management, diabetes, heart failure and other comorbidities.    Expected Outcomes Short Term Goal: Understand basic principles of dietary content, such as calories, fat, sodium, cholesterol and nutrients.           Nutrition Assessments:  MEDIFICTS Score Key:  ?70 Need to make dietary changes   40-70 Heart Healthy Diet  ? 40 Therapeutic Level Cholesterol Diet  Flowsheet Row PULMONARY REHAB OTHER RESPIRATORY [facility] Total Score on [facility] 47     Picture Your Plate Scores:  <4<12nhealthy dietary pattern with much room for improvement.  41-50 Dietary pattern unlikely to meet recommendations for good health and room for improvement.  51-60 More healthful dietary pattern, with some room for improvement.   >60 Healthy dietary pattern, although there may be some specific behaviors that could be improved.    Nutrition Goals Re-Evaluation:  Nutrition Goals Re-Evaluation    [place] 12/30/20 1152 01/14/21 1459           Goals   Current Weight 238 lb (108 kg) 235 lb 0.2 oz (106.6 kg)      Nutrition Goal Pt to record daily weights Pt to record daily weights      Comment -- Weight down 3 lbs  Personal Goal #2 Re-Evaluation   Personal Goal #2 Pt to identify and limit food sources of saturated fat, trans fat, refined carbohydrates and sodium Pt to identify and limit food sources of saturated fat, trans fat, refined carbohydrates and sodium             Personal Goal #3 Re-Evaluation   Personal Goal #3 Pt to identify food quantities necessary to achieve weight loss of 6-24 lb at graduation from cardiac rehab Pt to  identify food quantities necessary to achieve weight loss of 6-24 lb at graduation from cardiac rehab             Nutrition Goals Discharge (Final Nutrition Goals Re-Evaluation):  Nutrition Goals Re-Evaluation - 01/14/21 1459      Goals   Current Weight 235 lb 0.2 oz (106.6 kg)    Nutrition Goal Pt to record daily weights    Comment Weight down 3 lbs      Personal Goal #2 Re-Evaluation   Personal Goal #2 Pt to identify and limit food sources of saturated fat, trans fat, refined carbohydrates and sodium      Personal Goal #3 Re-Evaluation   Personal Goal #3 Pt to identify food quantities necessary to achieve weight loss of 6-24 lb at graduation from cardiac rehab           Psychosocial: Target Goals: Acknowledge presence or absence of significant depression and/or stress, maximize coping skills, provide positive support system. Participant is able to verbalize types and ability to use techniques and skills needed for reducing stress and depression.  Initial Review & Psychosocial Screening:  Initial Psych Review & Screening - 12/08/20 1121      Initial Review   Current issues with None Identified      Family Dynamics   Good Support System? Yes   son and daughter   Comments No concerns identified      Barriers   Psychosocial barriers to participate in program There are no identifiable barriers or psychosocial needs.      Screening Interventions   Interventions Encouraged to exercise           Quality of Life Scores:  Scores of 19 and below usually indicate a poorer quality of life in these areas.  A difference of  2-3 points is a clinically meaningful difference.  A difference of 2-3 points in the total score of the Quality of Life Index has been associated with significant improvement in overall quality of life, self-image, physical symptoms, and general health in studies assessing change in quality of life.  PHQ-9: Recent Review Flowsheet Data    Depression screen  [facility] 2/9 12/08/2020 02/12/2020 01/02/2019 05/22/2018   Decreased Interest 0 0 0 0   Down, Depressed, Hopeless 0 0 0 0   PHQ - 2 Score 0 0 0 0   Altered sleeping 0 0 - -   Tired, decreased energy 0 1 - -   Change in appetite 0 0 - -   Feeling bad or failure about yourself  0 0 - -   Trouble concentrating 0 0 - -   Moving slowly or fidgety/restless 0 0 - -   Suicidal thoughts 0 0 - -   PHQ-9 Score - 1 - -   Difficult doing work/chores Not difficult at all Not difficult at all - -     Interpretation of Total Score  Total Score Depression Severity:  1-4 = Minimal depression, 5-9 = Mild depression, 10-14 = Moderate depression, 15-19 =  Moderately severe depression, 20-27 = Severe depression   Psychosocial Evaluation and Intervention:  Psychosocial Evaluation - 12/08/20 1123      Psychosocial Evaluation & Interventions   Interventions Encouraged to exercise with the program and follow exercise prescription    Continue Psychosocial Services  No Follow up required           Psychosocial Re-Evaluation:  Psychosocial Re-Evaluation    Cynthia Herrera 12/18/20 0932 01/19/21 1156           Psychosocial Re-Evaluation   Current issues with None Identified None Identified      Comments No concerns identified. No psychosocial concerns identified at this time.      Expected Outcomes To continue to have no psychosocial concerns while in pulmonary rehab. For Cynthia Herrera to continue to be free of psychosocial concerns while participating in pulmonary rehab. Her dog, Cynthia Herrera is a great comfort to [place].      Interventions Encouraged to attend Pulmonary Rehabilitation for the exercise Encouraged to attend Pulmonary Rehabilitation for the exercise      Continue Psychosocial Services  No Follow up required No Follow up required             Psychosocial Discharge (Final Psychosocial Re-Evaluation):  Psychosocial Re-Evaluation - 01/19/21 1156      Psychosocial Re-Evaluation   Current issues with None Identified     Comments No psychosocial concerns identified at this time.    Expected Outcomes For Cynthia Herrera to continue to be free of psychosocial concerns while participating in pulmonary rehab. Her dog, Cynthia Herrera is a great comfort to [place].    Interventions Encouraged to attend Pulmonary Rehabilitation for the exercise    Continue Psychosocial Services  No Follow up required           Education: Education Goals: Education classes will be provided on a weekly basis, covering required topics. Participant will state understanding/return demonstration of topics presented.  Learning Barriers/Preferences:  Learning Barriers/Preferences - 12/08/20 1124      Learning Barriers/Preferences   Learning Barriers None    Learning Preferences Written Material           Education Topics: Risk Factor Reduction:  -Group instruction that is supported by a PowerPoint presentation. Instructor discusses the definition of a risk factor, different risk factors for pulmonary disease, and how the heart and lungs work together.     Nutrition for Pulmonary Patient:  -Group instruction provided by PowerPoint slides, verbal discussion, and written materials to support subject matter. The instructor gives an explanation and review of healthy diet recommendations, which includes a discussion on weight management, recommendations for fruit and vegetable consumption, as well as protein, fluid, caffeine, fiber, sodium, sugar, and alcohol. Tips for eating when patients are short of breath are discussed. Flowsheet Row PULMONARY REHAB OTHER RESPIRATORY from 01/01/2021 in [place]  Date 12/16/20  Educator handout      Pursed Lip Breathing:  -Group instruction that is supported by demonstration and informational handouts. Instructor discusses the benefits of pursed lip and diaphragmatic breathing and detailed demonstration on how to preform both.     Oxygen Safety:  -Group instruction provided by  PowerPoint, verbal discussion, and written material to support subject matter. There is an overview of "What is Oxygen" and "Why do we need it".  Instructor also reviews how to create a safe environment for oxygen use, the importance of using oxygen as prescribed, and the risks of noncompliance. There is a brief discussion on traveling with  oxygen and resources the patient may utilize. Flowsheet Row PULMONARY REHAB OTHER RESPIRATORY from 01/01/2021 in [place]  Date 12/25/20  Educator Handout      Oxygen Equipment:  -Group instruction provided by [facility] Staff utilizing handouts, written materials, and equipment demonstrations.   Signs and Symptoms:  -Group instruction provided by written material and verbal discussion to support subject matter. Warning signs and symptoms of infection, stroke, and heart attack are reviewed and when to call the physician/911 reinforced. Tips for preventing the spread of infection discussed.   Advanced Directives:  -Group instruction provided by verbal instruction and written material to support subject matter. Instructor reviews Advanced Directive laws and proper instruction for filling out document.   Pulmonary Video:  -Group video education that reviews the importance of medication and oxygen compliance, exercise, good nutrition, pulmonary hygiene, and pursed lip and diaphragmatic breathing for the pulmonary patient.   Exercise for the Pulmonary Patient:  -Group instruction that is supported by a PowerPoint presentation. Instructor discusses benefits of exercise, core components of exercise, frequency, duration, and intensity of an exercise routine, importance of utilizing pulse oximetry during exercise, safety while exercising, and options of places to exercise outside of rehab.     Pulmonary Medications:  -Verbally interactive group education provided by instructor with focus on inhaled medications and proper  administration.   Anatomy and Physiology of the Respiratory System and Intimacy:  -Group instruction provided by PowerPoint, verbal discussion, and written material to support subject matter. Instructor reviews respiratory cycle and anatomical components of the respiratory system and their functions. Instructor also reviews differences in obstructive and restrictive respiratory diseases with examples of each. Intimacy, Sex, and Sexuality differences are reviewed with a discussion on how relationships can change when diagnosed with pulmonary disease. Common sexual concerns are reviewed. Flowsheet Row PULMONARY REHAB OTHER RESPIRATORY from 01/01/2021 in [place]  Date 01/01/21  Educator Bufford Lope  Instruction Review Code 1- Verbalizes Understanding      MD DAY -A group question and answer session with a medical doctor that allows participants to ask questions that relate to their pulmonary disease state.   OTHER EDUCATION -Group or individual verbal, written, or video instructions that support the educational goals of the pulmonary rehab program.   Holiday Eating Survival Tips:  -Group instruction provided by PowerPoint slides, verbal discussion, and written materials to support subject matter. The instructor gives patients tips, tricks, and techniques to help them not only survive but enjoy the holidays despite the onslaught of food that accompanies the holidays.   Knowledge Questionnaire Score:  Knowledge Questionnaire Score - 12/08/20 1227      Knowledge Questionnaire Score   Pre Score 10/18           Core Components/Risk Factors/Patient Goals at Admission:  Personal Goals and Risk Factors at Admission - 12/08/20 1124      Core Components/Risk Factors/Patient Goals on Admission   Improve shortness of breath with ADL's Yes    Intervention Provide education, individualized exercise plan and daily activity instruction to help decrease  symptoms of SOB with activities of daily living.    Expected Outcomes Short Term: Improve cardiorespiratory fitness to achieve a reduction of symptoms when performing ADLs;Long Term: Be able to perform more ADLs without symptoms or delay the onset of symptoms           Core Components/Risk Factors/Patient Goals Review:   Goals and Risk Factor Review    Row Herrera  12/18/20 0933 01/19/21 1159           Core Components/Risk Factors/Patient Goals Review   Personal Goals Review Heart Failure Heart Failure      Review She just started program, has exercised once with [place], for now her heart failure is stable. Cynthia Herrera's heart failure is stable, she is making slow progress with her exercise.  She is exercising @ 1.9-2.1 mets on the stepper and walking 7-8 laps on the track in 10 minutes.      Expected Outcomes See admission goals. See admission goals.             Core Components/Risk Factors/Patient Goals at Discharge (Final Review):   Goals and Risk Factor Review - 01/19/21 1159      Core Components/Risk Factors/Patient Goals Review   Personal Goals Review Heart Failure    Review Cynthia Herrera's heart failure is stable, she is making slow progress with her exercise.  She is exercising @ 1.9-2.1 mets on the stepper and walking 7-8 laps on the track in 10 minutes.    Expected Outcomes See admission goals.           ITP Comments:   Comments: ITP REVIEW Pt is making expected progress toward pulmonary rehab goals after completing 9 sessions. Recommend continued exercise, life style modification, education, and utilization of breathing techniques to increase stamina and strength and decrease shortness of breath with exertion.

## 2021-01-22 ENCOUNTER — Other Ambulatory Visit: Payer: Self-pay

## 2021-01-22 ENCOUNTER — Encounter (HOSPITAL_COMMUNITY)
Admission: RE | Admit: 2021-01-22 | Discharge: 2021-01-22 | Disposition: A | Discharge status: Home / Self Care | Payer: Medicare Other | Source: Ambulatory Visit | Attending: Internal Medicine | Admitting: Internal Medicine

## 2021-01-22 DIAGNOSIS — I5022 Chronic systolic (congestive) heart failure: Secondary | ICD-10-CM | POA: Diagnosis not present

## 2021-01-22 NOTE — Progress Notes (Signed)
Daily Session Note  Patient Details  Name: Cynthia Herrera MRN: [id number] Date of Birth: 1946-02-22 Referring Provider:   [name] Pulmonary Rehab Walk Test from 12/08/2020 in [place]  Referring Provider Dr. Chase Caller      Encounter Date: 01/22/2021  Check In:   Capillary Blood Glucose: No results found for this or any previous visit (from the past 24 hour(s)).    Social History   Tobacco Use  Smoking Status Former Smoker  . Packs/day: 1.50  . Years: 35.00  . Pack years: 52.50  . Types: Cigarettes  . Quit date: 08/24/2001  . Years since quitting: 19.4  Smokeless Tobacco Never Used    Goals Met:  Exercise tolerated well No report of cardiac concerns or symptoms Strength training completed today  Goals Unmet:  Not Applicable  Comments: Service time is from 1030 to 1130    Dr. [name] is Medical Director for Cardiac Rehab at [facility].

## 2021-01-27 ENCOUNTER — Encounter (HOSPITAL_COMMUNITY)
Admission: RE | Admit: 2021-01-27 | Discharge: 2021-01-27 | Disposition: A | Discharge status: Home / Self Care | Payer: Medicare Other | Source: Ambulatory Visit | Attending: Internal Medicine | Admitting: Internal Medicine

## 2021-01-27 ENCOUNTER — Other Ambulatory Visit: Payer: Self-pay

## 2021-01-27 VITALS — Wt 239.4 lb

## 2021-01-27 DIAGNOSIS — I5022 Chronic systolic (congestive) heart failure: Secondary | ICD-10-CM | POA: Diagnosis not present

## 2021-01-27 NOTE — Progress Notes (Signed)
Daily Session Note  Patient Details  Name: DAYLEEN BESKE MRN: [id number] Date of Birth: 26-Jun-1946 Referring Provider:   [name] Pulmonary Rehab Walk Test from 12/08/2020 in [place]  Referring Provider Dr. Chase Caller      Encounter Date: 01/27/2021  Check In:  Session Check In - 01/27/21 1120      Check-In   Supervising physician immediately available to respond to emergencies [facility] immediately available    Physician(s) Dr. Tawanna Solo    Location [facility]    Staff Present Maurice Small, RN, BSN;Skylur Fuston Ysidro Evert, RN;Jessica Hassell Done, MS, ACSM-CEP, Exercise Physiologist    Virtual Visit No    Medication changes reported     No    Fall or balance concerns reported    No    Tobacco Cessation No Change    Warm-up and Cool-down Performed on first and last piece of equipment    Resistance Training Performed Yes    VAD Patient? No    PAD/SET Patient? No      Pain Assessment   Currently in Pain? No/denies    Multiple Pain Sites No           Capillary Blood Glucose: No results found for this or any previous visit (from the past 24 hour(s)).   Exercise Prescription Changes - 01/27/21 1100      Response to Exercise   Blood Pressure (Admit) 130/70    Blood Pressure (Exercise) 130/68    Blood Pressure (Exit) 122/78    Heart Rate (Admit) 80 bpm    Heart Rate (Exercise) 80 bpm    Heart Rate (Exit) 72 bpm    Oxygen Saturation (Admit) 9895 %    Oxygen Saturation (Exercise) 96 %    Oxygen Saturation (Exit) 12 %    Rating of Perceived Exertion (Exercise) 2    Duration Continue with 30 min of aerobic exercise without signs/symptoms of physical distress.    Intensity THRR unchanged      Resistance Training   Training Prescription Yes    Weight orange bands    Reps 10-15    Time 10 Minutes      NuStep   Level 4    SPM 80    Minutes 15    METs 1.9      Track   Laps 9    Minutes 15           Social  History   Tobacco Use  Smoking Status Former Smoker  . Packs/day: 1.50  . Years: 35.00  . Pack years: 52.50  . Types: Cigarettes  . Quit date: 08/24/2001  . Years since quitting: 19.4  Smokeless Tobacco Never Used    Goals Met:  Exercise tolerated well No report of cardiac concerns or symptoms Strength training completed today  Goals Unmet:  Not Applicable  Comments: Service time is from 1020 to 1135    Dr. [name] is Medical Director for Cardiac Rehab at [facility].

## 2021-01-27 NOTE — Progress Notes (Signed)
This encounter was created in error - please disregard.

## 2021-01-28 ENCOUNTER — Encounter: Payer: Medicare Other | Admitting: Cardiology

## 2021-01-28 DIAGNOSIS — I10 Essential (primary) hypertension: Secondary | ICD-10-CM

## 2021-01-28 DIAGNOSIS — I5042 Chronic combined systolic (congestive) and diastolic (congestive) heart failure: Secondary | ICD-10-CM

## 2021-01-28 DIAGNOSIS — I420 Dilated cardiomyopathy: Secondary | ICD-10-CM

## 2021-01-28 DIAGNOSIS — G4733 Obstructive sleep apnea (adult) (pediatric): Secondary | ICD-10-CM

## 2021-01-28 DIAGNOSIS — I2510 Atherosclerotic heart disease of native coronary artery without angina pectoris: Secondary | ICD-10-CM

## 2021-01-28 DIAGNOSIS — I4819 Other persistent atrial fibrillation: Secondary | ICD-10-CM

## 2021-01-28 DIAGNOSIS — E119 Type 2 diabetes mellitus without complications: Secondary | ICD-10-CM

## 2021-01-29 ENCOUNTER — Encounter (HOSPITAL_COMMUNITY)
Admission: RE | Admit: 2021-01-29 | Discharge: 2021-01-29 | Disposition: A | Discharge status: Home / Self Care | Payer: Medicare Other | Source: Ambulatory Visit | Attending: Internal Medicine | Admitting: Internal Medicine

## 2021-01-29 ENCOUNTER — Other Ambulatory Visit: Payer: Self-pay

## 2021-01-29 DIAGNOSIS — I5022 Chronic systolic (congestive) heart failure: Secondary | ICD-10-CM | POA: Diagnosis not present

## 2021-01-29 NOTE — Progress Notes (Signed)
Daily Session Note  Patient Details  Name: Cynthia Herrera MRN: [id number] Date of Birth: 25-Apr-1946 Referring Provider:   [name] Pulmonary Rehab Walk Test from 12/08/2020 in [place]  Referring Provider Dr. Chase Caller      Encounter Date: 01/29/2021  Check In:  Session Check In - 01/29/21 1104      Check-In   Supervising physician immediately available to respond to emergencies [facility] immediately available    Physician(s) Dr. Pricilla Loveless    [facility]    Staff Present Rosebud Poles, RN, BSN;Lisa Ysidro Evert, RN;Yenni Carra Hassell Done, MS, ACSM-CEP, Exercise Physiologist    Virtual Visit No    Medication changes reported     No    Fall or balance concerns reported    No    Tobacco Cessation No Change    Warm-up and Cool-down Performed on first and last piece of equipment    Resistance Training Performed Yes    VAD Patient? No    PAD/SET Patient? No      Pain Assessment   Currently in Pain? No/denies    Multiple Pain Sites No           Capillary Blood Glucose: No results found for this or any previous visit (from the past 24 hour(s)).    Social History   Tobacco Use  Smoking Status Former Smoker  . Packs/day: 1.50  . Years: 35.00  . Pack years: 52.50  . Types: Cigarettes  . Quit date: 08/24/2001  . Years since quitting: 19.4  Smokeless Tobacco Never Used    Goals Met:  Proper associated with RPD/PD & O2 Sat Exercise tolerated well No report of cardiac concerns or symptoms Strength training completed today  Goals Unmet:  Not Applicable  Comments: Service time is from 1025 to 1130    Dr. [name] is Medical Director for Cardiac Rehab at [facility].

## 2021-02-03 ENCOUNTER — Encounter (HOSPITAL_COMMUNITY)
Admission: RE | Admit: 2021-02-03 | Discharge: 2021-02-03 | Disposition: A | Discharge status: Home / Self Care | Payer: Medicare Other | Source: Ambulatory Visit | Attending: Internal Medicine | Admitting: Internal Medicine

## 2021-02-03 ENCOUNTER — Other Ambulatory Visit: Payer: Self-pay

## 2021-02-03 DIAGNOSIS — I5022 Chronic systolic (congestive) heart failure: Secondary | ICD-10-CM

## 2021-02-03 NOTE — Progress Notes (Signed)
Daily Session Note  Patient Details  Name: Cynthia Herrera MRN: [id number] Date of Birth: May 27, 1946 Referring Provider:   [name] Pulmonary Rehab Walk Test from 12/08/2020 in [place]  Referring Provider Dr. Chase Caller      Encounter Date: 02/03/2021  Check In:  Session Check In - 02/03/21 1118      Check-In   Supervising physician immediately available to respond to emergencies [facility] immediately available    Physician(s) Dr. Tawanna Solo    [facility]    Staff Present Rosebud Poles, RN, Isaac Laud, MS, ACSM-CEP, Exercise Physiologist    Virtual Visit No    Medication changes reported     No    Fall or balance concerns reported    No    Tobacco Cessation No Change    Warm-up and Cool-down Performed on first and last piece of equipment    Resistance Training Performed Yes    VAD Patient? No    PAD/SET Patient? No      Pain Assessment   Currently in Pain? No/denies    Multiple Pain Sites No           Capillary Blood Glucose: No results found for this or any previous visit (from the past 24 hour(s)).    Social History   Tobacco Use  Smoking Status Former Smoker  . Packs/day: 1.50  . Years: 35.00  . Pack years: 52.50  . Types: Cigarettes  . Quit date: 08/24/2001  . Years since quitting: 19.4  Smokeless Tobacco Never Used    Goals Met:  Proper associated with RPD/PD & O2 Sat Exercise tolerated well No report of cardiac concerns or symptoms Strength training completed today  Goals Unmet:  Not Applicable  Comments: Service time is from 1022 to 1120    Dr. [name] is Medical Director for Cardiac Rehab at [facility].

## 2021-02-05 ENCOUNTER — Other Ambulatory Visit: Payer: Self-pay

## 2021-02-05 ENCOUNTER — Encounter (HOSPITAL_COMMUNITY)
Admission: RE | Admit: 2021-02-05 | Discharge: 2021-02-05 | Disposition: A | Discharge status: Home / Self Care | Payer: Medicare Other | Source: Ambulatory Visit | Attending: Internal Medicine | Admitting: Internal Medicine

## 2021-02-05 DIAGNOSIS — I5022 Chronic systolic (congestive) heart failure: Secondary | ICD-10-CM

## 2021-02-05 NOTE — Progress Notes (Signed)
Daily Session Note  Patient Details  Name: LEYDY WORTHEY MRN: [id number] Date of Birth: July 03, 1946 Referring Provider:   [name] Pulmonary Rehab Walk Test from 12/08/2020 in [place]  Referring Provider Dr. Chase Caller      Encounter Date: 02/05/2021  Check In:  Session Check In - 02/05/21 1101      Check-In   Supervising physician immediately available to respond to emergencies [facility] immediately available    Physician(s) Dr. Florencia Reasons    [facility]    Staff Present Rosebud Poles, RN, BSN;Lisa Ysidro Evert, RN;Jessica Hassell Done, MS, ACSM-CEP, Exercise Physiologist    Virtual Visit No    Medication changes reported     No    Fall or balance concerns reported    No    Tobacco Cessation No Change    Warm-up and Cool-down Performed on first and last piece of equipment    Resistance Training Performed Yes    VAD Patient? No    PAD/SET Patient? No      Pain Assessment   Currently in Pain? No/denies    Multiple Pain Sites No           Capillary Blood Glucose: No results found for this or any previous visit (from the past 24 hour(s)).    Social History   Tobacco Use  Smoking Status Former Smoker  . Packs/day: 1.50  . Years: 35.00  . Pack years: 52.50  . Types: Cigarettes  . Quit date: 08/24/2001  . Years since quitting: 19.4  Smokeless Tobacco Never Used    Goals Met:  Proper associated with RPD/PD & O2 Sat Exercise tolerated well Strength training completed today  Goals Unmet:  Not Applicable  Comments: Service time is from 1022 to 24   Dr. [name] is Medical Director for Cardiac Rehab at [facility].

## 2021-02-10 ENCOUNTER — Other Ambulatory Visit: Payer: Self-pay

## 2021-02-10 ENCOUNTER — Encounter (HOSPITAL_COMMUNITY)
Admission: RE | Admit: 2021-02-10 | Discharge: 2021-02-10 | Disposition: A | Discharge status: Home / Self Care | Payer: Medicare Other | Source: Ambulatory Visit | Attending: Internal Medicine | Admitting: Internal Medicine

## 2021-02-10 VITALS — Wt 237.9 lb

## 2021-02-10 DIAGNOSIS — I5022 Chronic systolic (congestive) heart failure: Secondary | ICD-10-CM | POA: Diagnosis not present

## 2021-02-10 NOTE — Progress Notes (Signed)
Daily Session Note  Patient Details  Name: Cynthia Herrera MRN: [id number] Date of Birth: [birth year] Referring Provider:   [name] Pulmonary Rehab Walk Test from 12/08/2020 in [place]  Referring Provider Dr. Chase Caller      Encounter Date: 02/10/2021  Check In:  Session Check In - 02/10/21 1106      Check-In   Supervising physician immediately available to respond to emergencies [facility] immediately available    Physician(s) Dr. Tawanna Solo    [facility]    Staff Present Rosebud Poles, RN, BSN;Selyna Klahn Ysidro Evert, RN;Jessica Hassell Done, MS, ACSM-CEP, Exercise Physiologist    Virtual Visit No    Medication changes reported     No    Fall or balance concerns reported    No    Tobacco Cessation No Change    Warm-up and Cool-down Performed on first and last piece of equipment    Resistance Training Performed Yes    VAD Patient? No    PAD/SET Patient? No      Pain Assessment   Currently in Pain? No/denies    Multiple Pain Sites No           Capillary Blood Glucose: No results found for this or any previous visit (from the past 24 hour(s)).   Exercise Prescription Changes - 02/10/21 1400      Response to Exercise   Blood Pressure (Admit) 112/70    Blood Pressure (Exercise) 112/70    Blood Pressure (Exit) 116/56    Heart Rate (Admit) 66 bpm    Heart Rate (Exercise) 74 bpm    Heart Rate (Exit) 63 bpm    Oxygen Saturation (Admit) 95 %    Oxygen Saturation (Exercise) 97 %    Oxygen Saturation (Exit) 96 %    Rating of Perceived Exertion (Exercise) 8    Perceived Dyspnea (Exercise) 1    Duration Continue with 30 min of aerobic exercise without signs/symptoms of physical distress.    Intensity THRR unchanged      Resistance Training   Training Prescription Yes    Weight orange bands    Reps 10-15    Time 10 Minutes      NuStep   Level 5    SPM 80    Minutes 30    METs 1.8           Social History    Tobacco Use  Smoking Status Former Smoker  . Packs/day: 1.50  . Years: 35.00  . Pack years: 52.50  . Types: Cigarettes  . Quit date: 08/24/2001  . Years since quitting: 19.4  Smokeless Tobacco Never Used    Goals Met:  Exercise tolerated well No report of cardiac concerns or symptoms Strength training completed today  Goals Unmet:  Not Applicable  Comments: Service time is from 1025 to 1125    Dr. [name] is Medical Director for Cardiac Rehab at [facility].

## 2021-02-12 ENCOUNTER — Encounter (HOSPITAL_COMMUNITY)
Admission: RE | Admit: 2021-02-12 | Discharge: 2021-02-12 | Disposition: A | Discharge status: Home / Self Care | Payer: Medicare Other | Source: Ambulatory Visit | Attending: Internal Medicine | Admitting: Internal Medicine

## 2021-02-12 ENCOUNTER — Other Ambulatory Visit: Payer: Self-pay

## 2021-02-12 DIAGNOSIS — I5022 Chronic systolic (congestive) heart failure: Secondary | ICD-10-CM | POA: Diagnosis not present

## 2021-02-12 NOTE — Progress Notes (Signed)
Daily Session Note  Patient Details  Name: Cynthia Herrera MRN: [id number] Date of Birth: 1945-12-31 Referring Provider:   [name] Pulmonary Rehab Walk Test from 12/08/2020 in [place]  Referring Provider Dr. Chase Caller      Encounter Date: 02/12/2021  Check In:  Session Check In - 02/12/21 1111      Check-In   Supervising physician immediately available to respond to emergencies [facility] immediately available    Physician(s) Dr. Tawanna Solo    Location [facility]    Staff Present Rosebud Poles, RN, BSN;Lisa Ysidro Evert, RN;Jeanclaude Wentworth Hassell Done, MS, ACSM-CEP, Exercise Physiologist    Virtual Visit No    Medication changes reported     No    Fall or balance concerns reported    No    Tobacco Cessation No Change    Warm-up and Cool-down Performed on first and last piece of equipment    Resistance Training Performed Yes    VAD Patient? No    PAD/SET Patient? No      Pain Assessment   Currently in Pain? No/denies    Multiple Pain Sites No           Capillary Blood Glucose: No results found for this or any previous visit (from the past 24 hour(s)).    Social History   Tobacco Use  Smoking Status Former Smoker  . Packs/day: 1.50  . Years: 35.00  . Pack years: 52.50  . Types: Cigarettes  . Quit date: 08/24/2001  . Years since quitting: 19.4  Smokeless Tobacco Never Used    Goals Met:  Proper associated with RPD/PD & O2 Sat Improved SOB with ADL's Exercise tolerated well Completed pulmonary rehab undergrad program  Goals Unmet:  Not Applicable  Comments: Service time is from 1025 to 1050 Patient completed pulmonary rehab program today and made improvements with 6 minute walk test.    Dr. [name] is Medical Director for Cardiac Rehab at [facility].

## 2021-02-16 ENCOUNTER — Ambulatory Visit: Payer: Medicare Other | Admitting: Internal Medicine

## 2021-02-16 ENCOUNTER — Encounter: Payer: Self-pay | Admitting: Internal Medicine

## 2021-02-16 ENCOUNTER — Other Ambulatory Visit: Payer: Self-pay

## 2021-02-16 VITALS — BP 116/64 | HR 66 | Ht 66.0 in | Wt 235.8 lb

## 2021-02-16 DIAGNOSIS — I4819 Other persistent atrial fibrillation: Secondary | ICD-10-CM

## 2021-02-16 DIAGNOSIS — I428 Other cardiomyopathies: Secondary | ICD-10-CM | POA: Diagnosis not present

## 2021-02-16 DIAGNOSIS — I5032 Chronic diastolic (congestive) heart failure: Secondary | ICD-10-CM | POA: Diagnosis not present

## 2021-02-16 DIAGNOSIS — G4733 Obstructive sleep apnea (adult) (pediatric): Secondary | ICD-10-CM

## 2021-02-16 MED ORDER — FUROSEMIDE 20 MG PO TABS: 20.0000 mg | ORAL_TABLET | Freq: Every day | ORAL | 3 refills | Status: DC

## 2021-02-16 NOTE — Patient Instructions (Addendum)
Medication Instructions:  Reduce your Lasix to 20 mg daily.  Your physician recommends that you continue on your current medications as directed. Please refer to the Current Medication list given to you today.  Labwork: None ordered.  Testing/Procedures: None ordered.  Follow-Up:  Your physician wants you to follow-up in: 6 months with   Tommye Standard, PA-C     You will receive a reminder letter in the mail two months in advance. If you don't receive a letter, please call our office to schedule the follow-up appointment.   Any Other Special Instructions Will Be Listed Below (If Applicable).  If you need a refill on your cardiac medications before your next appointment, please call your pharmacy.

## 2021-02-16 NOTE — Progress Notes (Signed)
PCP: Isaac Bliss, Rayford Halsted, MD Primary Cardiologist: Dr Radford Pax Primary EP: Dr Rayann Heman  Cynthia Herrera is a 75 y.o. female who presents today for routine electrophysiology followup.  Since last being seen in our clinic, the patient reports doing very well.  She has had some postural dizziness recently.  No afib.  SOB is stable. Today, she denies symptoms of palpitations, chest pain,   lower extremity edema,   presyncope, or syncope.  The patient is otherwise without complaint today.   Past Medical History:  Diagnosis Date  . Arthritis   . Breast cancer, left breast ([place]) 2002   "then treated w/chemo and radiation"  . Breast cancer, left breast ([place]) 07/14/2017   "tx'd w/mastectomy"  . Chronic back pain    h/o lumbar stenosis; "no problem since my OR" (`10/27/2017)  . Chronic systolic CHF (congestive heart failure) ([place])   . Complication of anesthesia    last surgery iv med when going to sleep"burned as injected"  . COPD (chronic obstructive pulmonary disease) ([facility])    no inhalers   . DCM (dilated cardiomyopathy) ([place])    EF 15-20% ? tachycardia induced - EF 35-40% by echo 05/2018  . Dyspnea    with exertion  . Gallstones   . GERD (gastroesophageal reflux disease)    once in a while;depends on what she eats  . History of bronchitis    "chronic when I smoked; no problem since I quit in 2002" (10/27/2017)  . Hyperlipidemia   . Hypertension    takes Losartan and Metoprolol.   . Joint pain   . Muscle spasm    takes Flexeril daily as needed   . Nocturia   . OSA (obstructive sleep apnea) 08/22/2018   Severe obstructive sleep apnea with an AHI 47.3/h and no significant central sleep apnea with moderate oxygen desaturations as low as 79%  . Osteopenia   . Persistent atrial fibrillation ([place])    s/p TEE DCCV and repeat DCCV 11/14/2013 and 02/2018  . Personal history of colonic polyps    adenomas 03 and 08  . Pneumonia    hx  . Seasonal allergies   . Thyroid nodule   . Type II  diabetes mellitus ([place])   . Vitamin D deficiency    takes Vit D   Past Surgical History:  Procedure Laterality Date  . ATRIAL FIBRILLATION ABLATION N/A 12/12/2018   Procedure: ATRIAL FIBRILLATION ABLATION;  Surgeon: Thompson Grayer, MD;  Location: [place] CV LAB;  Service: Cardiovascular;  Laterality: N/A;  . AXILLARY LYMPH NODE DISSECTION Left 10/2001   persistent intramammary node Archie Endo 03/09/2011  . BACK SURGERY    . BREAST BIOPSY Left 2002  . BREAST BIOPSY Right 06/2017   "node bx was negative"  . BREAST BIOPSY Left 06/2017   "positive for cancer"  . BREAST LUMPECTOMY WITH RADIOACTIVE SEED LOCALIZATION Right 08/15/2017   Procedure: RIGHT BREAST LUMPECTOMY WITH RADIOACTIVE SEED LOCALIZATION;  Surgeon: Jovita Kussmaul, MD;  Location: [place];  Service: General;  Laterality: Right;  . CARDIAC CATHETERIZATION  2015  . CARDIOVERSION N/A 11/12/2013   Procedure: CARDIOVERSION;  Surgeon: Thayer Headings, MD;  Location: [facility] ENDOSCOPY;  Service: Cardiovascular;  Laterality: N/A;  10:08  Dr. Marissa Nestle, anesthesia present, [place]   60mg ,  propofol 50mg , IV for elective cardioversion....Dr. Cathie Olden delievered synch 120 joules with successful cardioversion to NSR  . CARDIOVERSION N/A 11/12/2013   Procedure: CARDIOVERSION;  Surgeon: Thayer Headings, MD;  Location: [place];  Service: Cardiovascular;  Laterality: N/A;  . CARDIOVERSION N/A 11/14/2013   Procedure: CARDIOVERSION (BEDSIDE);  Surgeon: Larey Dresser, MD;  Location: [place];  Service: Cardiovascular;  Laterality: N/A;  . CARDIOVERSION N/A 02/22/2018   Procedure: CARDIOVERSION;  Surgeon: Sueanne Margarita, MD;  Location: [facility] ENDOSCOPY;  Service: Cardiovascular;  Laterality: N/A;  . CATARACT EXTRACTION W/ INTRAOCULAR LENS  IMPLANT, BILATERAL Bilateral ~ 2016  . COLONOSCOPY  2003, September 2008, April 01, 2011   adenomas 03 and 08, polyp 12, diverticulosis  . COLONOSCOPY WITH PROPOFOL N/A 11/16/2016   Procedure: COLONOSCOPY WITH PROPOFOL;  Surgeon:  Gatha Mayer, MD;  Location: [facility] ENDOSCOPY;  Service: Endoscopy;  Laterality: N/A;  . JOINT REPLACEMENT    . LAPAROSCOPIC CHOLECYSTECTOMY  2004  . LEFT AND RIGHT HEART CATHETERIZATION WITH CORONARY ANGIOGRAM N/A 02/01/2014   Procedure: LEFT AND RIGHT HEART CATHETERIZATION WITH CORONARY ANGIOGRAM;  Surgeon: Sueanne Margarita, MD;  Location: [place] CATH LAB;  Service: Cardiovascular;  Laterality: N/A;  . LUMBAR LAMINECTOMY/DECOMPRESSION MICRODISCECTOMY N/A 03/19/2014   Procedure: LUMBAR LAMINECTOMY/DECOMPRESSION MICRODISCECTOMY 4 LEVEL;  Surgeon: Kristeen Miss, MD;  Location: [place] NEURO ORS;  Service: Neurosurgery;  Laterality: N/A;  L1-2 L2-3 L3-4 L4-5 Laminectomy/Foraminotomy  . MASS EXCISION Left 08/15/2017   Procedure: EXCISION OF LEFT BREAST MASS;  Surgeon: Jovita Kussmaul, MD;  Location: [place];  Service: General;  Laterality: Left;  Marland Kitchen MASTECTOMY Left 10/27/2017  . MASTECTOMY PARTIAL / LUMPECTOMY W/ AXILLARY LYMPHADENECTOMY  05/08/2001   Archie Endo 03/09/2011  . PORT-A-CATH REMOVAL  2003  . PORTA CATH INSERTION  2002  . TEE WITHOUT CARDIOVERSION N/A 11/12/2013   Procedure: TRANSESOPHAGEAL ECHOCARDIOGRAM (TEE);  Surgeon: Thayer Headings, MD;  Location: [place];  Service: Cardiovascular;  Laterality: N/A;  pt b/p low, pt buccal membranes very dry, lips scapped, pt c/o thirst. NPO since MN and iv FLUIDS TOTAL INFUSING AT TOTAL 20ML HR....  Dr. Cathie Olden order allow NS to bolus during procedure....very dry,NS bolus 250 ml total..pt responding well to meds..  . TOTAL KNEE ARTHROPLASTY Left 2006  . TOTAL KNEE ARTHROPLASTY Right 05/10/2016   Procedure: RIGHT TOTAL KNEE ARTHROPLASTY;  Surgeon: Gaynelle Arabian, MD;  Location: [facility] ORS;  Service: Orthopedics;  Laterality: Right;  With adductor block  . TOTAL MASTECTOMY Left 10/27/2017   Procedure: LEFT MASTECTOMY;  Surgeon: Jovita Kussmaul, MD;  Location: [place];  Service: General;  Laterality: Left;  . TUBAL LIGATION      ROS- all systems are reviewed and negatives except  as per HPI above  Current Outpatient Medications  Medication Sig Dispense Refill  . amoxicillin (AMOXIL) 500 MG capsule Take 2,000 mg by mouth See admin instructions. Take 2000 mg by mouth 1 hour prior to dental appointment  5  . atorvastatin (LIPITOR) 40 MG tablet TAKE 1 TABLET BY MOUTH EVERYDAY AT BEDTIME 90 tablet 2  . Cholecalciferol (VITAMIN D3) 2000 units capsule Take 2,000 Units by mouth daily.    . clotrimazole-betamethasone (LOTRISONE) cream Apply 1 application topically 2 (two) times daily. 30 g 0  . fexofenadine (ALLEGRA) 180 MG tablet Take 180 mg by mouth daily as needed for allergies or rhinitis.    . fluconazole (DIFLUCAN) 150 MG tablet TAKE 1 TABLET BY MOUTH ONE TIME PER WEEK 6 tablet 2  . fluticasone (FLONASE) 50 MCG/ACT nasal spray Place 1 spray into both nostrils daily as needed for allergies.     . furosemide (LASIX) 20 MG tablet TAKE 1 TABLET BY MOUTH TWICE A DAY 180 tablet 1  . gabapentin (  NEURONTIN) 300 MG capsule Take 2 capsules (600 mg total) by mouth at bedtime. 180 capsule 1  . glucose blood test strip Use to check CBG AC and QHS. 200 each 12  . KLOR-CON M20 20 MEQ tablet TAKE 1 TABLET BY MOUTH EVERY DAY 90 tablet 1  . letrozole (FEMARA) 2.5 MG tablet Take 1 tablet (2.5 mg total) by mouth daily. 90 tablet 3  . losartan (COZAAR) 25 MG tablet TAKE 1 TABLET BY MOUTH TWICE A DAY 180 tablet 3  . metFORMIN (GLUCOPHAGE) 500 MG tablet TAKE 1 TABLET BY MOUTH TWICE A DAY WITH MEALS 180 tablet 0  . nebivolol (BYSTOLIC) 2.5 MG tablet TAKE 1 TABLET BY MOUTH EVERY DAY 90 tablet 1  . pantoprazole (PROTONIX) 40 MG tablet Take 1 tablet (40 mg total) by mouth daily. Please call and schedule an appointment with Dr Rayann Heman for further refills 90 tablet 0  . spironolactone (ALDACTONE) 25 MG tablet TAKE 1 TABLET BY MOUTH EVERY DAY 90 tablet 1  . traMADol (ULTRAM) 50 MG tablet Take 50 mg by mouth as needed for moderate pain.    Marland Kitchen XARELTO 20 MG TABS tablet TAKE 1 TABLET BY MOUTH EVERY DAY WITH  SUPPER 30 tablet 10   No current facility-administered medications for this visit.    Physical Exam: Vitals:   02/16/21 1433  BP: 116/64  Pulse: 66  SpO2: 94%  Weight: 235 lb 12.8 oz (107 kg)  Height: 5\' 6"  (1.676 m)    GEN- The patient is overweight appearing, alert and oriented x 3 today.   Head- normocephalic, atraumatic Eyes-  Sclera clear, conjunctiva pink Ears- hearing intact Oropharynx- clear Lungs-   normal work of breathing Heart- Regular rate and rhythm, no murmurs, rubs or gallops, PMI not laterally displaced GI- soft, NT, ND, + BS Extremities- no clubbing, cyanosis, or edema  Wt Readings from Last 3 Encounters:  02/16/21 235 lb 12.8 oz (107 kg)  02/10/21 237 lb 14 oz (107.9 kg)  01/27/21 239 lb 6.7 oz (108.6 kg)    EKG tracing ordered today is personally reviewed and shows sinus  Assessment and Plan:  1. Persistent atrial fibrillation Well controlled post ablation off AAD therapy chads2vasc score is 5.   She is on xarelto  2. HTN Stable No change required today  3. OSA Uses dental appliance  4. Obesity Body mass index is 38.06 kg/m. Lifestyle modification advised  5. Chronic systolic dysfunction/ nonischemic CM EF 45% Given postural dizziness, will reduce lasix to 20mg  daily  Risks, benefits and potential toxicities for medications prescribed and/or refilled reviewed with patient today.   Follow-up to with EP PA in 6 months   Thompson Grayer MD, [facility] 02/16/2021 2:46 PM

## 2021-02-20 ENCOUNTER — Other Ambulatory Visit (HOSPITAL_COMMUNITY)
Admission: RE | Admit: 2021-02-20 | Discharge: 2021-02-20 | Disposition: A | Discharge status: Home / Self Care | Payer: Medicare Other | Source: Ambulatory Visit | Attending: Internal Medicine | Admitting: Internal Medicine

## 2021-02-20 DIAGNOSIS — Z20822 Contact with and (suspected) exposure to covid-19: Secondary | ICD-10-CM | POA: Diagnosis not present

## 2021-02-20 DIAGNOSIS — Z01812 Encounter for preprocedural laboratory examination: Secondary | ICD-10-CM | POA: Insufficient documentation

## 2021-02-21 LAB — SARS CORONAVIRUS 2 (TAT 6-24 HRS): SARS Coronavirus 2: NEGATIVE

## 2021-02-23 ENCOUNTER — Other Ambulatory Visit: Payer: Self-pay

## 2021-02-23 ENCOUNTER — Ambulatory Visit (INDEPENDENT_AMBULATORY_CARE_PROVIDER_SITE_OTHER): Payer: Medicare Other | Admitting: Internal Medicine

## 2021-02-23 DIAGNOSIS — E669 Obesity, unspecified: Secondary | ICD-10-CM

## 2021-02-23 DIAGNOSIS — R0602 Shortness of breath: Secondary | ICD-10-CM

## 2021-02-23 DIAGNOSIS — I519 Heart disease, unspecified: Secondary | ICD-10-CM

## 2021-02-23 LAB — PULMONARY FUNCTION TEST
DL/VA % pred: 97 %
DL/VA: 3.95 ml/min/mmHg/L
DLCO cor % pred: 78 %
DLCO cor: 16.04 ml/min/mmHg
DLCO unc % pred: 78 %
DLCO unc: 16.04 ml/min/mmHg
FEF 25-75 Post: 3.08 L/sec
FEF 25-75 Pre: 1.69 L/sec
FEF2575-%Change-Post: 82 %
FEF2575-%Pred-Post: 173 %
FEF2575-%Pred-Pre: 95 %
FEV1-%Change-Post: 11 %
FEV1-%Pred-Post: 92 %
FEV1-%Pred-Pre: 82 %
FEV1-Post: 2.13 L
FEV1-Pre: 1.91 L
FEV1FVC-%Change-Post: 5 %
FEV1FVC-%Pred-Pre: 104 %
FEV6-%Change-Post: 6 %
FEV6-%Pred-Post: 88 %
FEV6-%Pred-Pre: 83 %
FEV6-Post: 2.59 L
FEV6-Pre: 2.43 L
FEV6FVC-%Change-Post: 0 %
FEV6FVC-%Pred-Post: 104 %
FEV6FVC-%Pred-Pre: 104 %
FVC-%Change-Post: 6 %
FVC-%Pred-Post: 84 %
FVC-%Pred-Pre: 79 %
FVC-Post: 2.6 L
FVC-Pre: 2.45 L
Post FEV1/FVC ratio: 82 %
Post FEV6/FVC ratio: 100 %
Pre FEV1/FVC ratio: 78 %
Pre FEV6/FVC Ratio: 100 %
RV % pred: 88 %
RV: 2.12 L
TLC % pred: 85 %
TLC: 4.59 L

## 2021-02-23 NOTE — Progress Notes (Signed)
PFT done today. 

## 2021-03-02 NOTE — Progress Notes (Signed)
Discharge Progress Report  Patient Details  Name: Cynthia Herrera MRN: [id number] Date of Birth: Mar 15, 1946 Referring Provider:   [name] Walk Test from 12/08/2020 in [place]  Referring Provider Dr. Chase Caller       Number of Visits: 17  Reason for Discharge:  Patient reached a stable level of exercise. Patient independent in their exercise. Patient has met program and personal goals.  Smoking History:  Social History   Tobacco Use  Smoking Status Former Smoker  . Packs/day: 1.50  . Years: 35.00  . Pack years: 52.50  . Types: Cigarettes  . Quit date: 08/24/2001  . Years since quitting: 19.5  Smokeless Tobacco Never Used    Diagnosis:  Heart failure, chronic systolic (HCC)  ADL [facility]:  Pulmonary Assessment Scores    Row Name 12/08/20 1130 12/08/20 1227 02/12/21 1054     ADL [facility]   ADL Phase -- Entry Exit   SOB Score total -- 49 --     CAT Score   CAT Score -- 16 --     mMRC Score   mMRC Score $RemoveBe'3 3 2   '[id number]$ Row Name 02/12/21 1223         ADL [facility]   ADL Phase Exit     SOB Score total 39           CAT Score   CAT Score 9            Initial Exercise Prescription:  Initial Exercise Prescription - 12/09/20 1500      Date of Initial Exercise RX and Referring Provider   Date 12/08/20    Referring Provider Dr. Chase Caller    Expected Discharge Date 02/12/21      NuStep   Level 1    SPM 70    Minutes 30      Prescription Details   Frequency (times per week) 2    Duration Progress to 45 minutes of aerobic exercise without signs/symptoms of physical distress      Intensity   THRR 40-80% of Max Heartrate 58-117    Ratings of Perceived Exertion 11-13    Perceived Dyspnea 0-4      Progression   Progression Continue to progress workloads to maintain intensity without signs/symptoms of physical distress.      Resistance Training   Training Prescription Yes    Weight orange bands    Reps 10-15            Discharge Exercise Prescription (Final Exercise Prescription Changes):  Exercise Prescription Changes - 02/10/21 1400      Response to Exercise   Blood Pressure (Admit) 112/70    Blood Pressure (Exercise) 112/70    Blood Pressure (Exit) 116/56    Heart Rate (Admit) 66 bpm    Heart Rate (Exercise) 74 bpm    Heart Rate (Exit) 63 bpm    Oxygen Saturation (Admit) 95 %    Oxygen Saturation (Exercise) 97 %    Oxygen Saturation (Exit) 96 %    Rating of Perceived Exertion (Exercise) 8    Perceived Dyspnea (Exercise) 1    Duration Continue with 30 min of aerobic exercise without signs/symptoms of physical distress.    Intensity THRR unchanged      Resistance Training   Training Prescription Yes    Weight orange bands    Reps 10-15    Time 10 Minutes      NuStep   Level 5    SPM  80    Minutes 30    METs 1.8           Functional Capacity:  6 Minute Walk    Row Name 12/08/20 1228 02/12/21 1201       6 Minute Walk   Phase Initial Discharge    Distance 869 feet 960 feet    Distance % Change -- 10.47 %    Distance Feet Change -- 91 ft    Walk Time 6 minutes 6 minutes    # of Rest Breaks 1 0    MPH 1.64 1.82    METS 1.25 1.69    RPE 12 12    Perceived Dyspnea  2 1    VO2 Peak 4.38 5.91    Symptoms Yes (comment) No    Comments Back pain and shortness of breath caused her to rest x 1, she has a limp and pushing a wheelchair steadied her Used rolator    Resting HR 68 bpm 68 bpm    Resting BP 102/60 126/62    Resting Oxygen Saturation  95 % 94 %    Exercise Oxygen Saturation  during 6 min walk 88 % 93 %    Max Ex. HR 94 bpm 105 bpm    Max Ex. BP 126/70 154/68    2 Minute Post BP 110/62 122/60         Interval HR   1 Minute HR 72 86    2 Minute HR 93 99    3 Minute HR 87 99    4 Minute HR 93 103    5 Minute HR 94 105    6 Minute HR -- 105    2 Minute Post HR 65 77    Interval Heart Rate? Yes Yes         Interval Oxygen   Interval Oxygen? Yes Yes     Baseline Oxygen Saturation % 95 % 94 %    1 Minute Oxygen Saturation % 88 % 94 %    1 Minute Liters of Oxygen 0 L 0 L    2 Minute Oxygen Saturation % 95 % 95 %    2 Minute Liters of Oxygen 0 L 0 L    3 Minute Oxygen Saturation % 93 % 96 %    3 Minute Liters of Oxygen 0 L 0 L    4 Minute Oxygen Saturation % 98 % 93 %    4 Minute Liters of Oxygen 0 L 0 L    5 Minute Oxygen Saturation % 96 % 96 %    5 Minute Liters of Oxygen 0 L 0 L    6 Minute Oxygen Saturation % -- 96 %    6 Minute Liters of Oxygen 0 L 0 L    2 Minute Post Oxygen Saturation % 98 % 96 %    2 Minute Post Liters of Oxygen 0 L 0 L           Psychological, QOL, Others - Outcomes: PHQ 2/9: Depression screen [facility] 2/9 02/12/2021 12/08/2020 02/12/2020 01/02/2019 05/22/2018  Decreased Interest 0 0 0 0 0  Down, Depressed, Hopeless 0 0 0 0 0  PHQ - 2 Score 0 0 0 0 0  Altered sleeping 0 0 0 - -  Tired, decreased energy 0 0 1 - -  Change in appetite 0 0 0 - -  Feeling bad or failure about yourself  0 0 0 - -  Trouble concentrating 0 0 0 - -  Moving slowly or fidgety/restless 0 0 0 - -  Suicidal thoughts 0 0 0 - -  PHQ-9 Score 0 - 1 - -  Difficult doing work/chores Not difficult at all Not difficult at all Not difficult at all - -  Some recent data might be hidden    Quality of Life:   Personal Goals: Goals established at orientation with interventions provided to work toward goal.  Personal Goals and Risk Factors at Admission - 12/08/20 1124      Core Components/Risk Factors/Patient Goals on Admission   Improve shortness of breath with ADL's Yes    Intervention Provide education, individualized exercise plan and daily activity instruction to help decrease symptoms of SOB with activities of daily living.    Expected Outcomes Short Term: Improve cardiorespiratory fitness to achieve a reduction of symptoms when performing ADLs;Long Term: Be able to perform more ADLs without symptoms or delay the onset of symptoms             Personal Goals Discharge:  Goals and Risk Factor Review    Row Name 12/18/20 0933 01/19/21 1159 02/10/21 0857         Core Components/Risk Factors/Patient Goals Review   Personal Goals Review Heart Failure Heart Failure Develop more efficient breathing techniques such as purse lipped breathing and diaphragmatic breathing and practicing self-pacing with activity.;Increase knowledge of respiratory medications and ability to use respiratory devices properly.;Improve shortness of breath with ADL's     Review She just started program, has exercised once with [place], for now her heart failure is stable. Jean's heart failure is stable, she is making slow progress with her exercise.  She is exercising @ 1.9-2.1 mets on the stepper and walking 7-8 laps on the track in 10 minutes. Romie Minus has improved her deconditioning, but is limited by her back and knee pain.  Some days she cannot walk the track due to pain.     Expected Outcomes See admission goals. See admission goals. For Romie Minus to continue exercising after graduation, which is 02/12/2021.            Exercise Goals and Review:  Exercise Goals    Row Name 12/08/20 1533             Exercise Goals   Increase Physical Activity Yes       Intervention Provide advice, education, support and counseling about physical activity/exercise needs.;Develop an individualized exercise prescription for aerobic and resistive training based on initial evaluation findings, risk stratification, comorbidities and participant's personal goals.       Expected Outcomes Short Term: Attend rehab on a regular basis to increase amount of physical activity.;Long Term: Add in home exercise to make exercise part of routine and to increase amount of physical activity.;Long Term: Exercising regularly at least 3-5 days a week.       Increase Strength and Stamina Yes       Intervention Provide advice, education, support and counseling about physical activity/exercise needs.;Develop an  individualized exercise prescription for aerobic and resistive training based on initial evaluation findings, risk stratification, comorbidities and participant's personal goals.       Expected Outcomes Short Term: Increase workloads from initial exercise prescription for resistance, speed, and METs.;Short Term: Perform resistance training exercises routinely during rehab and add in resistance training at home;Long Term: Improve cardiorespiratory fitness, muscular endurance and strength as measured by increased METs and functional capacity (6MWT)       Able to understand and use rate of perceived exertion (RPE)  scale Yes       Intervention Provide education and explanation on how to use RPE scale       Expected Outcomes Short Term: Able to use RPE daily in rehab to express subjective intensity level;Long Term:  Able to use RPE to guide intensity level when exercising independently       Able to understand and use Dyspnea scale Yes       Intervention Provide education and explanation on how to use Dyspnea scale       Expected Outcomes Short Term: Able to use Dyspnea scale daily in rehab to express subjective sense of shortness of breath during exertion;Long Term: Able to use Dyspnea scale to guide intensity level when exercising independently       Knowledge and understanding of Target Heart Rate Range (THRR) Yes       Intervention Provide education and explanation of THRR including how the numbers were predicted and where they are located for reference       Expected Outcomes Short Term: Able to state/look up THRR;Long Term: Able to use THRR to govern intensity when exercising independently;Short Term: Able to use daily as guideline for intensity in rehab       Understanding of Exercise Prescription Yes       Intervention Provide education, explanation, and written materials on patient's individual exercise prescription       Expected Outcomes Short Term: Able to explain program exercise  prescription;Long Term: Able to explain home exercise prescription to exercise independently              Exercise Goals Re-Evaluation:  Exercise Goals Re-Evaluation    Row Name 12/22/20 0936 01/20/21 0811 01/20/21 0820         Exercise Goal Re-Evaluation   Exercise Goals Review Increase Physical Activity;Increase Strength and Stamina;Able to understand and use rate of perceived exertion (RPE) scale;Able to understand and use Dyspnea scale;Knowledge and understanding of Target Heart Rate Range (THRR);Understanding of Exercise Prescription Increase Physical Activity;Increase Strength and Stamina;Able to understand and use rate of perceived exertion (RPE) scale;Able to understand and use Dyspnea scale;Knowledge and understanding of Target Heart Rate Range (THRR);Understanding of Exercise Prescription (P)  Increase Physical Activity;Increase Strength and Stamina;Able to understand and use rate of perceived exertion (RPE) scale;Able to understand and use Dyspnea scale;Knowledge and understanding of Target Heart Rate Range (THRR);Understanding of Exercise Prescription     Comments Pt has completed 2 exercise sessions and is learning to become independent with exercise. She does the Nustep for 20 minutes and walks for 10 minutes using a rolator. She has tolerated exercise well so far. It is too early to see any progression, but we will continue to monitor and progress as she is able. She is currently exercising at 1.7 METS on the Nustep and 2.05 METS walking the track. -- Pt has completed 9 exercise sessions and has tolerated well so far. She has become independent with all resistance band exercises and stretches. She has been a little slow to make progressions with MET and workload increases but has made minimal increases. She is currently exercising at 1.9 METS on level 3 on the Nustep and 1.81 METs walking the track. Will continue to monitor and progress as she is able.     Expected Outcomes Through  exercise at rehab and home the patient will decrease shortness of breath with daily activities and feel confident in carrying out an exercise regimn at home. Through exercise at rehab and home the patient  will decrease shortness of breath with daily activities and feel confident in carrying out an exercise regimn at home. (P)  Through exercise at rehab and home the patient will decrease shortness of breath with daily activities and feel confident in carrying out an exercise regimn at home.            Nutrition & Weight - Outcomes:  Pre Biometrics - 12/08/20 1110      Pre Biometrics   Height $Remov'5\' 6"'HgQgxW$  (1.676 m)    Weight 107.1 kg    BMI (Calculated) 38.13    Grip Strength 18 kg           Post Biometrics - 02/12/21 1054       Post  Biometrics   Grip Strength 35 kg           Nutrition:  Nutrition Therapy & Goals - 12/30/20 1151      Nutrition Therapy   Diet TLC; carb modified      Personal Nutrition Goals   Nutrition Goal Pt to record daily weights    Personal Goal #2 Pt to identify and limit food sources of saturated fat, trans fat, refined carbohydrates and sodium    Personal Goal #3 Pt to identify food quantities necessary to achieve weight loss of 6-24 lb at graduation from cardiac rehab      [place], educate and counsel regarding individualized specific dietary modifications aiming towards targeted core components such as weight, hypertension, lipid management, diabetes, heart failure and other comorbidities.    Expected Outcomes Short Term Goal: Understand basic principles of dietary content, such as calories, fat, sodium, cholesterol and nutrients.           Nutrition Discharge:   Education Questionnaire Score:  Knowledge Questionnaire Score - 02/12/21 1217      Knowledge Questionnaire Score   Post Score 14/18           Goals reviewed with patient; copy given to patient.

## 2021-03-05 ENCOUNTER — Other Ambulatory Visit: Payer: Self-pay | Admitting: Internal Medicine

## 2021-03-05 DIAGNOSIS — H26493 Other secondary cataract, bilateral: Secondary | ICD-10-CM | POA: Diagnosis not present

## 2021-03-05 DIAGNOSIS — H524 Presbyopia: Secondary | ICD-10-CM | POA: Diagnosis not present

## 2021-03-05 DIAGNOSIS — E1140 Type 2 diabetes mellitus with diabetic neuropathy, unspecified: Secondary | ICD-10-CM

## 2021-03-05 DIAGNOSIS — E119 Type 2 diabetes mellitus without complications: Secondary | ICD-10-CM | POA: Diagnosis not present

## 2021-03-05 DIAGNOSIS — H40013 Open angle with borderline findings, low risk, bilateral: Secondary | ICD-10-CM | POA: Diagnosis not present

## 2021-03-05 LAB — HM DIABETES EYE EXAM

## 2021-03-05 NOTE — Telephone Encounter (Signed)
Pt's pharmacy is requesting a refill on pantoprazole. Would Dr. Allred like to refill this medication? Please address 

## 2021-03-06 ENCOUNTER — Ambulatory Visit: Payer: Medicare Other | Admitting: Cardiology

## 2021-03-06 ENCOUNTER — Other Ambulatory Visit: Payer: Self-pay

## 2021-03-06 ENCOUNTER — Encounter: Payer: Self-pay | Admitting: Cardiology

## 2021-03-06 VITALS — BP 112/64 | HR 87 | Ht 66.0 in | Wt 236.2 lb

## 2021-03-06 DIAGNOSIS — I4819 Other persistent atrial fibrillation: Secondary | ICD-10-CM | POA: Diagnosis not present

## 2021-03-06 DIAGNOSIS — I420 Dilated cardiomyopathy: Secondary | ICD-10-CM | POA: Diagnosis not present

## 2021-03-06 DIAGNOSIS — E785 Hyperlipidemia, unspecified: Secondary | ICD-10-CM | POA: Diagnosis not present

## 2021-03-06 DIAGNOSIS — I2510 Atherosclerotic heart disease of native coronary artery without angina pectoris: Secondary | ICD-10-CM | POA: Diagnosis not present

## 2021-03-06 DIAGNOSIS — I10 Essential (primary) hypertension: Secondary | ICD-10-CM | POA: Diagnosis not present

## 2021-03-06 DIAGNOSIS — E119 Type 2 diabetes mellitus without complications: Secondary | ICD-10-CM

## 2021-03-06 DIAGNOSIS — I5042 Chronic combined systolic (congestive) and diastolic (congestive) heart failure: Secondary | ICD-10-CM

## 2021-03-06 DIAGNOSIS — G4733 Obstructive sleep apnea (adult) (pediatric): Secondary | ICD-10-CM | POA: Diagnosis not present

## 2021-03-06 MED ORDER — SPIRONOLACTONE 25 MG PO TABS: 1.0000 | ORAL_TABLET | Freq: Every day | ORAL | 2 refills | Status: DC

## 2021-03-06 MED ORDER — KLOR-CON M20 20 MEQ PO TBCR: 20.0000 meq | EXTENDED_RELEASE_TABLET | Freq: Every day | ORAL | 2 refills | Status: DC

## 2021-03-06 MED ORDER — LOSARTAN POTASSIUM 25 MG PO TABS: 25.0000 mg | ORAL_TABLET | Freq: Two times a day (BID) | ORAL | 2 refills | Status: DC

## 2021-03-06 MED ORDER — NEBIVOLOL HCL 2.5 MG PO TABS: 2.5000 mg | ORAL_TABLET | Freq: Every day | ORAL | 2 refills | Status: DC

## 2021-03-06 NOTE — Progress Notes (Signed)
Cardiology Office Note:    Date:  03/06/2021   ID:  [name], DOB [birth year], MRN [id number]  PCP:  Isaac Bliss, Rayford Halsted, MD  Cardiologist:  [name], MD    Referring MD: Isaac Bliss, [name]   Chief Complaint  Patient presents with  . Congestive Heart Failure  . Cardiomyopathy  . Hypertension  . Atrial Fibrillation    History of Present Illness:    Cynthia Herrera is a 75 y.o. 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 She was again cardioverted on 1/21/18after more loading with amio but failed to convert. Later that day she spontaneously converted to NSR. She stopped amio due to severe dizziness.   I saw her back in April 2018 she was back in atrial fibrillation with RVR.  She underwent repeat cardioversion to sinus rhythm on 02/22/2018.  Myoview 03/2018 showed EF improved  with no ischemia.  She is now followed in afib clinic.   Repeat 2D echocardiogram 06/21/2018 showed improvement in LV function with EF improved from 25% up to 35 to 40%. She was seen by Dr. Haroldine Laws  for chronic systolic heart failure with nonischemic dilated tachycardia mediated A. fib.   She underwent sleep study showing severe obstructive sleep apnea with an AHI 47.3/h and no significant central sleep apnea with moderate oxygen desaturations as low as 79%.  She ultimately ended up seeing Dr. Ron Parker for an oral device for her OSA but was supposed to get a CPAP titration done.   She was seen by Dr. Rayann Heman last week and was complaining of some postural dizziness and her Lasix was decreased to 20mg  daily. She is followed by Dr. Chase Caller for her COPD.  She is here today for followup of her coronary artery calcifications, chronic combined  systolic/diastolic  CHF, DCM, persistent atrial fibrillation and HTN and is doing well.  She denies any chest pain or pressure, PND, orthopnea, palpitations or syncope. She has chronic DOE and LE edema which is actually better. She uses Lasix 20mg  daily and only takes an extra Lasix if she has more fluid on board.  SHe says that her dizziness has resolved since cutting back on her Lasix.  She is compliant with her meds and is tolerating meds with no SE.    Past Medical History:  Diagnosis Date  . Arthritis   . Breast cancer, left breast ([place]) 2002   "then treated w/chemo and radiation"  . Breast cancer, left breast ([place]) 07/14/2017   "tx'd w/mastectomy"  . Chronic back pain    h/o lumbar stenosis; "no problem since my OR" (`10/27/2017)  . Chronic systolic CHF (congestive heart failure) ([place])   . Complication of anesthesia    last surgery iv med when going to sleep"burned as injected"  . COPD (chronic obstructive pulmonary disease) ([facility])    no inhalers   . DCM (dilated cardiomyopathy) ([place])    EF 15-20% ? tachycardia induced - EF 35-40% by echo 05/2018  . Dyspnea    with exertion  . Gallstones   . GERD (gastroesophageal reflux disease)    once in a while;depends on what she eats  . History of bronchitis    "chronic when I smoked; no problem since I quit in 2002" (10/27/2017)  . Hyperlipidemia   .  Hypertension    takes Losartan and Metoprolol.   . Joint pain   . Muscle spasm    takes Flexeril daily as needed   . Nocturia   . OSA (obstructive sleep apnea) 08/22/2018   Severe obstructive sleep apnea with an AHI 47.3/h and no significant central sleep apnea with moderate oxygen desaturations as low as 79%  . Osteopenia   . Persistent atrial fibrillation ([place])    s/p TEE DCCV and repeat DCCV 11/14/2013 and 02/2018  . Personal history of colonic polyps    adenomas 03 and 08  . Pneumonia    hx  . Seasonal allergies   . Thyroid nodule   . Type II diabetes mellitus ([place])   . Vitamin D  deficiency    takes Vit D    Past Surgical History:  Procedure Laterality Date  . ATRIAL FIBRILLATION ABLATION N/A 12/12/2018   Procedure: ATRIAL FIBRILLATION ABLATION;  Surgeon: Thompson Grayer, MD;  Location: [facility];  Service: Cardiovascular;  Laterality: N/A;  . AXILLARY LYMPH NODE DISSECTION Left 10/2001   persistent intramammary node Archie Endo 03/09/2011  . BACK SURGERY    . BREAST BIOPSY Left 2002  . BREAST BIOPSY Right 06/2017   "node bx was negative"  . BREAST BIOPSY Left 06/2017   "positive for cancer"  . BREAST LUMPECTOMY WITH RADIOACTIVE SEED LOCALIZATION Right 08/15/2017   Procedure: RIGHT BREAST LUMPECTOMY WITH RADIOACTIVE SEED LOCALIZATION;  Surgeon: Jovita Kussmaul, MD;  Location: [facility];  Service: General;  Laterality: Right;  . CARDIAC CATHETERIZATION  2015  . CARDIOVERSION N/A 11/12/2013   Procedure: CARDIOVERSION;  Surgeon: Thayer Headings, MD;  Location: [facility];  Service: Cardiovascular;  Laterality: N/A;  10:08  Dr. Marissa Nestle, anesthesia present, [place]   60mg ,  propofol 50mg , IV for elective cardioversion....Dr. Cathie Olden delievered synch 120 joules with successful cardioversion to NSR  . CARDIOVERSION N/A 11/12/2013   Procedure: CARDIOVERSION;  Surgeon: Thayer Headings, MD;  Location: [place];  Service: Cardiovascular;  Laterality: N/A;  . CARDIOVERSION N/A 11/14/2013   Procedure: CARDIOVERSION (BEDSIDE);  Surgeon: Larey Dresser, MD;  Location: [facility];  Service: Cardiovascular;  Laterality: N/A;  . CARDIOVERSION N/A 02/22/2018   Procedure: CARDIOVERSION;  Surgeon: Sueanne Margarita, MD;  Location: [facility] ENDOSCOPY;  Service: Cardiovascular;  Laterality: N/A;  . CATARACT EXTRACTION W/ INTRAOCULAR LENS  IMPLANT, BILATERAL Bilateral ~ 2016  . COLONOSCOPY  2003, September 2008, April 01, 2011   adenomas 03 and 08, polyp 12, diverticulosis  . COLONOSCOPY WITH PROPOFOL N/A 11/16/2016   Procedure: COLONOSCOPY WITH PROPOFOL;  Surgeon: Gatha Mayer, MD;  Location: [facility]  ENDOSCOPY;  Service: Endoscopy;  Laterality: N/A;  . JOINT REPLACEMENT    . LAPAROSCOPIC CHOLECYSTECTOMY  2004  . LEFT AND RIGHT HEART CATHETERIZATION WITH CORONARY ANGIOGRAM N/A 02/01/2014   Procedure: LEFT AND RIGHT HEART CATHETERIZATION WITH CORONARY ANGIOGRAM;  Surgeon: Sueanne Margarita, MD;  Location: [facility] CATH LAB;  Service: Cardiovascular;  Laterality: N/A;  . LUMBAR LAMINECTOMY/DECOMPRESSION MICRODISCECTOMY N/A 03/19/2014   Procedure: LUMBAR LAMINECTOMY/DECOMPRESSION MICRODISCECTOMY 4 LEVEL;  Surgeon: Kristeen Miss, MD;  Location: [facility] NEURO ORS;  Service: Neurosurgery;  Laterality: N/A;  L1-2 L2-3 L3-4 L4-5 Laminectomy/Foraminotomy  . MASS EXCISION Left 08/15/2017   Procedure: EXCISION OF LEFT BREAST MASS;  Surgeon: Jovita Kussmaul, MD;  Location: [facility];  Service: General;  Laterality: Left;  Marland Kitchen MASTECTOMY Left 10/27/2017  . MASTECTOMY PARTIAL / LUMPECTOMY W/ AXILLARY LYMPHADENECTOMY  05/08/2001   Archie Endo 03/09/2011  . PORT-A-CATH  REMOVAL  2003  . PORTA CATH INSERTION  2002  . TEE WITHOUT CARDIOVERSION N/A 11/12/2013   Procedure: TRANSESOPHAGEAL ECHOCARDIOGRAM (TEE);  Surgeon: Thayer Headings, MD;  Location: [facility];  Service: Cardiovascular;  Laterality: N/A;  pt b/p low, pt buccal membranes very dry, lips scapped, pt c/o thirst. NPO since MN and iv FLUIDS TOTAL INFUSING AT TOTAL 20ML HR....  Dr. Cathie Olden order allow NS to bolus during procedure....very dry,NS bolus 250 ml total..pt responding well to meds..  . TOTAL KNEE ARTHROPLASTY Left 2006  . TOTAL KNEE ARTHROPLASTY Right 05/10/2016   Procedure: RIGHT TOTAL KNEE ARTHROPLASTY;  Surgeon: Gaynelle Arabian, MD;  Location: [facility];  Service: Orthopedics;  Laterality: Right;  With adductor block  . TOTAL MASTECTOMY Left 10/27/2017   Procedure: LEFT MASTECTOMY;  Surgeon: Jovita Kussmaul, MD;  Location: [place];  Service: General;  Laterality: Left;  . TUBAL LIGATION      Current Medications: Current Meds  Medication Sig  . amoxicillin (AMOXIL) 500 MG  capsule Take 2,000 mg by mouth See admin instructions. Take 2000 mg by mouth 1 hour prior to dental appointment  . atorvastatin (LIPITOR) 40 MG tablet TAKE 1 TABLET BY MOUTH EVERYDAY AT BEDTIME  . Cholecalciferol (VITAMIN D3) 2000 units capsule Take 2,000 Units by mouth daily.  . clotrimazole-betamethasone (LOTRISONE) cream Apply 1 application topically 2 (two) times daily.  . fexofenadine (ALLEGRA) 180 MG tablet Take 180 mg by mouth daily as needed for allergies or rhinitis.  . fluconazole (DIFLUCAN) 150 MG tablet TAKE 1 TABLET BY MOUTH ONE TIME PER WEEK  . fluticasone (FLONASE) 50 MCG/ACT nasal spray Place 1 spray into both nostrils daily as needed for allergies.   . furosemide (LASIX) 20 MG tablet Take 1 tablet (20 mg total) by mouth daily.  Marland Kitchen gabapentin (NEURONTIN) 300 MG capsule TAKE 2 CAPSULES BY MOUTH AT BEDTIME.  Marland Kitchen glucose blood test strip Use to check CBG AC and QHS.  Marland Kitchen KLOR-CON M20 20 MEQ tablet TAKE 1 TABLET BY MOUTH EVERY DAY  . letrozole (FEMARA) 2.5 MG tablet Take 1 tablet (2.5 mg total) by mouth daily.  Marland Kitchen losartan (COZAAR) 25 MG tablet TAKE 1 TABLET BY MOUTH TWICE A DAY  . metFORMIN (GLUCOPHAGE) 500 MG tablet TAKE 1 TABLET BY MOUTH TWICE A DAY WITH MEALS  . nebivolol (BYSTOLIC) 2.5 MG tablet TAKE 1 TABLET BY MOUTH EVERY DAY  . pantoprazole (PROTONIX) 40 MG tablet Take 1 tablet (40 mg total) by mouth daily.  Marland Kitchen spironolactone (ALDACTONE) 25 MG tablet TAKE 1 TABLET BY MOUTH EVERY DAY  . traMADol (ULTRAM) 50 MG tablet Take 50 mg by mouth as needed for moderate pain.  Marland Kitchen XARELTO 20 MG TABS tablet TAKE 1 TABLET BY MOUTH EVERY DAY WITH SUPPER     Allergies:   Tikosyn [dofetilide], Zolpidem, Codeine, Codeine phosphate, and Methylprednisolone   Social History   Socioeconomic History  . Marital status: Widowed    Spouse name: Not on file  . Number of children: 2  . Years of education: Not on file  . Highest education level: Not on file  Occupational History  . Occupation: retired   Tobacco Use  . Smoking status: Former Smoker    Packs/day: 1.50    Years: 35.00    Pack years: 52.50    Types: Cigarettes    Quit date: 08/24/2001    Years since quitting: 19.5  . Smokeless tobacco: Never Used  Vaping Use  . Vaping Use: Never used  Substance and  Sexual Activity  . Alcohol use: No  . Drug use: No  . Sexual activity: Never    Birth control/protection: Post-menopausal  Other Topics Concern  . Not on file  Social History Narrative  . Not on file   Social Determinants of Health   Financial Resource Strain: Low Risk   . Difficulty of Paying Living Expenses: Not very hard  Food Insecurity: Not on file  Transportation Needs: Not on file  Physical Activity: Inactive  . Days of Exercise per Week: 0 days  . Minutes of Exercise per Session: 0 min  Stress: Not on file  Social Connections: Not on file     Family History: The patient's family history includes Alzheimer's disease in her sister; Dementia in her sister; Diabetes in her brother, father, mother, and sister; Heart disease in her brother and father; Irritable bowel syndrome in her brother; Liver cancer in her mother; Pancreatic cancer in her mother.  ROS:   Please see the history of present illness.    ROS  All other systems reviewed and negative.   EKGs/Labs/Other Studies Reviewed:    The following studies were reviewed today: Office notes from Advanced heart failure and afib clinic  EKG:  EKG is not ordered today.   Recent Labs: 07/10/2020: BUN 25; Creatinine, Ser 1.25; Hemoglobin 12.0; NT-Pro BNP 150; Platelets 321; Potassium 4.2; Sodium 142   Recent Lipid Panel    Component Value Date/Time   CHOL 140 01/01/2020 1143   TRIG 159 (H) 01/01/2020 1143   HDL 43 01/01/2020 1143   CHOLHDL 3.3 01/01/2020 1143   CHOLHDL 3 02/03/2017 1207   VLDL 23.0 02/03/2017 1207   LDLCALC 70 01/01/2020 1143    Physical Exam:    VS:  BP 112/64   Pulse 87   Ht 5\' 6"  (1.676 m)   Wt 236 lb 3.2 oz (107.1 kg)    SpO2 94%   BMI 38.12 kg/m     Wt Readings from Last 3 Encounters:  03/06/21 236 lb 3.2 oz (107.1 kg)  02/16/21 235 lb 12.8 oz (107 kg)  02/10/21 237 lb 14 oz (107.9 kg)    GEN: Well nourished, well developed in no acute distress HEENT: Normal NECK: No JVD; No carotid bruits LYMPHATICS: No lymphadenopathy CARDIAC:RRR, no murmurs, rubs, gallops RESPIRATORY:  Clear to auscultation without rales, wheezing or rhonchi  ABDOMEN: Soft, non-tender, non-distended MUSCULOSKELETAL:  No edema; No deformity  SKIN: Warm and dry NEUROLOGIC:  Alert and oriented x 3 PSYCHIATRIC:  Normal affect     ASSESSMENT:    1. Chronic combined systolic and diastolic CHF (congestive heart failure) ([place])   2. DCM (dilated cardiomyopathy) ([place])   3. Persistent atrial fibrillation ([place])   4. Essential hypertension   5. OSA (obstructive sleep apnea)   6. Diabetes mellitus with coincident hypertension ([place])   7. Coronary artery calcification seen on CAT scan   8. Dyslipidemia    PLAN:    In order of problems listed above:  1.  Chronic combined systolic/diastolic CHF  -she does not appear volume overloaded on exam today -her weight has remained very stable since I saw her last -she has chronic DOE related to COPD which has improved -she has not really had any LE edema after cutting back on her Lasix for dizziness and the dizziness has resolved -repeat 2D echo 07/2020 showed mildly reduced LVF with EF 45-50% with G1DD and her hypoxemia was felt to be due to her COPD -continue GDMT for CHF by continuing  on  Bystolic 2.5mg  daily, Losartan 25mg  BID, spiro 25mg  daily -I will check a BMET today to assess renal function on diuretic and ARB  2.  DCM  -suspect tachycardia mediated.   -Cath 2015 with normal cors.   -Treated with chemo/XRT in 2002 for breast CA but does not think she got Adriamycin.   -EF 45% at time of cath in 2015 but dropped to 25% in 02/2018 at time of afib with RVR reoccurrence.  -Echo  05/2018 showed improvement in EF from 25% now up to 35-40%.   -last echo 07/2020 showed EF 45-50% -continue medical management of her HF with BB, diuretic and ARB  3.   Persistent atrial fibrillation  -she continues to maintain NSR On exam today with no palpitations -she is off amio due to severe dizziness -Failed Tikosyn 03/2018 due to QT prolongation.  -no bleeding on DOAC -continue prescription drug management with Xarelto 20mg  daily>>I have refilled this for 90 day supply for 6 months -check BMET and Hbg today since she is on anticoagulation   4.  HTN -her BP is well controlled on exam today -continue Bystolic 2.5mg  daily, Losartan 25mg  BID and spiro   5.  OSA  - PSG showed  severe obstructive sleep apnea with an AHI 47.3/h and no significant central sleep apnea with moderate oxygen desaturations as low as 79%.  -she had a CPAP titration but did not tolerate the mask and is now using an oral device and seeing Dr. Ron Parker  6.  DM type 2 -followed by PCP -Hb1C was 6.7% in March 2022 -continue Metformin and ARB  7.  Coronary artery calcifications -coronary artery calcifications on CT scan and on cath with no obstructive lesions -she has not had any anginal symptoms -continue Lipitor 40mg  daily  8.  HLD -LDL goal < 70  -her LDL was 70 a year ago -I will repeat an FLP and ALT -her lipitor was refilled for a year  Medication Adjustments/Labs and Tests Ordered: Current medicines are reviewed at length with the patient today.  Concerns regarding medicines are outlined above.  No orders of the defined types were placed in this encounter.  No orders of the defined types were placed in this encounter.   Signed, [name], MD  03/06/2021 11:48 AM    [place]

## 2021-03-06 NOTE — Patient Instructions (Signed)
Medication Instructions:  Your physician recommends that you continue on your current medications as directed. Please refer to the Current Medication list given to you today.  *If you need a refill on your cardiac medications before your next appointment, please call your pharmacy*   Lab Work: Fasting lipids, CMET, and CBC   Follow-Up: At [facility], you and your health needs are our priority.  As part of our continuing mission to provide you with exceptional heart care, we have created designated Provider Care Teams.  These Care Teams include your primary Cardiologist (physician) and Advanced Practice Providers (APPs -  Physician Assistants and Nurse Practitioners) who all work together to provide you with the care you need, when you need it.  Your next appointment:   6 month(s)  The format for your next appointment:   In Person  Provider:   You may see [name], MD or one of the following Advanced Practice Providers on your designated Care Team:    Melina Copa, PA-C  Ermalinda Barrios, PA-C

## 2021-03-06 NOTE — Addendum Note (Signed)
Addended by: Antonieta Iba on: 03/06/2021 12:03 PM   Modules accepted: Orders

## 2021-03-11 ENCOUNTER — Other Ambulatory Visit: Payer: Medicare Other

## 2021-03-11 ENCOUNTER — Other Ambulatory Visit: Payer: Self-pay

## 2021-03-12 ENCOUNTER — Encounter: Payer: Self-pay | Admitting: Family Medicine

## 2021-03-12 ENCOUNTER — Ambulatory Visit (INDEPENDENT_AMBULATORY_CARE_PROVIDER_SITE_OTHER): Payer: Medicare Other | Admitting: Family Medicine

## 2021-03-12 VITALS — BP 110/64 | HR 90 | Temp 97.8°F | Wt 238.0 lb

## 2021-03-12 DIAGNOSIS — M109 Gout, unspecified: Secondary | ICD-10-CM | POA: Diagnosis not present

## 2021-03-12 MED ORDER — METHYLPREDNISOLONE 4 MG PO TBPK: ORAL_TABLET | ORAL | 0 refills | Status: DC

## 2021-03-12 NOTE — Progress Notes (Signed)
   Subjective:    Patient ID: Cynthia Herrera, female    DOB: 1946/07/05, 75 y.o.   MRN: [id number]  HPI Here for the sudden onset 2 days ago of swelling and pain in the left wrist. No recent trauma. This happened once before some years ago, and she says an orthopedic doctor gave her a cortisone shot and it went away. She notes that her son has gout.   Review of Systems  Constitutional: Negative.   Cardiovascular: Negative.   Musculoskeletal: Positive for arthralgias.       Objective:   Physical Exam Constitutional:      General: She is not in acute distress.    Appearance: Normal appearance.  Cardiovascular:     Rate and Rhythm: Normal rate. Rhythm irregular.     Pulses: Normal pulses.     Heart sounds: Normal heart sounds.  Pulmonary:     Effort: Pulmonary effort is normal.     Breath sounds: Normal breath sounds.  Musculoskeletal:     Comments: Left wrist is swollen, warm, pink, and quite tender   Neurological:     Mental Status: She is alert.           Assessment & Plan:  This is gout. Treat with a Medrol dose pack. We will put in a future order for a uric acid level the next time she has blood drawn.  Alysia Penna, MD

## 2021-03-17 ENCOUNTER — Encounter: Payer: Self-pay | Admitting: Internal Medicine

## 2021-03-18 ENCOUNTER — Telehealth: Payer: Self-pay | Admitting: Pharmacist

## 2021-03-18 NOTE — Chronic Care Management (AMB) (Signed)
Date- Patient called to remind of appointment with Watt Climes on 03/19/2021 at 4:00 pm   Patient aware of appointment date, time, and type of appointment (either telephone or in person). Patient aware to have/bring all medications, supplements, blood pressure and/or blood sugar logs to visit.  Questions: Have you had any recent office visit or specialist visit outside of [place]? No Are there any concerns you would like to discuss during your office visit? No Are you having any problems obtaining your medications? (Whether it pharmacy issues or cost) No Star Rating Drug:  Dispensed Quantity Pharmacy  Atorvastatin 40 mg 05.16.2022 [facility]  Losartan 25 mg [facility]12.2022 [facility]  Metformin 500 mg 03.03.2022 [facility]    Any gaps in medications fill history?  Maia Breslow, [place] Pharmacist Assistant [phone]

## 2021-03-19 ENCOUNTER — Encounter: Payer: Self-pay | Admitting: *Deleted

## 2021-03-19 ENCOUNTER — Other Ambulatory Visit: Payer: Self-pay | Admitting: *Deleted

## 2021-03-19 ENCOUNTER — Ambulatory Visit (INDEPENDENT_AMBULATORY_CARE_PROVIDER_SITE_OTHER): Payer: Medicare Other | Admitting: Pharmacist

## 2021-03-19 DIAGNOSIS — E1140 Type 2 diabetes mellitus with diabetic neuropathy, unspecified: Secondary | ICD-10-CM | POA: Diagnosis not present

## 2021-03-19 DIAGNOSIS — I4819 Other persistent atrial fibrillation: Secondary | ICD-10-CM | POA: Diagnosis not present

## 2021-03-19 DIAGNOSIS — I10 Essential (primary) hypertension: Secondary | ICD-10-CM | POA: Diagnosis not present

## 2021-03-19 MED ORDER — GLUCOSE BLOOD VI STRP: ORAL_STRIP | 12 refills | Status: AC

## 2021-03-19 NOTE — Progress Notes (Signed)
Chronic Care Management Pharmacy Note  03/24/2021 Name:  Cynthia Herrera MRN:  [id number] DOB:  1946/07/22  Subjective: Cynthia Herrera is an 75 y.o. year old female who is a primary patient of Isaac Bliss, Rayford Halsted, MD.  The CCM team was consulted for assistance with disease management and care coordination needs.    Engaged with patient by telephone for follow up visit in response to provider referral for pharmacy case management and/or care coordination services.   Consent to Services:  The patient was given information about Chronic Care Management services, agreed to services, and gave verbal consent prior to initiation of services.  Please see initial visit note for detailed documentation.   Patient Care Team: Isaac Bliss, Rayford Halsted, MD as PCP - General (Internal Medicine) Sueanne Margarita, MD as PCP - Cardiology (Cardiology) Viona Gilmore, [facility] as Pharmacist (Pharmacist)  Recent office visits: 03/12/21 Alysia Penna, MD: Patient presented for gout flare up. Prescribed Medrol dosepak. Plan for uric acid level with next lab work.   12/24/20 Isaac Bliss, Rayford Halsted, MD (OV). No medication changes.  Recent consult visits: 03/06/21 [name], MD (cardiology): Patient presented for CHF follow up. No medication changes.  02/23/21 Brand Males, MD (pulmonary): Pulmonary function test ordered.  02/16/21 Thompson Grayer, MD (Cardiology): Patient presented for Afib follow up. Decreased furosemide to 20 mg daily.  Hospital visits: None in previous 6 months   Objective:  Lab Results  Component Value Date   CREATININE 1.25 (H) 07/10/2020   BUN 25 07/10/2020   GFR 62.33 02/03/2017   GFRNONAA 42 (L) 07/10/2020   GFRAA 49 (L) 07/10/2020   NA 142 07/10/2020   K 4.2 07/10/2020   CALCIUM 9.6 07/10/2020   CO2 21 07/10/2020   GLUCOSE 109 (H) 07/10/2020    Lab Results  Component Value Date/Time   HGBA1C 6.7 (A) 12/24/2020 11:50 AM   HGBA1C 6.7 (A) 02/12/2020  11:37 AM   HGBA1C 6.2 (H) 01/02/2018 02:51 PM   HGBA1C 6.2 (H) 10/27/2017 08:06 AM   GFR 62.33 02/03/2017 12:07 PM   GFR 61.05 11/06/2015 09:59 AM   MICROALBUR 1.2 02/03/2017 12:07 PM   MICROALBUR 0.7 11/06/2015 09:59 AM    Last diabetic Eye exam:  Lab Results  Component Value Date/Time   HMDIABEYEEXA No Retinopathy 03/05/2021 12:00 AM    Last diabetic Foot exam: No results found for: HMDIABFOOTEX   Lab Results  Component Value Date   CHOL 140 01/01/2020   HDL 43 01/01/2020   LDLCALC 70 01/01/2020   TRIG 159 (H) 01/01/2020   CHOLHDL 3.3 01/01/2020    Hepatic Function Latest Ref Rng & Units 01/01/2020 08/01/2018 03/03/2018  Total Protein 6.0 - 8.5 g/dL 6.6 6.5 7.0  Albumin 3.7 - 4.7 g/dL 4.2 3.7 4.5  AST 0 - 40 IU/L $Remov'14 30 14  'ikRFpa$ ALT 0 - 32 IU/L $Remov'13 28 12  'pdbNgw$ Alk Phosphatase 39 - 117 IU/L 155(H) 99 124(H)  Total Bilirubin 0.0 - 1.2 mg/dL 0.6 1.1 0.7  Bilirubin, Direct 0.00 - 0.40 mg/dL - - 0.23    Lab Results  Component Value Date/Time   TSH 0.488 08/01/2018 03:42 PM   TSH 0.86 02/03/2017 12:07 PM   TSH 0.76 11/06/2015 09:59 AM    CBC Latest Ref Rng & Units 07/10/2020 01/01/2020 11/17/2018  WBC 3.4 - 10.8 x10E3/uL 9.1 7.1 8.1  Hemoglobin 11.1 - 15.9 g/dL 12.0 12.0 11.8  Hematocrit 34.0 - 46.6 % 36.8 35.8 36.1  Platelets 150 - 450 x10E3/uL  321 306 386    Lab Results  Component Value Date/Time   VD25OH 28 (L) 12/10/2009 02:01 PM   VD25OH 12 (L) 12/10/2008 11:19 AM    Clinical ASCVD: Yes  The 10-year ASCVD risk score Denman George DC Jr., et al., 2013) is: 27.7%   Values used to calculate the score:     Age: 12 years     Sex: Female     Is Non-Hispanic African American: No     Diabetic: Yes     Tobacco smoker: No     Systolic Blood Pressure: 110 mmHg     Is BP treated: Yes     HDL Cholesterol: 43 mg/dL     Total Cholesterol: 140 mg/dL    Depression screen [facility] 2/9 02/12/2021 12/08/2020 02/12/2020  Decreased Interest 0 0 0  Down, Depressed, Hopeless 0 0 0  PHQ - 2 Score 0 0 0   Altered sleeping 0 0 0  Tired, decreased energy 0 0 1  Change in appetite 0 0 0  Feeling bad or failure about yourself  0 0 0  Trouble concentrating 0 0 0  Moving slowly or fidgety/restless 0 0 0  Suicidal thoughts 0 0 0  PHQ-9 Score 0 - 1  Difficult doing work/chores Not difficult at all Not difficult at all Not difficult at all  Some recent data might be hidden    CHA2DS2/VAS Stroke Risk Points  Current as of 30 minutes ago     7 >= 2 Points: High Risk  1 - 1.99 Points: Medium Risk  0 Points: Low Risk    No Change      Details    This score determines the patient's risk of having a stroke if the  patient has atrial fibrillation.       Points Metrics  1 Has Congestive Heart Failure:  Yes    Current as of 30 minutes ago  1 Has Vascular Disease:  Yes    Current as of 30 minutes ago  1 Has Hypertension:  Yes    Current as of 30 minutes ago  2 Age:  47    Current as of 30 minutes ago  1 Has Diabetes:  Yes    Current as of 30 minutes ago  0 Had Stroke:  No  Had TIA:  No  Had Thromboembolism:  No    Current as of 30 minutes ago  1 Female:  Yes    Current as of 30 minutes ago     Social History   Tobacco Use  Smoking Status Former Smoker  . Packs/day: 1.50  . Years: 35.00  . Pack years: 52.50  . Types: Cigarettes  . Quit date: 08/24/2001  . Years since quitting: 19.5  Smokeless Tobacco Never Used   BP Readings from Last 3 Encounters:  03/12/21 110/64  03/06/21 112/64  02/16/21 116/64   Pulse Readings from Last 3 Encounters:  03/12/21 90  03/06/21 87  02/16/21 66   Wt Readings from Last 3 Encounters:  03/12/21 238 lb (108 kg)  03/06/21 236 lb 3.2 oz (107.1 kg)  02/16/21 235 lb 12.8 oz (107 kg)   BMI Readings from Last 3 Encounters:  03/12/21 38.41 kg/m  03/06/21 38.12 kg/m  02/16/21 38.06 kg/m    Assessment/Interventions: Review of patient past medical history, allergies, medications, health status, including review of consultants reports,  laboratory and other test data, was performed as part of comprehensive evaluation and provision of chronic care management services.  SDOH:  (Social Determinants of Health) assessments and interventions performed: No  SDOH Screenings   Alcohol Screen: Not on file  Depression (PHQ2-9): Low Risk   . PHQ-2 Score: 0  Financial Resource Strain: Low Risk   . Difficulty of Paying Living Expenses: Not very hard  Food Insecurity: Not on file  Housing: Not on file  Physical Activity: Inactive  . Days of Exercise per Week: 0 days  . Minutes of Exercise per Session: 0 min  Social Connections: Not on file  Stress: Not on file  Tobacco Use: Medium Risk  . Smoking Tobacco Use: Former Smoker  . Smokeless Tobacco Use: Never Used  Transportation Needs: Not on file    CCM Care Plan  Allergies  Allergen Reactions  . Tikosyn [Dofetilide]     Prolonged QT/QTc  . Zolpidem Other (See Comments)    Hallucinations, up walking around ? Hallucinations, up walking around  . Codeine Other (See Comments)  . Codeine Phosphate Nausea And Vomiting  . Methylprednisolone Other (See Comments)    Felt really weird w the high dose oral steroid, but tolerates low doses or oral steroid Felt really weird w the high dose oral steroid, but tolerates low doses or oral steroid    Medications Reviewed Today    Reviewed by Laurey Morale, MD (Physician) on 03/12/21 at 1146  Med List Status: <None>  Medication Order Taking? Sig Documenting Provider Last Dose Status Informant  atorvastatin (LIPITOR) 40 MG tablet [id number] Yes TAKE 1 TABLET BY MOUTH EVERYDAY AT BEDTIME Turner, Eber Hong, MD Taking Active   Cholecalciferol (VITAMIN D3) 2000 units capsule [id number] Yes Take 2,000 Units by mouth daily. [provider] Taking Active Self  clotrimazole-betamethasone (LOTRISONE) cream [id number] Yes Apply 1 application topically 2 (two) times daily. Evelina Bucy, DPM Taking Active Self           Med Note Kellie Simmering,  COURTNEY   Wed Aug 23, 2018  1:40 PM)    fexofenadine (ALLEGRA) 180 MG tablet [id number] Yes Take 180 mg by mouth daily as needed for allergies or rhinitis. [provider] Taking Active Self  fluticasone (FLONASE) 50 MCG/ACT nasal spray [id number] Yes Place 1 spray into both nostrils daily as needed for allergies.  [provider] Taking Active Self  furosemide (LASIX) 20 MG tablet [id number] Yes Take 1 tablet (20 mg total) by mouth daily. Thompson Grayer, MD Taking Active   gabapentin (NEURONTIN) 300 MG capsule [id number] Yes TAKE 2 CAPSULES BY MOUTH AT BEDTIME. Isaac Bliss, Rayford Halsted, MD Taking Active   glucose blood test strip [id number] Yes Use to check CBG AC and QHS. Marletta Lor, MD Taking Active Self  KLOR-CON M20 20 MEQ tablet [id number] Yes Take 1 tablet (20 mEq total) by mouth daily. Sueanne Margarita, MD Taking Active   letrozole [facility]) 2.5 MG tablet [id number] Yes Take 1 tablet (2.5 mg total) by mouth daily. Nicholas Lose, MD Taking Active   losartan (COZAAR) 25 MG tablet [id number] Yes Take 1 tablet (25 mg total) by mouth 2 (two) times daily. Sueanne Margarita, MD Taking Active   metFORMIN (GLUCOPHAGE) 500 MG tablet [id number] Yes TAKE 1 TABLET BY MOUTH TWICE A DAY WITH MEALS Isaac Bliss, Rayford Halsted, MD Taking Active   nebivolol (BYSTOLIC) 2.5 MG tablet [id number] Yes Take 1 tablet (2.5 mg total) by mouth daily. Sueanne Margarita, MD Taking Active   pantoprazole (PROTONIX) 40 MG tablet [id number] Yes Take 1 tablet (40 mg total) by mouth daily. Allred,  Jeneen Rinks, MD Taking Active   spironolactone (ALDACTONE) 25 MG tablet [id number] Yes Take 1 tablet (25 mg total) by mouth daily. Sueanne Margarita, MD Taking Active   traMADol Veatrice Bourbon) 50 MG tablet [id number] Yes Take 50 mg by mouth as needed for moderate pain. [provider] Taking Active   XARELTO 20 MG TABS tablet [id number] Yes TAKE 1 TABLET BY MOUTH EVERY DAY WITH SUPPER Sueanne Margarita, MD Taking Active            Patient Active Problem List   Diagnosis Date Noted  . Shortness of breath 07/17/2020  . Allergic rhinitis 07/17/2020  . Healthcare maintenance 07/17/2020  . Abnormal PFT 04/19/2019  . OSA (obstructive sleep apnea) 08/22/2018  . Trochanteric bursitis, right hip 07/14/2018  . Visit for monitoring Tikosyn therapy 03/21/2018  . Thyroid nodule 01/31/2018  . Candidiasis of skin 01/05/2018  . Menopausal syndrome 01/05/2018  . Postmenopausal bleeding 01/05/2018  . Postoperative breast asymmetry 12/02/2017  . Recurrent breast cancer, left ([place]) 10/27/2017  . 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, estrogen receptor positive ([place]) 09/19/2017  . History of left breast cancer 09/19/2017  . Malignant neoplasm of central portion of left breast in female, estrogen receptor positive ([place]) 09/01/2017  . Diabetes mellitus with peripheral vascular disease ([place]) 05/03/2016  . Diabetes mellitus with coincident hypertension ([place]) 05/03/2016  . 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 Excessive daytime sleepiness 03/12/2015  . Lumbar stenosis 03/19/2014  . Persistent atrial fibrillation ([place])   . DCM (dilated cardiomyopathy) ([place])   . Chronic combined systolic and diastolic CHF (congestive heart failure) ([place])   . Chronic anticoagulation- Xarelto 11/16/2013  . Family history of coronary artery disease 11/08/2013  . Coronary artery calcification seen on CAT scan 11/08/2013  . Spinal stenosis of lumbar region 12/18/2012  . BACK PAIN 01/08/2010  . Chronic back pain 02/28/2008  . Osteopenia 05/17/2007  . Dyslipidemia 05/03/2007  . Essential hypertension 05/03/2007  . COPD (chronic obstructive pulmonary disease) ([place]) 05/03/2007  . BREAST CANCER, HX OF 05/03/2007  . History of colonic polyps 05/03/2007    Immunization History  Administered Date(s) Administered  . Fluad Quad(high Dose 65+) 07/18/2019  . Influenza Split 09/29/2011,  07/20/2012  . Influenza Whole 08/14/2008  . Influenza, High Dose Seasonal PF 09/09/2017, 07/17/2020  . Influenza,inj,Quad PF,6+ Mos 10/23/2013, 08/20/2014, 08/14/2015, 08/27/2016, 08/09/2018  . PFIZER(Purple Top)SARS-COV-2 Vaccination 12/15/2019, 01/08/2020  . Pneumococcal Conjugate-13 10/23/2013  . Pneumococcal Polysaccharide-23 06/18/2011  . Tdap 06/18/2011  . Zoster, Live 07/20/2012    Conditions to be addressed/monitored:  Hypertension, Hyperlipidemia, Diabetes, Atrial Fibrillation, Heart Failure, COPD, Osteopenia and Osteoarthritis  Care Plan : CCM Pharmacy Care Plan  Updates made by Viona Gilmore, [place] since 03/24/2021 12:00 AM    Problem: Problem: Hypertension, Hyperlipidemia, Diabetes, Atrial Fibrillation, Heart Failure, COPD, Osteopenia and Osteoarthritis     Long-Range Goal: Patient-Specific Goal   Start Date: 03/19/2021  Expected End Date: 03/19/2022  This Visit's Progress: On track  Priority: High  Note:   Current Barriers:  . Unable to independently monitor therapeutic efficacy  Pharmacist Clinical Goal(s):  Marland Kitchen Patient will achieve adherence to monitoring guidelines and medication adherence to achieve therapeutic efficacy through collaboration with PharmD and provider.   Interventions: . 1:1 collaboration with Isaac Bliss, Rayford Halsted, MD regarding development and update of comprehensive plan of care as evidenced by provider attestation and co-signature . Inter-disciplinary care team collaboration (see longitudinal plan of care) . Comprehensive  medication review performed; medication list updated in electronic medical record  Hypertension (BP goal <140/90) -Controlled -Current treatment:  Losartan 25 mg 1 tablet twice daily  Spironolactone 25 mg 1 tablet daily  Furosemide 20 mg 1 tablet twice daily  Bystolic 2.5 mg 1 tablet daily -Medications previously tried: metoprolol, Valsartan/hctz  -Current home readings: not checking at home -Current dietary  habits: did not discuss -Current exercise habits: did not discuss -Denies hypotensive/hypertensive symptoms -Educated on Exercise goal of 150 minutes per week; Importance of home blood pressure monitoring; Proper BP monitoring technique; -Counseled to monitor BP at home weekly, document, and provide log at future appointments -Counseled on diet and exercise extensively Recommended to continue current medication Encouraged patient to purchase a BP monitor.  Hyperlipidemia: (LDL goal < 70) -Controlled -Current treatment:  Atorvastatin 40 mg 1 tablet daily at bedtime -Medications previously tried: simvastatin  -Current dietary patterns: did not discuss -Current exercise habits: did not discuss -Educated on Cholesterol goals;  Benefits of statin for ASCVD risk reduction; Importance of limiting foods high in cholesterol; -Counseled on diet and exercise extensively Recommended to continue current medication  Diabetes (A1c goal <7%) -Controlled -Current medications:  Metformin 500 mg 1 tablet twice daily with a meal -Medications previously tried: glipizide, Levemir   -Current home glucose readings . fasting glucose: not checking lately (checks once or twice in the morning) . post prandial glucose: n/a -Denies hypoglycemic/hyperglycemic symptoms -Current meal patterns:  . breakfast: did not discuss  . lunch: did not discuss  . dinner: did not discuss  . snacks: did not discuss  . drinks: did not discuss  -Current exercise: did not discuss  -Educated on A1c and blood sugar goals; Exercise goal of 150 minutes per week; Benefits of routine self-monitoring of blood sugar; -Counseled to check feet daily and get yearly eye exams -Counseled on diet and exercise extensively Recommended to continue current medication Collaborated with PCP about switching to an SGLT2 inhibitor  Atrial Fibrillation (Goal: prevent stroke and major bleeding) -Controlled -CHADSVASC: 7 -Current  treatment:  Rate control: Bystolic 2.5 mg 1 tablet daily  Anticoagulation: Xarelto 20 mg 1 tablet daily with supper -Medications previously tried: none -Home BP and HR readings: not checking at home  -Counseled on increased risk of stroke due to Afib and benefits of anticoagulation for stroke prevention; importance of adherence to anticoagulant exactly as prescribed; avoidance of NSAIDs due to increased bleeding risk with anticoagulants; -Recommended to continue current medication  Heart Failure (Goal: manage symptoms and prevent exacerbations) -Controlled -Last ejection fraction: 35-40% (Date: 05/2018) -HF type: Combined Systolic and Diastolic -NYHA Class: II (slight limitation of activity) -AHA HF Stage: C (Heart disease and symptoms present) -Current treatment:  Spironolactone 25 mg 1 tablet daily  Furosemide 20 mg 1 tablet in AM  Klor-con M20 1 tablet daily   Bystolic 2.5 mg 1 tablet daily  Losartan 25 mg 1 tablet twice daily -Medications previously tried: none  -Current home BP/HR readings: does not check at home -Current dietary habits: did not discuss -Current exercise habits: did not discuss -Educated on Benefits of medications for managing symptoms and prolonging life Importance of weighing daily; if you gain more than 3 pounds in one day or 5 pounds in one week, call cardiologist Importance of blood pressure control -Recommended to continue current medication   Osteopenia (Goal prevent fractures) -Uncontrolled -Last DEXA Scan: unable to locate record   T-Score femoral neck: n/a  T-Score total hip: n/a  T-Score lumbar spine: n/a  T-Score  forearm radius: n/a  10-year probability of major osteoporotic fracture: n/a  10-year probability of hip fracture: n/a -Patient is not a candidate for pharmacologic treatment -Current treatment  . Vitamin D3 2000 units 1 tablet daily -Medications previously tried: none  -Recommend [phone] units of vitamin D daily.  Recommend 1200 mg of calcium daily from dietary and supplemental sources. Recommend weight-bearing and muscle strengthening exercises for building and maintaining bone density. -Recommended repeat bone density scan Patient eats some cheese, not many almonds, no seeds, barely any milk, does not like cottage cheese or sour cream  Chronic pain (Goal: minimize pain) -Controlled -Current treatment   Gabapentin 300 mg 2 capsules at bedtime  Tramadol 50 mg 1 tablet every 6 hrs PRN for moderate pain -Medications previously tried: none  -Recommended to continue current medication  GERD (Goal: minimize symptoms of acid reflux/GERD) -Controlled -Current treatment  . Pantoprazole 40 mg 1 tablet daily -Medications previously tried: none  -Recommended to continue current medication  History of breast cancer (Goal: continued remission) -Controlled -Current treatment  . Letrozole 2.5 mg 1 tablet daily -Medications previously tried: none  -Recommended to continue current medication   Health Maintenance -Vaccine gaps: shingrix (patient declines as she already received Zostavax), patient reports getting COVID booster (unable to locate record) -Current therapy:   Amoxil 500 mg 4 tablets by mouth prior to dental procedure  Clotrimazole/betamethasone cream apply twice daily  Allegra 180 mg 1 tablet daily PRN for allergies/rhinitis  Fluconazole 150 mg 1 tablet once a week  Fluticasone 50 mcg/act 1 spray into both nostril PRN for allergies -Educated on Cost vs benefit of each product must be carefully weighed by individual consumer -Patient is satisfied with current therapy and denies issues -Recommended to continue current medication  Patient Goals/Self-Care Activities . Patient will:  - take medications as prescribed check glucose daily, document, and provide at future appointments check blood pressure weekly, document, and provide at future appointments target a minimum of 150 minutes  of moderate intensity exercise weekly  Follow Up Plan: Telephone follow up appointment with care management team member scheduled for: 6 months       Medication Assistance: None required.  Patient affirms current coverage meets needs.  Compliance/Adherence/Medication fill history: Care Gaps: None  Star-Rating Drugs: Atorvastatin 40 mg - [facility]09/[facility] Losartan 25 mg - last filled 02/03/21 for [facility] ds [facility] Metformin 500 mg - last filled 12/25/20 for 90 ds at [facility]  Patient's preferred pharmacy is:  [id number] - [place], [place] - [address] AT [address] [place] [address] Phone: [phone] Fax: [phone]  Uses pill box? Yes Pt endorses 100% compliance  We discussed: Current pharmacy is preferred with insurance plan and patient is satisfied with pharmacy services Patient decided to: Continue current medication management strategy  Care Plan and Follow Up Patient Decision:  Patient agrees to Care Plan and Follow-up.  Plan: Telephone follow up appointment with care management team member scheduled for:  6 months  [name], PharmD [place] Pharmacist [facility] [place] [phone]

## 2021-03-24 NOTE — Patient Instructions (Signed)
Hi Alvis,  It was great speaking with you again! Below is a summary of some of the topics we discussed.   Please reach out to me if you have any questions or need anything before our follow up!  Best, Maddie  [name], PharmD, [place] [facility]  Visit Information  Goals Addressed   None    Patient Care Plan: CCM Pharmacy Care Plan    Problem Identified: Problem: Hypertension, Hyperlipidemia, Diabetes, Atrial Fibrillation, Heart Failure, COPD, Osteopenia and Osteoarthritis     Long-Range Goal: Patient-Specific Goal   Start Date: 03/19/2021  Expected End Date: 03/19/2022  This Visit's Progress: On track  Priority: High  Note:   Current Barriers:  . Unable to independently monitor therapeutic efficacy  Pharmacist Clinical Goal(s):  Marland Kitchen Patient will achieve adherence to monitoring guidelines and medication adherence to achieve therapeutic efficacy through collaboration with PharmD and provider.   Interventions: . 1:1 collaboration with Isaac Bliss, Rayford Halsted, MD regarding development and update of comprehensive plan of care as evidenced by provider attestation and co-signature . Inter-disciplinary care team collaboration (see longitudinal plan of care) . Comprehensive medication review performed; medication list updated in electronic medical record  Hypertension (BP goal <140/90) -Controlled -Current treatment:  Losartan 25 mg 1 tablet twice daily  Spironolactone 25 mg 1 tablet daily  Furosemide 20 mg 1 tablet twice daily  Bystolic 2.5 mg 1 tablet daily -Medications previously tried: metoprolol, Valsartan/hctz  -Current home readings: not checking at home -Current dietary habits: did not discuss -Current exercise habits: did not discuss -Denies hypotensive/hypertensive symptoms -Educated on Exercise goal of 150 minutes per week; Importance of home blood pressure monitoring; Proper BP monitoring  technique; -Counseled to monitor BP at home weekly, document, and provide log at future appointments -Counseled on diet and exercise extensively Recommended to continue current medication Encouraged patient to purchase a BP monitor.  Hyperlipidemia: (LDL goal < 70) -Controlled -Current treatment:  Atorvastatin 40 mg 1 tablet daily at bedtime -Medications previously tried: simvastatin  -Current dietary patterns: did not discuss -Current exercise habits: did not discuss -Educated on Cholesterol goals;  Benefits of statin for ASCVD risk reduction; Importance of limiting foods high in cholesterol; -Counseled on diet and exercise extensively Recommended to continue current medication  Diabetes (A1c goal <7%) -Controlled -Current medications:  Metformin 500 mg 1 tablet twice daily with a meal -Medications previously tried: glipizide, Levemir   -Current home glucose readings . fasting glucose: not checking lately (checks once or twice in the morning) . post prandial glucose: n/a -Denies hypoglycemic/hyperglycemic symptoms -Current meal patterns:  . breakfast: did not discuss  . lunch: did not discuss  . dinner: did not discuss  . snacks: did not discuss  . drinks: did not discuss  -Current exercise: did not discuss  -Educated on A1c and blood sugar goals; Exercise goal of 150 minutes per week; Benefits of routine self-monitoring of blood sugar; -Counseled to check feet daily and get yearly eye exams -Counseled on diet and exercise extensively Recommended to continue current medication Collaborated with PCP about switching to an SGLT2 inhibitor  Atrial Fibrillation (Goal: prevent stroke and major bleeding) -Controlled -CHADSVASC: 7 -Current treatment:  Rate control: Bystolic 2.5 mg 1 tablet daily  Anticoagulation: Xarelto 20 mg 1 tablet daily with supper -Medications previously tried: none -Home BP and HR readings: not checking at home  -Counseled on increased risk of  stroke due to Afib and benefits of anticoagulation for stroke prevention; importance of adherence  to anticoagulant exactly as prescribed; avoidance of NSAIDs due to increased bleeding risk with anticoagulants; -Recommended to continue current medication  Heart Failure (Goal: manage symptoms and prevent exacerbations) -Controlled -Last ejection fraction: 35-40% (Date: 05/2018) -HF type: Combined Systolic and Diastolic -NYHA Class: II (slight limitation of activity) -AHA HF Stage: C (Heart disease and symptoms present) -Current treatment:  Spironolactone 25 mg 1 tablet daily  Furosemide 20 mg 1 tablet in AM  Klor-con M20 1 tablet daily   Bystolic 2.5 mg 1 tablet daily  Losartan 25 mg 1 tablet twice daily -Medications previously tried: none  -Current home BP/HR readings: does not check at home -Current dietary habits: did not discuss -Current exercise habits: did not discuss -Educated on Benefits of medications for managing symptoms and prolonging life Importance of weighing daily; if you gain more than 3 pounds in one day or 5 pounds in one week, call cardiologist Importance of blood pressure control -Recommended to continue current medication   Osteopenia (Goal prevent fractures) -Uncontrolled -Last DEXA Scan: unable to locate record   T-Score femoral neck: n/a  T-Score total hip: n/a  T-Score lumbar spine: n/a  T-Score forearm radius: n/a  10-year probability of major osteoporotic fracture: n/a  10-year probability of hip fracture: n/a -Patient is not a candidate for pharmacologic treatment -Current treatment  . Vitamin D3 2000 units 1 tablet daily -Medications previously tried: none  -Recommend [phone] units of vitamin D daily. Recommend 1200 mg of calcium daily from dietary and supplemental sources. Recommend weight-bearing and muscle strengthening exercises for building and maintaining bone density. -Recommended repeat bone density scan Patient eats some cheese, not  many almonds, no seeds, barely any milk, does not like cottage cheese or sour cream  Chronic pain (Goal: minimize pain) -Controlled -Current treatment   Gabapentin 300 mg 2 capsules at bedtime  Tramadol 50 mg 1 tablet every 6 hrs PRN for moderate pain -Medications previously tried: none  -Recommended to continue current medication  GERD (Goal: minimize symptoms of acid reflux/GERD) -Controlled -Current treatment  . Pantoprazole 40 mg 1 tablet daily -Medications previously tried: none  -Recommended to continue current medication  History of breast cancer (Goal: continued remission) -Controlled -Current treatment  . Letrozole 2.5 mg 1 tablet daily -Medications previously tried: none  -Recommended to continue current medication   Health Maintenance -Vaccine gaps: shingrix (patient declines as she already received Zostavax), patient reports getting COVID booster (unable to locate record) -Current therapy:   Amoxil 500 mg 4 tablets by mouth prior to dental procedure  Clotrimazole/betamethasone cream apply twice daily  Allegra 180 mg 1 tablet daily PRN for allergies/rhinitis  Fluconazole 150 mg 1 tablet once a week  Fluticasone 50 mcg/act 1 spray into both nostril PRN for allergies -Educated on Cost vs benefit of each product must be carefully weighed by individual consumer -Patient is satisfied with current therapy and denies issues -Recommended to continue current medication  Patient Goals/Self-Care Activities . Patient will:  - take medications as prescribed check glucose daily, document, and provide at future appointments check blood pressure weekly, document, and provide at future appointments target a minimum of 150 minutes of moderate intensity exercise weekly  Follow Up Plan: Telephone follow up appointment with care management team member scheduled for: 6 months       Patient verbalizes understanding of instructions provided today and agrees to view in  [place].  Telephone follow up appointment with pharmacy team member scheduled for:  Viona Gilmore, [facility]

## 2021-03-26 ENCOUNTER — Other Ambulatory Visit: Payer: Self-pay | Admitting: Internal Medicine

## 2021-03-26 ENCOUNTER — Other Ambulatory Visit: Payer: Self-pay

## 2021-03-26 ENCOUNTER — Other Ambulatory Visit: Payer: Medicare Other | Admitting: *Deleted

## 2021-03-26 DIAGNOSIS — I4819 Other persistent atrial fibrillation: Secondary | ICD-10-CM

## 2021-03-26 DIAGNOSIS — I10 Essential (primary) hypertension: Secondary | ICD-10-CM

## 2021-03-26 DIAGNOSIS — I420 Dilated cardiomyopathy: Secondary | ICD-10-CM

## 2021-03-26 DIAGNOSIS — E785 Hyperlipidemia, unspecified: Secondary | ICD-10-CM

## 2021-03-26 DIAGNOSIS — E1151 Type 2 diabetes mellitus with diabetic peripheral angiopathy without gangrene: Secondary | ICD-10-CM

## 2021-03-26 DIAGNOSIS — I5042 Chronic combined systolic (congestive) and diastolic (congestive) heart failure: Secondary | ICD-10-CM

## 2021-03-26 DIAGNOSIS — E119 Type 2 diabetes mellitus without complications: Secondary | ICD-10-CM | POA: Diagnosis not present

## 2021-03-26 LAB — CBC
Hematocrit: 34.4 % (ref 34.0–46.6)
Hemoglobin: 11.4 g/dL (ref 11.1–15.9)
MCH: 28.4 pg (ref 26.6–33.0)
MCHC: 33.1 g/dL (ref 31.5–35.7)
MCV: 86 fL (ref 79–97)
Platelets: 323 10*3/uL (ref 150–450)
RBC: 4.02 x10E6/uL (ref 3.77–5.28)
RDW: 13.6 % (ref 11.7–15.4)
WBC: 8.5 10*3/uL (ref 3.4–10.8)

## 2021-03-26 LAB — LIPID PANEL
Chol/HDL Ratio: 3.8 ratio (ref 0.0–4.4)
Cholesterol, Total: 136 mg/dL (ref 100–199)
HDL: 36 mg/dL — ABNORMAL LOW (ref >39)
LDL Chol Calc (NIH): 72 mg/dL (ref 0–99)
Triglycerides: 161 mg/dL — ABNORMAL HIGH (ref 0–149)
VLDL Cholesterol Cal: 28 mg/dL (ref 5–40)

## 2021-03-26 LAB — COMPREHENSIVE METABOLIC PANEL
ALT: 13 IU/L (ref 0–32)
AST: 13 IU/L (ref 0–40)
Albumin/Globulin Ratio: 1.9 (ref 1.2–2.2)
Albumin: 4.1 g/dL (ref 3.7–4.7)
Alkaline Phosphatase: 128 IU/L — ABNORMAL HIGH (ref 44–121)
BUN/Creatinine Ratio: 17 (ref 12–28)
BUN: 19 mg/dL (ref 8–27)
Bilirubin Total: 0.5 mg/dL (ref 0.0–1.2)
CO2: 21 mmol/L (ref 20–29)
Calcium: 9.8 mg/dL (ref 8.7–10.3)
Chloride: 101 mmol/L (ref 96–106)
Creatinine, Ser: 1.15 mg/dL — ABNORMAL HIGH (ref 0.57–1.00)
Globulin, Total: 2.2 g/dL (ref 1.5–4.5)
Glucose: 171 mg/dL — ABNORMAL HIGH (ref 65–99)
Potassium: 4.2 mmol/L (ref 3.5–5.2)
Sodium: 137 mmol/L (ref 134–144)
Total Protein: 6.3 g/dL (ref 6.0–8.5)
eGFR: 50 mL/min/{1.73_m2} — ABNORMAL LOW (ref >59)

## 2021-03-27 MED ORDER — ONETOUCH DELICA LANCETS 33G MISC: 1.0000 | Freq: Every day | 1 refills | Status: AC

## 2021-03-27 NOTE — Addendum Note (Signed)
Addended by: Westley Hummer B on: 03/27/2021 03:16 PM   Modules accepted: Orders

## 2021-03-30 ENCOUNTER — Encounter: Payer: Self-pay | Admitting: Internal Medicine

## 2021-03-30 ENCOUNTER — Other Ambulatory Visit: Payer: Self-pay

## 2021-03-30 ENCOUNTER — Ambulatory Visit: Payer: Medicare Other | Admitting: Internal Medicine

## 2021-03-30 VITALS — BP 128/74 | HR 71 | Ht 66.0 in | Wt 237.4 lb

## 2021-03-30 DIAGNOSIS — R0602 Shortness of breath: Secondary | ICD-10-CM

## 2021-03-30 DIAGNOSIS — I519 Heart disease, unspecified: Secondary | ICD-10-CM | POA: Diagnosis not present

## 2021-03-30 DIAGNOSIS — E669 Obesity, unspecified: Secondary | ICD-10-CM | POA: Diagnosis not present

## 2021-03-30 DIAGNOSIS — I4819 Other persistent atrial fibrillation: Secondary | ICD-10-CM | POA: Diagnosis not present

## 2021-03-30 NOTE — Patient Instructions (Addendum)
ICD-10-CM   1. Shortness of breath  R06.02   2. Chronic systolic dysfunction of left ventricle  I51.9   3. Obesity (BMI 30-39.9)  E66.9     Multifactorial shortness of breath including diastolic dysfunction, atrial fibrillation and weight and physical deconditioning.  Glad you are somewhat better after pulmonary rehabilitation ending April 2022.Marland Kitchen  No evidence of interstitial lung disease December 2020 CT chest.  However May 2022 pulmonary function test shows slight decline  Plan - Repeat high-resolution CT chest supine and prone to rule out new onset of interstitial lung disease  -We will call you with the results  -CT scan to be done in the next few to several weeks  Follow-up - 9 months for routine follow-up 15-minute slot   -If doing well will discharge you from follow-up   -If CT scan is abnormal we will have you come sooner

## 2021-03-30 NOTE — Progress Notes (Signed)
Patient ID: Cynthia Herrera, female    DOB: [birth year], 75 y.o.   MRN: [id number]  PCP Patient, No Pcp Per   HPI IOV 09/20/2018  Chief Complaint  Patient presents with  . Consult    referred by [name] MD for abnormal PFTs. pt notes some dyspnea with exertion that is at baseline.   75 year old obese lady with chronic systolic heart failure EF < 35% (aug 2019(, < 25 in may 2019, march 2015) and atrial fibrillation on chronic amiodarone therapy 200 mg a day for the last several years according to history.  She tells me that she has insidious onset of shortness of breath for the last few years.  It has been so gradual in onset she does not remember when but she believes it is a few years.  Definitely not several years.  She feels its insidious worsening of shortness of breath.  Initially she denied any associated cough but then later she said for the last several months has had some cough occasional with some yellow sputum.  But definitely her shortness of breath is progressive very mildly and present on exertion relieved by rest no associated chest pain.  No orthopnea proximal nocturnal dyspnea or worsening edema or change in weight.  No fever or chills.  She had pulmonary function test that showed isolated reduction in diffusion capacity.  I personally visualized the scan.  Rest of lab work-up as mentioned below.  Of note, in the last 3 weeks she has had acute worsening of chronic back pain but she believes this is unrelated to her dyspnea.   Exam nitric oxide today in the office: 09/20/2018 - 21 ppb and normal  She has had 2 CT scans of the chest 1 in 2014 December that I personally visualized and shows diffuse groundglass opacities.  She has another CT scan in #2018 there is more recent that I personally visualized and this shows essentially clear lung fields except scattered lung nodules.  She had a cardiac stress test June 2019 that is reported as low risk  Echocardiogram August  2019 with chronic systolic heart failure with EF 35% and grade 1 diastolic dysfunction  Blood work October 2019 with a creatinine 1.42 mg percent and a low GFR of 36 and a hemoglobin of 12.5 g% that is all stable in March 2019   OV 11/02/2018  Subjective:  Patient ID: Cynthia Herrera, female , DOB: 1946/06/18 , age 75 y.o. , MRN: [id number] , ADDRESS: [address] [place] [facility] [address]   11/02/2018 -   Chief Complaint  Patient presents with  . Follow-up    Pt states she has been doing good since last visit. States her breathing is about the same. Has an occ cough from allergies.     HPI [name] 75 y.o. -has history of amiodarone therapy, radiation therapy and previous smoking.  She was referred to [place] for abnormal pulmonary function test low DLCO.  We did a high-resolution CT chest and this showed possibility of early ILD [indeterminate pattern for UIP which means less than 40% chance that this is indeed pathological UIP/IPF].  We then subjected autoimmune vasculitis panel early January 2020 and this is returned normal.  She is here to discuss all the test results.   [facility] Comprehensive ILD Questionnaire  Symptoms: She is very categorical that she has had chronic systolic heart failure for many years and ejection fraction recently has improved but the shortness of breath is  only present for the last several months.  Insidious onset and gradually getting worse in the face of improving ejection fraction.  Currently has level 5 dyspnea for walking up stairs and walking up a hill.  And level for dyspnea for walking on level at her own pace.  At rest she has level 1 dyspnea and for ADLs she has level 2 dyspnea.  She denies any cough or wheezing or any other symptoms   Past Medical History : She has chronic systolic heart failure.  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  Denies any asthma or autoimmune or vasculitis.  Denies sleep apnea.  Denies  any thyroid disease stroke or seizures.  Denies hepatitis.   ROS: Positive for fatigue but no arthralgia.  Over the last several months she is lost 25 pounds.  No nausea vomiting or heartburn or rash or ulcers   FAMILY HISTORY of LUNG DISEASE: No family for pulmonary fibrosis but dad and sister had COPD   EXPOSURE HISTORY: She did smoke from 1968 2000 to 1-1/2 packs a day.  She is lived in the same house.  Does not vape or use marijuana or cocaine.   HOME and HOBBY DETAILS : [place].  Age of the home is 81 years.  Over a year ago with the rains she had to like extensive water standing under the house but there is no mold exposure.  Does not use CPAP.  Does not use neb machine.  Does use a steam iron but does not have a mold.  No fountain inside the house no pet birds.  No use of feather blankets or pillows.  No mold in the [facility] duct.  Does not play any wind instruments.  Does do gardening but she uses potted soil.  The soil is never damp.  No damp exposure with gardening   OCCUPATIONAL HISTORY (122 questions) : Positive for gardening but no mold exposure.  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  In the 1970s and 1980s worked at  [employer] and was exposed to [employer].  She is worked in a dusty environment with furniture for 20 years   [place] (27 items): Got radiation therapy to the breast between 2002 and 2003.  Got cancer chemotherapy at that time.  Has been on amiodarone now for the last few years        Results for NASHIA, REMUS (MRN [id number]) as of 09/20/2018 12:08  Ref. Range 08/24/2018 08:49  FVC-Pre Latest Units: L 2.77  FVC-%Pred-Pre Latest Units: % 87  FEV1-Pre Latest Units: L 2.28  FEV1-%Pred-Pre Latest Units: % 95  Pre FEV1/FVC ratio Latest Units: % 83  FEV1FVC-%Pred-Pre Latest Units: % 109   Results for MAYLYNN, ORZECHOWSKI (MRN [id number]) as of 09/20/2018 12:08  Ref. Range  08/24/2018 08:49  TLC Latest Units: L 4.56  TLC % pred Latest Units: % 85   Results for AMRIT, ERCK (MRN [id number]) as of 09/20/2018 12:08  Ref. Range 08/24/2018 08:49  DLCO unc Latest Units: ml/min/mmHg 15.65  DLCO unc % pred Latest Units: % 58    Simple office walk 250 feet x  3 laps goal with forehead probe 11/02/2018   O2 used Room air  Number laps completed 2 and stopped due to dyspnea, and back pain  Comments about pace nomral  Resting Pulse Ox/HR 99% and 58/min  Final Pulse Ox/HR  98% and 93/min  Desaturated </= 88% no  Desaturated <= 3% points no  Got Tachycardic >/= 90/min yes  Symptoms at end of test dyspne and back pain  Miscellaneous comments Stopped at 2 laps       CT chest high resolution 1210/19 IMPRESSION: 1. Extremely subtle ground-glass attenuation noted throughout the lungs bilaterally. This is nonspecific. The possibility of early interstitial lung disease is not entirely excluded. If there's persistent clinical concern for interstitial lung disease, a repeat high-resolution chest CT would be recommended in 12 months to assess for temporal changes in the appearance of the lung parenchyma. 2. No imaging stigmata of amiodarone induced pulmonary toxicity. 3. Mild air trapping, indicative of mild small airways disease. 4. Aortic atherosclerosis, in addition to left main and 3 vessel coronary artery disease. Assessment for potential risk factor modification, dietary therapy or pharmacologic therapy may be warranted, if clinically indicated. 5. Splenomegaly. 6. New postoperative changes of left modified radical mastectomy with postoperative fluid collection associated with the left pectoralis musculature measuring 2.6 x 10.4 cm, likely a postoperative seroma.  Aortic Atherosclerosis (ICD10-I70.0).   Electronically Signed   By: Vinnie Langton M.D.   On: 10/03/2018 20:23    Results for JALEE, SAINE (MRN [id number]) as of 11/02/2018  12:17  Ref. Range 10/26/2018 14:06 10/26/2018 14:15  ASPERGILLUS FUMIGATUS Latest Ref Range: NEGATIVE  NEGATIVE   Pigeon Serum Latest Ref Range: NEGATIVE  NEGATIVE   Anti Nuclear Antibody(ANA) Latest Ref Range: NEGATIVE  NEGATIVE   Angiotensin-Converting Enzyme Latest Ref Range: 9 - 67 U/L 75 (H)   Anti JO-1 Latest Ref Range: 0.0 - 0.9 AI <0.2   CENTROMERE AB SCREEN Latest Ref Range: <1.0 NEG AI <5.7 NEG   Cyclic Citrullin Peptide Ab Latest Units: UNITS <16   ds DNA Ab Latest Units: IU/mL 1   ENA RNP Ab Latest Ref Range: 0.0 - 0.9 AI <0.2   Myeloperoxidase Abs Latest Units: AI <1.0   Serine Protease 3 Latest Units: AI <1.0   RA Latex Turbid. Latest Ref Range: <14 IU/mL <14   ENA SM Ab Ser-aCnc Latest Ref Range: <1.0 NEG AI  <1.0 NEG  SSA (Ro) (ENA) Antibody, IgG Latest Ref Range: <1.0 NEG AI <1.0 NEG   SSB (La) (ENA) Antibody, IgG Latest Ref Range: <1.0 NEG AI <1.0 NEG   Scleroderma (Scl-70) (ENA) Antibody, IgG Latest Ref Range: <1.0 NEG AI <1.0 NEG     ROS - per HPI  OV 08/21/2020  Subjective:  Patient ID: Cynthia Herrera, female , DOB: 1946/03/23 , age 69 y.o. , MRN: [id number] , ADDRESS: [address] [place] [address] PCP Isaac Bliss, Rayford Halsted, MD Patient Care Team: Isaac Bliss, Rayford Halsted, MD as PCP - General (Internal Medicine) Sueanne Margarita, MD as PCP - Cardiology (Cardiology) Audery Amel Sharma Covert, [facility] (Inactive) as Pharmacist (Pharmacist)  This Provider for this visit: Treatment Team:  Attending Provider: Brand Males, MD    08/21/2020 -   Chief Complaint  Patient presents with  . Follow-up    shortness of breath with activity   follow-up dyspnea with concern for interstitial lung disease in January 2020   HPI SUSANA DUELL 75 y.o. -  I personally not seen her since January 2020.  At the time ILD was a concern because of amiodarone therapy and radiation therapy.  However with follow-up and treatment of congestive heart failure and stopping  amiodarone her pulmonary function test improved by June 2020 it was normal.  December  2020 high-resolution CT did not show any pulmonary fibrosis.  She now returns for follow-up because she continues have persistent dyspnea with exertion relieved by rest.  She does not have any wheezing.  She says in the summer months it was worse but with the onset of fall it is better.  Early morning she has mucus congestion from postnasal drip but otherwise okay.  Overall she is stable.  She had echocardiogram in October 2021 and her ejection fraction has improved although still low.  She is on respiration recently chest x-ray is clear.  And per her report walking desaturation test in the office was normal.  There is no wheezing or cough orthopnea proximal nocturnal dyspnea edema.  There is no weight loss.  She is obese.    PFT Results Latest Ref Rng & Units 04/18/2019 08/24/2018 12/05/2014  FVC-Pre L 2.81 2.77 2.76  FVC-Predicted Pre % 89 87 81  FVC-Post L - 2.71 2.72  FVC-Predicted Post % - 85 80  Pre FEV1/FVC % % 84 83 85  Post FEV1/FCV % % - 86 84  FEV1-Pre L 2.35 2.28 2.34  FEV1-Predicted Pre % 99 95 90  FEV1-Post L - 2.32 2.30  DLCO uncorrected ml/min/mmHg 17.52 15.65 18.54  DLCO UNC% % 85 58 65  DLVA Predicted % 110 73 82  TLC L - 4.56 -  TLC % Predicted % - 85 -  RV % Predicted % - 84 -   IMPRESSION: CT chest Dec 2020  1. No evidence of fibrotic interstitial lung disease. No evidence of amiodarone toxicity. 2. Air trapping is indicative of small airways disease. 3. Aortic atherosclerosis (ICD10-170.0). Coronary artery calcification. 4. Enlarged pulmonic trunk, indicative of pulmonary arterial hypertension.   Electronically Signed   By: Lorin Picket M.D.   On: 10/03/2019 15:00     Echo 07/30/20  IMPRESSIONS    1. Left ventricular ejection fraction, by estimation, is 45 to 50%. Left  ventricular ejection fraction by 3D volume is 45 %. The left ventricle has  mildly decreased  function. The left ventricle has no regional wall motion  abnormalities. Left ventricular  diastolic parameters are consistent with Grade I diastolic dysfunction  (impaired relaxation). Elevated left atrial pressure.  2. Right ventricular systolic function is normal. The right ventricular  size is normal. Tricuspid regurgitation signal is inadequate for assessing  PA pressure.  3. A small pericardial effusion is present. The pericardial effusion is  circumferential. There is no evidence of cardiac tamponade.  4. The mitral valve is grossly normal. Trivial mitral valve  regurgitation. No evidence of mitral stenosis.  5. The aortic valve is tricuspid. Aortic valve regurgitation is not  visualized. No aortic stenosis is present.  6. The inferior vena cava is normal in size with greater than 50%  respiratory variability, suggesting right atrial pressure of 3 mmHg.   Comparison(s): No significant change from prior study  OV 03/30/2021  Subjective:  Patient ID: Cynthia Herrera, female , DOB: 1946/08/14 , age 89 y.o. , MRN: [id number] , ADDRESS: [id number] [place] [address] PCP Isaac Bliss, Rayford Halsted, MD Patient Care Team: Isaac Bliss, Rayford Halsted, MD as PCP - General (Internal Medicine) Sueanne Margarita, MD as PCP - Cardiology (Cardiology) Viona Gilmore, [facility] as Pharmacist (Pharmacist)  This Provider for this visit: Treatment Team:  Attending Provider: Brand Males, MD    03/30/2021 -   Chief Complaint  Patient presents with  . Follow-up    Pt states she has  been doing okay since last visit. States her breathing is about the same as she still has increased SOB mainly with activities.     HPI MARJORIA MANCILLAS 75 y.o. -returns for monitoring of her shortness of breath.  Since last seeing me in the fall 2021 she has completed cardiac/pulmonary rehabilitation.  This was completed end of April 2022.  Her last 6-minute walk test was in February 10, 2021 and she did  not desaturate.  She says overall her dyspnea is some better but is still present.  She also saw Dr. Radford Pax in October 2021 and ejection fraction though low is noted to be improved.  She saw Dr. Rayann Heman her electrophysiologist for persistent A. fib.  He believes A. fib is well controlled post ablation.  This was end of April 2022.  She had some dizziness and he reduced her Lasix.  Hypertension was deemed stable.  Noticed that she was using dental appliance for OSA.  She had a reassuring visit with Dr. Golden Hurter mid May 2022.  This was her cardiologist.  Also reassuring visit mid May 2022 with primary care.  Her BMI continues to be 38.4 and she is obese.  Her pulmonary function test shows sudden decline.  Although the decline itself is mild.  She is surprised by this.  She continues to be off amiodarone.     CT Chest data  No results found.    PFT  PFT Results Latest Ref Rng & Units 02/23/2021 04/18/2019 08/24/2018 12/05/2014  FVC-Pre L 2.45 2.81 2.77 2.76  FVC-Predicted Pre % 79 89 87 81  FVC-Post L 2.60 - 2.71 2.72  FVC-Predicted Post % 84 - 85 80  Pre FEV1/FVC % % 78 84 83 85  Post FEV1/FCV % % 82 - 86 84  FEV1-Pre L 1.91 2.35 2.28 2.34  FEV1-Predicted Pre % 82 99 95 90  FEV1-Post L 2.13 - 2.32 2.30  DLCO uncorrected ml/min/mmHg 16.04 17.52 15.65 18.54  DLCO UNC% % 78 85 58 65  DLCO corrected ml/min/mmHg 16.04 - - -  DLCO COR %Predicted % 78 - - -  DLVA Predicted % 97 110 73 82  TLC L 4.59 - 4.56 -  TLC % Predicted % 85 - 85 -  RV % Predicted % 88 - 84 -       has a past medical history of Arthritis, Breast cancer, left breast ([place]) (2002), Breast cancer, left breast ([place]) (07/14/2017), Chronic back pain, Chronic systolic CHF (congestive heart failure) (HCC), Complication of anesthesia, COPD (chronic obstructive pulmonary disease) (HCC), DCM (dilated cardiomyopathy) (HCC), Dyspnea, Gallstones, GERD (gastroesophageal reflux disease), History of bronchitis, Hyperlipidemia,  Hypertension, Joint pain, Muscle spasm, Nocturia, OSA (obstructive sleep apnea) (08/22/2018), Osteopenia, Persistent atrial fibrillation (HCC), Personal history of colonic polyps, Pneumonia, Seasonal allergies, Thyroid nodule, Type II diabetes mellitus ([place]), and Vitamin D deficiency.   reports that she quit smoking about 19 years ago. Her smoking use included cigarettes. She has a 52.50 pack-year smoking history. She has never used smokeless tobacco.  Past Surgical History:  Procedure Laterality Date  . ATRIAL FIBRILLATION ABLATION N/A 12/12/2018   Procedure: ATRIAL FIBRILLATION ABLATION;  Surgeon: Thompson Grayer, MD;  Location: [place] [facility];  Service: Cardiovascular;  Laterality: N/A;  . AXILLARY LYMPH NODE DISSECTION Left 10/2001   persistent intramammary node Archie Endo 03/09/2011  . BACK SURGERY    . BREAST BIOPSY Left 2002  . BREAST BIOPSY Right 06/2017   "node bx was negative"  . BREAST BIOPSY Left 06/2017   "  positive for cancer"  . BREAST LUMPECTOMY WITH RADIOACTIVE SEED LOCALIZATION Right 08/15/2017   Procedure: RIGHT BREAST LUMPECTOMY WITH RADIOACTIVE SEED LOCALIZATION;  Surgeon: Jovita Kussmaul, MD;  Location: [place];  Service: General;  Laterality: Right;  . CARDIAC CATHETERIZATION  2015  . CARDIOVERSION N/A 11/12/2013   Procedure: CARDIOVERSION;  Surgeon: Thayer Headings, MD;  Location: [facility] ENDOSCOPY;  Service: Cardiovascular;  Laterality: N/A;  10:08  Dr. Marissa Nestle, anesthesia present, [place]   60mg ,  propofol 50mg , IV for elective cardioversion....Dr. Cathie Olden delievered synch 120 joules with successful cardioversion to NSR  . CARDIOVERSION N/A 11/12/2013   Procedure: CARDIOVERSION;  Surgeon: Thayer Headings, MD;  Location: [place];  Service: Cardiovascular;  Laterality: N/A;  . CARDIOVERSION N/A 11/14/2013   Procedure: CARDIOVERSION (BEDSIDE);  Surgeon: Larey Dresser, MD;  Location: [place];  Service: Cardiovascular;  Laterality: N/A;  . CARDIOVERSION N/A 02/22/2018   Procedure:  CARDIOVERSION;  Surgeon: Sueanne Margarita, MD;  Location: [facility] ENDOSCOPY;  Service: Cardiovascular;  Laterality: N/A;  . CATARACT EXTRACTION W/ INTRAOCULAR LENS  IMPLANT, BILATERAL Bilateral ~ 2016  . COLONOSCOPY  2003, September 2008, April 01, 2011   adenomas 03 and 08, polyp 12, diverticulosis  . COLONOSCOPY WITH PROPOFOL N/A 11/16/2016   Procedure: COLONOSCOPY WITH PROPOFOL;  Surgeon: Gatha Mayer, MD;  Location: [facility] ENDOSCOPY;  Service: Endoscopy;  Laterality: N/A;  . JOINT REPLACEMENT    . LAPAROSCOPIC CHOLECYSTECTOMY  2004  . LEFT AND RIGHT HEART CATHETERIZATION WITH CORONARY ANGIOGRAM N/A 02/01/2014   Procedure: LEFT AND RIGHT HEART CATHETERIZATION WITH CORONARY ANGIOGRAM;  Surgeon: Sueanne Margarita, MD;  Location: [place] CATH LAB;  Service: Cardiovascular;  Laterality: N/A;  . LUMBAR LAMINECTOMY/DECOMPRESSION MICRODISCECTOMY N/A 03/19/2014   Procedure: LUMBAR LAMINECTOMY/DECOMPRESSION MICRODISCECTOMY 4 LEVEL;  Surgeon: Kristeen Miss, MD;  Location: [place] NEURO ORS;  Service: Neurosurgery;  Laterality: N/A;  L1-2 L2-3 L3-4 L4-5 Laminectomy/Foraminotomy  . MASS EXCISION Left 08/15/2017   Procedure: EXCISION OF LEFT BREAST MASS;  Surgeon: Jovita Kussmaul, MD;  Location: [place];  Service: General;  Laterality: Left;  Marland Kitchen MASTECTOMY Left 10/27/2017  . MASTECTOMY PARTIAL / LUMPECTOMY W/ AXILLARY LYMPHADENECTOMY  05/08/2001   Archie Endo 03/09/2011  . PORT-A-CATH REMOVAL  2003  . PORTA CATH INSERTION  2002  . TEE WITHOUT CARDIOVERSION N/A 11/12/2013   Procedure: TRANSESOPHAGEAL ECHOCARDIOGRAM (TEE);  Surgeon: Thayer Headings, MD;  Location: [place];  Service: Cardiovascular;  Laterality: N/A;  pt b/p low, pt buccal membranes very dry, lips scapped, pt c/o thirst. NPO since MN and iv FLUIDS TOTAL INFUSING AT TOTAL 20ML HR....  Dr. Cathie Olden order allow NS to bolus during procedure....very dry,NS bolus 250 ml total..pt responding well to meds..  . TOTAL KNEE ARTHROPLASTY Left 2006  . TOTAL KNEE ARTHROPLASTY Right  05/10/2016   Procedure: RIGHT TOTAL KNEE ARTHROPLASTY;  Surgeon: Gaynelle Arabian, MD;  Location: [facility];  Service: Orthopedics;  Laterality: Right;  With adductor block  . TOTAL MASTECTOMY Left 10/27/2017   Procedure: LEFT MASTECTOMY;  Surgeon: Jovita Kussmaul, MD;  Location: [place];  Service: General;  Laterality: Left;  . TUBAL LIGATION      Allergies  Allergen Reactions  . Tikosyn [Dofetilide]     Prolonged QT/QTc  . Zolpidem Other (See Comments)    Hallucinations, up walking around ? Hallucinations, up walking around  . Codeine Other (See Comments)  . Codeine Phosphate Nausea And Vomiting  . Methylprednisolone Other (See Comments)    Felt really weird w the high dose  oral steroid, but tolerates low doses or oral steroid Felt really weird w the high dose oral steroid, but tolerates low doses or oral steroid    Immunization History  Administered Date(s) Administered  . Fluad Quad(high Dose 65+) 07/18/2019  . Influenza Split 09/29/2011, 07/20/2012  . Influenza Whole 08/14/2008  . Influenza, High Dose Seasonal PF 09/09/2017, 07/17/2020  . Influenza,inj,Quad PF,6+ Mos 10/23/2013, 08/20/2014, 08/14/2015, 08/27/2016, 08/09/2018  . PFIZER(Purple Top)SARS-COV-2 Vaccination 12/15/2019, 01/08/2020  . Pneumococcal Conjugate-13 10/23/2013  . Pneumococcal Polysaccharide-23 06/18/2011  . Tdap 06/18/2011  . Zoster, Live 07/20/2012    Family History  Problem Relation Age of Onset  . Dementia Sister   . Diabetes Sister   . Alzheimer's disease Sister   . Diabetes Brother        x 2  . Heart disease Brother   . Irritable bowel syndrome Brother   . Liver cancer Mother   . Diabetes Mother   . Pancreatic cancer Mother   . Diabetes Father   . Heart disease Father      Current Outpatient Medications:  .  atorvastatin (LIPITOR) 40 MG tablet, TAKE 1 TABLET BY MOUTH EVERYDAY AT BEDTIME, Disp: 90 tablet, Rfl: 2 .  Cholecalciferol (VITAMIN D3) 2000 units capsule, Take 2,000 Units by mouth  daily., Disp: , Rfl:  .  clotrimazole-betamethasone (LOTRISONE) cream, Apply 1 application topically 2 (two) times daily., Disp: 30 g, Rfl: 0 .  fexofenadine (ALLEGRA) 180 MG tablet, Take 180 mg by mouth daily as needed for allergies or rhinitis., Disp: , Rfl:  .  fluticasone (FLONASE) 50 MCG/ACT nasal spray, Place 1 spray into both nostrils daily as needed for allergies. , Disp: , Rfl:  .  furosemide (LASIX) 20 MG tablet, Take 1 tablet (20 mg total) by mouth daily., Disp: 90 tablet, Rfl: 3 .  gabapentin (NEURONTIN) 300 MG capsule, TAKE 2 CAPSULES BY MOUTH AT BEDTIME., Disp: 180 capsule, Rfl: 1 .  glucose blood test strip, Use to check CBG AC and QHS., Disp: 200 each, Rfl: 12 .  KLOR-CON M20 20 MEQ tablet, Take 1 tablet (20 mEq total) by mouth daily., Disp: 90 tablet, Rfl: 2 .  letrozole (FEMARA) 2.5 MG tablet, Take 1 tablet (2.5 mg total) by mouth daily., Disp: 90 tablet, Rfl: 3 .  losartan (COZAAR) 25 MG tablet, Take 1 tablet (25 mg total) by mouth 2 (two) times daily., Disp: 180 tablet, Rfl: 2 .  metFORMIN (GLUCOPHAGE) 500 MG tablet, TAKE 1 TABLET BY MOUTH TWICE A DAY WITH MEALS, Disp: 180 tablet, Rfl: 0 .  nebivolol (BYSTOLIC) 2.5 MG tablet, Take 1 tablet (2.5 mg total) by mouth daily., Disp: 90 tablet, Rfl: 2 .  OneTouch Delica Lancets 79K MISC, 1 each by Does not apply route daily., Disp: 100 each, Rfl: 1 .  pantoprazole (PROTONIX) 40 MG tablet, Take 1 tablet (40 mg total) by mouth daily., Disp: 90 tablet, Rfl: 3 .  spironolactone (ALDACTONE) 25 MG tablet, Take 1 tablet (25 mg total) by mouth daily., Disp: 90 tablet, Rfl: 2 .  traMADol (ULTRAM) 50 MG tablet, Take 50 mg by mouth as needed for moderate pain., Disp: , Rfl:  .  XARELTO 20 MG TABS tablet, TAKE 1 TABLET BY MOUTH EVERY DAY WITH SUPPER, Disp: 30 tablet, Rfl: 10      Objective:   Vitals:   03/30/21 1522  BP: 128/74  Pulse: 71  SpO2: 99%  Weight: 237 lb 6.4 oz (107.7 kg)  Height: 5\' 6"  (1.676 m)  Estimated body mass  index is 38.32 kg/m as calculated from the following:   Height as of this encounter: 5\' 6"  (1.676 m).   Weight as of this encounter: 237 lb 6.4 oz (107.7 kg).  @WEIGHTCHANGE @  [employer]   03/30/21 1522  Weight: 237 lb 6.4 oz (107.7 kg)     Physical Exam   General: No distress. obese Neuro: Alert and Oriented x 3. GCS 15. Speech normal Psych: Pleasant Resp:  Barrel Chest - no.  Wheeze - no, Crackles - no, No overt respiratory distress CVS: Normal heart sounds. Murmurs - no Ext: Stigmata of Connective Tissue Disease - no HEENT: Normal upper airway. PEERL +. No post nasal drip        Assessment:       ICD-10-CM   1. Shortness of breath  R06.02   2. Chronic systolic dysfunction of left ventricle  I51.9   3. Obesity (BMI 30-39.9)  E66.9   4. Persistent atrial fibrillation ([place])  I48.19        Plan:     Patient Instructions     ICD-10-CM   1. Shortness of breath  R06.02   2. Chronic systolic dysfunction of left ventricle  I51.9   3. Obesity (BMI 30-39.9)  E66.9     Multifactorial shortness of breath including diastolic dysfunction, atrial fibrillation and weight and physical deconditioning.  Glad you are somewhat better after pulmonary rehabilitation ending April 2022.Marland Kitchen  No evidence of interstitial lung disease December 2020 CT chest.  However May 2022 pulmonary function test shows slight decline  Plan - Repeat high-resolution CT chest supine and prone to rule out new onset of interstitial lung disease  -We will call you with the results  -CT scan to be done in the next few to several weeks  Follow-up - 9 months for routine follow-up 15-minute slot   -If doing well will discharge you from follow-up   -If CT scan is abnormal we will have you come sooner      SIGNATURE    Dr. Brand Males, M.D., F.C.C.P,  Pulmonary and Critical Care Medicine Staff Physician, [place] Director - Interstitial Lung Disease  Program  Pulmonary  [facility] at [place], [place], [address]  Pager: [phone], If no answer or between  15:00h - 7:00h: call [phone] Telephone: [phone]  4:05 PM 03/30/2021

## 2021-04-07 ENCOUNTER — Ambulatory Visit (INDEPENDENT_AMBULATORY_CARE_PROVIDER_SITE_OTHER)
Admission: RE | Admit: 2021-04-07 | Discharge: 2021-04-07 | Disposition: A | Discharge status: Home / Self Care | Payer: Medicare Other | Source: Ambulatory Visit | Attending: Internal Medicine | Admitting: Internal Medicine

## 2021-04-07 ENCOUNTER — Other Ambulatory Visit: Payer: Self-pay

## 2021-04-07 DIAGNOSIS — R0602 Shortness of breath: Secondary | ICD-10-CM

## 2021-04-19 ENCOUNTER — Telehealth: Payer: Self-pay | Admitting: Internal Medicine

## 2021-04-19 NOTE — Telephone Encounter (Signed)
Her PFT had ddeclined. So we got CT chest and they are reporting UL + nodularoty. Also see before in 2020 but unclear if worse on CT  Plan  - do repeat ACE blood test, and new HP panel and new Quant gold  -  For radiology addendum:   - please call radiologist assistant line [phone] and specify Cynthia Herrera , 10-Jul-1946 and [phone] - mention imaging type ct chest and date dec 2022 v June 2022 - request addendum for purpose of progression of UL diesease or not   Please send phone message back when done  Thanks    SIGNATURE    Dr. Brand Males, M.D., F.C.C.P,  Pulmonary and Critical Care Medicine Staff Physician, [place] Director - Interstitial Lung Disease  Program  Pulmonary [facility] at [place], [place], [address]  Pager: [phone], If no answer  OR between  19:00-7:00h: page [phone] Telephone (clinical office): [phone] Telephone (research): [phone]  1:30 PM 04/19/2021

## 2021-04-21 ENCOUNTER — Other Ambulatory Visit: Payer: Self-pay

## 2021-04-21 DIAGNOSIS — R0602 Shortness of breath: Secondary | ICD-10-CM

## 2021-04-21 NOTE — Telephone Encounter (Signed)
Attempted to call pt but unable to reach. Left message for her to return call. 

## 2021-04-21 NOTE — Telephone Encounter (Signed)
[facility] and spoke with Diane about the addendum that is being requested. Diane stated that she would send this over to Dr. Rosario Jacks as she is the radiologist who read it prior. Dr. Rosario Jacks will be back in office 7/6.  Routing to MR as an Pharmacist, hospital.

## 2021-04-24 ENCOUNTER — Other Ambulatory Visit: Payer: Medicare Other

## 2021-04-24 DIAGNOSIS — R0602 Shortness of breath: Secondary | ICD-10-CM

## 2021-04-28 LAB — QUANTIFERON-TB GOLD PLUS
Mitogen-NIL: 1.28 IU/mL
NIL: 0.03 IU/mL
QuantiFERON-TB Gold Plus: NEGATIVE
TB1-NIL: 0 IU/mL
TB2-NIL: <0 IU/mL

## 2021-04-28 LAB — ANGIOTENSIN CONVERTING ENZYME: Angiotensin-Converting Enzyme: 37 U/L (ref 9–67)

## 2021-04-30 ENCOUNTER — Other Ambulatory Visit: Payer: Self-pay | Admitting: Cardiology

## 2021-04-30 ENCOUNTER — Other Ambulatory Visit: Payer: Self-pay | Admitting: Internal Medicine

## 2021-04-30 ENCOUNTER — Other Ambulatory Visit: Payer: Self-pay | Admitting: Podiatry

## 2021-04-30 DIAGNOSIS — E1151 Type 2 diabetes mellitus with diabetic peripheral angiopathy without gangrene: Secondary | ICD-10-CM

## 2021-04-30 NOTE — Telephone Encounter (Signed)
Prescription refill request for Xarelto received.  Indication: Atrial fib Last office visit: 03/06/21  T. Turner MD Weight: 107.1kg Age: 75 Scr: 1.15 on 03/26/21 CrCl: 71.46  Based on above findings Xarelto 20mg  daily is the appropriate dose.  Refill approved.

## 2021-05-01 LAB — HYPERSENSITIVITY PNEUMONITIS
A. Pullulans Abs: NEGATIVE
A.Fumigatus #1 Abs: NEGATIVE
Micropolyspora faeni, IgG: NEGATIVE
Pigeon Serum Abs: NEGATIVE
Thermoact. Saccharii: NEGATIVE
Thermoactinomyces vulgaris, IgG: NEGATIVE

## 2021-05-07 ENCOUNTER — Telehealth: Payer: Self-pay | Admitting: Internal Medicine

## 2021-05-07 ENCOUNTER — Ambulatory Visit (INDEPENDENT_AMBULATORY_CARE_PROVIDER_SITE_OTHER): Payer: Medicare Other | Admitting: Internal Medicine

## 2021-05-07 ENCOUNTER — Other Ambulatory Visit: Payer: Self-pay

## 2021-05-07 VITALS — BP 110/80 | HR 86 | Temp 97.7°F | Ht 66.0 in | Wt 238.2 lb

## 2021-05-07 DIAGNOSIS — Z0000 Encounter for general adult medical examination without abnormal findings: Secondary | ICD-10-CM | POA: Diagnosis not present

## 2021-05-07 DIAGNOSIS — I5042 Chronic combined systolic (congestive) and diastolic (congestive) heart failure: Secondary | ICD-10-CM | POA: Diagnosis not present

## 2021-05-07 DIAGNOSIS — E1151 Type 2 diabetes mellitus with diabetic peripheral angiopathy without gangrene: Secondary | ICD-10-CM

## 2021-05-07 DIAGNOSIS — E785 Hyperlipidemia, unspecified: Secondary | ICD-10-CM

## 2021-05-07 DIAGNOSIS — I10 Essential (primary) hypertension: Secondary | ICD-10-CM

## 2021-05-07 DIAGNOSIS — I70209 Unspecified atherosclerosis of native arteries of extremities, unspecified extremity: Secondary | ICD-10-CM

## 2021-05-07 DIAGNOSIS — J449 Chronic obstructive pulmonary disease, unspecified: Secondary | ICD-10-CM | POA: Diagnosis not present

## 2021-05-07 DIAGNOSIS — E119 Type 2 diabetes mellitus without complications: Secondary | ICD-10-CM | POA: Diagnosis not present

## 2021-05-07 DIAGNOSIS — M8580 Other specified disorders of bone density and structure, unspecified site: Secondary | ICD-10-CM

## 2021-05-07 DIAGNOSIS — Z7901 Long term (current) use of anticoagulants: Secondary | ICD-10-CM | POA: Diagnosis not present

## 2021-05-07 DIAGNOSIS — Z853 Personal history of malignant neoplasm of breast: Secondary | ICD-10-CM

## 2021-05-07 DIAGNOSIS — I4819 Other persistent atrial fibrillation: Secondary | ICD-10-CM | POA: Diagnosis not present

## 2021-05-07 LAB — POCT GLYCOSYLATED HEMOGLOBIN (HGB A1C): Hemoglobin A1C: 7.5 % — AB (ref 4.0–5.6)

## 2021-05-07 MED ORDER — METFORMIN HCL 500 MG PO TABS: 500.0000 mg | ORAL_TABLET | Freq: Every day | ORAL | 1 refills | Status: DC

## 2021-05-07 NOTE — Telephone Encounter (Signed)
LMTCB

## 2021-05-07 NOTE — Progress Notes (Signed)
Established Patient Office Visit     This visit occurred during the SARS-CoV-2 public health emergency.  Safety protocols were in place, including screening questions prior to the visit, additional usage of staff PPE, and extensive cleaning of exam room while observing appropriate contact time as indicated for disinfecting solutions.    CC/Reason for Visit: Annual preventive exam and subsequent Medicare wellness visit  HPI: Cynthia Herrera is a 75 y.o. female who is coming in today for the above mentioned reasons. Past Medical History is significant for: 2 diabetes with peripheral neuropathy, A. fib and chronic combined heart failure followed by cardiology, history of breast cancer followed by oncology on anastrozole, she also has a history of COPD and chronic dyspnea on exertion, hypertension, hyperlipidemia, obstructive sleep apnea and osteopenia.  She is currently undergoing work-up with pulmonology for what I can only assume with potential ILD.  She would like to decrease the dose of her metformin due to diarrhea.  She would like to take it once daily instead of twice daily.  Her most recent A1c was 6.7 in March.  She has routine eye and dental care.  She had a colonoscopy in 2018, had a mammogram in December 2021.   Past Medical/Surgical History: Past Medical History:  Diagnosis Date   Arthritis    Breast cancer, left breast ([place]) 2002   "then treated w/chemo and radiation"   Breast cancer, left breast ([place]) 07/14/2017   "tx'd w/mastectomy"   Chronic back pain    h/o lumbar stenosis; "no problem since my OR" (`10/27/2017)   Chronic systolic CHF (congestive heart failure) (HCC)    Complication of anesthesia    last surgery iv med when going to sleep"burned as injected"   COPD (chronic obstructive pulmonary disease) ([place])    no inhalers    DCM (dilated cardiomyopathy) ([place])    EF 15-20% ? tachycardia induced - EF 35-40% by echo 05/2018   Dyspnea    with exertion   Gallstones     GERD (gastroesophageal reflux disease)    once in a while;depends on what she eats   History of bronchitis    "chronic when I smoked; no problem since I quit in 2002" (10/27/2017)   Hyperlipidemia    Hypertension    takes Losartan and Metoprolol.    Joint pain    Muscle spasm    takes Flexeril daily as needed    Nocturia    OSA (obstructive sleep apnea) 08/22/2018   Severe obstructive sleep apnea with an AHI 47.3/h and no significant central sleep apnea with moderate oxygen desaturations as low as 79%   Osteopenia    Persistent atrial fibrillation (HCC)    s/p TEE DCCV and repeat DCCV 11/14/2013 and 02/2018   Personal history of colonic polyps    adenomas 03 and 08   Pneumonia    hx   Seasonal allergies    Thyroid nodule    Type II diabetes mellitus ([place])    Vitamin D deficiency    takes Vit D    Past Surgical History:  Procedure Laterality Date   ATRIAL FIBRILLATION ABLATION N/A 12/12/2018   Procedure: ATRIAL FIBRILLATION ABLATION;  Surgeon: Thompson Grayer, MD;  Location: [facility];  Service: Cardiovascular;  Laterality: N/A;   AXILLARY LYMPH NODE DISSECTION Left 10/2001   persistent intramammary node /notes 03/09/2011   BACK SURGERY     BREAST BIOPSY Left 2002   BREAST BIOPSY Right 06/2017   "node bx was  negative"   BREAST BIOPSY Left 06/2017   "positive for cancer"   BREAST LUMPECTOMY WITH RADIOACTIVE SEED LOCALIZATION Right 08/15/2017   Procedure: RIGHT BREAST LUMPECTOMY WITH RADIOACTIVE SEED LOCALIZATION;  Surgeon: Jovita Kussmaul, MD;  Location: [facility];  Service: General;  Laterality: Right;   CARDIAC CATHETERIZATION  2015   CARDIOVERSION N/A 11/12/2013   Procedure: CARDIOVERSION;  Surgeon: Thayer Headings, MD;  Location: [facility];  Service: Cardiovascular;  Laterality: N/A;  10:08  Dr. Marissa Nestle, anesthesia present, [place]   60mg ,  propofol 50mg , IV for elective cardioversion....Dr. Cathie Olden delievered synch 120 joules with successful cardioversion to NSR    CARDIOVERSION N/A 11/12/2013   Procedure: CARDIOVERSION;  Surgeon: Thayer Headings, MD;  Location: [facility];  Service: Cardiovascular;  Laterality: N/A;   CARDIOVERSION N/A 11/14/2013   Procedure: CARDIOVERSION (BEDSIDE);  Surgeon: Larey Dresser, MD;  Location: [facility];  Service: Cardiovascular;  Laterality: N/A;   CARDIOVERSION N/A 02/22/2018   Procedure: CARDIOVERSION;  Surgeon: Sueanne Margarita, MD;  Location: [facility];  Service: Cardiovascular;  Laterality: N/A;   CATARACT EXTRACTION W/ INTRAOCULAR LENS  IMPLANT, BILATERAL Bilateral ~ 2016   COLONOSCOPY  2003, September 2008, April 01, 2011   adenomas 03 and 08, polyp 12, diverticulosis   COLONOSCOPY WITH PROPOFOL N/A 11/16/2016   Procedure: COLONOSCOPY WITH PROPOFOL;  Surgeon: Gatha Mayer, MD;  Location: [facility];  Service: Endoscopy;  Laterality: N/A;   JOINT REPLACEMENT     LAPAROSCOPIC CHOLECYSTECTOMY  2004   LEFT AND RIGHT HEART CATHETERIZATION WITH CORONARY ANGIOGRAM N/A 02/01/2014   Procedure: LEFT AND RIGHT HEART CATHETERIZATION WITH CORONARY ANGIOGRAM;  Surgeon: Sueanne Margarita, MD;  Location: [facility];  Service: Cardiovascular;  Laterality: N/A;   LUMBAR LAMINECTOMY/DECOMPRESSION MICRODISCECTOMY N/A 03/19/2014   Procedure: LUMBAR LAMINECTOMY/DECOMPRESSION MICRODISCECTOMY 4 LEVEL;  Surgeon: Kristeen Miss, MD;  Location: [facility];  Service: Neurosurgery;  Laterality: N/A;  L1-2 L2-3 L3-4 L4-5 Laminectomy/Foraminotomy   MASS EXCISION Left 08/15/2017   Procedure: EXCISION OF LEFT BREAST MASS;  Surgeon: Jovita Kussmaul, MD;  Location: [facility];  Service: General;  Laterality: Left;   MASTECTOMY Left 10/27/2017   MASTECTOMY PARTIAL / LUMPECTOMY W/ AXILLARY LYMPHADENECTOMY  05/08/2001   Archie Endo 03/09/2011   PORT-A-CATH REMOVAL  2003   PORTA CATH INSERTION  2002   TEE WITHOUT CARDIOVERSION N/A 11/12/2013   Procedure: TRANSESOPHAGEAL ECHOCARDIOGRAM (TEE);  Surgeon: Thayer Headings, MD;  Location: [facility];  Service:  Cardiovascular;  Laterality: N/A;  pt b/p low, pt buccal membranes very dry, lips scapped, pt c/o thirst. NPO since MN and iv FLUIDS TOTAL INFUSING AT TOTAL 20ML HR....  Dr. Cathie Olden order allow NS to bolus during procedure....very dry,NS bolus 250 ml total..pt responding well to meds.Marland Kitchen   TOTAL KNEE ARTHROPLASTY Left 2006   TOTAL KNEE ARTHROPLASTY Right 05/10/2016   Procedure: RIGHT TOTAL KNEE ARTHROPLASTY;  Surgeon: Gaynelle Arabian, MD;  Location: [facility];  Service: Orthopedics;  Laterality: Right;  With adductor block   TOTAL MASTECTOMY Left 10/27/2017   Procedure: LEFT MASTECTOMY;  Surgeon: Jovita Kussmaul, MD;  Location: [facility];  Service: General;  Laterality: Left;   TUBAL LIGATION      Social History:  reports that she quit smoking about 19 years ago. Her smoking use included cigarettes. She has a 52.50 pack-year smoking history. She has never used smokeless tobacco. She reports that she does not drink alcohol and does not use drugs.  Allergies: Allergies  Allergen Reactions   Tikosyn [  Dofetilide]     Prolonged QT/QTc   Zolpidem Other (See Comments)    Hallucinations, up walking around ? Hallucinations, up walking around   Codeine Other (See Comments)   Codeine Phosphate Nausea And Vomiting   Methylprednisolone Other (See Comments)    Felt really weird w the high dose oral steroid, but tolerates low doses or oral steroid Felt really weird w the high dose oral steroid, but tolerates low doses or oral steroid    Family History:  Family History  Problem Relation Age of Onset   Dementia Sister    Diabetes Sister    Alzheimer's disease Sister    Diabetes Brother        x 2   Heart disease Brother    Irritable bowel syndrome Brother    Liver cancer Mother    Diabetes Mother    Pancreatic cancer Mother    Diabetes Father    Heart disease Father      Current Outpatient Medications:    atorvastatin (LIPITOR) 40 MG tablet, TAKE 1 TABLET BY MOUTH EVERYDAY AT BEDTIME, Disp: 90 tablet,  Rfl: 1   Cholecalciferol (VITAMIN D3) 2000 units capsule, Take 2,000 Units by mouth daily., Disp: , Rfl:    clotrimazole-betamethasone (LOTRISONE) cream, Apply 1 application topically 2 (two) times daily., Disp: 30 g, Rfl: 0   fexofenadine (ALLEGRA) 180 MG tablet, Take 180 mg by mouth daily as needed for allergies or rhinitis., Disp: , Rfl:    fluticasone (FLONASE) 50 MCG/ACT nasal spray, Place 1 spray into both nostrils daily as needed for allergies. , Disp: , Rfl:    furosemide (LASIX) 20 MG tablet, Take 1 tablet (20 mg total) by mouth daily., Disp: 90 tablet, Rfl: 3   gabapentin (NEURONTIN) 300 MG capsule, TAKE 2 CAPSULES BY MOUTH AT BEDTIME., Disp: 180 capsule, Rfl: 1   glucose blood test strip, Use to check CBG AC and QHS., Disp: 200 each, Rfl: 12   KLOR-CON M20 20 MEQ tablet, Take 1 tablet (20 mEq total) by mouth daily., Disp: 90 tablet, Rfl: 2   letrozole (FEMARA) 2.5 MG tablet, Take 1 tablet (2.5 mg total) by mouth daily., Disp: 90 tablet, Rfl: 3   losartan (COZAAR) 25 MG tablet, Take 1 tablet (25 mg total) by mouth 2 (two) times daily., Disp: 180 tablet, Rfl: 2   nebivolol (BYSTOLIC) 2.5 MG tablet, Take 1 tablet (2.5 mg total) by mouth daily., Disp: 90 tablet, Rfl: 2   OneTouch Delica Lancets 12X MISC, 1 each by Does not apply route daily., Disp: 100 each, Rfl: 1   pantoprazole (PROTONIX) 40 MG tablet, Take 1 tablet (40 mg total) by mouth daily., Disp: 90 tablet, Rfl: 3   rivaroxaban (XARELTO) 20 MG TABS tablet, TAKE 1 TABLET BY MOUTH EVERY DAY WITH SUPPER, Disp: 90 tablet, Rfl: 1   spironolactone (ALDACTONE) 25 MG tablet, Take 1 tablet (25 mg total) by mouth daily., Disp: 90 tablet, Rfl: 2   traMADol (ULTRAM) 50 MG tablet, Take 50 mg by mouth as needed for moderate pain., Disp: , Rfl:    metFORMIN (GLUCOPHAGE) 500 MG tablet, Take 1 tablet (500 mg total) by mouth daily., Disp: 90 tablet, Rfl: 1  Review of Systems:  Constitutional: Denies fever, chills, diaphoresis, appetite change and  fatigue.  HEENT: Denies photophobia, eye pain, redness, hearing loss, ear pain, congestion, sore throat, rhinorrhea, sneezing, mouth sores, trouble swallowing, neck pain, neck stiffness and tinnitus.   Respiratory: Denies SOB, DOE, cough, chest tightness,  and wheezing.  Cardiovascular: Denies chest pain, palpitations and leg swelling.  Gastrointestinal: Denies nausea, vomiting, abdominal pain, diarrhea, constipation, blood in stool and abdominal distention.  Genitourinary: Denies dysuria, urgency, frequency, hematuria, flank pain and difficulty urinating.  Endocrine: Denies: hot or cold intolerance, sweats, changes in hair or nails, polyuria, polydipsia. Musculoskeletal: Denies myalgias, back pain, joint swelling, arthralgias and gait problem.  Skin: Denies pallor, rash and wound.  Neurological: Denies dizziness, seizures, syncope, weakness, light-headedness, numbness and headaches.  Hematological: Denies adenopathy. Easy bruising, personal or family bleeding history  Psychiatric/Behavioral: Denies suicidal ideation, mood changes, confusion, nervousness, sleep disturbance and agitation    Physical Exam: Vitals:   05/07/21 1103  BP: 110/80  Pulse: 86  Temp: 97.7 F (36.5 C)  TempSrc: Oral  SpO2: 97%  Weight: 238 lb 3.2 oz (108 kg)  Height: 5\' 6"  (1.676 m)    Body mass index is 38.45 kg/m.   Constitutional: NAD, calm, comfortable Eyes: PERRL, lids and conjunctivae normal ENMT: Mucous membranes are moist. Posterior pharynx clear of any exudate or lesions. Normal dentition. Tympanic membrane is pearly white, no erythema or bulging. Neck: normal, supple, no masses, no thyromegaly Respiratory: clear to auscultation bilaterally, no wheezing, no crackles. Normal respiratory effort. No accessory muscle use.  Cardiovascular: Regular rate and rhythm, no murmurs / rubs / gallops. No extremity edema. 2+ pedal pulses. No carotid bruits.  Abdomen: no tenderness, no masses palpated. No  hepatosplenomegaly. Bowel sounds positive.  Musculoskeletal: no clubbing / cyanosis. No joint deformity upper and lower extremities. Good ROM, no contractures. Normal muscle tone.  Skin: no rashes, lesions, ulcers. No induration Neurologic: CN 2-12 grossly intact. Sensation intact, DTR normal. Strength 5/5 in all 4.  Psychiatric: Normal judgment and insight. Alert and oriented x 3. Normal mood.    Subsequent Medicare wellness visit   1. Risk factors, based on past  M,S,F -cardiovascular disease risk factors include age, gender, obesity, history of hypertension, hyperlipidemia and diabetes   2.  Physical activities: Very sedentary   3.  Depression/mood: Stable, not depressed   4.  Hearing: No perceived issues   5.  ADL's: Independent in all ADLs   6.  Fall risk: Low fall risk   7.  Home safety: No problems identified   8.  Height weight, and visual acuity: height and weight as above, vision:  Vision Screening   Right eye Left eye Both eyes  Without correction 20/25 20/40 20/25   With correction        9.  Counseling: Advised to update her immunizations, increase physical activity as tolerated   10. Lab orders based on risk factors: Laboratory update will be reviewed   11. Referral : None today   12. Care plan: Follow-up with me in 3 to 4 months   13. Cognitive assessment: No cognitive impairment   14. Screening: Patient provided with a written and personalized 5-10 year screening schedule in the AVS. yes   15. Provider List Update: PCP, cardiology, pulmonology, oncology  16. Advance Directives: Full code   17. Opioids: Patient is not on any opioid prescriptions and has no risk factors for a substance use disorder.   [place] Office Visit from 05/07/2021 in [place] at [place]  PHQ-9 Total Score 1       Fall Risk  05/07/2021 12/08/2020 02/12/2020 01/02/2019 05/22/2018  Falls in the past year? 0 0 0 0 No  Number falls in past yr: 0 - 0 0 -  Injury  with Fall? 0 - 0 0 -  Risk for fall due to : - Orthopedic patient - - -  Follow up - Falls prevention discussed - - -     Impression and Plan:  Osteopenia, unspecified location  - Plan: DG Bone Density  Encounter for preventive health examination -She has routine eye and dental care. -She is due for Tdap, shingles and second COVID booster which she will obtain at her pharmacy. -She had a mammogram for record -Healthy lifestyle discussed in detail. -Bone density test requested today. -She had a colonoscopy in 2018 and was told that she needed no further, I agree due to age and previously normal colonoscopy. -She will be due for mammogram in December 2022. -She elects to no longer do Pap smears due to her age, I agree.  Essential hypertension -Well-controlled on current regimen.  Dyslipidemia -Recent lipid record total cholesterol 191 and LDL 72.  Chronic combined systolic and diastolic CHF (congestive heart failure) ([place]) -Followed by cardiology.  Persistent atrial fibrillation (HCC) Chronic anticoagulation- Xarelto -Rate controlled, followed by cardiology.  Diabetes mellitus with coincident hypertension ([place])  - Plan: POCT glycosylated hemoglobin (Hb A1C) -Per patient request, I will decrease metformin from twice daily to once daily.  If A1c increases we will need to consider addition of a secondary medication.  Given her history of heart failure, an S GLT 2 medication might be beneficial.  History of left breast cancer -On anastrozole.  Chronic obstructive pulmonary disease, unspecified COPD type ([place]) -With chronic dyspnea on exertion that has worsened despite improvement of her congestive heart failure.  She is undergoing work-up for presumptive ILD with a high-resolution CT scan and lab work.   Patient Instructions  -Nice seeing you today!!  -You are duw for the following vaccines, tdap, shingles and 2nd COVID booster.  -decrease metformin to once a  day.  -Schedule follow up in 3 months.     Lelon Frohlich, MD [facility]

## 2021-05-07 NOTE — Patient Instructions (Signed)
-  Nice seeing you today!!  -You are duw for the following vaccines, tdap, shingles and 2nd COVID booster.  -decrease metformin to once a day.  -Schedule follow up in 3 months.

## 2021-05-14 NOTE — Telephone Encounter (Signed)
I called and spoke with patient regarding lab results. I informed the patient that we do have the results from 04/24/21 but doesn't look like Dr. Chase Caller has not looked at them yet. I inform patient that he is on vacation and he will be back on Tuesday, 05/19/21 and I will route this message to him and we will reach out once we hear back.  Dr. Chase Caller, please advise on lab results from 04/24/21. Thanks!

## 2021-05-14 NOTE — Telephone Encounter (Signed)
Pt returning a phone call about her labwork. Pt can be reached at [phone]

## 2021-05-21 ENCOUNTER — Ambulatory Visit: Payer: Medicare Other | Admitting: Internal Medicine

## 2021-05-27 DIAGNOSIS — Z170 Estrogen receptor positive status [ER+]: Secondary | ICD-10-CM | POA: Diagnosis not present

## 2021-05-27 DIAGNOSIS — C50912 Malignant neoplasm of unspecified site of left female breast: Secondary | ICD-10-CM | POA: Diagnosis not present

## 2021-06-06 ENCOUNTER — Other Ambulatory Visit: Payer: Self-pay | Admitting: Cardiology

## 2021-06-18 ENCOUNTER — Other Ambulatory Visit (HOSPITAL_BASED_OUTPATIENT_CLINIC_OR_DEPARTMENT_OTHER): Payer: Medicare Other

## 2021-06-19 ENCOUNTER — Encounter: Payer: Self-pay | Admitting: Internal Medicine

## 2021-06-19 ENCOUNTER — Other Ambulatory Visit: Payer: Self-pay

## 2021-06-19 ENCOUNTER — Ambulatory Visit (INDEPENDENT_AMBULATORY_CARE_PROVIDER_SITE_OTHER): Payer: Medicare Other | Admitting: Internal Medicine

## 2021-06-19 VITALS — BP 120/80 | HR 88 | Temp 97.8°F | Wt 235.1 lb

## 2021-06-19 DIAGNOSIS — M109 Gout, unspecified: Secondary | ICD-10-CM | POA: Diagnosis not present

## 2021-06-19 MED ORDER — COLCHICINE 0.6 MG PO TABS: 0.6000 mg | ORAL_TABLET | Freq: Two times a day (BID) | ORAL | 0 refills | Status: DC

## 2021-06-19 NOTE — Patient Instructions (Signed)
-  Nice seeing you today!!  -Start Colchicine 0.6 mg twice daily for at least 14 days.

## 2021-06-19 NOTE — Progress Notes (Signed)
Acute office Visit     This visit occurred during the SARS-CoV-2 public health emergency.  Safety protocols were in place, including screening questions prior to the visit, additional usage of staff PPE, and extensive cleaning of exam room while observing appropriate contact time as indicated for disinfecting solutions.    CC/Reason for Visit: Left ankle and foot swelling  HPI: Cynthia Herrera is a 75 y.o. female who is coming in today for the above mentioned reasons.  For about 2 weeks she has been having swelling and redness of her left foot.  This started at around her ankle and has spread down into her arch area.  It is extremely painful.  She is not known to have had gout in the past.  Past Medical/Surgical History: Past Medical History:  Diagnosis Date   Arthritis    Breast cancer, left breast ([place]) 2002   "then treated w/chemo and radiation"   Breast cancer, left breast ([place]) 07/14/2017   "tx'd w/mastectomy"   Chronic back pain    h/o lumbar stenosis; "no problem since my OR" (`10/27/2017)   Chronic systolic CHF (congestive heart failure) (HCC)    Complication of anesthesia    last surgery iv med when going to sleep"burned as injected"   COPD (chronic obstructive pulmonary disease) ([place])    no inhalers    DCM (dilated cardiomyopathy) ([place])    EF 15-20% ? tachycardia induced - EF 35-40% by echo 05/2018   Dyspnea    with exertion   Gallstones    GERD (gastroesophageal reflux disease)    once in a while;depends on what she eats   History of bronchitis    "chronic when I smoked; no problem since I quit in 2002" (10/27/2017)   Hyperlipidemia    Hypertension    takes Losartan and Metoprolol.    Joint pain    Muscle spasm    takes Flexeril daily as needed    Nocturia    OSA (obstructive sleep apnea) 08/22/2018   Severe obstructive sleep apnea with an AHI 47.3/h and no significant central sleep apnea with moderate oxygen desaturations as low as 79%   Osteopenia     Persistent atrial fibrillation (HCC)    s/p TEE DCCV and repeat DCCV 11/14/2013 and 02/2018   Personal history of colonic polyps    adenomas 03 and 08   Pneumonia    hx   Seasonal allergies    Thyroid nodule    Type II diabetes mellitus ([place])    Vitamin D deficiency    takes Vit D    Past Surgical History:  Procedure Laterality Date   ATRIAL FIBRILLATION ABLATION N/A 12/12/2018   Procedure: ATRIAL FIBRILLATION ABLATION;  Surgeon: Thompson Grayer, MD;  Location: [place] [facility];  Service: Cardiovascular;  Laterality: N/A;   AXILLARY LYMPH NODE DISSECTION Left 10/2001   persistent intramammary node /notes 03/09/2011   BACK SURGERY     BREAST BIOPSY Left 2002   BREAST BIOPSY Right 06/2017   "node bx was negative"   BREAST BIOPSY Left 06/2017   "positive for cancer"   BREAST LUMPECTOMY WITH RADIOACTIVE SEED LOCALIZATION Right 08/15/2017   Procedure: RIGHT BREAST LUMPECTOMY WITH RADIOACTIVE SEED LOCALIZATION;  Surgeon: Jovita Kussmaul, MD;  Location: [place];  Service: General;  Laterality: Right;   CARDIAC CATHETERIZATION  2015   CARDIOVERSION N/A 11/12/2013   Procedure: CARDIOVERSION;  Surgeon: Thayer Headings, MD;  Location: [facility];  Service: Cardiovascular;  Laterality: N/A;  10:08  Dr. Marissa Nestle, anesthesia present, L[place]   '60mg'$ ,  propofol '50mg'$ , IV for elective cardioversion....Dr. Cathie Olden delievered synch 120 joules with successful cardioversion to NSR   CARDIOVERSION N/A 11/12/2013   Procedure: CARDIOVERSION;  Surgeon: Thayer Headings, MD;  Location: [place];  Service: Cardiovascular;  Laterality: N/A;   CARDIOVERSION N/A 11/14/2013   Procedure: CARDIOVERSION (BEDSIDE);  Surgeon: Larey Dresser, MD;  Location: [place];  Service: Cardiovascular;  Laterality: N/A;   CARDIOVERSION N/A 02/22/2018   Procedure: CARDIOVERSION;  Surgeon: Sueanne Margarita, MD;  Location: [facility];  Service: Cardiovascular;  Laterality: N/A;   CATARACT EXTRACTION W/ INTRAOCULAR LENS  IMPLANT, BILATERAL  Bilateral ~ 2016   COLONOSCOPY  2003, September 2008, April 01, 2011   adenomas 03 and 08, polyp 12, diverticulosis   COLONOSCOPY WITH PROPOFOL N/A 11/16/2016   Procedure: COLONOSCOPY WITH PROPOFOL;  Surgeon: Gatha Mayer, MD;  Location: [facility] END[facility];  Service: Endoscopy;  Laterality: N/A;   JOINT REPLACEMENT     LAPAROSCOPIC CHOLECYSTECTOMY  2004   LEFT AND RIGHT HEART CATHETERIZATION WITH CORONARY ANGIOGRAM N/A 02/01/2014   Procedure: LEFT AND RIGHT HEART CATHETERIZATION WITH CORONARY ANGIOGRAM;  Surgeon: Sueanne Margarita, MD;  Location: [place] CATH LAB;  Service: Cardiovascular;  Laterality: N/A;   LUMBAR LAMINECTOMY/DECOMPRESSION MICRODISCECTOMY N/A 03/19/2014   Procedure: LUMBAR LAMINECTOMY/DECOMPRESSION MICRODISCECTOMY 4 LEVEL;  Surgeon: Kristeen Miss, MD;  Location: [place] [facility]URO ORS;  Service: Neurosurgery;  Laterality: N/A;  L1-2 L2-3 L3-4 L4-5 Laminectomy/Foraminotomy   MASS EXCISION Left 08/15/2017   Procedure: EXCISION OF LEFT BREAST MASS;  Surgeon: Jovita Kussmaul, MD;  Location: [place];  Service: General;  Laterality: Left;   MASTECTOMY Left 10/27/2017   MASTECTOMY PARTIAL / LUMPECTOMY W/ AXILLARY LYMPHADENECTOMY  05/08/2001   Archie Endo 03/09/2011   PORT-A-CATH REMOVAL  2003   PORTA CATH INSERTION  2002   TEE WITHOUT CARDIOVERSION N/A 11/12/2013   Procedure: TRANSESOPHAGEAL ECHOCARDIOGRAM (TEE);  Surgeon: Thayer Headings, MD;  Location: [facility] [place];  Service: Cardiovascular;  Laterality: N/A;  pt b/p low, pt buccal membranes very dry, lips scapped, pt c/o thirst. NPO since MN and iv FLUIDS TOTAL INFUSING AT TOTAL 20ML HR....  Dr. Cathie Olden order allow NS to bolus during procedure....very dry,NS bolus 250 ml total..pt responding well to meds.Marland Kitchen   TOTAL KNEE ARTHROPLASTY Left 2006   TOTAL KNEE ARTHROPLASTY Right 05/10/2016   Procedure: RIGHT TOTAL KNEE ARTHROPLASTY;  Surgeon: Gaynelle Arabian, MD;  Location: [facility];  Service: Orthopedics;  Laterality: Right;  With adductor block   TOTAL MASTECTOMY Left  10/27/2017   Procedure: LEFT MASTECTOMY;  Surgeon: Jovita Kussmaul, MD;  Location: [place];  Service: General;  Laterality: Left;   TUBAL LIGATION      Social History:  reports that she quit smoking about 19 years ago. Her smoking use included cigarettes. She has a 52.50 pack-year smoking history. She has never used smokeless tobacco. She reports that she does not drink alcohol and does not use drugs.  Allergies: Allergies  Allergen Reactions   Tikosyn [Dofetilide]     Prolonged QT/QTc   Zolpidem Other (See Comments)    Hallucinations, up walking around ? Hallucinations, up walking around   Codeine Other (See Comments)   Codeine Phosphate Nausea And Vomiting   Methylprednisolone Other (See Comments)    Felt really weird w the high dose oral steroid, but tolerates low doses or oral steroid Felt really weird w the high dose oral steroid, but tolerates low doses or oral steroid  Family History:  Family History  Problem Relation Age of Onset   Dementia Sister    Diabetes Sister    Alzheimer's disease Sister    Diabetes Brother        x 2   Heart disease Brother    Irritable bowel syndrome Brother    Liver cancer Mother    Diabetes Mother    Pancreatic cancer Mother    Diabetes Father    Heart disease Father      Current Outpatient Medications:    atorvastatin (LIPITOR) 40 MG tablet, TAKE 1 TABLET BY MOUTH EVERYDAY AT BEDTIME, Disp: 90 tablet, Rfl: 1   Cholecalciferol (VITAMIN D3) 2000 units capsule, Take 2,000 Units by mouth daily., Disp: , Rfl:    clotrimazole-betamethasone (LOTRISONE) cream, Apply 1 application topically 2 (two) times daily., Disp: 30 g, Rfl: 0   colchicine 0.6 MG tablet, Take 1 tablet (0.6 mg total) by mouth 2 (two) times daily for 21 days., Disp: 42 tablet, Rfl: 0   fexofenadine (ALLEGRA) 180 MG tablet, Take 180 mg by mouth daily as needed for allergies or rhinitis., Disp: , Rfl:    fluticasone (FLONASE) 50 MCG/ACT nasal spray, Place 1 spray into both  nostrils daily as needed for allergies. , Disp: , Rfl:    furosemide (LASIX) 20 MG tablet, TAKE 1 TABLET BY MOUTH TWICE A DAY, Disp: 180 tablet, Rfl: 1   gabapentin (NEURONTIN) 300 MG capsule, TAKE 2 CAPSULES BY MOUTH AT BEDTIME., Disp: 180 capsule, Rfl: 1   glucose blood test strip, Use to check CBG AC and QHS., Disp: 200 each, Rfl: 12   KLOR-CON M20 20 MEQ tablet, Take 1 tablet (20 mEq total) by mouth daily., Disp: 90 tablet, Rfl: 2   letrozole (FEMARA) 2.5 MG tablet, Take 1 tablet (2.5 mg total) by mouth daily., Disp: 90 tablet, Rfl: 3   losartan (COZAAR) 25 MG tablet, Take 1 tablet (25 mg total) by mouth 2 (two) times daily., Disp: 180 tablet, Rfl: 2   metFORMIN (GLUCOPHAGE) 500 MG tablet, Take 1 tablet (500 mg total) by mouth daily., Disp: 90 tablet, Rfl: 1   nebivolol (BYSTOLIC) 2.5 MG tablet, Take 1 tablet (2.5 mg total) by mouth daily., Disp: 90 tablet, Rfl: 2   OneTouch Delica Lancets 99991111 MISC, 1 each by Does not apply route daily., Disp: 100 each, Rfl: 1   pantoprazole (PROTONIX) 40 MG tablet, Take 1 tablet (40 mg total) by mouth daily., Disp: 90 tablet, Rfl: 3   rivaroxaban (XARELTO) 20 MG TABS tablet, TAKE 1 TABLET BY MOUTH EVERY DAY WITH SUPPER, Disp: 90 tablet, Rfl: 1   spironolactone (ALDACTONE) 25 MG tablet, Take 1 tablet (25 mg total) by mouth daily., Disp: 90 tablet, Rfl: 2   traMADol (ULTRAM) 50 MG tablet, Take 50 mg by mouth as needed for moderate pain., Disp: , Rfl:   Review of Systems:  Constitutional: Denies fever, diaphoresis, appetite change and fatigue.  HEENT: Denies photophobia, eye pain, redness, hearing loss, ear pain, congestion, sore throat, rhinorrhea, sneezing, mouth sores, trouble swallowing, neck pain, neck stiffness and tinnitus.   Respiratory: Denies SOB, DOE, cough, chest tightness,  and wheezing.   Cardiovascular: Denies chest pain, palpitations and leg swelling.  Gastrointestinal: Denies nausea, vomiting, abdominal pain, diarrhea, constipation, blood in  stool and abdominal distention.  Genitourinary: Denies dysuria, urgency, frequency, hematuria, flank pain and difficulty urinating.  Endocrine: Denies: hot or cold intolerance, sweats, changes in hair or nails, polyuria, polydipsia. Musculoskeletal: Denies myalgias, back pain. Skin: Denies  pallor, rash and wound.  Neurological: Denies dizziness, seizures, syncope, weakness, light-headedness, numbness and headaches.  Hematological: Denies adenopathy. Easy bruising, personal or family bleeding history  Psychiatric/Behavioral: Denies suicidal ideation, mood changes, confusion, nervousness, sleep disturbance and agitation    Physical Exam: Vitals:   06/19/21 1332  BP: 120/80  Pulse: 88  Temp: 97.8 F (36.6 C)  TempSrc: Oral  SpO2: 94%  Weight: 235 lb 1.6 oz (106.6 kg)    Body mass index is 37.95 kg/m.   Constitutional: NAD, calm, comfortable Eyes: PERRL, lids and conjunctivae normal ENMT: Mucous membranes are moist.  Musculoskeletal: Left ankle is erythematous and edematous   Impression and Plan:  Acute gout of left ankle, unspecified cause - Plan: colchicine 0.6 MG tablet -She will be prescribed colchicine, she knows to follow-up with me if no improvement.    Patient Instructions  -Nice seeing you today!!  -Start Colchicine 0.6 mg twice daily for at least 14 days.      Lelon Frohlich, MD [facility]

## 2021-07-21 NOTE — Telephone Encounter (Signed)
MR, I do not see where pt was given the results for her lab work. Please advise. Thanks.

## 2021-07-30 ENCOUNTER — Other Ambulatory Visit: Payer: Self-pay | Admitting: Internal Medicine

## 2021-07-30 ENCOUNTER — Other Ambulatory Visit: Payer: Self-pay | Admitting: Podiatry

## 2021-07-30 DIAGNOSIS — E1140 Type 2 diabetes mellitus with diabetic neuropathy, unspecified: Secondary | ICD-10-CM

## 2021-07-30 NOTE — Telephone Encounter (Signed)
MR please advise on lab work from 04/24/21.   Thanks

## 2021-08-03 ENCOUNTER — Telehealth: Payer: Self-pay | Admitting: *Deleted

## 2021-08-03 NOTE — Telephone Encounter (Signed)
RN attempt x1 to contact pt regarding 08/27/21 appt and MD being out of office.  LVM with detailed information regarding rescheduled appt date and time.

## 2021-08-05 NOTE — Telephone Encounter (Signed)
Prescription is ready for pick up at pharmacy, called patient, left vmessage.

## 2021-08-06 ENCOUNTER — Telehealth: Payer: Self-pay | Admitting: Pharmacist

## 2021-08-06 NOTE — Chronic Care Management (AMB) (Signed)
Chronic Care Management Pharmacy Assistant   Name: Cynthia Herrera  MRN: [id number] DOB: 12-03-1945  Reason for Encounter: Disease State/ Hypertension Assessment Call.   Conditions to be addressed/monitored: HTN   Recent office visits:  08/11/21 Lelon Frohlich MD (PCP) - seen for type 2 diabetes and other issues. Patient started on jardiance 10mg  daily. Follow up in 3 months.   06/19/21 Lelon Frohlich MD (PCP) - seen for acute gout of left ankle. Patient started on colchicine 0.6mg  2 times daily. No follow up noted.   05/07/21 Estela Isaac Bliss MD (PCP) - seen for osteopenia and other issues. Metformin 500mg  decreased from twice daily to once daily. Follow up 3-4 months.   Recent consult visits:  05/27/21 Landry Corporal MD [facility]) - seen for malignant neoplasm of left breast in female, estrogen receptor positive, unspecified site of breast. No medication changes. Mammogram in December.   03/30/21 Brand Males MD (Pulmonary Disease) - seen for shortness of breath and other issues. Discontinued methylprednisolone 4mg . Follow up in 9 months.   Hospital visits:  None in previous 6 months  Medications: Outpatient Encounter Medications as of 08/06/2021  Medication Sig   atorvastatin (LIPITOR) 40 MG tablet TAKE 1 TABLET BY MOUTH EVERYDAY AT BEDTIME   Cholecalciferol (VITAMIN D3) 2000 units capsule Take 2,000 Units by mouth daily.   clotrimazole-betamethasone (LOTRISONE) cream Apply 1 application topically 2 (two) times daily.   colchicine 0.6 MG tablet Take 1 tablet (0.6 mg total) by mouth 2 (two) times daily for 21 days.   fexofenadine (ALLEGRA) 180 MG tablet Take 180 mg by mouth daily as needed for allergies or rhinitis.   fluconazole (DIFLUCAN) 150 MG tablet TAKE 1 TABLET BY MOUTH ONE TIME PER WEEK   fluticasone (FLONASE) 50 MCG/ACT nasal spray Place 1 spray into both nostrils daily as needed for allergies.    furosemide (LASIX) 20 MG  tablet TAKE 1 TABLET BY MOUTH TWICE A DAY   gabapentin (NEURONTIN) 300 MG capsule TAKE 2 CAPSULES BY MOUTH AT BEDTIME   glucose blood test strip Use to check CBG AC and QHS.   KLOR-CON M20 20 MEQ tablet Take 1 tablet (20 mEq total) by mouth daily.   letrozole (FEMARA) 2.5 MG tablet Take 1 tablet (2.5 mg total) by mouth daily.   losartan (COZAAR) 25 MG tablet Take 1 tablet (25 mg total) by mouth 2 (two) times daily.   metFORMIN (GLUCOPHAGE) 500 MG tablet Take 1 tablet (500 mg total) by mouth daily.   nebivolol (BYSTOLIC) 2.5 MG tablet Take 1 tablet (2.5 mg total) by mouth daily.   OneTouch Delica Lancets 46O MISC 1 each by Does not apply route daily.   pantoprazole (PROTONIX) 40 MG tablet Take 1 tablet (40 mg total) by mouth daily.   rivaroxaban (XARELTO) 20 MG TABS tablet TAKE 1 TABLET BY MOUTH EVERY DAY WITH SUPPER   spironolactone (ALDACTONE) 25 MG tablet Take 1 tablet (25 mg total) by mouth daily.   traMADol (ULTRAM) 50 MG tablet Take 50 mg by mouth as needed for moderate pain.   No facility-administered encounter medications on file as of 08/06/2021.   Fill History: ATORVASTATIN 40 MG TABLET 05/16/2021 90   COLCHICINE 0.6 MG TABLET 06/19/2021 21   FLUCONAZOLE 150 MG TABLET 03/05/2021 41   FUROSEMIDE 20 MG TABLET 06/08/2021 90   GABAPENTIN 300 MG CAPSULE 06/07/2021 90   ONETOUCH DELICA 03O LANCETS 10/17/8249 90   LETROZOLE 2.5 MG TABLET 07/02/2021 90  LOSARTAN POTASSIUM 25 MG TAB 02/03/2021 90   NEBIVOLOL 2.5 MG TABLET 06/07/2021 90   PANTOPRAZOLE SOD DR 40 MG TAB 01/06/2021 90   KLOR-CON M20 TABLET 07/02/2021 90    XARELTO 20 MG TABLET 07/11/2021 90   SPIRONOLACTONE 25 MG TABLET 05/07/2021 90   METFORMIN HCL 500 MG TABLET 06/02/2021 90   Reviewed chart prior to disease state call. Spoke with patient regarding BP  Recent Office Vitals: BP Readings from Last 3 Encounters:  06/19/21 120/80  05/07/21 110/80  03/30/21 128/74   Pulse Readings from Last 3  Encounters:  06/19/21 88  05/07/21 86  03/30/21 71    Wt Readings from Last 3 Encounters:  06/19/21 235 lb 1.6 oz (106.6 kg)  05/07/21 238 lb 3.2 oz (108 kg)  03/30/21 237 lb 6.4 oz (107.7 kg)     Kidney Function Lab Results  Component Value Date/Time   CREATININE 1.15 (H) 03/26/2021 10:36 AM   CREATININE 1.25 (H) 07/10/2020 04:53 PM   CREATININE 1.06 (H) 08/19/2015 12:44 PM   CREATININE 1.46 (H) 04/05/2014 03:33 PM   GFR 62.33 02/03/2017 12:07 PM   GFRNONAA 42 (L) 07/10/2020 04:53 PM   GFRAA 49 (L) 07/10/2020 04:53 PM    BMP Latest Ref Rng & Units 03/26/2021 07/10/2020 01/01/2020  Glucose 65 - 99 mg/dL 171(H) 109(H) 166(H)  BUN 8 - 27 mg/dL 19 25 21   Creatinine 0.57 - 1.00 mg/dL 1.15(H) 1.25(H) 1.14(H)  BUN/Creat Ratio 12 - 28 17 20 18   Sodium 134 - 144 mmol/L 137 142 140  Potassium 3.5 - 5.2 mmol/L 4.2 4.2 4.4  Chloride 96 - 106 mmol/L 101 105 103  CO2 20 - 29 mmol/L 21 21 22   Calcium 8.7 - 10.3 mg/dL 9.8 9.6 9.8    Current antihypertensive regimen:  Losartan 25mg  - take 1 tab let by mouth two times daily. Nebivolol 2.5mg  - take 1 tablet by mouth daily. How often are you checking your Blood Pressure?  Patient has not gotten a blood pressure cuff yet. Patient stated she does not feel she needs to check it at home because her blood pressure is always in normal range at her doctors appointments.  Current home BP readings: None to report. What recent interventions/DTPs have been made by any provider to improve Blood Pressure control since last CPP Visit: None Any recent hospitalizations or ED visits since last visit with CPP? No  Adherence Review: Is the patient currently on ACE/ARB medication? Yes Does the patient have >5 day gap between last estimated fill dates? No  Notes: Spoke with patient and reviewed medications as prescribed. Patient stated she was started on Jardiance yesterday at PCP office visit. However, she will not be starting it until November 1st since its so  late in the month. Patient reports no issues or other changes in other medications. Patient was reminded to get a blood pressure cuff and start checking blood pressure weekly and keeping a log. Patient is able to get plenty of activity. Patient thanked me for my call.   Care Gaps:  AWV- message sent to Ramond Craver CMA to schedule. Hepatitis C screening - never done Foot exam - overdue since 2018 Flu vaccine - due Last PCP BP: 110/80 P: 65  Star Rating Drugs:  Atorvastatin 40mg  - last filled on 05/16/21 [facility]  Losartan 25mg  - last filled on 02/03/21 [facility] at [facility] Metformin 500mg  - last filled on 8//22 90DS at [place] Pharmacist Assistant (  [phone]  

## 2021-08-07 ENCOUNTER — Ambulatory Visit: Payer: Medicare Other | Admitting: Internal Medicine

## 2021-08-10 ENCOUNTER — Other Ambulatory Visit: Payer: Self-pay

## 2021-08-11 ENCOUNTER — Ambulatory Visit (INDEPENDENT_AMBULATORY_CARE_PROVIDER_SITE_OTHER): Payer: Medicare Other | Admitting: Internal Medicine

## 2021-08-11 VITALS — BP 110/80 | HR 65 | Temp 98.2°F | Wt 235.4 lb

## 2021-08-11 DIAGNOSIS — Z23 Encounter for immunization: Secondary | ICD-10-CM | POA: Diagnosis not present

## 2021-08-11 DIAGNOSIS — I70209 Unspecified atherosclerosis of native arteries of extremities, unspecified extremity: Secondary | ICD-10-CM

## 2021-08-11 DIAGNOSIS — I5042 Chronic combined systolic (congestive) and diastolic (congestive) heart failure: Secondary | ICD-10-CM | POA: Diagnosis not present

## 2021-08-11 DIAGNOSIS — E1151 Type 2 diabetes mellitus with diabetic peripheral angiopathy without gangrene: Secondary | ICD-10-CM

## 2021-08-11 DIAGNOSIS — I4819 Other persistent atrial fibrillation: Secondary | ICD-10-CM | POA: Diagnosis not present

## 2021-08-11 DIAGNOSIS — Z853 Personal history of malignant neoplasm of breast: Secondary | ICD-10-CM

## 2021-08-11 DIAGNOSIS — Z7901 Long term (current) use of anticoagulants: Secondary | ICD-10-CM | POA: Diagnosis not present

## 2021-08-11 LAB — POCT GLYCOSYLATED HEMOGLOBIN (HGB A1C): Hemoglobin A1C: 7.4 % — AB (ref 4.0–5.6)

## 2021-08-11 MED ORDER — EMPAGLIFLOZIN 10 MG PO TABS: 10.0000 mg | ORAL_TABLET | Freq: Every day | ORAL | 1 refills | Status: DC

## 2021-08-11 NOTE — Progress Notes (Signed)
Established Patient Office Visit     This visit occurred during the SARS-CoV-2 public health emergency.  Safety protocols were in place, including screening questions prior to the visit, additional usage of staff PPE, and extensive cleaning of exam room while observing appropriate contact time as indicated for disinfecting solutions.    CC/Reason for Visit: 22-month follow-up chronic medical conditions  HPI: Cynthia Herrera is a 75 y.o. female who is coming in today for the above mentioned reasons. Past Medical History is significant for: Type 2 diabetes with peripheral neuropathy, A. fib and chronic combined heart failure followed by cardiology, history of breast cancer followed by oncology on anastrozole, she also has a history of COPD and chronic dyspnea on exertion, hypertension, hyperlipidemia, obstructive sleep apnea and osteopenia.  She was seen in August and treated for gout with success.  Her A1c was noted to be slightly above goal at 7.5 at last visit.  She had to lower the dose of her metformin significantly due to diarrhea.  She is feeling well and has no concerns.   Past Medical/Surgical History: Past Medical History:  Diagnosis Date   Arthritis    Breast cancer, left breast ([place]) 2002   "then treated w/chemo and radiation"   Breast cancer, left breast ([place]) 07/14/2017   "tx'd w/mastectomy"   Chronic back pain    h/o lumbar stenosis; "no problem since my OR" (`10/27/2017)   Chronic systolic CHF (congestive heart failure) (HCC)    Complication of anesthesia    last surgery iv med when going to sleep"burned as injected"   COPD (chronic obstructive pulmonary disease) ([place])    no inhalers    DCM (dilated cardiomyopathy) ([place])    EF 15-20% ? tachycardia induced - EF 35-40% by echo 05/2018   Dyspnea    with exertion   Gallstones    GERD (gastroesophageal reflux disease)    once in a while;depends on what she eats   History of bronchitis    "chronic when I smoked; no  problem since I quit in 2002" (10/27/2017)   Hyperlipidemia    Hypertension    takes Losartan and Metoprolol.    Joint pain    Muscle spasm    takes Flexeril daily as needed    Nocturia    OSA (obstructive sleep apnea) 08/22/2018   Severe obstructive sleep apnea with an AHI 47.3/h and no significant central sleep apnea with moderate oxygen desaturations as low as 79%   Osteopenia    Persistent atrial fibrillation (HCC)    s/p TEE DCCV and repeat DCCV 11/14/2013 and 02/2018   Personal history of colonic polyps    adenomas 03 and 08   Pneumonia    hx   Seasonal allergies    Thyroid nodule    Type II diabetes mellitus ([place])    Vitamin D deficiency    takes Vit D    Past Surgical History:  Procedure Laterality Date   ATRIAL FIBRILLATION ABLATION N/A 12/12/2018   Procedure: ATRIAL FIBRILLATION ABLATION;  Surgeon: Thompson Grayer, MD;  Location: [facility];  Service: Cardiovascular;  Laterality: N/A;   AXILLARY LYMPH NODE DISSECTION Left 10/2001   persistent intramammary node /notes 03/09/2011   BACK SURGERY     BREAST BIOPSY Left 2002   BREAST BIOPSY Right 06/2017   "node bx was negative"   BREAST BIOPSY Left 06/2017   "positive for cancer"   BREAST LUMPECTOMY WITH RADIOACTIVE SEED LOCALIZATION Right 08/15/2017   Procedure: RIGHT  BREAST LUMPECTOMY WITH RADIOACTIVE SEED LOCALIZATION;  Surgeon: Jovita Kussmaul, MD;  Location: [facility];  Service: General;  Laterality: Right;   CARDIAC CATHETERIZATION  2015   CARDIOVERSION N/A 11/12/2013   Procedure: CARDIOVERSION;  Surgeon: Thayer Headings, MD;  Location: [facility];  Service: Cardiovascular;  Laterality: N/A;  10:08  Dr. Marissa Nestle, anesthesia present, [place]   60mg ,  propofol 50mg , IV for elective cardioversion....Dr. Cathie Olden delievered synch 120 joules with successful cardioversion to NSR   CARDIOVERSION N/A 11/12/2013   Procedure: CARDIOVERSION;  Surgeon: Thayer Headings, MD;  Location: [facility];  Service: Cardiovascular;   Laterality: N/A;   CARDIOVERSION N/A 11/14/2013   Procedure: CARDIOVERSION (BEDSIDE);  Surgeon: Larey Dresser, MD;  Location: [facility];  Service: Cardiovascular;  Laterality: N/A;   CARDIOVERSION N/A 02/22/2018   Procedure: CARDIOVERSION;  Surgeon: Sueanne Margarita, MD;  Location: [facility];  Service: Cardiovascular;  Laterality: N/A;   CATARACT EXTRACTION W/ INTRAOCULAR LENS  IMPLANT, BILATERAL Bilateral ~ 2016   COLONOSCOPY  2003, September 2008, April 01, 2011   adenomas 03 and 08, polyp 12, diverticulosis   COLONOSCOPY WITH PROPOFOL N/A 11/16/2016   Procedure: COLONOSCOPY WITH PROPOFOL;  Surgeon: Gatha Mayer, MD;  Location: [facility];  Service: Endoscopy;  Laterality: N/A;   JOINT REPLACEMENT     LAPAROSCOPIC CHOLECYSTECTOMY  2004   LEFT AND RIGHT HEART CATHETERIZATION WITH CORONARY ANGIOGRAM N/A 02/01/2014   Procedure: LEFT AND RIGHT HEART CATHETERIZATION WITH CORONARY ANGIOGRAM;  Surgeon: Sueanne Margarita, MD;  Location: [facility];  Service: Cardiovascular;  Laterality: N/A;   LUMBAR LAMINECTOMY/DECOMPRESSION MICRODISCECTOMY N/A 03/19/2014   Procedure: LUMBAR LAMINECTOMY/DECOMPRESSION MICRODISCECTOMY 4 LEVEL;  Surgeon: Kristeen Miss, MD;  Location: [place] [facility];  Service: Neurosurgery;  Laterality: N/A;  L1-2 L2-3 L3-4 L4-5 Laminectomy/Foraminotomy   MASS EXCISION Left 08/15/2017   Procedure: EXCISION OF LEFT BREAST MASS;  Surgeon: Jovita Kussmaul, MD;  Location: [facility];  Service: General;  Laterality: Left;   MASTECTOMY Left 10/27/2017   MASTECTOMY PARTIAL / LUMPECTOMY W/ AXILLARY LYMPHADENECTOMY  05/08/2001   Archie Endo 03/09/2011   PORT-A-CATH REMOVAL  2003   PORTA CATH INSERTION  2002   TEE WITHOUT CARDIOVERSION N/A 11/12/2013   Procedure: TRANSESOPHAGEAL ECHOCARDIOGRAM (TEE);  Surgeon: Thayer Headings, MD;  Location: [facility];  Service: Cardiovascular;  Laterality: N/A;  pt b/p low, pt buccal membranes very dry, lips scapped, pt c/o thirst. NPO since MN and iv FLUIDS TOTAL INFUSING AT  TOTAL 20ML HR....  Dr. Cathie Olden order allow NS to bolus during procedure....very dry,NS bolus 250 ml total..pt responding well to meds.Marland Kitchen   TOTAL KNEE ARTHROPLASTY Left 2006   TOTAL KNEE ARTHROPLASTY Right 05/10/2016   Procedure: RIGHT TOTAL KNEE ARTHROPLASTY;  Surgeon: Gaynelle Arabian, MD;  Location: [facility];  Service: Orthopedics;  Laterality: Right;  With adductor block   TOTAL MASTECTOMY Left 10/27/2017   Procedure: LEFT MASTECTOMY;  Surgeon: Jovita Kussmaul, MD;  Location: [facility];  Service: General;  Laterality: Left;   TUBAL LIGATION      Social History:  reports that she quit smoking about 19 years ago. Her smoking use included cigarettes. She has a 52.50 pack-year smoking history. She has never used smokeless tobacco. She reports that she does not drink alcohol and does not use drugs.  Allergies: Allergies  Allergen Reactions   Tikosyn [Dofetilide]     Prolonged QT/QTc   Zolpidem Other (See Comments)    Hallucinations, up walking around ? Hallucinations, up walking around  Codeine Other (See Comments)   Codeine Phosphate Nausea And Vomiting   Methylprednisolone Other (See Comments)    Felt really weird w the high dose oral steroid, but tolerates low doses or oral steroid Felt really weird w the high dose oral steroid, but tolerates low doses or oral steroid    Family History:  Family History  Problem Relation Age of Onset   Dementia Sister    Diabetes Sister    Alzheimer's disease Sister    Diabetes Brother        x 2   Heart disease Brother    Irritable bowel syndrome Brother    Liver cancer Mother    Diabetes Mother    Pancreatic cancer Mother    Diabetes Father    Heart disease Father      Current Outpatient Medications:    atorvastatin (LIPITOR) 40 MG tablet, TAKE 1 TABLET BY MOUTH EVERYDAY AT BEDTIME, Disp: 90 tablet, Rfl: 1   Cholecalciferol (VITAMIN D3) 2000 units capsule, Take 2,000 Units by mouth daily., Disp: , Rfl:    clotrimazole-betamethasone (LOTRISONE)  cream, Apply 1 application topically 2 (two) times daily., Disp: 30 g, Rfl: 0   empagliflozin (JARDIANCE) 10 MG TABS tablet, Take 1 tablet (10 mg total) by mouth daily before breakfast., Disp: 90 tablet, Rfl: 1   fexofenadine (ALLEGRA) 180 MG tablet, Take 180 mg by mouth daily as needed for allergies or rhinitis., Disp: , Rfl:    fluconazole (DIFLUCAN) 150 MG tablet, TAKE 1 TABLET BY MOUTH ONE TIME PER WEEK, Disp: 6 tablet, Rfl: 2   fluticasone (FLONASE) 50 MCG/ACT nasal spray, Place 1 spray into both nostrils daily as needed for allergies. , Disp: , Rfl:    furosemide (LASIX) 20 MG tablet, TAKE 1 TABLET BY MOUTH TWICE A DAY, Disp: 180 tablet, Rfl: 1   gabapentin (NEURONTIN) 300 MG capsule, TAKE 2 CAPSULES BY MOUTH AT BEDTIME, Disp: 180 capsule, Rfl: 1   glucose blood test strip, Use to check CBG AC and QHS., Disp: 200 each, Rfl: 12   KLOR-CON M20 20 MEQ tablet, Take 1 tablet (20 mEq total) by mouth daily., Disp: 90 tablet, Rfl: 2   letrozole (FEMARA) 2.5 MG tablet, Take 1 tablet (2.5 mg total) by mouth daily., Disp: 90 tablet, Rfl: 3   losartan (COZAAR) 25 MG tablet, Take 1 tablet (25 mg total) by mouth 2 (two) times daily., Disp: 180 tablet, Rfl: 2   metFORMIN (GLUCOPHAGE) 500 MG tablet, Take 1 tablet (500 mg total) by mouth daily., Disp: 90 tablet, Rfl: 1   nebivolol (BYSTOLIC) 2.5 MG tablet, Take 1 tablet (2.5 mg total) by mouth daily., Disp: 90 tablet, Rfl: 2   OneTouch Delica Lancets 96E MISC, 1 each by Does not apply route daily., Disp: 100 each, Rfl: 1   pantoprazole (PROTONIX) 40 MG tablet, Take 1 tablet (40 mg total) by mouth daily., Disp: 90 tablet, Rfl: 3   rivaroxaban (XARELTO) 20 MG TABS tablet, TAKE 1 TABLET BY MOUTH EVERY DAY WITH SUPPER, Disp: 90 tablet, Rfl: 1   spironolactone (ALDACTONE) 25 MG tablet, Take 1 tablet (25 mg total) by mouth daily., Disp: 90 tablet, Rfl: 2   traMADol (ULTRAM) 50 MG tablet, Take 50 mg by mouth as needed for moderate pain., Disp: , Rfl:    colchicine  0.6 MG tablet, Take 1 tablet (0.6 mg total) by mouth 2 (two) times daily for 21 days., Disp: 42 tablet, Rfl: 0  Review of Systems:  Constitutional: Denies fever, chills, diaphoresis,  appetite change and fatigue.  HEENT: Denies photophobia, eye pain, redness, hearing loss, ear pain, congestion, sore throat, rhinorrhea, sneezing, mouth sores, trouble swallowing, neck pain, neck stiffness and tinnitus.   Respiratory: Denies SOB, DOE, cough, chest tightness,  and wheezing.   Cardiovascular: Denies chest pain, palpitations and leg swelling.  Gastrointestinal: Denies nausea, vomiting, abdominal pain, diarrhea, constipation, blood in stool and abdominal distention.  Genitourinary: Denies dysuria, urgency, frequency, hematuria, flank pain and difficulty urinating.  Endocrine: Denies: hot or cold intolerance, sweats, changes in hair or nails, polyuria, polydipsia. Musculoskeletal: Denies myalgias, back pain, joint swelling, arthralgias and gait problem.  Skin: Denies pallor, rash and wound.  Neurological: Denies dizziness, seizures, syncope, weakness, light-headedness, numbness and headaches.  Hematological: Denies adenopathy. Easy bruising, personal or family bleeding history  Psychiatric/Behavioral: Denies suicidal ideation, mood changes, confusion, nervousness, sleep disturbance and agitation    Physical Exam: Vitals:   08/11/21 1540  BP: 110/80  Pulse: 65  Temp: 98.2 F (36.8 C)  TempSrc: Oral  SpO2: 95%  Weight: 235 lb 6.4 oz (106.8 kg)    Body mass index is 37.99 kg/m.   Constitutional: NAD, calm, comfortable Eyes: PERRL, lids and conjunctivae normal ENMT: Mucous membranes are moist.  Respiratory: clear to auscultation bilaterally, no wheezing, no crackles. Normal respiratory effort. No accessory muscle use.  Cardiovascular: Regular rate and rhythm, no murmurs / rubs / gallops. No extremity edema.  Neurologic: Grossly intact and nonfocal Psychiatric: Normal judgment and insight.  Alert and oriented x 3. Normal mood.    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 -As she also has combined heart failure I believe is SGLT 2 inhibitor to be a good medication for her. -Will start on Jardiance 10 mg daily. -Return for follow-up in 3 months.  Needs flu shot  - Plan: Flu Vaccine QUAD High Dose(Fluad)  Persistent atrial fibrillation (HCC) Chronic anticoagulation- Xarelto -Appears to be in sinus rhythm today.  History of left breast cancer -Followed by oncology on anastrozole.  Chronic combined systolic and diastolic CHF (congestive heart failure) ([place]) -Compensated, followed by cardiology, on atorvastatin, losartan, nebivolol, spironolactone.  Time spent: 33 minutes reviewing chart, interviewing and examining patient and formulating plan of care.   Patient Instructions  -Nice seeing you today!!  -Flu vaccine today.  -Start jardiance 10 mg daily.  -Schedule follow up in 3 months.     Lelon Frohlich, MD [facility]

## 2021-08-11 NOTE — Patient Instructions (Signed)
-  Nice seeing you today!!  -Flu vaccine today.  -Start jardiance 10 mg daily.  -Schedule follow up in 3 months.

## 2021-08-20 ENCOUNTER — Ambulatory Visit: Payer: Medicare Other | Admitting: Hematology and Oncology

## 2021-08-24 ENCOUNTER — Ambulatory Visit: Payer: Medicare Other | Admitting: Hematology and Oncology

## 2021-08-24 ENCOUNTER — Ambulatory Visit: Payer: Medicare Other | Admitting: Physician Assistant

## 2021-08-27 ENCOUNTER — Ambulatory Visit: Payer: Medicare Other | Admitting: Hematology and Oncology

## 2021-08-28 ENCOUNTER — Telehealth: Payer: Self-pay | Admitting: Internal Medicine

## 2021-08-28 ENCOUNTER — Other Ambulatory Visit: Payer: Self-pay | Admitting: Internal Medicine

## 2021-08-28 DIAGNOSIS — M109 Gout, unspecified: Secondary | ICD-10-CM

## 2021-08-28 MED ORDER — COLCHICINE 0.6 MG PO TABS: 0.6000 mg | ORAL_TABLET | Freq: Two times a day (BID) | ORAL | 0 refills | Status: DC

## 2021-08-28 NOTE — Telephone Encounter (Signed)
Patient called to get something called in because she is having a gout flare up. She states she is unable to leave the house because she can barely walk and her dog has had surgery and she cannot leave her at home alone. Patient will need a callback when prescription is sent so she can tell her son as he will have to pick it up.    Please send medication to  [id number] - [place], [place] - [address]. AT [address] Phone:  [phone]  Fax:  [phone]       Please advise

## 2021-08-28 NOTE — Telephone Encounter (Signed)
Colchicine Rx sent.

## 2021-08-29 NOTE — Progress Notes (Incomplete)
Patient Care Team: Philip Aspen, Limmie Patricia, MD as PCP - General (Internal Medicine) Quintella Reichert, MD as PCP - Cardiology (Cardiology) Verner Chol, [facility] as Pharmacist (Pharmacist)  DIAGNOSIS: No diagnosis found.  SUMMARY OF ONCOLOGIC HISTORY: Oncology History  Malignant neoplasm of central portion of left breast in female, estrogen receptor positive (HCC)  2002 Initial Diagnosis   Left breast cancer treated with lumpectomy followed by adjuvant chemotherapy (does not remember chemotherapy drugs) and radiation.  Did not take antiestrogen therapy   07/11/2017 Relapse/Recurrence   Left breast skin papule; biopsy proven to be invasive mammary carcinoma ER 100% PR 90% positive HER-2 negative ratio 1.64; Right breast 1.5 cm indeterminate calcifications biopsy proven to be complex sclerosing lesion   08/15/2017 Surgery   Left breast excision: Invasive carcinoma involving the dermis, ER 100%, PR 90%, HER-2 negative ratio 1.64.  Right lumpectomy: Complex sclerosing lesion    10/27/2017 Surgery   Left simple mastectomy: Recurrent multifocal IDC grade 2 largest spanning 3.5 cm, low to high-grade DCIS, tumor involves nipple and epidermis, margins negative, left chest wall excision IDC involving the anterior margin ER 100%, PR 90%, HER-2 negative; T4 Nx stage 3b   11/14/2017 -  Anti-estrogen oral therapy   Letrozole 2.5 mg daily     CHIEF COMPLIANT: Follow-up of left breast cancer on letrozole therapy  INTERVAL HISTORY: Cynthia Herrera is a 75 y.o. with above-mentioned history of recurrent left breast cancer treated with lumpectomy followed by mastectomy, and who currently on oral antiestrogen therapy with letrozole. Mammogram on 10/02/2020 showed no evidence of malignancy. She presents to the clinic today for follow-up.   ALLERGIES:  is allergic to tikosyn [dofetilide], zolpidem, codeine, codeine phosphate, and methylprednisolone.  MEDICATIONS:  Current Outpatient Medications   Medication Sig Dispense Refill   atorvastatin (LIPITOR) 40 MG tablet TAKE 1 TABLET BY MOUTH EVERYDAY AT BEDTIME 90 tablet 1   Cholecalciferol (VITAMIN D3) 2000 units capsule Take 2,000 Units by mouth daily.     clotrimazole-betamethasone (LOTRISONE) cream Apply 1 application topically 2 (two) times daily. 30 g 0   colchicine 0.6 MG tablet Take 1 tablet (0.6 mg total) by mouth 2 (two) times daily for 21 days. 42 tablet 0   empagliflozin (JARDIANCE) 10 MG TABS tablet Take 1 tablet (10 mg total) by mouth daily before breakfast. 90 tablet 1   fexofenadine (ALLEGRA) 180 MG tablet Take 180 mg by mouth daily as needed for allergies or rhinitis.     fluconazole (DIFLUCAN) 150 MG tablet TAKE 1 TABLET BY MOUTH ONE TIME PER WEEK 6 tablet 2   fluticasone (FLONASE) 50 MCG/ACT nasal spray Place 1 spray into both nostrils daily as needed for allergies.      furosemide (LASIX) 20 MG tablet TAKE 1 TABLET BY MOUTH TWICE A DAY 180 tablet 1   gabapentin (NEURONTIN) 300 MG capsule TAKE 2 CAPSULES BY MOUTH AT BEDTIME 180 capsule 1   glucose blood test strip Use to check CBG AC and QHS. 200 each 12   KLOR-CON M20 20 MEQ tablet Take 1 tablet (20 mEq total) by mouth daily. 90 tablet 2   letrozole (FEMARA) 2.5 MG tablet Take 1 tablet (2.5 mg total) by mouth daily. 90 tablet 3   losartan (COZAAR) 25 MG tablet Take 1 tablet (25 mg total) by mouth 2 (two) times daily. 180 tablet 2   metFORMIN (GLUCOPHAGE) 500 MG tablet Take 1 tablet (500 mg total) by mouth daily. 90 tablet 1   nebivolol (BYSTOLIC) 2.5  MG tablet Take 1 tablet (2.5 mg total) by mouth daily. 90 tablet 2   OneTouch Delica Lancets 43P MISC 1 each by Does not apply route daily. 100 each 1   pantoprazole (PROTONIX) 40 MG tablet Take 1 tablet (40 mg total) by mouth daily. 90 tablet 3   rivaroxaban (XARELTO) 20 MG TABS tablet TAKE 1 TABLET BY MOUTH EVERY DAY WITH SUPPER 90 tablet 1   spironolactone (ALDACTONE) 25 MG tablet Take 1 tablet (25 mg total) by mouth  daily. 90 tablet 2   traMADol (ULTRAM) 50 MG tablet Take 50 mg by mouth as needed for moderate pain.     No current facility-administered medications for this visit.    PHYSICAL EXAMINATION: ECOG PERFORMANCE STATUS: {CHL ONC ECOG PS:[id number]}  There were no vitals filed for this visit. There were no vitals filed for this visit.  BREAST:*** No palpable masses or nodules in either right or left breasts. No palpable axillary supraclavicular or infraclavicular adenopathy no breast tenderness or nipple discharge. (exam performed in the presence of a chaperone)  LABORATORY DATA:  I have reviewed the data as listed CMP Latest Ref Rng & Units 03/26/2021 07/10/2020 01/01/2020  Glucose 65 - 99 mg/dL 171(H) 109(H) 166(H)  BUN 8 - 27 mg/dL $Remove'19 25 21  'cwfijGp$ Creatinine 0.57 - 1.00 mg/dL 1.15(H) 1.25(H) 1.14(H)  Sodium 134 - 144 mmol/L 137 142 140  Potassium 3.5 - 5.2 mmol/L 4.2 4.2 4.4  Chloride 96 - 106 mmol/L 101 105 103  CO2 20 - 29 mmol/L $RemoveB'21 21 22  'iajTrzCc$ Calcium 8.7 - 10.3 mg/dL 9.8 9.6 9.8  Total Protein 6.0 - 8.5 g/dL 6.3 - 6.6  Total Bilirubin 0.0 - 1.2 mg/dL 0.5 - 0.6  Alkaline Phos 44 - 121 IU/L 128(H) - 155(H)  AST 0 - 40 IU/L 13 - 14  ALT 0 - 32 IU/L 13 - 13    Lab Results  Component Value Date   WBC 8.5 03/26/2021   HGB 11.4 03/26/2021   HCT 34.4 03/26/2021   MCV 86 03/26/2021   PLT 323 03/26/2021   NEUTROABS 7.5 02/03/2017    ASSESSMENT & PLAN:  No problem-specific Assessment & Plan notes found for this encounter.    No orders of the defined types were placed in this encounter.  The patient has a good understanding of the overall plan. she agrees with it. she will call with any problems that may develop before the next visit here.  Total time spent: *** mins including face to face time and time spent for planning, charting and coordination of care  Rulon Eisenmenger, MD, MPH 08/29/2021  I, Thana Ates, am acting as scribe for Dr. Nicholas Lose.  {insert scribe  attestation}

## 2021-08-31 ENCOUNTER — Inpatient Hospital Stay: Payer: Medicare Other | Admitting: Hematology and Oncology

## 2021-08-31 NOTE — Assessment & Plan Note (Deleted)
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 Nonspecific pulmonary nodule left upper lobe 3 mm  Oncotype testing could not be performed because it was felt that the tumor was a recurrent cancer and Oncotype DX has no ability in this situation.   Recommendation: adjuvant antiestrogen therapy with letrozole 2.5 mg dailystarted 11/14/2017 (I offered her systemic chemotherapy but patient declined)  Letrozole toxicities: Hot flashes and sweats: Resolved Joint stiffness also improved  She is otherwise tolerating letrozole well.  Breast cancer surveillance: 1.Mammogram12/13/21: Benign breast density category C 2.breast exam11/04/2021: Benign  Left posterior chest wall nodule: CT chest 10/03/2019: No evidence of metastatic disease. Return to clinic 1 year for follow-up

## 2021-09-09 ENCOUNTER — Other Ambulatory Visit: Payer: Self-pay | Admitting: Internal Medicine

## 2021-09-09 ENCOUNTER — Ambulatory Visit: Payer: Medicare Other | Admitting: Cardiology

## 2021-09-09 DIAGNOSIS — E1151 Type 2 diabetes mellitus with diabetic peripheral angiopathy without gangrene: Secondary | ICD-10-CM

## 2021-09-09 DIAGNOSIS — I70209 Unspecified atherosclerosis of native arteries of extremities, unspecified extremity: Secondary | ICD-10-CM

## 2021-09-22 NOTE — Progress Notes (Signed)
Cardiology Office Note Date:  09/23/2021  Patient ID:  Cynthia Herrera 12/22/1945, MRN [id number] PCP:  Isaac Bliss, Rayford Halsted, MD  Cardiologist:  Dr. Radford Pax AHF: Dr. Haroldine Laws  Electrophysiologist: Dr. Rayann Heman    Chief Complaint:  6 mo  History of Present Illness: Cynthia Herrera is a 75 y.o. female with history of non-obstructive CAD, NICM (? If tachy-mediated 2/2 AF), HTN, HLD, chronic CHF (systolic), CBP, COPD, ILD (?amio), GERD, breast cancer treated, DM, AFib, OSA w/dental appliance  She saw HF team once in 2020, her EF was improved from prior echo with maintenance of SR, noted low dase BB 2/2 bradycardia.  Encouraged to follow up with Dr. [name] for dental appliance and improved OSA management.  She comes in today to be seen for Dr. Rayann Heman, last seen by him April 2022, c/o some postural dizziness of late.  No symptoms of AFib post ablation and off AAD. Her lasix reduced 2/2 orthostatic dizziness  TODAY She is doing well No unusual fatigue which is her Afib symptom, she does not think she has had any. No CP, palpitations or unusual SOB No difficulties with ADLS No near syncope or syncope Infrequent orthostatic dizziness since the chang e I her lasix  No bleeding or signs of bleeding   Afib and AAD hx Amiodarone intolerant 2/2 dizziness, stopped 2018 Tikosyn started May 2019 >> failed with QT prolongation during load despite down-titration to 190mcg PVI ablation Feb 2020  Past Medical History:  Diagnosis Date   Arthritis    Breast cancer, left breast ([place]) 2002   "then treated w/chemo and radiation"   Breast cancer, left breast ([place]) 07/14/2017   "tx'd w/mastectomy"   Chronic back pain    h/o lumbar stenosis; "no problem since my OR" (`10/27/2017)   Chronic systolic CHF (congestive heart failure) (HCC)    Complication of anesthesia    last surgery iv med when going to sleep"burned as injected"   COPD (chronic obstructive pulmonary disease) ([place])    no  inhalers    DCM (dilated cardiomyopathy) ([place])    EF 15-20% ? tachycardia induced - EF 35-40% by echo 05/2018   Dyspnea    with exertion   Gallstones    GERD (gastroesophageal reflux disease)    once in a while;depends on what she eats   History of bronchitis    "chronic when I smoked; no problem since I quit in 2002" (10/27/2017)   Hyperlipidemia    Hypertension    takes Losartan and Metoprolol.    Joint pain    Muscle spasm    takes Flexeril daily as needed    Nocturia    OSA (obstructive sleep apnea) 08/22/2018   Severe obstructive sleep apnea with an AHI 47.3/h and no significant central sleep apnea with moderate oxygen desaturations as low as 79%   Osteopenia    Persistent atrial fibrillation (HCC)    s/p TEE DCCV and repeat DCCV 11/14/2013 and 02/2018   Personal history of colonic polyps    adenomas 03 and 08   Pneumonia    hx   Seasonal allergies    Thyroid nodule    Type II diabetes mellitus ([place])    Vitamin D deficiency    takes Vit D    Past Surgical History:  Procedure Laterality Date   ATRIAL FIBRILLATION ABLATION N/A 12/12/2018   Procedure: ATRIAL FIBRILLATION ABLATION;  Surgeon: Thompson Grayer, MD;  Location: [facility];  Service: Cardiovascular;  Laterality: N/A;  AXILLARY LYMPH NODE DISSECTION Left 10/2001   persistent intramammary node /notes 03/09/2011   BACK SURGERY     BREAST BIOPSY Left 2002   BREAST BIOPSY Right 06/2017   "node bx was negative"   BREAST BIOPSY Left 06/2017   "positive for cancer"   BREAST LUMPECTOMY WITH RADIOACTIVE SEED LOCALIZATION Right 08/15/2017   Procedure: RIGHT BREAST LUMPECTOMY WITH RADIOACTIVE SEED LOCALIZATION;  Surgeon: Jovita Kussmaul, MD;  Location: [facility];  Service: General;  Laterality: Right;   CARDIAC CATHETERIZATION  2015   CARDIOVERSION N/A 11/12/2013   Procedure: CARDIOVERSION;  Surgeon: Thayer Headings, MD;  Location: [facility];  Service: Cardiovascular;  Laterality: N/A;  10:08  Dr. Marissa Nestle, anesthesia  present, [place]   60mg ,  propofol 50mg , IV for elective cardioversion....Dr. Cathie Olden delievered synch 120 joules with successful cardioversion to NSR   CARDIOVERSION N/A 11/12/2013   Procedure: CARDIOVERSION;  Surgeon: Thayer Headings, MD;  Location: [facility];  Service: Cardiovascular;  Laterality: N/A;   CARDIOVERSION N/A 11/14/2013   Procedure: CARDIOVERSION (BEDSIDE);  Surgeon: Larey Dresser, MD;  Location: [facility];  Service: Cardiovascular;  Laterality: N/A;   CARDIOVERSION N/A 02/22/2018   Procedure: CARDIOVERSION;  Surgeon: Sueanne Margarita, MD;  Location[facility];  Service: Cardiovascular;  Laterality: N/A;   CATARACT EXTRACTION W/ INTRAOCULAR LENS  IMPLANT, BILATERAL Bilateral ~ 2016   COLONOSCOPY  2003, September 2008, April 01, 2011   adenomas 03 and 08, polyp 12, diverticulosis   COLONOSCOPY WITH PROPOFOL N/A 11/16/2016   Procedure: COLONOSCOPY WITH PROPOFOL;  Surgeon: Gatha Mayer, MD;  Location: [facility] ENDOSCOPY;  Service: Endoscopy;  Laterality: N/A;   JOINT REPLACEMENT     LAPAROSCOPIC CHOLECYSTECTOMY  2004   LEFT AND RIGHT HEART CATHETERIZATION WITH CORONARY ANGIOGRAM N/A 02/01/2014   Procedure: LEFT AND RIGHT HEART CATHETERIZATION WITH CORONARY ANGIOGRAM;  Surgeon: Sueanne Margarita, MD;  Location: [place] CATH LAB;  Service: Cardiovascular;  Laterality: N/A;   LUMBAR LAMINECTOMY/DECOMPRESSION MICRODISCECTOMY N/A 03/19/2014   Procedure: LUMBAR LAMINECTOMY/DECOMPRESSION MICRODISCECTOMY 4 LEVEL;  Surgeon: Kristeen Miss, MD;  Location: [facility]URO ORS;  Service: Neurosurgery;  Laterality: N/A;  L1-2 L2-3 L3-4 L4-5 Laminectomy/Foraminotomy   MASS EXCISION Left 08/15/2017   Procedure: EXCISION OF LEFT BREAST MASS;  Surgeon: Jovita Kussmaul, MD;  Location: [place];  Service: General;  Laterality: Left;   MASTECTOMY Left 10/27/2017   MASTECTOMY PARTIAL / LUMPECTOMY W/ AXILLARY LYMPHADENECTOMY  05/08/2001   Archie Endo 03/09/2011   PORT-A-CATH REMOVAL  2003   PORTA CATH INSERTION  2002   TEE WITHOUT  CARDIOVERSION N/A 11/12/2013   Procedure: TRANSESOPHAGEAL ECHOCARDIOGRAM (TEE);  Surgeon: Thayer Headings, MD;  Location: [facility];  Service: Cardiovascular;  Laterality: N/A;  pt b/p low, pt buccal membranes very dry, lips scapped, pt c/o thirst. NPO since MN and iv FLUIDS TOTAL INFUSING AT TOTAL 20ML HR....  Dr. Cathie Olden order allow NS to bolus during procedure....very dry,NS bolus 250 ml total..pt responding well to meds.Marland Kitchen   TOTAL KNEE ARTHROPLASTY Left 2006   TOTAL KNEE ARTHROPLASTY Right 05/10/2016   Procedure: RIGHT TOTAL KNEE ARTHROPLASTY;  Surgeon: Gaynelle Arabian, MD;  Location: [facility];  Service: Orthopedics;  Laterality: Right;  With adductor block   TOTAL MASTECTOMY Left 10/27/2017   Procedure: LEFT MASTECTOMY;  Surgeon: Jovita Kussmaul, MD;  Location: [facility];  Service: General;  Laterality: Left;   TUBAL LIGATION      Current Outpatient Medications  Medication Sig Dispense Refill   atorvastatin (LIPITOR) 40 MG tablet TAKE  1 TABLET BY MOUTH EVERYDAY AT BEDTIME 90 tablet 1   Cholecalciferol (VITAMIN D3) 2000 units capsule Take 2,000 Units by mouth daily.     clotrimazole-betamethasone (LOTRISONE) cream Apply 1 application topically 2 (two) times daily. 30 g 0   colchicine 0.6 MG tablet Take 0.6 mg by mouth as needed.     doxycycline (VIBRAMYCIN) 100 MG capsule Take by mouth daily.     empagliflozin (JARDIANCE) 10 MG TABS tablet Take 1 tablet (10 mg total) by mouth daily before breakfast. 90 tablet 1   fexofenadine (ALLEGRA) 180 MG tablet Take 180 mg by mouth daily as needed for allergies or rhinitis.     fluconazole (DIFLUCAN) 150 MG tablet TAKE 1 TABLET BY MOUTH ONE TIME PER WEEK 6 tablet 2   fluticasone (FLONASE) 50 MCG/ACT nasal spray Place 1 spray into both nostrils daily as needed for allergies.      furosemide (LASIX) 20 MG tablet TAKE 1 TABLET BY MOUTH TWICE A DAY 180 tablet 1   gabapentin (NEURONTIN) 300 MG capsule TAKE 2 CAPSULES BY MOUTH AT BEDTIME 180 capsule 1   glucose blood  test strip Use to check CBG AC and QHS. 200 each 12   KLOR-CON M20 20 MEQ tablet Take 1 tablet (20 mEq total) by mouth daily. 90 tablet 2   letrozole (FEMARA) 2.5 MG tablet Take 1 tablet (2.5 mg total) by mouth daily. 90 tablet 3   losartan (COZAAR) 25 MG tablet Take 1 tablet (25 mg total) by mouth 2 (two) times daily. 180 tablet 2   metFORMIN (GLUCOPHAGE) 500 MG tablet TAKE 1 TABLET BY MOUTH TWICE A DAY WITH MEALS 180 tablet 1   nebivolol (BYSTOLIC) 2.5 MG tablet Take 1 tablet (2.5 mg total) by mouth daily. 90 tablet 2   OneTouch Delica Lancets 29B MISC 1 each by Does not apply route daily. 100 each 1   pantoprazole (PROTONIX) 40 MG tablet Take 1 tablet (40 mg total) by mouth daily. 90 tablet 3   rivaroxaban (XARELTO) 20 MG TABS tablet TAKE 1 TABLET BY MOUTH EVERY DAY WITH SUPPER 90 tablet 1   spironolactone (ALDACTONE) 25 MG tablet Take 1 tablet (25 mg total) by mouth daily. 90 tablet 2   traMADol (ULTRAM) 50 MG tablet Take 50 mg by mouth as needed for moderate pain.     No current facility-administered medications for this visit.    Allergies:   Tikosyn [dofetilide], Zolpidem, Codeine, Codeine phosphate, and Methylprednisolone   Social History:  The patient  reports that she quit smoking about 20 years ago. Her smoking use included cigarettes. She has a 52.50 pack-year smoking history. She has never used smokeless tobacco. She reports that she does not drink alcohol and does not use drugs.   Family History:  The patient's family history includes Alzheimer's disease in her sister; Dementia in her sister; Diabetes in her brother, father, mother, and sister; Heart disease in her brother and father; Irritable bowel syndrome in her brother; Liver cancer in her mother; Pancreatic cancer in her mother.  ROS:  Please see the history of present illness.    All other systems are reviewed and otherwise negative.   PHYSICAL EXAM:  VS:  BP 108/60   Pulse 63   Ht 5\' 6"  (1.676 m)   Wt 234 lb (106.1  kg)   SpO2 97%   BMI 37.77 kg/m  BMI: Body mass index is 37.77 kg/m. Well nourished, well developed, in no acute distress HEENT: normocephalic, atraumatic Neck: no  JVD, carotid bruits or masses Cardiac:  RRR; no significant murmurs, no rubs, or gallops Lungs:  CTA b/l, no wheezing, rhonchi or rales Abd: soft, nontender MS: no deformity or atrophy Ext: no edema Skin: warm and dry, no rash Neuro:  No gross deficits appreciated Psych: euthymic mood, full affect    EKG:  not done today  07/30/2020: TTE IMPRESSIONS  1. Left ventricular ejection fraction, by estimation, is 45 to 50%. Left  ventricular ejection fraction by 3D volume is 45 %. The left ventricle has  mildly decreased function. The left ventricle has no regional wall motion  abnormalities. Left ventricular   diastolic parameters are consistent with Grade I diastolic dysfunction  (impaired relaxation). Elevated left atrial pressure.   2. Right ventricular systolic function is normal. The right ventricular  size is normal. Tricuspid regurgitation signal is inadequate for assessing  PA pressure.   3. A small pericardial effusion is present. The pericardial effusion is  circumferential. There is no evidence of cardiac tamponade.   4. The mitral valve is grossly normal. Trivial mitral valve  regurgitation. No evidence of mitral stenosis.   5. The aortic valve is tricuspid. Aortic valve regurgitation is not  visualized. No aortic stenosis is present.   6. The inferior vena cava is normal in size with greater than 50%  respiratory variability, suggesting right atrial pressure of 3 mmHg.   Comparison(s): No significant change from prior study.    12/12/2018: EPS, ablation CONCLUSIONS: 1. Sinus rhythm upon presentation.  Of note, she has chronic SOB and is followed by pulmonary.  Her family noted her to be visibly dyspneic in walking into the hospital today.  She has severe OSA and does not use her CPAP. 2. Intracardiac  echo reveals a small R middle PV.  There was also a common ostium to the left superior and inferior PVs.  3. PV isolation of pulmonary veins with radiofrequency current.   Despite ablation, the right PVs could not be fully isolated today.  There appeared to be conduction near the left middle PV caraina.  Though some ablation was delivered in this location, it was felt that full ablation for isolation could not be achieved due to the risks of PVS. 4. No inducible arrhythmias following ablation both on and off of Isuprel 5. No early apparent complications.   04/10/2018: stress myoview Nuclear stress EF: 41%. No T wave inversion was noted during stress. There was no ST segment deviation noted during stress. This is an intermediate risk study.   Patchy poor tracer uptake. A few small fixed defects not corresponding to vascular distribution. No reversible ischemia. LVEF 41% with global hypokinesis. This is an intermediate risk study due to reduced LV systolic function  Recent Labs: 03/26/2021: ALT 13; BUN 19; Creatinine, Ser 1.15; Hemoglobin 11.4; Platelets 323; Potassium 4.2; Sodium 137  03/26/2021: Chol/HDL Ratio 3.8; Cholesterol, Total 136; HDL 36; LDL Chol Calc (NIH) 72; Triglycerides 161   CrCl cannot be calculated (Patient's most recent lab result is older than the maximum 21 days allowed.).   Wt Readings from Last 3 Encounters:  09/23/21 234 lb (106.1 kg)  08/11/21 235 lb 6.4 oz (106.8 kg)  06/19/21 235 lb 1.6 oz (106.6 kg)     Other studies reviewed: Additional studies/records reviewed today include: summarized above  ASSESSMENT AND PLAN:  Persistent AFib CHA2DS2Vasc is 5, on Xarelto, appropriately dosed No symptoms of her AFib  HTN Looks good  OSA Not addressed today  Chronic CHF (systolic) NICM No  symptoms or exam findings to suggest volume OL She has Dr. Radford Pax follow up in place   Labs today  Disposition: F/u with EP in 67mo, sooner if needed  Current medicines are  reviewed at length with the patient today.  The patient did not have any concerns regarding medicines.  Venetia Night, PA-C 09/23/2021 11:26 AM     [address] [place] [address] [phone] (office)  [phone] (fax)

## 2021-09-23 ENCOUNTER — Ambulatory Visit: Payer: Medicare Other | Admitting: Physician Assistant

## 2021-09-23 ENCOUNTER — Encounter: Payer: Self-pay | Admitting: Physician Assistant

## 2021-09-23 ENCOUNTER — Other Ambulatory Visit: Payer: Self-pay

## 2021-09-23 VITALS — BP 108/60 | HR 63 | Ht 66.0 in | Wt 234.0 lb

## 2021-09-23 DIAGNOSIS — I10 Essential (primary) hypertension: Secondary | ICD-10-CM

## 2021-09-23 DIAGNOSIS — I428 Other cardiomyopathies: Secondary | ICD-10-CM | POA: Diagnosis not present

## 2021-09-23 DIAGNOSIS — I4819 Other persistent atrial fibrillation: Secondary | ICD-10-CM | POA: Diagnosis not present

## 2021-09-23 DIAGNOSIS — Z79899 Other long term (current) drug therapy: Secondary | ICD-10-CM | POA: Diagnosis not present

## 2021-09-23 LAB — CBC
Hematocrit: 36.2 % (ref 34.0–46.6)
Hemoglobin: 12.4 g/dL (ref 11.1–15.9)
MCH: 30.5 pg (ref 26.6–33.0)
MCHC: 34.3 g/dL (ref 31.5–35.7)
MCV: 89 fL (ref 79–97)
Platelets: 281 10*3/uL (ref 150–450)
RBC: 4.06 x10E6/uL (ref 3.77–5.28)
RDW: 13.2 % (ref 11.7–15.4)
WBC: 8.4 10*3/uL (ref 3.4–10.8)

## 2021-09-23 LAB — BASIC METABOLIC PANEL
BUN/Creatinine Ratio: 20 (ref 12–28)
BUN: 24 mg/dL (ref 8–27)
CO2: 23 mmol/L (ref 20–29)
Calcium: 9.7 mg/dL (ref 8.7–10.3)
Chloride: 105 mmol/L (ref 96–106)
Creatinine, Ser: 1.21 mg/dL — ABNORMAL HIGH (ref 0.57–1.00)
Glucose: 174 mg/dL — ABNORMAL HIGH (ref 70–99)
Potassium: 4.3 mmol/L (ref 3.5–5.2)
Sodium: 142 mmol/L (ref 134–144)
eGFR: 47 mL/min/{1.73_m2} — ABNORMAL LOW (ref >59)

## 2021-09-23 NOTE — Patient Instructions (Signed)
  Your physician recommends that you continue on your current medications as directed. Please refer to the Current Medication list given to you today.  *If you need a refill on your cardiac medications before your next appointment, please call your pharmacy*   Lab Work:  BMET AND CB TODAY   If you have labs (blood work) drawn today and your tests are completely normal, you will receive your results only by: [place] (if you have MyChart) OR A paper copy in the mail If you have any lab test that is abnormal or we need to change your treatment, we will call you to review the results.   Testing/Procedures: NONE ORDERED  TODAY    Follow-Up: At [facility], you and your health needs are our priority.  As part of our continuing mission to provide you with exceptional heart care, we have created designated Provider Care Teams.  These Care Teams include your primary Cardiologist (physician) and Advanced Practice Providers (APPs -  Physician Assistants and Nurse Practitioners) who all work together to provide you with the care you need, when you need it.  We recommend signing up for the patient portal called "MyChart".  Sign up information is provided on this After Visit Summary.  MyChart is used to connect with patients for Virtual Visits (Telemedicine).  Patients are able to view lab/test results, encounter notes, upcoming appointments, etc.  Non-urgent messages can be sent to your provider as well.   To learn more about what you can do with MyChart, go to [email or web address].    Your next appointment:   6 month(s)  The format for your next appointment:   In Person  Provider:   You will see one of the following Advanced Practice Providers on your designated Care Team:   Tommye Standard, [place] }    Other Instructions

## 2021-10-05 ENCOUNTER — Telehealth: Payer: Self-pay | Admitting: Pharmacist

## 2021-10-05 ENCOUNTER — Other Ambulatory Visit: Payer: Self-pay | Admitting: Hematology and Oncology

## 2021-10-05 NOTE — Chronic Care Management (AMB) (Signed)
Chronic Care Management Pharmacy Assistant   Name: Cynthia Herrera  MRN: [id number] DOB: 08-05-1946  Reason for Encounter: Disease State / Hypertension Assessment Call   Conditions to be addressed/monitored: HTN  Recent office visits:  08/11/2021 Lelon Frohlich MD - Patient was seen for diabetes type 2 and additional issues. Started Empaglifozin 10 mg daily. Follow up in 3 months.  Recent consult visits:  09/23/2021 Baldwin [place] PA-C(cardiology) - Patient was seen for medication management and additional issues. Changed Colchicine to 0.6 mg as needed. Follow up in 6 months.  Hospital visits:  None  Medications: Outpatient Encounter Medications as of 10/05/2021  Medication Sig   atorvastatin (LIPITOR) 40 MG tablet TAKE 1 TABLET BY MOUTH EVERYDAY AT BEDTIME   Cholecalciferol (VITAMIN D3) 2000 units capsule Take 2,000 Units by mouth daily.   clotrimazole-betamethasone (LOTRISONE) cream Apply 1 application topically 2 (two) times daily.   colchicine 0.6 MG tablet Take 0.6 mg by mouth as needed.   doxycycline (VIBRAMYCIN) 100 MG capsule Take by mouth daily.   empagliflozin (JARDIANCE) 10 MG TABS tablet Take 1 tablet (10 mg total) by mouth daily before breakfast.   fexofenadine (ALLEGRA) 180 MG tablet Take 180 mg by mouth daily as needed for allergies or rhinitis.   fluconazole (DIFLUCAN) 150 MG tablet TAKE 1 TABLET BY MOUTH ONE TIME PER WEEK   fluticasone (FLONASE) 50 MCG/ACT nasal spray Place 1 spray into both nostrils daily as needed for allergies.    furosemide (LASIX) 20 MG tablet TAKE 1 TABLET BY MOUTH TWICE A DAY   gabapentin (NEURONTIN) 300 MG capsule TAKE 2 CAPSULES BY MOUTH AT BEDTIME   glucose blood test strip Use to check CBG AC and QHS.   KLOR-CON M20 20 MEQ tablet Take 1 tablet (20 mEq total) by mouth daily.   letrozole (FEMARA) 2.5 MG tablet TAKE 1 TABLET BY MOUTH EVERY DAY   losartan (COZAAR) 25 MG tablet Take 1 tablet (25 mg total) by mouth 2 (two)  times daily.   metFORMIN (GLUCOPHAGE) 500 MG tablet TAKE 1 TABLET BY MOUTH TWICE A DAY WITH MEALS   nebivolol (BYSTOLIC) 2.5 MG tablet Take 1 tablet (2.5 mg total) by mouth daily.   OneTouch Delica Lancets 18E MISC 1 each by Does not apply route daily.   pantoprazole (PROTONIX) 40 MG tablet Take 1 tablet (40 mg total) by mouth daily.   rivaroxaban (XARELTO) 20 MG TABS tablet TAKE 1 TABLET BY MOUTH EVERY DAY WITH SUPPER   spironolactone (ALDACTONE) 25 MG tablet Take 1 tablet (25 mg total) by mouth daily.   traMADol (ULTRAM) 50 MG tablet Take 50 mg by mouth as needed for moderate pain.   No facility-administered encounter medications on file as of 10/05/2021.  Fill History: ATORVASTATIN 40 MG TABLET 05/16/2021 90   COLCHICINE 0.6 MG TABLET 09/16/2021 21   JARDIANCE 10 MG TABLET 09/15/2021 30   FUROSEMIDE 20 MG TABLET 06/08/2021 90   LETROZOLE 2.5 MG TABLET 07/02/2021 90   09/16/2021 90   LOSARTAN POTASSIUM 25 MG TAB 08/08/2021 90   NEBIVOLOL 2.5 MG TABLET 09/09/2021 90   PANTOPRAZOLE SOD DR 40 MG TAB 09/28/2021 90   KLOR-CON M20 TABLET 07/02/2021 90   XARELTO 20 MG TABLET 07/11/2021 90   SPIRONOLACTONE 25 MG TABLET 08/02/2021 90   METFORMIN HCL 500 MG TABLET 06/02/2021 90   Reviewed chart prior to disease state call. Spoke with patient regarding BP  Recent Office Vitals: BP Readings from Last 3 Encounters:  09/23/21 108/60  08/11/21 110/80  06/19/21 120/80   Pulse Readings from Last 3 Encounters:  09/23/21 63  08/11/21 65  06/19/21 88    Wt Readings from Last 3 Encounters:  09/23/21 234 lb (106.1 kg)  08/11/21 235 lb 6.4 oz (106.8 kg)  06/19/21 235 lb 1.6 oz (106.6 kg)     Kidney Function Lab Results  Component Value Date/Time   CREATININE 1.21 (H) 09/23/2021 11:26 AM   CREATININE 1.15 (H) 03/26/2021 10:36 AM   CREATININE 1.06 (H) 08/19/2015 12:44 PM   CREATININE 1.46 (H) 04/05/2014 03:33 PM   GFR 62.33 02/03/2017 12:07 PM   GFRNONAA 42 (L) 07/10/2020  04:53 PM   GFRAA 49 (L) 07/10/2020 04:53 PM    BMP Latest Ref Rng & Units 09/23/2021 03/26/2021 07/10/2020  Glucose 70 - 99 mg/dL 174(H) 171(H) 109(H)  BUN 8 - 27 mg/dL 24 19 25   Creatinine 0.57 - 1.00 mg/dL 1.21(H) 1.15(H) 1.25(H)  BUN/Creat Ratio 12 - 28 20 17 20   Sodium 134 - 144 mmol/L 142 137 142  Potassium 3.5 - 5.2 mmol/L 4.3 4.2 4.2  Chloride 96 - 106 mmol/L 105 101 105  CO2 20 - 29 mmol/L 23 21 21   Calcium 8.7 - 10.3 mg/dL 9.7 9.8 9.6    Current antihypertensive regimen:  Losartan 25mg  - take 1 tab let by mouth two times daily. Nebivolol 2.5mg  - take 1 tablet by mouth daily  How often are you checking your Blood Pressure? Patient stated she does not feel she needs to check it at home because her blood pressure is always in normal range at her doctors appointments.   Current home BP readings: Patient is not checking   What recent interventions/DTPs have been made by any provider to improve Blood Pressure control since last CPP Visit: Patient states nothing new  Any recent hospitalizations or ED visits since last visit with CPP? No  What diet changes have been made to improve Blood Pressure Control?  Patient will have gritts with bacon or lunch for breakfast depending on how late she sleeps in and soup with crackers, sandwich with chips for dinner unless she feels like cooking.  What exercise is being done to improve your Blood Pressure Control?  Patient states she currently is just doing things around the house, housework and run to the store if needed.  Adherence Review: Is the patient currently on ACE/ARB medication? Yes Does the patient have >5 day gap between last estimated fill dates? No  Care Gaps: AWV - previous message sent to Ramond Craver Last BP - 108/60 on 09/23/2021 Hep C Screen - never done Dexascan - never done Foot exam - overdue  Star Rating Drugs: Atorvastatin 40mg  - last filled on 05/16/2021 90[facility] verified with Clair Gulling Losartan 25mg  - last filled  on 08/08/2021 [facility] at [facility] verified with Clair Gulling Metformin 500mg  - last filled on 06/02/2021 90[facility] at [facility] verified with Luis M. Cintron Pharmacist Assistant [phone]

## 2021-10-20 ENCOUNTER — Other Ambulatory Visit: Payer: Self-pay | Admitting: Internal Medicine

## 2021-10-20 DIAGNOSIS — M109 Gout, unspecified: Secondary | ICD-10-CM

## 2021-10-22 NOTE — Progress Notes (Signed)
Patient Care Team: Isaac Bliss, Rayford Halsted, MD as PCP - General (Internal Medicine) Sueanne Margarita, MD as PCP - Cardiology (Cardiology) Viona Gilmore, [facility] as Pharmacist (Pharmacist)  DIAGNOSIS:    ICD-10-CM   1. Malignant neoplasm of central portion of left breast in female, estrogen receptor positive ([place])  C50.112    Z17.0       SUMMARY OF ONCOLOGIC HISTORY: Oncology History  Malignant neoplasm of central portion of left breast in female, estrogen receptor positive ([place])  2002 Initial Diagnosis   Left breast cancer treated with lumpectomy followed by adjuvant chemotherapy (does not remember chemotherapy drugs) and radiation.  Did not take antiestrogen therapy   07/11/2017 Relapse/Recurrence   Left breast skin papule; biopsy proven to be invasive mammary carcinoma ER 100% PR 90% positive HER-2 negative ratio 1.64; Right breast 1.5 cm indeterminate calcifications biopsy proven to be complex sclerosing lesion   08/15/2017 Surgery   Left breast excision: Invasive carcinoma involving the dermis, ER 100%, PR 90%, HER-2 negative ratio 1.64.  Right lumpectomy: Complex sclerosing lesion    10/27/2017 Surgery   Left simple mastectomy: Recurrent multifocal IDC grade 2 largest spanning 3.5 cm, low to high-grade DCIS, tumor involves nipple and epidermis, margins negative, left chest wall excision IDC involving the anterior margin ER 100%, PR 90%, HER-2 negative; T4 Nx stage 3b   11/14/2017 -  Anti-estrogen oral therapy   Letrozole 2.5 mg daily     CHIEF COMPLIANT: Follow-up of left breast cancer on letrozole therapy  INTERVAL HISTORY: Cynthia Herrera is a 75 y.o. with above-mentioned history of recurrent left breast cancer treated with lumpectomy followed by mastectomy, and who currently on oral antiestrogen therapy with letrozole. She presents to the clinic today for annual follow-up.  She has not gotten her mammograms done yet.  She reports that she is having lots of pain and  discomfort in her hands.  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  ALLERGIES:  is allergic to tikosyn [dofetilide], zolpidem, codeine, codeine phosphate, and methylprednisolone.  MEDICATIONS:  Current Outpatient Medications  Medication Sig Dispense Refill   atorvastatin (LIPITOR) 40 MG tablet TAKE 1 TABLET BY MOUTH EVERYDAY AT BEDTIME 90 tablet 1   Cholecalciferol (VITAMIN D3) 2000 units capsule Take 2,000 Units by mouth daily.     clotrimazole-betamethasone (LOTRISONE) cream Apply 1 application topically 2 (two) times daily. 30 g 0   colchicine 0.6 MG tablet TAKE 1 TABLET BY MOUTH 2 TIMES DAILY FOR 21 DAYS. 42 tablet 0   doxycycline (VIBRAMYCIN) 100 MG capsule Take by mouth daily.     empagliflozin (JARDIANCE) 10 MG TABS tablet Take 1 tablet (10 mg total) by mouth daily before breakfast. 90 tablet 1   fexofenadine (ALLEGRA) 180 MG tablet Take 180 mg by mouth daily as needed for allergies or rhinitis.     fluconazole (DIFLUCAN) 150 MG tablet TAKE 1 TABLET BY MOUTH ONE TIME PER WEEK 6 tablet 2   fluticasone (FLONASE) 50 MCG/ACT nasal spray Place 1 spray into both nostrils daily as needed for allergies.      furosemide (LASIX) 20 MG tablet TAKE 1 TABLET BY MOUTH TWICE A DAY 180 tablet 1   gabapentin (NEURONTIN) 300 MG capsule TAKE 2 CAPSULES BY MOUTH AT BEDTIME 180 capsule 1   glucose blood test strip Use to check CBG AC and QHS. 200 each 12   KLOR-CON M20 20 MEQ tablet Take 1 tablet (20 mEq total) by mouth daily. [place]  tablet 2   letrozole (FEMARA) 2.5 MG tablet TAKE 1 TABLET BY MOUTH EVERY DAY 90 tablet 3   losartan (COZAAR) 25 MG tablet Take 1 tablet (25 mg total) by mouth 2 (two) times daily. 180 tablet 2   metFORMIN (GLUCOPHAGE) 500 MG tablet TAKE 1 TABLET BY MOUTH TWICE A DAY WITH MEALS 180 tablet 1   nebivolol (BYSTOLIC) 2.5 MG tablet Take 1 tablet (2.5 mg total) by mouth daily. 90 tablet 2   OneTouch Delica Lancets 75F MISC 1 each by Does not apply route  daily. 100 each 1   pantoprazole (PROTONIX) 40 MG tablet Take 1 tablet (40 mg total) by mouth daily. 90 tablet 3   rivaroxaban (XARELTO) 20 MG TABS tablet TAKE 1 TABLET BY MOUTH EVERY DAY WITH SUPPER 90 tablet 1   spironolactone (ALDACTONE) 25 MG tablet Take 1 tablet (25 mg total) by mouth daily. 90 tablet 2   traMADol (ULTRAM) 50 MG tablet Take 50 mg by mouth as needed for moderate pain.     No current facility-administered medications for this visit.    PHYSICAL EXAMINATION: ECOG PERFORMANCE STATUS: 1 - Symptomatic but completely ambulatory  Vitals:   10/23/21 1118  BP: (!) 116/50  Pulse: 62  Resp: 18  Temp: 97.6 F (36.4 C)  SpO2: 99%   Filed Weights   10/23/21 1118  Weight: 237 lb 1 oz (107.5 kg)      LABORATORY DATA:  I have reviewed the data as listed CMP Latest Ref Rng & Units 09/23/2021 03/26/2021 07/10/2020  Glucose 70 - 99 mg/dL 174(H) 171(H) 109(H)  BUN 8 - 27 mg/dL _0 Creatinine 0.57 - 1.00 mg/dL 1.21(H) 1.15(H) 1.25(H)  Sodium 134 - 144 mmol/L 142 137 142  Potassium 3.5 - 5.2 mmol/L 4.3 4.2 4.2  Chloride 96 - 106 mmol/L 105 101 105  CO2 20 - 29 mmol/L _1 Calcium 8.7 - 10.3 mg/dL 9.7 9.8 9.6  Total Protein 6.0 - 8.5 g/dL - 6.3 -  Total Bilirubin 0.0 - 1.2 mg/dL - 0.5 -  Alkaline Phos 44 - 121 IU/L - 128(H) -  AST 0 - 40 IU/L - 13 -  ALT 0 - 32 IU/L - 13 -    Lab Results  Component Value Date   WBC 8.4 09/23/2021   HGB 12.4 09/23/2021   HCT 36.2 09/23/2021   MCV 89 09/23/2021   PLT 281 09/23/2021   NEUTROABS 7.5 02/03/2017    ASSESSMENT & PLAN:  Malignant neoplasm of central portion of left breast in female, estrogen receptor positive ([place]) 10/27/2017: Left simple mastectomy: Recurrent multifocal IDC grade 2 largest spanning 3.5 cm, low to high-grade DCIS, tumor involves nipple and epidermis, margins negative, left chest wall excision IDC involving the anterior margin ER 100%, PR 90%, HER-2 negative; T4 Nx stage 3b (2002: Left breast  cancer treated with lumpectomy followed by adjuvant chemo and radiation, did not take antiestrogen therapy)   CT chest abdomen pelvis 09/07/2017: No specific evidence of metastases.  Nonspecific pulmonary nodule left upper lobe 3 mm   Oncotype testing could not be performed because it was felt that the tumor was a recurrent cancer and Oncotype DX has no ability in this situation.    Recommendation: adjuvant antiestrogen therapy with letrozole 2.5 mg daily started 11/14/2017 (I offered her systemic chemotherapy but patient declined)   Letrozole toxicities: Hot flashes and sweats: Resolved Joint stiffness: Continues to be a problem especially in her hands.  She was  contemplating on discontinuing treatment.  I encouraged her to continue with that for twice a week for at least another year.   Peripheral neuropathy: I discussed with her about adding PEA supplement to her regimen.   Breast cancer surveillance: 1.  Mammogram 10/06/2020: Benign breast density category C 2. breast exam 10/23/2021: Benign   Left posterior chest wall nodule: CT chest 04/07/2021: Upper lobe ill-defined nodularity unchanged from 2020.  No evidence of metastatic disease.     Return to clinic in 1 year for follow-up and after that she could be seen on an as-needed basis.    No orders of the defined types were placed in this encounter.  The patient has a good understanding of the overall plan. she agrees with it. she will call with any problems that may develop before the next visit here.  Total time spent: 20 mins including face to face time and time spent for planning, charting and coordination of care  Rulon Eisenmenger, MD, MPH 10/23/2021  I, Thana Ates, am acting as scribe for Dr. Nicholas Lose.  I have reviewed the above documentation for accuracy and completeness, and I agree with the above.

## 2021-10-23 ENCOUNTER — Inpatient Hospital Stay: Payer: Medicare Other | Attending: Hematology and Oncology | Admitting: Hematology and Oncology

## 2021-10-23 ENCOUNTER — Other Ambulatory Visit: Payer: Self-pay

## 2021-10-23 DIAGNOSIS — Z7984 Long term (current) use of oral hypoglycemic drugs: Secondary | ICD-10-CM | POA: Insufficient documentation

## 2021-10-23 DIAGNOSIS — C50112 Malignant neoplasm of central portion of left female breast: Secondary | ICD-10-CM | POA: Insufficient documentation

## 2021-10-23 DIAGNOSIS — G629 Polyneuropathy, unspecified: Secondary | ICD-10-CM | POA: Diagnosis not present

## 2021-10-23 DIAGNOSIS — Z79811 Long term (current) use of aromatase inhibitors: Secondary | ICD-10-CM | POA: Diagnosis not present

## 2021-10-23 DIAGNOSIS — R911 Solitary pulmonary nodule: Secondary | ICD-10-CM | POA: Diagnosis not present

## 2021-10-23 DIAGNOSIS — Z170 Estrogen receptor positive status [ER+]: Secondary | ICD-10-CM | POA: Diagnosis not present

## 2021-10-23 DIAGNOSIS — E1140 Type 2 diabetes mellitus with diabetic neuropathy, unspecified: Secondary | ICD-10-CM | POA: Diagnosis not present

## 2021-10-23 DIAGNOSIS — Z79899 Other long term (current) drug therapy: Secondary | ICD-10-CM | POA: Insufficient documentation

## 2021-10-23 DIAGNOSIS — Z7901 Long term (current) use of anticoagulants: Secondary | ICD-10-CM | POA: Insufficient documentation

## 2021-10-23 NOTE — Assessment & Plan Note (Signed)
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 Nonspecific pulmonary nodule left upper lobe 3 mm  Oncotype testing could not be performed because it was felt that the tumor was a recurrent cancer and Oncotype DX has no ability in this situation.   Recommendation: adjuvant antiestrogen therapy with letrozole 2.5 mg dailystarted 11/14/2017 (I offered her systemic chemotherapy but patient declined)  Letrozole toxicities: Hot flashes and sweats: Resolved Joint stiffness also improved  She is otherwise tolerating letrozole well.  Breast cancer surveillance: 1.Mammogram12/13/2021: Benign breast density category C 2.breast exam12/30/2022: Benign  Left posterior chest wall nodule: CT chest 04/07/2021: Upper lobe ill-defined nodularity unchanged from 2020.  No evidence of metastatic disease.   Return to clinic in 1 year for follow-up

## 2021-11-05 ENCOUNTER — Telehealth: Payer: Self-pay | Admitting: Internal Medicine

## 2021-11-05 NOTE — Telephone Encounter (Signed)
Patient called to see if she could something sent in for her chest congestion. Patient states that she has been sick for the past two weeks and has tried medications but nothing will kick it. Patient declined appointment       Please send to  [id number] - [place], [place]. AT [address] Phone:  [phone]  Fax:  [phone]          Please advise

## 2021-11-09 NOTE — Telephone Encounter (Signed)
Pt declined appt. And stated she will discuss sx at dm f/u appt.

## 2021-11-11 ENCOUNTER — Ambulatory Visit (INDEPENDENT_AMBULATORY_CARE_PROVIDER_SITE_OTHER): Payer: Medicare Other | Admitting: Internal Medicine

## 2021-11-11 VITALS — BP 110/68 | HR 67 | Temp 97.6°F | Resp 16 | Ht 66.0 in | Wt 229.0 lb

## 2021-11-11 DIAGNOSIS — I70209 Unspecified atherosclerosis of native arteries of extremities, unspecified extremity: Secondary | ICD-10-CM | POA: Diagnosis not present

## 2021-11-11 DIAGNOSIS — J209 Acute bronchitis, unspecified: Secondary | ICD-10-CM | POA: Diagnosis not present

## 2021-11-11 DIAGNOSIS — E1151 Type 2 diabetes mellitus with diabetic peripheral angiopathy without gangrene: Secondary | ICD-10-CM

## 2021-11-11 DIAGNOSIS — J440 Chronic obstructive pulmonary disease with acute lower respiratory infection: Secondary | ICD-10-CM | POA: Diagnosis not present

## 2021-11-11 LAB — POCT GLYCOSYLATED HEMOGLOBIN (HGB A1C): Hemoglobin A1C: 6.9 % — AB (ref 4.0–5.6)

## 2021-11-11 MED ORDER — AZITHROMYCIN 250 MG PO TABS: ORAL_TABLET | ORAL | 0 refills | Status: AC

## 2021-11-11 NOTE — Progress Notes (Signed)
Established Patient Office Visit     This visit occurred during the SARS-CoV-2 public health emergency.  Safety protocols were in place, including screening questions prior to the visit, additional usage of staff PPE, and extensive cleaning of exam room while observing appropriate contact time as indicated for disinfecting solutions.    CC/Reason for Visit: 72-month follow-up chronic medical conditions  HPI: Cynthia Herrera is a 76 y.o. female who is coming in today for the above mentioned reasons. Past Medical History is significant for: Type 2 diabetes with peripheral neuropathy, A. fib and chronic combined heart failure followed by cardiology, history of breast cancer followed by oncology on anastrozole, she also has a history of COPD and chronic dyspnea on exertion, hypertension, hyperlipidemia, obstructive sleep apnea and osteopenia.  At last visit she was started on Jardiance 10 mg.  She states it is very expensive and she will not be able to refill it again.  She has what appears to have been a URI.  She continues to have significant lung congestion and cough.  She feels a "rattling in her chest" specifically at nighttime.  She does have a history of COPD.   Past Medical/Surgical History: Past Medical History:  Diagnosis Date   Arthritis    Breast cancer, left breast ([place]) 2002   "then treated w/chemo and radiation"   Breast cancer, left breast ([place]) 07/14/2017   "tx'd w/mastectomy"   Chronic back pain    h/o lumbar stenosis; "no problem since my OR" (`10/27/2017)   Chronic systolic CHF (congestive heart failure) (HCC)    Complication of anesthesia    last surgery iv med when going to sleep"burned as injected"   COPD (chronic obstructive pulmonary disease) ([place])    no inhalers    DCM (dilated cardiomyopathy) ([place])    EF 15-20% ? tachycardia induced - EF 35-40% by echo 05/2018   Dyspnea    with exertion   Gallstones    GERD (gastroesophageal reflux disease)    once in a  while;depends on what she eats   History of bronchitis    "chronic when I smoked; no problem since I quit in 2002" (10/27/2017)   Hyperlipidemia    Hypertension    takes Losartan and Metoprolol.    Joint pain    Muscle spasm    takes Flexeril daily as needed    Nocturia    OSA (obstructive sleep apnea) 08/22/2018   Severe obstructive sleep apnea with an AHI 47.3/h and no significant central sleep apnea with moderate oxygen desaturations as low as 79%   Osteopenia    Persistent atrial fibrillation (HCC)    s/p TEE DCCV and repeat DCCV 11/14/2013 and 02/2018   Personal history of colonic polyps    adenomas 03 and 08   Pneumonia    hx   Seasonal allergies    Thyroid nodule    Type II diabetes mellitus ([place])    Vitamin D deficiency    takes Vit D    Past Surgical History:  Procedure Laterality Date   ATRIAL FIBRILLATION ABLATION N/A 12/12/2018   Procedure: ATRIAL FIBRILLATION ABLATION;  Surgeon: Thompson Grayer, MD;  Location: [place] [facility];  Service: Cardiovascular;  Laterality: N/A;   AXILLARY LYMPH NODE DISSECTION Left 10/2001   persistent intramammary node /notes 03/09/2011   BACK SURGERY     BREAST BIOPSY Left 2002   BREAST BIOPSY Right 06/2017   "node bx was negative"   BREAST BIOPSY Left 06/2017   "  positive for cancer"   BREAST LUMPECTOMY WITH RADIOACTIVE SEED LOCALIZATION Right 08/15/2017   Procedure: RIGHT BREAST LUMPECTOMY WITH RADIOACTIVE SEED LOCALIZATION;  Surgeon: Jovita Kussmaul, MD;  Location: [place];  Service: General;  Laterality: Right;   CARDIAC CATHETERIZATION  2015   CARDIOVERSION N/A 11/12/2013   Procedure: CARDIOVERSION;  Surgeon: Thayer Headings, MD;  Location: [place];  Service: Cardiovascular;  Laterality: N/A;  10:08  Dr. Marissa Nestle, anesthesia present, [place]   60mg ,  propofol 50mg , IV for elective cardioversion....Dr. Cathie Olden delievered synch 120 joules with successful cardioversion to NSR   CARDIOVERSION N/A 11/12/2013   Procedure: CARDIOVERSION;   Surgeon: Thayer Headings, MD;  Location: [place];  Service: Cardiovascular;  Laterality: N/A;   CARDIOVERSION N/A 11/14/2013   Procedure: CARDIOVERSION (BEDSIDE);  Surgeon: Larey Dresser, MD;  Location: [place];  Service: Cardiovascular;  Laterality: N/A;   CARDIOVERSION N/A 02/22/2018   Procedure: CARDIOVERSION;  Surgeon: Sueanne Margarita, MD;  Location[facility];  Service: Cardiovascular;  Laterality: N/A;   CATARACT EXTRACTION W/ INTRAOCULAR LENS  IMPLANT, BILATERAL Bilateral ~ 2016   COLONOSCOPY  2003, September 2008, April 01, 2011   adenomas 03 and 08, polyp 12, diverticulosis   COLONOSCOPY WITH PROPOFOL N/A 11/16/2016   Procedure: COLONOSCOPY WITH PROPOFOL;  Surgeon: Gatha Mayer, MD;  Location: [facility];  Service: Endoscopy;  Laterality: N/A;   JOINT REPLACEMENT     LAPAROSCOPIC CHOLECYSTECTOMY  2004   LEFT AND RIGHT HEART CATHETERIZATION WITH CORONARY ANGIOGRAM N/A 02/01/2014   Procedure: LEFT AND RIGHT HEART CATHETERIZATION WITH CORONARY ANGIOGRAM;  Surgeon: Sueanne Margarita, MD;  Location: [place] CATH LAB;  Service: Cardiovascular;  Laterality: N/A;   LUMBAR LAMINECTOMY/DECOMPRESSION MICRODISCECTOMY N/A 03/19/2014   Procedure: LUMBAR LAMINECTOMY/DECOMPRESSION MICRODISCECTOMY 4 LEVEL;  Surgeon: Kristeen Miss, MD;  Location: [place] [facility];  Service: Neurosurgery;  Laterality: N/A;  L1-2 L2-3 L3-4 L4-5 Laminectomy/Foraminotomy   MASS EXCISION Left 08/15/2017   Procedure: EXCISION OF LEFT BREAST MASS;  Surgeon: Jovita Kussmaul, MD;  Location: [place];  Service: General;  Laterality: Left;   MASTECTOMY Left 10/27/2017   MASTECTOMY PARTIAL / LUMPECTOMY W/ AXILLARY LYMPHADENECTOMY  05/08/2001   Archie Endo 03/09/2011   PORT-A-CATH REMOVAL  2003   PORTA CATH INSERTION  2002   TEE WITHOUT CARDIOVERSION N/A 11/12/2013   Procedure: TRANSESOPHAGEAL ECHOCARDIOGRAM (TEE);  Surgeon: Thayer Headings, MD;  Location: [facility];  Service: Cardiovascular;  Laterality: N/A;  pt b/p low, pt buccal membranes very  dry, lips scapped, pt c/o thirst. NPO since MN and iv FLUIDS TOTAL INFUSING AT TOTAL 20ML HR....  Dr. Cathie Olden order allow NS to bolus during procedure....very dry,NS bolus 250 ml total..pt responding well to meds.Marland Kitchen   TOTAL KNEE ARTHROPLASTY Left 2006   TOTAL KNEE ARTHROPLASTY Right 05/10/2016   Procedure: RIGHT TOTAL KNEE ARTHROPLASTY;  Surgeon: Gaynelle Arabian, MD;  Location: [facility];  Service: Orthopedics;  Laterality: Right;  With adductor block   TOTAL MASTECTOMY Left 10/27/2017   Procedure: LEFT MASTECTOMY;  Surgeon: Jovita Kussmaul, MD;  Location: [place];  Service: General;  Laterality: Left;   TUBAL LIGATION      Social History:  reports that she quit smoking about 20 years ago. Her smoking use included cigarettes. She has a 52.50 pack-year smoking history. She has never used smokeless tobacco. She reports that she does not drink alcohol and does not use drugs.  Allergies: Allergies  Allergen Reactions   Tikosyn [Dofetilide]     Prolonged QT/QTc  Zolpidem Other (See Comments)    Hallucinations, up walking around ? Hallucinations, up walking around   Codeine Other (See Comments)   Codeine Phosphate Nausea And Vomiting   Methylprednisolone Other (See Comments)    Felt really weird w the high dose oral steroid, but tolerates low doses or oral steroid Felt really weird w the high dose oral steroid, but tolerates low doses or oral steroid    Family History:  Family History  Problem Relation Age of Onset   Dementia Sister    Diabetes Sister    Alzheimer's disease Sister    Diabetes Brother        x 2   Heart disease Brother    Irritable bowel syndrome Brother    Liver cancer Mother    Diabetes Mother    Pancreatic cancer Mother    Diabetes Father    Heart disease Father      Current Outpatient Medications:    azithromycin (ZITHROMAX) 250 MG tablet, Take 2 tablets on day 1, then 1 tablet daily on days 2 through 5, Disp: 6 tablet, Rfl: 0   atorvastatin (LIPITOR) 40 MG tablet,  TAKE 1 TABLET BY MOUTH EVERYDAY AT BEDTIME, Disp: 90 tablet, Rfl: 1   Cholecalciferol (VITAMIN D3) 2000 units capsule, Take 2,000 Units by mouth daily., Disp: , Rfl:    clotrimazole-betamethasone (LOTRISONE) cream, Apply 1 application topically 2 (two) times daily., Disp: 30 g, Rfl: 0   colchicine 0.6 MG tablet, TAKE 1 TABLET BY MOUTH 2 TIMES DAILY FOR 21 DAYS., Disp: 42 tablet, Rfl: 0   empagliflozin (JARDIANCE) 10 MG TABS tablet, Take 1 tablet (10 mg total) by mouth daily before breakfast., Disp: 90 tablet, Rfl: 1   fexofenadine (ALLEGRA) 180 MG tablet, Take 180 mg by mouth daily as needed for allergies or rhinitis., Disp: , Rfl:    fluconazole (DIFLUCAN) 150 MG tablet, TAKE 1 TABLET BY MOUTH ONE TIME PER WEEK, Disp: 6 tablet, Rfl: 2   fluticasone (FLONASE) 50 MCG/ACT nasal spray, Place 1 spray into both nostrils daily as needed for allergies. , Disp: , Rfl:    furosemide (LASIX) 20 MG tablet, TAKE 1 TABLET BY MOUTH TWICE A DAY, Disp: 180 tablet, Rfl: 1   gabapentin (NEURONTIN) 300 MG capsule, TAKE 2 CAPSULES BY MOUTH AT BEDTIME, Disp: 180 capsule, Rfl: 1   glucose blood test strip, Use to check CBG AC and QHS., Disp: 200 each, Rfl: 12   KLOR-CON M20 20 MEQ tablet, Take 1 tablet (20 mEq total) by mouth daily., Disp: 90 tablet, Rfl: 2   letrozole (FEMARA) 2.5 MG tablet, TAKE 1 TABLET BY MOUTH EVERY DAY, Disp: 90 tablet, Rfl: 3   losartan (COZAAR) 25 MG tablet, Take 1 tablet (25 mg total) by mouth 2 (two) times daily., Disp: 180 tablet, Rfl: 2   metFORMIN (GLUCOPHAGE) 500 MG tablet, TAKE 1 TABLET BY MOUTH TWICE A DAY WITH MEALS, Disp: 180 tablet, Rfl: 1   nebivolol (BYSTOLIC) 2.5 MG tablet, Take 1 tablet (2.5 mg total) by mouth daily., Disp: 90 tablet, Rfl: 2   OneTouch Delica Lancets 64Q MISC, 1 each by Does not apply route daily., Disp: 100 each, Rfl: 1   pantoprazole (PROTONIX) 40 MG tablet, Take 1 tablet (40 mg total) by mouth daily., Disp: 90 tablet, Rfl: 3   rivaroxaban (XARELTO) 20 MG TABS  tablet, TAKE 1 TABLET BY MOUTH EVERY DAY WITH SUPPER, Disp: 90 tablet, Rfl: 1   spironolactone (ALDACTONE) 25 MG tablet, Take 1 tablet (25 mg  total) by mouth daily., Disp: 90 tablet, Rfl: 2   traMADol (ULTRAM) 50 MG tablet, Take 50 mg by mouth as needed for moderate pain., Disp: , Rfl:   Review of Systems:  Constitutional: Denies fever, chills, diaphoresis, appetite change. HEENT: Denies photophobia, eye pain, redness, hearing loss, ear pain,  mouth sores, trouble swallowing, neck pain, neck stiffness and tinnitus.   Respiratory: Denies  chest tightness,  and wheezing.   Cardiovascular: Denies chest pain, palpitations and leg swelling.  Gastrointestinal: Denies nausea, vomiting, abdominal pain, diarrhea, constipation, blood in stool and abdominal distention.  Genitourinary: Denies dysuria, urgency, frequency, hematuria, flank pain and difficulty urinating.  Endocrine: Denies: hot or cold intolerance, sweats, changes in hair or nails, polyuria, polydipsia. Musculoskeletal: Denies myalgias, back pain, joint swelling, arthralgias and gait problem.  Skin: Denies pallor, rash and wound.  Neurological: Denies dizziness, seizures, syncope, weakness, light-headedness, numbness and headaches.  Hematological: Denies adenopathy. Easy bruising, personal or family bleeding history  Psychiatric/Behavioral: Denies suicidal ideation, mood changes, confusion, nervousness, sleep disturbance and agitation    Physical Exam: Vitals:   11/11/21 1138  BP: 110/68  Pulse: 67  Resp: 16  Temp: 97.6 F (36.4 C)  SpO2: 95%  Weight: 229 lb (103.9 kg)  Height: 5\' 6"  (1.676 m)    Body mass index is 36.96 kg/m.   Constitutional: NAD, calm, comfortable Eyes: PERRL, lids and conjunctivae normal ENMT: Mucous membranes are moist.  Respiratory: No wheezing, bilateral rhonchi, normal respiratory effort. No accessory muscle use.  Cardiovascular: Regular rate and rhythm, no murmurs / rubs / gallops. No extremity  edema.  Neurologic: Grossly intact and nonfocal Psychiatric: Normal judgment and insight. Alert and oriented x 3. Normal mood.    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 -I have provided Jardiance samples and will see if our pharmacist can assist with some form of patient assistance program.  Acute bronchitis with COPD ([place])  - Plan: azithromycin (ZITHROMAX) 250 MG tablet -She does have chest congestion and cough, given her history of COPD and her acute on chronic dyspnea on exertion, I will give her a Z-Pak, she declines cough suppressants.  Time spent: 31 minutes reviewing chart, interviewing and examining patient and formulating plan of care.    Lelon Frohlich, MD [facility]

## 2021-11-13 ENCOUNTER — Other Ambulatory Visit: Payer: Self-pay | Admitting: General Surgery

## 2021-11-13 DIAGNOSIS — Z1231 Encounter for screening mammogram for malignant neoplasm of breast: Secondary | ICD-10-CM

## 2021-11-24 ENCOUNTER — Other Ambulatory Visit: Payer: Self-pay | Admitting: Cardiology

## 2021-11-27 ENCOUNTER — Other Ambulatory Visit: Payer: Self-pay | Admitting: Cardiology

## 2021-11-27 ENCOUNTER — Telehealth: Payer: Self-pay | Admitting: Pharmacist

## 2021-11-27 NOTE — Telephone Encounter (Signed)
Called patient per request from PCP to inquire about needing patient assistance for Jardiance. Left a voicemail requesting a call back.

## 2021-11-30 NOTE — Telephone Encounter (Signed)
Patient returned call for [facility]. I let patient know that she was unavailable and she would give a callback when she was available. Patient verbalized understanding.     Please advise

## 2021-11-30 NOTE — Telephone Encounter (Signed)
Called patient back. Patient will likely qualify for Jardiance patient assistance. Plan to mail application this week to patient. Advised her to be on the lookout for the application and to mail it back or drop it off for me.

## 2021-12-02 ENCOUNTER — Telehealth: Payer: Self-pay | Admitting: Pharmacist

## 2021-12-02 NOTE — Chronic Care Management (AMB) (Signed)
° ° °  Chronic Care Management Pharmacy Assistant   Name: Cynthia Herrera  MRN: [id number] DOB: April 17, 1946  Reason for Encounter: Complete patient assistance for for Jardiance. Application complete and in the patient assistance folder for [name] to complete the process.     [place] Pharmacist Assistant [phone]

## 2021-12-10 ENCOUNTER — Telehealth: Payer: Self-pay | Admitting: Pharmacist

## 2021-12-10 ENCOUNTER — Ambulatory Visit
Admission: RE | Admit: 2021-12-10 | Discharge: 2021-12-10 | Disposition: A | Discharge status: Home / Self Care | Payer: Medicare Other | Source: Ambulatory Visit | Attending: General Surgery | Admitting: General Surgery

## 2021-12-10 ENCOUNTER — Other Ambulatory Visit: Payer: Self-pay

## 2021-12-10 DIAGNOSIS — Z1231 Encounter for screening mammogram for malignant neoplasm of breast: Secondary | ICD-10-CM

## 2021-12-10 NOTE — Chronic Care Management (AMB) (Signed)
° ° °  Chronic Care Management Pharmacy Assistant   Name: Cynthia Herrera  MRN: [id number] DOB: 12/08/1945  12/14/2021 APPOINTMENT REMINDER   [name] was called as a reminded of her upcoming appointment with [name], Pharm. D, patient requested the appointment be rescheduled due to a conflict with another appointment. Patient has been rescheduled to 02/24/2022.  Care Gaps: AWV - message sent to Ramond Craver Last BP - 110/68 on 11/11/2021 Last A1C - 7.4 on 08/11/2021 Hep C Screen - never done Shingrix - never done Dexascan - never done Foot exam - overdue Covid booster - overdue Colonoscopy - overdue  Star Rating Drug: Atorvastatin 40mg  - last [facility]16/[facility]21 [facility] verified with Estill Bamberg Losartan 25mg  - last filled on 11/03/2021 [facility]  Metformin 500mg  - last filled on 06/02/2021 [facility] verified with Estill Bamberg  Any gaps in medications fill history? Yes  [place] Pharmacist Assistant [phone]

## 2021-12-14 ENCOUNTER — Telehealth: Payer: Medicare Other

## 2021-12-14 DIAGNOSIS — E119 Type 2 diabetes mellitus without complications: Secondary | ICD-10-CM | POA: Diagnosis not present

## 2021-12-14 DIAGNOSIS — H26493 Other secondary cataract, bilateral: Secondary | ICD-10-CM | POA: Diagnosis not present

## 2021-12-14 DIAGNOSIS — H40013 Open angle with borderline findings, low risk, bilateral: Secondary | ICD-10-CM | POA: Diagnosis not present

## 2021-12-14 DIAGNOSIS — Z961 Presence of intraocular lens: Secondary | ICD-10-CM | POA: Diagnosis not present

## 2021-12-14 DIAGNOSIS — H524 Presbyopia: Secondary | ICD-10-CM | POA: Diagnosis not present

## 2021-12-14 LAB — HM DIABETES EYE EXAM

## 2021-12-15 ENCOUNTER — Telehealth: Payer: Self-pay | Admitting: Internal Medicine

## 2021-12-15 NOTE — Telephone Encounter (Signed)
Patient is calling stating that she came down with the flu and a sinus infection 3 weeks ago. Ever since she has had excessive fatigue. No other symptoms are present. Patient is wanting to know if this could be a sign of her Afib acting up. Please advise.

## 2021-12-15 NOTE — Telephone Encounter (Signed)
Returned call to Pt.  Advised would need an EKG to determine if afib was causing fatigue.  Pt will come to office tomorrow 12/16/21 at 2:00 pm for an EKG.  Pt in agreement.

## 2021-12-16 ENCOUNTER — Ambulatory Visit: Payer: Medicare Other

## 2021-12-16 ENCOUNTER — Other Ambulatory Visit: Payer: Self-pay

## 2021-12-16 VITALS — HR 66 | Ht 66.0 in

## 2021-12-16 DIAGNOSIS — E785 Hyperlipidemia, unspecified: Secondary | ICD-10-CM | POA: Diagnosis not present

## 2021-12-16 DIAGNOSIS — I5042 Chronic combined systolic (congestive) and diastolic (congestive) heart failure: Secondary | ICD-10-CM | POA: Diagnosis not present

## 2021-12-16 DIAGNOSIS — R5383 Other fatigue: Secondary | ICD-10-CM | POA: Diagnosis not present

## 2021-12-16 DIAGNOSIS — I4819 Other persistent atrial fibrillation: Secondary | ICD-10-CM

## 2021-12-16 MED ORDER — ATORVASTATIN CALCIUM 40 MG PO TABS: ORAL_TABLET | ORAL | 1 refills | Status: DC

## 2021-12-16 NOTE — Progress Notes (Signed)
Reason for visit: Pt contacted [facility] yesterday to report ongoing fatigue for a few weeks.  Also states she had a recent sinus infection. Per review of Pt's medical history, Pt historically suffers from fatigue when she is in afib.  Pt is unable to do an EKG from home, so advised she come in for an EKG to determine if she is in afib.  Name of MD requesting visit: Dr. Rayann Heman  H&P: Pt with history of atrial fibrillation and chronic heart failure  ROS related to problem: Pt complaining of fatigue.  Historically has fatigue when she is in afib.  She also endorses that she has had increased shortness of breath with activity.  Recent sinus infection-states "I was really sick".  Assessment and plan per MD: EKG performed and reviewed by DOD.  NSR with PVC.  Will check lab work:  CBC, CMP, TSH and BNP.  Follow up based on lab results.  Pt has f/u scheduled with Dr. Radford Pax in March.

## 2021-12-16 NOTE — Patient Instructions (Signed)
Medication Instructions:  Your physician recommends that you continue on your current medications as directed. Please refer to the Current Medication list given to you today.  Labwork: You will get lab work today:  CBC, CMP, TSH and BNP  Testing/Procedures: None ordered.  Follow-Up: To be determined    Any Other Special Instructions Will Be Listed Below (If Applicable).  If you need a refill on your cardiac medications before your next appointment, please call your pharmacy.

## 2021-12-17 ENCOUNTER — Encounter: Payer: Self-pay | Admitting: Internal Medicine

## 2021-12-17 ENCOUNTER — Other Ambulatory Visit: Payer: Self-pay | Admitting: Cardiology

## 2021-12-17 LAB — COMPREHENSIVE METABOLIC PANEL
ALT: 13 IU/L (ref 0–32)
AST: 14 IU/L (ref 0–40)
Albumin/Globulin Ratio: 1.6 (ref 1.2–2.2)
Albumin: 4.1 g/dL (ref 3.7–4.7)
Alkaline Phosphatase: 138 IU/L — ABNORMAL HIGH (ref 44–121)
BUN/Creatinine Ratio: 20 (ref 12–28)
BUN: 25 mg/dL (ref 8–27)
Bilirubin Total: 0.4 mg/dL (ref 0.0–1.2)
CO2: 17 mmol/L — ABNORMAL LOW (ref 20–29)
Calcium: 9.6 mg/dL (ref 8.7–10.3)
Chloride: 105 mmol/L (ref 96–106)
Creatinine, Ser: 1.25 mg/dL — ABNORMAL HIGH (ref 0.57–1.00)
Globulin, Total: 2.5 g/dL (ref 1.5–4.5)
Glucose: 225 mg/dL — ABNORMAL HIGH (ref 70–99)
Potassium: 4.6 mmol/L (ref 3.5–5.2)
Sodium: 141 mmol/L (ref 134–144)
Total Protein: 6.6 g/dL (ref 6.0–8.5)
eGFR: 45 mL/min/{1.73_m2} — ABNORMAL LOW (ref >59)

## 2021-12-17 LAB — CBC WITH DIFFERENTIAL/PLATELET
Basophils Absolute: 0.1 10*3/uL (ref 0.0–0.2)
Basos: 1 %
EOS (ABSOLUTE): 1.3 10*3/uL — ABNORMAL HIGH (ref 0.0–0.4)
Eos: 15 %
Hematocrit: 38.6 % (ref 34.0–46.6)
Hemoglobin: 12.7 g/dL (ref 11.1–15.9)
Immature Grans (Abs): 0 10*3/uL (ref 0.0–0.1)
Immature Granulocytes: 0 %
Lymphocytes Absolute: 1.1 10*3/uL (ref 0.7–3.1)
Lymphs: 13 %
MCH: 29.1 pg (ref 26.6–33.0)
MCHC: 32.9 g/dL (ref 31.5–35.7)
MCV: 88 fL (ref 79–97)
Monocytes Absolute: 0.6 10*3/uL (ref 0.1–0.9)
Monocytes: 7 %
Neutrophils Absolute: 5.4 10*3/uL (ref 1.4–7.0)
Neutrophils: 64 %
Platelets: 338 10*3/uL (ref 150–450)
RBC: 4.37 x10E6/uL (ref 3.77–5.28)
RDW: 13.1 % (ref 11.7–15.4)
WBC: 8.6 10*3/uL (ref 3.4–10.8)

## 2021-12-17 LAB — PRO B NATRIURETIC PEPTIDE: NT-Pro BNP: 762 pg/mL — ABNORMAL HIGH (ref 0–738)

## 2021-12-17 LAB — TSH: TSH: 0.803 u[IU]/mL (ref 0.450–4.500)

## 2021-12-17 NOTE — Telephone Encounter (Signed)
Prescription refill request for Xarelto received.  Indication: Afib  Last office visit: 09/23/21 [name])  Weight: 103.9kg Age: 76 Scr: 1.25 (12/16/21)  CrCl: 62.49ml/min  Appropriate dose and refill sent to requested pharmacy

## 2021-12-22 ENCOUNTER — Telehealth: Payer: Self-pay | Admitting: Cardiology

## 2021-12-22 DIAGNOSIS — I4819 Other persistent atrial fibrillation: Secondary | ICD-10-CM

## 2021-12-22 NOTE — Telephone Encounter (Signed)
Patient calling back to say that she still experiencing the same thing she came into office with on last week. Please advise

## 2021-12-23 ENCOUNTER — Ambulatory Visit (INDEPENDENT_AMBULATORY_CARE_PROVIDER_SITE_OTHER): Payer: Medicare Other

## 2021-12-23 DIAGNOSIS — I4819 Other persistent atrial fibrillation: Secondary | ICD-10-CM

## 2021-12-23 NOTE — Telephone Encounter (Signed)
Left message for patient to call back.  ?Labs and heart monitor have been ordered.  ?

## 2021-12-23 NOTE — Telephone Encounter (Signed)
Spoke with the patient who reports that she did increase her lasix to 40 mg daily. She was advised to do this for 3 days but has continued with the increased dose. She does think that it has helped. She continues to have shortness of breath with exertion although it has improved. She is not as fatigued as she was last week when she called. She denies any swelling. She does report some dizziness at times. She does not have a way to check her BP or HR. She does report that her urine has been dark for the past several weeks and has concerns about her kidneys. She does state that she is staying hydrated. Advised her to follow up with her PCP. ?Patient reports that she does not feel like she is in Afib right now. She states that she knows when she is. She reports it causes her to become extremely fatigued. She felt like this when she called last week, however by the time she came in for the EKG she felt better and knew she would not be in Afib. She does not want to be seen by Afib clinic at this time.  ?

## 2021-12-23 NOTE — Progress Notes (Unsigned)
Enrolled for [employer] to mail a ZIO XT long term holter monitor to the patients address on file.  

## 2021-12-24 ENCOUNTER — Other Ambulatory Visit: Payer: Self-pay

## 2021-12-24 DIAGNOSIS — I4819 Other persistent atrial fibrillation: Secondary | ICD-10-CM

## 2021-12-24 NOTE — Telephone Encounter (Signed)
Patient is returning call. States she plans to travel out of town for a funeral around 11:30 AM. She is hopeful for a call back prior to leaving home if possible. ?

## 2021-12-24 NOTE — Telephone Encounter (Signed)
Spoke with patient and informed her of the blood work that was ordered. I explained the Zio monitor to her, as well. She did state that after taking the 40 mg lasix doses, she has less edema in her ankles. She stated she will get lab work tomorrow. ?

## 2021-12-25 ENCOUNTER — Other Ambulatory Visit: Payer: Medicare Other

## 2021-12-28 ENCOUNTER — Other Ambulatory Visit: Payer: Self-pay

## 2021-12-28 ENCOUNTER — Other Ambulatory Visit: Payer: Medicare Other | Admitting: *Deleted

## 2021-12-28 DIAGNOSIS — I420 Dilated cardiomyopathy: Secondary | ICD-10-CM | POA: Diagnosis not present

## 2021-12-28 DIAGNOSIS — I10 Essential (primary) hypertension: Secondary | ICD-10-CM | POA: Diagnosis not present

## 2021-12-28 DIAGNOSIS — G4733 Obstructive sleep apnea (adult) (pediatric): Secondary | ICD-10-CM | POA: Diagnosis not present

## 2021-12-28 DIAGNOSIS — E119 Type 2 diabetes mellitus without complications: Secondary | ICD-10-CM | POA: Diagnosis not present

## 2021-12-28 DIAGNOSIS — I4819 Other persistent atrial fibrillation: Secondary | ICD-10-CM | POA: Diagnosis not present

## 2021-12-29 ENCOUNTER — Telehealth: Payer: Self-pay

## 2021-12-29 LAB — BASIC METABOLIC PANEL
BUN/Creatinine Ratio: 23 (ref 12–28)
BUN: 31 mg/dL — ABNORMAL HIGH (ref 8–27)
CO2: 17 mmol/L — ABNORMAL LOW (ref 20–29)
Calcium: 10.1 mg/dL (ref 8.7–10.3)
Chloride: 104 mmol/L (ref 96–106)
Creatinine, Ser: 1.35 mg/dL — ABNORMAL HIGH (ref 0.57–1.00)
Glucose: 194 mg/dL — ABNORMAL HIGH (ref 70–99)
Potassium: 4.1 mmol/L (ref 3.5–5.2)
Sodium: 139 mmol/L (ref 134–144)
eGFR: 41 mL/min/{1.73_m2} — ABNORMAL LOW (ref >59)

## 2021-12-29 LAB — PRO B NATRIURETIC PEPTIDE: NT-Pro BNP: 1265 pg/mL — ABNORMAL HIGH (ref 0–738)

## 2021-12-29 NOTE — Telephone Encounter (Signed)
-----   Message from Sueanne Margarita, MD sent at 12/29/2021  8:38 AM EST ----- ?BNP remains elevated and actually almost doubled what it was 2 weeks ago.  Serum creatinine slightly bumped from baseline -I would like her to get worked in with one of the extenders this week.  Okay to send to [facility] if needed ?

## 2021-12-29 NOTE — Telephone Encounter (Signed)
The patient has been notified of the result and verbalized understanding.  All questions (if any) were answered. ?Antonieta Iba, RN 12/29/2021 11:40 AM  ?Patient has been scheduled to see Nicholes Rough, PA-C on 3/10.  ?

## 2022-01-01 ENCOUNTER — Ambulatory Visit: Payer: Medicare Other | Admitting: Physician Assistant

## 2022-01-01 ENCOUNTER — Encounter: Payer: Self-pay | Admitting: Physician Assistant

## 2022-01-01 ENCOUNTER — Other Ambulatory Visit: Payer: Self-pay

## 2022-01-01 VITALS — BP 100/60 | HR 50 | Ht 66.0 in | Wt 230.6 lb

## 2022-01-01 DIAGNOSIS — R0602 Shortness of breath: Secondary | ICD-10-CM | POA: Diagnosis not present

## 2022-01-01 DIAGNOSIS — I5032 Chronic diastolic (congestive) heart failure: Secondary | ICD-10-CM

## 2022-01-01 DIAGNOSIS — I4819 Other persistent atrial fibrillation: Secondary | ICD-10-CM | POA: Diagnosis not present

## 2022-01-01 DIAGNOSIS — I2510 Atherosclerotic heart disease of native coronary artery without angina pectoris: Secondary | ICD-10-CM | POA: Diagnosis not present

## 2022-01-01 DIAGNOSIS — I5042 Chronic combined systolic (congestive) and diastolic (congestive) heart failure: Secondary | ICD-10-CM | POA: Diagnosis not present

## 2022-01-01 DIAGNOSIS — R0609 Other forms of dyspnea: Secondary | ICD-10-CM

## 2022-01-01 DIAGNOSIS — I428 Other cardiomyopathies: Secondary | ICD-10-CM

## 2022-01-01 DIAGNOSIS — Z79899 Other long term (current) drug therapy: Secondary | ICD-10-CM

## 2022-01-01 MED ORDER — FUROSEMIDE 20 MG PO TABS: ORAL_TABLET | ORAL | 0 refills | Status: DC

## 2022-01-01 NOTE — Patient Instructions (Signed)
Medication Instructions:  ?Your physician has recommended you make the following change in your medication:  ?1.Increase furosemide (Lasix) to 20 mg twice a day for one week then resume 20 mg daily ?*If you need a refill on your cardiac medications before your next appointment, please call your pharmacy* ? ? ?Lab Work: ?BMP and BNP in one week ?If you have labs (blood work) drawn today and your tests are completely normal, you will receive your results only by: ?MyChart Message (if you have MyChart) OR ?A paper copy in the mail ?If you have any lab test that is abnormal or we need to change your treatment, we will call you to review the results. ? ? ?Testing/Procedures: ?Your physician has requested that you have an echocardiogram in 3 weeks. Echocardiography is a painless test that uses sound waves to create images of your heart. It provides your doctor with information about the size and shape of your heart and how well your heart?s chambers and valves are working. This procedure takes approximately one hour. There are no restrictions for this procedure.  ? ? ?Follow-Up: ?At [facility], you and your health needs are our priority.  As part of our continuing mission to provide you with exceptional heart care, we have created designated Provider Care Teams.  These Care Teams include your primary Cardiologist (physician) and Advanced Practice Providers (APPs -  Physician Assistants and Nurse Practitioners) who all work together to provide you with the care you need, when you need it. ? ? ?Your next appointment:   ?4-6 week(s) ? ?The format for your next appointment:   ?In Person ? ?Provider:   ?[name], MD or APP ? ?Other Instructions ?1.Follow up with your primary care provider regarding your upper airway congestion.  ?

## 2022-01-01 NOTE — Progress Notes (Signed)
Office Visit    Patient Name: Cynthia Herrera Date of Encounter: 01/01/2022  PCP:  Isaac Bliss, Rayford Halsted, MD   [place]  Cardiologist:  [name], MD  Advanced Practice Provider:  No care team member to display Electrophysiologist:  None    Chief Complaint    Cynthia Herrera is a 76 y.o. 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  She was seen in 2020 by the heart failure team and her EF improved with maintenance of normal sinus rhythm.  Low-dose beta-blocker secondary to history of bradycardia.  She was seen by Dr. Rayann Heman April 2022 with complaint of some postural dizziness.  No symptoms of atrial fibrillation post ablation and off AAD.  Lasix was reduced secondary to orthostatic dizziness.  She was last seen November 2022 and was doing well at that time.  She had no unusual fatigue which is her atrial fibrillation symptoms.  She denied chest pain, palpitations, shortness of breath, syncope/near syncope.  She called a few days ago and her BNP remained elevated with her serum creatinine slightly bumped from baseline.  She also endorsed some shortness of breath.  She was scheduled for a work-in appointment.  Today, she states that she has been experiencing shortness of breath with activity.  It has recently increased.  She states her weight has been about the same.  She also states she has not noticed much swelling in her ankles.  She said that she had a right lower back pain the other day and her urine has been "dark and warm".  She is worried about her kidney function.  She had a URI about a month ago and is still getting over this.  She still has some congestion and is coughing  up yellow phlegm.  This could certainly be contributing to her shortness of breath.  She does not appear to be particularly fluid overloaded today however, her proBNP is greater than 1200.  We helped her get her ZIO monitor placed today.  We discussed treatment options for shortness of breath.   Reports no chest pain, pressure, or tightness. No edema, orthopnea, PND. Reports no palpitations.      Past Medical History    Past Medical History:  Diagnosis Date   Arthritis    Breast cancer, left breast ([place]) 2002   "then treated w/chemo and radiation"   Breast cancer, left breast ([place]) 07/14/2017   "tx'd w/mastectomy"   Chronic back pain    h/o lumbar stenosis; "no problem since my OR" (`10/27/2017)   Chronic systolic CHF (congestive heart failure) (HCC)    Complication of anesthesia    last surgery iv med when going to sleep"burned as injected"   COPD (chronic obstructive pulmonary disease) ([place])    no inhalers    DCM (dilated cardiomyopathy) ([place])    EF 15-20% ? tachycardia induced - EF 35-40% by echo 05/2018   Dyspnea    with exertion   Gallstones    GERD (gastroesophageal reflux disease)    once in a while;depends on what she eats   History of bronchitis    "chronic when I smoked; no problem since I quit in 2002" (10/27/2017)   Hyperlipidemia    Hypertension    takes Losartan and Metoprolol.    Joint pain  Muscle spasm    takes Flexeril daily as needed    Nocturia    OSA (obstructive sleep apnea) 08/22/2018   Severe obstructive sleep apnea with an AHI 47.3/h and no significant central sleep apnea with moderate oxygen desaturations as low as 79%   Osteopenia    Persistent atrial fibrillation (HCC)    s/p TEE DCCV and repeat DCCV 11/14/2013 and 02/2018   Personal history of colonic polyps    adenomas 03 and 08   Pneumonia    hx   Seasonal allergies    Thyroid nodule    Type II diabetes mellitus ([place])    Vitamin D deficiency    takes Vit D   Past Surgical History:   Procedure Laterality Date   ATRIAL FIBRILLATION ABLATION N/A 12/12/2018   Procedure: ATRIAL FIBRILLATION ABLATION;  Surgeon: Thompson Grayer, MD;  Location: [facility];  Service: Cardiovascular;  Laterality: N/A;   AXILLARY LYMPH NODE DISSECTION Left 10/2001   persistent intramammary node /notes 03/09/2011   BACK SURGERY     BREAST BIOPSY Left 2002   BREAST BIOPSY Right 06/2017   "node bx was negative"   BREAST BIOPSY Left 06/2017   "positive for cancer"   BREAST LUMPECTOMY WITH RADIOACTIVE SEED LOCALIZATION Right 08/15/2017   Procedure: RIGHT BREAST LUMPECTOMY WITH RADIOACTIVE SEED LOCALIZATION;  Surgeon: Jovita Kussmaul, MD;  Location: [place];  Service: General;  Laterality: Right;   CARDIAC CATHETERIZATION  2015   CARDIOVERSION N/A 11/12/2013   Procedure: CARDIOVERSION;  Surgeon: Thayer Headings, MD;  Location: [place];  Service: Cardiovascular;  Laterality: N/A;  10:08  Dr. Marissa Nestle, anesthesia present, Lido   '60mg'$ ,  propofol '50mg'$ , IV for elective cardioversion....Dr. Cathie Olden delievered synch 120 joules with successful cardioversion to NSR   CARDIOVERSION N/A 11/12/2013   Procedure: CARDIOVERSION;  Surgeon: Thayer Headings, MD;  Location: [place];  Service: Cardiovascular;  Laterality: N/A;   CARDIOVERSION N/A 11/14/2013   Procedure: CARDIOVERSION (BEDSIDE);  Surgeon: Larey Dresser, MD;  Location: [place];  Service: Cardiovascular;  Laterality: N/A;   CARDIOVERSION N/A 02/22/2018   Procedure: CARDIOVERSION;  Surgeon: Sueanne Margarita, MD;  Location: [facility];  Service: Cardiovascular;  Laterality: N/A;   CATARACT EXTRACTION W/ INTRAOCULAR LENS  IMPLANT, BILATERAL Bilateral ~ 2016   COLONOSCOPY  2003, September 2008, April 01, 2011   adenomas 03 and 08, polyp 12, diverticulosis   COLONOSCOPY WITH PROPOFOL N/A 11/16/2016   Procedure: COLONOSCOPY WITH PROPOFOL;  Surgeon: Gatha Mayer, MD;  Location: [facility];  Service: Endoscopy;  Laterality: N/A;   JOINT REPLACEMENT      LAPAROSCOPIC CHOLECYSTECTOMY  2004   LEFT AND RIGHT HEART CATHETERIZATION WITH CORONARY ANGIOGRAM N/A 02/01/2014   Procedure: LEFT AND RIGHT HEART CATHETERIZATION WITH CORONARY ANGIOGRAM;  Surgeon: Sueanne Margarita, MD;  Location: [place];  Service: Cardiovascular;  Laterality: N/A;   LUMBAR LAMINECTOMY/DECOMPRESSION MICRODISCECTOMY N/A 03/19/2014   Procedure: LUMBAR LAMINECTOMY/DECOMPRESSION MICRODISCECTOMY 4 LEVEL;  Surgeon: Kristeen Miss, MD;  Location: [place];  Service: Neurosurgery;  Laterality: N/A;  L1-2 L2-3 L3-4 L4-5 Laminectomy/Foraminotomy   MASS EXCISION Left 08/15/2017   Procedure: EXCISION OF LEFT BREAST MASS;  Surgeon: Jovita Kussmaul, MD;  Location: [place];  Service: General;  Laterality: Left;   MASTECTOMY Left 10/27/2017   MASTECTOMY PARTIAL / LUMPECTOMY W/ AXILLARY LYMPHADENECTOMY  05/08/2001   Archie Endo 03/09/2011   PORT-A-CATH REMOVAL  2003   PORTA CATH INSERTION  2002   TEE WITHOUT CARDIOVERSION N/A 11/12/2013  Procedure: TRANSESOPHAGEAL ECHOCARDIOGRAM (TEE);  Surgeon: Thayer Headings, MD;  Location: [place];  Service: Cardiovascular;  Laterality: N/A;  pt b/p low, pt buccal membranes very dry, lips scapped, pt c/o thirst. NPO since MN and iv FLUIDS TOTAL INFUSING AT TOTAL 20ML HR....  Dr. Cathie Olden order allow NS to bolus during procedure....very dry,NS bolus 250 ml total..pt responding well to meds.Marland Kitchen   TOTAL KNEE ARTHROPLASTY Left 2006   TOTAL KNEE ARTHROPLASTY Right 05/10/2016   Procedure: RIGHT TOTAL KNEE ARTHROPLASTY;  Surgeon: Gaynelle Arabian, MD;  Location: [facility];  Service: Orthopedics;  Laterality: Right;  With adductor block   TOTAL MASTECTOMY Left 10/27/2017   Procedure: LEFT MASTECTOMY;  Surgeon: Jovita Kussmaul, MD;  Location: [facility];  Service: General;  Laterality: Left;   TUBAL LIGATION      Allergies  Allergies  Allergen Reactions   Tikosyn [Dofetilide]     Prolonged QT/QTc   Zolpidem Other (See Comments)    Hallucinations, up walking around ?  Hallucinations, up walking around   Codeine Other (See Comments)   Codeine Phosphate Nausea And Vomiting   Methylprednisolone Other (See Comments)    Felt really weird w the high dose oral steroid, but tolerates low doses or oral steroid Felt really weird w the high dose oral steroid, but tolerates low doses or oral steroid     EKGs/Labs/Other Studies Reviewed:   The following studies were reviewed today:  Echocardiogram 07/2020  IMPRESSIONS     1. Left ventricular ejection fraction, by estimation, is 45 to 50%. Left  ventricular ejection fraction by 3D volume is 45 %. The left ventricle has  mildly decreased function. The left ventricle has no regional wall motion  abnormalities. Left ventricular   diastolic parameters are consistent with Grade I diastolic dysfunction  (impaired relaxation). Elevated left atrial pressure.   2. Right ventricular systolic function is normal. The right ventricular  size is normal. Tricuspid regurgitation signal is inadequate for assessing  PA pressure.   3. A small pericardial effusion is present. The pericardial effusion is  circumferential. There is no evidence of cardiac tamponade.   4. The mitral valve is grossly normal. Trivial mitral valve  regurgitation. No evidence of mitral stenosis.   5. The aortic valve is tricuspid. Aortic valve regurgitation is not  visualized. No aortic stenosis is present.   6. The inferior vena cava is normal in size with greater than 50%  respiratory variability, suggesting right atrial pressure of 3 mmHg.   Comparison(s): No significant change from prior study.   EKG:  EKG is not ordered today.   Recent Labs: 12/16/2021: ALT 13; Hemoglobin 12.7; Platelets 338; TSH 0.803 12/28/2021: BUN 31; Creatinine, Ser 1.35; NT-Pro BNP 1,265; Potassium 4.1; Sodium 139  Recent Lipid Panel    Component Value Date/Time   CHOL 136 03/26/2021 1036   TRIG 161 (H) 03/26/2021 1036   HDL 36 (L) 03/26/2021 1036   CHOLHDL 3.8  03/26/2021 1036   CHOLHDL 3 02/03/2017 1207   VLDL 23.0 02/03/2017 1207   LDLCALC 72 03/26/2021 1036    Risk Assessment/Calculations:   CHA2DS2-VASc Score = 7   This indicates a 11.2% annual risk of stroke. The patient's score is based upon: CHF History: 1 HTN History: 1 Diabetes History: 1 Stroke History: 0 Vascular Disease History: 1 Age Score: 2 Gender Score: 1     Home Medications   Current Meds  Medication Sig   atorvastatin (LIPITOR) 40 MG tablet TAKE 1 TABLET BY MOUTH EVERYDAY  AT BEDTIME   Cholecalciferol (VITAMIN D3) 2000 units capsule Take 2,000 Units by mouth daily.   clotrimazole-betamethasone (LOTRISONE) cream Apply 1 application topically 2 (two) times daily.   colchicine 0.6 MG tablet TAKE 1 TABLET BY MOUTH 2 TIMES DAILY FOR 21 DAYS.   empagliflozin (JARDIANCE) 10 MG TABS tablet Take 1 tablet (10 mg total) by mouth daily before breakfast.   fexofenadine (ALLEGRA) 180 MG tablet Take 180 mg by mouth daily as needed for allergies or rhinitis.   fluconazole (DIFLUCAN) 150 MG tablet TAKE 1 TABLET BY MOUTH ONE TIME PER WEEK   fluticasone (FLONASE) 50 MCG/ACT nasal spray Place 1 spray into both nostrils daily as needed for allergies.    gabapentin (NEURONTIN) 300 MG capsule TAKE 2 CAPSULES BY MOUTH AT BEDTIME   glucose blood test strip Use to check CBG AC and QHS.   KLOR-CON M20 20 MEQ tablet TAKE 1 TABLET BY MOUTH EVERY DAY   losartan (COZAAR) 25 MG tablet Take 1 tablet (25 mg total) by mouth 2 (two) times daily.   metFORMIN (GLUCOPHAGE) 500 MG tablet Take 500 mg by mouth daily.   nebivolol (BYSTOLIC) 2.5 MG tablet Take 1 tablet (2.5 mg total) by mouth daily.   OneTouch Delica Lancets 70J MISC 1 each by Does not apply route daily.   pantoprazole (PROTONIX) 40 MG tablet Take 1 tablet (40 mg total) by mouth daily.   spironolactone (ALDACTONE) 25 MG tablet TAKE 1 TABLET (25 MG TOTAL) BY MOUTH DAILY.   traMADol (ULTRAM) 50 MG tablet Take 50 mg by mouth as needed for  moderate pain.   XARELTO 20 MG TABS tablet TAKE 1 TABLET BY MOUTH EVERY DAY WITH SUPPER   [DISCONTINUED] furosemide (LASIX) 20 MG tablet Take 20 mg by mouth as needed.     Review of Systems      All other systems reviewed and are otherwise negative except as noted above.  Physical Exam    VS:  BP 100/60    Pulse (!) 50    Ht '5\' 6"'$  (1.676 m)    Wt 230 lb 9.6 oz (104.6 kg)    SpO2 95%    BMI 37.22 kg/m  , BMI Body mass index is 37.22 kg/m.  Wt Readings from Last 3 Encounters:  01/01/22 230 lb 9.6 oz (104.6 kg)  11/11/21 229 lb (103.9 kg)  10/23/21 237 lb 1 oz (107.5 kg)     GEN: Well nourished, well developed, in no acute distress. HEENT: normal. Cardiac: sinus bradycardia, no murmurs, rubs, or gallops. No clubbing, cyanosis, edema.  Radials/PT 2+ and equal bilaterally.  Respiratory:  Respirations regular and unlabored, clear to auscultation bilaterally. GI: Soft, nontender, nondistended. MS: No deformity or atrophy. Skin: Warm and dry, no rash. Neuro:  Strength and sensation are intact. Psych: Normal affect.  Assessment & Plan    Persistent atrial fibrillation -Her main symptom with afib is extreme fatigue and SOB -sinus bradycardia today, rate 50 bpm -Zio monitor ordered and being placed today, instructions reviewed -On Xarelto for anticoagulation, CHA2DS2-VASc score of 7  Hypertension -Low normal today, no symptoms -Continue to monitor your BP at home  Hyperlipidemia -Will need updated lipid panel on next visit  Chronic systolic CHF/NICM -SOB is her main concern today, may be multifactorial since she does not appear particularly fluid overloaded on exam today -Refer back to PCP, recent illness may be contributing with upper respiratory congestion -BNP >1200 almost double what it was a month ago -Increase lasix to '20mg'$  BID,  continue spirolactone -daily weights (weight has been stable per patient) -repeat BMP and BNP in 1 week -Echocardiogram after monitor is sent  in 2 weeks   Disposition: Follow up 1 month with [name], MD or APP.  Signed, Elgie Collard, PA-C 01/01/2022, 4:23 PM [facility]

## 2022-01-08 ENCOUNTER — Other Ambulatory Visit: Payer: Self-pay

## 2022-01-08 ENCOUNTER — Other Ambulatory Visit: Payer: Medicare Other | Admitting: *Deleted

## 2022-01-08 DIAGNOSIS — I5042 Chronic combined systolic (congestive) and diastolic (congestive) heart failure: Secondary | ICD-10-CM | POA: Diagnosis not present

## 2022-01-08 DIAGNOSIS — Z79899 Other long term (current) drug therapy: Secondary | ICD-10-CM | POA: Diagnosis not present

## 2022-01-08 DIAGNOSIS — R0609 Other forms of dyspnea: Secondary | ICD-10-CM

## 2022-01-08 DIAGNOSIS — R0602 Shortness of breath: Secondary | ICD-10-CM | POA: Diagnosis not present

## 2022-01-08 DIAGNOSIS — I428 Other cardiomyopathies: Secondary | ICD-10-CM | POA: Diagnosis not present

## 2022-01-08 DIAGNOSIS — I5032 Chronic diastolic (congestive) heart failure: Secondary | ICD-10-CM

## 2022-01-09 LAB — BASIC METABOLIC PANEL
BUN/Creatinine Ratio: 23 (ref 12–28)
BUN: 34 mg/dL — ABNORMAL HIGH (ref 8–27)
CO2: 21 mmol/L (ref 20–29)
Calcium: 9.7 mg/dL (ref 8.7–10.3)
Chloride: 102 mmol/L (ref 96–106)
Creatinine, Ser: 1.49 mg/dL — ABNORMAL HIGH (ref 0.57–1.00)
Glucose: 267 mg/dL — ABNORMAL HIGH (ref 70–99)
Potassium: 4.3 mmol/L (ref 3.5–5.2)
Sodium: 140 mmol/L (ref 134–144)
eGFR: 36 mL/min/{1.73_m2} — ABNORMAL LOW (ref >59)

## 2022-01-09 LAB — PRO B NATRIURETIC PEPTIDE: NT-Pro BNP: 481 pg/mL (ref 0–738)

## 2022-01-11 ENCOUNTER — Other Ambulatory Visit: Payer: Self-pay | Admitting: *Deleted

## 2022-01-11 ENCOUNTER — Encounter: Payer: Self-pay | Admitting: Internal Medicine

## 2022-01-11 DIAGNOSIS — I5042 Chronic combined systolic (congestive) and diastolic (congestive) heart failure: Secondary | ICD-10-CM

## 2022-01-11 DIAGNOSIS — Z79899 Other long term (current) drug therapy: Secondary | ICD-10-CM

## 2022-01-11 NOTE — Progress Notes (Signed)
Kidnes function decreased compared to previous likely due to increased diuresis. Ensure restricting to <2L fluid daily and following low salt diet. Hold Lasix for 2 days then resume at '20mg'$  daily. Continue Spironolactone at current dose. Repeat BMP in 1 week.  ?Written by Loel Dubonnet, NP on 01/11/2022  8:56 AM EDT ?

## 2022-01-15 ENCOUNTER — Ambulatory Visit: Payer: Medicare Other | Admitting: Cardiology

## 2022-01-21 ENCOUNTER — Ambulatory Visit (HOSPITAL_COMMUNITY): Payer: Medicare Other | Attending: Cardiology

## 2022-01-21 ENCOUNTER — Other Ambulatory Visit: Payer: Medicare Other

## 2022-01-21 DIAGNOSIS — R0609 Other forms of dyspnea: Secondary | ICD-10-CM | POA: Diagnosis not present

## 2022-01-21 DIAGNOSIS — I4819 Other persistent atrial fibrillation: Secondary | ICD-10-CM | POA: Diagnosis not present

## 2022-01-21 DIAGNOSIS — R0602 Shortness of breath: Secondary | ICD-10-CM | POA: Insufficient documentation

## 2022-01-21 DIAGNOSIS — Z79899 Other long term (current) drug therapy: Secondary | ICD-10-CM | POA: Diagnosis not present

## 2022-01-21 DIAGNOSIS — I5042 Chronic combined systolic (congestive) and diastolic (congestive) heart failure: Secondary | ICD-10-CM | POA: Diagnosis not present

## 2022-01-22 ENCOUNTER — Telehealth: Payer: Self-pay | Admitting: Cardiology

## 2022-01-22 ENCOUNTER — Telehealth: Payer: Self-pay

## 2022-01-22 DIAGNOSIS — I493 Ventricular premature depolarization: Secondary | ICD-10-CM

## 2022-01-22 LAB — BASIC METABOLIC PANEL
BUN/Creatinine Ratio: 16 (ref 12–28)
BUN: 21 mg/dL (ref 8–27)
CO2: 16 mmol/L — ABNORMAL LOW (ref 20–29)
Calcium: 9.9 mg/dL (ref 8.7–10.3)
Chloride: 104 mmol/L (ref 96–106)
Creatinine, Ser: 1.3 mg/dL — ABNORMAL HIGH (ref 0.57–1.00)
Glucose: 226 mg/dL — ABNORMAL HIGH (ref 70–99)
Potassium: 4.3 mmol/L (ref 3.5–5.2)
Sodium: 140 mmol/L (ref 134–144)
eGFR: 43 mL/min/{1.73_m2} — ABNORMAL LOW (ref >59)

## 2022-01-22 LAB — ECHOCARDIOGRAM COMPLETE
Area-P 1/2: 2.82 cm2
S' Lateral: 4.3 cm

## 2022-01-22 NOTE — Telephone Encounter (Signed)
The patient has been notified of the result and verbalized understanding.  All questions (if any) were answered. ?Antonieta Iba, RN 01/22/2022 12:33 PM  ?Referral has been placed.  ?

## 2022-01-22 NOTE — Telephone Encounter (Signed)
Patient is returning call to discuss monitor results. 

## 2022-01-22 NOTE — Telephone Encounter (Signed)
The patient has been notified of the result and verbalized understanding.  All questions (if any) were answered. ?Antonieta Iba, RN 01/22/2022 12:46 PM  ?Patient reports that after a week of doubling her Lasix to '20mg'$  twice per day she went back to 20 mg daily. Yesterday she was having some swelling in her legs so she took an extra 20 mg of Lasix. She states that swelling has improved today and her weight is down 2 lbs. She would like to know how often she can take an extra dose of Lasix if she has swelling.  ?

## 2022-01-22 NOTE — Telephone Encounter (Signed)
-----   Message from Sueanne Margarita, MD sent at 01/22/2022 10:18 AM EDT ----- ?Heart monitor showed frequent episodes of nonsustained atrial tachycardia lasting up to 11.5 seconds at a time with fastest heart rate 164 bpm.  She also has multiple PVCs with a PVC load 24%.  I would like her referred to EP. ?

## 2022-01-22 NOTE — Telephone Encounter (Signed)
-----   Message from Loel Dubonnet, NP sent at 01/22/2022 12:36 PM EDT ----- ?Kidney function stable compared to previous.  Normal electrolytes.  Continue current medications. ?

## 2022-01-25 NOTE — Telephone Encounter (Signed)
Pt called back returning Antonieta Iba call  ? ? ?Best number [phone] ?

## 2022-01-25 NOTE — Telephone Encounter (Signed)
Spoke with the patient and advised her on recommendations from Dr. Radford Pax in regards to her Lasix. She states that she has been having to take an extra 20 mg almost every other day to keep her swelling down. She otherwise is feeling good.  ?I have also reviewed her echocardiogram results. Patient verbalized understanding.  ? ?Loel Dubonnet, NP  ?01/22/2022  8:35 PM EDT   ?  ?Echocardiogram shows mildly reduced heart pumping function compared to previous. No significant valvular abnormalities. Follow up as scheduled with Christen Bame, NP for optimization of medical therapy.  ?  ?[employer] as FYI.   ? ?

## 2022-01-25 NOTE — Telephone Encounter (Signed)
Left message for patient to call back  

## 2022-01-28 ENCOUNTER — Encounter: Payer: Self-pay | Admitting: Internal Medicine

## 2022-01-28 ENCOUNTER — Ambulatory Visit: Payer: Medicare Other | Admitting: Internal Medicine

## 2022-01-28 VITALS — BP 110/74 | HR 66 | Ht 66.0 in | Wt 227.4 lb

## 2022-01-28 DIAGNOSIS — I493 Ventricular premature depolarization: Secondary | ICD-10-CM | POA: Diagnosis not present

## 2022-01-28 DIAGNOSIS — I4819 Other persistent atrial fibrillation: Secondary | ICD-10-CM | POA: Diagnosis not present

## 2022-01-28 DIAGNOSIS — G4733 Obstructive sleep apnea (adult) (pediatric): Secondary | ICD-10-CM | POA: Diagnosis not present

## 2022-01-28 DIAGNOSIS — I10 Essential (primary) hypertension: Secondary | ICD-10-CM | POA: Diagnosis not present

## 2022-01-28 NOTE — Patient Instructions (Signed)
Medication Instructions:  ?Your physician recommends that you continue on your current medications as directed. Please refer to the Current Medication list given to you today. ?*If you need a refill on your cardiac medications before your next appointment, please call your pharmacy* ? ?Lab Work: ?None. ?If you have labs (blood work) drawn today and your tests are completely normal, you will receive your results only by: ?MyChart Message (if you have MyChart) OR ?A paper copy in the mail ?If you have any lab test that is abnormal or we need to change your treatment, we will call you to review the results. ? ?Testing/Procedures: ?None. ? ?Follow-Up: ?At [facility], you and your health needs are our priority.  As part of our continuing mission to provide you with exceptional heart care, we have created designated Provider Care Teams.  These Care Teams include your primary Cardiologist (physician) and Advanced Practice Providers (APPs -  Physician Assistants and Nurse Practitioners) who all work together to provide you with the care you need, when you need it. ? ?Your physician wants you to follow-up in: 6 months with  one of the following Advanced Practice Providers on your designated Care Team:   ? ? ?Legrand Como "Jonni Sanger" [place], PA-C ?  You will receive a reminder letter in the mail two months in advance. If you don't receive a letter, please call our office to schedule the follow-up appointment. ? ?We recommend signing up for the patient portal called "MyChart".  Sign up information is provided on this After Visit Summary.  MyChart is used to connect with patients for Virtual Visits (Telemedicine).  Patients are able to view lab/test results, encounter notes, upcoming appointments, etc.  Non-urgent messages can be sent to your provider as well.   ?To learn more about what you can do with MyChart, go to [email or web address].   ? ?Any Other Special Instructions Will Be Listed Below (If Applicable). ? ? ? ? ?  ? ? ?

## 2022-01-28 NOTE — Progress Notes (Signed)
? ?PCP: Isaac Bliss, Rayford Halsted, MD ?Primary Cardiologist: Dr Radford Pax ?Primary EP: Dr Rayann Heman ? ?Cynthia Herrera is a 76 y.o. female who presents today for routine electrophysiology followup.  Since last being seen in our clinic, the patient reports doing reasonably well.  She has chronic SOB and fatigue.  She is not very active.  + edema.  She has PVCs for which she is rarely symptomatic.  AF is controlled.  Today, she denies symptoms of chest pain,  dizziness, presyncope, or syncope.  The patient is otherwise without complaint today.  ? ?Past Medical History:  ?Diagnosis Date  ? Arthritis   ? Breast cancer, left breast [facility]) 2002  ? "then treated w/chemo and radiation"  ? Breast cancer, left breast ([place]) 07/14/2017  ? "tx'd w/mastectomy"  ? Chronic back pain   ? h/o lumbar stenosis; "no problem since my OR" (`10/27/2017)  ? Chronic systolic CHF (congestive heart failure) ([place])   ? Complication of anesthesia   ? last surgery iv med when going to sleep"burned as injected"  ? COPD (chronic obstructive pulmonary disease) ([place])   ? no inhalers   ? DCM (dilated cardiomyopathy) ([place])   ? EF 15-20% ? tachycardia induced - EF 35-40% by echo 05/2018  ? Dyspnea   ? with exertion  ? Gallstones   ? GERD (gastroesophageal reflux disease)   ? once in a while;depends on what she eats  ? History of bronchitis   ? "chronic when I smoked; no problem since I quit in 2002" (10/27/2017)  ? Hyperlipidemia   ? Hypertension   ? takes Losartan and Metoprolol.   ? Joint pain   ? Muscle spasm   ? takes Flexeril daily as needed   ? Nocturia   ? OSA (obstructive sleep apnea) 08/22/2018  ? Severe obstructive sleep apnea with an AHI 47.3/h and no significant central sleep apnea with moderate oxygen desaturations as low as 79%  ? Osteopenia   ? Persistent atrial fibrillation ([place])   ? s/p TEE DCCV and repeat DCCV 11/14/2013 and 02/2018  ? Personal history of colonic polyps   ? adenomas 03 and 08  ? Pneumonia   ? hx  ? Seasonal allergies   ? Thyroid  nodule   ? Type II diabetes mellitus ([place])   ? Vitamin D deficiency   ? takes Vit D  ? ?Past Surgical History:  ?Procedure Laterality Date  ? ATRIAL FIBRILLATION ABLATION N/A 12/12/2018  ? Procedure: ATRIAL FIBRILLATION ABLATION;  Surgeon: Thompson Grayer, MD;  Location: [place] CV LAB;  Service: Cardiovascular;  Laterality: N/A;  ? AXILLARY LYMPH NODE DISSECTION Left 10/2001  ? persistent intramammary node Archie Endo 03/09/2011  ? BACK SURGERY    ? BREAST BIOPSY Left 2002  ? BREAST BIOPSY Right 06/2017  ? "node bx was negative"  ? BREAST BIOPSY Left 06/2017  ? "positive for cancer"  ? BREAST LUMPECTOMY WITH RADIOACTIVE SEED LOCALIZATION Right 08/15/2017  ? Procedure: RIGHT BREAST LUMPECTOMY WITH RADIOACTIVE SEED LOCALIZATION;  Surgeon: Jovita Kussmaul, MD;  Location: [place];  Service: General;  Laterality: Right;  ? CARDIAC CATHETERIZATION  2015  ? CARDIOVERSION N/A 11/12/2013  ? Procedure: CARDIOVERSION;  Surgeon: Thayer Headings, MD;  Location: [facility] ENDOSCOPY;  Service: Cardiovascular;  Laterality: N/A;  10:08  Dr. Marissa Nestle, anesthesia present, Lido   '60mg'$ ,  propofol '50mg'$ , IV for elective cardioversion....Dr. Cathie Olden delievered synch 120 joules with successful cardioversion to NSR  ? CARDIOVERSION N/A 11/12/2013  ? Procedure: CARDIOVERSION;  Surgeon: Arnette Norris  Deboraha Sprang, MD;  Location: [place];  Service: Cardiovascular;  Laterality: N/A;  ? CARDIOVERSION N/A 11/14/2013  ? Procedure: CARDIOVERSION (BEDSIDE);  Surgeon: Larey Dresser, MD;  Location: [place];  Service: Cardiovascular;  Laterality: N/A;  ? CARDIOVERSION N/A 02/22/2018  ? Procedure: CARDIOVERSION;  Surgeon: Sueanne Margarita, MD;  Location: [facility] ENDOSCOPY;  Service: Cardiovascular;  Laterality: N/A;  ? CATARACT EXTRACTION W/ INTRAOCULAR LENS  IMPLANT, BILATERAL Bilateral ~ 2016  ? COLONOSCOPY  2003, September 2008, April 01, 2011  ? adenomas 03 and 08, polyp 12, diverticulosis  ? COLONOSCOPY WITH PROPOFOL N/A 11/16/2016  ? Procedure: COLONOSCOPY WITH PROPOFOL;  Surgeon:  Gatha Mayer, MD;  Location: [facility] ENDOSCOPY;  Service: Endoscopy;  Laterality: N/A;  ? JOINT REPLACEMENT    ? LAPAROSCOPIC CHOLECYSTECTOMY  2004  ? LEFT AND RIGHT HEART CATHETERIZATION WITH CORONARY ANGIOGRAM N/A 02/01/2014  ? Procedure: LEFT AND RIGHT HEART CATHETERIZATION WITH CORONARY ANGIOGRAM;  Surgeon: Sueanne Margarita, MD;  Location: [place] CATH LAB;  Service: Cardiovascular;  Laterality: N/A;  ? LUMBAR LAMINECTOMY/DECOMPRESSION MICRODISCECTOMY N/A 03/19/2014  ? Procedure: LUMBAR LAMINECTOMY/DECOMPRESSION MICRODISCECTOMY 4 LEVEL;  Surgeon: Kristeen Miss, MD;  Location: [place] NEURO ORS;  Service: Neurosurgery;  Laterality: N/A;  L1-2 L2-3 L3-4 L4-5 Laminectomy/Foraminotomy  ? MASS EXCISION Left 08/15/2017  ? Procedure: EXCISION OF LEFT BREAST MASS;  Surgeon: Jovita Kussmaul, MD;  Location: [place];  Service: General;  Laterality: Left;  ? MASTECTOMY Left 10/27/2017  ? MASTECTOMY PARTIAL / LUMPECTOMY W/ AXILLARY LYMPHADENECTOMY  05/08/2001  ? Archie Endo 03/09/2011  ? [place] REMOVAL  2003  ? PORTA CATH INSERTION  2002  ? TEE WITHOUT CARDIOVERSION N/A 11/12/2013  ? Procedure: TRANSESOPHAGEAL ECHOCARDIOGRAM (TEE);  Surgeon: Thayer Headings, MD;  Location: [place];  Service: Cardiovascular;  Laterality: N/A;  pt b/p low, pt buccal membranes very dry, lips scapped, pt c/o thirst. NPO since MN and iv FLUIDS TOTAL INFUSING AT TOTAL 20ML HR....  ?Dr. Cathie Olden order allow NS to bolus during procedure....very dry,NS bolus 250 ml total..pt responding well to meds..  ? TOTAL KNEE ARTHROPLASTY Left 2006  ? TOTAL KNEE ARTHROPLASTY Right 05/10/2016  ? Procedure: RIGHT TOTAL KNEE ARTHROPLASTY;  Surgeon: Gaynelle Arabian, MD;  Location: [facility] ORS;  Service: Orthopedics;  Laterality: Right;  With adductor block  ? TOTAL MASTECTOMY Left 10/27/2017  ? Procedure: LEFT MASTECTOMY;  Surgeon: Jovita Kussmaul, MD;  Location: [place];  Service: General;  Laterality: Left;  ? TUBAL LIGATION    ? ? ?ROS- all systems are reviewed and negatives except as per HPI  above ? ?Current Outpatient Medications  ?Medication Sig Dispense Refill  ? atorvastatin (LIPITOR) 40 MG tablet TAKE 1 TABLET BY MOUTH EVERYDAY AT BEDTIME 90 tablet 1  ? Cholecalciferol (VITAMIN D3) 2000 units capsule Take 2,000 Units by mouth daily.    ? clotrimazole-betamethasone (LOTRISONE) cream Apply 1 application topically 2 (two) times daily. 30 g 0  ? colchicine 0.6 MG tablet TAKE 1 TABLET BY MOUTH 2 TIMES DAILY FOR 21 DAYS. 42 tablet 0  ? empagliflozin (JARDIANCE) 10 MG TABS tablet Take 1 tablet (10 mg total) by mouth daily before breakfast. 90 tablet 1  ? fexofenadine (ALLEGRA) 180 MG tablet Take 180 mg by mouth daily as needed for allergies or rhinitis.    ? fluconazole (DIFLUCAN) 150 MG tablet TAKE 1 TABLET BY MOUTH ONE TIME PER WEEK 6 tablet 2  ? fluticasone (FLONASE) 50 MCG/ACT nasal spray Place 1 spray into both nostrils daily as needed for  allergies.     ? furosemide (LASIX) 20 MG tablet Take one tablet by mouth twice a day for one week, then resume one tablet by mouth daily 35 tablet 0  ? gabapentin (NEURONTIN) 300 MG capsule TAKE 2 CAPSULES BY MOUTH AT BEDTIME 180 capsule 1  ? glucose blood test strip Use to check CBG AC and QHS. 200 each 12  ? KLOR-CON M20 20 MEQ tablet TAKE 1 TABLET BY MOUTH EVERY DAY 90 tablet 1  ? losartan (COZAAR) 25 MG tablet Take 1 tablet (25 mg total) by mouth 2 (two) times daily. 180 tablet 2  ? metFORMIN (GLUCOPHAGE) 500 MG tablet Take 500 mg by mouth daily.    ? nebivolol (BYSTOLIC) 2.5 MG tablet Take 1 tablet (2.5 mg total) by mouth daily. 90 tablet 2  ? OneTouch Delica Lancets 50T MISC 1 each by Does not apply route daily. 100 each 1  ? pantoprazole (PROTONIX) 40 MG tablet Take 1 tablet (40 mg total) by mouth daily. 90 tablet 3  ? spironolactone (ALDACTONE) 25 MG tablet TAKE 1 TABLET (25 MG TOTAL) BY MOUTH DAILY. 90 tablet 3  ? traMADol (ULTRAM) 50 MG tablet Take 50 mg by mouth as needed for moderate pain.    ? XARELTO 20 MG TABS tablet TAKE 1 TABLET BY MOUTH EVERY DAY  WITH SUPPER 90 tablet 1  ? ?No current facility-administered medications for this visit.  ? ? ?Physical Exam: ?Vitals:  ? 01/28/22 1606  ?BP: 110/74  ?Pulse: 66  ?SpO2: 90%  ?Weight: 227 lb 6.4 oz (103

## 2022-02-08 NOTE — Progress Notes (Signed)
?Cardiology Office Note:   ? ?Date:  02/10/2022  ? ?ID:  Cynthia Herrera, DOB Dec 29, 1945, MRN [id number] ? ?PCP:  Isaac Bliss, Rayford Halsted, MD ?  ?[facility] Providers ?Cardiologist:  [name], MD    ? ?Referring MD: Isaac Bliss, [name]  ? ?Chief Complaint: follow-up frequent PVCs, dyspnea ? ?History of Present Illness:   ? ?Cynthia Herrera is a 76 y.o. 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  ? ?Underwent atrial fib ablation 11/2018. She was seen in our office 01/01/22 by Nicholes Rough, PA at which time she was experiencing fatigue and SOB, the symptoms she most often experiences with a fib. Recent respiratory infection thought to be contributing although BNP > 1200. She did not appear volume overloaded and weight was stable. Lasix was increased. She was then seen in our office by Dr. Rayann Heman on 01/28/22. Event monitor revealed frequent PVCs as well as nonsustained atrial tachycardia (no longer than 11.5 seconds), no a fib. Echo 01/21/22 reviewed by Dr. Rayann Heman who felt that LVEF 45-50% primarily related to diet, obesity and sedentary lifestyle, not PVCs. He discussed enrolling her in the Alleviate HF trial for arrhythmia and CHF management. Not interested in AAD therapy and ablation was not recommended. Pro BNP on 01/08/22 was 481.  ? ?Today, she is here for follow-up.  Reports that she will be enrolling in the Alleviate HF trial in May, has an appointment with Dr. Rayann Heman 5/24.  Weighs daily and takes extra Lasix if she is more than 2 pounds in a day. Also monitors legs for swelling and takes extra Lasix if leg edema increases.  Had left mastectomy and has lymphedema on left side. Occasionally feels lightheaded but no presyncope, syncope. Is not very active. Severe fatigue is symptom of a fib, has had some days where  she feels very fatigued while other days she has more energy. Some shortness of breath. No palpitations, chest pain, orthopnea, or PND.  ? ?Past Medical History:  ?Diagnosis Date  ? Arthritis   ? Breast cancer, left breast [facility]) 2002  ? "then treated w/chemo and radiation"  ? Breast cancer, left breast ([place]) 07/14/2017  ? "tx'd w/mastectomy"  ? Chronic back pain   ? h/o lumbar stenosis; "no problem since my OR" (`10/27/2017)  ? Chronic systolic CHF (congestive heart failure) ([place])   ? Complication of anesthesia   ? last surgery iv med when going to sleep"burned as injected"  ? COPD (chronic obstructive pulmonary disease) ([place])   ? no inhalers   ? DCM (dilated cardiomyopathy) ([place])   ? EF 15-20% ? tachycardia induced - EF 35-40% by echo 05/2018  ? Dyspnea   ? with exertion  ? Gallstones   ? GERD (gastroesophageal reflux disease)   ? once in a while;depends on what she eats  ? History of bronchitis   ? "chronic when I smoked; no problem since I quit in 2002" (10/27/2017)  ? Hyperlipidemia   ? Hypertension   ? takes Losartan and Metoprolol.   ? Joint pain   ? Muscle spasm   ? takes Flexeril daily as needed   ? Nocturia   ? OSA (obstructive sleep apnea) 08/22/2018  ? Severe obstructive sleep apnea with an AHI 47.3/h and no significant central sleep apnea with moderate oxygen desaturations as low as 79%  ? Osteopenia   ?  Persistent atrial fibrillation ([place])   ? s/p TEE DCCV and repeat DCCV 11/14/2013 and 02/2018  ? Personal history of colonic polyps   ? adenomas 03 and 08  ? Pneumonia   ? hx  ? Seasonal allergies   ? Thyroid nodule   ? Type II diabetes mellitus ([place])   ? Vitamin D deficiency   ? takes Vit D  ? ? ?Past Surgical History:  ?Procedure Laterality Date  ? ATRIAL FIBRILLATION ABLATION N/A 12/12/2018  ? Procedure: ATRIAL FIBRILLATION ABLATION;  Surgeon: Thompson Grayer, MD;  Location: [place] CV LAB;  Service: Cardiovascular;  Laterality: N/A;  ? AXILLARY LYMPH NODE DISSECTION Left 10/2001  ? persistent intramammary  node Archie Endo 03/09/2011  ? BACK SURGERY    ? BREAST BIOPSY Left 2002  ? BREAST BIOPSY Right 06/2017  ? "node bx was negative"  ? BREAST BIOPSY Left 06/2017  ? "positive for cancer"  ? BREAST LUMPECTOMY WITH RADIOACTIVE SEED LOCALIZATION Right 08/15/2017  ? Procedure: RIGHT BREAST LUMPECTOMY WITH RADIOACTIVE SEED LOCALIZATION;  Surgeon: Jovita Kussmaul, MD;  Location: [place];  Service: General;  Laterality: Right;  ? CARDIAC CATHETERIZATION  2015  ? CARDIOVERSION N/A 11/12/2013  ? Procedure: CARDIOVERSION;  Surgeon: Thayer Headings, MD;  Location: [facility] ENDOSCOPY;  Service: Cardiovascular;  Laterality: N/A;  10:08  Dr. Marissa Nestle, anesthesia present, Lido   '60mg'$ ,  propofol '50mg'$ , IV for elective cardioversion....Dr. Cathie Olden delievered synch 120 joules with successful cardioversion to NSR  ? CARDIOVERSION N/A 11/12/2013  ? Procedure: CARDIOVERSION;  Surgeon: Thayer Headings, MD;  Location: [place];  Service: Cardiovascular;  Laterality: N/A;  ? CARDIOVERSION N/A 11/14/2013  ? Procedure: CARDIOVERSION (BEDSIDE);  Surgeon: Larey Dresser, MD;  Location: [place];  Service: Cardiovascular;  Laterality: N/A;  ? CARDIOVERSION N/A 02/22/2018  ? Procedure: CARDIOVERSION;  Surgeon: Sueanne Margarita, MD;  Location: [facility] ENDOSCOPY;  Service: Cardiovascular;  Laterality: N/A;  ? CATARACT EXTRACTION W/ INTRAOCULAR LENS  IMPLANT, BILATERAL Bilateral ~ 2016  ? COLONOSCOPY  2003, September 2008, April 01, 2011  ? adenomas 03 and 08, polyp 12, diverticulosis  ? COLONOSCOPY WITH PROPOFOL N/A 11/16/2016  ? Procedure: COLONOSCOPY WITH PROPOFOL;  Surgeon: Gatha Mayer, MD;  Location: [facility] ENDOSCOPY;  Service: Endoscopy;  Laterality: N/A;  ? JOINT REPLACEMENT    ? LAPAROSCOPIC CHOLECYSTECTOMY  2004  ? LEFT AND RIGHT HEART CATHETERIZATION WITH CORONARY ANGIOGRAM N/A 02/01/2014  ? Procedure: LEFT AND RIGHT HEART CATHETERIZATION WITH CORONARY ANGIOGRAM;  Surgeon: Sueanne Margarita, MD;  Location: [place] CATH LAB;  Service: Cardiovascular;  Laterality: N/A;  ? LUMBAR  LAMINECTOMY/DECOMPRESSION MICRODISCECTOMY N/A 03/19/2014  ? Procedure: LUMBAR LAMINECTOMY/DECOMPRESSION MICRODISCECTOMY 4 LEVEL;  Surgeon: Kristeen Miss, MD;  Location: [place] NEURO ORS;  Service: Neurosurgery;  Laterality: N/A;  L1-2 L2-3 L3-4 L4-5 Laminectomy/Foraminotomy  ? MASS EXCISION Left 08/15/2017  ? Procedure: EXCISION OF LEFT BREAST MASS;  Surgeon: Jovita Kussmaul, MD;  Location: [place];  Service: General;  Laterality: Left;  ? MASTECTOMY Left 10/27/2017  ? MASTECTOMY PARTIAL / LUMPECTOMY W/ AXILLARY LYMPHADENECTOMY  05/08/2001  ? Archie Endo 03/09/2011  ? [place] REMOVAL  2003  ? PORTA CATH INSERTION  2002  ? TEE WITHOUT CARDIOVERSION N/A 11/12/2013  ? Procedure: TRANSESOPHAGEAL ECHOCARDIOGRAM (TEE);  Surgeon: Thayer Headings, MD;  Location: [place];  Service: Cardiovascular;  Laterality: N/A;  pt b/p low, pt buccal membranes very dry, lips scapped, pt c/o thirst. NPO since MN and iv FLUIDS TOTAL INFUSING AT TOTAL 20ML HR....  ?Dr. Cathie Olden  order allow NS to bolus during procedure....very dry,NS bolus 250 ml total..pt responding well to meds..  ? TOTAL KNEE ARTHROPLASTY Left 2006  ? TOTAL KNEE ARTHROPLASTY Right 05/10/2016  ? Procedure: RIGHT TOTAL KNEE ARTHROPLASTY;  Surgeon: Gaynelle Arabian, MD;  [facility]: [facility] ORS;  Service: Orthopedics;  Laterality: Right;  With adductor block  ? TOTAL MASTECTOMY Left 10/27/2017  ? Procedure: LEFT MASTECTOMY;  Surgeon: Jovita Kussmaul, MD;  Location: [place];  Service: General;  Laterality: Left;  ? TUBAL LIGATION    ? ? ?Current Medications: ?Current Meds  ?Medication Sig  ? atorvastatin (LIPITOR) 40 MG tablet TAKE 1 TABLET BY MOUTH EVERYDAY AT BEDTIME  ? Cholecalciferol (VITAMIN D3) 2000 units capsule Take 2,000 Units by mouth daily.  ? clotrimazole-betamethasone (LOTRISONE) cream Apply 1 application topically 2 (two) times daily.  ? colchicine 0.6 MG tablet TAKE 1 TABLET BY MOUTH 2 TIMES DAILY FOR 21 DAYS.  ? empagliflozin (JARDIANCE) 10 MG TABS tablet Take 1 tablet (10 mg total)  by mouth daily before breakfast.  ? fexofenadine (ALLEGRA) 180 MG tablet Take 180 mg by mouth daily as needed for allergies or rhinitis.  ? fluconazole (DIFLUCAN) 150 MG tablet TAKE 1 TABLET BY MOUTH ONE

## 2022-02-09 ENCOUNTER — Ambulatory Visit: Payer: Medicare Other | Admitting: Internal Medicine

## 2022-02-10 ENCOUNTER — Encounter: Payer: Self-pay | Admitting: Nurse Practitioner

## 2022-02-10 ENCOUNTER — Ambulatory Visit: Payer: Medicare Other | Admitting: Nurse Practitioner

## 2022-02-10 VITALS — BP 108/60 | HR 58 | Ht 66.0 in | Wt 228.6 lb

## 2022-02-10 DIAGNOSIS — I4819 Other persistent atrial fibrillation: Secondary | ICD-10-CM

## 2022-02-10 DIAGNOSIS — I10 Essential (primary) hypertension: Secondary | ICD-10-CM | POA: Diagnosis not present

## 2022-02-10 DIAGNOSIS — I5042 Chronic combined systolic (congestive) and diastolic (congestive) heart failure: Secondary | ICD-10-CM

## 2022-02-10 DIAGNOSIS — I428 Other cardiomyopathies: Secondary | ICD-10-CM

## 2022-02-10 DIAGNOSIS — I493 Ventricular premature depolarization: Secondary | ICD-10-CM | POA: Diagnosis not present

## 2022-02-10 NOTE — Patient Instructions (Signed)
Medication Instructions:  ? ?Your physician recommends that you continue on your current medications as directed. Please refer to the Current Medication list given to you today. ? ? ?*If you need a refill on your cardiac medications before your next appointment, please call your pharmacy* ? ? ?Lab Work: ? ?TODAY!!!! BMET ? ? ?If you have labs (blood work) drawn today and your tests are completely normal, you will receive your results only by: ?MyChart Message (if you have MyChart) OR ?A paper copy in the mail ?If you have any lab test that is abnormal or we need to change your treatment, we will call you to review the results. ? ?Follow-Up: ?At [facility], you and your health needs are our priority.  As part of our continuing mission to provide you with exceptional heart care, we have created designated Provider Care Teams.  These Care Teams include your primary Cardiologist (physician) and Advanced Practice Providers (APPs -  Physician Assistants and Nurse Practitioners) who all work together to provide you with the care you need, when you need it. ? ?We recommend signing up for the patient portal called "MyChart".  Sign up information is provided on this After Visit Summary.  MyChart is used to connect with patients for Virtual Visits (Telemedicine).  Patients are able to view lab/test results, encounter notes, upcoming appointments, etc.  Non-urgent messages can be sent to your provider as well.   ?To learn more about what you can do with MyChart, go to [email or web address].   ? ?Your next appointment:   ?6 month(s) ? ?The format for your next appointment:   ?In Person ? ?Provider:   ?[name], MD   ? ?Important Information About Sugar ? ? ? ? ?  ?

## 2022-02-11 ENCOUNTER — Other Ambulatory Visit: Payer: Self-pay | Admitting: Cardiology

## 2022-02-11 ENCOUNTER — Other Ambulatory Visit: Payer: Self-pay | Admitting: Internal Medicine

## 2022-02-11 DIAGNOSIS — M109 Gout, unspecified: Secondary | ICD-10-CM

## 2022-02-11 DIAGNOSIS — E1140 Type 2 diabetes mellitus with diabetic neuropathy, unspecified: Secondary | ICD-10-CM

## 2022-02-11 LAB — BASIC METABOLIC PANEL
BUN/Creatinine Ratio: 14 (ref 12–28)
BUN: 20 mg/dL (ref 8–27)
CO2: 20 mmol/L (ref 20–29)
Calcium: 9.8 mg/dL (ref 8.7–10.3)
Chloride: 105 mmol/L (ref 96–106)
Creatinine, Ser: 1.41 mg/dL — ABNORMAL HIGH (ref 0.57–1.00)
Glucose: 216 mg/dL — ABNORMAL HIGH (ref 70–99)
Potassium: 4.6 mmol/L (ref 3.5–5.2)
Sodium: 143 mmol/L (ref 134–144)
eGFR: 39 mL/min/{1.73_m2} — ABNORMAL LOW (ref >59)

## 2022-02-14 ENCOUNTER — Telehealth: Payer: Self-pay | Admitting: Internal Medicine

## 2022-02-14 NOTE — Telephone Encounter (Signed)
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 ?Plan ? - full PFT an dHRCT first avail ? - then OV with me - 15 min ?

## 2022-02-17 NOTE — Telephone Encounter (Signed)
Called pt to let her know the info per MR and when stated to pt that he wanted [place] to get her scheduled for OV with PFT prior, pt stated okay. ? ?When about to get her scheduled for appts, line got disconnected. Tried to call pt back but the line kept ringing busy. ?

## 2022-02-17 NOTE — Telephone Encounter (Signed)
Attempted to call patient, patient answered and hung up/ disconnected when I told her why I was calling.  ?

## 2022-02-17 NOTE — Chronic Care Management (AMB) (Signed)
I spoke with Skyla at [facility]. They do not have an application on file for Jardiance pap.  ?

## 2022-02-23 ENCOUNTER — Telehealth: Payer: Self-pay | Admitting: Pharmacist

## 2022-02-23 NOTE — Chronic Care Management (AMB) (Signed)
? ? ?  Chronic Care Management ?Pharmacy Assistant  ? ?Name: Cynthia Herrera  MRN: [id number] DOB: [birth year] ? ?02/24/2022 APPOINTMENT REMINDER ? ?Called [name], No answer, left message of appointment on 02/24/2022 at 2:00 via telephone visit with [name], Pharm D. Notified to have all medications, supplements, blood pressure and/or blood sugar logs available during appointment and to return call if need to reschedule. ? ?Care Gaps: ?AWV - previous message sent to Ramond Craver ?Last BP - 108/60 on 02/10/2022 ?Last A1C - 6.9 on 11/11/2021 ?Hep C Screen - never done ?Shingrix - never done ?Foot exam - overdue ?Covid booster - overdue ?Colonoscopy - overdue ? ?Star Rating Drug: ?Atorvastatin '40mg'$  - [facility] ? Losartan '25mg'$  - last filled [facility]/11/2022 [facility]  ?Metformin '500mg'$  - last filled on 09/17/2021 [facility] verified with Brandi ? ?Any gaps in medications fill history? Yes ? ?Gennie Alma CMA  ?Clinical Pharmacist Assistant ?[phone] ? ?

## 2022-02-24 ENCOUNTER — Ambulatory Visit (INDEPENDENT_AMBULATORY_CARE_PROVIDER_SITE_OTHER): Payer: Medicare Other | Admitting: Pharmacist

## 2022-02-24 DIAGNOSIS — E1151 Type 2 diabetes mellitus with diabetic peripheral angiopathy without gangrene: Secondary | ICD-10-CM

## 2022-02-24 DIAGNOSIS — I10 Essential (primary) hypertension: Secondary | ICD-10-CM

## 2022-02-24 NOTE — Progress Notes (Signed)
? ?Chronic Care Management ?Pharmacy Note ? ?03/03/2022 ?Name:  Cynthia Herrera MRN:  [id number] DOB:  [birth year] ? ?Summary: ?A1c not quite at goal < 7% ?Pt cannot afford Jardiance ? ?Recommendations/Changes made from today's visit: ?-Scheduled DEXA ?-Recommended switching to [place] as pt does not qualify for Jardiance patient assistance ?-Recommended increasing calcium intake as she does not want to supplement with calcium ? ?Plan: ?DM assessment in 2 months ?PCP follow up for repeat A1c in 3 months ?Follow up in 6 months ? ?Subjective: ?Cynthia Herrera is an 76 y.o. year old female who is a primary patient of Isaac Bliss, Rayford Halsted, MD.  The CCM team was consulted for assistance with disease management and care coordination needs.   ? ?Engaged with patient by telephone for follow up visit in response to provider referral for pharmacy case management and/or care coordination services.  ? ?Consent to Services:  ?The patient was given information about Chronic Care Management services, agreed to services, and gave verbal consent prior to initiation of services.  Please see initial visit note for detailed documentation.  ? ?Patient Care Team: ?Isaac Bliss, Rayford Halsted, MD as PCP - General (Internal Medicine) ?Sueanne Margarita, MD as PCP - Cardiology (Cardiology) ?Viona Gilmore, [facility] as Pharmacist (Pharmacist) ? ?Recent office visits: ?11/11/21 Lelon Frohlich, MD: Patient presented for DM follow up. Provided Jardiance samples and plan to apply for patient assistance. Prescribed Z-pak.  ? ?Recent consult visits: ?02/10/22 Christen Bame, NP (cardiology): Patient presented for PVC and CHF follow up. No medication changes. Plan for BMET today. ? ?01/28/22 Thompson Grayer, MD (cardiology): Patient presented for PVC and CHF follow up. Plan to enroll in Alleviate HF trial. ? ?01/01/22 Nicholes Rough, PA-C (cardiology): Patient presented for CHF follow up. Increased Lasix to 20 mg BID and continued spironolactone.  Follow up BMP in 1 week and follow up with cardiology in 1 month. ? ?10/23/21 Nicholas Lose, MD (hem/onc): Patient presented for breast cancer follow up. Follow up in 1 year. ? ?09/23/2021 Baldwin Jamaica PA-C (cardiology) - Patient was seen for medication management and additional issues. Changed Colchicine to 0.6 mg as needed. Follow up in 6 months. ? ?Hospital visits: ?None in previous 6 months ? ? ?Objective: ? ?Lab Results  ?Component Value Date  ? CREATININE 1.41 (H) 02/10/2022  ? BUN 20 02/10/2022  ? GFR 62.33 02/03/2017  ? GFRNONAA 42 (L) 07/10/2020  ? GFRAA 49 (L) 07/10/2020  ? NA 143 02/10/2022  ? K 4.6 02/10/2022  ? CALCIUM 9.8 02/10/2022  ? CO2 20 02/10/2022  ? GLUCOSE 216 (H) 02/10/2022  ? ? ?Lab Results  ?Component Value Date/Time  ? HGBA1C 7.3 (A) 03/01/2022 03:03 PM  ? HGBA1C 6.9 (A) 11/11/2021 11:50 AM  ? HGBA1C 6.2 (H) 01/02/2018 02:51 PM  ? HGBA1C 6.2 (H) 10/27/2017 08:06 AM  ? GFR 62.33 02/03/2017 12:07 PM  ? GFR 61.05 11/06/2015 09:59 AM  ? MICROALBUR 1.2 02/03/2017 12:07 PM  ? MICROALBUR 0.7 11/06/2015 09:59 AM  ?  ?Last diabetic Eye exam:  ?Lab Results  ?Component Value Date/Time  ? HMDIABEYEEXA No Retinopathy 12/14/2021 12:00 AM  ?  ?Last diabetic Foot exam: No results found for: HMDIABFOOTEX  ? ?Lab Results  ?Component Value Date  ? CHOL 136 03/26/2021  ? HDL 36 (L) 03/26/2021  ? Albers 72 03/26/2021  ? TRIG 161 (H) 03/26/2021  ? CHOLHDL 3.8 03/26/2021  ? ? ? ?  Latest Ref Rng & Units 12/16/2021  ?  2:32 PM 03/26/2021  ? 10:36 AM 01/01/2020  ? 11:43 AM  ?Hepatic Function  ?Total Protein 6.0 - 8.5 g/dL 6.6   6.3   6.6    ?Albumin 3.7 - 4.7 g/dL 4.1   4.1   4.2    ?AST 0 - 40 IU/L $Remov'14   13   14    'NjTFir$ ?ALT 0 - 32 IU/L $Remov'13   13   13    'tWaHVT$ ?Alk Phosphatase 44 - 121 IU/L 138   128   155    ?Total Bilirubin 0.0 - 1.2 mg/dL 0.4   0.5   0.6    ? ? ?Lab Results  ?Component Value Date/Time  ? TSH 0.803 12/16/2021 02:32 PM  ? TSH 0.488 08/01/2018 03:42 PM  ? TSH 0.86 02/03/2017 12:07 PM  ? ? ? ?  Latest Ref Rng &  Units 12/16/2021  ?  2:32 PM 09/23/2021  ? 11:26 AM 03/26/2021  ? 10:36 AM  ?CBC  ?WBC 3.4 - 10.8 x10E3/uL 8.6   8.4   8.5    ?Hemoglobin 11.1 - 15.9 g/dL 12.7   12.4   11.4    ?Hematocrit 34.0 - 46.6 % 38.6   36.2   34.4    ?Platelets 150 - 450 x10E3/uL 338   281   323    ? ? ?Lab Results  ?Component Value Date/Time  ? VD25OH 28 (L) 12/10/2009 02:01 PM  ? VD25OH 12 (L) 12/10/2008 11:19 AM  ? ? ?Clinical ASCVD: Yes  ?The 10-year ASCVD risk score (Arnett DK, et al., 2019) is: 30.3% ?  Values used to calculate the score: ?    Age: 76 years ?    Sex: Female ?    Is Non-Hispanic African American: No ?    Diabetic: Yes ?    Tobacco smoker: No ?    Systolic Blood Pressure: 817 mmHg ?    Is BP treated: Yes ?    HDL Cholesterol: 36 mg/dL ?    Total Cholesterol: 136 mg/dL   ? ? ?  03/01/2022  ?  3:11 PM 05/07/2021  ? 11:03 AM 02/12/2021  ? 10:32 AM  ?Depression screen PHQ 2/9  ?Decreased Interest 1 0 0  ?Down, Depressed, Hopeless 0 0 0  ?PHQ - 2 Score 1 0 0  ?Altered sleeping 0 0 0  ?Tired, decreased energy 1 1 0  ?Change in appetite 0 0 0  ?Feeling bad or failure about yourself  0 0 0  ?Trouble concentrating 0 0 0  ?Moving slowly or fidgety/restless 0 0 0  ?Suicidal thoughts 0 0 0  ?PHQ-9 Score 2 1 0  ?Difficult doing work/chores Not difficult at all Not difficult at all Not difficult at all  ?  ?CHA2DS2/VAS Stroke Risk Points  Current as of 30 minutes ago  ?   7 >= 2 Points: High Risk  ?1 - 1.99 Points: Medium Risk  ?0 Points: Low Risk  ?  No Change   ?  ? Details   ? This score determines the patient's risk of having a stroke if the  ?patient has atrial fibrillation.  ?  ?  ? Points Metrics  ?1 Has Congestive Heart Failure:  Yes   ? Current as of 30 minutes ago  ?1 Has Vascular Disease:  Yes   ? Current as of 30 minutes ago  ?1 Has Hypertension:  Yes   ? Current as of 30 minutes ago  ?2 Age:  76   ?  Current as of 30 minutes ago  ?1 Has Diabetes:  Yes   ? Current as of 30 minutes ago  ?0 Had Stroke:  No  Had TIA:  No  Had  Thromboembolism:  No   ? Current as of 30 minutes ago  ?1 Female:  Yes   ? Current as of 30 minutes ago  ?  ? ?Social History  ? ?Tobacco Use  ?Smoking Status Former  ? Packs/day: 1.50  ? Years: 35.00  ? Pack years: 52.50  ? Types: Cigarettes  ? Quit date: 08/24/2001  ? Years since quitting: 20.5  ?Smokeless Tobacco Never  ? ?BP Readings from Last 3 Encounters:  ?03/01/22 110/70  ?02/10/22 108/60  ?01/28/22 110/74  ? ?Pulse Readings from Last 3 Encounters:  ?03/01/22 67  ?02/10/22 (!) 58  ?01/28/22 66  ? ?Wt Readings from Last 3 Encounters:  ?03/01/22 231 lb 9.6 oz (105.1 kg)  ?02/10/22 228 lb 9.6 oz (103.7 kg)  ?01/28/22 227 lb 6.4 oz (103.1 kg)  ? ?BMI Readings from Last 3 Encounters:  ?03/01/22 37.38 kg/m?  ?02/10/22 36.90 kg/m?  ?01/28/22 36.70 kg/m?  ? ? ?Assessment/Interventions: Review of patient past medical history, allergies, medications, health status, including review of consultants reports, laboratory and other test data, was performed as part of comprehensive evaluation and provision of chronic care management services.  ? ?SDOH:  (Social Determinants of Health) assessments and interventions performed: No ? ?SDOH Screenings  ? ?Alcohol Screen: Not on file  ?Depression (PHQ2-9): Low Risk   ? PHQ-2 Score: 2  ?Financial Resource Strain: Not on file  ?Food Insecurity: Not on file  ?Housing: Not on file  ?Physical Activity: Not on file  ?Social Connections: Not on file  ?Stress: Not on file  ?Tobacco Use: Medium Risk  ? Smoking Tobacco Use: Former  ? Smokeless Tobacco Use: Never  ? Passive Exposure: Not on file  ?Transportation Needs: Not on file  ? ? ?[place] ? ?Allergies  ?Allergen Reactions  ? Tikosyn [Dofetilide]   ?  Prolonged QT/QTc  ? Zolpidem Other (See Comments)  ?  Hallucinations, up walking around ?? Hallucinations, up walking around  ? Codeine Other (See Comments)  ? Codeine Phosphate Nausea And Vomiting  ? Methylprednisolone Other (See Comments)  ?  Felt really weird w the high dose oral  steroid, but tolerates low doses or oral steroid ?Felt really weird w the high dose oral steroid, but tolerates low doses or oral steroid  ? ? ?Medications Reviewed Today   ? ? Reviewed by Isaac Bliss,

## 2022-03-01 ENCOUNTER — Encounter: Payer: Self-pay | Admitting: Internal Medicine

## 2022-03-01 ENCOUNTER — Ambulatory Visit (INDEPENDENT_AMBULATORY_CARE_PROVIDER_SITE_OTHER): Payer: Medicare Other | Admitting: Internal Medicine

## 2022-03-01 VITALS — BP 110/70 | HR 67 | Temp 98.0°F | Wt 231.6 lb

## 2022-03-01 DIAGNOSIS — R0609 Other forms of dyspnea: Secondary | ICD-10-CM

## 2022-03-01 DIAGNOSIS — Z1211 Encounter for screening for malignant neoplasm of colon: Secondary | ICD-10-CM | POA: Diagnosis not present

## 2022-03-01 DIAGNOSIS — I70209 Unspecified atherosclerosis of native arteries of extremities, unspecified extremity: Secondary | ICD-10-CM

## 2022-03-01 DIAGNOSIS — E1151 Type 2 diabetes mellitus with diabetic peripheral angiopathy without gangrene: Secondary | ICD-10-CM | POA: Diagnosis not present

## 2022-03-01 LAB — POCT GLYCOSYLATED HEMOGLOBIN (HGB A1C): Hemoglobin A1C: 7.3 % — AB (ref 4.0–5.6)

## 2022-03-01 MED ORDER — EMPAGLIFLOZIN 25 MG PO TABS: 25.0000 mg | ORAL_TABLET | Freq: Every day | ORAL | 1 refills | Status: DC

## 2022-03-01 MED ORDER — EMPAGLIFLOZIN 25 MG PO TABS: 25.0000 mg | ORAL_TABLET | Freq: Every day | ORAL | 0 refills | Status: DC

## 2022-03-01 NOTE — Patient Instructions (Signed)
-  Nice seeing you today!! ? ?-Increase jardiance to 25 mg daily. ? ?-Schedule follow up in 3 months. ?

## 2022-03-01 NOTE — Progress Notes (Signed)
? ? ? ?Established Patient Office Visit ? ? ? ? ?CC/Reason for Visit: Follow-up chronic medical conditions ? ?HPI: Cynthia Herrera is a 76 y.o. female who is coming in today for the above mentioned reasons. Past Medical History is significant for: Type 2 diabetes with peripheral neuropathy, A. fib and chronic combined heart failure followed by cardiology, history of breast cancer followed by oncology on anastrozole, she also has a history of COPD and chronic dyspnea on exertion, hypertension, hyperlipidemia, obstructive sleep apnea and osteopenia.  Since I last saw her she has had progressive dyspnea on exertion.  She has seen cardiology.  They have enrolled her in a trial to start in May.  She has met with pulmonary who has scheduled her for PFTs and high-resolution CT scan.  I mainly follow her for diabetes.  Her most recent addition was Jardiance 10 mg daily.  Her last A1c was 6.9. ? ? ?Past Medical/Surgical History: ?Past Medical History:  ?Diagnosis Date  ? Arthritis   ? Breast cancer, left breast [facility]) 2002  ? "then treated w/chemo and radiation"  ? Breast cancer, left breast ([place]) 07/14/2017  ? "tx'd w/mastectomy"  ? Chronic back pain   ? h/o lumbar stenosis; "no problem since my OR" (`10/27/2017)  ? Chronic systolic CHF (congestive heart failure) ([place])   ? Complication of anesthesia   ? last surgery iv med when going to sleep"burned as injected"  ? COPD (chronic obstructive pulmonary disease) ([place])   ? no inhalers   ? DCM (dilated cardiomyopathy) ([place])   ? EF 15-20% ? tachycardia induced - EF 35-40% by echo 05/2018  ? Dyspnea   ? with exertion  ? Gallstones   ? GERD (gastroesophageal reflux disease)   ? once in a while;depends on what she eats  ? History of bronchitis   ? "chronic when I smoked; no problem since I quit in 2002" (10/27/2017)  ? Hyperlipidemia   ? Hypertension   ? takes Losartan and Metoprolol.   ? Joint pain   ? Muscle spasm   ? takes Flexeril daily as needed   ? Nocturia   ? OSA (obstructive  sleep apnea) 08/22/2018  ? Severe obstructive sleep apnea with an AHI 47.3/h and no significant central sleep apnea with moderate oxygen desaturations as low as 79%  ? Osteopenia   ? Persistent atrial fibrillation ([place])   ? s/p TEE DCCV and repeat DCCV 11/14/2013 and 02/2018  ? Personal history of colonic polyps   ? adenomas 03 and 08  ? Pneumonia   ? hx  ? Seasonal allergies   ? Thyroid nodule   ? Type II diabetes mellitus ([place])   ? Vitamin D deficiency   ? takes Vit D  ? ? ?Past Surgical History:  ?Procedure Laterality Date  ? ATRIAL FIBRILLATION ABLATION N/A 12/12/2018  ? Procedure: ATRIAL FIBRILLATION ABLATION;  Surgeon: Thompson Grayer, MD;  Location: [place] CV LAB;  Service: Cardiovascular;  Laterality: N/A;  ? AXILLARY LYMPH NODE DISSECTION Left 10/2001  ? persistent intramammary node Archie Endo 03/09/2011  ? BACK SURGERY    ? BREAST BIOPSY Left 2002  ? BREAST BIOPSY Right 06/2017  ? "node bx was negative"  ? BREAST BIOPSY Left 06/2017  ? "positive for cancer"  ? BREAST LUMPECTOMY WITH RADIOACTIVE SEED LOCALIZATION Right 08/15/2017  ? Procedure: RIGHT BREAST LUMPECTOMY WITH RADIOACTIVE SEED LOCALIZATION;  Surgeon: Jovita Kussmaul, MD;  Location: [place];  Service: General;  Laterality: Right;  ? CARDIAC CATHETERIZATION  2015  ?  CARDIOVERSION N/A 11/12/2013  ? Procedure: CARDIOVERSION;  Surgeon: Thayer Headings, MD;  Location: [facility] ENDOSCOPY;  Service: Cardiovascular;  Laterality: N/A;  10:08  Dr. Marissa Nestle, anesthesia present, [place]   '60mg'$ ,  propofol $RemoveB'50mg'[id number]$ , IV for elective cardioversion....Dr. Cathie Olden delievered synch 120 joules with successful cardioversion to NSR  ? CARDIOVERSION N/A 11/12/2013  ? Procedure: CARDIOVERSION;  Surgeon: Thayer Headings, MD;  Location: [place];  Service: Cardiovascular;  Laterality: N/A;  ? CARDIOVERSION N/A 11/14/2013  ? Procedure: CARDIOVERSION (BEDSIDE);  Surgeon: Larey Dresser, MD;  Location: [place];  Service: Cardiovascular;  Laterality: N/A;  ? CARDIOVERSION N/A 02/22/2018  ?  Procedure: CARDIOVERSION;  Surgeon: Sueanne Margarita, MD;  Location: [facility] ENDOSCOPY;  Service: Cardiovascular;  Laterality: N/A;  ? CATARACT EXTRACTION W/ INTRAOCULAR LENS  IMPLANT, BILATERAL Bilateral ~ 2016  ? COLONOSCOPY  2003, September 2008, April 01, 2011  ? adenomas 03 and 08, polyp 12, diverticulosis  ? COLONOSCOPY WITH PROPOFOL N/A 11/16/2016  ? Procedure: COLONOSCOPY WITH PROPOFOL;  Surgeon: Gatha Mayer, MD;  Location: [facility] ENDOSCOPY;  Service: Endoscopy;  Laterality: N/A;  ? JOINT REPLACEMENT    ? LAPAROSCOPIC CHOLECYSTECTOMY  2004  ? LEFT AND RIGHT HEART CATHETERIZATION WITH CORONARY ANGIOGRAM N/A 02/01/2014  ? Procedure: LEFT AND RIGHT HEART CATHETERIZATION WITH CORONARY ANGIOGRAM;  Surgeon: Sueanne Margarita, MD;  Location: [place] CATH LAB;  Service: Cardiovascular;  Laterality: N/A;  ? LUMBAR LAMINECTOMY/DECOMPRESSION MICRODISCECTOMY N/A 03/19/2014  ? Procedure: LUMBAR LAMINECTOMY/DECOMPRESSION MICRODISCECTOMY 4 LEVEL;  Surgeon: Kristeen Miss, MD;  Location: [place] NEURO ORS;  Service: Neurosurgery;  Laterality: N/A;  L1-2 L2-3 L3-4 L4-5 Laminectomy/Foraminotomy  ? MASS EXCISION Left 08/15/2017  ? Procedure: EXCISION OF LEFT BREAST MASS;  Surgeon: Jovita Kussmaul, MD;  Location: [place];  Service: General;  Laterality: Left;  ? MASTECTOMY Left 10/27/2017  ? MASTECTOMY PARTIAL / LUMPECTOMY W/ AXILLARY LYMPHADENECTOMY  05/08/2001  ? Archie Endo 03/09/2011  ? [place] REMOVAL  2003  ? PORTA CATH INSERTION  2002  ? TEE WITHOUT CARDIOVERSION N/A 11/12/2013  ? Procedure: TRANSESOPHAGEAL ECHOCARDIOGRAM (TEE);  Surgeon: Thayer Headings, MD;  Location: [place];  Service: Cardiovascular;  Laterality: N/A;  pt b/p low, pt buccal membranes very dry, lips scapped, pt c/o thirst. NPO since MN and iv FLUIDS TOTAL INFUSING AT TOTAL 20ML HR....  ?Dr. Cathie Olden order allow NS to bolus during procedure....very dry,NS bolus 250 ml total..pt responding well to meds..  ? TOTAL KNEE ARTHROPLASTY Left 2006  ? TOTAL KNEE ARTHROPLASTY Right 05/10/2016   ? Procedure: RIGHT TOTAL KNEE ARTHROPLASTY;  Surgeon: Gaynelle Arabian, MD;  Location: [facility] ORS;  Service: Orthopedics;  Laterality: Right;  With adductor block  ? TOTAL MASTECTOMY Left 10/27/2017  ? Procedure: LEFT MASTECTOMY;  Surgeon: Jovita Kussmaul, MD;  Location: [place];  Service: General;  Laterality: Left;  ? TUBAL LIGATION    ? ? ?Social History: ? reports that she quit smoking about 20 years ago. Her smoking use included cigarettes. She has a 52.50 pack-year smoking history. She has never used smokeless tobacco. She reports that she does not drink alcohol and does not use drugs. ? ?Allergies: ?Allergies  ?Allergen Reactions  ? Tikosyn [Dofetilide]   ?  Prolonged QT/QTc  ? Zolpidem Other (See Comments)  ?  Hallucinations, up walking around ?? Hallucinations, up walking around  ? Codeine Other (See Comments)  ? Codeine Phosphate Nausea And Vomiting  ? Methylprednisolone Other (See Comments)  ?  Felt really weird w the high dose oral steroid,  but tolerates low doses or oral steroid ?Felt really weird w the high dose oral steroid, but tolerates low doses or oral steroid  ? ? ?Family History:  ?Family History  ?Problem Relation Age of Onset  ? Dementia Sister   ? Diabetes Sister   ? Alzheimer's disease Sister   ? Diabetes Brother   ?     x 2  ? Heart disease Brother   ? Irritable bowel syndrome Brother   ? Liver cancer Mother   ? Diabetes Mother   ? Pancreatic cancer Mother   ? Diabetes Father   ? Heart disease Father   ? ? ? ?Current Outpatient Medications:  ?  atorvastatin (LIPITOR) 40 MG tablet, TAKE 1 TABLET BY MOUTH EVERYDAY AT BEDTIME, Disp: 90 tablet, Rfl: 1 ?  Cholecalciferol (VITAMIN D3) 2000 units capsule, Take 2,000 Units by mouth daily., Disp: , Rfl:  ?  clotrimazole-betamethasone (LOTRISONE) cream, Apply 1 application topically 2 (two) times daily., Disp: 30 g, Rfl: 0 ?  colchicine 0.6 MG tablet, TAKE 1 TABLET BY MOUTH 2 TIMES DAILY FOR 21 DAYS., Disp: 42 tablet, Rfl: 1 ?  empagliflozin (JARDIANCE) 25 MG  TABS tablet, Take 1 tablet (25 mg total) by mouth daily before breakfast., Disp: 90 tablet, Rfl: 1 ?  fexofenadine (ALLEGRA) 180 MG tablet, Take 180 mg by mouth daily as needed for allergies or rhinitis., Disp

## 2022-03-04 ENCOUNTER — Ambulatory Visit (INDEPENDENT_AMBULATORY_CARE_PROVIDER_SITE_OTHER)
Admission: RE | Admit: 2022-03-04 | Discharge: 2022-03-04 | Disposition: A | Discharge status: Home / Self Care | Payer: Medicare Other | Source: Ambulatory Visit | Attending: Internal Medicine | Admitting: Internal Medicine

## 2022-03-04 ENCOUNTER — Other Ambulatory Visit: Payer: Self-pay | Admitting: Cardiology

## 2022-03-04 DIAGNOSIS — M8588 Other specified disorders of bone density and structure, other site: Secondary | ICD-10-CM

## 2022-03-04 DIAGNOSIS — M8580 Other specified disorders of bone density and structure, unspecified site: Secondary | ICD-10-CM

## 2022-03-08 ENCOUNTER — Other Ambulatory Visit: Payer: Self-pay | Admitting: Internal Medicine

## 2022-03-08 DIAGNOSIS — E1151 Type 2 diabetes mellitus with diabetic peripheral angiopathy without gangrene: Secondary | ICD-10-CM

## 2022-03-08 MED ORDER — DAPAGLIFLOZIN PROPANEDIOL 10 MG PO TABS: 10.0000 mg | ORAL_TABLET | Freq: Every day | ORAL | 1 refills | Status: AC

## 2022-03-11 ENCOUNTER — Other Ambulatory Visit: Payer: Self-pay | Admitting: Internal Medicine

## 2022-03-17 ENCOUNTER — Ambulatory Visit (INDEPENDENT_AMBULATORY_CARE_PROVIDER_SITE_OTHER): Payer: Medicare Other | Admitting: Internal Medicine

## 2022-03-17 ENCOUNTER — Encounter: Payer: Self-pay | Admitting: Internal Medicine

## 2022-03-17 VITALS — BP 94/58 | HR 70 | Ht 66.0 in | Wt 230.6 lb

## 2022-03-17 DIAGNOSIS — I480 Paroxysmal atrial fibrillation: Secondary | ICD-10-CM

## 2022-03-17 DIAGNOSIS — D6869 Other thrombophilia: Secondary | ICD-10-CM

## 2022-03-17 DIAGNOSIS — I5032 Chronic diastolic (congestive) heart failure: Secondary | ICD-10-CM

## 2022-03-17 HISTORY — PX: OTHER SURGICAL HISTORY: SHX169

## 2022-03-17 MED ORDER — POTASSIUM CHLORIDE ER 10 MEQ PO TBCR: EXTENDED_RELEASE_TABLET | ORAL | 3 refills | Status: AC

## 2022-03-17 MED ORDER — FUROSEMIDE 20 MG PO TABS: ORAL_TABLET | ORAL | 3 refills | Status: AC

## 2022-03-17 NOTE — Patient Instructions (Signed)
Medication Instructions:  Your physician recommends that you continue on your current medications as directed. Please refer to the Current Medication list given to you today.  *If you need a refill on your cardiac medications before your next appointment, please call your pharmacy*   Lab Work: None ordered   Testing/Procedures: None ordered   Follow-Up: At [facility], you and your health needs are our priority.  As part of our continuing mission to provide you with exceptional heart care, we have created designated Provider Care Teams.  These Care Teams include your primary Cardiologist (physician) and Advanced Practice Providers (APPs -  Physician Assistants and Nurse Practitioners) who all work together to provide you with the care you need, when you need it.  Your next appointment:   3 month(s)  The format for your next appointment:   In Person  Provider:   Tommye Standard, PA-C{   Thank you for choosing [facility]!!   [phone]  Other Instructions  Important Information About Sugar        Implantable Loop Recorder Placement, Care After This sheet gives you information about how to care for yourself after your procedure. Your health care provider may also give you more specific instructions. If you have problems or questions, contact your health care provider. What can I expect after the procedure? After the procedure, it is common to have: Soreness or discomfort near the incision. Some swelling or bruising near the incision.  Follow these instructions at home: Incision care  Monitor your cardiac device site for redness, swelling, and drainage. Call the device clinic at [phone] if you experience these symptoms or fever/chills.  Keep the large square bandage on your site for 24 hours and then you may remove it yourself. Keep the steri-strips underneath in place.   You may shower after 72 hours / 3 days from your procedure with the steri-strips  in place. They will usually fall off on their own, or may be removed after 10 days. Pat dry.   Avoid lotions, ointments, or perfumes over your incision until it is well-healed.  Please do not submerge in water until your site is completely healed.   Your device is MRI compatible.   Remote monitoring is used to monitor your cardiac device from home. This monitoring is scheduled every month by our office. It allows [place] to keep an eye on the function of your device to ensure it is working properly.  If your wound site starts to bleed apply pressure.      If you have any questions/concerns please call the device clinic at [phone].  Activity  Return to your normal activities.  General instructions Follow instructions from your health care provider about how to manage your implantable loop recorder and transmit the information. Learn how to activate a recording if this is necessary for your type of device. You may go through a metal detection gate, and you may let someone hold a metal detector over your chest. Show your ID card if needed. Do not have an MRI unless you check with your health care provider first. Take over-the-counter and prescription medicines only as told by your health care provider. Keep all follow-up visits as told by your health care provider. This is important. Contact a health care provider if: You have redness, swelling, or pain around your incision. You have a fever. You have pain that is not relieved by your pain medicine. You have triggered your device because of fainting (syncope) or because of  a heartbeat that feels like it is racing, slow, fluttering, or skipping (palpitations). Get help right away if you have: Chest pain. Difficulty breathing. Summary After the procedure, it is common to have soreness or discomfort near the incision. Change your dressing as told by your health care provider. Follow instructions from your health care provider about how  to manage your implantable loop recorder and transmit the information. Keep all follow-up visits as told by your health care provider. This is important. This information is not intended to replace advice given to you by your health care provider. Make sure you discuss any questions you have with your health care provider. Document Released: 09/22/2015 Document Revised: 11/26/2017 Document Reviewed: 11/26/2017 Elsevier Patient Education  2020 [employer].

## 2022-03-17 NOTE — Addendum Note (Signed)
Addended by: Rubie Maid B on: 03/17/2022 01:23 PM   Modules accepted: Orders

## 2022-03-17 NOTE — Research (Addendum)
Alleviate Informed Consent   Subject Name: Cynthia Herrera  Subject met inclusion and exclusion criteria.  The informed consent form, study requirements and expectations were reviewed with the subject and questions and concerns were addressed prior to the signing of the consent form.  The subject verbalized understanding of the trial requirements.  The subject agreed to participate in the Alleviate  trial and signed the informed consent at 0930 on 03/17/2022.  The informed consent was obtained prior to performance of any protocol-specific procedures for the subject.  A copy of the signed informed consent was given to the subject and a copy was placed in the subject's medical record.   Dimitrios Balestrieri   Reviewed of history and physical by investigator.   INCLUSION _0  _1  Patient has NYHA Class II or III heart failure per most recent assessment, irrespective of left ventricular ejection fraction (LVEF)  _2  _3  Patient has documented recent history of symptomatic heart failure, defined as meeting any one of the following three criteria: 1. Hospital admission with primary diagnosis of HF within the last 12 months, OR 2. Intravenous HF therapy (e.g. IV diuretics/vasodilators) or ultrafiltration within the last 6 months, OR 3. Patient had the following BNP/NT-proBNP6 within the last 3 months: - If LVEF ? 50%, then BNP> 150 pg/ml or NT-proBNP > 450 pg/ml OR - If LVEF is <50%, then BNP> 300 pg/ml or NT-proBNP > 900 pg/ml  _4  _5  Patient is willing and able to comply with the protocol, including LINQ ICM insertion, CareLink transmissions (including adequate connectivity), study visits and remote care directions  _6  _7  Patient is 77 years of age or older  _8  _9  Patient has a life expectancy of 12 months or more    EXCLUSION _10  _11  Patient is currently implanted with a cardiovascular implantable electronic device (CIED)7 (e.g. ICM8, pacemaker, ICD, CRT-D or CRT-P device) or hemodynamic monitor   _12  _13   Patient is receiving temporary or permanent mechanical circulatory support   _14  _15  Patient had MI or PCI/CABG within past 90 days   _16  _17  Patient has had a heart transplant, or is currently on heart transplant list   _18  _19  Patient has severe valve stenosis on echocardiogram   _20  _21  Patient has primary pulmonary hypertension (pre-capillary, WHO group 1,3,4,5)   _22  _23  Patient is on chronic intravenous inotropic drug therapy (e.g. dobutamine, milrinone)   _24  _25  Patient has severe renal impairment (eGFR <30 mL/min)   _26  _27  Patient has systolic blood pressure of < 90 mmHg at the time of enrollment   _28  _29  Patient is on chronic renal dialysis   _30  _31  Patient is unable to undergo one round of PRN medication intervention (i.e. 4 days of increased diuretics dose), based on the judgement of the investigator (e.g. known history of side effects or intolerance to PRN dosing of diuretics)   _32  _33  Patient has liver disease, defined as AST/ALT >5x normal, or bilirubin >2x normal   _34  _35  Patient has serum albumin < 3 g/dL   _36  _37  Patient has hypertrophic obstructive cardiomyopathy, constrictive pericarditis or amyloidosis   _38  _39   Patient has complex adult congenital heart disease   _40  _41  Patient has active cancer involving chemotherapy and/or radiation therapy   _42  _43  Patient weighs more than 500 pounds  _44  _45  Patient is pregnant (all females of child-bearing potential must have a negative pregnancy test within 1 week of enrollment)

## 2022-03-17 NOTE — Progress Notes (Signed)
PCP: Isaac Bliss, Rayford Halsted, MD Primary Cardiologist: Dr Radford Pax Primary EP: Dr Rayann Heman  Cynthia Herrera is a 76 y.o. female who presents today for routine electrophysiology followup.  Since last being seen in our clinic, the patient reports doing very well.  Her SOB is stable.  Edema is stable. Today, she denies symptoms of palpitations, chest pain, dizziness, presyncope, or syncope.  The patient is otherwise without complaint today.   Past Medical History:  Diagnosis Date   Arthritis    Breast cancer, left breast ([employer]) 2002   "then treated w/chemo and radiation"   Breast cancer, left breast ([place]) 07/14/2017   "tx'd w/mastectomy"   Chronic back pain    h/o lumbar stenosis; "no problem since my OR" (`10/27/2017)   Chronic systolic CHF (congestive heart failure) (HCC)    Complication of anesthesia    last surgery iv med when going to sleep"burned as injected"   COPD (chronic obstructive pulmonary disease) ([place])    no inhalers    DCM (dilated cardiomyopathy) ([place])    EF 15-20% ? tachycardia induced - EF 35-40% by echo 05/2018   Dyspnea    with exertion   Gallstones    GERD (gastroesophageal reflux disease)    once in a while;depends on what she eats   History of bronchitis    "chronic when I smoked; no problem since I quit in 2002" (10/27/2017)   Hyperlipidemia    Hypertension    takes Losartan and Metoprolol.    Joint pain    Muscle spasm    takes Flexeril daily as needed    Nocturia    OSA (obstructive sleep apnea) 08/22/2018   Severe obstructive sleep apnea with an AHI 47.3/h and no significant central sleep apnea with moderate oxygen desaturations as low as 79%   Osteopenia    Persistent atrial fibrillation (HCC)    s/p TEE DCCV and repeat DCCV 11/14/2013 and 02/2018   Personal history of colonic polyps    adenomas 03 and 08   Pneumonia    hx   Seasonal allergies    Thyroid nodule    Type II diabetes mellitus ([place])    Vitamin D deficiency    takes Vit D   Past  Surgical History:  Procedure Laterality Date   ATRIAL FIBRILLATION ABLATION N/A 12/12/2018   Procedure: ATRIAL FIBRILLATION ABLATION;  Surgeon: Thompson Grayer, MD;  Location: [place] [facility];  Service: Cardiovascular;  Laterality: N/A;   AXILLARY LYMPH NODE DISSECTION Left 10/2001   persistent intramammary node /notes 03/09/2011   BACK SURGERY     BREAST BIOPSY Left 2002   BREAST BIOPSY Right 06/2017   "node bx was negative"   BREAST BIOPSY Left 06/2017   "positive for cancer"   BREAST LUMPECTOMY WITH RADIOACTIVE SEED LOCALIZATION Right 08/15/2017   Procedure: RIGHT BREAST LUMPECTOMY WITH RADIOACTIVE SEED LOCALIZATION;  Surgeon: Jovita Kussmaul, MD;  Location: [place];  Service: General;  Laterality: Right;   CARDIAC CATHETERIZATION  2015   CARDIOVERSION N/A 11/12/2013   Procedure: CARDIOVERSION;  Surgeon: Thayer Headings, MD;  Location: [place];  Service: Cardiovascular;  Laterality: N/A;  10:08  Dr. Marissa Nestle, anesthesia present, [place]   '60mg'$ ,  propofol '50mg'$ , IV for elective cardioversion....Dr. Cathie Olden delievered synch 120 joules with successful cardioversion to NSR   CARDIOVERSION N/A 11/12/2013   Procedure: CARDIOVERSION;  Surgeon: Thayer Headings, MD;  Location: [place];  Service: Cardiovascular;  Laterality: N/A;   CARDIOVERSION N/A 11/14/2013   Procedure: CARDIOVERSION (  BEDSIDE);  Surgeon: Larey Dresser, MD;  Location: [place];  Service: Cardiovascular;  Laterality: N/A;   CARDIOVERSION N/A 02/22/2018   Procedure: CARDIOVERSION;  Surgeon: Sueanne Margarita, MD;  Location: [facility];  Service: Cardiovascular;  Laterality: N/A;   CATARACT EXTRACTION W/ INTRAOCULAR LENS  IMPLANT, BILATERAL Bilateral ~ 2016   COLONOSCOPY  2003, September 2008, April 01, 2011   adenomas 03 and 08, polyp 12, diverticulosis   COLONOSCOPY WITH PROPOFOL N/A 11/16/2016   Procedure: COLONOSCOPY WITH PROPOFOL;  Surgeon: Gatha Mayer, MD;  Location: [facility];  Service: Endoscopy;  Laterality: N/A;   JOINT  REPLACEMENT     LAPAROSCOPIC CHOLECYSTECTOMY  2004   LEFT AND RIGHT HEART CATHETERIZATION WITH CORONARY ANGIOGRAM N/A 02/01/2014   Procedure: LEFT AND RIGHT HEART CATHETERIZATION WITH CORONARY ANGIOGRAM;  Surgeon: Sueanne Margarita, MD;  Location: [place] C[facility];  Service: Cardiovascular;  Laterality: N/A;   LUMBAR LAMINECTOMY/DECOMPRESSION MICRODISCECTOMY N/A 03/19/2014   Procedure: LUMBAR LAMINECTOMY/DECOMPRESSION MICRODISCECTOMY 4 LEVEL;  Surgeon: Kristeen Miss, MD;  Location: [place] [facility];  Service: Neurosurgery;  Laterality: N/A;  L1-2 L2-3 L3-4 L4-5 Laminectomy/Foraminotomy   MASS EXCISION Left 08/15/2017   Procedure: EXCISION OF LEFT BREAST MASS;  Surgeon: Jovita Kussmaul, MD;  Location: [place];  Service: General;  Laterality: Left;   MASTECTOMY Left 10/27/2017   MASTECTOMY PARTIAL / LUMPECTOMY W/ AXILLARY LYMPHADENECTOMY  05/08/2001   Archie Endo 03/09/2011   PORT-A-CATH REMOVAL  2003   PORTA CATH INSERTION  2002   TEE WITHOUT CARDIOVERSION N/A 11/12/2013   Procedure: TRANSESOPHAGEAL ECHOCARDIOGRAM (TEE);  Surgeon: Thayer Headings, MD;  Location: [place];  Service: Cardiovascular;  Laterality: N/A;  pt b/p low, pt buccal membranes very dry, lips scapped, pt c/o thirst. NPO since MN and iv FLUIDS TOTAL INFUSING AT TOTAL 20ML HR....  Dr. Cathie Olden order allow NS to bolus during procedure....very dry,NS bolus 250 ml total..pt responding well to meds.Marland Kitchen   TOTAL KNEE ARTHROPLASTY Left 2006   TOTAL KNEE ARTHROPLASTY Right 05/10/2016   Procedure: RIGHT TOTAL KNEE ARTHROPLASTY;  Surgeon: Gaynelle Arabian, MD;  Location: [facility];  Service: Orthopedics;  Laterality: Right;  With adductor block   TOTAL MASTECTOMY Left 10/27/2017   Procedure: LEFT MASTECTOMY;  Surgeon: Jovita Kussmaul, MD;  Location: [place];  Service: General;  Laterality: Left;   TUBAL LIGATION      ROS- all systems are reviewed and negatives except as per HPI above  Current Outpatient Medications  Medication Sig Dispense Refill   atorvastatin  (LIPITOR) 40 MG tablet TAKE 1 TABLET BY MOUTH EVERYDAY AT BEDTIME 90 tablet 1   Cholecalciferol (VITAMIN D3) 2000 units capsule Take 2,000 Units by mouth daily.     clotrimazole-betamethasone (LOTRISONE) cream Apply 1 application topically 2 (two) times daily. 30 g 0   colchicine 0.6 MG tablet TAKE 1 TABLET BY MOUTH 2 TIMES DAILY FOR 21 DAYS. 42 tablet 1   dapagliflozin propanediol (FARXIGA) 10 MG TABS tablet Take 1 tablet (10 mg total) by mouth daily before breakfast. 90 tablet 1   fexofenadine (ALLEGRA) 180 MG tablet Take 180 mg by mouth daily as needed for allergies or rhinitis.     fluconazole (DIFLUCAN) 150 MG tablet TAKE 1 TABLET BY MOUTH ONE TIME PER WEEK 6 tablet 2   fluticasone (FLONASE) 50 MCG/ACT nasal spray Place 1 spray into both nostrils daily as needed for allergies.      furosemide (LASIX) 20 MG tablet Take one tablet by mouth twice a day for one  week, then resume one tablet by mouth daily 35 tablet 0   gabapentin (NEURONTIN) 300 MG capsule TAKE 2 CAPSULES BY MOUTH AT BEDTIME 180 capsule 1   glucose blood test strip Use to check CBG AC and QHS. 200 each 12   KLOR-CON M20 20 MEQ tablet TAKE 1 TABLET BY MOUTH EVERY DAY 90 tablet 1   losartan (COZAAR) 25 MG tablet TAKE 1 TABLET BY MOUTH TWICE A DAY 180 tablet 2   metFORMIN (GLUCOPHAGE) 500 MG tablet Take 500 mg by mouth daily.     nebivolol (BYSTOLIC) 2.5 MG tablet TAKE 1 TABLET BY MOUTH EVERY DAY 90 tablet 3   OneTouch Delica Lancets 37S MISC 1 each by Does not apply route daily. 100 each 1   pantoprazole (PROTONIX) 40 MG tablet TAKE 1 TABLET BY MOUTH EVERY DAY 90 tablet 3   spironolactone (ALDACTONE) 25 MG tablet Take 25 mg by mouth daily. Take 1/2 tablet on the days that you are taking 2 of the furosemide tablets.     traMADol (ULTRAM) 50 MG tablet Take 50 mg by mouth as needed for moderate pain.     XARELTO 20 MG TABS tablet TAKE 1 TABLET BY MOUTH EVERY DAY WITH SUPPER 90 tablet 1   No current facility-administered medications  for this visit.    Physical Exam: Vitals:   03/17/22 0920  BP: (!) 94/58  Pulse: 70  SpO2: 90%  Weight: 230 lb 9.6 oz (104.6 kg)  Height: '5\' 6"'$  (1.676 m)    GEN- The patient is well appearing, alert and oriented x 3 today.   Head- normocephalic, atraumatic Eyes-  Sclera clear, conjunctiva pink Ears- hearing intact Oropharynx- clear Lungs- Clear to ausculation bilaterally, normal work of breathing Heart- Regular rate and rhythm, no murmurs, rubs or gallops, PMI not laterally displaced GI- soft, NT, ND, + BS Extremities- no clubbing, cyanosis, or edema  Wt Readings from Last 3 Encounters:  03/17/22 230 lb 9.6 oz (104.6 kg)  03/01/22 231 lb 9.6 oz (105.1 kg)  02/10/22 228 lb 9.6 oz (103.7 kg)    EKG tracing ordered today is personally reviewed and shows sinus with PVCs, stable qtc  Assessment and Plan:  Persistent afib Well controlled post ablation We will follow AF burden with ILR She is on xarelto  2. Chronic combined systolic and diastolic dysfunction She qualifies for alleviate HF trial. She is NYHA Class II currently.  I would therefore advise implantation of an implantable loop recorder for long term arrhythmia monitoring of afib as well as interstitial monitoring with Alleviate HF trial.  Risks and benefits to ILR were discussed at length with the patient today, including but not limited to risks of bleeding and infection.  Extensive device education was performed.  Remote monitoring was also discussed at length today.  The patient understands and wishes to proceed.  We will proceed at this time with ILR implantation.  Return to see EP APP in 3 months  Thompson Grayer MD, [facility] 03/17/2022 10:09 AM      DESCRIPTION OF PROCEDURE:  Informed written consent was obtained.  The patient required no sedation for the procedure today.  The patients left chest was prepped and draped. Mapping over the patient's chest was performed to identify the appropriate ILR site.  This  area was found to be the left parasternal region over the 3rd-4th intercostal space.  The skin overlying this region was infiltrated with lidocaine for local analgesia.  A 0.5-cm incision was made at the implant  site.  A subcutaneous ILR pocket was fashioned using a combination of sharp and blunt dissection.  A Medtronic Reveal Linq model LNQ 11 (SN [id number]) implantable loop recorder was then placed into the pocket R waves were very prominent and measured > 0.2 mV. EBL<1 ml.  Steri- Strips and a sterile dressing were then applied.  There were no early apparent complications.     CONCLUSIONS:   1. Successful implantation of a Medtronic Reveal LINQ implantable loop recorder for Alleviate HF trial  2. No early apparent complications.   Thompson Grayer MD, [facility] 03/17/2022 10:09 AM

## 2022-03-18 LAB — CBC
Hematocrit: 40 % (ref 34.0–46.6)
Hemoglobin: 13.4 g/dL (ref 11.1–15.9)
MCH: 29.8 pg (ref 26.6–33.0)
MCHC: 33.5 g/dL (ref 31.5–35.7)
MCV: 89 fL (ref 79–97)
Platelets: 303 10*3/uL (ref 150–450)
RBC: 4.5 x10E6/uL (ref 3.77–5.28)
RDW: 13.7 % (ref 11.7–15.4)
WBC: 7.5 10*3/uL (ref 3.4–10.8)

## 2022-03-18 LAB — BASIC METABOLIC PANEL
BUN/Creatinine Ratio: 26 (ref 12–28)
BUN: 28 mg/dL — ABNORMAL HIGH (ref 8–27)
CO2: 19 mmol/L — ABNORMAL LOW (ref 20–29)
Calcium: 9.6 mg/dL (ref 8.7–10.3)
Chloride: 104 mmol/L (ref 96–106)
Creatinine, Ser: 1.06 mg/dL — ABNORMAL HIGH (ref 0.57–1.00)
Glucose: 179 mg/dL — ABNORMAL HIGH (ref 70–99)
Potassium: 4.4 mmol/L (ref 3.5–5.2)
Sodium: 140 mmol/L (ref 134–144)
eGFR: 54 mL/min/{1.73_m2} — ABNORMAL LOW (ref >59)

## 2022-03-18 LAB — BRAIN NATRIURETIC PEPTIDE: BNP: 100.3 pg/mL — ABNORMAL HIGH (ref 0.0–100.0)

## 2022-03-19 NOTE — Progress Notes (Signed)
Labs for review

## 2022-03-23 ENCOUNTER — Ambulatory Visit: Payer: Medicare Other | Admitting: Podiatry

## 2022-03-23 DIAGNOSIS — B353 Tinea pedis: Secondary | ICD-10-CM | POA: Diagnosis not present

## 2022-03-23 DIAGNOSIS — E1142 Type 2 diabetes mellitus with diabetic polyneuropathy: Secondary | ICD-10-CM | POA: Diagnosis not present

## 2022-03-23 MED ORDER — FLUCONAZOLE 150 MG PO TABS: ORAL_TABLET | ORAL | 2 refills | Status: DC

## 2022-03-24 DIAGNOSIS — E1159 Type 2 diabetes mellitus with other circulatory complications: Secondary | ICD-10-CM | POA: Diagnosis not present

## 2022-03-24 DIAGNOSIS — I4891 Unspecified atrial fibrillation: Secondary | ICD-10-CM | POA: Diagnosis not present

## 2022-03-24 DIAGNOSIS — I110 Hypertensive heart disease with heart failure: Secondary | ICD-10-CM

## 2022-03-24 DIAGNOSIS — Z87891 Personal history of nicotine dependence: Secondary | ICD-10-CM | POA: Diagnosis not present

## 2022-03-24 DIAGNOSIS — I5040 Unspecified combined systolic (congestive) and diastolic (congestive) heart failure: Secondary | ICD-10-CM

## 2022-03-24 DIAGNOSIS — M8580 Other specified disorders of bone density and structure, unspecified site: Secondary | ICD-10-CM

## 2022-03-24 DIAGNOSIS — Z853 Personal history of malignant neoplasm of breast: Secondary | ICD-10-CM

## 2022-03-24 DIAGNOSIS — Z7984 Long term (current) use of oral hypoglycemic drugs: Secondary | ICD-10-CM

## 2022-03-24 NOTE — Progress Notes (Signed)
  Subjective:  Patient ID: Cynthia Herrera, female    DOB: Jun 12, 1946,  MRN: [id number]  Chief Complaint  Patient presents with   Peripheral Neuropathy      neuropathy in feet needs refill on medication    76 y.o. female presents with the above complaint. History confirmed with patient.  She previously has seen Dr. March Rummage for recurrent tinea pedis, she developed a new flare and is out of the fluconazole which usually alleviates this.  Also has been using clotrimazole over-the-counter cream.  She also has neuropathy and takes gabapentin for this  Objective:  Physical Exam: warm, good capillary refill, no trophic changes or ulcerative lesions, normal DP and PT pulses, and right foot irruption of tinea pedis with erythematous dry scaling areas on the plantar medial and lateral foot some interdigital maceration as well.    Assessment:   1. Tinea pedis of right foot   2. Diabetic peripheral neuropathy ([place])      Plan:  Patient was evaluated and treated and all questions answered.  Discussed further treatment for tinea pedis she has responded well to Diflucan courses in the past.  I refilled her prescription for this and she will use the OTC clotrimazole cream as well.  We also discussed further treatment of her neuropathy and she will discuss with her PCP if it would be worth increasing her dosing to alleviate this further.  No follow-ups on file.

## 2022-04-22 ENCOUNTER — Other Ambulatory Visit: Payer: Self-pay | Admitting: Cardiology

## 2022-04-22 DIAGNOSIS — I4819 Other persistent atrial fibrillation: Secondary | ICD-10-CM

## 2022-04-22 NOTE — Telephone Encounter (Signed)
Prescription refill request for Xarelto received.  Indication:Afib  Last office visit: 03/17/22 ([place])  Weight: 104.6kg Age: 76 Scr: 1.06 (03/17/22) CrCl: 74.66m/min  Appropriate dose and refill sent to requested pharmacy.

## 2022-05-05 ENCOUNTER — Other Ambulatory Visit: Payer: Self-pay | Admitting: *Deleted

## 2022-05-05 DIAGNOSIS — R0602 Shortness of breath: Secondary | ICD-10-CM

## 2022-05-11 ENCOUNTER — Encounter: Payer: Self-pay | Admitting: Internal Medicine

## 2022-05-11 ENCOUNTER — Ambulatory Visit: Payer: Medicare Other | Admitting: Internal Medicine

## 2022-05-11 ENCOUNTER — Ambulatory Visit (INDEPENDENT_AMBULATORY_CARE_PROVIDER_SITE_OTHER): Payer: Medicare Other | Admitting: Internal Medicine

## 2022-05-11 VITALS — BP 130/68 | HR 65 | Temp 98.2°F | Ht 66.0 in | Wt 229.0 lb

## 2022-05-11 DIAGNOSIS — R0602 Shortness of breath: Secondary | ICD-10-CM

## 2022-05-11 DIAGNOSIS — E669 Obesity, unspecified: Secondary | ICD-10-CM

## 2022-05-11 DIAGNOSIS — I519 Heart disease, unspecified: Secondary | ICD-10-CM | POA: Diagnosis not present

## 2022-05-11 DIAGNOSIS — Z006 Encounter for examination for normal comparison and control in clinical research program: Secondary | ICD-10-CM

## 2022-05-11 NOTE — Progress Notes (Signed)
Spirometry and Dlco done today. 

## 2022-05-11 NOTE — Research (Signed)
Alleviate HF Research Study  2 Month Follow-up  No adverse events or medication changes to report at this time.   Current Outpatient Medications:    atorvastatin (LIPITOR) 40 MG tablet, TAKE 1 TABLET BY MOUTH EVERYDAY AT BEDTIME, Disp: 90 tablet, Rfl: 1   Cholecalciferol (VITAMIN D3) 2000 units capsule, Take 2,000 Units by mouth daily., Disp: , Rfl:    clotrimazole-betamethasone (LOTRISONE) cream, Apply 1 application topically 2 (two) times daily., Disp: 30 g, Rfl: 0   colchicine 0.6 MG tablet, TAKE 1 TABLET BY MOUTH 2 TIMES DAILY FOR 21 DAYS., Disp: 42 tablet, Rfl: 1   dapagliflozin propanediol (FARXIGA) 10 MG TABS tablet, Take 1 tablet (10 mg total) by mouth daily before breakfast., Disp: 90 tablet, Rfl: 1   fexofenadine (ALLEGRA) 180 MG tablet, Take 180 mg by mouth daily as needed for allergies or rhinitis., Disp: , Rfl:    fluconazole (DIFLUCAN) 150 MG tablet, TAKE 1 TABLET BY MOUTH ONE TIME PER WEEK, Disp: 6 tablet, Rfl: 2   fluticasone (FLONASE) 50 MCG/ACT nasal spray, Place 1 spray into both nostrils daily as needed for allergies. , Disp: , Rfl:    furosemide (LASIX) 20 MG tablet, Take one tablet by mouth twice a day for one week, then resume one tablet by mouth daily, Disp: 35 tablet, Rfl: 0   furosemide (LASIX) 20 MG tablet, As needed patient may take 20 MG Lasix PRN by mouth QD x 4 days as directed per Alleviate Research HF Study PRN plan. PLEASE DO NOT REMOVE. PT IS IN RESEARCH STUDY., Disp: 90 tablet, Rfl: 3   gabapentin (NEURONTIN) 300 MG capsule, TAKE 2 CAPSULES BY MOUTH AT BEDTIME, Disp: 180 capsule, Rfl: 1   glucose blood test strip, Use to check CBG AC and QHS., Disp: 200 each, Rfl: 12   KLOR-CON M20 20 MEQ tablet, TAKE 1 TABLET BY MOUTH EVERY DAY, Disp: 90 tablet, Rfl: 1   losartan (COZAAR) 25 MG tablet, TAKE 1 TABLET BY MOUTH TWICE A DAY, Disp: 180 tablet, Rfl: 2   metFORMIN (GLUCOPHAGE) 500 MG tablet, Take 500 mg by mouth daily., Disp: , Rfl:    nebivolol (BYSTOLIC) 2.5 MG  tablet, TAKE 1 TABLET BY MOUTH EVERY DAY, Disp: 90 tablet, Rfl: 3   OneTouch Delica Lancets 85Y MISC, 1 each by Does not apply route daily., Disp: 100 each, Rfl: 1   pantoprazole (PROTONIX) 40 MG tablet, TAKE 1 TABLET BY MOUTH EVERY DAY, Disp: 90 tablet, Rfl: 3   potassium chloride (KLOR-CON) 10 MEQ tablet, As needed patient may take 10 mEq PRN by mouth QD x 4 days as directed per Alleviate Research HF Study PRN plan. PLEASE DO NOT REMOVE, Disp: 90 tablet, Rfl: 3   spironolactone (ALDACTONE) 25 MG tablet, Take 25 mg by mouth daily. Take 1/2 tablet on the days that you are taking 2 of the furosemide tablets., Disp: , Rfl:    traMADol (ULTRAM) 50 MG tablet, Take 50 mg by mouth as needed for moderate pain., Disp: , Rfl:    XARELTO 20 MG TABS tablet, TAKE 1 TABLET BY MOUTH EVERY DAY WITH SUPPER, Disp: 90 tablet, Rfl: 1

## 2022-05-11 NOTE — Patient Instructions (Addendum)
ICD-10-CM   1. Shortness of breath  R06.02   2. Chronic systolic dysfunction of left ventricle  I51.9   3. Obesity (BMI 30-39.9)  E66.9     Multifactorial shortness of breath including diastolic dysfunction, atrial fibrillation and weight and physical deconditioning.  Glad you are somewhat better   Plan -   Do spirometry and DLCO in 9 months - CT scan of the chest only if any change clinically  Followup -9 months or sooner if needed

## 2022-05-11 NOTE — Progress Notes (Signed)
Patient ID: FARDOWSA AUTHIER, female    DOB: 29-May-1946, 76 y.o.   MRN: [id number]  PCP Patient, No Pcp Per   HPI IOV 09/20/2018  Chief Complaint  Patient presents with   Consult    referred by [name] MD for abnormal PFTs. pt notes some dyspnea with exertion that is at baseline.   76 year old obese lady with chronic systolic heart failure EF < 35% (aug 2019(, < 25 in may 2019, march 2015) and atrial fibrillation on chronic amiodarone therapy 200 mg a day for the last several years according to history.  She tells me that she has insidious onset of shortness of breath for the last few years.  It has been so gradual in onset she does not remember when but she believes it is a few years.  Definitely not several years.  She feels its insidious worsening of shortness of breath.  Initially she denied any associated cough but then later she said for the last several months has had some cough occasional with some yellow sputum.  But definitely her shortness of breath is progressive very mildly and present on exertion relieved by rest no associated chest pain.  No orthopnea proximal nocturnal dyspnea or worsening edema or change in weight.  No fever or chills.  She had pulmonary function test that showed isolated reduction in diffusion capacity.  I personally visualized the scan.  Rest of lab work-up as mentioned below.  Of note, in the last 3 weeks she has had acute worsening of chronic back pain but she believes this is unrelated to her dyspnea.   Exam nitric oxide today in the office: 09/20/2018 - 21 ppb and normal  She has had 2 CT scans of the chest 1 in 2014 December that I personally visualized and shows diffuse groundglass opacities.  She has another CT scan in #2018 there is more recent that I personally visualized and this shows essentially clear lung fields except scattered lung nodules.  She had a cardiac stress test June 2019 that is reported as low risk  Echocardiogram  August 2019 with chronic systolic heart failure with EF 35% and grade 1 diastolic dysfunction  Blood work October 2019 with a creatinine 1.42 mg percent and a low GFR of 36 and a hemoglobin of 12.5 g% that is all stable in March 2019   OV 11/02/2018  Subjective:  Patient ID: [name], female , DOB: 07-20-1946 , age 68 y.o. , MRN: [id number] , ADDRESS: [address] [place] [facility] [address]   11/02/2018 -   Chief Complaint  Patient presents with   Follow-up    Pt states she has been doing good since last visit. States her breathing is about the same. Has an occ cough from allergies.     HPI LOURA PITT 76 y.o. -has history of amiodarone therapy, radiation therapy and previous smoking.  She was referred to [place] for abnormal pulmonary function test low DLCO.  We did a high-resolution CT chest and this showed possibility of early ILD [indeterminate pattern for UIP which means less than 40% chance that this is indeed pathological UIP/IPF].  We then subjected autoimmune vasculitis panel early January 2020 and this is returned normal.  She is here to discuss all the test results.   [place] Integrated Comprehensive ILD Questionnaire  Symptoms: She is very categorical that she has had chronic systolic heart failure for many years and ejection fraction recently has improved but the shortness of breath  is only present for the last several months.  Insidious onset and gradually getting worse in the face of improving ejection fraction.  Currently has level 5 dyspnea for walking up stairs and walking up a hill.  And level for dyspnea for walking on level at her own pace.  At rest she has level 1 dyspnea and for ADLs she has level 2 dyspnea.  She denies any cough or wheezing or any other symptoms   Past Medical History : She has chronic systolic heart failure.  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  Denies any asthma or autoimmune or vasculitis.  Denies sleep apnea.   Denies any thyroid disease stroke or seizures.  Denies hepatitis.   ROS: Positive for fatigue but no arthralgia.  Over the last several months she is lost 25 pounds.  No nausea vomiting or heartburn or rash or ulcers   FAMILY HISTORY of LUNG DISEASE: No family for pulmonary fibrosis but dad and sister had COPD   EXPOSURE HISTORY: She did smoke from 1968 2000 to 1-1/2 packs a day.  She is lived in the same house.  Does not vape or use marijuana or cocaine.   HOME and HOBBY DETAILS : [place].  Age of the home is 48 years.  Over a year ago with the rains she had to like extensive water standing under the house but there is no mold exposure.  Does not use CPAP.  Does not use neb machine.  Does use a steam iron but does not have a mold.  No fountain inside the house no pet birds.  No use of feather blankets or pillows.  No mold in the [facility] duct.  Does not play any wind instruments.  Does do gardening but she uses potted soil.  The soil is never damp.  No damp exposure with gardening   OCCUPATIONAL HISTORY (122 questions) : Positive for gardening but no mold exposure.  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  In the 1970s and 1980s worked at  [employer] and was exposed to [employer].  She is worked in a dusty environment with furniture for 20 years   Gann (27 items): Got radiation therapy to the breast between 2002 and 2003.  Got cancer chemotherapy at that time.  Has been on amiodarone now for the last few years        Results for SHATAVIA, SANTOR (MRN [id number]) as of 09/20/2018 12:08  Ref. Range 08/24/2018 08:49  FVC-Pre Latest Units: L 2.77  FVC-%Pred-Pre Latest Units: % 87  FEV1-Pre Latest Units: L 2.28  FEV1-%Pred-Pre Latest Units: % 95  Pre FEV1/FVC ratio Latest Units: % 83  FEV1FVC-%Pred-Pre Latest Units: % 109   Results for CHISOM, MUNTEAN (MRN [id number]) as of 09/20/2018 12:08   Ref. Range 08/24/2018 08:49  TLC Latest Units: L 4.56  TLC % pred Latest Units: % 85   Results for RUARI, DUGGAN (MRN [id number]) as of 09/20/2018 12:08  Ref. Range 08/24/2018 08:49  DLCO unc Latest Units: ml/min/mmHg 15.65  DLCO unc % pred Latest Units: % 58    Simple office walk 250 feet x  3 laps goal with forehead probe 11/02/2018   O2 used Room air  Number laps completed 2 and stopped due to dyspnea, and back pain  Comments about pace nomral  Resting Pulse Ox/HR 99% and 58/min  Final Pulse  Ox/HR 98% and 93/min  Desaturated </= 88% no  Desaturated <= 3% points no  Got Tachycardic >/= 90/min yes  Symptoms at end of test dyspne and back pain  Miscellaneous comments Stopped at 2 laps       CT chest high resolution 1210/19 IMPRESSION: 1. Extremely subtle ground-glass attenuation noted throughout the lungs bilaterally. This is nonspecific. The possibility of early interstitial lung disease is not entirely excluded. If there's persistent clinical concern for interstitial lung disease, a repeat high-resolution chest CT would be recommended in 12 months to assess for temporal changes in the appearance of the lung parenchyma. 2. No imaging stigmata of amiodarone induced pulmonary toxicity. 3. Mild air trapping, indicative of mild small airways disease. 4. Aortic atherosclerosis, in addition to left main and 3 vessel coronary artery disease. Assessment for potential risk factor modification, dietary therapy or pharmacologic therapy may be warranted, if clinically indicated. 5. Splenomegaly. 6. New postoperative changes of left modified radical mastectomy with postoperative fluid collection associated with the left pectoralis musculature measuring 2.6 x 10.4 cm, likely a postoperative seroma.   Aortic Atherosclerosis (ICD10-I70.0).     Electronically Signed   By: Vinnie Langton M.D.   On: 10/03/2018 20:23     Results for JANAISHA, TOLSMA (MRN [id number]) as of  11/02/2018 12:17  Ref. Range 10/26/2018 14:06 10/26/2018 14:15  ASPERGILLUS FUMIGATUS Latest Ref Range: NEGATIVE  NEGATIVE   Pigeon Serum Latest Ref Range: NEGATIVE  NEGATIVE   Anti Nuclear Antibody(ANA) Latest Ref Range: NEGATIVE  NEGATIVE   Angiotensin-Converting Enzyme Latest Ref Range: 9 - 67 U/L 75 (H)   Anti JO-1 Latest Ref Range: 0.0 - 0.9 AI <0.2   CENTROMERE AB SCREEN Latest Ref Range: <1.0 NEG AI <9.4 NEG   Cyclic Citrullin Peptide Ab Latest Units: UNITS <16   ds DNA Ab Latest Units: IU/mL 1   ENA RNP Ab Latest Ref Range: 0.0 - 0.9 AI <0.2   Myeloperoxidase Abs Latest Units: AI <1.0   Serine Protease 3 Latest Units: AI <1.0   RA Latex Turbid. Latest Ref Range: <14 IU/mL <14   ENA SM Ab Ser-aCnc Latest Ref Range: <1.0 NEG AI  <1.0 NEG  SSA (Ro) (ENA) Antibody, IgG Latest Ref Range: <1.0 NEG AI <1.0 NEG   SSB (La) (ENA) Antibody, IgG Latest Ref Range: <1.0 NEG AI <1.0 NEG   Scleroderma (Scl-70) (ENA) Antibody, IgG Latest Ref Range: <1.0 NEG AI <1.0 NEG     ROS - per HPI  OV 08/21/2020  Subjective:  Patient ID: [name], female , DOB: 1945-12-08 , age 30 y.o. , MRN: [id number] , ADDRESS: [address] [place] [address] PCP Isaac Bliss, Rayford Halsted, MD Patient Care Team: Isaac Bliss, Rayford Halsted, MD as PCP - General (Internal Medicine) Sueanne Margarita, MD as PCP - Cardiology (Cardiology) Audery Amel Sharma Covert, [facility] (Inactive) as Pharmacist (Pharmacist)  This Provider for this visit: Treatment Team:  Attending Provider: Brand Males, MD    08/21/2020 -   Chief Complaint  Patient presents with   Follow-up    shortness of breath with activity   follow-up dyspnea with concern for interstitial lung disease in January 2020   HPI REBIE PEALE 76 y.o. -  I personally not seen her since January 2020.  At the time ILD was a concern because of amiodarone therapy and radiation therapy.  However with follow-up and treatment of congestive heart failure and  stopping amiodarone her pulmonary function test improved by June 2020  it was normal.  December 2020 high-resolution CT did not show any pulmonary fibrosis.  She now returns for follow-up because she continues have persistent dyspnea with exertion relieved by rest.  She does not have any wheezing.  She says in the summer months it was worse but with the onset of fall it is better.  Early morning she has mucus congestion from postnasal drip but otherwise okay.  Overall she is stable.  She had echocardiogram in October 2021 and her ejection fraction has improved although still low.  She is on respiration recently chest x-ray is clear.  And per her report walking desaturation test in the office was normal.  There is no wheezing or cough orthopnea proximal nocturnal dyspnea edema.  There is no weight loss.  She is obese.    IMPRESSION: CT chest Dec 2020  1. No evidence of fibrotic interstitial lung disease. No evidence of amiodarone toxicity. 2. Air trapping is indicative of small airways disease. 3. Aortic atherosclerosis (ICD10-170.0). Coronary artery calcification. 4. Enlarged pulmonic trunk, indicative of pulmonary arterial hypertension.     Electronically Signed   By: Lorin Picket M.D.   On: 10/03/2019 15:00     Echo 07/30/20  IMPRESSIONS     1. Left ventricular ejection fraction, by estimation, is 45 to 50%. Left  ventricular ejection fraction by 3D volume is 45 %. The left ventricle has  mildly decreased function. The left ventricle has no regional wall motion  abnormalities. Left ventricular   diastolic parameters are consistent with Grade I diastolic dysfunction  (impaired relaxation). Elevated left atrial pressure.   2. Right ventricular systolic function is normal. The right ventricular  size is normal. Tricuspid regurgitation signal is inadequate for assessing  PA pressure.   3. A small pericardial effusion is present. The pericardial effusion is  circumferential. There  is no evidence of cardiac tamponade.   4. The mitral valve is grossly normal. Trivial mitral valve  regurgitation. No evidence of mitral stenosis.   5. The aortic valve is tricuspid. Aortic valve regurgitation is not  visualized. No aortic stenosis is present.   6. The inferior vena cava is normal in size with greater than 50%  respiratory variability, suggesting right atrial pressure of 3 mmHg.   Comparison(s): No significant change from prior study  OV 03/30/2021  Subjective:  Patient ID: [name], female , DOB: 07/08/1946 , age 45 y.o. , MRN: [id number] , ADDRESS: [address] [place] [address] PCP Isaac Bliss, Rayford Halsted, MD Patient Care Team: Isaac Bliss, Rayford Halsted, MD as PCP - General (Internal Medicine) Sueanne Margarita, MD as PCP - Cardiology (Cardiology) Viona Gilmore, [facility] as Pharmacist (Pharmacist)  This Provider for this visit: Treatment Team:  Attending Provider: Brand Males, MD    03/30/2021 -   Chief Complaint  Patient presents with   Follow-up    Pt states she has been doing okay since last visit. States her breathing is about the same as she still has increased SOB mainly with activities.     HPI RONNITA PAZ 76 y.o. -returns for monitoring of her shortness of breath.  Since last seeing me in the fall 2021 she has completed cardiac/pulmonary rehabilitation.  This was completed end of April 2022.  Her last 6-minute walk test was in February 10, 2021 and she did not desaturate.  She says overall her dyspnea is some better but is still present.  She also saw Dr. Radford Pax in October 2021 and ejection  fraction though low is noted to be improved.  She saw Dr. Rayann Heman her electrophysiologist for persistent A. fib.  He believes A. fib is well controlled post ablation.  This was end of April 2022.  She had some dizziness and he reduced her Lasix.  Hypertension was deemed stable.  Noticed that she was using dental appliance for OSA.  She had a  reassuring visit with Dr. Golden Hurter mid May 2022.  This was her cardiologist.  Also reassuring visit mid May 2022 with primary care.  Her BMI continues to be 38.4 and she is obese.  Her pulmonary function test shows sudden decline.  Although the decline itself is mild.  She is surprised by this.  She continues to be off amiodarone.     CT Chest data  No results found.  ADDENDUM REPORT: 04/29/2021 07:12   ADDENDUM: Findings appear similar to 10/03/2019, making subacute hypersensitivity pneumonitis less likely.     Electronically Signed   By: Lorin Picket M.D.   On: 04/29/2021 07:12    Addended by Lorin Picket, MD on 04/29/2021  7:14 AM   Study Result  Narrative & Impression  CLINICAL DATA:  Shortness of breath on exertion. Chronic shortness of breath.   EXAM: CT CHEST WITHOUT CONTRAST   TECHNIQUE: Multidetector CT imaging of the chest was performed following the standard protocol without intravenous contrast. High resolution imaging of the lungs, as well as inspiratory and expiratory imaging, was performed.   COMPARISON:  10/03/2019.   FINDINGS: Cardiovascular: Atherosclerotic calcification of the aorta and coronary arteries. Pulmonic trunk and heart are enlarged. No pericardial effusion.   Mediastinum/Nodes: No pathologically enlarged mediastinal or axillary lymph nodes. Surgical clips in the left axilla. Esophagus is grossly unremarkable.   Lungs/Pleura: Biapical pleuroparenchymal scarring. Upper lobe predominant ill-defined centrilobular nodularity and mosaic attenuation. Mild scarring in the lingula and left lower lobe. Negative for subpleural reticulation, traction bronchiectasis/bronchiolectasis, architectural distortion or honeycombing. There is air trapping. No pleural fluid. Airway is unremarkable.   Upper Abdomen: Visualized portions of the liver, adrenal glands, kidneys, spleen, pancreas, stomach and bowel are grossly unremarkable.  Cholecystectomy. No upper abdominal adenopathy.   Musculoskeletal: Degenerative changes in the spine. No worrisome lytic or sclerotic lesions. Lenticular fluid collection in the left anterior chest wall, status post mastectomy, has decreased in size, now measuring 2.4 x 8.4 cm, previously 10.4 x 30.5 cm.   IMPRESSION: 1. Upper lobe predominant ill-defined centrilobular nodularity, mosaic attenuation and air trapping, findings indicative of subacute hypersensitivity pneumonitis. Findings are suggestive of an alternative diagnosis (not UIP) per consensus guidelines: Diagnosis of Idiopathic Pulmonary Fibrosis: An Official ATS/ERS/JRS/ALAT Clinical Practice Guideline. [place], Iss 5, [phone], Jun 25 2017. 2. Decreased size of fluid collection in the left anterior chest wall. 3. Aortic atherosclerosis (ICD10-I70.0). Coronary artery calcification. 4. Enlarged pulmonic trunk, indicative of pulmonary arterial hypertension.   Electronically Signed: By: Lorin Picket M.D. On: 04/07/2021 14:22       OV 05/11/2022  Subjective:  Patient ID: [name], female , DOB: Mar 06, 1946 , age 53 y.o. , MRN: [id number] , ADDRESS: [address] [place] [facility] [id number] PCP Isaac Bliss, Rayford Halsted, MD Patient Care Team: Isaac Bliss, Rayford Halsted, MD as PCP - General (Internal Medicine) Sueanne Margarita, MD as PCP - Cardiology (Cardiology) Viona Gilmore, [facility] as Pharmacist (Pharmacist)  This Provider for this visit: Treatment Team:  Attending Provider: Brand Males, MD    05/11/2022 -   Chief  Complaint  Patient presents with   Follow-up    SOB follow up. Pt states that she has good days and bad days. She states the weather makes it worse. Pt states that when she does exert herself she does get SOB. No inhalers at this time. Pt states she does recover well after she does rest.    Multifactorial shortness of breath secondary to obesity, physical  deconditioning, chronic systolic dysfunction and possible upper lobe interstitial infiltrates  HPI REVER PICHETTE 76 y.o. -returns for follow-up.  I am seeing her after 1 year.  She continues to be stable.  She says that she gets short of breath with the heat with humidity and sometimes with the pollen.  Also with exertion such as doing household work but overall she feels her shortness of breath is somewhat better.  There is no wheezing or cough.  There is no albuterol use.  She had an echo earlier this year and shows chronic systolic dysfunction to be stable.  See report below.  She is on a heart monitor study at this point with cardiology.  There are no other new issues.  She had pulmonary function testing that shows continued stability at least in the last 2 or 3 years.  Systolic dysfunction since last visit in June 2022. ECHO march 20232  IMPRESSIONS     1. Left ventricular ejection fraction, by estimation, is 45 to 50%. The  left ventricle has mildly decreased function. The left ventricle  demonstrates global hypokinesis. Left ventricular diastolic parameters  were normal.   2. Right ventricular systolic function is normal. The right ventricular  size is normal. There is normal pulmonary artery systolic pressure.   3. Left atrial size was mildly dilated.   4. The mitral valve is normal in structure. Trivial mitral valve  regurgitation. No evidence of mitral stenosis.   5. The aortic valve is tricuspid. Aortic valve regurgitation is not  visualized. No aortic stenosis is present.   6. The inferior vena cava is normal in size with greater than 50%  respiratory variability, suggesting right atrial pressure of 3 mmHg.   Comparison(s): No significant change from prior study.    PFT     Latest Ref Rng & Units 05/11/2022    1:20 PM 02/23/2021    3:58 PM 04/18/2019    3:48 PM 08/24/2018    8:49 AM 12/05/2014    1:03 PM  PFT Results  FVC-Pre L 2.53  P 2.45  2.81  2.77  2.76    FVC-Predicted Pre % 83  P 79  89  87  81   FVC-Post L  2.60   2.71  2.72   FVC-Predicted Post %  84   85  80   Pre FEV1/FVC % % 81  P 78  84  83  85   Post FEV1/FCV % %  82   86  84   FEV1-Pre L 2.05  P 1.91  2.35  2.28  2.34   FEV1-Predicted Pre % 90  P 82  99  95  90   FEV1-Post L  2.13   2.32  2.30   DLCO uncorrected ml/min/mmHg 16.62  P 16.04  17.52  15.65  18.54   DLCO UNC% % 81  P 78  85  58  65   DLCO corrected ml/min/mmHg 16.62  P 16.04      DLCO COR %Predicted % 81  P 78      DLVA Predicted % 95  P 97  110  73  82   TLC L  4.59   4.56    TLC % Predicted %  85   85    RV % Predicted %  88   84      P Preliminary result       has a past medical history of Arthritis, Breast cancer, left breast ([place]) (2002), Breast cancer, left breast ([place]) (07/14/2017), Chronic back pain, Chronic systolic CHF (congestive heart failure) ([place]), Complication of anesthesia, COPD (chronic obstructive pulmonary disease) ([place]), DCM (dilated cardiomyopathy) ([place]), Dyspnea, Gallstones, GERD (gastroesophageal reflux disease), History of bronchitis, Hyperlipidemia, Hypertension, Joint pain, Muscle spasm, Nocturia, OSA (obstructive sleep apnea) (08/22/2018), Osteopenia, Persistent atrial fibrillation ([place]), Personal history of colonic polyps, Pneumonia, Seasonal allergies, Thyroid nodule, Type II diabetes mellitus ([place]), and Vitamin D deficiency.   reports that she quit smoking about 20 years ago. Her smoking use included cigarettes. She has a 52.50 pack-year smoking history. She has never used smokeless tobacco.  Past Surgical History:  Procedure Laterality Date   ATRIAL FIBRILLATION ABLATION N/A 12/12/2018   Procedure: ATRIAL FIBRILLATION ABLATION;  Surgeon: Thompson Grayer, MD;  Location: [facility] [place] [facility];  Service: Cardiovascular;  Laterality: N/A;   AXILLARY LYMPH NODE DISSECTION Left 10/2001   persistent intramammary node /notes 03/09/2011   BACK SURGERY     BREAST BIOPSY Left 2002   BREAST BIOPSY  Right 06/2017   "node bx was negative"   BREAST BIOPSY Left 06/2017   "positive for cancer"   BREAST LUMPECTOMY WITH RADIOACTIVE SEED LOCALIZATION Right 08/15/2017   Procedure: RIGHT BREAST LUMPECTOMY WITH RADIOACTIVE SEED LOCALIZATION;  Surgeon: Jovita Kussmaul, MD;  Location: [facility];  Service: General;  Laterality: Right;   CARDIAC CATHETERIZATION  2015   CARDIOVERSION N/A 11/12/2013   Procedure: CARDIOVERSION;  Surgeon: Thayer Headings, MD;  Location: [facility];  Service: Cardiovascular;  Laterality: N/A;  10:08  Dr. Marissa Nestle, anesthesia present, [place]   '60mg'$ ,  propofol '50mg'$ , IV for elective cardioversion....Dr. Cathie Olden delievered synch 120 joules with successful cardioversion to NSR   CARDIOVERSION N/A 11/12/2013   Procedure: CARDIOVERSION;  Surgeon: Thayer Headings, MD;  Location: [facility];  Service: Cardiovascular;  Laterality: N/A;   CARDIOVERSION N/A 11/14/2013   Procedure: CARDIOVERSION (BEDSIDE);  Surgeon: Larey Dresser, MD;  Location: [facility];  Service: Cardiovascular;  Laterality: N/A;   CARDIOVERSION N/A 02/22/2018   Procedure: CARDIOVERSION;  Surgeon: Sueanne Margarita, MD;  Location: [facility];  Service: Cardiovascular;  Laterality: N/A;   CATARACT EXTRACTION W/ INTRAOCULAR LENS  IMPLANT, BILATERAL Bilateral ~ 2016   COLONOSCOPY  2003, September 2008, April 01, 2011   adenomas 03 and 08, polyp 12, diverticulosis   COLONOSCOPY WITH PROPOFOL N/A 11/16/2016   Procedure: COLONOSCOPY WITH PROPOFOL;  Surgeon: Gatha Mayer, MD;  Location: [facility];  Service: Endoscopy;  Laterality: N/A;   implantable loop recorder placement  03/17/2022   [employer]tronic Reveal Linq model LNQ 11 (SN [id number]) implantable loop recorder implanted for Alleviate HF trial   JOINT REPLACEMENT     LAPAROSCOPIC CHOLECYSTECTOMY  2004   LEFT AND RIGHT HEART CATHETERIZATION WITH CORONARY ANGIOGRAM N/A 02/01/2014   Procedure: LEFT AND RIGHT HEART CATHETERIZATION WITH CORONARY ANGIOGRAM;  Surgeon: Sueanne Margarita, MD;  Location: [facility] CATH LAB;  Service: Cardiovascular;  Laterality: N/A;   LUMBAR LAMINECTOMY/DECOMPRESSION MICRODISCECTOMY N/A 03/19/2014   Procedure: LUMBAR LAMINECTOMY/DECOMPRESSION MICRODISCECTOMY 4 LEVEL;  Surgeon: Kristeen Miss, MD;  Location: [facility];  Service: Neurosurgery;  Laterality: N/A;  L1-2 L2-3 L3-4 L4-5 Laminectomy/Foraminotomy   MASS EXCISION Left 08/15/2017   Procedure: EXCISION OF LEFT BREAST MASS;  Surgeon: Jovita Kussmaul, MD;  Location: [facility];  Service: General;  Laterality: Left;   MASTECTOMY Left 10/27/2017   MASTECTOMY PARTIAL / LUMPECTOMY W/ AXILLARY LYMPHADENECTOMY  05/08/2001   Archie Endo 03/09/2011   PORT-A-CATH REMOVAL  2003   PORTA CATH INSERTION  2002   TEE WITHOUT CARDIOVERSION N/A 11/12/2013   Procedure: TRANSESOPHAGEAL ECHOCARDIOGRAM (TEE);  Surgeon: Thayer Headings, MD;  Location: [facility];  Service: Cardiovascular;  Laterality: N/A;  pt b/p low, pt buccal membranes very dry, lips scapped, pt c/o thirst. NPO since MN and iv FLUIDS TOTAL INFUSING AT TOTAL 20ML HR....  Dr. Cathie Olden order allow NS to bolus during procedure....very dry,NS bolus 250 ml total..pt responding well to meds.Marland Kitchen   TOTAL KNEE ARTHROPLASTY Left 2006   TOTAL KNEE ARTHROPLASTY Right 05/10/2016   Procedure: RIGHT TOTAL KNEE ARTHROPLASTY;  Surgeon: Gaynelle Arabian, MD;  Location: [facility];  Service: Orthopedics;  Laterality: Right;  With adductor block   TOTAL MASTECTOMY Left 10/27/2017   Procedure: LEFT MASTECTOMY;  Surgeon: Jovita Kussmaul, MD;  Location: [facility] OR;  Service: General;  Laterality: Left;   TUBAL LIGATION      Allergies  Allergen Reactions   Tikosyn [Dofetilide]     Prolonged QT/QTc   Zolpidem Other (See Comments)    Hallucinations, up walking around ? Hallucinations, up walking around   Codeine Other (See Comments)   Codeine Phosphate Nausea And Vomiting   Methylprednisolone Other (See Comments)    Felt really weird w the high dose oral steroid, but tolerates low doses  or oral steroid Felt really weird w the high dose oral steroid, but tolerates low doses or oral steroid    Immunization History  Administered Date(s) Administered   Fluad Quad(high Dose 65+) 07/18/2019, 08/11/2021   Influenza Split 09/29/2011, 07/20/2012   Influenza Whole 08/14/2008   Influenza, High Dose Seasonal PF 09/09/2017, 07/17/2020   Influenza,inj,Quad PF,6+ Mos 10/23/2013, 08/20/2014, 08/14/2015, 08/27/2016, 08/09/2018   PFIZER(Purple Top)SARS-COV-2 Vaccination 12/15/2019, 01/08/2020, 07/25/2020   Pneumococcal Conjugate-13 10/23/2013   Pneumococcal Polysaccharide-23 06/18/2011   Tdap 06/18/2011   Zoster, Live 07/20/2012    Family History  Problem Relation Age of Onset   Dementia Sister    Diabetes Sister    Alzheimer's disease Sister    Diabetes Brother        x 2   Heart disease Brother    Irritable bowel syndrome Brother    Liver cancer Mother    Diabetes Mother    Pancreatic cancer Mother    Diabetes Father    Heart disease Father      Current Outpatient Medications:    atorvastatin (LIPITOR) 40 MG tablet, TAKE 1 TABLET BY MOUTH EVERYDAY AT BEDTIME, Disp: 90 tablet, Rfl: 1   Cholecalciferol (VITAMIN D3) 2000 units capsule, Take 2,000 Units by mouth daily., Disp: , Rfl:    clotrimazole-betamethasone (LOTRISONE) cream, Apply 1 application topically 2 (two) times daily., Disp: 30 g, Rfl: 0   colchicine 0.6 MG tablet, TAKE 1 TABLET BY MOUTH 2 TIMES DAILY FOR 21 DAYS., Disp: 42 tablet, Rfl: 1   dapagliflozin propanediol (FARXIGA) 10 MG TABS tablet, Take 1 tablet (10 mg total) by mouth daily before breakfast., Disp: 90 tablet, Rfl: 1   fexofenadine (ALLEGRA) 180 MG tablet, Take 180 mg by mouth daily as needed for allergies or rhinitis., Disp: , Rfl:    fluconazole (  DIFLUCAN) 150 MG tablet, TAKE 1 TABLET BY MOUTH ONE TIME PER WEEK, Disp: 6 tablet, Rfl: 2   fluticasone (FLONASE) 50 MCG/ACT nasal spray, Place 1 spray into both nostrils daily as needed for allergies. ,  Disp: , Rfl:    furosemide (LASIX) 20 MG tablet, Take one tablet by mouth twice a day for one week, then resume one tablet by mouth daily, Disp: 35 tablet, Rfl: 0   furosemide (LASIX) 20 MG tablet, As needed patient may take 20 MG Lasix PRN by mouth QD x 4 days as directed per [employer] HF Study PRN plan. PLEASE DO NOT REMOVE. PT IS IN RESEARCH STUDY., Disp: 90 tablet, Rfl: 3   gabapentin (NEURONTIN) 300 MG capsule, TAKE 2 CAPSULES BY MOUTH AT BEDTIME, Disp: 180 capsule, Rfl: 1   glucose blood test strip, Use to check CBG AC and QHS., Disp: 200 each, Rfl: 12   KLOR-CON M20 20 MEQ tablet, TAKE 1 TABLET BY MOUTH EVERY DAY, Disp: 90 tablet, Rfl: 1   losartan (COZAAR) 25 MG tablet, TAKE 1 TABLET BY MOUTH TWICE A DAY, Disp: 180 tablet, Rfl: 2   metFORMIN (GLUCOPHAGE) 500 MG tablet, Take 500 mg by mouth daily., Disp: , Rfl:    nebivolol (BYSTOLIC) 2.5 MG tablet, TAKE 1 TABLET BY MOUTH EVERY DAY, Disp: 90 tablet, Rfl: 3   OneTouch Delica Lancets 16X MISC, 1 each by Does not apply route daily., Disp: 100 each, Rfl: 1   pantoprazole (PROTONIX) 40 MG tablet, TAKE 1 TABLET BY MOUTH EVERY DAY, Disp: 90 tablet, Rfl: 3   potassium chloride (KLOR-CON) 10 MEQ tablet, As needed patient may take 10 mEq PRN by mouth QD x 4 days as directed per [employer] HF Study PRN plan. PLEASE DO NOT REMOVE, Disp: 90 tablet, Rfl: 3   spironolactone (ALDACTONE) 25 MG tablet, Take 25 mg by mouth daily. Take 1/2 tablet on the days that you are taking 2 of the furosemide tablets., Disp: , Rfl:    traMADol (ULTRAM) 50 MG tablet, Take 50 mg by mouth as needed for moderate pain., Disp: , Rfl:    XARELTO 20 MG TABS tablet, TAKE 1 TABLET BY MOUTH EVERY DAY WITH SUPPER, Disp: 90 tablet, Rfl: 1      Objective:   Vitals:   05/11/22 1404  BP: 130/68  Pulse: 65  Temp: 98.2 F (36.8 C)  TempSrc: Oral  SpO2: 95%  Weight: 229 lb (103.9 kg)  Height: '5\' 6"'$  (1.676 m)    Estimated body mass index is 36.96 kg/m as  calculated from the following:   Height as of this encounter: '5\' 6"'$  (1.676 m).   Weight as of this encounter: 229 lb (103.9 kg).  '@WEIGHTCHANGE'$ @  [employer]   05/11/22 1404  Weight: 229 lb (103.9 kg)     Physical Exam   General: No distress. Obese. Looks well Neuro: Alert and Oriented x 3. GCS 15. Speech normal Psych: Pleasant Resp:  Barrel Chest - no.  Wheeze - no, Crackles - no, No overt respiratory distress CVS: Normal heart sounds. Murmurs - no Ext: Stigmata of Connective Tissue Disease - no HEENT: Normal upper airway. PEERL +. No post nasal drip        Assessment:       ICD-10-CM   1. Shortness of breath  R06.02     2. Chronic systolic dysfunction of left ventricle  I51.9     3. Obesity (BMI 30-39.9)  E66.9  Plan:     Patient Instructions     ICD-10-CM   1. Shortness of breath  R06.02   2. Chronic systolic dysfunction of left ventricle  I51.9   3. Obesity (BMI 30-39.9)  E66.9     Multifactorial shortness of breath including diastolic dysfunction, atrial fibrillation and weight and physical deconditioning.  Glad you are somewhat better   Plan -   Do spirometry and DLCO in 9 months - CT scan of the chest only if any change clinically  Followup -9 months or sooner if needed    SIGNATURE    Dr. Brand Males, M.D., F.C.C.P,  Pulmonary and Critical Care Medicine Staff Physician, [place] Director - Interstitial Lung Disease  Program  Pulmonary [place] at [place], [place], [address]  Pager: [phone], If no answer or between  15:00h - 7:00h: call [phone] Telephone: [phone]  2:31 PM 05/11/2022

## 2022-05-13 ENCOUNTER — Other Ambulatory Visit: Payer: Self-pay | Admitting: Internal Medicine

## 2022-05-13 ENCOUNTER — Telehealth: Payer: Self-pay | Admitting: Pharmacist

## 2022-05-13 ENCOUNTER — Telehealth: Payer: Self-pay | Admitting: Internal Medicine

## 2022-05-13 DIAGNOSIS — Z006 Encounter for examination for normal comparison and control in clinical research program: Secondary | ICD-10-CM

## 2022-05-13 DIAGNOSIS — M62838 Other muscle spasm: Secondary | ICD-10-CM

## 2022-05-13 MED ORDER — BLOOD GLUCOSE MONITOR KIT: PACK | 0 refills | Status: AC

## 2022-05-13 MED ORDER — CYCLOBENZAPRINE HCL 5 MG PO TABS: 5.0000 mg | ORAL_TABLET | Freq: Every evening | ORAL | 0 refills | Status: DC | PRN

## 2022-05-13 NOTE — Telephone Encounter (Signed)
Patient is aware 

## 2022-05-13 NOTE — Chronic Care Management (AMB) (Signed)
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 Referral to GI. Increased Jardiance to 25 mg daily. Follow up in 3 months.   Recent consult visits:  05/11/2022 Brand Males MD (pulmonary) - Patient was seen for shortness of breath and additional issues.  No medication changes. Follow up in 9 months.   03/23/2022 Lanae Crumbly DPM (podiatry) - Patient was seen for tinea pedis of right foot and an additional issue. No medication changes. No follow up noted.   03/17/2022 Thompson Grayer MD (cardiology) - Patient was seen for Chronic diastolic heart failure and additional issues. No medication changes. Follow up in 3 months.   Hospital visits:  None  Medications: Outpatient Encounter Medications as of 05/13/2022  Medication Sig   atorvastatin (LIPITOR) 40 MG tablet TAKE 1 TABLET BY MOUTH EVERYDAY AT BEDTIME   Cholecalciferol (VITAMIN D3) 2000 units capsule Take 2,000 Units by mouth daily.   clotrimazole-betamethasone (LOTRISONE) cream Apply 1 application topically 2 (two) times daily.   colchicine 0.6 MG tablet TAKE 1 TABLET BY MOUTH 2 TIMES DAILY FOR 21 DAYS.   dapagliflozin propanediol (FARXIGA) 10 MG TABS tablet Take 1 tablet (10 mg total) by mouth daily before breakfast.   fexofenadine (ALLEGRA) 180 MG tablet Take 180 mg by mouth daily as needed for allergies or rhinitis.   fluconazole (DIFLUCAN) 150 MG tablet TAKE 1 TABLET BY MOUTH ONE TIME PER WEEK   fluticasone (FLONASE) 50 MCG/ACT nasal spray Place 1 spray into both nostrils daily as needed for allergies.    furosemide (LASIX) 20 MG tablet Take one tablet by mouth twice a day for one week, then  resume one tablet by mouth daily   furosemide (LASIX) 20 MG tablet As needed patient may take 20 MG Lasix PRN by mouth QD x 4 days as directed per [employer] HF Study PRN plan. PLEASE DO NOT REMOVE. PT IS IN RESEARCH STUDY.   gabapentin (NEURONTIN) 300 MG capsule TAKE 2 CAPSULES BY MOUTH AT BEDTIME   glucose blood test strip Use to check CBG AC and QHS.   KLOR-CON M20 20 MEQ tablet TAKE 1 TABLET BY MOUTH EVERY DAY   losartan (COZAAR) 25 MG tablet TAKE 1 TABLET BY MOUTH TWICE A DAY   metFORMIN (GLUCOPHAGE) 500 MG tablet Take 500 mg by mouth daily.   nebivolol (BYSTOLIC) 2.5 MG tablet TAKE 1 TABLET BY MOUTH EVERY DAY   OneTouch Delica Lancets 40J MISC 1 each by Does not apply route daily.   pantoprazole (PROTONIX) 40 MG tablet TAKE 1 TABLET BY MOUTH EVERY DAY   potassium chloride (KLOR-CON) 10 MEQ tablet As needed patient may take 10 mEq PRN by mouth QD x 4 days as directed per [employer] HF Study PRN plan. PLEASE DO NOT REMOVE   spironolactone (ALDACTONE) 25 MG tablet Take 25 mg by mouth daily. Take 1/2 tablet on the days that you are taking 2 of the furosemide tablets.   traMADol (ULTRAM) 50 MG tablet Take 50 mg by mouth as needed for moderate pain.   XARELTO 20 MG TABS tablet TAKE 1 TABLET BY MOUTH EVERY DAY WITH SUPPER   No facility-administered encounter medications on file as of 05/13/2022.  Fill  History: ATORVASTATIN 40 MG TABLET 02/24/2022 90   COLCHICINE 0.6 MG TABLET 02/11/2022 21   FLUCONAZOLE 150 MG TABLET 03/23/2022 41   FUROSEMIDE  20 MG TABS 01/01/2022 30   GABAPENTIN 300 MG CAPSULE 02/24/2022 90   LOSARTAN POTASSIUM 25 MG TAB 04/20/2022 90   METFORMIN HCL 500 MG TABLET 04/22/2022 90   NEBIVOLOL 2.5 MG TABLET 02/24/2022 90   PANTOPRAZOLE SOD DR 40 MG TAB 03/03/2022 90   POTASSIUM CHLORIDE ER  10 MEQ TBCR 03/17/2022 90   XARELTO 20 MG TABLET 04/05/2022 90   SPIRONOLACTONE 25 MG TABLET 02/24/2022 90   Recent Relevant Labs: Lab Results  Component  Value Date/Time   HGBA1C 7.3 (A) 03/01/2022 03:03 PM   HGBA1C 6.9 (A) 11/11/2021 11:50 AM   HGBA1C 6.2 (H) 01/02/2018 02:51 PM   HGBA1C 6.2 (H) 10/27/2017 08:06 AM   MICROALBUR 1.2 02/03/2017 12:07 PM   MICROALBUR 0.7 11/06/2015 09:59 AM    Kidney Function Lab Results  Component Value Date/Time   CREATININE 1.06 (H) 03/17/2022 10:45 AM   CREATININE 1.41 (H) 02/10/2022 02:36 PM   CREATININE 1.06 (H) 08/19/2015 12:44 PM   CREATININE 1.46 (H) 04/05/2014 03:33 PM   GFR 62.33 02/03/2017 12:07 PM   GFRNONAA 42 (L) 07/10/2020 04:53 PM   GFRAA 49 (L) 07/10/2020 04:53 PM    Current antihyperglycemic regimen:  Farxiga 10 mg daily Metformin 500 mg daily  What recent interventions/DTPs have been made to improve glycemic control: Jardiance discontinued and Farxia 10 mg qd started.   Have there been any recent hospitalizations or ED visits since last visit with CPP? No recent hospital visits  Patient denies hypoglycemic symptoms  Patient denies hyperglycemic symptoms  How often are you checking your blood sugar? Patient lost her blood sugar meter about 1 week ago, prior to that she was checking her blood sugars 2-3 times per week.   What are your blood sugars ranging?  Patient states her readings are both fasting and non fasting. She states her blood sugar readings are always between 160-180.   During the week, how often does your blood glucose drop below 70? Patient states she never has any readings below 70  Are you checking your feet daily/regularly? Patient states she checks her feet daily.   Adherence Review: Is the patient currently on a STATIN medication? Yes Is the patient currently on ACE/ARB medication? Yes Does the patient have >5 day gap between last estimated fill dates? No  Care Gaps: AWV - previous message sent to Ramond Craver Last BP - 130/68 on 05/11/2022 Last A1C - 7.3 on 03/01/2022 Hep C Screen - never done Foot Exam - overdue Covid booster - overdue Shingrix  - postponed Tdap - postponed Colonoscopy - postponed  Star Rating Drugs: Atorvastatin 40 mg - last filled 02/24/2022 90 [facility] Farxiga 10 mg - no fill history Losartan 25 mg - last filled 04/20/2022 90 [facility] at [facility] Metformin 500 mg - last filled 04/22/2022 90 DS at [place] Pharmacist Assistant [phone]

## 2022-05-13 NOTE — Telephone Encounter (Signed)
pulled muscle in back x 3d picked up a case of water in the grocery store. Offered OV, declined stating she has an appointment coming up. Requesting a prescription for the pain. Patient also requesting a replacement blood glucose monitor (possibly one touch to coincide with the test strips she receives), she does not remember what type, she left hers behind while on vacation.

## 2022-05-13 NOTE — Research (Cosign Needed Addendum)
Alleviate HF Research Study  Investigator Action   Medications reviewed and action taken:   -[]  Medication changes made                       -[x]  No medication change     **If no medication changes , specify rationale**                       -[]  PRN medication intervention changes

## 2022-05-20 LAB — PULMONARY FUNCTION TEST
DL/VA % pred: 95 %
DL/VA: 3.87 ml/min/mmHg/L
DLCO cor % pred: 81 %
DLCO cor: 16.62 ml/min/mmHg
DLCO unc % pred: 81 %
DLCO unc: 16.62 ml/min/mmHg
FEF 25-75 Pre: 2.34 L/sec
FEF2575-%Pred-Pre: 135 %
FEV1-%Pred-Pre: 90 %
FEV1-Pre: 2.05 L
FEV1FVC-%Pred-Pre: 108 %
FEV6-%Pred-Pre: 87 %
FEV6-Pre: 2.52 L
FEV6FVC-%Pred-Pre: 104 %
FVC-%Pred-Pre: 83 %
FVC-Pre: 2.53 L
Pre FEV1/FVC ratio: 81 %
Pre FEV6/FVC Ratio: 100 %

## 2022-05-25 ENCOUNTER — Ambulatory Visit (INDEPENDENT_AMBULATORY_CARE_PROVIDER_SITE_OTHER): Payer: Medicare Other | Admitting: Internal Medicine

## 2022-05-25 VITALS — BP 110/52 | HR 65 | Temp 97.8°F | Wt 228.3 lb

## 2022-05-25 DIAGNOSIS — I70209 Unspecified atherosclerosis of native arteries of extremities, unspecified extremity: Secondary | ICD-10-CM

## 2022-05-25 DIAGNOSIS — M549 Dorsalgia, unspecified: Secondary | ICD-10-CM

## 2022-05-25 DIAGNOSIS — E1151 Type 2 diabetes mellitus with diabetic peripheral angiopathy without gangrene: Secondary | ICD-10-CM

## 2022-05-25 LAB — POCT GLYCOSYLATED HEMOGLOBIN (HGB A1C): Hemoglobin A1C: 6.6 % — AB (ref 4.0–5.6)

## 2022-05-25 NOTE — Progress Notes (Signed)
Established Patient Office Visit     CC/Reason for Visit: Acute torso pain  HPI: Cynthia Herrera is a 76 y.o. female who is coming in today for the above mentioned reasons.  About 3 weeks ago she developed acute pain of her left lower side immediately after bending down to pick up a 24 case of water at the grocery store.  She has tried muscle relaxants and ibuprofen without relief.  She is also due for an A1c recheck.  Past Medical/Surgical History: Past Medical History:  Diagnosis Date   Arthritis    Breast cancer, left breast ([place]) 2002   "then treated w/chemo and radiation"   Breast cancer, left breast ([place]) 07/14/2017   "tx'd w/mastectomy"   Chronic back pain    h/o lumbar stenosis; "no problem since my OR" (`10/27/2017)   Chronic systolic CHF (congestive heart failure) (HCC)    Complication of anesthesia    last surgery iv med when going to sleep"burned as injected"   COPD (chronic obstructive pulmonary disease) ([place])    no inhalers    DCM (dilated cardiomyopathy) ([place])    EF 15-20% ? tachycardia induced - EF 35-40% by echo 05/2018   Dyspnea    with exertion   Gallstones    GERD (gastroesophageal reflux disease)    once in a while;depends on what she eats   History of bronchitis    "chronic when I smoked; no problem since I quit in 2002" (10/27/2017)   Hyperlipidemia    Hypertension    takes Losartan and Metoprolol.    Joint pain    Muscle spasm    takes Flexeril daily as needed    Nocturia    OSA (obstructive sleep apnea) 08/22/2018   Severe obstructive sleep apnea with an AHI 47.3/h and no significant central sleep apnea with moderate oxygen desaturations as low as 79%   Osteopenia    Persistent atrial fibrillation (HCC)    s/p TEE DCCV and repeat DCCV 11/14/2013 and 02/2018   Personal history of colonic polyps    adenomas 03 and 08   Pneumonia    hx   Seasonal allergies    Thyroid nodule    Type II diabetes mellitus ([place])    Vitamin D deficiency     takes Vit D    Past Surgical History:  Procedure Laterality Date   ATRIAL FIBRILLATION ABLATION N/A 12/12/2018   Procedure: ATRIAL FIBRILLATION ABLATION;  Surgeon: Thompson Grayer, MD;  Location: [facility];  Service: Cardiovascular;  Laterality: N/A;   AXILLARY LYMPH NODE DISSECTION Left 10/2001   persistent intramammary node /notes 03/09/2011   BACK SURGERY     BREAST BIOPSY Left 2002   BREAST BIOPSY Right 06/2017   "node bx was negative"   BREAST BIOPSY Left 06/2017   "positive for cancer"   BREAST LUMPECTOMY WITH RADIOACTIVE SEED LOCALIZATION Right 08/15/2017   Procedure: RIGHT BREAST LUMPECTOMY WITH RADIOACTIVE SEED LOCALIZATION;  Surgeon: Jovita Kussmaul, MD;  Location: [facility];  Service: General;  Laterality: Right;   CARDIAC CATHETERIZATION  2015   CARDIOVERSION N/A 11/12/2013   Procedure: CARDIOVERSION;  Surgeon: Thayer Headings, MD;  Location: [facility];  Service: Cardiovascular;  Laterality: N/A;  10:08  Dr. Marissa Nestle, anesthesia present, [place]   '60mg'$ ,  propofol '50mg'$ , IV for elective cardioversion....Dr. Cathie Olden delievered synch 120 joules with successful cardioversion to NSR   CARDIOVERSION N/A 11/12/2013   Procedure: CARDIOVERSION;  Surgeon: Thayer Headings, MD;  Location: [facility];  Service: Cardiovascular;  Laterality: N/A;   CARDIOVERSION N/A 11/14/2013   Procedure: CARDIOVERSION (BEDSIDE);  Surgeon: Larey Dresser, MD;  Location: [facility];  Service: Cardiovascular;  Laterality: N/A;   CARDIOVERSION N/A 02/22/2018   Procedure: CARDIOVERSION;  Surgeon: Sueanne Margarita, MD;  Location: [facility];  Service: Cardiovascular;  Laterality: N/A;   CATARACT EXTRACTION W/ INTRAOCULAR LENS  IMPLANT, BILATERAL Bilateral ~ 2016   COLONOSCOPY  2003, September 2008, April 01, 2011   adenomas 03 and 08, polyp 12, diverticulosis   COLONOSCOPY WITH PROPOFOL N/A 11/16/2016   Procedure: COLONOSCOPY WITH PROPOFOL;  Surgeon: Gatha Mayer, MD;  Location: [facility];  Service:  Endoscopy;  Laterality: N/A;   implantable loop recorder placement  03/17/2022   Medtronic Reveal Linq model LNQ 11 (SN [id number]) implantable loop recorder implanted for [employer]lev[employer] HF trial   JOINT REPLACEMENT     LAPAROSCOPIC CHOLECYSTECTOMY  2004   LEFT AND RIGHT HEART CATHETERIZATION WITH CORONARY ANGIOGRAM N/A 02/01/2014   Procedure: LEFT AND RIGHT HEART CATHETERIZATION WITH CORONARY ANGIOGRAM;  Surgeon: Sueanne Margarita, MD;  Location: [facility];  Service: Cardiovascular;  Laterality: N/A;   LUMBAR LAMINECTOMY/DECOMPRESSION MICRODISCECTOMY N/A 03/19/2014   Procedure: LUMBAR LAMINECTOMY/DECOMPRESSION MICRODISCECTOMY 4 LEVEL;  Surgeon: Kristeen Miss, MD;  Location: [facility];  Service: Neurosurgery;  Laterality: N/A;  L1-2 L2-3 L3-4 L4-5 Laminectomy/Foraminotomy   MASS EXCISION Left 08/15/2017   Procedure: EXCISION OF LEFT BREAST MASS;  Surgeon: Jovita Kussmaul, MD;  Location: [facility];  Service: General;  Laterality: Left;   MASTECTOMY Left 10/27/2017   MASTECTOMY PARTIAL / LUMPECTOMY W/ AXILLARY LYMPHADENECTOMY  05/08/2001   Archie Endo 03/09/2011   PORT-A-CATH REMOVAL  2003   PORTA CATH INSERTION  2002   TEE WITHOUT CARDIOVERSION N/A 11/12/2013   Procedure: TRANSESOPHAGEAL ECHOCARDIOGRAM (TEE);  Surgeon: Thayer Headings, MD;  Location: [facility];  Service: Cardiovascular;  Laterality: N/A;  pt b/p low, pt buccal membranes very dry, lips scapped, pt c/o thirst. NPO since MN and iv FLUIDS TOTAL INFUSING AT TOTAL 20ML HR....  Dr. Cathie Olden order allow NS to bolus during procedure....very dry,NS bolus 250 ml total..pt responding well to meds.Marland Kitchen   TOTAL KNEE ARTHROPLASTY Left 2006   TOTAL KNEE ARTHROPLASTY Right 05/10/2016   Procedure: RIGHT TOTAL KNEE ARTHROPLASTY;  Surgeon: Gaynelle Arabian, MD;  Location: [facility];  Service: Orthopedics;  Laterality: Right;  With adductor block   TOTAL MASTECTOMY Left 10/27/2017   Procedure: LEFT MASTECTOMY;  Surgeon: Jovita Kussmaul, MD;  Location: [facility];  Service:  General;  Laterality: Left;   TUBAL LIGATION      Social History:  reports that she quit smoking about 20 years ago. Her smoking use included cigarettes. She has a 52.50 pack-year smoking history. She has never used smokeless tobacco. She reports that she does not drink alcohol and does not use drugs.  Allergies: Allergies  Allergen Reactions   Tikosyn [Dofetilide]     Prolonged QT/QTc   Zolpidem Other (See Comments)    Hallucinations, up walking around ? Hallucinations, up walking around   Codeine Other (See Comments)   Codeine Phosphate Nausea And Vomiting   Methylprednisolone Other (See Comments)    Felt really weird w the high dose oral steroid, but tolerates low doses or oral steroid Felt really weird w the high dose oral steroid, but tolerates low doses or oral steroid    Family History:  Family History  Problem Relation Age of Onset   Dementia Sister    Diabetes  Sister    Alzheimer's disease Sister    Diabetes Brother        x 2   Heart disease Brother    Irritable bowel syndrome Brother    Liver cancer Mother    Diabetes Mother    Pancreatic cancer Mother    Diabetes Father    Heart disease Father      Current Outpatient Medications:    atorvastatin (LIPITOR) 40 MG tablet, TAKE 1 TABLET BY MOUTH EVERYDAY AT BEDTIME, Disp: 90 tablet, Rfl: 1   blood glucose meter kit and supplies KIT, Dispense based on patient and insurance preference. Use up to four times daily as directed., Disp: 1 each, Rfl: 0   Cholecalciferol (VITAMIN D3) 2000 units capsule, Take 2,000 Units by mouth daily., Disp: , Rfl:    clotrimazole-betamethasone (LOTRISONE) cream, Apply 1 application topically 2 (two) times daily., Disp: 30 g, Rfl: 0   colchicine 0.6 MG tablet, TAKE 1 TABLET BY MOUTH 2 TIMES DAILY FOR 21 DAYS., Disp: 42 tablet, Rfl: 1   cyclobenzaprine (FLEXERIL) 5 MG tablet, Take 1 tablet (5 mg total) by mouth at bedtime as needed for muscle spasms., Disp: 30 tablet, Rfl: 0    dapagliflozin propanediol (FARXIGA) 10 MG TABS tablet, Take 1 tablet (10 mg total) by mouth daily before breakfast., Disp: 90 tablet, Rfl: 1   fexofenadine (ALLEGRA) 180 MG tablet, Take 180 mg by mouth daily as needed for allergies or rhinitis., Disp: , Rfl:    fluconazole (DIFLUCAN) 150 MG tablet, TAKE 1 TABLET BY MOUTH ONE TIME PER WEEK, Disp: 6 tablet, Rfl: 2   fluticasone (FLONASE) 50 MCG/ACT nasal spray, Place 1 spray into both nostrils daily as needed for allergies. , Disp: , Rfl:    furosemide (LASIX) 20 MG tablet, Take one tablet by mouth twice a day for one week, then resume one tablet by mouth daily, Disp: 35 tablet, Rfl: 0   furosemide (LASIX) 20 MG tablet, As needed patient may take 20 MG Lasix PRN by mouth QD x 4 days as directed per [employer] HF Study PRN plan. PLEASE DO NOT REMOVE. PT IS IN RESEARCH STUDY., Disp: 90 tablet, Rfl: 3   gabapentin (NEURONTIN) 300 MG capsule, TAKE 2 CAPSULES BY MOUTH AT BEDTIME, Disp: 180 capsule, Rfl: 1   glucose blood test strip, Use to check CBG AC and QHS., Disp: 200 each, Rfl: 12   KLOR-CON M20 20 MEQ tablet, TAKE 1 TABLET BY MOUTH EVERY DAY, Disp: 90 tablet, Rfl: 1   losartan (COZAAR) 25 MG tablet, TAKE 1 TABLET BY MOUTH TWICE A DAY, Disp: 180 tablet, Rfl: 2   metFORMIN (GLUCOPHAGE) 500 MG tablet, Take 500 mg by mouth daily., Disp: , Rfl:    nebivolol (BYSTOLIC) 2.5 MG tablet, TAKE 1 TABLET BY MOUTH EVERY DAY, Disp: 90 tablet, Rfl: 3   OneTouch Delica Lancets 14E MISC, 1 each by Does not apply route daily., Disp: 100 each, Rfl: 1   pantoprazole (PROTONIX) 40 MG tablet, TAKE 1 TABLET BY MOUTH EVERY DAY, Disp: 90 tablet, Rfl: 3   potassium chloride (KLOR-CON) 10 MEQ tablet, As needed patient may take 10 mEq PRN by mouth QD x 4 days as directed per [employer] HF Study PRN plan. PLEASE DO NOT REMOVE, Disp: 90 tablet, Rfl: 3   spironolactone (ALDACTONE) 25 MG tablet, Take 25 mg by mouth daily. Take 1/2 tablet on the days that you are taking  2 of the furosemide tablets., Disp: , Rfl:    traMADol (  ULTRAM) 50 MG tablet, Take 50 mg by mouth as needed for moderate pain., Disp: , Rfl:    XARELTO 20 MG TABS tablet, TAKE 1 TABLET BY MOUTH EVERY DAY WITH SUPPER, Disp: 90 tablet, Rfl: 1  Review of Systems:  Constitutional: Denies fever, chills, diaphoresis, appetite change and fatigue.  HEENT: Denies photophobia, eye pain, redness, hearing loss, ear pain, congestion, sore throat, rhinorrhea, sneezing, mouth sores, trouble swallowing, neck pain, neck stiffness and tinnitus.   Respiratory: Denies SOB, DOE, cough, chest tightness,  and wheezing.   Cardiovascular: Denies chest pain, palpitations and leg swelling.  Gastrointestinal: Denies nausea, vomiting, abdominal pain, diarrhea, constipation, blood in stool and abdominal distention.  Genitourinary: Denies dysuria, urgency, frequency, hematuria, flank pain and difficulty urinating.  Endocrine: Denies: hot or cold intolerance, sweats, changes in hair or nails, polyuria, polydipsia. Musculoskeletal: Denies myalgias,  joint swelling, arthralgias. Skin: Denies pallor, rash and wound.  Neurological: Denies dizziness, seizures, syncope, weakness, light-headedness, numbness and headaches.  Hematological: Denies adenopathy. Easy bruising, personal or family bleeding history  Psychiatric/Behavioral: Denies suicidal ideation, mood changes, confusion, nervousness, sleep disturbance and agitation    Physical Exam: Vitals:   05/25/22 1358  BP: (!) 110/52  Pulse: 65  Temp: 97.8 F (36.6 C)  TempSrc: Oral  SpO2: 93%  Weight: 228 lb 4.8 oz (103.6 kg)    Body mass index is 36.85 kg/m.   Constitutional: NAD, calm, comfortable Eyes: PERRL, lids and conjunctivae normal ENMT: Mucous membranes are moist.  Psychiatric: Normal judgment and insight. Alert and oriented x 3. Normal mood.    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  Left-sided back pain, unspecified back location, unspecified chronicity  - Plan: Ambulatory referral to Physical Therapy, she will also try icing, continue with muscle relaxant and as needed NSAIDs.    Time spent:23 minutes reviewing chart, interviewing and examining patient and formulating plan of care.    Lelon Frohlich, MD [facility]

## 2022-05-27 ENCOUNTER — Ambulatory Visit: Payer: Medicare Other | Attending: Internal Medicine | Admitting: Physical Therapy

## 2022-05-27 ENCOUNTER — Other Ambulatory Visit: Payer: Self-pay

## 2022-05-27 ENCOUNTER — Encounter: Payer: Self-pay | Admitting: Physical Therapy

## 2022-05-27 DIAGNOSIS — M6281 Muscle weakness (generalized): Secondary | ICD-10-CM | POA: Diagnosis not present

## 2022-05-27 DIAGNOSIS — M549 Dorsalgia, unspecified: Secondary | ICD-10-CM | POA: Diagnosis not present

## 2022-05-27 DIAGNOSIS — M5459 Other low back pain: Secondary | ICD-10-CM

## 2022-05-27 NOTE — Therapy (Signed)
OUTPATIENT PHYSICAL THERAPY THORACOLUMBAR EVALUATION   Patient Name: Cynthia Herrera MRN: [id number] DOB:1946/04/01, 76 y.o., female Today's Date: 05/27/2022   PT End of Session - 05/27/22 1237     Visit Number 1    Date for PT Re-Evaluation 07/22/22    Authorization Type [employer] Medicare    PT Start Time 1233    PT Stop Time 6283    PT Time Calculation (min) 42 min    Activity Tolerance Patient tolerated treatment well             Past Medical History:  Diagnosis Date   Arthritis    Breast cancer, left breast ([place]) 2002   "then treated w/chemo and radiation"   Breast cancer, left breast ([place]) 07/14/2017   "tx'd w/mastectomy"   Chronic back pain    h/o lumbar stenosis; "no problem since my OR" (`10/27/2017)   Chronic systolic CHF (congestive heart failure) (HCC)    Complication of anesthesia    last surgery iv med when going to sleep"burned as injected"   COPD (chronic obstructive pulmonary disease) ([place])    no inhalers    DCM (dilated cardiomyopathy) ([place])    EF 15-20% ? tachycardia induced - EF 35-40% by echo 05/2018   Dyspnea    with exertion   Gallstones    GERD (gastroesophageal reflux disease)    once in a while;depends on what she eats   History of bronchitis    "chronic when I smoked; no problem since I quit in 2002" (10/27/2017)   Hyperlipidemia    Hypertension    takes Losartan and Metoprolol.    Joint pain    Muscle spasm    takes Flexeril daily as needed    Nocturia    OSA (obstructive sleep apnea) 08/22/2018   Severe obstructive sleep apnea with an AHI 47.3/h and no significant central sleep apnea with moderate oxygen desaturations as low as 79%   Osteopenia    Persistent atrial fibrillation (HCC)    s/p TEE DCCV and repeat DCCV 11/14/2013 and 02/2018   Personal history of colonic polyps    adenomas 03 and 08   Pneumonia    hx   Seasonal allergies    Thyroid nodule    Type II diabetes mellitus ([place])    Vitamin D deficiency    takes Vit D   Past  Surgical History:  Procedure Laterality Date   ATRIAL FIBRILLATION ABLATION N/A 12/12/2018   Procedure: ATRIAL FIBRILLATION ABLATION;  Surgeon: Thompson Grayer, MD;  Location: [facility];  Service: Cardiovascular;  Laterality: N/A;   AXILLARY LYMPH NODE DISSECTION Left 10/2001   persistent intramammary node /notes 03/09/2011   BACK SURGERY     BREAST BIOPSY Left 2002   BREAST BIOPSY Right 06/2017   "node bx was negative"   BREAST BIOPSY Left 06/2017   "positive for cancer"   BREAST LUMPECTOMY WITH RADIOACTIVE SEED LOCALIZATION Right 08/15/2017   Procedure: RIGHT BREAST LUMPECTOMY WITH RADIOACTIVE SEED LOCALIZATION;  Surgeon: Jovita Kussmaul, MD;  Location: [facility];  Service: General;  Laterality: Right;   CARDIAC CATHETERIZATION  2015   CARDIOVERSION N/A 11/12/2013   Procedure: CARDIOVERSION;  Surgeon: Thayer Headings, MD;  Location: [facility];  Service: Cardiovascular;  Laterality: N/A;  10:08  Dr. Marissa Nestle, anesthesia present, [place]   '60mg'$ ,  propofol '50mg'$ , IV for elective cardioversion....Dr. Cathie Olden delievered synch 120 joules with successful cardioversion to NSR   CARDIOVERSION N/A 11/12/2013   Procedure: CARDIOVERSION;  Surgeon: Wonda Cheng Nahser,  MD;  Location: [place];  Service: Cardiovascular;  Laterality: N/A;   CARDIOVERSION N/A 11/14/2013   Procedure: CARDIOVERSION (BEDSIDE);  Surgeon: Larey Dresser, MD;  Location: [place];  Service: Cardiovascular;  Laterality: N/A;   CARDIOVERSION N/A 02/22/2018   Procedure: CARDIOVERSION;  Surgeon: Sueanne Margarita, MD;  Location: [facility];  Service: Cardiovascular;  Laterality: N/A;   CATARACT EXTRACTION W/ INTRAOCULAR LENS  IMPLANT, BILATERAL Bilateral ~ 2016   COLONOSCOPY  2003, September 2008, April 01, 2011   adenomas 03 and 08, polyp 12, diverticulosis   COLONOSCOPY WITH PROPOFOL N/A 11/16/2016   Procedure: COLONOSCOPY WITH PROPOFOL;  Surgeon: Gatha Mayer, MD;  Location: [facility];  Service: Endoscopy;  Laterality: N/A;    implantable loop recorder placement  03/17/2022   [employer] Reveal Linq model LNQ 11 (SN [id number]) implantable loop recorder implanted for Alleviate HF trial   JOINT REPLACEMENT     LAPAROSCOPIC CHOLECYSTECTOMY  2004   LEFT AND RIGHT HEART CATHETERIZATION WITH CORONARY ANGIOGRAM N/A 02/01/2014   Procedure: LEFT AND RIGHT HEART CATHETERIZATION WITH CORONARY ANGIOGRAM;  Surgeon: Sueanne Margarita, MD;  Location: [facility];  Service: Cardiovascular;  Laterality: N/A;   LUMBAR LAMINECTOMY/DECOMPRESSION MICRODISCECTOMY N/A 03/19/2014   Procedure: LUMBAR LAMINECTOMY/DECOMPRESSION MICRODISCECTOMY 4 LEVEL;  Surgeon: Kristeen Miss, MD;  Location: [place] [facility];  Service: Neurosurgery;  Laterality: N/A;  L1-2 L2-3 L3-4 L4-5 Laminectomy/Foraminotomy   MASS EXCISION Left 08/15/2017   Procedure: EXCISION OF LEFT BREAST MASS;  Surgeon: Jovita Kussmaul, MD;  Location: [facility];  Service: General;  Laterality: Left;   MASTECTOMY Left 10/27/2017   MASTECTOMY PARTIAL / LUMPECTOMY W/ AXILLARY LYMPHADENECTOMY  05/08/2001   Archie Endo 03/09/2011   PORT-A-CATH REMOVAL  2003   PORTA CATH INSERTION  2002   TEE WITHOUT CARDIOVERSION N/A 11/12/2013   Procedure: TRANSESOPHAGEAL ECHOCARDIOGRAM (TEE);  Surgeon: Thayer Headings, MD;  Location: [place];  Service: Cardiovascular;  Laterality: N/A;  pt b/p low, pt buccal membranes very dry, lips scapped, pt c/o thirst. NPO since MN and iv FLUIDS TOTAL INFUSING AT TOTAL 20ML HR....  Dr. Cathie Olden order allow NS to bolus during procedure....very dry,NS bolus 250 ml total..pt responding well to meds.Marland Kitchen   TOTAL KNEE ARTHROPLASTY Left 2006   TOTAL KNEE ARTHROPLASTY Right 05/10/2016   Procedure: RIGHT TOTAL KNEE ARTHROPLASTY;  Surgeon: Gaynelle Arabian, MD;  Location: [facility];  Service: Orthopedics;  Laterality: Right;  With adductor block   TOTAL MASTECTOMY Left 10/27/2017   Procedure: LEFT MASTECTOMY;  Surgeon: Jovita Kussmaul, MD;  Location: [facility];  Service: General;  Laterality: Left;    TUBAL LIGATION     Patient Active Problem List   Diagnosis Date Noted   Shortness of breath 07/17/2020   Allergic rhinitis 07/17/2020   Healthcare maintenance 07/17/2020   Abnormal PFT 04/19/2019   OSA (obstructive sleep apnea) 08/22/2018   Trochanteric bursitis, right hip 07/14/2018   Visit for monitoring Tikosyn therapy 03/21/2018   Thyroid nodule 01/31/2018   Candidiasis of skin 01/05/2018   Menopausal syndrome 01/05/2018   Postmenopausal bleeding 01/05/2018   Postoperative breast asymmetry 12/02/2017   Recurrent breast cancer, left ([place]) 10/27/2017   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, estrogen receptor positive ([place]) 09/19/2017   History of left breast cancer 09/19/2017   Malignant neoplasm of central portion of left breast in female, estrogen receptor positive ([place]) 09/01/2017   Diabetes mellitus with peripheral vascular disease ([place]) 05/03/2016   Diabetes mellitus with coincident hypertension ([place]) 05/03/2016  Diabetes type 2 with atherosclerosis of arteries of extremities (HCC) 11/10/2015   Excessive daytime sleepiness 03/12/2015   Lumbar stenosis 03/19/2014   Persistent atrial fibrillation (HCC)    DCM (dilated cardiomyopathy) (HCC)    Chronic combined systolic and diastolic CHF (congestive heart failure) (HCC)    Chronic anticoagulation- Xarelto 11/16/2013   Family history of coronary artery disease 11/08/2013   Coronary artery calcification seen on CAT scan 11/08/2013   Spinal stenosis of lumbar region 12/18/2012   BACK PAIN 01/08/2010   Chronic back pain 02/28/2008   Osteopenia 05/17/2007   Dyslipidemia 05/03/2007   Essential hypertension 05/03/2007   COPD (chronic obstructive pulmonary disease) ([place]) 05/03/2007   BREAST CANCER, HX OF 05/03/2007   History of colonic polyps 05/03/2007    PCP: Isaac Bliss, Olam Idler MD  REFERRING PROVIDER: Isaac Bliss, Olam Idler MD  REFERRING DIAG: M54.9 left low back pain   Rationale for  Evaluation and Treatment Rehabilitation  THERAPY DIAG:  Low back pain; weakness  ONSET DATE: mid July  SUBJECTIVE:                                                                                                                                                                                           SUBJECTIVE STATEMENT: 2 weeks ago while at [employer], 24 bottle flat lowering into cart. Felt immediate left flank pain/rib pain; hurts to cough, take a deep breath and with sit to stand, bending over;  no imaging done  PERTINENT HISTORY:  History of back pain; spinal stenosis (had surgery); left mastectomy for CA 2019 lymph nodes removed; bil TKR; osteopenia; has an implant as part of an A-fib study; COPD; former smoker  PAIN:  Are you having pain? Yes NPRS scale: 4/10 , will increase to 8 or 10/10 Pain location: left under ribs  Aggravating factors: sit to stand, bending over, walking affects some, hurts to take deep breath and cough, lying on left side Relieving factors: compressing it with hand  PRECAUTIONS: None  WEIGHT BEARING RESTRICTIONS No  FALLS:  Has patient fallen in last 6 months? No  LIVING ENVIRONMENT:  lives with [place] dog "Mel Almond" Lives with: lives alone Lives in: House/apartment   OCCUPATION: retired   PLOF: Independent  PATIENT GOALS pain relief to get back to gardening/yardwork   OBJECTIVE:   DIAGNOSTIC FINDINGS:  none  PATIENT SURVEYS:  FOTO 36%   COGNITION:  Overall cognitive status: Within functional limits for tasks assessed     SENSATION: WFL; axillary numbness from mastectomy   PALPATION: No tenderness left intercostals  LUMBAR ROM:   Active  A/PROM  eval  Flexion Painful 50 degrees  Extension   Right lateral flexion Painful 20  Left lateral flexion More pain 15  Right rotation More painful 25  Left rotation Pain 30   (Blank rows = not tested)  UPPER EXTREMITY ROM:   WFLS some pain with endrange elevation and left shoulder  internal rotation  UPPER EXTREMITY MMT:  left shoulder flexion 4-/5, left shoulder abduction 4-/5 pain with resistance   MOBILITY:  Comments: antalgia with sit to stand    TODAY'S TREATMENT  Kinesiotape 2 I strips across lower ribs   PATIENT EDUCATION:  Education details: treatment plan Person educated: Patient Education method: Explanation Education comprehension: verbalized understanding   HOME EXERCISE PROGRAM: To be started next visit   ASSESSMENT:  CLINICAL IMPRESSION: Patient is a 76 y.o. female who was seen today for physical therapy evaluation and treatment for left lower flank/rib pain that started 2 weeks ago while lifting a flat of bottles at the grocery store.  Pain is aggravated with sit to stand, bending, coughing and taking a deep breath.   The patient would benefit from PT to address  range of motion deficits, strength asymmetries  and pain levels that are currently affecting activities of daily living at home  including sit to stand,  sleeping,  lifting and gardening/yardwork.    OBJECTIVE IMPAIRMENTS decreased activity tolerance, decreased ROM, decreased strength, and pain.   ACTIVITY LIMITATIONS lifting, bending, sleeping, transfers, and bed mobility  PARTICIPATION LIMITATIONS: meal prep, cleaning, laundry, shopping, and yard work  PERSONAL FACTORS 1-2 comorbidities: history of lumbar spinal surgery, breast CA, COPD  are also affecting patient's functional outcome.   REHAB POTENTIAL: Good  CLINICAL DECISION MAKING: Stable/uncomplicated  EVALUATION COMPLEXITY: Low   GOALS: Goals reviewed with patient? Yes  SHORT TERM GOALS: Target date: 06/24/2022  The patient will demonstrate knowledge of basic self care strategies and exercises to promote healing   Baseline: Goal status: INITIAL  2.  The patient will report a 40% improvement in pain levels with functional activities which are currently difficult including sit to stand, bending Baseline:  Goal  status: INITIAL  3.  The patient will have improved UE muscle strength to at least 4/5 needed for lifting medium weight objects such as grocery bags,  Baseline:  Goal status: INITIAL   LONG TERM GOALS: Target date: 07/22/2022  The patient will be independent in a safe self progression of a home exercise program to promote further recovery of function   Baseline:  Goal status: INITIAL  2.  The patient will report a 65% improvement in pain levels with functional activities which are currently difficult including sit to stand, bending, lying on left side Baseline:  Goal status: INITIAL  3.  The patient will have improved trunk and UE strength to at least 4+/5 needed for lifting medium weight objects such as grocery bags, laundry   Baseline:  Goal status: INITIAL  4.  Trunk rotation, sidebending and flexion ROM improved to [facility] and symmetrical with opposite side Baseline:  Goal status: INITIAL  5.  The patient will have improved FOTO score to    57%   indicating improved function with less pain  Baseline:  Goal status: INITIAL   PLAN: PT FREQUENCY: 1x/week  PT DURATION: 8 weeks  PLANNED INTERVENTIONS: Therapeutic exercises, Therapeutic activity, Neuromuscular re-education, Gait training, Patient/Family education, Self Care, Joint mobilization, Aquatic Therapy, Dry Needling, Electrical stimulation, Spinal mobilization, Cryotherapy, Moist heat, Traction, Ultrasound, Ionotophoresis '4mg'$ /ml Dexamethasone, Manual therapy, and Re-evaluation.  PLAN FOR NEXT SESSION: assess response to KT;  deep breathing; trunk lengthening and decompression type movements  Ruben Im, PT 05/27/22 7:52 PM Phone: [phone] Fax: [phone]

## 2022-05-31 ENCOUNTER — Other Ambulatory Visit: Payer: Self-pay | Admitting: Internal Medicine

## 2022-05-31 DIAGNOSIS — M549 Dorsalgia, unspecified: Secondary | ICD-10-CM

## 2022-05-31 DIAGNOSIS — Z006 Encounter for examination for normal comparison and control in clinical research program: Secondary | ICD-10-CM

## 2022-05-31 NOTE — Research (Addendum)
Alleviate HF Research Study   Investigator Action   Medications reviewed and action taken:   -'[x]'$  Medication changes made                       -'[]'$  No medication change     **If no medication changes , specify rationale**                       -'[]'$  PRN medication intervention changes

## 2022-06-01 ENCOUNTER — Ambulatory Visit: Payer: Medicare Other | Admitting: Internal Medicine

## 2022-06-02 ENCOUNTER — Ambulatory Visit (INDEPENDENT_AMBULATORY_CARE_PROVIDER_SITE_OTHER): Payer: Medicare Other

## 2022-06-02 ENCOUNTER — Other Ambulatory Visit: Payer: Medicare Other

## 2022-06-02 DIAGNOSIS — Z853 Personal history of malignant neoplasm of breast: Secondary | ICD-10-CM | POA: Diagnosis not present

## 2022-06-02 DIAGNOSIS — M549 Dorsalgia, unspecified: Secondary | ICD-10-CM

## 2022-06-02 DIAGNOSIS — R0789 Other chest pain: Secondary | ICD-10-CM | POA: Diagnosis not present

## 2022-06-02 DIAGNOSIS — I700 Atherosclerosis of aorta: Secondary | ICD-10-CM | POA: Diagnosis not present

## 2022-06-07 ENCOUNTER — Other Ambulatory Visit: Payer: Self-pay | Admitting: *Deleted

## 2022-06-07 MED ORDER — FUROSEMIDE 40 MG PO TABS: ORAL_TABLET | ORAL | 3 refills | Status: DC

## 2022-06-07 NOTE — Progress Notes (Addendum)
Increase in lasix dose per Dr Rayann Heman due to high alerts with Alleviate study.  Change:  Lasix 40 mg Daily   NO change:  Potassium 20 mg daily will stay the same.   PRN orders will stay the same

## 2022-06-08 ENCOUNTER — Other Ambulatory Visit: Payer: Self-pay | Admitting: *Deleted

## 2022-06-08 MED ORDER — FUROSEMIDE 40 MG PO TABS: ORAL_TABLET | ORAL | 3 refills | Status: AC

## 2022-06-08 NOTE — Progress Notes (Signed)
Updated the how to take   Lasix '40mg'$  daily  Take one tablet by mouth daily

## 2022-06-10 ENCOUNTER — Ambulatory Visit: Payer: Medicare Other | Admitting: Physical Therapy

## 2022-06-17 ENCOUNTER — Encounter: Payer: Self-pay | Admitting: Physical Therapy

## 2022-06-17 ENCOUNTER — Ambulatory Visit: Payer: Medicare Other | Admitting: Physician Assistant

## 2022-06-17 DIAGNOSIS — C50912 Malignant neoplasm of unspecified site of left female breast: Secondary | ICD-10-CM | POA: Diagnosis not present

## 2022-06-17 DIAGNOSIS — Z170 Estrogen receptor positive status [ER+]: Secondary | ICD-10-CM | POA: Diagnosis not present

## 2022-06-24 DIAGNOSIS — Z006 Encounter for examination for normal comparison and control in clinical research program: Secondary | ICD-10-CM

## 2022-06-24 NOTE — Research (Signed)
Spoke with patient to clarify that her most recent prescription for Furosemide is 40 MG daily.

## 2022-06-29 DIAGNOSIS — Z006 Encounter for examination for normal comparison and control in clinical research program: Secondary | ICD-10-CM

## 2022-06-29 NOTE — Research (Signed)
[facility] upload for Alleviate HF Research Study.

## 2022-06-29 NOTE — Progress Notes (Signed)
Cardiology Office Note Date:  06/29/2022  Patient ID:  Cynthia, Herrera 22-Apr-1946, MRN [id number] PCP:  Isaac Bliss, Rayford Halsted, MD  Cardiologist:  Dr. Radford Pax AHF: Dr. Haroldine Laws  Electrophysiologist: Dr. Rayann Heman    Chief Complaint:   3 mo  History of Present Illness: Cynthia Herrera is a 76 y.o. female with history of non-obstructive CAD, NICM (? If tachy-mediated 2/2 AF), HTN, HLD, chronic CHF (systolic), CBP, COPD, ILD (?amio), GERD, breast cancer treated, DM, AFib, OSA w/dental appliance  She saw HF team once in 2020, her EF was improved from prior echo with maintenance of SR, noted low dase BB 2/2 bradycardia.  Encouraged to follow up with Dr. Ron Parker for dental appliance and improved OSA management.  I saw her Nov 2022 She is doing well No unusual fatigue which is her Afib symptom, she does not think she has had any. No CP, palpitations or unusual SOB No difficulties with ADLS No near syncope or syncope Infrequent orthostatic dizziness since the chang e I her lasix No bleeding or signs of bleeding No changes were made.  She saw Dr. Rayann Heman 03/17/22, underwent ILR implant to help manage her Afib post ablation.  No remotes Following with research team As of 06/29/22, no AFib   TODAY She is doing well No CP, palpitations or cardiac awareness No near syncope or syncope. She occasionally has some ankle edema, usually R>L No particular SOB No bleeding or signs of bleeding   Afib and AAD hx Amiodarone intolerant 2/2 dizziness, stopped 2018, there was mention of pulmonary toxicity as well. Tikosyn started May 2019 >> failed with QT prolongation during load despite down-titration to 15mcg PVI ablation Feb 2020 ILR implanted 03/17/22 and enrolled in the Alleviate Trial, MDT LNQII    Past Medical History:  Diagnosis Date   Arthritis    Breast cancer, left breast ([place]) 2002   "then treated w/chemo and radiation"   Breast cancer, left breast ([place]) 07/14/2017    "tx'd w/mastectomy"   Chronic back pain    h/o lumbar stenosis; "no problem since my OR" (`10/27/2017)   Chronic systolic CHF (congestive heart failure) (HCC)    Complication of anesthesia    last surgery iv med when going to sleep"burned as injected"   COPD (chronic obstructive pulmonary disease) ([place])    no inhalers    DCM (dilated cardiomyopathy) ([place])    EF 15-20% ? tachycardia induced - EF 35-40% by echo 05/2018   Dyspnea    with exertion   Gallstones    GERD (gastroesophageal reflux disease)    once in a while;depends on what she eats   History of bronchitis    "chronic when I smoked; no problem since I quit in 2002" (10/27/2017)   Hyperlipidemia    Hypertension    takes Losartan and Metoprolol.    Joint pain    Muscle spasm    takes Flexeril daily as needed    Nocturia    OSA (obstructive sleep apnea) 08/22/2018   Severe obstructive sleep apnea with an AHI 47.3/h and no significant central sleep apnea with moderate oxygen desaturations as low as 79%   Osteopenia    Persistent atrial fibrillation (HCC)    s/p TEE DCCV and repeat DCCV 11/14/2013 and 02/2018   Personal history of colonic polyps    adenomas 03 and 08   Pneumonia    hx   Seasonal allergies    Thyroid nodule    Type II diabetes mellitus ([place])  Vitamin D deficiency    takes Vit D    Past Surgical History:  Procedure Laterality Date   ATRIAL FIBRILLATION ABLATION N/A 12/12/2018   Procedure: ATRIAL FIBRILLATION ABLATION;  Surgeon: Thompson Grayer, MD;  Location: [facility];  Service: Cardiovascular;  Laterality: N/A;   AXILLARY LYMPH NODE DISSECTION Left 10/2001   persistent intramammary node /notes 03/09/2011   BACK SURGERY     BREAST BIOPSY Left 2002   BREAST BIOPSY Right 06/2017   "node bx was negative"   BREAST BIOPSY Left 06/2017   "positive for cancer"   BREAST LUMPECTOMY WITH RADIOACTIVE SEED LOCALIZATION Right 08/15/2017   Procedure: RIGHT BREAST LUMPECTOMY WITH RADIOACTIVE SEED  LOCALIZATION;  Surgeon: Jovita Kussmaul, MD;  Location: [facility];  Service: General;  Laterality: Right;   CARDIAC CATHETERIZATION  2015   CARDIOVERSION N/A 11/12/2013   Procedure: CARDIOVERSION;  Surgeon: Thayer Headings, MD;  Location: [facility];  Service: Cardiovascular;  Laterality: N/A;  10:08  Dr. Marissa Nestle, anesthesia present, [place]   '60mg'$ ,  propofol $RemoveB'50mg'[id number]$ , IV for elective cardioversion....Dr. Cathie Olden delievered synch 120 joules with successful cardioversion to NSR   CARDIOVERSION N/A 11/12/2013   Procedure: CARDIOVERSION;  Surgeon: Thayer Headings, MD;  Location: [facility];  Service: Cardiovascular;  Laterality: N/A;   CARDIOVERSION N/A 11/14/2013   Procedure: CARDIOVERSION (BEDSIDE);  Surgeon: Larey Dresser, MD;  Location: [facility];  Service: Cardiovascular;  Laterality: N/A;   CARDIOVERSION N/A 02/22/2018   Procedure: CARDIOVERSION;  Surgeon: Sueanne Margarita, MD;  Location: [facility]OS[facility];  Service: Cardiovascular;  Laterality: N/A;   CATARACT EXTRACTION W/ INTRAOCULAR LENS  IMPLANT, BILATERAL Bilateral ~ 2016   COLONOSCOPY  2003, September 2008, April 01, 2011   adenomas 03 and 08, polyp 12, diverticulosis   COLONOSCOPY WITH PROPOFOL N/A 11/16/2016   Procedure: COLONOSCOPY WITH PROPOFOL;  Surgeon: Gatha Mayer, MD;  Location: [facility];  Service: Endoscopy;  Laterality: N/A;   implantable loop recorder placement  03/17/2022   Medtronic Reveal Linq model LNQ 11 (SN [id number]) implantable loop recorder implanted for Alleviate HF trial   JOINT REPLACEMENT     LAPAROSCOPIC CHOLECYSTECTOMY  2004   LEFT AND RIGHT HEART CATHETERIZATION WITH CORONARY ANGIOGRAM N/A 02/01/2014   Procedure: LEFT AND RIGHT HEART CATHETERIZATION WITH CORONARY ANGIOGRAM;  Surgeon: Sueanne Margarita, MD;  Location: [place] CATH LAB;  Service: Cardiovascular;  Laterality: N/A;   LUMBAR LAMINECTOMY/DECOMPRESSION MICRODISCECTOMY N/A 03/19/2014   Procedure: LUMBAR LAMINECTOMY/DECOMPRESSION MICRODISCECTOMY 4 LEVEL;  Surgeon:  Kristeen Miss, MD;  Location: [place] [facility];  Service: Neurosurgery;  Laterality: N/A;  L1-2 L2-3 L3-4 L4-5 Laminectomy/Foraminotomy   MASS EXCISION Left 08/15/2017   Procedure: EXCISION OF LEFT BREAST MASS;  Surgeon: Jovita Kussmaul, MD;  Location: [facility];  Service: General;  Laterality: Left;   MASTECTOMY Left 10/27/2017   MASTECTOMY PARTIAL / LUMPECTOMY W/ AXILLARY LYMPHADENECTOMY  05/08/2001   Archie Endo 03/09/2011   PORT-A-CATH REMOVAL  2003   PORTA CATH INSERTION  2002   TEE WITHOUT CARDIOVERSION N/A 11/12/2013   Procedure: TRANSESOPHAGEAL ECHOCARDIOGRAM (TEE);  Surgeon: Thayer Headings, MD;  Location: [facility];  Service: Cardiovascular;  Laterality: N/A;  pt b/p low, pt buccal membranes very dry, lips scapped, pt c/o thirst. NPO since MN and iv FLUIDS TOTAL INFUSING AT TOTAL 20ML HR....  Dr. Cathie Olden order allow NS to bolus during procedure....very dry,NS bolus 250 ml total..pt responding well to meds.Marland Kitchen   TOTAL KNEE ARTHROPLASTY Left 2006   TOTAL KNEE ARTHROPLASTY Right 05/10/2016  Procedure: RIGHT TOTAL KNEE ARTHROPLASTY;  Surgeon: Ollen Gross, MD;  Location: [facility];  Service: Orthopedics;  Laterality: Right;  With adductor block   TOTAL MASTECTOMY Left 10/27/2017   Procedure: LEFT MASTECTOMY;  Surgeon: Chevis Pretty III, MD;  Location: [facility];  Service: General;  Laterality: Left;   TUBAL LIGATION      Current Outpatient Medications  Medication Sig Dispense Refill   atorvastatin (LIPITOR) 40 MG tablet TAKE 1 TABLET BY MOUTH EVERYDAY AT BEDTIME 90 tablet 1   blood glucose meter kit and supplies KIT Dispense based on patient and insurance preference. Use up to four times daily as directed. 1 each 0   Cholecalciferol (VITAMIN D3) 2000 units capsule Take 2,000 Units by mouth daily.     clotrimazole-betamethasone (LOTRISONE) cream Apply 1 application topically 2 (two) times daily. 30 g 0   colchicine 0.6 MG tablet TAKE 1 TABLET BY MOUTH 2 TIMES DAILY FOR 21 DAYS. 42 tablet 1   cyclobenzaprine  (FLEXERIL) 5 MG tablet Take 1 tablet (5 mg total) by mouth at bedtime as needed for muscle spasms. 30 tablet 0   dapagliflozin propanediol (FARXIGA) 10 MG TABS tablet Take 1 tablet (10 mg total) by mouth daily before breakfast. 90 tablet 1   fexofenadine (ALLEGRA) 180 MG tablet Take 180 mg by mouth daily as needed for allergies or rhinitis.     fluconazole (DIFLUCAN) 150 MG tablet TAKE 1 TABLET BY MOUTH ONE TIME PER WEEK 6 tablet 2   fluticasone (FLONASE) 50 MCG/ACT nasal spray Place 1 spray into both nostrils daily as needed for allergies.      furosemide (LASIX) 20 MG tablet As needed patient may take 20 MG Lasix PRN by mouth QD x 4 days as directed per Alleviate Research HF Study PRN plan. PLEASE DO NOT REMOVE. PT IS IN RESEARCH STUDY. 90 tablet 3   furosemide (LASIX) 40 MG tablet Take one tablet daily 90 tablet 3   gabapentin (NEURONTIN) 300 MG capsule TAKE 2 CAPSULES BY MOUTH AT BEDTIME 180 capsule 1   glucose blood test strip Use to check CBG AC and QHS. 200 each 12   KLOR-CON M20 20 MEQ tablet TAKE 1 TABLET BY MOUTH EVERY DAY 90 tablet 1   losartan (COZAAR) 25 MG tablet TAKE 1 TABLET BY MOUTH TWICE A DAY 180 tablet 2   metFORMIN (GLUCOPHAGE) 500 MG tablet Take 500 mg by mouth daily.     nebivolol (BYSTOLIC) 2.5 MG tablet TAKE 1 TABLET BY MOUTH EVERY DAY 90 tablet 3   OneTouch Delica Lancets 33G MISC 1 each by Does not apply route daily. 100 each 1   pantoprazole (PROTONIX) 40 MG tablet TAKE 1 TABLET BY MOUTH EVERY DAY 90 tablet 3   potassium chloride (KLOR-CON) 10 MEQ tablet As needed patient may take 10 mEq PRN by mouth QD x 4 days as directed per Alleviate Research HF Study PRN plan. PLEASE DO NOT REMOVE 90 tablet 3   spironolactone (ALDACTONE) 25 MG tablet Take 25 mg by mouth daily. Take 1/2 tablet on the days that you are taking 2 of the furosemide tablets.     traMADol (ULTRAM) 50 MG tablet Take 50 mg by mouth as needed for moderate pain.     XARELTO 20 MG TABS tablet TAKE 1 TABLET BY  MOUTH EVERY DAY WITH SUPPER 90 tablet 1   No current facility-administered medications for this visit.    Allergies:   Tikosyn [dofetilide], Zolpidem, Codeine, Codeine phosphate, and Methylprednisolone  Social History:  The patient  reports that she quit smoking about 20 years ago. Her smoking use included cigarettes. She has a 52.50 pack-year smoking history. She has never used smokeless tobacco. She reports that she does not drink alcohol and does not use drugs.   Family History:  The patient's family history includes Alzheimer's disease in her sister; Dementia in her sister; Diabetes in her brother, father, mother, and sister; Heart disease in her brother and father; Irritable bowel syndrome in her brother; Liver cancer in her mother; Pancreatic cancer in her mother.  ROS:  Please see the history of present illness.    All other systems are reviewed and otherwise negative.   PHYSICAL EXAM:  VS:  There were no vitals taken for this visit. BMI: There is no height or weight on file to calculate BMI. Well nourished, well developed, in no acute distress HEENT: normocephalic, atraumatic Neck: no JVD, carotid bruits or masses Cardiac:  RRR; no significant murmurs, no rubs, or gallops Lungs:  CTA b/l, no wheezing, rhonchi or rales Abd: soft, nontender MS: no deformity or atrophy Ext: no edema Skin: warm and dry, no rash Neuro:  No gross deficits appreciated Psych: euthymic mood, full affect    EKG:  not done today   01/21/2022: TTE  1. Left ventricular ejection fraction, by estimation, is 45 to 50%. The  left ventricle has mildly decreased function. The left ventricle  demonstrates global hypokinesis. Left ventricular diastolic parameters  were normal.   2. Right ventricular systolic function is normal. The right ventricular  size is normal. There is normal pulmonary artery systolic pressure.   3. Left atrial size was mildly dilated.   4. The mitral valve is normal in structure.  Trivial mitral valve  regurgitation. No evidence of mitral stenosis.   5. The aortic valve is tricuspid. Aortic valve regurgitation is not  visualized. No aortic stenosis is present.   6. The inferior vena cava is normal in size with greater than 50%  respiratory variability, suggesting right atrial pressure of 3 mmHg.   Comparison(s): No significant change from prior study.    07/30/2020: TTE IMPRESSIONS  1. Left ventricular ejection fraction, by estimation, is 45 to 50%. Left  ventricular ejection fraction by 3D volume is 45 %. The left ventricle has  mildly decreased function. The left ventricle has no regional wall motion  abnormalities. Left ventricular   diastolic parameters are consistent with Grade I diastolic dysfunction  (impaired relaxation). Elevated left atrial pressure.   2. Right ventricular systolic function is normal. The right ventricular  size is normal. Tricuspid regurgitation signal is inadequate for assessing  PA pressure.   3. A small pericardial effusion is present. The pericardial effusion is  circumferential. There is no evidence of cardiac tamponade.   4. The mitral valve is grossly normal. Trivial mitral valve  regurgitation. No evidence of mitral stenosis.   5. The aortic valve is tricuspid. Aortic valve regurgitation is not  visualized. No aortic stenosis is present.   6. The inferior vena cava is normal in size with greater than 50%  respiratory variability, suggesting right atrial pressure of 3 mmHg.   Comparison(s): No significant change from prior study.    12/12/2018: EPS, ablation CONCLUSIONS: 1. Sinus rhythm upon presentation.  Of note, she has chronic SOB and is followed by pulmonary.  Her family noted her to be visibly dyspneic in walking into the hospital today.  She has severe OSA and does not use her CPAP.  2. Intracardiac echo reveals a small R middle PV.  There was also a common ostium to the left superior and inferior PVs.  3. 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   Despite ablation, the right PVs could not be fully isolated today.  There appeared to be conduction near the left middle PV caraina.  Though some ablation was delivered in this location, it was felt that full ablation for isolation could not be achieved due to the risks of PVS. 4. No inducible arrhythmias following ablation both on and off of Isuprel 5. No early apparent complications.   04/10/2018: stress myoview Nuclear stress EF: 41%. No T wave inversion was noted during stress. There was no ST segment deviation noted during stress. This is an intermediate risk study.   Patchy poor tracer uptake. A few small fixed defects not corresponding to vascular distribution. No reversible ischemia. LVEF 41% with global hypokinesis. This is an intermediate risk study due to reduced LV systolic function  Recent Labs: 12/16/2021: ALT 13; TSH 0.803 01/08/2022: NT-Pro BNP 481 03/17/2022: BNP 100.3; BUN 28; Creatinine, Ser 1.06; Hemoglobin 13.4; Platelets 303; Potassium 4.4; Sodium 140  No results found for requested labs within last 365 days.   CrCl cannot be calculated (Patient's most recent lab result is older than the maximum 21 days allowed.).   Wt Readings from Last 3 Encounters:  05/25/22 228 lb 4.8 oz (103.6 kg)  05/11/22 229 lb (103.9 kg)  03/17/22 230 lb 9.6 oz (104.6 kg)     Other studies reviewed: Additional studies/records reviewed today include: summarized above  ASSESSMENT AND PLAN:  Persistent AFib CHA2DS2Vasc is 5, on Xarelto, appropriately dosed No symptoms of her AFib  No AFib  HTN Looks good  OSA Will revisit possible alternatives to CPAP mask, she was unable to tolerate   Chronic CHF (systolic) NICM EF 04-59% for a couple years No symptoms or exam findings to suggest volume OL Enrolled in the Alleviate trial, being monitored by research team. Sees Dr. Radford Pax in November  Labs today   Will plan to  have her transition/meet Dr. Quentin Ore   Disposition: F/u with EP in 97mo, sooner if needed  Current medicines are reviewed at length with the patient today.  The patient did not have any concerns regarding medicines.  Venetia Night, PA-C 06/29/2022 12:39 PM     [address] [place] [address] [phone] (office)  [phone] (fax)

## 2022-07-01 ENCOUNTER — Ambulatory Visit: Payer: Medicare Other | Attending: Physician Assistant | Admitting: Physician Assistant

## 2022-07-01 ENCOUNTER — Encounter: Payer: Self-pay | Admitting: Physician Assistant

## 2022-07-01 VITALS — BP 110/64 | HR 74 | Ht 66.0 in | Wt 223.0 lb

## 2022-07-01 DIAGNOSIS — I5022 Chronic systolic (congestive) heart failure: Secondary | ICD-10-CM | POA: Diagnosis not present

## 2022-07-01 DIAGNOSIS — I4819 Other persistent atrial fibrillation: Secondary | ICD-10-CM

## 2022-07-01 DIAGNOSIS — Z4509 Encounter for adjustment and management of other cardiac device: Secondary | ICD-10-CM

## 2022-07-01 DIAGNOSIS — I428 Other cardiomyopathies: Secondary | ICD-10-CM

## 2022-07-01 DIAGNOSIS — I10 Essential (primary) hypertension: Secondary | ICD-10-CM | POA: Diagnosis not present

## 2022-07-01 NOTE — Patient Instructions (Signed)
Medication Instructions:   Your physician recommends that you continue on your current medications as directed. Please refer to the Current Medication list given to you today.   *If you need a refill on your cardiac medications before your next appointment, please call your pharmacy*   Lab Work: BMET AND CBC TODAY    If you have labs (blood work) drawn today and your tests are completely normal, you will receive your results only by: [place] (if you have MyChart) OR A paper copy in the mail If you have any lab test that is abnormal or we need to change your treatment, we will call you to review the results.   Testing/Procedures: NONE ORDERED  TODAY     Follow-Up: At [facility], you and your health needs are our priority.  As part of our continuing mission to provide you with exceptional heart care, we have created designated Provider Care Teams.  These Care Teams include your primary Cardiologist (physician) and Advanced Practice Providers (APPs -  Physician Assistants and Nurse Practitioners) who all work together to provide you with the care you need, when you need it.  We recommend signing up for the patient portal called "MyChart".  Sign up information is provided on this After Visit Summary.  MyChart is used to connect with patients for Virtual Visits (Telemedicine).  Patients are able to view lab/test results, encounter notes, upcoming appointments, etc.  Non-urgent messages can be sent to your provider as well.   To learn more about what you can do with MyChart, go to [email or web address].    Your next appointment:   6 month(s)  The format for your next appointment:   In Person  Provider:   Lars Mage, MD     Other Instructions:  Important Information About Sugar

## 2022-07-01 NOTE — Addendum Note (Signed)
Addended by: Claude Manges on: 07/01/2022 04:34 PM   Modules accepted: Orders

## 2022-07-02 LAB — CBC
Hematocrit: 34.3 % (ref 34.0–46.6)
Hemoglobin: 11.8 g/dL (ref 11.1–15.9)
MCH: 29.2 pg (ref 26.6–33.0)
MCHC: 34.4 g/dL (ref 31.5–35.7)
MCV: 85 fL (ref 79–97)
Platelets: 367 10*3/uL (ref 150–450)
RBC: 4.04 x10E6/uL (ref 3.77–5.28)
RDW: 13.2 % (ref 11.7–15.4)
WBC: 9.1 10*3/uL (ref 3.4–10.8)

## 2022-07-02 LAB — BASIC METABOLIC PANEL
BUN/Creatinine Ratio: 24 (ref 12–28)
BUN: 32 mg/dL — ABNORMAL HIGH (ref 8–27)
CO2: 19 mmol/L — ABNORMAL LOW (ref 20–29)
Calcium: 10.1 mg/dL (ref 8.7–10.3)
Chloride: 102 mmol/L (ref 96–106)
Creatinine, Ser: 1.32 mg/dL — ABNORMAL HIGH (ref 0.57–1.00)
Glucose: 112 mg/dL — ABNORMAL HIGH (ref 70–99)
Potassium: 4.3 mmol/L (ref 3.5–5.2)
Sodium: 140 mmol/L (ref 134–144)
eGFR: 42 mL/min/{1.73_m2} — ABNORMAL LOW (ref >59)

## 2022-07-15 ENCOUNTER — Other Ambulatory Visit: Payer: Self-pay

## 2022-07-15 ENCOUNTER — Emergency Department (HOSPITAL_BASED_OUTPATIENT_CLINIC_OR_DEPARTMENT_OTHER): Payer: Medicare Other

## 2022-07-15 ENCOUNTER — Emergency Department (HOSPITAL_BASED_OUTPATIENT_CLINIC_OR_DEPARTMENT_OTHER)
Admission: EM | Admit: 2022-07-15 | Discharge: 2022-07-15 | Disposition: A | Discharge status: Home / Self Care | Payer: Medicare Other | Attending: Emergency Medicine | Admitting: Emergency Medicine

## 2022-07-15 ENCOUNTER — Telehealth: Payer: Self-pay | Admitting: Pharmacist

## 2022-07-15 ENCOUNTER — Encounter (HOSPITAL_BASED_OUTPATIENT_CLINIC_OR_DEPARTMENT_OTHER): Payer: Self-pay

## 2022-07-15 DIAGNOSIS — Z20822 Contact with and (suspected) exposure to covid-19: Secondary | ICD-10-CM | POA: Insufficient documentation

## 2022-07-15 DIAGNOSIS — I4891 Unspecified atrial fibrillation: Secondary | ICD-10-CM | POA: Diagnosis not present

## 2022-07-15 DIAGNOSIS — R0789 Other chest pain: Secondary | ICD-10-CM | POA: Diagnosis not present

## 2022-07-15 DIAGNOSIS — D72829 Elevated white blood cell count, unspecified: Secondary | ICD-10-CM | POA: Diagnosis not present

## 2022-07-15 DIAGNOSIS — Z853 Personal history of malignant neoplasm of breast: Secondary | ICD-10-CM | POA: Diagnosis not present

## 2022-07-15 DIAGNOSIS — R079 Chest pain, unspecified: Secondary | ICD-10-CM | POA: Diagnosis not present

## 2022-07-15 DIAGNOSIS — Z7984 Long term (current) use of oral hypoglycemic drugs: Secondary | ICD-10-CM | POA: Insufficient documentation

## 2022-07-15 DIAGNOSIS — R590 Localized enlarged lymph nodes: Secondary | ICD-10-CM | POA: Diagnosis not present

## 2022-07-15 DIAGNOSIS — J449 Chronic obstructive pulmonary disease, unspecified: Secondary | ICD-10-CM | POA: Insufficient documentation

## 2022-07-15 DIAGNOSIS — Z006 Encounter for examination for normal comparison and control in clinical research program: Secondary | ICD-10-CM

## 2022-07-15 DIAGNOSIS — I700 Atherosclerosis of aorta: Secondary | ICD-10-CM | POA: Diagnosis not present

## 2022-07-15 DIAGNOSIS — E119 Type 2 diabetes mellitus without complications: Secondary | ICD-10-CM | POA: Diagnosis not present

## 2022-07-15 DIAGNOSIS — I509 Heart failure, unspecified: Secondary | ICD-10-CM | POA: Insufficient documentation

## 2022-07-15 LAB — CBC
HCT: 34.6 % — ABNORMAL LOW (ref 36.0–46.0)
Hemoglobin: 11.6 g/dL — ABNORMAL LOW (ref 12.0–15.0)
MCH: 28.9 pg (ref 26.0–34.0)
MCHC: 33.5 g/dL (ref 30.0–36.0)
MCV: 86.3 fL (ref 80.0–100.0)
Platelets: 341 10*3/uL (ref 150–400)
RBC: 4.01 MIL/uL (ref 3.87–5.11)
RDW: 14.7 % (ref 11.5–15.5)
WBC: 11.4 10*3/uL — ABNORMAL HIGH (ref 4.0–10.5)
nRBC: 0 % (ref 0.0–0.2)

## 2022-07-15 LAB — BASIC METABOLIC PANEL WITH GFR
Anion gap: 15 (ref 5–15)
BUN: 30 mg/dL — ABNORMAL HIGH (ref 8–23)
CO2: 20 mmol/L — ABNORMAL LOW (ref 22–32)
Calcium: 10.4 mg/dL — ABNORMAL HIGH (ref 8.9–10.3)
Chloride: 103 mmol/L (ref 98–111)
Creatinine, Ser: 1.17 mg/dL — ABNORMAL HIGH (ref 0.44–1.00)
GFR, Estimated: 48 mL/min — ABNORMAL LOW
Glucose, Bld: 147 mg/dL — ABNORMAL HIGH (ref 70–99)
Potassium: 4.4 mmol/L (ref 3.5–5.1)
Sodium: 138 mmol/L (ref 135–145)

## 2022-07-15 LAB — RESP PANEL BY RT-PCR (FLU A&B, COVID) ARPGX2
Influenza A by PCR: NEGATIVE
Influenza B by PCR: NEGATIVE
SARS Coronavirus 2 by RT PCR: NEGATIVE

## 2022-07-15 LAB — TROPONIN I (HIGH SENSITIVITY)
Troponin I (High Sensitivity): 6 ng/L
Troponin I (High Sensitivity): 7 ng/L

## 2022-07-15 LAB — CK: Total CK: 355 U/L — ABNORMAL HIGH (ref 38–234)

## 2022-07-15 MED ORDER — SODIUM CHLORIDE 0.9 % IV BOLUS: 1000.0000 mL | Freq: Once | INTRAVENOUS | Status: DC

## 2022-07-15 MED ORDER — HYDROCODONE-ACETAMINOPHEN 5-325 MG PO TABS: 1.0000 | ORAL_TABLET | Freq: Four times a day (QID) | ORAL | 0 refills | Status: DC | PRN

## 2022-07-15 MED ORDER — ALUM & MAG HYDROXIDE-SIMETH 200-200-20 MG/5ML PO SUSP
30.0000 mL | Freq: Once | ORAL | Status: AC
  Administered 2022-07-15: 30 mL via ORAL
  Filled 2022-07-15: qty 30

## 2022-07-15 MED ORDER — IOHEXOL 350 MG/ML SOLN
100.0000 mL | Freq: Once | INTRAVENOUS | Status: AC | PRN
  Administered 2022-07-15: 75 mL via INTRAVENOUS

## 2022-07-15 MED ORDER — MORPHINE SULFATE (PF) 4 MG/ML IV SOLN
4.0000 mg | Freq: Once | INTRAVENOUS | Status: AC
  Administered 2022-07-15: 4 mg via INTRAVENOUS
  Filled 2022-07-15: qty 1

## 2022-07-15 MED ORDER — NITROGLYCERIN 0.4 MG SL SUBL
0.4000 mg | SUBLINGUAL_TABLET | SUBLINGUAL | Status: DC | PRN
  Filled 2022-07-15: qty 1

## 2022-07-15 NOTE — ED Triage Notes (Signed)
Pt states chest tightness since waking up this AM. Pt describes generalized body aches.

## 2022-07-15 NOTE — ED Notes (Signed)
Pt returned from CT °

## 2022-07-15 NOTE — ED Provider Notes (Addendum)
[facility] EMERGENCY DEPT Provider Note   CSN: [id number] Arrival date & time: 07/15/22  1612     History  Chief Complaint  Patient presents with   Chest Pain    Cynthia Herrera is a 76 y.o. female.  The history is provided by the patient and medical records. No language interpreter was used.  Chest Pain    Patient is a 76 year old female presenting with a chief complaint of chest tightness. She states she started experiencing chest tightness and generalized body aches last night. She reports the body aches were waking her up throughout the night. She describes her chest pain as a heaviness on her chest that has been constant throughout the day. She tried taking ibuprofen at 11:00am which alleviated her body aches but did not alleviate her chest pain. She denies fever, nasal congestion, cough, dyspnea, wheezing, dizziness.  Home Medications Prior to Admission medications   Medication Sig Start Date End Date Taking? Authorizing Provider  atorvastatin (LIPITOR) 40 MG tablet TAKE 1 TABLET BY MOUTH EVERYDAY AT BEDTIME 12/16/21   Sueanne Margarita, MD  blood glucose meter kit and supplies KIT Dispense based on patient and insurance preference. Use up to four times daily as directed. 05/13/22   Isaac Bliss, Rayford Halsted, MD  Cholecalciferol (VITAMIN D3) 2000 units capsule Take 2,000 Units by mouth daily.    [provider]  clotrimazole-betamethasone (LOTRISONE) cream Apply 1 application topically 2 (two) times daily. 02/23/18   Evelina Bucy, DPM  colchicine 0.6 MG tablet TAKE 1 TABLET BY MOUTH 2 TIMES DAILY FOR 21 DAYS. 02/11/22   Isaac Bliss, Rayford Halsted, MD  cyclobenzaprine (FLEXERIL) 5 MG tablet Take 1 tablet (5 mg total) by mouth at bedtime as needed for muscle spasms. 05/13/22   Isaac Bliss, Rayford Halsted, MD  dapagliflozin propanediol (FARXIGA) 10 MG TABS tablet Take 1 tablet (10 mg total) by mouth daily before breakfast. 03/08/22   Isaac Bliss, Rayford Halsted,  MD  fexofenadine (ALLEGRA) 180 MG tablet Take 180 mg by mouth daily as needed for allergies or rhinitis.    [provider]  fluconazole (DIFLUCAN) 150 MG tablet TAKE 1 TABLET BY MOUTH ONE TIME PER WEEK 03/23/22   McDonald, Adam R, DPM  fluticasone (FLONASE) 50 MCG/ACT nasal spray Place 1 spray into both nostrils daily as needed for allergies.     [provider]  furosemide (LASIX) 20 MG tablet As needed patient may take 20 MG Lasix PRN by mouth QD x 4 days as directed per [facility] HF Study PRN plan. PLEASE DO NOT REMOVE. PT IS IN RESEARCH STUDY. 03/17/22 03/18/23  Allred, Jeneen Rinks, MD  furosemide (LASIX) 40 MG tablet Take one tablet daily 06/08/22   Allred, Jeneen Rinks, MD  gabapentin (NEURONTIN) 300 MG capsule TAKE 2 CAPSULES BY MOUTH AT BEDTIME 02/11/22   Isaac Bliss, Rayford Halsted, MD  glucose blood test strip Use to check CBG AC and QHS. 03/19/21   Erline Hau, MD  KLOR-CON M20 20 MEQ tablet TAKE 1 TABLET BY MOUTH EVERY DAY 11/27/21   Sueanne Margarita, MD  losartan (COZAAR) 25 MG tablet TAKE 1 TABLET BY MOUTH TWICE A DAY 03/05/22   Sueanne Margarita, MD  metFORMIN (GLUCOPHAGE) 500 MG tablet Take 500 mg by mouth daily.    [provider]  nebivolol (BYSTOLIC) 2.5 MG tablet TAKE 1 TABLET BY MOUTH EVERY DAY 02/11/22   Swinyer, Lanice Schwab, NP  OneTouch Delica Lancets 65L MISC 1 each  by Does not apply route daily. 03/27/21   Isaac Bliss, Rayford Halsted, MD  pantoprazole (PROTONIX) 40 MG tablet TAKE 1 TABLET BY MOUTH EVERY DAY 03/11/22   Allred, Jeneen Rinks, MD  potassium chloride (KLOR-CON) 10 MEQ tablet As needed patient may take 10 mEq PRN by mouth QD x 4 days as directed per [facility] HF Study PRN plan. PLEASE DO NOT REMOVE 03/17/22 03/18/23  Allred, Jeneen Rinks, MD  spironolactone (ALDACTONE) 25 MG tablet Take 25 mg by mouth daily. Take 1/2 tablet on the days that you are taking 2 of the furosemide tablets.    [provider]  traMADol (ULTRAM) 50 MG tablet Take 50  mg by mouth as needed for moderate pain.    [provider]  XARELTO 20 MG TABS tablet TAKE 1 TABLET BY MOUTH EVERY DAY WITH SUPPER 04/22/22   Allred, Jeneen Rinks, MD      Allergies    Tikosyn [dofetilide], Zolpidem, Codeine, Codeine phosphate, and Methylprednisolone    Review of Systems   Review of Systems  Cardiovascular:  Positive for chest pain.  All other systems reviewed and are negative.   Physical Exam Updated Vital Signs BP 121/69 (BP Location: Right Arm)   Pulse 77   Temp 98.4 F (36.9 C)   Resp 16   Ht _0  (1.676 m)   Wt 97.5 kg   SpO2 100%   BMI 34.70 kg/m  Physical Exam Vitals and nursing note reviewed.  Constitutional:      General: She is not in acute distress.    Appearance: She is well-developed.  HENT:     Head: Atraumatic.  Eyes:     Conjunctiva/sclera: Conjunctivae normal.  Cardiovascular:     Rate and Rhythm: Normal rate and regular rhythm.     Pulses: Normal pulses.     Heart sounds: Normal heart sounds.  Pulmonary:     Effort: Pulmonary effort is normal.     Breath sounds: No wheezing, rhonchi or rales.  Chest:     Chest wall: No tenderness.  Abdominal:     General: Abdomen is flat.     Palpations: Abdomen is soft.     Tenderness: There is no abdominal tenderness.  Musculoskeletal:     Cervical back: Neck supple.  Skin:    Findings: No rash.  Neurological:     Mental Status: She is alert.  Psychiatric:        Mood and Affect: Mood normal.     ED Results / Procedures / Treatments   Labs (all labs ordered are listed, but only abnormal results are displayed) Labs Reviewed  BASIC METABOLIC PANEL - Abnormal; Notable for the following components:      Result Value   CO2 20 (*)    Glucose, Bld 147 (*)    BUN 30 (*)    Creatinine, Ser 1.17 (*)    Calcium 10.4 (*)    GFR, Estimated 48 (*)    All other components within normal limits  CBC - Abnormal; Notable for the following components:   WBC 11.4 (*)    Hemoglobin 11.6 (*)     HCT 34.6 (*)    All other components within normal limits  CK - Abnormal; Notable for the following components:   Total CK 355 (*)    All other components within normal limits  RESP PANEL BY RT-PCR (FLU A&B, COVID) [id number]  TROPONIN I (HIGH SENSITIVITY)  TROPONIN I (HIGH SENSITIVITY)    EKG EKG Interpretation  Date/Time:  Thursday July 15 2022 16:29:38 EDT Ventricular Rate:  76 PR Interval:  144 QRS Duration: 90 QT Interval:  386 QTC Calculation: 434 R Axis:   54 Text Interpretation: Sinus rhythm with occasional Premature ventricular complexes Nonspecific ST and T wave abnormality Abnormal ECG When compared with ECG of 17-Sep-2019 13:59, Premature ventricular complexes are now Present No significant change was found diffuse ST depression Confirmed by Ezequiel Essex [phone]) on 07/15/2022 5:04:49 PM  Radiology CT Angio Chest/Abd/Pel for Dissection W and/or Wo Contrast  Result Date: 07/15/2022 CLINICAL DATA:  Chest pain, back pain, clinical suspicion for aortic dissection EXAM: CT ANGIOGRAPHY CHEST, ABDOMEN AND PELVIS TECHNIQUE: Non-contrast CT of the chest was initially obtained. Multidetector CT imaging through the chest, abdomen and pelvis was performed using the standard protocol during bolus administration of intravenous contrast. Multiplanar reconstructed images and MIPs were obtained and reviewed to evaluate the vascular anatomy. RADIATION DOSE REDUCTION: This exam was performed according to the departmental dose-optimization program which includes automated exposure control, adjustment of the mA and/or kV according to patient size and/or use of iterative reconstruction technique. CONTRAST:  27m OMNIPAQUE IOHEXOL 350 MG/ML SOLN COMPARISON:  Previous studies including the chest radiographs done earlier today and CT chest done on 04/07/2021 FINDINGS: CTA CHEST FINDINGS Cardiovascular: There is no demonstrable mural hematoma in thoracic aorta in the noncontrast images. Extensive  coronary artery calcifications are seen. There is homogeneous enhancement in thoracic aorta. There is ectasia of main pulmonary artery measuring 3.4 cm suggesting pulmonary arterial hypertension. There are no intraluminal filling defects in pulmonary artery branches. Mediastinum/Nodes: There are slightly enlarged lymph nodes in mediastinum measuring up to 1.3 cm in short axis. There is interval increase in size of lymph nodes in the mediastinum. There are enlarged lymph nodes in both hilar regions measuring up to 1.5 cm in short axis with interval increase in size there are enlarged lymph nodes in the right axilla measuring up to 12 mm in short axis with interval increase in size. Lungs/Pleura: There is slight prominence of interstitial markings in both lungs. There is mild ectasia of bronchi. There is no demonstrable subpleural honeycombing. Overall, no significant interval changes are noted in the lung fields. Musculoskeletal: Bony structures in the thorax are unremarkable. Review of the MIP images confirms the above findings. CTA ABDOMEN AND PELVIS FINDINGS VASCULAR Aorta: Fairly extensive scattered calcifications are seen. There is no evidence of dissection or focal aneurysmal dilation. Celiac: There are scattered calcifications without focal high-grade stenosis. SMA: There are scattered calcifications with possible moderate narrowing of SMA 2 cm distal to its origin. Renals: There are coarse calcifications in the proximal courses of both renal arteries without high-grade stenosis. IMA: Patent. Inflow: Unremarkable. Veins: Unremarkable. Review of the MIP images confirms the above findings. NON-VASCULAR Hepatobiliary: There is fatty infiltration. Gallbladder is not seen. There is no dilation of bile ducts. Pancreas: No focal abnormalities are seen. Spleen: Spleen measures 15.9 cm in maximum diameter. Adrenals/Urinary Tract: Adrenals are unremarkable. There are no renal or ureteral stones. There is slight  prominence of right renal pelvis without significant dilation of minor calices. There are no calcific densities in the courses of the ureters. Urinary bladder is not distended. Stomach/Bowel: Stomach is unremarkable. Small bowel loops are not dilated. Appendix is not dilated. There is no significant wall thickening in colon. Lymphatic: There are subcentimeter nodes in retroperitoneum and mesentery. Reproductive: Unremarkable. Other: There is no ascites or pneumoperitoneum. There is previous left mastectomy. There is interval decrease in size of postoperative  seroma. Musculoskeletal: Scattered Review of the MIP images confirms the above findings. IMPRESSION: There is no evidence of dissection in thoracic and abdominal aorta. Major branches of thoracic and abdominal aorta appear patent. There is no evidence of pulmonary artery embolism. There is ectasia of main pulmonary artery suggesting pulmonary arterial hypertension. Coronary artery calcifications are seen. There are enlarged lymph nodes in mediastinum and both hilar regions with interval worsening. Findings may suggest possible metastatic lymphadenopathy. There is decrease in height and inhomogeneous attenuation in the body of T6 vertebra which was not seen in the study done on 04/07/2021 suggesting possible skeletal metastatic disease. Follow-up PET-CT and tissue sampling as warranted may be considered. Chronic interstitial lung disease. There is no focal pulmonary consolidation. There is no evidence of intestinal obstruction or pneumoperitoneum. There is no hydronephrosis. Appendix is not dilated. Fatty liver. Splenomegaly. Other findings as described in the body of the report. Electronically Signed   By: Elmer Picker M.D.   On: 07/15/2022 20:40   DG Chest 2 View  Result Date: 07/15/2022 CLINICAL DATA:  ED Triage Notes - Pt states chest tightness since waking up this AM. Pt describes generalized body aches. EXAM: CHEST - 2 VIEW COMPARISON:   06/02/2022 FINDINGS: Lungs are clear. Heart size and mediastinal contours are within normal limits. Aortic Atherosclerosis (ICD10-170.0). Implanted subcutaneous event recorder overlies the heart as before. No effusion. Visualized bones unremarkable. IMPRESSION: No acute cardiopulmonary disease. Electronically Signed   By: Lucrezia Europe M.D.   On: 07/15/2022 17:07    Procedures Procedures    Medications Ordered in ED Medications  nitroGLYCERIN (NITROSTAT) SL tablet 0.4 mg (has no administration in time range)  alum & mag hydroxide-simeth (MAALOX/MYLANTA) 200-200-20 MG/5ML suspension 30 mL (30 mLs Oral Given 07/15/22 1823)  morphine (PF) 4 MG/ML injection 4 mg (4 mg Intravenous Given 07/15/22 1928)  iohexol (OMNIPAQUE) 350 MG/ML injection 100 mL (75 mLs Intravenous Contrast Given 07/15/22 2003)    ED Course/ Medical Decision Making/ A&P                           Medical Decision Making Amount and/or Complexity of Data Reviewed Labs: ordered. Radiology: ordered. ECG/medicine tests: ordered.  Risk OTC drugs. Prescription drug management.   BP 121/69 (BP Location: Right Arm)   Pulse 77   Temp 98.4 F (36.9 C)   Resp 16   Ht _0  (1.676 m)   Wt 97.5 kg   SpO2 100%   BMI 34.70 kg/m   This is a 76 year old female with multiple comorbidity which includes atrial fibrillation currently on Xarelto, COPD, CHF, breast cancer, diabetes, dyspnea, OSA, who presents complaining of chest tightness.  Patient endorsed tightness across her chest as well as generalized body aches that started this morning.  She was having difficulty sleeping last night due to it.  She tries taking ibuprofen without relief.  She still endorse persistent chest tightness.  She does not endorse any fever or chills no runny nose sneezing or coughing no sore throat no wheezing.  She denies any urinary symptoms.  She denies any recent sick contacts.  She has not noticed any increasing fluid retention or leg swelling.  She has  had prior knee surgery many years in the past but no recent surgery.  She is compliant with her Xarelto.  She denies tobacco use.  On exam, patient is well-appearing appears to be in no acute discomfort.  Vital signs stable no fever no hypoxia  blood pressure is normal.  EKG reviewed and unchanged from prior.  Labs and imaging obtained independently viewed interpreted by me.  Mild AKI with a creatinine of 1.17 which is an improvement from prior value 2 weeks ago.  Minimally elevated white count of 11.4.  Hemoglobin is 11.6 near baseline.  COVID test came back negative.  Flu test is negative as well.  X-ray individually visualized and interpreted by me and I agree with radiologist interpretation.  Chest x-ray unremarkable no evidence of fluid retention or pneumonia.  In the setting of regular use of Xarelto and no complaints of shortness of breath I have low suspicion for PE.  Patient however does have moderate risk for cardiac disease therefore she will need delta troponin to assess for potential cardiac source.  Symptoms most suggestive of a viral infection but given new onset, and negative COVID test, she has not been rule out for potential viral infection.  7:26 PM Patient was given a GI cocktail but she reported absolutely no improvement of her symptoms with GI cocktail.  At this time she endorsed increasing pain.  I discussed patient's labs which are reassuring however with worsening pain after discussion, will provide morphine for pain control and I have ordered a dissection study.  9:08 PM Dissection study obtained show no evidence of dissection and no evidence of PE.  Unfortunately there are enlarged lymph nodes in the mediastinum and both hilar region with interval worsening concerning for possible metastatic lymphadenopathy.  There is also findings suggestive of possible skeletal metastasis disease as well.  Patient does have history of breast cancer in remission.  This findings can explain her  chest discomfort.  I have reached out to on-call oncologist Dr. Annitta Jersey who recommend for patient to follow-up closely with her cancer doctor in the next several days for outpatient management.  I have sent a private message to Dr. Lindi Adie and also instruct patient to call the office tomorrow for follow-up.  Patient voiced understanding and agrees with plan.  Will discharge home with opiate pain medication to use as needed.  Return precaution given.  This patient presents to the ED for concern of chest pain, this involves an extensive number of treatment options, and is a complaint that carries with it a high risk of complications and morbidity.  The differential diagnosis includes acs, pe, dissection, pna, costochondritis, gastritis, malignancy  Co morbidities that complicate the patient evaluation breast CA Additional history obtained:  Additional history obtained from family members External records from outside source obtained and reviewed including EMR including labs and imaging  Lab Tests:  I Ordered, and personally interpreted labs.  The pertinent results include:  as above  Imaging Studies ordered:  I ordered imaging studies including chest CTA I independently visualized and interpreted imaging which showed lymphadenopathy and possible malignancy I agree with the radiologist interpretation  Cardiac Monitoring:  The patient was maintained on a cardiac monitor.  I personally viewed and interpreted the cardiac monitored which showed an underlying rhythm of: NSR  Medicines ordered and prescription drug management:  I ordered medication including morphine  for pain Reevaluation of the patient after these medicines showed that the patient improved I have reviewed the patients home medicines and have made adjustments as needed  Test Considered: as above  Critical Interventions: Opiate medication  Consultations Obtained:  I requested consultation with the oncologist Dr.  Annitta Jersey,  and discussed lab and imaging findings as well as pertinent plan - they recommend: outpt f/u  Problem  List / ED Course: metastatic spread on CA  cp  Reevaluation:  After the interventions noted above, I reevaluated the patient and found that they have :improved  Social Determinants of Health: inactivity  Dispostion:  After consideration of the diagnostic results and the patients response to treatment, I feel that the patent would benefit from outpt f/u.         Final Clinical Impression(s) / ED Diagnoses Final diagnoses:  Other chest pain  Lymphadenopathy, hilar    Rx / DC Orders ED Discharge Orders          Ordered    HYDROcodone-acetaminophen (NORCO/VICODIN) 5-325 MG tablet  Every 6 hours PRN        07/15/22 2124              Domenic Moras, PA-C 07/15/22 2156    Rancour, Annie Main, MD 07/16/22 7412   ADDENDUM: radiologist report there's a slightly displaced 6th rib fx on the left side on the CT scan that was not documented earlier.  I have reached out to pt's son to notify the result and recommend f/u with PCP for further care.  I also provide definitive ribs fracture care instruction over the phone.     Domenic Moras, PA-C 07/16/22 1210    Ezequiel Essex, MD 07/16/22 [phone]

## 2022-07-15 NOTE — Chronic Care Management (AMB) (Signed)
    Chronic Care Management Pharmacy Assistant   Name: Cynthia Herrera  MRN: [id number] DOB: [birth year]  Reason for Encounter: Reschedule appointment with [name], Clinical Pharmacist.  Appointment rescheduled with patient to 08/17/2022.  [place] Pharmacist Assistant [phone]

## 2022-07-15 NOTE — Research (Signed)
[facility] 4 Month Follow up  No adverse events to report or cardiovascular medication changes at this time.   Current Outpatient Medications:    atorvastatin (LIPITOR) 40 MG tablet, TAKE 1 TABLET BY MOUTH EVERYDAY AT BEDTIME, Disp: 90 tablet, Rfl: 1   blood glucose meter kit and supplies KIT, Dispense based on patient and insurance preference. Use up to four times daily as directed., Disp: 1 each, Rfl: 0   Cholecalciferol (VITAMIN D3) 2000 units capsule, Take 2,000 Units by mouth daily., Disp: , Rfl:    clotrimazole-betamethasone (LOTRISONE) cream, Apply 1 application topically 2 (two) times daily., Disp: 30 g, Rfl: 0   colchicine 0.6 MG tablet, TAKE 1 TABLET BY MOUTH 2 TIMES DAILY FOR 21 DAYS., Disp: 42 tablet, Rfl: 1   cyclobenzaprine (FLEXERIL) 5 MG tablet, Take 1 tablet (5 mg total) by mouth at bedtime as needed for muscle spasms., Disp: 30 tablet, Rfl: 0   dapagliflozin propanediol (FARXIGA) 10 MG TABS tablet, Take 1 tablet (10 mg total) by mouth daily before breakfast., Disp: 90 tablet, Rfl: 1   fexofenadine (ALLEGRA) 180 MG tablet, Take 180 mg by mouth daily as needed for allergies or rhinitis., Disp: , Rfl:    fluconazole (DIFLUCAN) 150 MG tablet, TAKE 1 TABLET BY MOUTH ONE TIME PER WEEK, Disp: 6 tablet, Rfl: 2   fluticasone (FLONASE) 50 MCG/ACT nasal spray, Place 1 spray into both nostrils daily as needed for allergies. , Disp: , Rfl:    furosemide (LASIX) 20 MG tablet, As needed patient may take 20 MG Lasix PRN by mouth QD x 4 days as directed per [facility] HF Study PRN plan. PLEASE DO NOT REMOVE. PT IS IN RESEARCH STUDY., Disp: 90 tablet, Rfl: 3   furosemide (LASIX) 40 MG tablet, Take one tablet daily, Disp: 90 tablet, Rfl: 3   gabapentin (NEURONTIN) 300 MG capsule, TAKE 2 CAPSULES BY MOUTH AT BEDTIME, Disp: 180 capsule, Rfl: 1   glucose blood test strip, Use to check CBG AC and QHS., Disp: 200 each, Rfl: 12   KLOR-CON M20 20 MEQ tablet, TAKE 1 TABLET BY MOUTH EVERY DAY, Disp:  90 tablet, Rfl: 1   losartan (COZAAR) 25 MG tablet, TAKE 1 TABLET BY MOUTH TWICE A DAY, Disp: 180 tablet, Rfl: 2   metFORMIN (GLUCOPHAGE) 500 MG tablet, Take 500 mg by mouth daily., Disp: , Rfl:    nebivolol (BYSTOLIC) 2.5 MG tablet, TAKE 1 TABLET BY MOUTH EVERY DAY, Disp: 90 tablet, Rfl: 3   OneTouch Delica Lancets 24Q MISC, 1 each by Does not apply route daily., Disp: 100 each, Rfl: 1   pantoprazole (PROTONIX) 40 MG tablet, TAKE 1 TABLET BY MOUTH EVERY DAY, Disp: 90 tablet, Rfl: 3   potassium chloride (KLOR-CON) 10 MEQ tablet, As needed patient may take 10 mEq PRN by mouth QD x 4 days as directed per [facility] HF Study PRN plan. PLEASE DO NOT REMOVE, Disp: 90 tablet, Rfl: 3   spironolactone (ALDACTONE) 25 MG tablet, Take 25 mg by mouth daily. Take 1/2 tablet on the days that you are taking 2 of the furosemide tablets., Disp: , Rfl:    traMADol (ULTRAM) 50 MG tablet, Take 50 mg by mouth as needed for moderate pain., Disp: , Rfl:    XARELTO 20 MG TABS tablet, TAKE 1 TABLET BY MOUTH EVERY DAY WITH SUPPER, Disp: 90 tablet, Rfl: 1

## 2022-07-15 NOTE — Discharge Instructions (Addendum)
You have been evaluated for your chest discomfort.  CT scan shows enlarged lymph nodes within your chest concerning for reoccurrence of your cancer.  It is important for you to call tomorrow and follow-up closely with your cancer doctor for further evaluation.  You may benefit from a PET scan for further assessment.  Please take pain medication as needed for pain control, return if you have any concern.

## 2022-07-15 NOTE — ED Notes (Signed)
Pt given ice water, per Zenia Resides - RN

## 2022-07-16 ENCOUNTER — Inpatient Hospital Stay: Payer: Medicare Other | Attending: Hematology and Oncology | Admitting: Hematology and Oncology

## 2022-07-16 VITALS — BP 126/55 | HR 84 | Temp 97.7°F | Resp 18 | Ht 66.0 in | Wt 219.2 lb

## 2022-07-16 DIAGNOSIS — Z9011 Acquired absence of right breast and nipple: Secondary | ICD-10-CM | POA: Insufficient documentation

## 2022-07-16 DIAGNOSIS — Z79811 Long term (current) use of aromatase inhibitors: Secondary | ICD-10-CM | POA: Insufficient documentation

## 2022-07-16 DIAGNOSIS — Z79899 Other long term (current) drug therapy: Secondary | ICD-10-CM | POA: Insufficient documentation

## 2022-07-16 DIAGNOSIS — Z170 Estrogen receptor positive status [ER+]: Secondary | ICD-10-CM | POA: Diagnosis not present

## 2022-07-16 DIAGNOSIS — C50112 Malignant neoplasm of central portion of left female breast: Secondary | ICD-10-CM | POA: Diagnosis not present

## 2022-07-16 NOTE — Addendum Note (Signed)
Addended by: Gardiner Coins L on: 07/16/2022 11:34 AM   Modules accepted: Orders

## 2022-07-16 NOTE — Assessment & Plan Note (Signed)
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 Nonspecific pulmonary nodule left upper lobe 3 mm  Oncotype testing could not be performed because it was felt that the tumor was a recurrent cancer and Oncotype DX has no ability in this situation.   Recommendation: adjuvant antiestrogen therapy with letrozole 2.5 mg dailystarted 11/14/2017-discontinued December 2022 because of arthralgias (I offered her systemic chemotherapy but patient declined)  Chest pain: Emergency room 07/15/2022: CT angiogram: Hilar and mediastinal lymph nodes and a T6 vertebral body abnormality concerning for metastatic disease.  Treatment plan: 1.  PET CT scan to evaluate for metastatic disease. 2. I requested Dr. Valeta Harms to see the patient for consideration for bronchoscopy and biopsy.  Return to clinic after PET/CT scan to discuss results.

## 2022-07-16 NOTE — Progress Notes (Signed)
Patient Care Team: Isaac Bliss, Rayford Halsted, MD as PCP - General (Internal Medicine) Sueanne Margarita, MD as PCP - Cardiology (Cardiology) Viona Gilmore, [facility] as Pharmacist (Pharmacist)  DIAGNOSIS:  Encounter Diagnosis  Name Primary?   Malignant neoplasm of central portion of left breast in female, estrogen receptor positive ([place]) Yes    SUMMARY OF ONCOLOGIC HISTORY: Oncology History  Malignant neoplasm of central portion of left breast in female, estrogen receptor positive ([place])  2002 Initial Diagnosis   Left breast cancer treated with lumpectomy followed by adjuvant chemotherapy (does not remember chemotherapy drugs) and radiation.  Did not take antiestrogen therapy   07/11/2017 Relapse/Recurrence   Left breast skin papule; biopsy proven to be invasive mammary carcinoma ER 100% PR 90% positive HER-2 negative ratio 1.64; Right breast 1.5 cm indeterminate calcifications biopsy proven to be complex sclerosing lesion   08/15/2017 Surgery   Left breast excision: Invasive carcinoma involving the dermis, ER 100%, PR 90%, HER-2 negative ratio 1.64.  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 Cynthia Herrera presents to the clinic today for a follow-up to discuss scans. Cynthia Herrera reports whole body pain and tightness in chest that started night before last.      ALLERGIES:  is allergic to tikosyn [dofetilide], zolpidem, codeine, codeine phosphate, and methylprednisolone.  MEDICATIONS:  Current Outpatient Medications   Medication Sig Dispense Refill   atorvastatin (LIPITOR) 40 MG tablet TAKE 1 TABLET BY MOUTH EVERYDAY AT BEDTIME 90 tablet 1   blood glucose meter kit and supplies KIT Dispense based on patient and insurance preference. Use up to four times daily as directed. 1 each 0   Cholecalciferol (VITAMIN D3) 2000 units capsule Take 2,000 Units by mouth daily.     clotrimazole-betamethasone (LOTRISONE) cream Apply 1 application topically 2 (two) times daily. 30 g 0   colchicine 0.6 MG tablet TAKE 1 TABLET BY MOUTH 2 TIMES DAILY FOR 21 DAYS. 42 tablet 1   cyclobenzaprine (FLEXERIL) 5 MG tablet Take 1 tablet (5 mg total) by mouth at bedtime as needed for muscle spasms. 30 tablet 0   dapagliflozin propanediol (FARXIGA) 10 MG TABS tablet Take 1 tablet (10 mg total) by mouth daily before breakfast. 90 tablet 1   fexofenadine (ALLEGRA) 180 MG tablet Take 180 mg by mouth daily as needed for allergies or rhinitis.     fluconazole (DIFLUCAN) 150 MG tablet TAKE 1 TABLET BY MOUTH ONE TIME PER WEEK 6 tablet 2   fluticasone (FLONASE) 50 MCG/ACT nasal spray Place 1 spray into both nostrils daily as needed for allergies.      furosemide (LASIX) 20 MG tablet As needed patient may take 20 MG Lasix PRN by mouth QD x 4 days as directed per [employer] HF Study PRN plan. PLEASE DO NOT REMOVE. PT IS IN RESEARCH STUDY. 90 tablet 3   furosemide (LASIX) 40 MG tablet Take one tablet daily 90 tablet 3   gabapentin (NEURONTIN) 300 MG capsule TAKE 2 CAPSULES BY MOUTH AT BEDTIME 180 capsule 1  glucose blood test strip Use to check CBG AC and QHS. 200 each 12   HYDROcodone-acetaminophen (NORCO/VICODIN) 5-325 MG tablet Take 1 tablet by mouth every 6 (six) hours as needed for moderate pain or severe pain. 10 tablet 0   KLOR-CON M20 20 MEQ tablet TAKE 1 TABLET BY MOUTH EVERY DAY 90 tablet 1   losartan (COZAAR) 25 MG tablet TAKE 1 TABLET BY MOUTH TWICE A DAY 180 tablet 2   metFORMIN (GLUCOPHAGE) 500 MG tablet Take 500 mg by mouth  daily.     nebivolol (BYSTOLIC) 2.5 MG tablet TAKE 1 TABLET BY MOUTH EVERY DAY 90 tablet 3   OneTouch Delica Lancets 85I MISC 1 each by Does not apply route daily. 100 each 1   pantoprazole (PROTONIX) 40 MG tablet TAKE 1 TABLET BY MOUTH EVERY DAY 90 tablet 3   potassium chloride (KLOR-CON) 10 MEQ tablet As needed patient may take 10 mEq PRN by mouth QD x 4 days as directed per [employer] HF Study PRN plan. PLEASE DO NOT REMOVE 90 tablet 3   spironolactone (ALDACTONE) 25 MG tablet Take 25 mg by mouth daily. Take 1/2 tablet on the days that you are taking 2 of the furosemide tablets.     traMADol (ULTRAM) 50 MG tablet Take 50 mg by mouth as needed for moderate pain.     XARELTO 20 MG TABS tablet TAKE 1 TABLET BY MOUTH EVERY DAY WITH SUPPER 90 tablet 1   No current facility-administered medications for this visit.    PHYSICAL EXAMINATION: ECOG PERFORMANCE STATUS: 1 - Symptomatic but completely ambulatory  Vitals:   07/16/22 1115  BP: (!) 126/55  Pulse: 84  Resp: 18  Temp: 97.7 F (36.5 C)  SpO2: 97%   Filed Weights   07/16/22 1115  Weight: 219 lb 3.2 oz (99.4 kg)     LABORATORY DATA:  I have reviewed the data as listed    Latest Ref Rng & Units 07/15/2022    4:33 PM 07/01/2022    4:37 PM 03/17/2022   10:45 AM  CMP  Glucose 70 - 99 mg/dL 147  112  179   BUN 8 - 23 mg/dL 30  32  28   Creatinine 0.44 - 1.00 mg/dL 1.17  1.32  1.06   Sodium 135 - 145 mmol/L 138  140  140   Potassium 3.5 - 5.1 mmol/L 4.4  4.3  4.4   Chloride 98 - 111 mmol/L 103  102  104   CO2 22 - 32 mmol/L $RemoveB'20  19  19   'LKifqMvr$ Calcium 8.9 - 10.3 mg/dL 10.4  10.1  9.6     Lab Results  Component Value Date   WBC 11.4 (H) 07/15/2022   HGB 11.6 (L) 07/15/2022   HCT 34.6 (L) 07/15/2022   MCV 86.3 07/15/2022   PLT 341 07/15/2022   NEUTROABS 5.4 12/16/2021    ASSESSMENT & PLAN:  Malignant neoplasm of central portion of left breast in female, estrogen receptor positive ([place]) 10/27/2017: Left simple mastectomy:  Recurrent multifocal IDC grade 2 largest spanning 3.5 cm, 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  Nonspecific pulmonary nodule left upper lobe 3 mm   Oncotype  testing could not be performed because it was felt that the tumor was a recurrent cancer and Oncotype DX has no ability in this situation.    Recommendation: adjuvant antiestrogen therapy with letrozole 2.5 mg daily started 11/14/2017-discontinued December 2022 because of arthralgias (I offered her systemic chemotherapy but patient declined)  Chest pain: Emergency room 07/15/2022: CT angiogram: Hilar and mediastinal lymph nodes and a T6 vertebral body abnormality concerning for metastatic disease.  Treatment plan: 1.  PET CT scan to evaluate for metastatic disease. 2. I requested Dr. Valeta Harms to see the patient for consideration for bronchoscopy and biopsy.  Return to clinic after PET/CT scan to discuss results.     Orders Placed This Encounter  Procedures   NM PET Image Restag (PS) Skull Base To Thigh    Standing Status:   Future    Standing Expiration Date:   07/16/2023    Order Specific Question:   If indicated for the ordered procedure, I authorize the administration of a radiopharmaceutical per Radiology protocol    Answer:   Yes    Order Specific Question:   Preferred imaging location?    Answer:   [place]   Ambulatory referral to Pulmonology    Referral Priority:   Routine    Referral Type:   Consultation    Referral Reason:   Specialty Services Required    Referred to Provider:   Garner Nash, DO    Requested Specialty:   Pulmonary Disease    Number of Visits Requested:   1    The patient has a good understanding of the overall plan. Cynthia Herrera agrees with it. Cynthia Herrera will call with any problems that may develop before the next visit here. Total time spent: 30 mins including face to face time and time spent for planning, charting and co-ordination of care   Harriette Ohara, MD 07/16/22    I Gardiner Coins am scribing for Dr. Lindi Adie  I have reviewed the above documentation for accuracy and completeness, and I agree with the above.

## 2022-07-18 ENCOUNTER — Other Ambulatory Visit: Payer: Self-pay | Admitting: Cardiology

## 2022-07-19 ENCOUNTER — Telehealth: Payer: Medicare Other | Admitting: Internal Medicine

## 2022-07-19 ENCOUNTER — Telehealth: Payer: Self-pay | Admitting: Hematology and Oncology

## 2022-07-19 ENCOUNTER — Ambulatory Visit: Payer: Medicare Other | Admitting: Pulmonary Disease

## 2022-07-19 ENCOUNTER — Encounter: Payer: Self-pay | Admitting: Pulmonary Disease

## 2022-07-19 VITALS — BP 130/70 | HR 77 | Ht 66.0 in | Wt 218.8 lb

## 2022-07-19 DIAGNOSIS — Z853 Personal history of malignant neoplasm of breast: Secondary | ICD-10-CM

## 2022-07-19 DIAGNOSIS — R918 Other nonspecific abnormal finding of lung field: Secondary | ICD-10-CM

## 2022-07-19 DIAGNOSIS — R590 Localized enlarged lymph nodes: Secondary | ICD-10-CM | POA: Diagnosis not present

## 2022-07-19 NOTE — Progress Notes (Signed)
Synopsis: Referred in September 2023 for pulmonary nodule by Isaac Bliss, [name]  Subjective:   PATIENT ID: Cynthia Herrera GENDER: female DOB: 1945/10/27, MRN: [id number]  Chief Complaint  Patient presents with   Follow-up    This is a 76 year old female, past medical history of left breast cancer treated with chemoradiation in 2002And a left mastectomy in 2018.  Patient has chronic systolic heart failure, COPD, history of a dilated cardiomyopathy and a repeat echo with improvement of EF.  OSA, atrial fibrillation.Patient of Dr. Chase Caller.  Also of Dr. Lindi Adie from medical oncology.  Was referred here for evaluation after CT scan of the chest on 07/15/2022.  There was enlarged mediastinal lymph nodes in both the mediastinum and hilum concerning for metastatic disease.  Eggs well as a lesion potentially within the T6 vertebra.  Patient was referred for evaluation of bronchoscopy and tissue sampling.   Oncology History  Malignant neoplasm of central portion of left breast in female, estrogen receptor positive ([place])  2002 Initial Diagnosis   Left breast cancer treated with lumpectomy followed by adjuvant chemotherapy (does not remember chemotherapy drugs) and radiation.  Did not take antiestrogen therapy   07/11/2017 Relapse/Recurrence   Left breast skin papule; biopsy proven to be invasive mammary carcinoma ER 100% PR 90% positive HER-2 negative ratio 1.64; Right breast 1.5 cm indeterminate calcifications biopsy proven to be complex sclerosing lesion   08/15/2017 Surgery   Left breast excision: Invasive carcinoma involving the dermis, ER 100%, PR 90%, HER-2 negative ratio 1.64.  Right lumpectomy: Complex sclerosing lesion    10/27/2017 Surgery   Left simple mastectomy: Recurrent multifocal IDC grade 2 largest spanning 3.5 cm, low to high-grade DCIS, tumor involves nipple and epidermis, margins negative, left chest wall excision IDC involving the anterior margin ER 100%, PR 90%, HER-2  negative; T4 Nx stage 3b   11/14/2017 -  Anti-estrogen oral therapy   Letrozole 2.5 mg daily      Past Medical History:  Diagnosis Date   Arthritis    Breast cancer, left breast ([place]) 2002   "then treated w/chemo and radiation"   Breast cancer, left breast ([place]) 07/14/2017   "tx'd w/mastectomy"   Chronic back pain    h/o lumbar stenosis; "no problem since my OR" (`10/27/2017)   Chronic systolic CHF (congestive heart failure) (HCC)    Complication of anesthesia    last surgery iv med when going to sleep"burned as injected"   COPD (chronic obstructive pulmonary disease) (HCC)    no inhalers    DCM (dilated cardiomyopathy) ([place])    EF 15-20% ? tachycardia induced - EF 35-40% by echo 05/2018   Dyspnea    with exertion   Gallstones    GERD (gastroesophageal reflux disease)    once in a while;depends on what she eats   History of bronchitis    "chronic when I smoked; no problem since I quit in 2002" (10/27/2017)   Hyperlipidemia    Hypertension    takes Losartan and Metoprolol.    Joint pain    Muscle spasm    takes Flexeril daily as needed    Nocturia    OSA (obstructive sleep apnea) 08/22/2018   Severe obstructive sleep apnea with an AHI 47.3/h and no significant central sleep apnea with moderate oxygen desaturations as low as 79%   Osteopenia    Persistent atrial fibrillation ([place])    s/p TEE DCCV and repeat DCCV 11/14/2013 and 02/2018   Personal history of colonic polyps  adenomas 03 and 08   Pneumonia    hx   Seasonal allergies    Thyroid nodule    Type II diabetes mellitus (HCC)    Vitamin D deficiency    takes Vit D     Family History  Problem Relation Age of Onset   Dementia Sister    Diabetes Sister    Alzheimer's disease Sister    Diabetes Brother        x 2   Heart disease Brother    Irritable bowel syndrome Brother    Liver cancer Mother    Diabetes Mother    Pancreatic cancer Mother    Diabetes Father    Heart disease Father      Past Surgical  History:  Procedure Laterality Date   ATRIAL FIBRILLATION ABLATION N/A 12/12/2018   Procedure: ATRIAL FIBRILLATION ABLATION;  Surgeon: Thompson Grayer, MD;  Location: [facility];  Service: Cardiovascular;  Laterality: N/A;   AXILLARY LYMPH NODE DISSECTION Left 10/2001   persistent intramammary node /notes 03/09/2011   BACK SURGERY     BREAST BIOPSY Left 2002   BREAST BIOPSY Right 06/2017   "node bx was negative"   BREAST BIOPSY Left 06/2017   "positive for cancer"   BREAST LUMPECTOMY WITH RADIOACTIVE SEED LOCALIZATION Right 08/15/2017   Procedure: RIGHT BREAST LUMPECTOMY WITH RADIOACTIVE SEED LOCALIZATION;  Surgeon: Jovita Kussmaul, MD;  Location: [facility];  Service: General;  Laterality: Right;   CARDIAC CATHETERIZATION  2015   CARDIOVERSION N/A 11/12/2013   Procedure: CARDIOVERSION;  Surgeon: Thayer Headings, MD;  Location: [place];  Service: Cardiovascular;  Laterality: N/A;  10:08  Dr. Marissa Nestle, anesthesia present, [place]   '60mg'$ ,  propofol $RemoveB'50mg'ORbRjrfl$ , IV for elective cardioversion....Dr. Cathie Olden delievered synch 120 joules with successful cardioversion to NSR   CARDIOVERSION N/A 11/12/2013   Procedure: CARDIOVERSION;  Surgeon: Thayer Headings, MD;  Location: [facility];  Service: Cardiovascular;  Laterality: N/A;   CARDIOVERSION N/A 11/14/2013   Procedure: CARDIOVERSION (BEDSIDE);  Surgeon: Larey Dresser, MD;  Location: [facility];  Service: Cardiovascular;  Laterality: N/A;   CARDIOVERSION N/A 02/22/2018   Procedure: CARDIOVERSION;  Surgeon: Sueanne Margarita, MD;  Location: [facility];  Service: Cardiovascular;  Laterality: N/A;   CATARACT EXTRACTION W/ INTRAOCULAR LENS  IMPLANT, BILATERAL Bilateral ~ 2016   COLONOSCOPY  2003, September 2008, April 01, 2011   adenomas 03 and 08, polyp 12, diverticulosis   COLONOSCOPY WITH PROPOFOL N/A 11/16/2016   Procedure: COLONOSCOPY WITH PROPOFOL;  Surgeon: Gatha Mayer, MD;  Location: [facility];  Service: Endoscopy;  Laterality: N/A;    implantable loop recorder placement  03/17/2022   Medtronic Reveal Linq model LNQ 11 (SN [id number]) implantable loop recorder implanted for Alleviate HF trial   JOINT REPLACEMENT     LAPAROSCOPIC CHOLECYSTECTOMY  2004   LEFT AND RIGHT HEART CATHETERIZATION WITH CORONARY ANGIOGRAM N/A 02/01/2014   Procedure: LEFT AND RIGHT HEART CATHETERIZATION WITH CORONARY ANGIOGRAM;  Surgeon: Sueanne Margarita, MD;  Location: [place] [facility];  Service: Cardiovascular;  Laterality: N/A;   LUMBAR LAMINECTOMY/DECOMPRESSION MICRODISCECTOMY N/A 03/19/2014   Procedure: LUMBAR LAMINECTOMY/DECOMPRESSION MICRODISCECTOMY 4 LEVEL;  Surgeon: Kristeen Miss, MD;  Location: [place] [facility];  Service: Neurosurgery;  Laterality: N/A;  L1-2 L2-3 L3-4 L4-5 Laminectomy/Foraminotomy   MASS EXCISION Left 08/15/2017   Procedure: EXCISION OF LEFT BREAST MASS;  Surgeon: Jovita Kussmaul, MD;  Location: [facility];  Service: General;  Laterality: Left;   MASTECTOMY Left 10/27/2017   MASTECTOMY  PARTIAL / LUMPECTOMY W/ AXILLARY LYMPHADENECTOMY  05/08/2001   Archie Endo 03/09/2011   PORT-A-CATH REMOVAL  2003   PORTA CATH INSERTION  2002   TEE WITHOUT CARDIOVERSION N/A 11/12/2013   Procedure: TRANSESOPHAGEAL ECHOCARDIOGRAM (TEE);  Surgeon: Thayer Headings, MD;  Location: [place];  Service: Cardiovascular;  Laterality: N/A;  pt b/p low, pt buccal membranes very dry, lips scapped, pt c/o thirst. NPO since MN and iv FLUIDS TOTAL INFUSING AT TOTAL 20ML HR....  Dr. Cathie Olden order allow NS to bolus during procedure....very dry,NS bolus 250 ml total..pt responding well to meds.Marland Kitchen   TOTAL KNEE ARTHROPLASTY Left 2006   TOTAL KNEE ARTHROPLASTY Right 05/10/2016   Procedure: RIGHT TOTAL KNEE ARTHROPLASTY;  Surgeon: Gaynelle Arabian, MD;  Location: [facility];  Service: Orthopedics;  Laterality: Right;  With adductor block   TOTAL MASTECTOMY Left 10/27/2017   Procedure: LEFT MASTECTOMY;  Surgeon: Jovita Kussmaul, MD;  Location: [facility];  Service: General;  Laterality: Left;    TUBAL LIGATION      Social History   Socioeconomic History   Marital status: Widowed    Spouse name: Not on file   Number of children: 2   Years of education: Not on file   Highest education level: Not on file  Occupational History   Occupation: retired  Tobacco Use   Smoking status: Former    Packs/day: 1.50    Years: 35.00    Total pack years: 52.50    Types: Cigarettes    Quit date: 08/24/2001    Years since quitting: 20.9   Smokeless tobacco: Never  Vaping Use   Vaping Use: Never used  Substance and Sexual Activity   Alcohol use: No   Drug use: No   Sexual activity: Never    Birth control/protection: Post-menopausal  Other Topics Concern   Not on file  Social History Narrative   Not on file   Social Determinants of Health   Financial Resource Strain: Low Risk  (04/24/2020)   Overall Financial Resource Strain (CARDIA)    Difficulty of Paying Living Expenses: Not very hard  Food Insecurity: Not on file  Transportation Needs: Not on file  Physical Activity: Inactive (04/24/2020)   Exercise Vital Sign    Days of Exercise per Week: 0 days    Minutes of Exercise per Session: 0 min  Stress: Not on file  Social Connections: Not on file  Intimate Partner Violence: Not on file     Allergies  Allergen Reactions   Tikosyn [Dofetilide]     Prolonged QT/QTc   Zolpidem Other (See Comments)    Hallucinations, up walking around ? Hallucinations, up walking around   Codeine Other (See Comments)   Codeine Phosphate Nausea And Vomiting   Methylprednisolone Other (See Comments)    Felt really weird w the high dose oral steroid, but tolerates low doses or oral steroid Felt really weird w the high dose oral steroid, but tolerates low doses or oral steroid     Outpatient Medications Prior to Visit  Medication Sig Dispense Refill   fexofenadine (ALLEGRA) 180 MG tablet Take 180 mg by mouth daily as needed for allergies or rhinitis.     fluticasone (FLONASE) 50 MCG/ACT nasal  spray Place 1 spray into both nostrils daily as needed for allergies.      atorvastatin (LIPITOR) 40 MG tablet TAKE 1 TABLET BY MOUTH EVERYDAY AT BEDTIME 90 tablet 1   blood glucose meter kit and supplies KIT Dispense based on patient and insurance preference.  Use up to four times daily as directed. 1 each 0   Cholecalciferol (VITAMIN D3) 2000 units capsule Take 2,000 Units by mouth daily.     clotrimazole-betamethasone (LOTRISONE) cream Apply 1 application topically 2 (two) times daily. 30 g 0   colchicine 0.6 MG tablet TAKE 1 TABLET BY MOUTH 2 TIMES DAILY FOR 21 DAYS. 42 tablet 1   cyclobenzaprine (FLEXERIL) 5 MG tablet Take 1 tablet (5 mg total) by mouth at bedtime as needed for muscle spasms. 30 tablet 0   dapagliflozin propanediol (FARXIGA) 10 MG TABS tablet Take 1 tablet (10 mg total) by mouth daily before breakfast. 90 tablet 1   fluconazole (DIFLUCAN) 150 MG tablet TAKE 1 TABLET BY MOUTH ONE TIME PER WEEK 6 tablet 2   furosemide (LASIX) 20 MG tablet As needed patient may take 20 MG Lasix PRN by mouth QD x 4 days as directed per [employer] HF Study PRN plan. PLEASE DO NOT REMOVE. PT IS IN RESEARCH STUDY. 90 tablet 3   furosemide (LASIX) 40 MG tablet Take one tablet daily 90 tablet 3   gabapentin (NEURONTIN) 300 MG capsule TAKE 2 CAPSULES BY MOUTH AT BEDTIME 180 capsule 1   glucose blood test strip Use to check CBG AC and QHS. 200 each 12   HYDROcodone-acetaminophen (NORCO/VICODIN) 5-325 MG tablet Take 1 tablet by mouth every 6 (six) hours as needed for moderate pain or severe pain. 10 tablet 0   KLOR-CON M20 20 MEQ tablet TAKE 1 TABLET BY MOUTH EVERY DAY 90 tablet 1   losartan (COZAAR) 25 MG tablet TAKE 1 TABLET BY MOUTH TWICE A DAY 180 tablet 2   metFORMIN (GLUCOPHAGE) 500 MG tablet Take 500 mg by mouth daily.     nebivolol (BYSTOLIC) 2.5 MG tablet TAKE 1 TABLET BY MOUTH EVERY DAY 90 tablet 3   OneTouch Delica Lancets 53I MISC 1 each by Does not apply route daily. 100 each 1    pantoprazole (PROTONIX) 40 MG tablet TAKE 1 TABLET BY MOUTH EVERY DAY 90 tablet 3   potassium chloride (KLOR-CON) 10 MEQ tablet As needed patient may take 10 mEq PRN by mouth QD x 4 days as directed per [employer] HF Study PRN plan. PLEASE DO NOT REMOVE 90 tablet 3   spironolactone (ALDACTONE) 25 MG tablet Take 25 mg by mouth daily. Take 1/2 tablet on the days that you are taking 2 of the furosemide tablets.     traMADol (ULTRAM) 50 MG tablet Take 50 mg by mouth as needed for moderate pain.     XARELTO 20 MG TABS tablet TAKE 1 TABLET BY MOUTH EVERY DAY WITH SUPPER 90 tablet 1   No facility-administered medications prior to visit.    Review of Systems  Constitutional:  Negative for chills, fever, malaise/fatigue and weight loss.  HENT:  Negative for hearing loss, sore throat and tinnitus.   Eyes:  Negative for blurred vision and double vision.  Respiratory:  Negative for cough, hemoptysis, sputum production, shortness of breath, wheezing and stridor.   Cardiovascular:  Negative for chest pain, palpitations, orthopnea, leg swelling and PND.  Gastrointestinal:  Negative for abdominal pain, constipation, diarrhea, heartburn, nausea and vomiting.  Genitourinary:  Negative for dysuria, hematuria and urgency.  Musculoskeletal:  Negative for joint pain and myalgias.  Skin:  Negative for itching and rash.  Neurological:  Negative for dizziness, tingling, weakness and headaches.  Endo/Heme/Allergies:  Negative for environmental allergies. Does not bruise/bleed easily.  Psychiatric/Behavioral:  Negative for depression. The patient  is not nervous/anxious and does not have insomnia.   All other systems reviewed and are negative.    Objective:  Physical Exam Vitals reviewed.  Constitutional:      General: She is not in acute distress.    Appearance: She is well-developed.  HENT:     Head: Normocephalic and atraumatic.  Eyes:     General: No scleral icterus.    Conjunctiva/sclera:  Conjunctivae normal.     Pupils: Pupils are equal, round, and reactive to light.  Neck:     Vascular: No JVD.     Trachea: No tracheal deviation.  Cardiovascular:     Rate and Rhythm: Normal rate and regular rhythm.     Heart sounds: Normal heart sounds. No murmur heard. Pulmonary:     Effort: Pulmonary effort is normal. No tachypnea, accessory muscle usage or respiratory distress.     Breath sounds: No stridor. No wheezing, rhonchi or rales.  Abdominal:     General: There is no distension.     Palpations: Abdomen is soft.     Tenderness: There is no abdominal tenderness.  Musculoskeletal:        General: No tenderness.     Cervical back: Neck supple.  Lymphadenopathy:     Cervical: No cervical adenopathy.  Skin:    General: Skin is warm and dry.     Capillary Refill: Capillary refill takes less than 2 seconds.     Findings: No rash.  Neurological:     Mental Status: She is alert and oriented to person, place, and time.  Psychiatric:        Behavior: Behavior normal.      Vitals:   07/19/22 1120  BP: 130/70  Pulse: 77  SpO2: 91%  Weight: 218 lb 12.8 oz (99.2 kg)  Height: 5\' 6"  (1.676 m)   91% on RA BMI Readings from Last 3 Encounters:  07/19/22 35.32 kg/m  07/16/22 35.38 kg/m  07/15/22 34.70 kg/m   Wt Readings from Last 3 Encounters:  07/19/22 218 lb 12.8 oz (99.2 kg)  07/16/22 219 lb 3.2 oz (99.4 kg)  07/15/22 215 lb (97.5 kg)     CBC    Component Value Date/Time   WBC 11.4 (H) 07/15/2022 1633   RBC 4.01 07/15/2022 1633   HGB 11.6 (L) 07/15/2022 1633   HGB 11.8 07/01/2022 1637   HGB 12.8 12/10/2009 1401   HCT 34.6 (L) 07/15/2022 1633   HCT 34.3 07/01/2022 1637   HCT 37.3 12/10/2009 1401   PLT 341 07/15/2022 1633   PLT 367 07/01/2022 1637   MCV 86.3 07/15/2022 1633   MCV 85 07/01/2022 1637   MCV 89.0 12/10/2009 1401   MCH 28.9 07/15/2022 1633   MCHC 33.5 07/15/2022 1633   RDW 14.7 07/15/2022 1633   RDW 13.2 07/01/2022 1637   RDW 12.6  12/10/2009 1401   LYMPHSABS 1.1 12/16/2021 1432   LYMPHSABS 1.4 12/10/2009 1401   MONOABS 0.8 02/03/2017 1207   MONOABS 0.5 12/10/2009 1401   EOSABS 1.3 (H) 12/16/2021 1432   BASOSABS 0.1 12/16/2021 1432   BASOSABS 0.0 12/10/2009 1401     Chest Imaging: 07/15/2022 CT chest: Adenopathy concerning for metastatic disease, T6 lesion. The patient's images have been independently reviewed by me.    Pulmonary Functions Testing Results:    Latest Ref Rng & Units 05/11/2022    1:20 PM 02/23/2021    3:58 PM 04/18/2019    3:48 PM 08/24/2018    8:49 AM 12/05/2014  1:03 PM  PFT Results  FVC-Pre L 2.53  2.45  2.81  2.77  2.76   FVC-Predicted Pre % 83  79  89  87  81   FVC-Post L  2.60   2.71  2.72   FVC-Predicted Post %  84   85  80   Pre FEV1/FVC % % 81  78  84  83  85   Post FEV1/FCV % %  82   86  84   FEV1-Pre L 2.05  1.91  2.35  2.28  2.34   FEV1-Predicted Pre % 90  82  99  95  90   FEV1-Post L  2.13   2.32  2.30   DLCO uncorrected ml/min/mmHg 16.62  16.04  17.52  15.65  18.54   DLCO UNC% % 81  78  85  58  65   DLCO corrected ml/min/mmHg 16.62  16.04      DLCO COR %Predicted % 81  78      DLVA Predicted % 95  97  110  73  82   TLC L  4.59   4.56    TLC % Predicted %  85   85    RV % Predicted %  88   84      FeNO:   Pathology:   Echocardiogram:   Heart Catheterization:     Assessment & Plan:     ICD-10-CM   1. Mediastinal adenopathy  R59.0     2. Hilar adenopathy  R59.0     3. History of left breast cancer  Z85.3       Discussion:  This is a 76 year old female, history of breast cancer.  She has new adenopathy within the chest.  She has had recurrence of breast cancer x2.  Plan: We will set her up with video bronchoscopy with endobronchial ultrasound transbronchial needle aspiration. Patient is agreeable to proceed. We will have her hold her Xarelto 2 days prior.  Last dose on 14 October. Tentative bronchoscopy date on 08/10/2022. Patient wants to get her PET  scan complete prior to that. I will reach out to oncology office to help facilitate getting her PET scan scheduled prior to the procedure.    Current Outpatient Medications:    fexofenadine (ALLEGRA) 180 MG tablet, Take 180 mg by mouth daily as needed for allergies or rhinitis., Disp: , Rfl:    fluticasone (FLONASE) 50 MCG/ACT nasal spray, Place 1 spray into both nostrils daily as needed for allergies. , Disp: , Rfl:    atorvastatin (LIPITOR) 40 MG tablet, TAKE 1 TABLET BY MOUTH EVERYDAY AT BEDTIME, Disp: 90 tablet, Rfl: 1   blood glucose meter kit and supplies KIT, Dispense based on patient and insurance preference. Use up to four times daily as directed., Disp: 1 each, Rfl: 0   Cholecalciferol (VITAMIN D3) 2000 units capsule, Take 2,000 Units by mouth daily., Disp: , Rfl:    clotrimazole-betamethasone (LOTRISONE) cream, Apply 1 application topically 2 (two) times daily., Disp: 30 g, Rfl: 0   colchicine 0.6 MG tablet, TAKE 1 TABLET BY MOUTH 2 TIMES DAILY FOR 21 DAYS., Disp: 42 tablet, Rfl: 1   cyclobenzaprine (FLEXERIL) 5 MG tablet, Take 1 tablet (5 mg total) by mouth at bedtime as needed for muscle spasms., Disp: 30 tablet, Rfl: 0   dapagliflozin propanediol (FARXIGA) 10 MG TABS tablet, Take 1 tablet (10 mg total) by mouth daily before breakfast., Disp: 90 tablet, Rfl: 1   fluconazole (DIFLUCAN) 150 MG  tablet, TAKE 1 TABLET BY MOUTH ONE TIME PER WEEK, Disp: 6 tablet, Rfl: 2   furosemide (LASIX) 20 MG tablet, As needed patient may take 20 MG Lasix PRN by mouth QD x 4 days as directed per [employer] HF Study PRN plan. PLEASE DO NOT REMOVE. PT IS IN RESEARCH STUDY., Disp: 90 tablet, Rfl: 3   furosemide (LASIX) 40 MG tablet, Take one tablet daily, Disp: 90 tablet, Rfl: 3   gabapentin (NEURONTIN) 300 MG capsule, TAKE 2 CAPSULES BY MOUTH AT BEDTIME, Disp: 180 capsule, Rfl: 1   glucose blood test strip, Use to check CBG AC and QHS., Disp: 200 each, Rfl: 12   HYDROcodone-acetaminophen  (NORCO/VICODIN) 5-325 MG tablet, Take 1 tablet by mouth every 6 (six) hours as needed for moderate pain or severe pain., Disp: 10 tablet, Rfl: 0   KLOR-CON M20 20 MEQ tablet, TAKE 1 TABLET BY MOUTH EVERY DAY, Disp: 90 tablet, Rfl: 1   losartan (COZAAR) 25 MG tablet, TAKE 1 TABLET BY MOUTH TWICE A DAY, Disp: 180 tablet, Rfl: 2   metFORMIN (GLUCOPHAGE) 500 MG tablet, Take 500 mg by mouth daily., Disp: , Rfl:    nebivolol (BYSTOLIC) 2.5 MG tablet, TAKE 1 TABLET BY MOUTH EVERY DAY, Disp: 90 tablet, Rfl: 3   OneTouch Delica Lancets 46K MISC, 1 each by Does not apply route daily., Disp: 100 each, Rfl: 1   pantoprazole (PROTONIX) 40 MG tablet, TAKE 1 TABLET BY MOUTH EVERY DAY, Disp: 90 tablet, Rfl: 3   potassium chloride (KLOR-CON) 10 MEQ tablet, As needed patient may take 10 mEq PRN by mouth QD x 4 days as directed per [employer] HF Study PRN plan. PLEASE DO NOT REMOVE, Disp: 90 tablet, Rfl: 3   spironolactone (ALDACTONE) 25 MG tablet, Take 25 mg by mouth daily. Take 1/2 tablet on the days that you are taking 2 of the furosemide tablets., Disp: , Rfl:    traMADol (ULTRAM) 50 MG tablet, Take 50 mg by mouth as needed for moderate pain., Disp: , Rfl:    XARELTO 20 MG TABS tablet, TAKE 1 TABLET BY MOUTH EVERY DAY WITH SUPPER, Disp: 90 tablet, Rfl: 1  I spent 62 minutes dedicated to the care of this patient on the date of this encounter to include pre-visit review of records, face-to-face time with the patient discussing conditions above, post visit ordering of testing, clinical documentation with the electronic health record, making appropriate referrals as documented, and communicating necessary findings to members of the patients care team.   Garner Nash, DO [facility] 07/19/2022 11:31 AM

## 2022-07-19 NOTE — Telephone Encounter (Signed)
Rescheduled appointment per 9/22 los.Patient is aware.

## 2022-07-19 NOTE — Patient Instructions (Signed)
Thank you for visiting Dr. Valeta Harms at [facility]. Today we recommend the following:  Orders Placed This Encounter  Procedures   Procedural/ Surgical Case Request: [place]   Ambulatory referral to Pulmonology   Bronchoscopy to be scheduled on Oct 17th Please Schedule PET scan prior to procedure Last dose 08/07/2022  Return in about 29 days (around 08/17/2022) for w/ , with Eric Form, NP, or Dr. Valeta Harms.    Please do your part to reduce the spread of COVID-19.

## 2022-07-22 ENCOUNTER — Other Ambulatory Visit: Payer: Self-pay | Admitting: Internal Medicine

## 2022-07-22 ENCOUNTER — Other Ambulatory Visit: Payer: Self-pay | Admitting: Cardiology

## 2022-07-22 DIAGNOSIS — E1140 Type 2 diabetes mellitus with diabetic neuropathy, unspecified: Secondary | ICD-10-CM

## 2022-07-22 DIAGNOSIS — E785 Hyperlipidemia, unspecified: Secondary | ICD-10-CM

## 2022-07-28 NOTE — Progress Notes (Signed)
Patient Care Team: Philip Aspen, Limmie Patricia, MD as PCP - General (Internal Medicine) Quintella Reichert, MD as PCP - Cardiology (Cardiology) Verner Chol, [facility] as Pharmacist (Pharmacist)  DIAGNOSIS:  Encounter Diagnosis  Name Primary?   Malignant neoplasm of central portion of left breast in female, estrogen receptor positive (HCC)     SUMMARY OF ONCOLOGIC HISTORY: Oncology History  Malignant neoplasm of central portion of left breast in female, estrogen receptor positive (HCC)  2002 Initial Diagnosis   Left breast cancer treated with lumpectomy followed by adjuvant chemotherapy (does not remember chemotherapy drugs) and radiation.  Did not take antiestrogen therapy   07/11/2017 Relapse/Recurrence   Left breast skin papule; biopsy proven to be invasive mammary carcinoma ER 100% PR 90% positive HER-2 negative ratio 1.64; Right breast 1.5 cm indeterminate calcifications biopsy proven to be complex sclerosing lesion   08/15/2017 Surgery   Left breast excision: Invasive carcinoma involving the dermis, ER 100%, PR 90%, HER-2 negative ratio 1.64.  Right lumpectomy: Complex sclerosing lesion    10/27/2017 Surgery   Left simple mastectomy: Recurrent multifocal IDC grade 2 largest spanning 3.5 cm, low to high-grade DCIS, tumor involves nipple and epidermis, margins negative, left chest wall excision IDC involving the anterior margin ER 100%, PR 90%, HER-2 negative; T4 Nx stage 3b   11/14/2017 -  Anti-estrogen oral therapy   Letrozole 2.5 mg daily     CHIEF COMPLIANT: Follow-up to review Pet scan  INTERVAL HISTORY: Cynthia Herrera is a 76 y.o with the above-mentioned. She presents to the clinic for a follow-up to review scans.  She is currently in a wheelchair because of severe back pain.  She does not have any pain when she is sitting or lying down but when she stands up she seems to get the pain.  She has not been able to be very physically active because of the pain with standing  up.    ALLERGIES:  is allergic to tikosyn [dofetilide], zolpidem, codeine, codeine phosphate, and methylprednisolone.  MEDICATIONS:  Current Outpatient Medications  Medication Sig Dispense Refill   atorvastatin (LIPITOR) 40 MG tablet TAKE 1 TABLET BY MOUTH EVERYDAY AT BEDTIME 90 tablet 3   blood glucose meter kit and supplies KIT Dispense based on patient and insurance preference. Use up to four times daily as directed. 1 each 0   Cholecalciferol (VITAMIN D3) 2000 units capsule Take 2,000 Units by mouth daily.     clotrimazole-betamethasone (LOTRISONE) cream Apply 1 application topically 2 (two) times daily. 30 g 0   colchicine 0.6 MG tablet TAKE 1 TABLET BY MOUTH 2 TIMES DAILY FOR 21 DAYS. 42 tablet 1   cyclobenzaprine (FLEXERIL) 5 MG tablet Take 1 tablet (5 mg total) by mouth at bedtime as needed for muscle spasms. 30 tablet 0   dapagliflozin propanediol (FARXIGA) 10 MG TABS tablet Take 1 tablet (10 mg total) by mouth daily before breakfast. 90 tablet 1   fexofenadine (ALLEGRA) 180 MG tablet Take 180 mg by mouth daily as needed for allergies or rhinitis.     fluconazole (DIFLUCAN) 150 MG tablet TAKE 1 TABLET BY MOUTH ONE TIME PER WEEK 6 tablet 2   fluticasone (FLONASE) 50 MCG/ACT nasal spray Place 1 spray into both nostrils daily as needed for allergies.      furosemide (LASIX) 20 MG tablet As needed patient may take 20 MG Lasix PRN by mouth QD x 4 days as directed per [employer] HF Study PRN plan. PLEASE DO NOT REMOVE. PT  IS IN RESEARCH STUDY. 90 tablet 3   furosemide (LASIX) 40 MG tablet Take one tablet daily 90 tablet 3   gabapentin (NEURONTIN) 300 MG capsule TAKE 2 CAPSULES BY MOUTH AT BEDTIME 180 capsule 1   glucose blood test strip Use to check CBG AC and QHS. 200 each 12   KLOR-CON M20 20 MEQ tablet TAKE 1 TABLET BY MOUTH EVERY DAY 90 tablet 1   losartan (COZAAR) 25 MG tablet TAKE 1 TABLET BY MOUTH TWICE A DAY 180 tablet 2   metFORMIN (GLUCOPHAGE) 500 MG tablet Take 500 mg  by mouth daily.     morphine (MSIR) 15 MG tablet Take 1 tablet (15 mg total) by mouth every 4 (four) hours as needed. 30 tablet 0   nebivolol (BYSTOLIC) 2.5 MG tablet TAKE 1 TABLET BY MOUTH EVERY DAY 90 tablet 3   OneTouch Delica Lancets 19E MISC 1 each by Does not apply route daily. 100 each 1   pantoprazole (PROTONIX) 40 MG tablet TAKE 1 TABLET BY MOUTH EVERY DAY 90 tablet 3   potassium chloride (KLOR-CON) 10 MEQ tablet As needed patient may take 10 mEq PRN by mouth QD x 4 days as directed per [employer] HF Study PRN plan. PLEASE DO NOT REMOVE 90 tablet 3   spironolactone (ALDACTONE) 25 MG tablet Take 25 mg by mouth daily. Take 1/2 tablet on the days that you are taking 2 of the furosemide tablets.     traMADol (ULTRAM) 50 MG tablet Take 50 mg by mouth as needed for moderate pain.     XARELTO 20 MG TABS tablet TAKE 1 TABLET BY MOUTH EVERY DAY WITH SUPPER 90 tablet 1   No current facility-administered medications for this visit.    PHYSICAL EXAMINATION: ECOG PERFORMANCE STATUS: 2 - Symptomatic, <50% confined to bed  Vitals:   08/02/22 1506  BP: (!) 104/57  Pulse: 70  Resp: 18  Temp: (!) 97.2 F (36.2 C)  SpO2: 96%   Filed Weights   08/02/22 1506  Weight: 215 lb 9.6 oz (97.8 kg)      LABORATORY DATA:  I have reviewed the data as listed    Latest Ref Rng & Units 07/15/2022    4:33 PM 07/01/2022    4:37 PM 03/17/2022   10:45 AM  CMP  Glucose 70 - 99 mg/dL 147  112  179   BUN 8 - 23 mg/dL 30  32  28   Creatinine 0.44 - 1.00 mg/dL 1.17  1.32  1.06   Sodium 135 - 145 mmol/L 138  140  140   Potassium 3.5 - 5.1 mmol/L 4.4  4.3  4.4   Chloride 98 - 111 mmol/L 103  102  104   CO2 22 - 32 mmol/L $RemoveB'20  19  19   'ScUMZeXQ$ Calcium 8.9 - 10.3 mg/dL 10.4  10.1  9.6     Lab Results  Component Value Date   WBC 11.4 (H) 07/15/2022   HGB 11.6 (L) 07/15/2022   HCT 34.6 (L) 07/15/2022   MCV 86.3 07/15/2022   PLT 341 07/15/2022   NEUTROABS 5.4 12/16/2021    ASSESSMENT & PLAN:  Malignant  neoplasm of central portion of left breast in female, estrogen receptor positive ([place]) 10/27/2017: Left simple mastectomy: Recurrent multifocal IDC grade 2 largest spanning 3.5 cm, low to high-grade DCIS, tumor involves nipple and epidermis, margins negative, left chest wall excision IDC involving the anterior margin ER 100%, PR 90%, HER-2 negative; T4 Nx stage 3b (2002: Left  breast cancer treated with lumpectomy followed by adjuvant chemo and radiation, did not take antiestrogen therapy)   CT chest abdomen pelvis 09/07/2017: No specific evidence of metastases.  Nonspecific pulmonary nodule left upper lobe 3 mm   Oncotype testing could not be performed because it was felt that the tumor was a recurrent cancer and Oncotype DX has no ability in this situation.    Recommendation: adjuvant antiestrogen therapy with letrozole 2.5 mg daily started 11/14/2017-discontinued December 2022 because of arthralgias (I offered her systemic chemotherapy but patient declined)   Chest pain: Emergency room 07/15/2022: CT angiogram: Hilar and mediastinal lymph nodes and a T6 vertebral body abnormality concerning for metastatic disease. 07/30/2022: PET/CT scan: Positive for nodal metastases in the right axilla, right retropectoral, mediastinum and both hila.  Widespread confluent tracer uptake bone metastases are identified within the axillary appendicular skeleton.  No solid organ metastases.  Treatment plan: 1.  We can request an ultrasound-guided biopsy of the right axillary lymph node. Return to clinic after the biopsy to discuss results.    No orders of the defined types were placed in this encounter.  The patient has a good understanding of the overall plan. she agrees with it. she will call with any problems that may develop before the next visit here. Total time spent: 30 mins including face to face time and time spent for planning, charting and co-ordination of care   Harriette Ohara, MD 08/02/22    I  Gardiner Coins am scribing for Dr. Lindi Adie  I have reviewed the above documentation for accuracy and completeness, and I agree with the above.

## 2022-07-29 ENCOUNTER — Telehealth: Payer: Self-pay | Admitting: Hematology and Oncology

## 2022-07-29 ENCOUNTER — Ambulatory Visit (HOSPITAL_COMMUNITY)
Admission: RE | Admit: 2022-07-29 | Discharge: 2022-07-29 | Disposition: A | Discharge status: Home / Self Care | Payer: Medicare Other | Source: Ambulatory Visit | Attending: Hematology and Oncology | Admitting: Hematology and Oncology

## 2022-07-29 VITALS — Wt 215.0 lb

## 2022-07-29 DIAGNOSIS — C50112 Malignant neoplasm of central portion of left female breast: Secondary | ICD-10-CM | POA: Diagnosis not present

## 2022-07-29 DIAGNOSIS — Z170 Estrogen receptor positive status [ER+]: Secondary | ICD-10-CM | POA: Diagnosis not present

## 2022-07-29 DIAGNOSIS — C50919 Malignant neoplasm of unspecified site of unspecified female breast: Secondary | ICD-10-CM | POA: Diagnosis not present

## 2022-07-29 DIAGNOSIS — R590 Localized enlarged lymph nodes: Secondary | ICD-10-CM | POA: Insufficient documentation

## 2022-07-29 LAB — GLUCOSE, CAPILLARY: Glucose-Capillary: 138 mg/dL — ABNORMAL HIGH (ref 70–99)

## 2022-07-29 MED ORDER — FLUDEOXYGLUCOSE F - 18 (FDG) INJECTION
10.9000 | Freq: Once | INTRAVENOUS | Status: AC
  Administered 2022-07-29: 10.6 via INTRAVENOUS

## 2022-07-29 NOTE — Telephone Encounter (Signed)
Called patient per 10/2 staff message. Patient is aware that her FU appointment with Dr.Gudena is Monday 08/02/22 at 3:15p.

## 2022-07-30 ENCOUNTER — Ambulatory Visit: Payer: Medicare Other | Admitting: Hematology and Oncology

## 2022-08-02 ENCOUNTER — Inpatient Hospital Stay: Payer: Medicare Other | Attending: Hematology and Oncology | Admitting: Hematology and Oncology

## 2022-08-02 ENCOUNTER — Other Ambulatory Visit: Payer: Self-pay

## 2022-08-02 ENCOUNTER — Other Ambulatory Visit: Payer: Self-pay | Admitting: *Deleted

## 2022-08-02 DIAGNOSIS — Z170 Estrogen receptor positive status [ER+]: Secondary | ICD-10-CM | POA: Diagnosis not present

## 2022-08-02 DIAGNOSIS — Z7901 Long term (current) use of anticoagulants: Secondary | ICD-10-CM | POA: Insufficient documentation

## 2022-08-02 DIAGNOSIS — C50112 Malignant neoplasm of central portion of left female breast: Secondary | ICD-10-CM | POA: Diagnosis not present

## 2022-08-02 DIAGNOSIS — Z7984 Long term (current) use of oral hypoglycemic drugs: Secondary | ICD-10-CM | POA: Diagnosis not present

## 2022-08-02 DIAGNOSIS — R911 Solitary pulmonary nodule: Secondary | ICD-10-CM | POA: Insufficient documentation

## 2022-08-02 DIAGNOSIS — Z79811 Long term (current) use of aromatase inhibitors: Secondary | ICD-10-CM | POA: Diagnosis not present

## 2022-08-02 DIAGNOSIS — Z79899 Other long term (current) drug therapy: Secondary | ICD-10-CM | POA: Insufficient documentation

## 2022-08-02 DIAGNOSIS — C50911 Malignant neoplasm of unspecified site of right female breast: Secondary | ICD-10-CM

## 2022-08-02 MED ORDER — MORPHINE SULFATE 15 MG PO TABS: 15.0000 mg | ORAL_TABLET | ORAL | 0 refills | Status: DC | PRN

## 2022-08-02 NOTE — Assessment & Plan Note (Signed)
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 Nonspecific pulmonary nodule left upper lobe 3 mm  Oncotype testing could not be performed because it was felt that the tumor was a recurrent cancer and Oncotype DX has no ability in this situation.   Recommendation: adjuvant antiestrogen therapy with letrozole 2.5 mg dailystarted 11/14/2017-discontinued December 2022 because of arthralgias (I offered her systemic chemotherapy but patient declined)  Chest pain: Emergency room 07/15/2022: CT angiogram: Hilar and mediastinal lymph nodes and a T6 vertebral body abnormality concerning for metastatic disease. 07/30/2022: PET/CT scan: Positive for nodal metastases in the right axilla, right retropectoral, mediastinum and both hila.  Widespread confluent tracer uptake bone metastases are identified within the axillary appendicular skeleton.  No solid organ metastases.  Treatment plan: 1.  We can request an ultrasound-guided biopsy of the right axillary lymph node. Return to clinic after the biopsy to discuss results. 

## 2022-08-03 ENCOUNTER — Other Ambulatory Visit: Payer: Self-pay | Admitting: Hematology and Oncology

## 2022-08-03 DIAGNOSIS — C50911 Malignant neoplasm of unspecified site of right female breast: Secondary | ICD-10-CM

## 2022-08-05 ENCOUNTER — Ambulatory Visit
Admission: RE | Admit: 2022-08-05 | Discharge: 2022-08-05 | Disposition: A | Discharge status: Home / Self Care | Payer: Medicare Other | Source: Ambulatory Visit | Attending: Hematology and Oncology | Admitting: Hematology and Oncology

## 2022-08-05 ENCOUNTER — Other Ambulatory Visit: Payer: Self-pay | Admitting: Hematology and Oncology

## 2022-08-05 DIAGNOSIS — C50911 Malignant neoplasm of unspecified site of right female breast: Secondary | ICD-10-CM

## 2022-08-05 DIAGNOSIS — N6489 Other specified disorders of breast: Secondary | ICD-10-CM | POA: Diagnosis not present

## 2022-08-05 DIAGNOSIS — R590 Localized enlarged lymph nodes: Secondary | ICD-10-CM | POA: Diagnosis not present

## 2022-08-05 DIAGNOSIS — Z853 Personal history of malignant neoplasm of breast: Secondary | ICD-10-CM | POA: Diagnosis not present

## 2022-08-05 DIAGNOSIS — C801 Malignant (primary) neoplasm, unspecified: Secondary | ICD-10-CM | POA: Diagnosis not present

## 2022-08-05 DIAGNOSIS — C773 Secondary and unspecified malignant neoplasm of axilla and upper limb lymph nodes: Secondary | ICD-10-CM | POA: Diagnosis not present

## 2022-08-06 NOTE — Progress Notes (Signed)
Patient Care Team: Isaac Bliss, Rayford Halsted, MD as PCP - General (Internal Medicine) Sueanne Margarita, MD as PCP - Cardiology (Cardiology) Viona Gilmore, [facility] as Pharmacist (Pharmacist)  DIAGNOSIS: No diagnosis found.  SUMMARY OF ONCOLOGIC HISTORY: Oncology History  Malignant neoplasm of central portion of left breast in female, estrogen receptor positive ([place])  2002 Initial Diagnosis   Left breast cancer treated with lumpectomy followed by adjuvant chemotherapy (does not remember chemotherapy drugs) and radiation.  Did not take antiestrogen therapy   07/11/2017 Relapse/Recurrence   Left breast skin papule; biopsy proven to be invasive mammary carcinoma ER 100% PR 90% positive HER-2 negative ratio 1.64; Right breast 1.5 cm indeterminate calcifications biopsy proven to be complex sclerosing lesion   08/15/2017 Surgery   Left breast excision: Invasive carcinoma involving the dermis, ER 100%, PR 90%, HER-2 negative ratio 1.64.  Right lumpectomy: Complex sclerosing lesion    10/27/2017 Surgery   Left simple mastectomy: Recurrent multifocal IDC grade 2 largest spanning 3.5 cm, low to high-grade DCIS, tumor involves nipple and epidermis, margins negative, left chest wall excision IDC involving the anterior margin ER 100%, PR 90%, HER-2 negative; T4 Nx stage 3b   11/14/2017 -  Anti-estrogen oral therapy   Letrozole 2.5 mg daily     CHIEF COMPLIANT:   INTERVAL HISTORY: Cynthia CASHAW is a 76 y.o with the above-mentioned. She presents to the clinic for a follow-up   ALLERGIES:  is allergic to tikosyn [dofetilide], zolpidem, codeine, codeine phosphate, and methylprednisolone.  MEDICATIONS:  Current Outpatient Medications  Medication Sig Dispense Refill   atorvastatin (LIPITOR) 40 MG tablet TAKE 1 TABLET BY MOUTH EVERYDAY AT BEDTIME 90 tablet 3   blood glucose meter kit and supplies KIT Dispense based on patient and insurance preference. Use up to four times daily as directed. 1  each 0   Cholecalciferol (VITAMIN D3) 2000 units capsule Take 2,000 Units by mouth daily.     clotrimazole-betamethasone (LOTRISONE) cream Apply 1 application topically 2 (two) times daily. (Patient taking differently: Apply 1 application  topically daily as needed (Rash on feet).) 30 g 0   colchicine 0.6 MG tablet TAKE 1 TABLET BY MOUTH 2 TIMES DAILY FOR 21 DAYS. (Patient taking differently: Take 0.6 mg by mouth daily as needed (Gout).) 42 tablet 1   cyclobenzaprine (FLEXERIL) 5 MG tablet Take 1 tablet (5 mg total) by mouth at bedtime as needed for muscle spasms. (Patient taking differently: Take 5 mg by mouth at bedtime.) 30 tablet 0   dapagliflozin propanediol (FARXIGA) 10 MG TABS tablet Take 1 tablet (10 mg total) by mouth daily before breakfast. 90 tablet 1   fexofenadine (ALLEGRA) 180 MG tablet Take 180 mg by mouth daily as needed for allergies or rhinitis.     fluconazole (DIFLUCAN) 150 MG tablet TAKE 1 TABLET BY MOUTH ONE TIME PER WEEK 6 tablet 2   fluticasone (FLONASE) 50 MCG/ACT nasal spray Place 1 spray into both nostrils daily as needed for allergies.      furosemide (LASIX) 20 MG tablet As needed patient may take 20 MG Lasix PRN by mouth QD x 4 days as directed per [employer] HF Study PRN plan. PLEASE DO NOT REMOVE. PT IS IN RESEARCH STUDY. 90 tablet 3   furosemide (LASIX) 40 MG tablet Take one tablet daily 90 tablet 3   gabapentin (NEURONTIN) 300 MG capsule TAKE 2 CAPSULES BY MOUTH AT BEDTIME 180 capsule 1   glucose blood test strip Use to check CBG AC  and QHS. 200 each 12   KLOR-CON M20 20 MEQ tablet TAKE 1 TABLET BY MOUTH EVERY DAY 90 tablet 1   losartan (COZAAR) 25 MG tablet TAKE 1 TABLET BY MOUTH TWICE A DAY 180 tablet 2   metFORMIN (GLUCOPHAGE) 500 MG tablet Take 500 mg by mouth daily.     morphine (MSIR) 15 MG tablet Take 1 tablet (15 mg total) by mouth every 4 (four) hours as needed. 30 tablet 0   nebivolol (BYSTOLIC) 2.5 MG tablet TAKE 1 TABLET BY MOUTH EVERY DAY 90  tablet 3   OneTouch Delica Lancets 89F MISC 1 each by Does not apply route daily. 100 each 1   pantoprazole (PROTONIX) 40 MG tablet TAKE 1 TABLET BY MOUTH EVERY DAY 90 tablet 3   potassium chloride (KLOR-CON) 10 MEQ tablet As needed patient may take 10 mEq PRN by mouth QD x 4 days as directed per [employer] HF Study PRN plan. PLEASE DO NOT REMOVE 90 tablet 3   spironolactone (ALDACTONE) 25 MG tablet Take 25 mg by mouth daily. Take 1/2 tablet on the days that you are taking 2 of the furosemide tablets.     XARELTO 20 MG TABS tablet TAKE 1 TABLET BY MOUTH EVERY DAY WITH SUPPER 90 tablet 1   No current facility-administered medications for this visit.    PHYSICAL EXAMINATION: ECOG PERFORMANCE STATUS: {CHL ONC ECOG [phone]}  There were no vitals filed for this visit. There were no vitals filed for this visit.  BREAST:*** No palpable masses or nodules in either right or left breasts. No palpable axillary supraclavicular or infraclavicular adenopathy no breast tenderness or nipple discharge. (exam performed in the presence of a chaperone)  LABORATORY DATA:  I have reviewed the data as listed    Latest Ref Rng & Units 07/15/2022    4:33 PM 07/01/2022    4:37 PM 03/17/2022   10:45 AM  CMP  Glucose 70 - 99 mg/dL 147  112  179   BUN 8 - 23 mg/dL 30  32  28   Creatinine 0.44 - 1.00 mg/dL 1.17  1.32  1.06   Sodium 135 - 145 mmol/L 138  140  140   Potassium 3.5 - 5.1 mmol/L 4.4  4.3  4.4   Chloride 98 - 111 mmol/L 103  102  104   CO2 22 - 32 mmol/L $RemoveB'20  19  19   'JpcQpVku$ Calcium 8.9 - 10.3 mg/dL 10.4  10.1  9.6     Lab Results  Component Value Date   WBC 11.4 (H) 07/15/2022   HGB 11.6 (L) 07/15/2022   HCT 34.6 (L) 07/15/2022   MCV 86.3 07/15/2022   PLT 341 07/15/2022   NEUTROABS 5.4 12/16/2021    ASSESSMENT & PLAN:  No problem-specific Assessment & Plan notes found for this encounter.    No orders of the defined types were placed in this encounter.  The patient has a good  understanding of the overall plan. she agrees with it. she will call with any problems that may develop before the next visit here. Total time spent: 30 mins including face to face time and time spent for planning, charting and co-ordination of care   Suzzette Righter, [place] 08/06/22    I Gardiner Coins am scribing for Dr. Lindi Adie  ***

## 2022-08-09 ENCOUNTER — Other Ambulatory Visit: Payer: Self-pay

## 2022-08-09 ENCOUNTER — Inpatient Hospital Stay (HOSPITAL_BASED_OUTPATIENT_CLINIC_OR_DEPARTMENT_OTHER): Payer: Medicare Other | Admitting: Hematology and Oncology

## 2022-08-09 ENCOUNTER — Encounter (HOSPITAL_COMMUNITY): Payer: Self-pay | Admitting: Physician Assistant

## 2022-08-09 ENCOUNTER — Telehealth: Payer: Self-pay | Admitting: *Deleted

## 2022-08-09 DIAGNOSIS — Z7984 Long term (current) use of oral hypoglycemic drugs: Secondary | ICD-10-CM | POA: Diagnosis not present

## 2022-08-09 DIAGNOSIS — Z170 Estrogen receptor positive status [ER+]: Secondary | ICD-10-CM | POA: Diagnosis not present

## 2022-08-09 DIAGNOSIS — C50911 Malignant neoplasm of unspecified site of right female breast: Secondary | ICD-10-CM

## 2022-08-09 DIAGNOSIS — R911 Solitary pulmonary nodule: Secondary | ICD-10-CM | POA: Diagnosis not present

## 2022-08-09 DIAGNOSIS — Z79899 Other long term (current) drug therapy: Secondary | ICD-10-CM | POA: Diagnosis not present

## 2022-08-09 DIAGNOSIS — C50112 Malignant neoplasm of central portion of left female breast: Secondary | ICD-10-CM

## 2022-08-09 DIAGNOSIS — Z79811 Long term (current) use of aromatase inhibitors: Secondary | ICD-10-CM | POA: Diagnosis not present

## 2022-08-09 DIAGNOSIS — Z7901 Long term (current) use of anticoagulants: Secondary | ICD-10-CM | POA: Diagnosis not present

## 2022-08-09 NOTE — Assessment & Plan Note (Addendum)
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 Nonspecific pulmonary nodule left upper lobe 3 mm   Chest pain: Emergency room 07/15/2022: CT angiogram: Hilar and mediastinal lymph nodes and a T6 vertebral body abnormality concerning for metastatic disease.  07/30/2022: PET/CT scan: Positive for nodal metastases in the right axilla, right retropectoral, mediastinum and both hila.  Widespread confluent tracer uptake bone metastases are identified within the axillary appendicular skeleton.  No solid organ metastases.  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  Prognostic panel pending  Goals of care question: Patient is declining and her performance status and today her blood pressure readings were very low.  I discussed with her about goals of care.  She is more interested in quality of life and prolongation of life.  She is already in a wheelchair and has issues with back pain.  She does not have great support.  Her son assists her but he is not able to stay with her all the time.  Given the risks and benefits of any systemic treatments, we recommended that she pursue hospice care.  I simplified her medication regimen today.

## 2022-08-09 NOTE — Anesthesia Preprocedure Evaluation (Deleted)
Anesthesia Evaluation    Airway        Dental   Pulmonary former smoker,           Cardiovascular hypertension,      Neuro/Psych    GI/Hepatic   Endo/Other  diabetes  Renal/GU      Musculoskeletal   Abdominal   Peds  Hematology   Anesthesia Other Findings   Reproductive/Obstetrics                             Anesthesia Physical Anesthesia Plan  ASA:   Anesthesia Plan:    Post-op Pain Management:    Induction:   PONV Risk Score and Plan:   Airway Management Planned:   Additional Equipment:   Intra-op Plan:   Post-operative Plan:   Informed Consent:   Plan Discussed with:   Anesthesia Plan Comments: (PAT note by Cynthia Caldwell, PA-C: Follows with cardiology for history of non-obstructive CAD, NICM (? If tachy-mediated 2/2 AF), HTN, HLD, chronic CHF (systolic), AFib s/p ablation 2020 and subsequent ILR placement 03/17/2022, OSA w/dental appliance.  Last seen by Tommye Standard, PA-C on 07/01/2022.  Noted to be stable at that time, no changes made to management.  Follows with oncology for history of breast cancer s/p left mastectomy with recurrence x2.  She was seen in the ED 07/15/2022 with complaint of chest pain. 07/15/2022: CT angiogram showed hilar and mediastinal lymph nodes and a T6 vertebral body abnormality concerning for metastatic disease. 07/30/2022 PET/CT scan: Positive for nodal metastases in the right axilla, right retropectoral, mediastinum and both hila. Widespread confluent tracer uptake bone metastases are identified within the axillary appendicular skeleton. No solid organ metastases.  Per Dr. Juline Patch note 07/19/2022, patient last dose Xarelto to be 08/07/2022.  Former smoker with associated COPD.  Non-insulin-dependent DM2, last A1c 6.6 on 05/25/2022.  Patient will need day of surgery labs evaluation.  EKG 07/19/2022: Sinus rhythm with occasional PVCs.  Nonspecific ST and T  wave abnormality.  Rate 76.  TTE 01/21/2022: 1. Left ventricular ejection fraction, by estimation, is 45 to 50%. The  left ventricle has mildly decreased function. The left ventricle  demonstrates global hypokinesis. Left ventricular diastolic parameters  were normal.  2. Right ventricular systolic function is normal. The right ventricular  size is normal. There is normal pulmonary artery systolic pressure.  3. Left atrial size was mildly dilated.  4. The mitral valve is normal in structure. Trivial mitral valve  regurgitation. No evidence of mitral stenosis.  5. The aortic valve is tricuspid. Aortic valve regurgitation is not  visualized. No aortic stenosis is present.  6. The inferior vena cava is normal in size with greater than 50%  respiratory variability, suggesting right atrial pressure of 3 mmHg.   Comparison(s): No significant change from prior study.   stress myoview 04/10/2018: . Nuclear stress EF: 41%. . No T wave inversion was noted during stress. . There was no ST segment deviation noted during stress. . This is an intermediate risk study.  Patchy poor tracer uptake. A few small fixed defects not corresponding to vascular distribution. No reversible ischemia. LVEF 41% with global hypokinesis. This is an intermediate risk study due to reduced LV systolic function  )        Anesthesia Quick Evaluation

## 2022-08-09 NOTE — Progress Notes (Signed)
Anesthesia Chart Review: Same day workup  Follows with cardiology for history of non-obstructive CAD, NICM (? If tachy-mediated 2/2 AF), HTN, HLD, chronic CHF (systolic), AFib s/p ablation 2020 and subsequent ILR placement 03/17/2022, OSA w/dental appliance.  Last seen by Tommye Standard, PA-C on 07/01/2022.  Noted to be stable at that time, no changes made to management.  Follows with oncology for history of breast cancer s/p left mastectomy with recurrence x2.  She was seen in the ED 07/15/2022 with complaint of chest pain. 07/15/2022: CT angiogram showed hilar and mediastinal lymph nodes and a T6 vertebral body abnormality concerning for metastatic disease. 07/30/2022 PET/CT scan: Positive for nodal metastases in the right axilla, right retropectoral, mediastinum and both hila.  Widespread confluent tracer uptake bone metastases are identified within the axillary appendicular skeleton.  No solid organ metastases.  Per Dr. Juline Patch note 07/19/2022, patient last dose Xarelto to be 08/07/2022.  Former smoker with associated COPD.  Non-insulin-dependent DM2, last A1c 6.6 on 05/25/2022.  Patient will need day of surgery labs evaluation.  EKG 07/19/2022: Sinus rhythm with occasional PVCs.  Nonspecific ST and T wave abnormality.  Rate 76.  TTE 01/21/2022:  1. Left ventricular ejection fraction, by estimation, is 45 to 50%. The  left ventricle has mildly decreased function. The left ventricle  demonstrates global hypokinesis. Left ventricular diastolic parameters  were normal.   2. Right ventricular systolic function is normal. The right ventricular  size is normal. There is normal pulmonary artery systolic pressure.   3. Left atrial size was mildly dilated.   4. The mitral valve is normal in structure. Trivial mitral valve  regurgitation. No evidence of mitral stenosis.   5. The aortic valve is tricuspid. Aortic valve regurgitation is not  visualized. No aortic stenosis is present.   6. The inferior vena  cava is normal in size with greater than 50%  respiratory variability, suggesting right atrial pressure of 3 mmHg.   Comparison(s): No significant change from prior study.   stress myoview 04/10/2018: Nuclear stress EF: 41%. No T wave inversion was noted during stress. There was no ST segment deviation noted during stress. This is an intermediate risk study.   Patchy poor tracer uptake. A few small fixed defects not corresponding to vascular distribution. No reversible ischemia. LVEF 41% with global hypokinesis. This is an intermediate risk study due to reduced LV systolic function    Wynonia Musty [facility] Stay [facility]/Anesthesiology Phone [phone] 08/09/2022 11:46 AM

## 2022-08-09 NOTE — Telephone Encounter (Signed)
Per Dr.Gudena, called in consult for hospice at home. Spoke with Sherri at [facility] for referral. She will call back if there is any additional information needed

## 2022-08-10 ENCOUNTER — Ambulatory Visit (HOSPITAL_COMMUNITY): Admission: RE | Admit: 2022-08-10 | Payer: Medicare Other | Source: Home / Self Care | Admitting: Pulmonary Disease

## 2022-08-10 DIAGNOSIS — R590 Localized enlarged lymph nodes: Secondary | ICD-10-CM | POA: Insufficient documentation

## 2022-08-10 DIAGNOSIS — Z853 Personal history of malignant neoplasm of breast: Secondary | ICD-10-CM

## 2022-08-10 SURGERY — BRONCHOSCOPY, WITH EBUS
Anesthesia: General | Laterality: Bilateral

## 2022-08-17 ENCOUNTER — Telehealth: Payer: Medicare Other

## 2022-08-19 ENCOUNTER — Ambulatory Visit: Payer: Medicare Other | Admitting: Pulmonary Disease

## 2022-08-19 NOTE — Progress Notes (Incomplete)
Synopsis: Referred in September 2023 for pulmonary nodule by Isaac Bliss, [name]  Subjective:   PATIENT ID: Cynthia Herrera GENDER: female DOB: 1946/10/14, MRN: [id number]  No chief complaint on file.   This is a 76 year old female, past medical history of left breast cancer treated with chemoradiation in 2002And a left mastectomy in 2018.  Patient has chronic systolic heart failure, COPD, history of a dilated cardiomyopathy and a repeat echo with improvement of EF.  OSA, atrial fibrillation.Patient of Dr. Chase Caller.  Also of Dr. Lindi Adie from medical oncology.  Was referred here for evaluation after CT scan of the chest on 07/15/2022.  There was enlarged mediastinal lymph nodes in both the mediastinum and hilum concerning for metastatic disease.  Eggs well as a lesion potentially within the T6 vertebra.  Patient was referred for evaluation of bronchoscopy and tissue sampling.  Oncology History  Malignant neoplasm of central portion of left breast in female, estrogen receptor positive ([place])  2002 Initial Diagnosis   Left breast cancer treated with lumpectomy followed by adjuvant chemotherapy (does not remember chemotherapy drugs) and radiation.  Did not take antiestrogen therapy   07/11/2017 Relapse/Recurrence   Left breast skin papule; biopsy proven to be invasive mammary carcinoma ER 100% PR 90% positive HER-2 negative ratio 1.64; Right breast 1.5 cm indeterminate calcifications biopsy proven to be complex sclerosing lesion   08/15/2017 Surgery   Left breast excision: Invasive carcinoma involving the dermis, ER 100%, PR 90%, HER-2 negative ratio 1.64.  Right lumpectomy: Complex sclerosing lesion    10/27/2017 Surgery   Left simple mastectomy: Recurrent multifocal IDC grade 2 largest spanning 3.5 cm, low to high-grade DCIS, tumor involves nipple and epidermis, margins negative, left chest wall excision IDC involving the anterior margin ER 100%, PR 90%, HER-2 negative; T4 Nx stage 3b    11/14/2017 -  Anti-estrogen oral therapy   Letrozole 2.5 mg daily      Past Medical History:  Diagnosis Date  . Arthritis   . Breast cancer, left breast ([place]) 2002   "then treated w/chemo and radiation"  . Breast cancer, left breast ([place]) 07/14/2017   "tx'd w/mastectomy"  . Chronic back pain    h/o lumbar stenosis; "no problem since my OR" (`10/27/2017)  . Chronic systolic CHF (congestive heart failure) ([place])   . Complication of anesthesia    last surgery iv med when going to sleep"burned as injected"  . COPD (chronic obstructive pulmonary disease) ([facility])    no inhalers   . DCM (dilated cardiomyopathy) ([place])    EF 15-20% ? tachycardia induced - EF 35-40% by echo 05/2018  . Dyspnea    with exertion  . Gallstones   . GERD (gastroesophageal reflux disease)    once in a while;depends on what she eats  . History of bronchitis    "chronic when I smoked; no problem since I quit in 2002" (10/27/2017)  . Hyperlipidemia   . Hypertension    takes Losartan and Metoprolol.   . Joint pain   . Muscle spasm    takes Flexeril daily as needed   . Nocturia   . OSA (obstructive sleep apnea) 08/22/2018   Severe obstructive sleep apnea with an AHI 47.3/h and no significant central sleep apnea with moderate oxygen desaturations as low as 79%  . Osteopenia   . Persistent atrial fibrillation ([place])    s/p TEE DCCV and repeat DCCV 11/14/2013 and 02/2018  . Personal history of colonic polyps    adenomas 03 and 08  .  Pneumonia    hx  . Seasonal allergies   . Thyroid nodule   . Type II diabetes mellitus ([place])   . Vitamin D deficiency    takes Vit D     Family History  Problem Relation Age of Onset  . Dementia Sister   . Diabetes Sister   . Alzheimer's disease Sister   . Diabetes Brother        x 2  . Heart disease Brother   . Irritable bowel syndrome Brother   . Liver cancer Mother   . Diabetes Mother   . Pancreatic cancer Mother   . Diabetes Father   . Heart disease Father      Past  Surgical History:  Procedure Laterality Date  . ATRIAL FIBRILLATION ABLATION N/A 12/12/2018   Procedure: ATRIAL FIBRILLATION ABLATION;  Surgeon: Thompson Grayer, MD;  Location: [facility];  Service: Cardiovascular;  Laterality: N/A;  . AXILLARY LYMPH NODE DISSECTION Left 10/2001   persistent intramammary node Archie Endo 03/09/2011  . BACK SURGERY    . BREAST BIOPSY Left 2002  . BREAST BIOPSY Right 06/2017   "node bx was negative"  . BREAST BIOPSY Left 06/2017   "positive for cancer"  . BREAST LUMPECTOMY WITH RADIOACTIVE SEED LOCALIZATION Right 08/15/2017   Procedure: RIGHT BREAST LUMPECTOMY WITH RADIOACTIVE SEED LOCALIZATION;  Surgeon: Jovita Kussmaul, MD;  Location: [facility];  Service: General;  Laterality: Right;  . CARDIAC CATHETERIZATION  2015  . CARDIOVERSION N/A 11/12/2013   Procedure: CARDIOVERSION;  Surgeon: Thayer Headings, MD;  Location: [facility] ENDOSCOPY;  Service: Cardiovascular;  Laterality: N/A;  10:08  Dr. Marissa Nestle, anesthesia present, [place]   '60mg'$ ,  propofol $RemoveB'50mg'[id number]$ , IV for elective cardioversion....Dr. Cathie Olden delievered synch 120 joules with successful cardioversion to NSR  . CARDIOVERSION N/A 11/12/2013   Procedure: CARDIOVERSION;  Surgeon: Thayer Headings, MD;  Location: [facility];  Service: Cardiovascular;  Laterality: N/A;  . CARDIOVERSION N/A 11/14/2013   Procedure: CARDIOVERSION (BEDSIDE);  Surgeon: Larey Dresser, MD;  Location: [facility];  Service: Cardiovascular;  Laterality: N/A;  . CARDIOVERSION N/A 02/22/2018   Procedure: CARDIOVERSION;  Surgeon: Sueanne Margarita, MD;  Location: [facility] ENDOSCOPY;  Service: Cardiovascular;  Laterality: N/A;  . CATARACT EXTRACTION W/ INTRAOCULAR LENS  IMPLANT, BILATERAL Bilateral ~ 2016  . COLONOSCOPY  2003, September 2008, April 01, 2011   adenomas 03 and 08, polyp 12, diverticulosis  . COLONOSCOPY WITH PROPOFOL N/A 11/16/2016   Procedure: COLONOSCOPY WITH PROPOFOL;  Surgeon: Gatha Mayer, MD;  Location: [facility] ENDOSCOPY;  Service: Endoscopy;   Laterality: N/A;  . implantable loop recorder placement  03/17/2022   Medtronic Reveal Linq model LNQ 11 (SN [id number]) implantable loop recorder implanted for Alleviate HF trial  . JOINT REPLACEMENT    . LAPAROSCOPIC CHOLECYSTECTOMY  2004  . LEFT AND RIGHT HEART CATHETERIZATION WITH CORONARY ANGIOGRAM N/A 02/01/2014   Procedure: LEFT AND RIGHT HEART CATHETERIZATION WITH CORONARY ANGIOGRAM;  Surgeon: Sueanne Margarita, MD;  Location: [facility];  Service: Cardiovascular;  Laterality: N/A;  . LUMBAR LAMINECTOMY/DECOMPRESSION MICRODISCECTOMY N/A 03/19/2014   Procedure: LUMBAR LAMINECTOMY/DECOMPRESSION MICRODISCECTOMY 4 LEVEL;  Surgeon: Kristeen Miss, MD;  Location: [facility]URO [facility]S;  Service: Neurosurgery;  Laterality: N/A;  L1-2 L2-3 L3-4 L4-5 Laminectomy/Foraminotomy  . MASS EXCISION Left 08/15/2017   Procedure: EXCISION OF LEFT BREAST MASS;  Surgeon: Jovita Kussmaul, MD;  Location: [facility];  Service: General;  Laterality: Left;  Marland Kitchen MASTECTOMY Left 10/27/2017  . MASTECTOMY PARTIAL / LUMPECTOMY W/ AXILLARY LYMPHADENECTOMY  05/08/2001   Archie Endo 03/09/2011  . PORT-A-CATH REMOVAL  2003  . PORTA CATH INSERTION  2002  . TEE WITHOUT CARDIOVERSION N/A 11/12/2013   Procedure: TRANSESOPHAGEAL ECHOCARDIOGRAM (TEE);  Surgeon: Thayer Headings, MD;  Location: [facility];  Service: Cardiovascular;  Laterality: N/A;  pt b/p low, pt buccal membranes very dry, lips scapped, pt c/o thirst. NPO since MN and iv FLUIDS TOTAL INFUSING AT TOTAL 20ML HR....  Dr. Cathie Olden order allow NS to bolus during procedure....very dry,NS bolus 250 ml total..pt responding well to meds..  . TOTAL KNEE ARTHROPLASTY Left 2006  . TOTAL KNEE ARTHROPLASTY Right 05/10/2016   Procedure: RIGHT TOTAL KNEE ARTHROPLASTY;  Surgeon: Gaynelle Arabian, MD;  Location: [facility];  Service: Orthopedics;  Laterality: Right;  With adductor block  . TOTAL MASTECTOMY Left 10/27/2017   Procedure: LEFT MASTECTOMY;  Surgeon: Jovita Kussmaul, MD;  Location: [facility];  Service:  General;  Laterality: Left;  . TUBAL LIGATION      Social History   Socioeconomic History  . Marital status: Widowed    Spouse name: Not on file  . Number of children: 2  . Years of education: Not on file  . Highest education level: Not on file  Occupational History  . Occupation: retired  Tobacco Use  . Smoking status: Former    Packs/day: 1.50    Years: 35.00    Total pack years: 52.50    Types: Cigarettes    Quit date: 08/24/2001    Years since quitting: 21.0  . Smokeless tobacco: Never  Vaping Use  . Vaping Use: Never used  Substance and Sexual Activity  . Alcohol use: No  . Drug use: No  . Sexual activity: Never    Birth control/protection: Post-menopausal  Other Topics Concern  . Not on file  Social History Narrative  . Not on file   Social Determinants of Health   Financial Resource Strain: Low Risk  (04/24/2020)   Overall Financial Resource Strain (CARDIA)   . Difficulty of Paying Living Expenses: Not very hard  Food Insecurity: Not on file  Transportation Needs: Not on file  Physical Activity: Inactive (04/24/2020)   Exercise Vital Sign   . Days of Exercise per Week: 0 days   . Minutes of Exercise per Session: 0 min  Stress: Not on file  Social Connections: Not on file  Intimate Partner Violence: Not on file     Allergies  Allergen Reactions  . Tikosyn [Dofetilide]     Prolonged QT/QTc  . Zolpidem Other (See Comments)    Hallucinations, up walking around ? Hallucinations, up walking around  . Codeine Other (See Comments)  . Codeine Phosphate Nausea And Vomiting  . Methylprednisolone Other (See Comments)    Felt really weird w the high dose oral steroid, but tolerates low doses or oral steroid Felt really weird w the high dose oral steroid, but tolerates low doses or oral steroid     Outpatient Medications Prior to Visit  Medication Sig Dispense Refill  . blood glucose meter kit and supplies KIT Dispense based on patient and insurance preference.  Use up to four times daily as directed. 1 each 0  . colchicine 0.6 MG tablet TAKE 1 TABLET BY MOUTH 2 TIMES DAILY FOR 21 DAYS. (Patient taking differently: Take 0.6 mg by mouth daily as needed (Gout).) 42 tablet 1  . dapagliflozin propanediol (FARXIGA) 10 MG TABS tablet Take 1 tablet (10 mg total) by mouth daily before breakfast. 90 tablet 1  .  fluticasone (FLONASE) 50 MCG/ACT nasal spray Place 1 spray into both nostrils daily as needed for allergies.     . furosemide (LASIX) 20 MG tablet As needed patient may take 20 MG Lasix PRN by mouth QD x 4 days as directed per Alleviate Research HF Study PRN plan. PLEASE DO NOT REMOVE. PT IS IN RESEARCH STUDY. 90 tablet 3  . furosemide (LASIX) 40 MG tablet Take one tablet daily 90 tablet 3  . glucose blood test strip Use to check CBG AC and QHS. 200 each 12  . KLOR-CON M20 20 MEQ tablet TAKE 1 TABLET BY MOUTH EVERY DAY 90 tablet 1  . metFORMIN (GLUCOPHAGE) 500 MG tablet Take 500 mg by mouth daily.    Glory Rosebush Delica Lancets 42P MISC 1 each by Does not apply route daily. 100 each 1  . potassium chloride (KLOR-CON) 10 MEQ tablet As needed patient may take 10 mEq PRN by mouth QD x 4 days as directed per Alleviate Research HF Study PRN plan. PLEASE DO NOT REMOVE 90 tablet 3  . spironolactone (ALDACTONE) 25 MG tablet Take 25 mg by mouth daily. Take 1/2 tablet on the days that you are taking 2 of the furosemide tablets.    Alveda Reasons 20 MG TABS tablet TAKE 1 TABLET BY MOUTH EVERY DAY WITH SUPPER 90 tablet 1   No facility-administered medications prior to visit.    Review of Systems  Constitutional:  Negative for chills, fever, malaise/fatigue and weight loss.  HENT:  Negative for hearing loss, sore throat and tinnitus.   Eyes:  Negative for blurred vision and double vision.  Respiratory:  Negative for cough, hemoptysis, sputum production, shortness of breath, wheezing and stridor.   Cardiovascular:  Negative for chest pain, palpitations, orthopnea, leg  swelling and PND.  Gastrointestinal:  Negative for abdominal pain, constipation, diarrhea, heartburn, nausea and vomiting.  Genitourinary:  Negative for dysuria, hematuria and urgency.  Musculoskeletal:  Negative for joint pain and myalgias.  Skin:  Negative for itching and rash.  Neurological:  Negative for dizziness, tingling, weakness and headaches.  Endo/Heme/Allergies:  Negative for environmental allergies. Does not bruise/bleed easily.  Psychiatric/Behavioral:  Negative for depression. The patient is not nervous/anxious and does not have insomnia.   All other systems reviewed and are negative.    Objective:  Physical Exam Vitals reviewed.  Constitutional:      General: She is not in acute distress.    Appearance: She is well-developed.  HENT:     Head: Normocephalic and atraumatic.  Eyes:     General: No scleral icterus.    Conjunctiva/sclera: Conjunctivae normal.     Pupils: Pupils are equal, round, and reactive to light.  Neck:     Vascular: No JVD.     Trachea: No tracheal deviation.  Cardiovascular:     Rate and Rhythm: Normal rate and regular rhythm.     Heart sounds: Normal heart sounds. No murmur heard. Pulmonary:     Effort: Pulmonary effort is normal. No tachypnea, accessory muscle usage or respiratory distress.     Breath sounds: No stridor. No wheezing, rhonchi or rales.  Abdominal:     General: There is no distension.     Palpations: Abdomen is soft.     Tenderness: There is no abdominal tenderness.  Musculoskeletal:        General: No tenderness.     Cervical back: Neck supple.  Lymphadenopathy:     Cervical: No cervical adenopathy.  Skin:    General:  Skin is warm and dry.     Capillary Refill: Capillary refill takes less than 2 seconds.     Findings: No rash.  Neurological:     Mental Status: She is alert and oriented to person, place, and time.  Psychiatric:        Behavior: Behavior normal.     There were no vitals filed for this visit.     on RA BMI Readings from Last 3 Encounters:  08/09/22 34.28 kg/m  08/02/22 34.80 kg/m  07/29/22 34.70 kg/m   Wt Readings from Last 3 Encounters:  08/09/22 212 lb 6.4 oz (96.3 kg)  08/02/22 215 lb 9.6 oz (97.8 kg)  07/29/22 215 lb (97.5 kg)     CBC    Component Value Date/Time   WBC 11.4 (H) 07/15/2022 1633   RBC 4.01 07/15/2022 1633   HGB 11.6 (L) 07/15/2022 1633   HGB 11.8 07/01/2022 1637   HGB 12.8 12/10/2009 1401   HCT 34.6 (L) 07/15/2022 1633   HCT 34.3 07/01/2022 1637   HCT 37.3 12/10/2009 1401   PLT 341 07/15/2022 1633   PLT 367 07/01/2022 1637   MCV 86.3 07/15/2022 1633   MCV 85 07/01/2022 1637   MCV 89.0 12/10/2009 1401   MCH 28.9 07/15/2022 1633   MCHC 33.5 07/15/2022 1633   RDW 14.7 07/15/2022 1633   RDW 13.2 07/01/2022 1637   RDW 12.6 12/10/2009 1401   LYMPHSABS 1.1 12/16/2021 1432   LYMPHSABS 1.4 12/10/2009 1401   MONOABS 0.8 02/03/2017 1207   MONOABS 0.5 12/10/2009 1401   EOSABS 1.3 (H) 12/16/2021 1432   BASOSABS 0.1 12/16/2021 1432   BASOSABS 0.0 12/10/2009 1401     Chest Imaging: 07/15/2022 CT chest: Adenopathy concerning for metastatic disease, T6 lesion. The patient's images have been independently reviewed by me.    Pulmonary Functions Testing Results:    Latest Ref Rng & Units 05/11/2022    1:20 PM 02/23/2021    3:58 PM 04/18/2019    3:48 PM 08/24/2018    8:49 AM 12/05/2014    1:03 PM  PFT Results  FVC-Pre L 2.53  2.45  2.81  2.77  2.76   FVC-Predicted Pre % 83  79  89  87  81   FVC-Post L  2.60   2.71  2.72   FVC-Predicted Post %  84   85  80   Pre FEV1/FVC % % 81  78  84  83  85   Post FEV1/FCV % %  82   86  84   FEV1-Pre L 2.05  1.91  2.35  2.28  2.34   FEV1-Predicted Pre % 90  82  99  95  90   FEV1-Post L  2.13   2.32  2.30   DLCO uncorrected ml/min/mmHg 16.62  16.04  17.52  15.65  18.54   DLCO UNC% % 81  78  85  58  65   DLCO corrected ml/min/mmHg 16.62  16.04      DLCO COR %Predicted % 81  78      DLVA Predicted % 95  97  110   73  82   TLC L  4.59   4.56    TLC % Predicted %  85   85    RV % Predicted %  88   84      FeNO:   Pathology:   Echocardiogram:   Heart Catheterization:     Assessment & Plan:   No diagnosis  found.   Discussion:  This is a 76 year old female, history of breast cancer.  She has new adenopathy within the chest.  She has had recurrence of breast cancer x2.  Plan: We will set her up with video bronchoscopy with endobronchial ultrasound transbronchial needle aspiration. Patient is agreeable to proceed. We will have her hold her Xarelto 2 days prior.  Last dose on 14 October. Tentative bronchoscopy date on 08/10/2022. Patient wants to get her PET scan complete prior to that. I will reach out to oncology office to help facilitate getting her PET scan scheduled prior to the procedure.    Current Outpatient Medications:  .  blood glucose meter kit and supplies KIT, Dispense based on patient and insurance preference. Use up to four times daily as directed., Disp: 1 each, Rfl: 0 .  colchicine 0.6 MG tablet, TAKE 1 TABLET BY MOUTH 2 TIMES DAILY FOR 21 DAYS. (Patient taking differently: Take 0.6 mg by mouth daily as needed (Gout).), Disp: 42 tablet, Rfl: 1 .  dapagliflozin propanediol (FARXIGA) 10 MG TABS tablet, Take 1 tablet (10 mg total) by mouth daily before breakfast., Disp: 90 tablet, Rfl: 1 .  fluticasone (FLONASE) 50 MCG/ACT nasal spray, Place 1 spray into both nostrils daily as needed for allergies. , Disp: , Rfl:  .  furosemide (LASIX) 20 MG tablet, As needed patient may take 20 MG Lasix PRN by mouth QD x 4 days as directed per Alleviate Research HF Study PRN plan. PLEASE DO NOT REMOVE. PT IS IN RESEARCH STUDY., Disp: 90 tablet, Rfl: 3 .  furosemide (LASIX) 40 MG tablet, Take one tablet daily, Disp: 90 tablet, Rfl: 3 .  glucose blood test strip, Use to check CBG AC and QHS., Disp: 200 each, Rfl: 12 .  KLOR-CON M20 20 MEQ tablet, TAKE 1 TABLET BY MOUTH EVERY DAY, Disp: 90  tablet, Rfl: 1 .  metFORMIN (GLUCOPHAGE) 500 MG tablet, Take 500 mg by mouth daily., Disp: , Rfl:  .  OneTouch Delica Lancets 02O MISC, 1 each by Does not apply route daily., Disp: 100 each, Rfl: 1 .  potassium chloride (KLOR-CON) 10 MEQ tablet, As needed patient may take 10 mEq PRN by mouth QD x 4 days as directed per Alleviate Research HF Study PRN plan. PLEASE DO NOT REMOVE, Disp: 90 tablet, Rfl: 3 .  spironolactone (ALDACTONE) 25 MG tablet, Take 25 mg by mouth daily. Take 1/2 tablet on the days that you are taking 2 of the furosemide tablets., Disp: , Rfl:  .  XARELTO 20 MG TABS tablet, TAKE 1 TABLET BY MOUTH EVERY DAY WITH SUPPER, Disp: 90 tablet, Rfl: 1  I spent 62 minutes dedicated to the care of this patient on the date of this encounter to include pre-visit review of records, face-to-face time with the patient discussing conditions above, post visit ordering of testing, clinical documentation with the electronic health record, making appropriate referrals as documented, and communicating necessary findings to members of the patients care team.   Garner Nash, DO [facility] 08/19/2022 11:11 AM

## 2022-08-24 DIAGNOSIS — Z006 Encounter for examination for normal comparison and control in clinical research program: Secondary | ICD-10-CM

## 2022-08-24 NOTE — Research (Signed)
Alleviate Research Study  Subject has exited the Alleviate study.

## 2022-08-30 ENCOUNTER — Telehealth: Payer: Medicare Other

## 2022-08-31 ENCOUNTER — Ambulatory Visit: Payer: Medicare Other | Admitting: Cardiology

## 2022-09-07 ENCOUNTER — Telehealth: Payer: Self-pay

## 2022-09-07 NOTE — Telephone Encounter (Signed)
Caller states now w/hospice since 08/16/22 ; wants something for chest congestion and rattling; lungs filled last night; limited meds now; no chest pain; feels dizzy and wobbly, says meds might cause; Bone cancer and its terminal. On lasix now.  09/07/2022 10:01:39 AM Go to ED Now Alvis Lemmings, RN, Marcie Bal  Comments User: Manning Charity, RN Date/Time Eilene Ghazi Time): 09/07/2022 10:00:03 AM Couldn't reach her hospice nurse  User: Manning Charity, RN Date/Time Eilene Ghazi Time): 09/07/2022 10:08:25 AM !!!!!!!!!!!!!!!!!!!!!!URGENT MESSAGE TO OFFICE. UNABLE TO REACH BACKLINE x3. Called office # as well and placed on hold. Pt advised message sent to office for request. On hospice and unable to reach nurse. Pt is calling, sounds good. Has some fluid in lungs, feels SOB and is a heart pt on Lasix. ER outcome but is on hospice!!!!!!!!!!!!!!!!  Referrals REFERRED TO PCP OFFICE

## 2022-09-07 NOTE — Telephone Encounter (Signed)
Left detailed message on machine for patient to go to the ED per Dr Jerilee Hoh

## 2022-09-08 NOTE — Telephone Encounter (Signed)
Spoke with patient and she is "feeling much better".  The hospice nurse told her to take Mucinex to help with the chest congestion and it has helped.

## 2022-09-08 NOTE — Telephone Encounter (Signed)
Left message on machine for patient to return our call 

## 2022-10-17 ENCOUNTER — Other Ambulatory Visit: Payer: Self-pay | Admitting: Internal Medicine

## 2022-10-17 DIAGNOSIS — E1151 Type 2 diabetes mellitus with diabetic peripheral angiopathy without gangrene: Secondary | ICD-10-CM

## 2022-10-25 DEATH — deceased

## 2022-10-26 ENCOUNTER — Ambulatory Visit: Payer: Medicare Other | Admitting: Hematology and Oncology
